# Patient Record
Sex: Male | Born: 1935
Health system: Southern US, Community
[De-identification: ages and names within clinical notes are randomized; demographics above are authoritative.]

## PROBLEM LIST (undated history)

## (undated) DIAGNOSIS — L0292 Furuncle, unspecified: Secondary | ICD-10-CM

## (undated) DIAGNOSIS — I509 Heart failure, unspecified: Secondary | ICD-10-CM

## (undated) DIAGNOSIS — D649 Anemia, unspecified: Secondary | ICD-10-CM

## (undated) DIAGNOSIS — H409 Unspecified glaucoma: Secondary | ICD-10-CM

## (undated) DIAGNOSIS — M795 Residual foreign body in soft tissue: Secondary | ICD-10-CM

## (undated) DIAGNOSIS — R739 Hyperglycemia, unspecified: Secondary | ICD-10-CM

## (undated) DIAGNOSIS — D702 Other drug-induced agranulocytosis: Secondary | ICD-10-CM

## (undated) DIAGNOSIS — N055 Unspecified nephritic syndrome with diffuse mesangiocapillary glomerulonephritis: Secondary | ICD-10-CM

## (undated) DIAGNOSIS — I10 Essential (primary) hypertension: Secondary | ICD-10-CM

## (undated) DIAGNOSIS — E039 Hypothyroidism, unspecified: Secondary | ICD-10-CM

## (undated) DIAGNOSIS — L0293 Carbuncle, unspecified: Secondary | ICD-10-CM

## (undated) DIAGNOSIS — E785 Hyperlipidemia, unspecified: Secondary | ICD-10-CM

## (undated) DIAGNOSIS — N183 Chronic kidney disease, stage 3 unspecified: Secondary | ICD-10-CM

## (undated) DIAGNOSIS — I2510 Atherosclerotic heart disease of native coronary artery without angina pectoris: Secondary | ICD-10-CM

## (undated) DIAGNOSIS — I776 Arteritis, unspecified: Secondary | ICD-10-CM

## (undated) DIAGNOSIS — K219 Gastro-esophageal reflux disease without esophagitis: Secondary | ICD-10-CM

## (undated) DIAGNOSIS — H209 Unspecified iridocyclitis: Secondary | ICD-10-CM

## (undated) DIAGNOSIS — I219 Acute myocardial infarction, unspecified: Secondary | ICD-10-CM

## (undated) DIAGNOSIS — J849 Interstitial pulmonary disease, unspecified: Secondary | ICD-10-CM

## (undated) DIAGNOSIS — J8489 Other specified interstitial pulmonary diseases: Secondary | ICD-10-CM

## (undated) DIAGNOSIS — C8330 Diffuse large B-cell lymphoma, unspecified site: Secondary | ICD-10-CM

## (undated) DIAGNOSIS — L089 Local infection of the skin and subcutaneous tissue, unspecified: Secondary | ICD-10-CM

## (undated) DIAGNOSIS — H269 Unspecified cataract: Secondary | ICD-10-CM

## (undated) DIAGNOSIS — K2270 Barrett's esophagus without dysplasia: Secondary | ICD-10-CM

## (undated) DIAGNOSIS — R319 Hematuria, unspecified: Secondary | ICD-10-CM

## (undated) HISTORY — DX: Atherosclerotic heart disease of native coronary artery without angina pectoris: I25.10

## (undated) HISTORY — DX: Diffuse large B-cell lymphoma, unspecified site: C83.30

## (undated) HISTORY — DX: Barrett's esophagus without dysplasia: K22.70

## (undated) HISTORY — DX: Other drug-induced agranulocytosis: D70.2

## (undated) HISTORY — DX: Unspecified nephritic syndrome with diffuse mesangiocapillary glomerulonephritis: N05.5

## (undated) HISTORY — DX: Anemia, unspecified: D64.9

## (undated) HISTORY — DX: Carbuncle, unspecified: L02.93

## (undated) HISTORY — PX: LUNG BIOPSY: SHX232

## (undated) HISTORY — PX: CATARACT EXTRACTION: SUR2

## (undated) HISTORY — PX: RENAL BIOPSY: SHX156

## (undated) HISTORY — DX: Unspecified cataract: H26.9

## (undated) HISTORY — DX: Arteritis, unspecified: I77.6

## (undated) HISTORY — DX: Hyperlipidemia, unspecified: E78.5

## (undated) HISTORY — DX: Essential (primary) hypertension: I10

## (undated) HISTORY — DX: Gastro-esophageal reflux disease without esophagitis: K21.9

## (undated) HISTORY — DX: Acute myocardial infarction, unspecified: I21.9

## (undated) HISTORY — DX: Hypothyroidism, unspecified: E03.9

## (undated) HISTORY — DX: Interstitial pulmonary disease, unspecified: J84.9

## (undated) HISTORY — DX: Local infection of the skin and subcutaneous tissue, unspecified: L08.9

## (undated) HISTORY — DX: Unspecified iridocyclitis: H20.9

## (undated) HISTORY — DX: Residual foreign body in soft tissue: M79.5

## (undated) HISTORY — DX: Furuncle, unspecified: L02.92

---

## 1994-12-06 ENCOUNTER — Encounter: Payer: Self-pay | Admitting: Family Medicine

## 2000-05-17 DIAGNOSIS — I219 Acute myocardial infarction, unspecified: Secondary | ICD-10-CM

## 2000-05-17 HISTORY — DX: Acute myocardial infarction, unspecified: I21.9

## 2000-05-17 HISTORY — PX: CORONARY ARTERY BYPASS GRAFT: SHX141

## 2000-05-22 ENCOUNTER — Inpatient Hospital Stay (HOSPITAL_COMMUNITY): Admission: EM | Admit: 2000-05-22 | Discharge: 2000-05-31 | Payer: Self-pay

## 2000-05-24 ENCOUNTER — Encounter: Payer: Self-pay | Admitting: Internal Medicine

## 2000-05-25 ENCOUNTER — Encounter: Payer: Self-pay | Admitting: Thoracic Surgery (Cardiothoracic Vascular Surgery)

## 2000-05-26 ENCOUNTER — Encounter: Payer: Self-pay | Admitting: Thoracic Surgery (Cardiothoracic Vascular Surgery)

## 2000-05-27 ENCOUNTER — Encounter: Payer: Self-pay | Admitting: Thoracic Surgery (Cardiothoracic Vascular Surgery)

## 2004-04-22 ENCOUNTER — Ambulatory Visit: Payer: Self-pay

## 2004-05-25 ENCOUNTER — Ambulatory Visit: Payer: Self-pay | Admitting: Cardiovascular Disease

## 2004-06-01 ENCOUNTER — Ambulatory Visit: Payer: Self-pay | Admitting: Cardiovascular Disease

## 2004-06-05 ENCOUNTER — Encounter: Admission: RE | Admit: 2004-06-05 | Discharge: 2004-06-05 | Payer: Self-pay | Admitting: Sports Medicine

## 2004-06-17 ENCOUNTER — Ambulatory Visit: Payer: Self-pay | Admitting: Family Medicine

## 2004-06-22 ENCOUNTER — Ambulatory Visit: Payer: Self-pay | Admitting: Family Medicine

## 2004-07-02 ENCOUNTER — Ambulatory Visit: Payer: Self-pay | Admitting: Family Medicine

## 2004-09-16 ENCOUNTER — Ambulatory Visit: Payer: Self-pay | Admitting: Family Medicine

## 2004-09-23 ENCOUNTER — Ambulatory Visit: Payer: Self-pay | Admitting: Family Medicine

## 2004-12-03 ENCOUNTER — Ambulatory Visit: Payer: Self-pay | Admitting: Cardiology

## 2005-06-04 ENCOUNTER — Ambulatory Visit: Payer: Self-pay | Admitting: Family Medicine

## 2005-06-11 ENCOUNTER — Ambulatory Visit: Payer: Self-pay | Admitting: Family Medicine

## 2005-09-08 ENCOUNTER — Ambulatory Visit: Payer: Self-pay | Admitting: Family Medicine

## 2006-05-26 ENCOUNTER — Ambulatory Visit: Payer: Self-pay | Admitting: Family Medicine

## 2006-06-02 ENCOUNTER — Ambulatory Visit: Payer: Self-pay | Admitting: Family Medicine

## 2006-06-02 LAB — CONVERTED CEMR LAB
ALT: 31 units/L (ref 0–40)
AST: 22 units/L (ref 0–37)
Alkaline Phosphatase: 62 units/L (ref 39–117)
Cholesterol: 153 mg/dL (ref 0–200)
Creatinine, Ser: 0.9 mg/dL (ref 0.4–1.5)
Glucose, Bld: 133 mg/dL — ABNORMAL HIGH (ref 70–99)
HCT: 46.9 % (ref 39.0–52.0)
Monocytes Relative: 8.1 % (ref 3.0–11.0)
Neutro Abs: 7.4 10*3/uL (ref 1.4–7.7)
Neutrophils Relative %: 73.7 % (ref 43.0–77.0)
RBC: 4.93 M/uL (ref 4.22–5.81)
Sodium: 139 meq/L (ref 135–145)
TSH: 2.73 microintl units/mL (ref 0.35–5.50)
WBC: 10 10*3/uL (ref 4.5–10.5)

## 2006-06-13 ENCOUNTER — Ambulatory Visit: Payer: Self-pay | Admitting: Family Medicine

## 2006-07-29 ENCOUNTER — Ambulatory Visit: Payer: Self-pay | Admitting: Cardiovascular Disease

## 2006-09-05 ENCOUNTER — Encounter: Admission: RE | Admit: 2006-09-05 | Discharge: 2006-09-05 | Payer: Self-pay | Admitting: Family Medicine

## 2006-09-05 ENCOUNTER — Ambulatory Visit: Payer: Self-pay | Admitting: Family Medicine

## 2006-09-07 ENCOUNTER — Ambulatory Visit: Payer: Self-pay | Admitting: Family Medicine

## 2006-09-07 LAB — CONVERTED CEMR LAB: Hgb A1c MFr Bld: 6 % (ref 4.6–6.0)

## 2006-09-26 ENCOUNTER — Ambulatory Visit: Payer: Self-pay | Admitting: Family Medicine

## 2006-09-26 ENCOUNTER — Ambulatory Visit: Payer: Self-pay | Admitting: Internal Medicine

## 2006-09-26 LAB — CONVERTED CEMR LAB: BUN: 23 mg/dL (ref 6–23)

## 2006-09-27 ENCOUNTER — Ambulatory Visit: Payer: Self-pay | Admitting: Critical Care Medicine

## 2006-09-27 LAB — CONVERTED CEMR LAB
Neutrophil cytoplasmic antibodies,IgG,serum: 1:320 {titer} — ABNORMAL HIGH
Rhuematoid fact SerPl-aCnc: <20 intl units/mL — ABNORMAL LOW (ref 0.0–20.0)

## 2006-10-03 ENCOUNTER — Encounter: Payer: Self-pay | Admitting: Critical Care Medicine

## 2006-10-03 ENCOUNTER — Ambulatory Visit: Payer: Self-pay | Admitting: Thoracic Surgery (Cardiothoracic Vascular Surgery)

## 2006-10-13 ENCOUNTER — Inpatient Hospital Stay (HOSPITAL_COMMUNITY)
Admission: RE | Admit: 2006-10-13 | Discharge: 2006-10-17 | Payer: Self-pay | Admitting: Thoracic Surgery (Cardiothoracic Vascular Surgery)

## 2006-10-13 ENCOUNTER — Encounter: Payer: Self-pay | Admitting: Thoracic Surgery (Cardiothoracic Vascular Surgery)

## 2006-10-13 ENCOUNTER — Ambulatory Visit: Payer: Self-pay | Admitting: Critical Care Medicine

## 2006-10-13 ENCOUNTER — Ambulatory Visit: Payer: Self-pay | Admitting: Thoracic Surgery (Cardiothoracic Vascular Surgery)

## 2006-10-21 ENCOUNTER — Ambulatory Visit: Payer: Self-pay | Admitting: Critical Care Medicine

## 2006-10-28 ENCOUNTER — Encounter: Payer: Self-pay | Admitting: Internal Medicine

## 2006-10-28 ENCOUNTER — Ambulatory Visit: Payer: Self-pay | Admitting: Cardiothoracic Surgery

## 2006-10-28 ENCOUNTER — Encounter
Admission: RE | Admit: 2006-10-28 | Discharge: 2006-10-28 | Payer: Self-pay | Admitting: Thoracic Surgery (Cardiothoracic Vascular Surgery)

## 2006-11-08 ENCOUNTER — Ambulatory Visit: Payer: Self-pay | Admitting: Pulmonary Disease

## 2006-11-09 ENCOUNTER — Ambulatory Visit: Payer: Self-pay | Admitting: Family Medicine

## 2006-11-09 ENCOUNTER — Encounter: Payer: Self-pay | Admitting: Internal Medicine

## 2006-11-09 ENCOUNTER — Inpatient Hospital Stay (HOSPITAL_COMMUNITY): Admission: EM | Admit: 2006-11-09 | Discharge: 2006-11-13 | Payer: Self-pay | Admitting: Emergency Medicine

## 2006-11-09 LAB — CONVERTED CEMR LAB
Alkaline Phosphatase: 43 units/L (ref 39–117)
BUN: 96 mg/dL (ref 6–23)
Basophils Absolute: 0 10*3/uL (ref 0.0–0.1)
Basophils Relative: 0.1 % (ref 0.0–1.0)
Bilirubin, Direct: 0.1 mg/dL (ref 0.0–0.3)
CO2: 26 meq/L (ref 19–32)
Calcium: 9 mg/dL (ref 8.4–10.5)
Eosinophils Relative: 0.5 % (ref 0.0–5.0)
GFR calc non Af Amer: 18 mL/min
HCT: 33.9 % — ABNORMAL LOW (ref 39.0–52.0)
Hemoglobin: 11.3 g/dL — ABNORMAL LOW (ref 13.0–17.0)
Hgb A1c MFr Bld: 7 % — ABNORMAL HIGH (ref 4.6–6.0)
Lymphocytes Relative: 11.9 % — ABNORMAL LOW (ref 12.0–46.0)
Neutro Abs: 13.6 10*3/uL — ABNORMAL HIGH (ref 1.4–7.7)
Platelets: 232 10*3/uL (ref 150–400)
RDW: 14.7 % — ABNORMAL HIGH (ref 11.5–14.6)
Total Bilirubin: 1 mg/dL (ref 0.3–1.2)

## 2006-11-10 ENCOUNTER — Encounter: Payer: Self-pay | Admitting: Internal Medicine

## 2006-11-10 ENCOUNTER — Ambulatory Visit: Payer: Self-pay | Admitting: Internal Medicine

## 2006-11-16 ENCOUNTER — Ambulatory Visit: Payer: Self-pay | Admitting: Family Medicine

## 2006-11-16 ENCOUNTER — Encounter: Payer: Self-pay | Admitting: Internal Medicine

## 2006-11-16 LAB — CONVERTED CEMR LAB
ALT: 40 units/L (ref 0–53)
AST: 23 units/L (ref 0–37)
Albumin: 3 g/dL — ABNORMAL LOW (ref 3.5–5.2)
BUN: 64 mg/dL — ABNORMAL HIGH (ref 6–23)
Basophils Absolute: 0 10*3/uL (ref 0.0–0.1)
Calcium: 8.9 mg/dL (ref 8.4–10.5)
Chloride: 108 meq/L (ref 96–112)
Cholesterol: 191 mg/dL (ref 0–200)
Eosinophils Absolute: 0.1 10*3/uL (ref 0.0–0.6)
GFR calc Af Amer: 28 mL/min
GFR calc non Af Amer: 23 mL/min
HCT: 31.8 % — ABNORMAL LOW (ref 39.0–52.0)
Hemoglobin: 10.7 g/dL — ABNORMAL LOW (ref 13.0–17.0)
LDL Cholesterol: 131 mg/dL — ABNORMAL HIGH (ref 0–99)
Lymphocytes Relative: 13 % (ref 12.0–46.0)
MCHC: 33.5 g/dL (ref 30.0–36.0)
MCV: 89.4 fL (ref 78.0–100.0)
Monocytes Relative: 7.6 % (ref 3.0–11.0)
Platelets: 152 10*3/uL (ref 150–400)
RBC: 3.56 M/uL — ABNORMAL LOW (ref 4.22–5.81)
VLDL: 18 mg/dL (ref 0–40)

## 2006-11-30 DIAGNOSIS — E039 Hypothyroidism, unspecified: Secondary | ICD-10-CM | POA: Insufficient documentation

## 2006-11-30 DIAGNOSIS — E119 Type 2 diabetes mellitus without complications: Secondary | ICD-10-CM

## 2006-12-05 ENCOUNTER — Ambulatory Visit: Payer: Self-pay | Admitting: Critical Care Medicine

## 2006-12-08 ENCOUNTER — Encounter (INDEPENDENT_AMBULATORY_CARE_PROVIDER_SITE_OTHER): Payer: Self-pay | Admitting: Nephrology

## 2006-12-08 ENCOUNTER — Ambulatory Visit (HOSPITAL_COMMUNITY): Admission: RE | Admit: 2006-12-08 | Discharge: 2006-12-09 | Payer: Self-pay | Admitting: Nephrology

## 2006-12-28 ENCOUNTER — Telehealth: Payer: Self-pay | Admitting: Family Medicine

## 2007-01-05 ENCOUNTER — Ambulatory Visit: Payer: Self-pay | Admitting: Internal Medicine

## 2007-01-19 ENCOUNTER — Encounter: Payer: Self-pay | Admitting: Family Medicine

## 2007-01-26 ENCOUNTER — Ambulatory Visit: Payer: Self-pay | Admitting: Family Medicine

## 2007-01-26 DIAGNOSIS — G2581 Restless legs syndrome: Secondary | ICD-10-CM

## 2007-01-26 DIAGNOSIS — N035 Chronic nephritic syndrome with diffuse mesangiocapillary glomerulonephritis: Secondary | ICD-10-CM

## 2007-01-27 ENCOUNTER — Telehealth: Payer: Self-pay | Admitting: Family Medicine

## 2007-02-02 ENCOUNTER — Ambulatory Visit: Payer: Self-pay | Admitting: Critical Care Medicine

## 2007-02-05 DIAGNOSIS — J8410 Pulmonary fibrosis, unspecified: Secondary | ICD-10-CM | POA: Insufficient documentation

## 2007-02-23 ENCOUNTER — Encounter: Payer: Self-pay | Admitting: Family Medicine

## 2007-02-27 ENCOUNTER — Telehealth (INDEPENDENT_AMBULATORY_CARE_PROVIDER_SITE_OTHER): Payer: Self-pay | Admitting: *Deleted

## 2007-03-02 ENCOUNTER — Ambulatory Visit: Payer: Self-pay | Admitting: Family Medicine

## 2007-03-02 ENCOUNTER — Telehealth (INDEPENDENT_AMBULATORY_CARE_PROVIDER_SITE_OTHER): Payer: Self-pay | Admitting: *Deleted

## 2007-04-18 ENCOUNTER — Ambulatory Visit: Payer: Self-pay | Admitting: Critical Care Medicine

## 2007-04-19 ENCOUNTER — Encounter: Payer: Self-pay | Admitting: Critical Care Medicine

## 2007-04-24 ENCOUNTER — Ambulatory Visit: Payer: Self-pay | Admitting: Family Medicine

## 2007-04-24 DIAGNOSIS — M795 Residual foreign body in soft tissue: Secondary | ICD-10-CM

## 2007-05-08 ENCOUNTER — Encounter: Payer: Self-pay | Admitting: Family Medicine

## 2007-05-08 ENCOUNTER — Encounter: Payer: Self-pay | Admitting: Critical Care Medicine

## 2007-05-18 HISTORY — PX: TOE AMPUTATION: SHX809

## 2007-05-23 ENCOUNTER — Ambulatory Visit: Payer: Self-pay | Admitting: Family Medicine

## 2007-05-23 DIAGNOSIS — I10 Essential (primary) hypertension: Secondary | ICD-10-CM

## 2007-05-24 ENCOUNTER — Telehealth (INDEPENDENT_AMBULATORY_CARE_PROVIDER_SITE_OTHER): Payer: Self-pay | Admitting: *Deleted

## 2007-05-25 ENCOUNTER — Telehealth: Payer: Self-pay | Admitting: Family Medicine

## 2007-06-08 ENCOUNTER — Encounter: Payer: Self-pay | Admitting: Critical Care Medicine

## 2007-06-20 ENCOUNTER — Ambulatory Visit: Payer: Self-pay | Admitting: Gastroenterology

## 2007-06-27 ENCOUNTER — Telehealth (INDEPENDENT_AMBULATORY_CARE_PROVIDER_SITE_OTHER): Payer: Self-pay | Admitting: *Deleted

## 2007-07-20 ENCOUNTER — Ambulatory Visit: Payer: Self-pay | Admitting: Family Medicine

## 2007-07-20 DIAGNOSIS — E785 Hyperlipidemia, unspecified: Secondary | ICD-10-CM

## 2007-07-20 LAB — CONVERTED CEMR LAB
Cholesterol: 130 mg/dL (ref 0–200)
HDL: 33.5 mg/dL — ABNORMAL LOW (ref >39.0)
LDL Cholesterol: 78 mg/dL (ref 0–99)

## 2007-08-03 ENCOUNTER — Encounter: Payer: Self-pay | Admitting: Critical Care Medicine

## 2007-08-08 ENCOUNTER — Ambulatory Visit: Payer: Self-pay

## 2007-08-08 ENCOUNTER — Ambulatory Visit: Payer: Self-pay | Admitting: Cardiovascular Disease

## 2007-10-30 ENCOUNTER — Ambulatory Visit: Payer: Self-pay | Admitting: Internal Medicine

## 2007-10-30 ENCOUNTER — Telehealth (INDEPENDENT_AMBULATORY_CARE_PROVIDER_SITE_OTHER): Payer: Self-pay | Admitting: *Deleted

## 2007-11-03 ENCOUNTER — Encounter: Payer: Self-pay | Admitting: Critical Care Medicine

## 2007-11-03 ENCOUNTER — Ambulatory Visit: Payer: Self-pay | Admitting: Internal Medicine

## 2007-11-06 ENCOUNTER — Ambulatory Visit: Payer: Self-pay | Admitting: Internal Medicine

## 2007-11-06 ENCOUNTER — Telehealth (INDEPENDENT_AMBULATORY_CARE_PROVIDER_SITE_OTHER): Payer: Self-pay | Admitting: *Deleted

## 2007-11-06 LAB — CONVERTED CEMR LAB
ALT: 19 units/L (ref 0–53)
Alkaline Phosphatase: 61 units/L (ref 39–117)
BUN: 33 mg/dL — ABNORMAL HIGH (ref 6–23)
Bilirubin, Direct: 0.2 mg/dL (ref 0.0–0.3)
Chloride: 104 meq/L (ref 96–112)
Creatinine, Ser: 2.4 mg/dL — ABNORMAL HIGH (ref 0.4–1.5)
Crystals: NEGATIVE
Glucose, Bld: 121 mg/dL — ABNORMAL HIGH (ref 70–99)
Ketones, ur: NEGATIVE mg/dL
Mucus, UA: NEGATIVE
Phosphorus: 4.5 mg/dL (ref 2.3–4.6)
Potassium: 4.1 meq/L (ref 3.5–5.1)
Specific Gravity, Urine: 1.02 (ref 1.000–1.03)
Total Bilirubin: 1.2 mg/dL (ref 0.3–1.2)
Total Protein, Urine: 100 mg/dL — AB
Urine Glucose: NEGATIVE mg/dL
Urobilinogen, UA: 0.2 (ref 0.0–1.0)
pH: 6 (ref 5.0–8.0)

## 2007-11-07 ENCOUNTER — Telehealth: Payer: Self-pay | Admitting: Internal Medicine

## 2007-11-07 ENCOUNTER — Telehealth: Payer: Self-pay | Admitting: Critical Care Medicine

## 2007-11-08 ENCOUNTER — Encounter: Payer: Self-pay | Admitting: Critical Care Medicine

## 2007-11-13 ENCOUNTER — Ambulatory Visit: Payer: Self-pay | Admitting: Critical Care Medicine

## 2007-11-14 ENCOUNTER — Telehealth (INDEPENDENT_AMBULATORY_CARE_PROVIDER_SITE_OTHER): Payer: Self-pay | Admitting: *Deleted

## 2007-11-14 ENCOUNTER — Encounter: Payer: Self-pay | Admitting: Critical Care Medicine

## 2007-11-15 ENCOUNTER — Ambulatory Visit (HOSPITAL_COMMUNITY): Admission: RE | Admit: 2007-11-15 | Discharge: 2007-11-15 | Payer: Self-pay | Admitting: Nephrology

## 2007-11-15 ENCOUNTER — Encounter (HOSPITAL_COMMUNITY): Admission: RE | Admit: 2007-11-15 | Discharge: 2008-01-17 | Payer: Self-pay | Admitting: Nephrology

## 2007-11-16 ENCOUNTER — Telehealth: Payer: Self-pay | Admitting: Critical Care Medicine

## 2007-11-28 ENCOUNTER — Encounter: Payer: Self-pay | Admitting: Critical Care Medicine

## 2007-11-29 ENCOUNTER — Ambulatory Visit (HOSPITAL_COMMUNITY): Admission: RE | Admit: 2007-11-29 | Discharge: 2007-11-29 | Payer: Self-pay | Admitting: Nephrology

## 2007-12-07 ENCOUNTER — Ambulatory Visit: Payer: Self-pay | Admitting: Critical Care Medicine

## 2007-12-08 LAB — CONVERTED CEMR LAB
BUN: 50 mg/dL — ABNORMAL HIGH (ref 6–23)
Basophils Relative: 0.3 % (ref 0.0–3.0)
Calcium: 8.4 mg/dL (ref 8.4–10.5)
Creatinine, Ser: 2.2 mg/dL — ABNORMAL HIGH (ref 0.4–1.5)
Eosinophils Absolute: 0 10*3/uL (ref 0.0–0.7)
Eosinophils Relative: 1.2 % (ref 0.0–5.0)
GFR calc Af Amer: 38 mL/min
GFR calc non Af Amer: 32 mL/min
HCT: 25.8 % — ABNORMAL LOW (ref 39.0–52.0)
MCHC: 34.4 g/dL (ref 30.0–36.0)
MCV: 109.8 fL — ABNORMAL HIGH (ref 78.0–100.0)
Monocytes Absolute: 0.1 10*3/uL (ref 0.1–1.0)
RBC: 2.35 M/uL — ABNORMAL LOW (ref 4.22–5.81)
WBC: 1.5 10*3/uL — CL (ref 4.5–10.5)

## 2007-12-11 ENCOUNTER — Encounter: Payer: Self-pay | Admitting: Critical Care Medicine

## 2007-12-12 ENCOUNTER — Encounter: Payer: Self-pay | Admitting: Critical Care Medicine

## 2007-12-15 ENCOUNTER — Encounter: Payer: Self-pay | Admitting: Critical Care Medicine

## 2007-12-26 ENCOUNTER — Encounter: Payer: Self-pay | Admitting: Critical Care Medicine

## 2007-12-27 ENCOUNTER — Ambulatory Visit: Payer: Self-pay | Admitting: Critical Care Medicine

## 2007-12-27 ENCOUNTER — Encounter: Payer: Self-pay | Admitting: Critical Care Medicine

## 2007-12-29 ENCOUNTER — Encounter: Payer: Self-pay | Admitting: Critical Care Medicine

## 2008-01-01 ENCOUNTER — Encounter: Payer: Self-pay | Admitting: Critical Care Medicine

## 2008-01-11 ENCOUNTER — Ambulatory Visit: Payer: Self-pay | Admitting: Critical Care Medicine

## 2008-01-29 ENCOUNTER — Encounter: Payer: Self-pay | Admitting: Critical Care Medicine

## 2008-02-28 ENCOUNTER — Encounter: Payer: Self-pay | Admitting: Critical Care Medicine

## 2008-03-04 ENCOUNTER — Ambulatory Visit: Payer: Self-pay | Admitting: Critical Care Medicine

## 2008-03-22 ENCOUNTER — Telehealth: Payer: Self-pay | Admitting: Gastroenterology

## 2008-03-22 ENCOUNTER — Encounter: Payer: Self-pay | Admitting: Physician Assistant

## 2008-03-22 ENCOUNTER — Encounter: Admission: RE | Admit: 2008-03-22 | Discharge: 2008-03-22 | Payer: Self-pay | Admitting: Nephrology

## 2008-03-25 ENCOUNTER — Telehealth: Payer: Self-pay | Admitting: Gastroenterology

## 2008-03-27 ENCOUNTER — Ambulatory Visit: Payer: Self-pay | Admitting: Gastroenterology

## 2008-03-28 ENCOUNTER — Encounter: Payer: Self-pay | Admitting: Gastroenterology

## 2008-03-28 ENCOUNTER — Ambulatory Visit: Payer: Self-pay | Admitting: Gastroenterology

## 2008-03-28 ENCOUNTER — Ambulatory Visit (HOSPITAL_COMMUNITY): Admission: RE | Admit: 2008-03-28 | Discharge: 2008-03-28 | Payer: Self-pay | Admitting: Gastroenterology

## 2008-04-01 ENCOUNTER — Ambulatory Visit: Payer: Self-pay | Admitting: Family Medicine

## 2008-04-01 ENCOUNTER — Encounter: Payer: Self-pay | Admitting: Gastroenterology

## 2008-04-02 ENCOUNTER — Encounter: Payer: Self-pay | Admitting: Family Medicine

## 2008-04-04 ENCOUNTER — Encounter: Payer: Self-pay | Admitting: Internal Medicine

## 2008-04-04 ENCOUNTER — Telehealth: Payer: Self-pay | Admitting: Family Medicine

## 2008-04-10 ENCOUNTER — Encounter: Payer: Self-pay | Admitting: Internal Medicine

## 2008-04-15 ENCOUNTER — Ambulatory Visit: Payer: Self-pay | Admitting: Gastroenterology

## 2008-04-15 DIAGNOSIS — K219 Gastro-esophageal reflux disease without esophagitis: Secondary | ICD-10-CM

## 2008-04-15 DIAGNOSIS — K2270 Barrett's esophagus without dysplasia: Secondary | ICD-10-CM

## 2008-05-07 ENCOUNTER — Encounter: Payer: Self-pay | Admitting: Gastroenterology

## 2008-05-24 ENCOUNTER — Telehealth: Payer: Self-pay | Admitting: Family Medicine

## 2008-06-05 ENCOUNTER — Encounter: Payer: Self-pay | Admitting: Gastroenterology

## 2008-06-10 ENCOUNTER — Encounter: Payer: Self-pay | Admitting: Gastroenterology

## 2008-07-17 ENCOUNTER — Encounter: Payer: Self-pay | Admitting: Gastroenterology

## 2008-07-23 ENCOUNTER — Encounter: Payer: Self-pay | Admitting: Cardiovascular Disease

## 2008-07-23 ENCOUNTER — Ambulatory Visit: Payer: Self-pay | Admitting: Cardiovascular Disease

## 2008-07-23 DIAGNOSIS — I2510 Atherosclerotic heart disease of native coronary artery without angina pectoris: Secondary | ICD-10-CM

## 2008-07-30 ENCOUNTER — Encounter: Payer: Self-pay | Admitting: Gastroenterology

## 2008-09-09 ENCOUNTER — Encounter: Payer: Self-pay | Admitting: Gastroenterology

## 2008-09-09 ENCOUNTER — Encounter: Payer: Self-pay | Admitting: Internal Medicine

## 2008-10-23 ENCOUNTER — Encounter: Payer: Self-pay | Admitting: Critical Care Medicine

## 2008-10-31 ENCOUNTER — Encounter: Payer: Self-pay | Admitting: Critical Care Medicine

## 2008-12-04 DIAGNOSIS — H209 Unspecified iridocyclitis: Secondary | ICD-10-CM

## 2008-12-04 DIAGNOSIS — D649 Anemia, unspecified: Secondary | ICD-10-CM

## 2008-12-04 DIAGNOSIS — J218 Acute bronchiolitis due to other specified organisms: Secondary | ICD-10-CM | POA: Insufficient documentation

## 2008-12-04 DIAGNOSIS — E7800 Pure hypercholesterolemia, unspecified: Secondary | ICD-10-CM | POA: Insufficient documentation

## 2008-12-04 DIAGNOSIS — N259 Disorder resulting from impaired renal tubular function, unspecified: Secondary | ICD-10-CM | POA: Insufficient documentation

## 2008-12-05 ENCOUNTER — Ambulatory Visit: Payer: Self-pay | Admitting: Cardiovascular Disease

## 2008-12-09 ENCOUNTER — Encounter: Payer: Self-pay | Admitting: Critical Care Medicine

## 2008-12-31 ENCOUNTER — Ambulatory Visit: Payer: Self-pay | Admitting: Critical Care Medicine

## 2009-01-06 ENCOUNTER — Encounter: Payer: Self-pay | Admitting: Critical Care Medicine

## 2009-02-03 ENCOUNTER — Ambulatory Visit: Payer: Self-pay | Admitting: Critical Care Medicine

## 2009-02-05 ENCOUNTER — Encounter: Payer: Self-pay | Admitting: Critical Care Medicine

## 2009-02-18 ENCOUNTER — Encounter (INDEPENDENT_AMBULATORY_CARE_PROVIDER_SITE_OTHER): Payer: Self-pay | Admitting: *Deleted

## 2009-03-05 ENCOUNTER — Encounter: Payer: Self-pay | Admitting: Critical Care Medicine

## 2009-03-12 ENCOUNTER — Encounter: Payer: Self-pay | Admitting: Critical Care Medicine

## 2009-04-02 ENCOUNTER — Encounter: Payer: Self-pay | Admitting: Critical Care Medicine

## 2009-04-09 ENCOUNTER — Encounter: Payer: Self-pay | Admitting: Critical Care Medicine

## 2009-05-19 ENCOUNTER — Ambulatory Visit: Payer: Self-pay | Admitting: Cardiovascular Disease

## 2009-06-05 ENCOUNTER — Encounter: Payer: Self-pay | Admitting: Cardiovascular Disease

## 2009-06-23 ENCOUNTER — Encounter: Payer: Self-pay | Admitting: Gastroenterology

## 2009-06-23 ENCOUNTER — Encounter: Payer: Self-pay | Admitting: Critical Care Medicine

## 2009-08-12 ENCOUNTER — Telehealth (INDEPENDENT_AMBULATORY_CARE_PROVIDER_SITE_OTHER): Payer: Self-pay | Admitting: *Deleted

## 2009-11-21 ENCOUNTER — Ambulatory Visit: Payer: Self-pay | Admitting: Cardiovascular Disease

## 2009-11-27 ENCOUNTER — Telehealth: Payer: Self-pay | Admitting: Gastroenterology

## 2010-01-27 ENCOUNTER — Ambulatory Visit: Payer: Self-pay | Admitting: Gastroenterology

## 2010-01-27 ENCOUNTER — Encounter (INDEPENDENT_AMBULATORY_CARE_PROVIDER_SITE_OTHER): Payer: Self-pay | Admitting: *Deleted

## 2010-05-26 ENCOUNTER — Ambulatory Visit
Admission: RE | Admit: 2010-05-26 | Discharge: 2010-05-26 | Payer: Self-pay | Source: Home / Self Care | Attending: Cardiovascular Disease | Admitting: Cardiovascular Disease

## 2010-05-26 ENCOUNTER — Encounter: Payer: Self-pay | Admitting: Cardiovascular Disease

## 2010-06-14 LAB — CONVERTED CEMR LAB
ALT: 21 units/L (ref 0–53)
AST: 25 units/L (ref 0–37)
Alkaline Phosphatase: 58 units/L (ref 39–117)
BUN: 23 mg/dL (ref 6–23)
Basophils Relative: 0.7 % (ref 0.0–1.0)
Bilirubin, Direct: 0.2 mg/dL (ref 0.0–0.3)
Calcium: 9.2 mg/dL (ref 8.4–10.5)
Chloride: 102 meq/L (ref 96–112)
Cholesterol: 280 mg/dL (ref 0–200)
Direct LDL: 221.7 mg/dL
Eosinophils Absolute: 0.1 10*3/uL (ref 0.0–0.6)
Eosinophils Relative: 2.3 % (ref 0.0–5.0)
GFR calc Af Amer: 60 mL/min
GFR calc non Af Amer: 49 mL/min
Glucose, Bld: 89 mg/dL (ref 70–99)
HDL: 43.9 mg/dL (ref >39.0)
MCV: 115.6 fL — ABNORMAL HIGH (ref 78.0–100.0)
Microalb Creat Ratio: 202.2 mg/g — ABNORMAL HIGH (ref 0.0–30.0)
Monocytes Relative: 15.6 % — ABNORMAL HIGH (ref 3.0–11.0)
Neutro Abs: 3.7 10*3/uL (ref 1.4–7.7)
PSA: 3.25 ng/mL (ref 0.10–4.00)
Platelets: 185 10*3/uL (ref 150–400)
RBC: 3.06 M/uL — ABNORMAL LOW (ref 4.22–5.81)
TSH: 1.62 microintl units/mL (ref 0.35–5.50)
Triglycerides: 108 mg/dL (ref 0–149)
VLDL: 22 mg/dL (ref 0–40)
WBC: 4.9 10*3/uL (ref 4.5–10.5)

## 2010-06-16 NOTE — Assessment & Plan Note (Signed)
Summary: f38m/dm   Referring Provider:  Lissa Hoard Renal Primary Provider:  Stevie Kern, MD  CC:  6 month check up.  History of Present Illness: Luke Mcbride is seen today in followup for his coronary artery disease, hypertension, and dyspnea.  He is S/P coronary bypass surgery in 2002.  His last Myoview study was done 3/24/ 2009.  He had a high lateral infarct but no ischemia and an EF of 47%.  He is back on baby aspirin.  He has had significant vasculitis involving his lugns and kidneys.    [name]  His dyspnea seems to be improving he is coughing less.  He had significant groundglass appearance to his lungs on CT scan.  Kidney function appears to have stabilized with a creatinine in the mid-2 range. He tells me it is down to 1.6 range now.    He was on Cytoxan for a while.  Feels much better on Imuran.  Not having any significant chest pain PND orthopnea he has trace lower extremity edema.  No diaphoresis or syncope.  Has had some visual problems with his eyes and is now off Diamox.  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  He sees Dr Edilia Bo at [place].  Current Problems (verified): 1)  Dyspnea  (ICD-786.05) 2)  Essential Hypertension, Benign  (ICD-401.1) 3)  Cad  (ICD-414.00) 4)  Hyperlipidemia  (ICD-272.4) 5)  Bronchiolitis  (ICD-466.19) 6)  Renal Insufficiency  (ICD-588.9) 7)  Iritis  (ICD-364.3) 8)  Hypercholesterolemia  (ICD-272.0) 9)  Gerd  (ICD-530.81) 10)  Barrett's Esophagus  (ICD-530.85) 11)  Unspec Local Infection Skin&subcutaneous Tissue  (ICD-686.9) 12)  Boils, Recurrent  (ICD-680.9) 13)  Abdominal Pain-epigastric  (ICD-789.06) 14)  Nausea  (ICD-787.02) 15)  Epigastric Pain  (ICD-789.06) 16)  Neutropenia, Drug-induced  (ICD-288.03) 17)  Vasculitis  (ICD-447.6) 18)  Family History Diabetes 1st Degree Relative  (ICD-V18.0) 76)  Family History Depression  (ICD-V17.0) 20)  Foreign Body, Soft Tissue, Residual   (ICD-729.6) 21)  Interstitial Lung Disease  (ICD-515) 22)  Restless Leg Syndrome, Severe  (ICD-333.94) 23)  Nephritis, Membranoproliferative  (ICD-582.2) 24)  Hypothyroidism  (ICD-244.9) 25)  Diabetes Mellitus, Type II  (ICD-250.00) 26)  Allergic Rhinitis  (ICD-477.9) 27)  Anemia  (ICD-285.9)  Current Medications (verified): 1)  Procrit 10000 Unit/ml  Soln (Epoetin Alfa) .... Monthly 2)  Nitrostat 0.4 Mg  Subl (Nitroglycerin) .... One Sl Prn 3)  Micardis 20 Mg Tabs (Telmisartan) .[name].. 1 Tab By Mouth Once Daily 4)  Azathioprine 50 Mg Tabs (Azathioprine) .... 2 Morning 1 Afternoon 5)  Zegerid 20-1100 Mg Caps (Omeprazole-Sodium Bicarbonate) .[name].. 1 Tab By Mouth Once Daily As Needed 6)  Multivitamins   Tabs (Multiple Vitamin) .[name].. 1 Tab By Mouth Once Daily 7)  Levoxyl 100 Mcg Tabs (Levothyroxine Sodium) .[name].. 1 By Mouth Daily 8)  Aspirin 81 Mg Tbec (Aspirin) .... Take One Tablet By Mouth Daily  Allergies (verified): No Known Drug Allergies  Past History:  Past Medical History: Last updated: 12/04/2008 Current Problems:  ESSENTIAL HYPERTENSION, BENIGN (ICD-401.1) CAD (ICD-414.00) HYPERLIPIDEMIA (ICD-272.4) BRONCHIOLITIS (ICD-466.19) RENAL INSUFFICIENCY (ICD-588.9) IRITIS (ICD-364.3) HYPERCHOLESTEROLEMIA (ICD-272.0) GERD (ICD-530.81) BARRETT'S ESOPHAGUS (ICD-530.85) UNSPEC LOCAL INFECTION SKIN&SUBCUTANEOUS TISSUE (ICD-686.9) BOILS, RECURRENT (ICD-680.9) ABDOMINAL PAIN-EPIGASTRIC (ICD-789.06) NAUSEA (ICD-787.02) EPIGASTRIC PAIN (ICD-789.06) NEUTROPENIA, DRUG-INDUCED (ICD-288.03) VASCULITIS (ICD-447.6) FAMILY HISTORY DIABETES 1ST DEGREE RELATIVE (ICD-V18.0) FAMILY HISTORY DEPRESSION (ICD-V17.0) FOREIGN BODY, SOFT TISSUE, RESIDUAL (ICD-729.6) RESTLESS LEG SYNDROME, SEVERE (ICD-333.94) NEPHRITIS, MEMBRANOPROLIFERATIVE (ICD-582.2) HYPOTHYROIDISM (ICD-244.9) DIABETES MELLITUS, TYPE  II (ICD-250.00) ALLERGIC RHINITIS (ICD-477.9) ANEMIA (ICD-285.9) INTERSTITIAL LUNG DISEASE  (ICD-515):         - 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 Married Never Smoked Alcohol use-yes Drug use-no Regular exercise-yes  Review of Systems       Denies fever, malais, weight loss,  decreased visual acuity, cough, sputum, SOB, hemoptysis, pleuritic pain, palpitaitons, heartburn, abdominal pain, melena, lower extremity edema, claudication, or rash. All other systems reviewed and negative except as noted in HPI  Vital Signs:  Patient profile:   75 year old male Height:      72 inches Weight:      236 pounds BMI:     32.12 Pulse rate:   70 / minute Resp:     12 per minute BP sitting:   124 / 82  (left arm)  Vitals Entered By: Burnett Kanaris (May 19, 2009 9:06 AM)  Physical Exam  General:  Affect appropriate Healthy:  appears stated age 65: normal Neck supple with no adenopathy JVP normal no bruits no thyromegaly Lungs clear with no wheezing and good diaphragmatic motion Heart:  S1/S2 no murmur,rub, gallop or click PMI normal Abdomen: benighn, BS positve, no tenderness, no AAA no bruit.  No HSM or HJR Distal pulses intact with no bruits No edema Neuro non-focal Skin warm and dry Ptosis left eye   Impression & Recommendations:  Problem # 1:  CAD (ICD-414.00) Cabg in 2002 non-ischemic myovue in 2009.  Medical Rx His updated medication list for this problem includes:    Nitrostat 0.4 Mg Subl (Nitroglycerin) ..... One sl prn    Aspirin 81 Mg Tbec (Aspirin) .[name]... Take one tablet by mouth  daily  Problem # 2:  DYSPNEA (ICD-786.05) Improved.  F/U Dr Joya Gaskins for vasculitic lung disease.  Clear exam today The following medications were removed from the medication list:    Acetazolamide Sodium 500 Mg Solr (Acetazolamide sodium) .[name]... 1 tab by mouth two times a day His updated medication list for this problem includes:    Micardis 20 Mg Tabs (Telmisartan) .[name]... 1 tab by mouth once daily    Aspirin 81 Mg Tbec (Aspirin) .[name]... Take one tablet by mouth daily  Problem # 3:  HYPERLIPIDEMIA (ICD-272.4) Check labs at renal office.  Likely restart niacin and lipitor.  LDL was 76 on statin previously.  Problem # 4:  ESSENTIAL HYPERTENSION, BENIGN (ICD-401.1) Well controlled continue meds including ARB The following medications were removed from the medication list:    Acetazolamide Sodium 500 Mg Solr (Acetazolamide sodium) .[name]... 1 tab by mouth two times a day His updated medication list for this problem includes:    Micardis 20 Mg Tabs (Telmisartan) .[name]... 1 tab by mouth once daily    Aspirin 81 Mg Tbec (Aspirin) .[name]... Take one tablet by mouth daily  Patient Instructions: 1)  Your physician recommends that you schedule a follow-up appointment in: 6 months

## 2010-06-16 NOTE — Progress Notes (Signed)
Summary: Schedule Office Visit  ---- Converted from flag ---- ---- 11/27/2009 7:29 AM, Milus Banister MD wrote: yes, rov first  ---- 11/26/2009 3:40 PM, Bernita Buffy CMA (AAMA) wrote: This is part of the recall project  In [place] and the paper chart there are several office visit recalls that were due before the EGD recall that the patient never made. Do you wish to see the patient in the office in the office before his egd is scheduled or do you want to just schedule him for a direct egd? I have put the paper chart on your desk also.   Mearl Latin ------------------------------  Phone Note Outgoing Call Call back at [facility] Phone [phone]   Call placed by: Bernita Buffy CMA Deborra Medina),  November 27, 2009 9:34 AM Call placed to: Patient Summary of Call: rev scheduled for 01/27/2010 Initial call taken by: Bernita Buffy CMA ([place]),  November 27, 2009 9:35 AM

## 2010-06-16 NOTE — Assessment & Plan Note (Signed)
Review of gastrointestinal problems: 1. Reflux esophagitis, documented November, 2009. 2. Barrett's esophagus, documented November 2009 by EGD. Biopsies confirmed no dysplasia. Recall EGD November 2010.   History of Present Illness Visit Type: Follow-up Visit Primary GI MD: Owens Loffler MD Primary Provider: Stevie Kern, MD Chief Complaint: follow-up/consult EGD History of Present Illness:     very pleasant 75 year old man whom I last saw about 2 years ago. He was supposed to have a repeat EGD for Barrett's surveillance almost one year ago however that did not happen for unclear reasons.  who has had no dysphagia.  Stable weight.  No nausea, vomiting.  He takes zegerid only as needed,   This is about 2-3 times a month.           Current Medications (verified): 1)  Nitrostat 0.4 Mg  Subl (Nitroglycerin) .... One Sl Prn 2)  Micardis 20 Mg Tabs (Telmisartan) .[name].. 1 Tab By Mouth Once Daily 3)  Azathioprine 50 Mg Tabs (Azathioprine) .... 2 Morning 1 Afternoon 4)  Zegerid 20-1100 Mg Caps (Omeprazole-Sodium Bicarbonate) .[name].. 1 Tab By Mouth Once Daily As Needed 5)  Multivitamins   Tabs (Multiple Vitamin) .[name].. 1 Tab By Mouth Once Daily 6)  Levoxyl 100 Mcg Tabs (Levothyroxine Sodium) .[name].. 1 By Mouth Daily 7)  Aspirin 81 Mg Tbec (Aspirin) .... Take One Tablet By Mouth Daily 8)  Crestor 10 Mg Tabs (Rosuvastatin Calcium) .... Take One Tablet By Mouth Daily. 9)  Rapaflo 8 Mg Caps (Silodosin) .... Once Daily  Allergies (verified): No Known Drug Allergies  Past History:  Past Medical History: Current Problems:  ESSENTIAL HYPERTENSION, BENIGN (ICD-401.1) CAD (ICD-414.00) HYPERLIPIDEMIA (ICD-272.4) BRONCHIOLITIS (ICD-466.19) RENAL INSUFFICIENCY (ICD-588.9) IRITIS (ICD-364.3) HYPERCHOLESTEROLEMIA (ICD-272.0) GERD (ICD-530.81) BARRETT'S ESOPHAGUS (ICD-530.85) UNSPEC LOCAL INFECTION SKIN&SUBCUTANEOUS TISSUE (ICD-686.9) BOILS, RECURRENT (ICD-680.9) ABDOMINAL PAIN-EPIGASTRIC  (ICD-789.06) NAUSEA (ICD-787.02) EPIGASTRIC PAIN (ICD-789.06) NEUTROPENIA, DRUG-INDUCED (ICD-288.03) VASCULITIS (ICD-447.6) FAMILY HISTORY DIABETES 1ST DEGREE RELATIVE (ICD-V18.0) FAMILY HISTORY DEPRESSION (ICD-V17.0) FOREIGN BODY, SOFT TISSUE, RESIDUAL (ICD-729.6) RESTLESS LEG SYNDROME, SEVERE (ICD-333.94) NEPHRITIS, MEMBRANOPROLIFERATIVE (ICD-582.2) HYPOTHYROIDISM (ICD-244.9) DIABETES MELLITUS, TYPE II (ICD-250.00) ALLERGIC RHINITIS (ICD-477.9) ANEMIA (ICD-285.9) INTERSTITIAL LUNG DISEASE (ICD-515):         - vasculitis with pauci immune vasculits dx 2008 with renal bx., hx of BOOP on Olbx 2008,  now pulmonary          vasculits with pos p anca  Chronic left eye problems       Past Surgical History: Coronary artery bypass graft 2002 Cataract extraction   Vital Signs:  Patient profile:   75 year old male Height:      72 inches Weight:      242.38 pounds BMI:     32.99 Pulse rate:   80 / minute Pulse rhythm:   regular BP sitting:   126 / 84  (left arm)  Vitals Entered By: Randye Lobo NCMA (January 27, 2010 8:47 AM)  Physical Exam  Additional Exam:  Constitutional: generally well appearing Psychiatric: alert and oriented times 3 Abdomen: soft, non-tender, non-distended, normal bowel sounds    Impression & Recommendations:  Problem # 1:  Barrett's esophagus, history of esophagitis first I recommended that he change his proton pump inhibitor so that he takes on a daily basis rather than just as needed. I think he is having relatively asymptomatic GERD that is causing significant enough damage to his esophagus to create Barrett's changes, esophagitis. He is due for Barrett's surveillance and we will schedule an EGD for him at his soonest convenience. He has no alarm  symptoms and so I think it is unlikely that he has progressed to cancer.  Patient Instructions: 1)  You will be scheduled to have an upper endoscopy. 2)  You should change the way you are taking your  antiacid medicine (zegeride) so that you are taking it 20-30 minutes prior to a decent meal as that is the way the pill is designed to work most effectively. 3)  The medication list was reviewed and reconciled.  All changed / newly prescribed medications were explained.  A complete medication list was provided to the patient / caregiver.  Appended Document: Orders Update/EGD    Clinical Lists Changes  Orders: Added new Test order of EGD (EGD) - Signed

## 2010-06-16 NOTE — Letter (Signed)
Summary: [facility]   Imported By: Phillis Knack 07/16/2009 11:25:09  _____________________________________________________________________  External Attachment:    Type:   Image     Comment:   External Document

## 2010-06-16 NOTE — Letter (Signed)
Summary: EGD Instructions  [facility]  [place], [place] [address]   Phone: [phone]  Fax: [phone]       OMAR LICON    [birth year]    MRN: [id number]       Procedure Day /Date:02/23/10 MON     Arrival Time: 1 pm     Procedure Time:2 pm     Location of Procedure:                    [facility] (4th Floor)   PREPARATION FOR ENDOSCOPY   On10/10/11 THE DAY OF THE PROCEDURE:  1.   No solid foods, milk or milk products are allowed after midnight the night before your procedure.  2.   Do not drink anything colored red or purple.  Avoid juices with pulp.  No orange juice.  3.  You may drink clear liquids until 12 noon, which is 2 hours before your procedure.                                                                                                CLEAR LIQUIDS INCLUDE: Water Jello Ice Popsicles Tea (sugar ok, no milk/cream) Powdered fruit flavored drinks Coffee (sugar ok, no milk/cream) Gatorade Juice: apple, white grape, white cranberry  Lemonade Clear bullion, consomm, broth Carbonated beverages (any kind) Strained chicken noodle soup Hard Candy   MEDICATION INSTRUCTIONS  Unless otherwise instructed, you should take regular prescription medications with a small sip of water as early as possible the morning of your procedure.             OTHER INSTRUCTIONS  You will need a responsible adult at least 73 years of age to accompany you and drive you home.   This person must remain in the waiting room during your procedure.  Wear loose fitting clothing that is easily removed.  Leave jewelry and other valuables at home.  However, you may wish to bring a book to read or an iPod/MP3 player to listen to music as you wait for your procedure to start.  Remove all body piercing jewelry and leave at home.  Total time from sign-in until discharge is approximately 2-3 hours.  You should go home directly after  your procedure and rest.  You can resume normal activities the day after your procedure.  The day of your procedure you should not:   Drive   Make legal decisions   Operate machinery   Drink alcohol   Return to work  You will receive specific instructions about eating, activities and medications before you leave.    The above instructions have been reviewed and explained to me by   _______________________    I fully understand and can verbalize these instructions _____________________________ Date _________

## 2010-06-16 NOTE — Assessment & Plan Note (Signed)
Summary: f77m/dm   Visit Type:  Follow-up Referring Provider:  Lissa Hoard Renal Primary Provider:  Stevie Kern, MD  CC:  no changes.  History of Present Illness: Luke Mcbride is seen today in followup for his coronary artery disease, hypertension, and dyspnea.  He is S/P coronary bypass surgery in 2002.  His last Myoview study was done 3/24/ 2009.  He had a high lateral infarct but no ischemia and an EF of 47%.  He is back on baby aspirin.  He has had significant vasculitis involving his lugns and kidneys.    [name]  His dyspnea seems to be improving he is coughing less.  He had significant groundglass appearance to his lungs on CT scan.  Kidney function appears to have stabilized with a creatinine in the mid-2 range. He tells me it is down to 1.6 range now.    He was on Cytoxan for a while.  Feels much better on Imuran.  Not having any significant chest pain PND orthopnea he has trace lower extremity edema.  No diaphoresis or syncope.  Has had some visual problems with his eyes and is now off Diamox.  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  He sees Dr Edilia Bo at [place].  He has some right shoulder pain that is probably muscular.  He wanted to try being off Crestor for a while to see if it helps and I told him that was fine.    Current Problems (verified): 1)  Essential Hypertension, Benign  (ICD-401.1) 2)  Dyspnea  (ICD-786.05) 3)  Essential Hypertension, Benign  (ICD-401.1) 4)  Cad  (ICD-414.00) 5)  Hyperlipidemia  (ICD-272.4) 6)  Bronchiolitis  (ICD-466.19) 7)  Renal Insufficiency  (ICD-588.9) 8)  Iritis  (ICD-364.3) 9)  Hypercholesterolemia  (ICD-272.0) 10)  Gerd  (ICD-530.81) 11)  Barrett's Esophagus  (ICD-530.85) 12)  Unspec Local Infection Skin&subcutaneous Tissue  (ICD-686.9) 13)  Boils, Recurrent  (ICD-680.9) 14)  Abdominal Pain-epigastric  (ICD-789.06) 15)  Nausea  (ICD-787.02) 16)  Epigastric Pain  (ICD-789.06) 17)   Neutropenia, Drug-induced  (ICD-288.03) 18)  Vasculitis  (ICD-447.6) 19)  Family History Diabetes 1st Degree Relative  (ICD-V18.0) 20)  Family History Depression  (ICD-V17.0) 21)  Foreign Body, Soft Tissue, Residual  (ICD-729.6) 22)  Interstitial Lung Disease  (ICD-515) 23)  Restless Leg Syndrome, Severe  (ICD-333.94) 24)  Nephritis, Membranoproliferative  (ICD-582.2) 25)  Hypothyroidism  (ICD-244.9) 26)  Diabetes Mellitus, Type II  (ICD-250.00) 27)  Allergic Rhinitis  (ICD-477.9) 28)  Anemia  (ICD-285.9)  Current Medications (verified): 1)  Nitrostat 0.4 Mg  Subl (Nitroglycerin) .... One Sl Prn 2)  Micardis 20 Mg Tabs (Telmisartan) .[name].. 1 Tab By Mouth Once Daily 3)  Azathioprine 50 Mg Tabs (Azathioprine) .... 2 Morning 1 Afternoon 4)  Zegerid 20-1100 Mg Caps (Omeprazole-Sodium Bicarbonate) .[name].. 1 Tab By Mouth Once Daily As Needed 5)  Multivitamins   Tabs (Multiple Vitamin) .[name].. 1 Tab By Mouth Once Daily 6)  Levoxyl 100 Mcg Tabs (Levothyroxine Sodium) .[name].. 1 By Mouth Daily 7)  Aspirin 81 Mg Tbec (Aspirin) .... Take One Tablet By Mouth Daily 8)  Crestor 10 Mg Tabs (Rosuvastatin Calcium) .... Take One Tablet By Mouth Daily. 9)  Rapaflo 8 Mg Caps (Silodosin) .... Once Daily  Allergies (verified): No Known Drug Allergies  Past History:  Past Medical History: Last updated: 12/04/2008 Current Problems:  ESSENTIAL HYPERTENSION, BENIGN (ICD-401.1) CAD (ICD-414.00) HYPERLIPIDEMIA (ICD-272.4) BRONCHIOLITIS (ICD-466.19) RENAL INSUFFICIENCY (ICD-588.9) IRITIS (ICD-364.3)  HYPERCHOLESTEROLEMIA (ICD-272.0) GERD (ICD-530.81) BARRETT'S ESOPHAGUS (ICD-530.85) UNSPEC LOCAL INFECTION SKIN&SUBCUTANEOUS TISSUE (ICD-686.9) BOILS, RECURRENT (ICD-680.9) ABDOMINAL PAIN-EPIGASTRIC (ICD-789.06) NAUSEA (ICD-787.02) EPIGASTRIC PAIN (ICD-789.06) NEUTROPENIA, DRUG-INDUCED (ICD-288.03) VASCULITIS (ICD-447.6) FAMILY HISTORY DIABETES 1ST DEGREE RELATIVE (ICD-V18.0) FAMILY HISTORY DEPRESSION  (ICD-V17.0) FOREIGN BODY, SOFT TISSUE, RESIDUAL (ICD-729.6) RESTLESS LEG SYNDROME, SEVERE (ICD-333.94) NEPHRITIS, MEMBRANOPROLIFERATIVE (ICD-582.2) HYPOTHYROIDISM (ICD-244.9) DIABETES MELLITUS, TYPE II (ICD-250.00) ALLERGIC RHINITIS (ICD-477.9) ANEMIA (ICD-285.9) INTERSTITIAL LUNG DISEASE (ICD-515):         - 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 Married Never Smoked Alcohol use-yes Drug use-no Regular exercise-yes  Review of Systems       Denies fever, malais, weight loss, blurry vision, decreased visual acuity, cough, sputum, SOB, hemoptysis, pleuritic pain, palpitaitons, heartburn, abdominal pain, melena, lower extremity edema, claudication, or rash.   Vital Signs:  Patient profile:   75 year old male Height:      72 inches Weight:      243 pounds BMI:     33.08 Pulse rate:   70 / minute BP sitting:   128 / 86  (right arm) Cuff size:   regular  Vitals Entered By: Mignon Pine, RMA (November 21, 2009 9:16 AM)  Physical Exam  General:  Affect appropriate Healthy:  appears stated age 75: normal Neck supple with no adenopathy JVP normal no bruits no thyromegaly Lungs clear with no wheezing and good diaphragmatic motion Heart:  S1/S2 no murmur,rub, gallop or click PMI normal Abdomen: benighn, BS positve, no tenderness, no AAA no bruit.  No HSM or HJR Distal pulses intact with no bruits No edema Neuro non-focal Skin warm and dry    Impression & Recommendations:  Problem # 1:  ESSENTIAL  HYPERTENSION, BENIGN (ICD-401.1) Well controlled His updated medication list for this problem includes:    Micardis 20 Mg Tabs (Telmisartan) .[name]... 1 tab by mouth once daily    Aspirin 81 Mg Tbec (Aspirin) .[name]... Take one tablet by mouth daily  Problem # 2:  CAD (ICD-414.00) Stable no angina His updated medication list for this problem includes:    Nitrostat 0.4 Mg Subl (Nitroglycerin) ..... One sl prn    Aspirin 81 Mg Tbec (Aspirin) .[name]... Take one tablet by mouth daily  Problem # 3:  HYPERLIPIDEMIA (ICD-272.4) At goal.  Will stop for 4 weeks to see if right shoulder pain improves His updated medication list for this problem includes:    Crestor 10 Mg Tabs (Rosuvastatin calcium) .[name]... Take one tablet by mouth daily.  CHOL: 130 (07/20/2007)   LDL: 78 (07/20/2007)   HDL: 33.5 (07/20/2007)   TG: 91 (07/20/2007)  Problem # 4:  RENAL INSUFFICIENCY (ICD-588.9) Vasculitis:  F/U Dr Azzie Roup  Patient Instructions: 1)  Your physician wants you to follow-up in:  6 months.  You will receive a reminder letter in the mail two months in advance. If you don't receive a letter, please call our office to schedule the follow-up appointment.

## 2010-06-16 NOTE — Progress Notes (Signed)
Summary: rx refill  Phone Note Call from Patient Call back at Work Phone [phone]   Caller: pt Reason for Call: Refill Medication Summary of Call: pt has finished taking what was left of lipitor and needs a new rx called in for crestor. [facility] [place] [phone]. Initial call taken by: [place] Crane,  August 12, 2009 8:39 AM  Follow-up for Phone Call        pt aware script called in, repeat labs in 3 months  Fredia Beets, RN  August 12, 2009 8:50 AM     New/Updated Medications: CRESTOR 10 MG TABS (ROSUVASTATIN CALCIUM) Take one tablet by mouth daily. Prescriptions: CRESTOR 10 MG TABS (ROSUVASTATIN CALCIUM) Take one tablet by mouth daily.  #30 x 12   Entered by:   Fredia Beets, RN   Authorized by:   Bosie Clos, MD, [facility]   Signed by:   Fredia Beets, RN on 08/12/2009   Method used:   Electronically to        [facility] (retail)       [address]       [place],   [address]       Ph: [phone] or [phone]       Fax: [phone]   RxID:   [id number]

## 2010-06-17 ENCOUNTER — Encounter: Payer: Self-pay | Admitting: Critical Care Medicine

## 2010-06-18 NOTE — Assessment & Plan Note (Signed)
Summary: f72m   Referring Provider:  Lissa Hoard Renal Primary Provider:  Stevie Kern, MD  CC:  no complaints.  History of Present Illness: Luke Mcbride is seen today in followup for his coronary artery disease, hypertension, and dyspnea.  He is S/P coronary bypass surgery in 2002.  His last Myoview study was done 3/24/ 2009.  He had a high lateral infarct but no ischemia and an EF of 47%.  He is back on baby aspirin.  He has had significant vasculitis involving his lugns and kidneys.    [name]  His dyspnea seems to be improving he is coughing less.  He had significant groundglass appearance to his lungs on CT scan.  Kidney function appears to have stabilized with a creatinine in the mid-2 range. He tells me it is down to 1.6 range now.    He was on Cytoxan for a while and is now on Imuran  Feels much better on Imuran.  Not having any significant chest pain PND orthopnea he has trace lower extremity edema.  No diaphoresis or syncope.  Has had some visual problems with his eyes and is now off Diamox.  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  He sees Dr Edilia Bo at [facility].  He has some right shoulder pain that is probably muscular.  He wanted to try being off Crestor for a while to see if it helps and I told him that was fine.     Current Problems (verified): 1)  Essential Hypertension, Benign  (ICD-401.1) 2)  Dyspnea  (ICD-786.05) 3)  Essential Hypertension, Benign  (ICD-401.1) 4)  Cad  (ICD-414.00) 5)  Hyperlipidemia  (ICD-272.4) 6)  Bronchiolitis  (ICD-466.19) 7)  Renal Insufficiency  (ICD-588.9) 8)  Iritis  (ICD-364.3) 9)  Hypercholesterolemia  (ICD-272.0) 10)  Gerd  (ICD-530.81) 11)  Barrett's Esophagus  (ICD-530.85) 12)  Unspec Local Infection Skin&subcutaneous Tissue  (ICD-686.9) 13)  Boils, Recurrent  (ICD-680.9) 14)  Abdominal Pain-epigastric  (ICD-789.06) 15)  Nausea  (ICD-787.02) 16)  Epigastric Pain  (ICD-789.06) 17)   Neutropenia, Drug-induced  (ICD-288.03) 18)  Vasculitis  (ICD-447.6) 19)  Family History Diabetes 1st Degree Relative  (ICD-V18.0) 20)  Family History Depression  (ICD-V17.0) 21)  Foreign Body, Soft Tissue, Residual  (ICD-729.6) 22)  Interstitial Lung Disease  (ICD-515) 23)  Restless Leg Syndrome, Severe  (ICD-333.94) 24)  Nephritis, Membranoproliferative  (ICD-582.2) 25)  Hypothyroidism  (ICD-244.9) 26)  Diabetes Mellitus, Type II  (ICD-250.00) 27)  Allergic Rhinitis  (ICD-477.9) 28)  Anemia  (ICD-285.9)  Current Medications (verified): 1)  Nitrostat 0.4 Mg  Subl (Nitroglycerin) .... One Sl Prn 2)  Micardis 20 Mg Tabs (Telmisartan) .[name].. 1 Tab By Mouth Once Daily 3)  Azathioprine 50 Mg Tabs (Azathioprine) .... 2 Morning 1 Afternoon 4)  Zegerid 20-1100 Mg Caps (Omeprazole-Sodium Bicarbonate) .[name].. 1 Tab By Mouth Once Daily As Needed 5)  Multivitamins   Tabs (Multiple Vitamin) .[name].. 1 Tab By Mouth Once Daily 6)  Levoxyl 100 Mcg Tabs (Levothyroxine Sodium) .[name].. 1 By Mouth Daily 7)  Aspirin 81 Mg Tbec (Aspirin) .... Every Other Day 8)  Crestor 10 Mg Tabs (Rosuvastatin Calcium) .... Take One Tablet By Mouth Daily. 9)  Flomax 0.4 Mg Caps (Tamsulosin Hcl) .[name].. 1 Tab By Mouth Once Daily  Allergies (verified): No Known Drug Allergies  Past History:  Past Medical History: Last updated: 01/27/2010 Current Problems:  ESSENTIAL HYPERTENSION, BENIGN (ICD-401.1) CAD (ICD-414.00) HYPERLIPIDEMIA (ICD-272.4) BRONCHIOLITIS (ICD-466.19) RENAL  INSUFFICIENCY (ICD-588.9) IRITIS (ICD-364.3) HYPERCHOLESTEROLEMIA (ICD-272.0) GERD (ICD-530.81) BARRETT'S ESOPHAGUS (ICD-530.85) UNSPEC LOCAL INFECTION SKIN&SUBCUTANEOUS TISSUE (ICD-686.9) BOILS, RECURRENT (ICD-680.9) ABDOMINAL PAIN-EPIGASTRIC (ICD-789.06) NAUSEA (ICD-787.02) EPIGASTRIC PAIN (ICD-789.06) NEUTROPENIA, DRUG-INDUCED (ICD-288.03) VASCULITIS (ICD-447.6) FAMILY HISTORY DIABETES 1ST DEGREE RELATIVE (ICD-V18.0) FAMILY HISTORY DEPRESSION  (ICD-V17.0) FOREIGN BODY, SOFT TISSUE, RESIDUAL (ICD-729.6) RESTLESS LEG SYNDROME, SEVERE (ICD-333.94) NEPHRITIS, MEMBRANOPROLIFERATIVE (ICD-582.2) HYPOTHYROIDISM (ICD-244.9) DIABETES MELLITUS, TYPE II (ICD-250.00) ALLERGIC RHINITIS (ICD-477.9) ANEMIA (ICD-285.9) INTERSTITIAL LUNG DISEASE (ICD-515):         - 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 Married Never Smoked Alcohol use-yes Drug use-no Regular exercise-yes  Review of Systems       Denies fever, malais, weight loss, blurry vision, decreased visual acuity, cough, sputum, SOB, hemoptysis, pleuritic pain, palpitaitons, heartburn, abdominal pain, melena, lower extremity edema, claudication, or rash.   Vital Signs:  Patient profile:   75 year old male Height:      72 inches Weight:      252 pounds BMI:     34.30 Pulse rate:   72 / minute Resp:     14 per minute BP sitting:   120 / 80  (left arm)  Vitals Entered By: Burnett Kanaris (May 26, 2010 2:43 PM)  Physical Exam  General:  Affect appropriate Healthy:  appears stated age 31: normal Neck supple with no adenopathy JVP normal no bruits no thyromegaly Lungs clear with no wheezing and good diaphragmatic motion Heart:  S1/S2 no murmur,rub, gallop or click PMI normal Abdomen: benighn, BS positve, no tenderness, no AAA no bruit.  No HSM or HJR Distal pulses intact with no bruits No edema Neuro non-focal Skin warm and dry    Impression & Recommendations:  Problem # 1:  ESSENTIAL  HYPERTENSION, BENIGN (ICD-401.1) Well contorlled His updated medication list for this problem includes:    Micardis 20 Mg Tabs (Telmisartan) .[name]... 1 tab by mouth once daily    Aspirin 81 Mg Tbec (Aspirin) ..... Every other day  Problem # 2:  CAD (ICD-414.00)  Stable no angina His updated medication list for this problem includes:    Nitrostat 0.4 Mg Subl (Nitroglycerin) ..... One sl prn    Aspirin 81 Mg Tbec (Aspirin) ..... Every other day  His updated medication list for this problem includes:    Nitrostat 0.4 Mg Subl (Nitroglycerin) ..... One sl prn    Aspirin 81 Mg Tbec (Aspirin) ..... Every other day  Problem # 3:  HYPERLIPIDEMIA (ICD-272.4) At goal with no side effects His updated medication list for this problem includes:    Crestor 10 Mg Tabs (Rosuvastatin calcium) .[name]... Take one tablet by mouth daily.  CHOL: 130 (07/20/2007)   LDL: 78 (07/20/2007)   HDL: 33.5 (07/20/2007)   TG: 91 (07/20/2007)  Problem # 4:  RENAL INSUFFICIENCY (ICD-588.9) Stable Cr around 1.6  F/U [place] and continue Imuran Rx.  Recent Hb above 10  Patient Instructions: 1)  Your physician wants you to follow-up in: 1 year  You will receive a reminder letter in the mail two months  in advance. If you don't receive a letter, please call our office to schedule the follow-up appointment.

## 2010-06-29 ENCOUNTER — Ambulatory Visit
Admission: RE | Admit: 2010-06-29 | Discharge: 2010-06-29 | Disposition: A | Discharge status: Home / Self Care | Payer: Medicare Other | Source: Ambulatory Visit | Attending: Nephrology | Admitting: Nephrology

## 2010-06-29 ENCOUNTER — Other Ambulatory Visit: Payer: Self-pay | Admitting: Nephrology

## 2010-06-29 DIAGNOSIS — R05 Cough: Secondary | ICD-10-CM

## 2010-07-13 ENCOUNTER — Encounter: Payer: Self-pay | Admitting: Critical Care Medicine

## 2010-07-15 ENCOUNTER — Telehealth (INDEPENDENT_AMBULATORY_CARE_PROVIDER_SITE_OTHER): Payer: Self-pay | Admitting: *Deleted

## 2010-07-16 ENCOUNTER — Ambulatory Visit (INDEPENDENT_AMBULATORY_CARE_PROVIDER_SITE_OTHER): Payer: Medicare Other | Admitting: Critical Care Medicine

## 2010-07-16 ENCOUNTER — Encounter: Payer: Self-pay | Admitting: Critical Care Medicine

## 2010-07-16 DIAGNOSIS — J309 Allergic rhinitis, unspecified: Secondary | ICD-10-CM

## 2010-07-23 NOTE — Progress Notes (Signed)
Summary: appt  Phone Note From Other Clinic Call back at [phone]   Caller: Mary//[facility] Call For: [place] Summary of Call: States that Dr. Marval Regal wants pt to see PW asap re sob, weakness hx of vasculitis, hx of pulmonary hemorrhaging pls advise. Initial call taken by: Netta Neat,  July 15, 2010 12:15 PM  Follow-up for Phone Call        Pt last seen by PW 12/2008.  ? if pt can come in to [facility] office tomorrow.  [facility] Crystal Jones RN  July 15, 2010 2:10 PM   Appt already scheduled with PW for today in [place].  Pt arrived.   Follow-up by: Raymondo Band RN,  July 16, 2010 2:24 PM

## 2010-07-23 NOTE — Assessment & Plan Note (Addendum)
Summary: Pulmonary OV   Copy to:  Lissa Hoard Renal Primary Provider/Referring Provider:  Stevie Kern, MD  CC:  Follow up per Dr. Marval Regal request.  Last seen 12/2008.  c/o dry cough, fatigue, and increased SOB when going up stairs x  3 wks.  Denies wheezing and chest tightness.[name]  History of Present Illness: Pulmonary F/U:     74 wm with h/o pauciimmune vasculitis with renal involvement and hx of transient hemoptysis in the past with associated BOOP on OLBx 5/08.   Renal Bx in 2008 showed GLN with vasculitis.  Rx intitially with cytoxan/pred.  then switched to   imuran off prednisone.   last bad pulmonary flare 1 year ago  heralded by hemoptysis.   originally positive for BOOP then assumed pulmonary capallaritis but never proven on bx.  Only renal Bx pos for vasculitis and pANCA positive  11/13/07: CXR showed on 2022-11-07 more pronounced changes c/w acute pulmonary hemorrhage. Pt started on Cytoxan 150mg /d and pred 80mg /d.   Since that visit no further hemoptysis.  Labs have shown:  Cr 2.4 then 2.2 6/29.  Hgb: 8.2 then 7.7 6/29.   WBC 6/29 15.1   AntiGBM <3.0Serum Fe is normal PANCA pos 1:160,  LFTs normal,  BS have been  high  since pred/cytoxan added:  no energy and severe dyspnea  and not able to make it to the car.   10/19: The patient notes no further blood from nares.  There is no cough.  There is   no hemoptysis. The patient notes  no chest pain.  The serum Cr went from  1.6  down to 1.32 10/09.  The pt is off prednisone now altogether.  On Cytoxan 150mg  per day.   Pt denies any significant sore throat, nasal congestion or excess secretions, fever, chills, sweats, unintended weight loss, pleurtic or exertional chest pain, orthopnea PND, or leg swelling Pt denies any increase in rescue therapy over baseline, denies waking up needing it or having any early am or nocturnal exacerbations of coughing/wheezing/or dyspnea.   December 31, 2008 2:23 PM Not seen since last Fall2009.  Pt noting  more dyspnea but not any cough.  No skin rash or hemoptysis.  Pt notes minimal heartburn.  Cr 1.9 and Hgb 11.7 on aranesp. Hx of BOOP and vasculitis.  Now off cytoxan but is on imuran.  Dyspnea worse over 6 months. No recent CXR  No alleviating or ppt factors.   July 16, 2010 2:35 PM saw renal 2/27: anca and complement levels sent. Still with a cough but not as bad, started three weeks ago.  Cough was dry ,  would not lessen. Would cough so hard would go into spasms and felt weak.  Rx abx for 10days. ? name.  Was dyspneic if goes up hill or stairs.  No real chest pain.  No real wheeze.  No f/c/s. No real edema.  No hemoptysis at all.  Notes pn drip at time of the flare.  Was Rx clarinex but could not get due to PA. The benedryl helps the drainage.  Quit micardis on 2/27 d/t ? cause of the cough  Current Medications (verified): 1)  Nitrostat 0.4 Mg  Subl (Nitroglycerin) .... One Sl Prn 2)  Micardis 20 Mg Tabs (Telmisartan) .[name].. 1 Tab By Mouth Once Daily 3)  Azathioprine 50 Mg Tabs (Azathioprine) .... 2 Morning 1 Afternoon 4)  Zegerid 20-1100 Mg Caps (Omeprazole-Sodium Bicarbonate) .[name].. 1 Tab By Mouth Once Daily As Needed 5)  Multivitamins  Tabs (Multiple Vitamin) .[name].. 1 Tab By Mouth Once Daily 6)  Levoxyl 100 Mcg Tabs (Levothyroxine Sodium) .[name].. 1 By Mouth Daily 7)  Aspirin 81 Mg Tbec (Aspirin) .... As Needed 8)  Crestor 10 Mg Tabs (Rosuvastatin Calcium) .... Take One Tablet By Mouth Daily. 9)  Flomax 0.4 Mg Caps (Tamsulosin Hcl) .[name].. 1 Tab By Mouth Once Daily  Allergies (verified): No Known Drug Allergies  Past History:  Past medical, surgical, family and social histories (including risk factors) reviewed, and no changes noted (except as noted below).  Past Medical History: Reviewed history from 01/27/2010 and no changes required. Current Problems:  ESSENTIAL HYPERTENSION, BENIGN (ICD-401.1) CAD (ICD-414.00) HYPERLIPIDEMIA (ICD-272.4) BRONCHIOLITIS (ICD-466.19) RENAL  INSUFFICIENCY (ICD-588.9) IRITIS (ICD-364.3) HYPERCHOLESTEROLEMIA (ICD-272.0) GERD (ICD-530.81) BARRETT'S ESOPHAGUS (ICD-530.85) UNSPEC LOCAL INFECTION SKIN&SUBCUTANEOUS TISSUE (ICD-686.9) BOILS, RECURRENT (ICD-680.9) ABDOMINAL PAIN-EPIGASTRIC (ICD-789.06) NAUSEA (ICD-787.02) EPIGASTRIC PAIN (ICD-789.06) NEUTROPENIA, DRUG-INDUCED (ICD-288.03) VASCULITIS (ICD-447.6) FAMILY HISTORY DIABETES 1ST DEGREE RELATIVE (ICD-V18.0) FAMILY HISTORY DEPRESSION (ICD-V17.0) FOREIGN BODY, SOFT TISSUE, RESIDUAL (ICD-729.6) RESTLESS LEG SYNDROME, SEVERE (ICD-333.94) NEPHRITIS, MEMBRANOPROLIFERATIVE (ICD-582.2) HYPOTHYROIDISM (ICD-244.9) DIABETES MELLITUS, TYPE II (ICD-250.00) ALLERGIC RHINITIS (ICD-477.9) ANEMIA (ICD-285.9) INTERSTITIAL LUNG DISEASE (ICD-515):         - vasculitis with pauci immune vasculits dx 2008 with renal bx., hx of BOOP on Olbx 2008,  now pulmonary          vasculits with pos p anca  Chronic left eye problems       Past Surgical History: Reviewed history from 01/27/2010 and no changes required. Coronary artery bypass graft 2002 Cataract extraction   Family History: Reviewed history from 05/23/2007 and no changes required. Family History Depression Family History Diabetes 1st degree relative Family History of Prostate CA 1st degree relative <50  Social History: Reviewed history from 03/27/2008 and no changes required. [place] Married Never Smoked Alcohol use-yes Drug use-no Regular exercise-yes  Review of Systems       The patient complains of non-productive cough and nasal congestion/difficulty breathing through nose.  The patient denies shortness of breath with activity, shortness of breath at rest, productive cough, coughing up blood, chest pain, irregular heartbeats, acid heartburn, indigestion, loss of appetite, weight change, abdominal pain, difficulty swallowing, sore throat, tooth/dental problems, headaches, sneezing, itching, ear ache, anxiety,  depression, hand/feet swelling, joint stiffness or pain, rash, change in color of mucus, and fever.    Vital Signs:  Patient profile:   75 year old male Height:      72 inches Weight:      245 pounds BMI:     33.35 O2 Sat:      96 % on Room air Temp:     98.2 degrees F oral Pulse rate:   80 / minute BP sitting:   140 / 70  (left arm) Cuff size:   large  Vitals Entered By: Raymondo Band RN (July 16, 2010 2:26 PM)  O2 Flow:  Room air CC: Follow up per Dr. Marval Regal request.  Last seen 12/2008.  c/o dry cough, fatigue, increased SOB when going up stairs x  3 wks.  Denies wheezing and chest tightness. Comments Medications reviewed with patient Daytime contact number verified with patient. Raymondo Band RN  July 16, 2010 2:29 PM    Physical Exam  Additional Exam:  Gen: Pleasant, well nourished, in no distress ENT: mild rhinitis with postnasal drip Neck: No JVD, no TMG, no carotid bruits Lungs: no use of accessory muscles, no dullness to percussion, clear without rales or rhonchi Cardiovascular: RRR, heart sounds  normal, no murmurs or gallops, no peripheral edema Musculoskeletal: No deformities, no cyanosis or clubbing     CXR  Procedure date:  06/29/2010  Findings:      No active disease   Impression & Recommendations:  Problem # 1:  ALLERGIC RHINITIS (ICD-477.9) Assessment Deteriorated allergic rhinitis but no evident vasculitis flare plan trial flonase/chlorpheniramine His updated medication list for this problem includes:    Flonase 50 Mcg/act Susp (Fluticasone propionate) .[name]..[name] Two sprays each nostril daily generic please    Chlorpheniramine Maleate 12 Mg Cr-tabs (Chlorpheniramine maleate) .[name]... Take one by mouth at bedtime  Orders: Est. Patient Level IV [id number])  Problem # 2:  VASCULITIS (ICD-447.6) Assessment: Improved  Significant systemic problem invollving lungs with BOOP and pulmonary hemorrage, CRF with glomerulonephritis.  All resolved  No evident  flare of either BOOP or vasculitis /hemorrhage in lungs on exam or CXR today  plan  no med changes   Medications Added to Medication List This Visit: 1)  Micardis 20 Mg Tabs (Telmisartan) .... Hold 2)  Aspirin 81 Mg Tbec (Aspirin) .... As needed 3)  Flonase 50 Mcg/act Susp (Fluticasone propionate) .... Two sprays each nostril daily generic please 4)  Chlorpheniramine Maleate 12 Mg Cr-tabs (Chlorpheniramine maleate) .... Take one by mouth at bedtime  Complete Medication List: 1)  Nitrostat 0.4 Mg Subl (Nitroglycerin) .... One sl prn 2)  Micardis 20 Mg Tabs (Telmisartan) .... Hold 3)  Azathioprine 50 Mg Tabs (Azathioprine) .... 2 morning 1 afternoon 4)  Zegerid 20-1100 Mg Caps (Omeprazole-sodium bicarbonate) .[name].. 1 tab by mouth once daily as needed 5)  Multivitamins Tabs (Multiple vitamin) .[name].. 1 tab by mouth once daily 6)  Levoxyl 100 Mcg Tabs (Levothyroxine sodium) .[name].. 1 by mouth daily 7)  Aspirin 81 Mg Tbec (Aspirin) .... As needed 8)  Crestor 10 Mg Tabs (Rosuvastatin calcium) .... Take one tablet by mouth daily. 9)  Flomax 0.4 Mg Caps (Tamsulosin hcl) .[name].. 1 tab by mouth once daily 10)  Flonase 50 Mcg/act Susp (Fluticasone propionate) .... Two sprays each nostril daily generic please 11)  Chlorpheniramine Maleate 12 Mg Cr-tabs (Chlorpheniramine maleate) .... Take one by mouth at bedtime  Patient Instructions: 1)  Trial generic Flonase (fluticasone) two sprays each nostril daily 2)  Chlorpheniramine 12mg  one at bedtime 3)  Continue to hold micardis for now 4)  Return 6 weeks [place], or [place]  Prescriptions: CHLORPHENIRAMINE MALEATE 12 MG CR-TABS (CHLORPHENIRAMINE MALEATE) Take one by mouth at bedtime  #30 x 6   Entered and Authorized by:   Elsie Stain MD   Signed by:   Elsie Stain MD on 07/16/2010   Method used:   Electronically to        [facility]  [address] (retail)       [place], [place]  [address]       Ph: [phone] or  [phone]       Fax: [phone]   RxID:   [id number] FLONASE 50 MCG/ACT SUSP (FLUTICASONE PROPIONATE) two sprays each nostril daily Generic Please  #1 x 6   Entered and Authorized by:   Elsie Stain MD   Signed by:   Elsie Stain MD on 07/16/2010   Method used:   [facility] to        [facility]  [address] [place] (retail[facility]       [address]       [place], [place]  [address]  Ph: [phone] or [phone]       Fax: [phone]   RxID:   [id number]    Immunization History:  Influenza Immunization History:    Influenza:  historical (03/17/2010)   Appended Document: Pulmonary OV fax joe [place]

## 2010-07-27 ENCOUNTER — Telehealth: Payer: Self-pay | Admitting: Critical Care Medicine

## 2010-08-04 NOTE — Progress Notes (Signed)
Summary: Antihistamine directions  Phone Note Call from Patient Call back at Home Phone [phone] Call back at [phone]   Caller: [place] Reason for Call: Refill Medication Summary of Call: Dr. Joya Gaskins told patient to take over counter chlortheniramine maleate (antihistamine), 12 mg tablet at bedtime.  Pharmacist did not have dose and told patient's wife that he never heard of anyone taking such large dose.  Plus patient has kidney problems.  Patient's wife says patient is coughing and his eyes are watering.  Please call to advise. Initial call taken by: Jacqualine Code,  July 27, 2010 9:58 AM  Follow-up for Phone Call        Spoke with pt wife and she states that after they saw Dr. Joya Gaskins they received a call from Dr. Meredeth Ide and was advsied  the pt renal levels were high and he was prescribed a new medication for this. Pt wife could not remember the name of the new medication. She wanted to know if it was still ok for the pt to take Chlortrimeton 12mg  tablets with his levels being elevated. She states the pharmacists said this was a high dose and she should check with Dr. Joya Gaskins since there has been some changes in pt condition.  Please advsie. Tabor Bing CMA  July 27, 2010 10:19 AM   Additional Follow-up for Phone Call Additional follow up Details #1::        ok to change to chlorpheniramine 4mg  by mouth q8h as needed  Additional Follow-up by: Elsie Stain MD,  July 27, 2010 2:34 PM    Additional Follow-up for Phone Call Additional follow up Details #2::    pt advised. med list changed. [place] Bing CMA  July 27, 2010 2:38 PM   New/Updated Medications: CHLORPHENIRAMINE MALEATE 4 MG TABS (CHLORPHENIRAMINE MALEATE) One tablet every 8 hours as needed

## 2010-08-26 ENCOUNTER — Other Ambulatory Visit: Payer: Self-pay | Admitting: Cardiovascular Disease

## 2010-08-26 ENCOUNTER — Encounter: Payer: Self-pay | Admitting: Critical Care Medicine

## 2010-08-27 ENCOUNTER — Encounter: Payer: Self-pay | Admitting: Critical Care Medicine

## 2010-08-27 ENCOUNTER — Ambulatory Visit (INDEPENDENT_AMBULATORY_CARE_PROVIDER_SITE_OTHER): Payer: Medicare Other | Admitting: Critical Care Medicine

## 2010-08-27 DIAGNOSIS — J309 Allergic rhinitis, unspecified: Secondary | ICD-10-CM

## 2010-08-27 DIAGNOSIS — J8410 Pulmonary fibrosis, unspecified: Secondary | ICD-10-CM

## 2010-08-27 NOTE — Patient Instructions (Signed)
No change in medications A pulmonary function study will be obtained at the main office : Full, I will call with results Return 4 months [place]

## 2010-08-27 NOTE — Assessment & Plan Note (Signed)
Rhinitis improved Plan Cont flonase

## 2010-08-27 NOTE — Assessment & Plan Note (Signed)
Hx boop,  No current evidence on CXR and exam for pulmonary vasculitis or boop flare Chronic DOE likely from deconditioning, recent cough from postnasal drip syndrome d/t allergic rhinitis Plan Full pfts  No change in meds

## 2010-08-27 NOTE — Progress Notes (Signed)
Subjective:    Patient ID: Luke Mcbride, male    DOB: Sep 23, 1935, 75 y.o.   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 Renal Bx in 2008 showed GLN with vasculitis. Rx intitially with cytoxan/pred. then switched to imuran off prednisone. last bad pulmonary flare 1 year ago heralded by hemoptysis. originally positive for BOOP then assumed pulmonary capallaritis but never proven on bx. Only renal Bx pos for vasculitis and pANCA positive   11/13/07: CXR showed on Nov 08, 2022 more pronounced changes c/w acute pulmonary hemorrhage.  Pt started on Cytoxan 150mg /d and pred 80mg /d. Since that visit no further hemoptysis. Labs have shown: Cr 2.4 then 2.2 6/29. Hgb: 8.2 then 7.7 6/29. WBC 6/29 15.1 AntiGBM <3.0Serum Fe is normal  PANCA pos 1:160, LFTs normal,   03/04/08:  The patient notes no further blood from nares. There is no cough. There is no hemoptysis. The patient notes no chest pain. The serum Cr went from 1.6 down to 1.32 10/09. The pt is off prednisone now altogether. On Cytoxan 150mg  per day.  Pt denies any significant sore throat, nasal congestion or excess secretions, fever, chills, sweats, unintended weight loss, pleurtic or exertional chest pain, orthopnea PND, or leg swelling  Pt denies any increase in rescue therapy over baseline, denies waking up needing it or having any early am or nocturnal exacerbations of coughing/wheezing/or dyspnea.   July 16, 2010 2:35 PM  saw renal 2/27: anca and complement levels sent.  Still with a cough but not as bad, started three weeks ago. Cough was dry , would not lessen. Would cough so hard would go into spasms and felt weak. Rx abx for 10days. ? name. Was dyspneic if goes up hill or stairs. No real chest pain. No real wheeze. No f/c/s.  No real edema. No hemoptysis at all. Notes pn drip at time of the flare. Was Rx clarinex but could not get  due to PA.  The benedryl helps the drainage. Quit micardis on 2/27 d/t ? cause of the cough   08/27/2010 1) Trial generic Flonase (fluticasone) two sprays each nostril daily  2) Chlorpheniramine 12mg  one at bedtime  3) Continue to hold micardis for now Cough finally went away,  Another antiimmune drug not tolerated due to side effects.  Took for one week and gave up. Still on imuran. Cr has been stable.  Went in last week and blood work done ?results Hgb down  Gets aranesp for this   Past Medical History  Diagnosis Date  . Hypertension   . Hypothyroidism   . ALLERGIC RHINITIS   . CAD (coronary artery disease)   . Hyperlipemia   . Bronchiolitis   . Renal insufficiency   . Iritis   . Hypercholesterolemia   . GERD (gastroesophageal reflux disease)   . Barrett's esophagus   . Local infection of skin and subcutaneous tissue   . Recurrent boils   . Abdominal pain, epigastric   . Nausea   . Epigastric pain   . Neutropenia, drug-induced   . Vasculitis   . Family history of diabetes mellitus   . Family history of depression   . Residual foreign body in soft tissue   . RLS (restless legs syndrome)   . Membranoproliferative nephritis   . DM II (diabetes mellitus, type II), controlled   . Anemia   . ILD (interstitial lung disease)  Family History  Problem Relation Age of Onset  . Depression Other   . Diabetes Other   . Prostate cancer Other      History   Social History  . Marital Status: Married    Spouse Name: N/A    Number of Children: N/A  . Years of Education: N/A   Occupational History  . [employer] History Main Topics  . Smoking status: Never Smoker   . Smokeless tobacco: Never Used  . Alcohol Use: No  . Drug Use: No  . Sexually Active: Not on file   Other Topics Concern  . Not on file   Social History Narrative   Regular exercise - yes     No Known Allergies   Outpatient Prescriptions Prior to Visit  Medication Sig Dispense Refill    . aspirin 81 MG tablet Take 81 mg by mouth daily as needed.       [name] azaTHIOprine (IMURAN) 50 MG tablet 2 in the morning, 1 in the afternoon       . CRESTOR 10 MG tablet TAKE ONE TABLET BY MOUTH DAILY.  30 tablet  7  . fluticasone (FLONASE) 50 MCG/ACT nasal spray 2 sprays by Nasal route daily.        [name] levothyroxine (SYNTHROID, LEVOTHROID) 100 MCG tablet Take 100 mcg by mouth daily.        . Multiple Vitamin (MULTIVITAMIN) tablet Take 1 tablet by mouth daily.        . nitroGLYCERIN (NITROSTAT) 0.4 MG SL tablet Place 0.4 mg under the tongue every 5 (five) minutes as needed. May repeat x3       . omeprazole-sodium bicarbonate (ZEGERID) 40-1100 MG per capsule Take 1 capsule by mouth daily as needed.        . Tamsulosin HCl (FLOMAX) 0.4 MG CAPS Take 0.4 mg by mouth daily.        [name] telmisartan (MICARDIS) 20 MG tablet Take 20 mg by mouth daily.       . chlorpheniramine (CHLOR-TRIMETON) 4 MG tablet Take 4 mg by mouth every 8 (eight) hours as needed.           Review of Systems Constitutional:   No  weight loss, night sweats,  Fevers, chills, fatigue, lassitude. HEENT:   No headaches,  Difficulty swallowing,  Tooth/dental problems,  Sore throat,                No sneezing, itching, ear ache, nasal congestion, post nasal drip,   CV:  No chest pain,  Orthopnea, PND, swelling in lower extremities, anasarca, dizziness, palpitations  GI  No heartburn, indigestion, abdominal pain, nausea, vomiting, diarrhea, change in bowel habits, loss of appetite  Resp: Notes  shortness of breath with exertion not at rest.  No excess mucus, no productive cough,  No non-productive cough,  No coughing up of blood.  No change in color of mucus.  No wheezing.  No chest wall deformity  Skin: no rash or lesions.  GU: no dysuria, change in color of urine, no urgency or frequency.  No flank pain.  MS:  No joint pain or swelling.  No decreased range of motion.  No back pain.  Psych:  No change in mood or affect. No  depression or anxiety.  No memory loss.     Objective:   Physical Exam Gen: Pleasant, well-nourished, in no distress,  normal affect  ENT: No lesions,  mouth clear,  oropharynx clear, mild postnasal drip,  mild erythema, no evidence for vasculitis  Neck: No JVD, no TMG, no carotid bruits  Lungs: No use of accessory muscles, no dullness to percussion, clear without rales or rhonchi  Cardiovascular: RRR, heart sounds normal, no murmur or gallops, no peripheral edema  Abdomen: soft and NT, no HSM,  BS normal  Musculoskeletal: No deformities, no cyanosis or clubbing  Neuro: alert, non focal  Skin: Warm, no lesions or rashes        Assessment & Plan:   INTERSTITIAL LUNG DISEASE Hx boop,  No current evidence on CXR and exam for pulmonary vasculitis or boop flare Chronic DOE likely from deconditioning, recent cough from postnasal drip syndrome d/t allergic rhinitis Plan Full pfts  No change in meds   ALLERGIC RHINITIS Rhinitis improved Plan Cont flonase    Updated Medication List Outpatient Encounter Prescriptions as of 08/27/2010  Medication Sig Dispense Refill  . aspirin 81 MG tablet Take 81 mg by mouth daily as needed.       [name] azaTHIOprine (IMURAN) 50 MG tablet 2 in the morning, 1 in the afternoon       . CRESTOR 10 MG tablet TAKE ONE TABLET BY MOUTH DAILY.  30 tablet  7  . fluticasone (FLONASE) 50 MCG/ACT nasal spray 2 sprays by Nasal route daily.        [name] levothyroxine (SYNTHROID, LEVOTHROID) 100 MCG tablet Take 100 mcg by mouth daily.        . Multiple Vitamin (MULTIVITAMIN) tablet Take 1 tablet by mouth daily.        . nitroGLYCERIN (NITROSTAT) 0.4 MG SL tablet Place 0.4 mg under the tongue every 5 (five) minutes as needed. May repeat x3       . omeprazole-sodium bicarbonate (ZEGERID) 40-1100 MG per capsule Take 1 capsule by mouth daily as needed.        . Tamsulosin HCl (FLOMAX) 0.4 MG CAPS Take 0.4 mg by mouth daily.        [name] telmisartan (MICARDIS) 20 MG tablet  Take 20 mg by mouth daily.       [name] DISCONTD: chlorpheniramine (CHLOR-TRIMETON) 4 MG tablet Take 4 mg by mouth every 8 (eight) hours as needed.

## 2010-09-02 ENCOUNTER — Ambulatory Visit (INDEPENDENT_AMBULATORY_CARE_PROVIDER_SITE_OTHER): Payer: Medicare Other | Admitting: Critical Care Medicine

## 2010-09-02 DIAGNOSIS — J8410 Pulmonary fibrosis, unspecified: Secondary | ICD-10-CM

## 2010-09-02 NOTE — Progress Notes (Signed)
PFT done today. 

## 2010-09-04 ENCOUNTER — Encounter: Payer: Self-pay | Admitting: Critical Care Medicine

## 2010-09-29 NOTE — Assessment & Plan Note (Signed)
[place]                             PULMONARY OFFICE NOTE   NAME:[name]                     MRN:          [id number]  DATE:09/27/2006                            DOB:          1935/05/25    CHIEF COMPLAINT:  Evaluate hemoptysis.   HISTORY OF PRESENT ILLNESS:  This is a 75 year old male who has had the  onset now for 2 weeks of blood-streaked mucus and discomfort in the left  upper lobe area with increased dyspnea with exertion.  The patient tires  very easily.  There has been no skin change, no arthritis, no fevers,  chills, or sweats.  He has chronic left iritis that has flared back  again, he is now on steroid eye drops.  He is a lifelong never smoker,  he has had no joint complaints.  Noted chronic sinus complaints for 1  year.  There has been no blood in the urine.  No flank pain noted.  The  patient notes shortness of breath with exertion and a productive cough  of bloody mucous.  The patient is referred for further evaluation with  abnormalities on the chest x-ray and CT scan noted.   PAST MEDICAL HISTORY:  Medical history of heart attack with coronary  artery disease and bypass surgery in 2002, does have a history of high  cholesterol.  No history of stroke, blood clots or cancer.  No history  of kidney disease, sleep disorders or disorders of the central nervous  system.  Operative history includes that of bypass surgery in 2002.   MEDICATION ALLERGIES:  None.   SOCIAL HISTORY:  Lifelong never smoker.  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  He is married, lives at home with  his wife.   FAMILY HISTORY:  Father had prostate cancer, mother had heart disease.   REVIEW OF SYSTEMS:  There is some earache and headaches.  No joint pain  or rash.  No acid reflux symptoms, no weight change, and no abdominal  pain, difficulty swallowing, or sore throat.   CURRENT MEDICATIONS:  1. Aspirin 81 mg daily.  2. Lipitor 80 mg daily.  3. Niaspan 1000 mg b.i.d.  4. Synthroid 100 mcg daily.  5. Potassium daily.  6. Multivitamin daily.  7. Lisinopril 2.5 mg daily.  8. Hydrochlorothiazide 25 mg daily.  9. Steroid eye drops left eye daily.   PHYSICAL EXAMINATION:  This is a middle aged male in no distress.  Temp 99.1, blood pressure 134/76, pulse 72, saturation was 95% on room  air.  CHEST:  Rales at both bases, no wheeze or rhonchi noted.  CARDIAC EXAM:  Showed a regular rate and rhythm without S3, normal S1,  S2.  ABDOMEN:  Soft, nontender.  EXTREMITIES:  Showed no edema or clubbing or venous disease.  SKIN:  Clear.  JOINT EXAM:  Intact.  HEENT EXAM:  Left iris is dilated.  There is no scleral icterus.  There  is no conjunctival hemorrhage noted.  There are no splinter hemorrhages.   LABORATORY DATA:  CT scan of  the chest and chest x-ray show bilateral  pulmonary infiltrates with bilateral ground glass appearance.   Labs are reviewed from Dr. Honor Junes office.  January, white count is  10,000, hemoglobin of 15.2, creatinine 0.9, hemoglobin A1c of 6.5, sed  rate on Sep 27, 2006 is 51, rheumatoid factor is less than 20.  There  are no other labs that have been available or for review.   IMPRESSION:  Bilateral ground glass inflammation, rule out vasculitis in  a patient with hemoptysis.  Evaluation for alveolar hemorrhage and  vasculitis.   PLAN:  Obtain an ANCA level, obtain ANA level, hypersensitivity panel.  Will pursue full pulmonary function studies and refer to Dr. Modesto Charon for open lung biopsy.  Once the results of this are  available, further recommendations would follow.     Burnett Harry Joya Gaskins, MD, [facility]  Electronically Signed    PEW/MedQ  DD: 09/28/2006  DT: 09/28/2006  Job #: [id number]   cc:   Dellis Filbert A. Sherren Mocha, MD  Revonda Standard Roxan Hockey, M.D.

## 2010-09-29 NOTE — Op Note (Signed)
NAME:  DAWAYNE, HIGBY              ACCOUNT NO.:  [id number]   MEDICAL RECORD NO.:  [id number]          PATIENT TYPE:  INP   LOCATION:  2550                         FACILITY:  [facility]   PHYSICIAN:  Revonda Standard. Roxan Hockey, M.D.DATE OF BIRTH:  09/01/1935   DATE OF PROCEDURE:  10/13/2006  DATE OF DISCHARGE:                               OPERATIVE REPORT   PREOPERATIVE DIAGNOSIS:  Hemoptysis with pulmonary opacities.   POSTOPERATIVE DIAGNOSIS:  Likely interstitial pneumonitis.   PROCEDURE:  Right video assisted thoracoscopic surgery lung biopsy.   SURGEON:  Revonda Standard. Roxan Hockey, M.D.   ASSISTANT:  Doroteo Bradford, P.A.-C.   ANESTHESIA:  General.   FINDINGS:  Purplish discolored splotchy areas in the lung. Biopsies  taken from three sites. Frozen section revealed likely interstitial  pneumonitis.   CLINICAL NOTE:  Mr. Kearney is a 75 year old gentleman who has had about  a two month history of hemoptysis and this has been persistent. He was  seen in consultation by Dr. Asencion Noble.  A CT scan showed bilateral  pulmonary opacities. These were patchy ground glass type opacities, more  prominent on the left than the right. The patient was seen in  consultation and advised to undergo right VATS given his previous bypass  surgery with likely adhesions in the left chest, for biopsy for  diagnostic purposes.  He understood this was a diagnostic and not  therapeutic procedure.  He understood the risks and agreed to proceed.   OPERATIVE NOTE:  Mr. Eastridge was brought to the preop holding area on  Oct 13, 2006.  There, the anesthesia service attained IV access and  arterial and blood pressure monitoring catheter was placed.  The patient  was given intravenous antibiotics. PAS hose were placed for DVT  prophylaxis.  He was taken to the operating room, anesthetized, and  intubated with a double lumen endotracheal tube. A Foley catheter was  placed.  He was placed in a left lateral  decubitus position and the  right chest was prepped and draped in the usual fashion. Single lung  ventilation of the left lung was carried out.  The patient tolerated  this well throughout the procedure.   An incision was made in the midaxillary line approximately the seventh  intercostal space.  This was carried through the skin and subcutaneous  tissue.  The chest was entered bluntly using a hemostat.  A port was  inserted and the thoracoscope was placed in the port. Initial inspection  revealed no pleural effusion.  There was no significant abnormality of  the parietal pleura.  The majority the lung appeared normal, however, on  the right middle lobe, there were two areas of purplish discoloration  which were sharply demarcated from the surrounding normal lung. There  also was an easily visible area in the lateral aspect of the right lower  lobe. A biopsy was performed on the right middle lobe removing the  patchy area that was adjacent to the minor fissure. Three firings of a  ATB-45 stapler were performed.  There was good hemostasis at the staple  lines.  The specimen was divided, a  portion was sent for frozen and  permanent sections, the remainder was sent for bacterial, fungal, viral,  AFB, and Mycoplasma cultures.  Two additional areas, one in the right  lower lobe and one in the right middle lobe, were then biopsied, again  with sequential firings of the ATB-45 stapler. The specimens were sent  for pathology for additional studies in case additional slides needed to  be sent out for outside review. There was good hemostasis at all staple  lines.  A 28 French chest tube was placed through the anterior most  incision and was secured with a #1 silk suture.  The remaining incisions  were closed with a #1 Vicryl fascial suture and a 3-0 Vicryl  subcuticular suture.  All sponge, needle and instrument counts were  correct at the end of the procedure.  The patient was taken from the   operating room to the recovery room in good condition.      Revonda Standard Roxan Hockey, M.D.  Electronically Signed     SCH/MEDQ  D:  10/13/2006  T:  10/13/2006  Job:  [id number]   cc:   Burnett Harry. Joya Gaskins, MD, [place] A. Sherren Mocha, MD  [name] [name], MD, [facility]

## 2010-09-29 NOTE — Assessment & Plan Note (Signed)
[place]                             PULMONARY OFFICE NOTE   NAME:Luke Mcbride                     MRN:          [id number]  DATE:12/05/2006                            DOB:          [birth year]    Luke Mcbride is an exceptionally complicated 75 year old male with  bilateral ground glass pulmonary infiltrates, which were diagnosed  positive for bronchiolitis-obliterans organizing pneumonia on open lung  biopsy earlier in the spring of this year.  The patient was placed on  corticosteroid therapy beginning October 21, 2006.  He received 40 mg of a  slow taper.  Unfortunately, as a result of this, he developed  significant hyperglycemia and volume depletion.  He also had gastritis  and abdominal distention, noted on a visit to the nurse practitioner,  [name], November 08, 2006.  Patient has had multiple medications  adjusted.  He ultimately required hospitalization between the 25th and  29th of June for acute renal insufficiency, hypertension, diabetes.  He  was discharged with stable bronchiolitis-obliterans organizing pneumonia  and improved renal insufficiency with IV fluid hydration, thought to  have diabetes exacerbated by steroid use and also noted to have  hematuria.  During this admission, he was seen by renal in consultation,  noted to have positive serology for P-ANCA.  The original open lung  biopsy in May of this year did not show vasculitis.  His creatinine in  January, 2008 was 1.3 and was 3.6 on November 09, 2006.  He has an  underlying history of iritis, for which he received ophthalmologic  steroids by ophthalmology as well.  The patient now currently is off all  medications with the exception of Synthroid 100 mcg daily, Lantus 10  units daily.  He is finishing a course of Protonix at 40 mg daily and  finished prednisone a week ago.  He is off specifically the lisinopril  and Lipitor at this time.  He has had no further hemoptysis.  He  denies  any shortness of breath or respiratory complaints.   PHYSICAL EXAMINATION:  Temp 98, blood pressure 126/88, pulse 80,  saturation 97% on room air.  CHEST:  Clear today without adventitious breath sounds.  CARDIAC:  Regular rate and rhythm without S3.  Normal S1 and S2.  ABDOMEN:  Soft and nontender.  EXTREMITIES:  No edema, clubbing, or obesity.  SKIN:  Clear.  NEUROLOGIC:  Intact.  HEENT:  No jugular venous distention, lymphadenopathy.  Oropharynx  clear.  Neck supple.   Chest x-ray obtained today showed no acute infiltrate and specifically  showed no evidence of ground glass opacification.   IMPRESSION:  1. Resolved bronchiolitis-obliterans organizing pneumonia.  For this,      we will maintain off steroids at this time.  2. Gastritis exacerbated by steroid use.  Patient will finish a course      of Protonix.  3. Acute-on-chronic renal insufficiency.  For this, the patient is to      follow up with renal, Dr. Hassell Done.  The P-ANCA may be relevant to the      hematuria and renal insufficiency; however,  the renal insufficiency      may be more from medications than volume depletion, diabetes.  4. Diabetes:  Stable.  Off steroids, per Dr. Sherren Mocha.  A note is made of      a recent BMET on November 16, 2006 of creatinine 2.9.  He does have      repeat labs today at the renal center, which we will follow up      with.  5. Pulmonarywise, the patient is to maintain off the systemic      steroids, and we will follow the patient expectantly because of a      repeat visit in six weeks.     Burnett Harry Joya Gaskins, MD, [facility]  Electronically Signed    PEW/MedQ  DD: 12/06/2006  DT: 12/06/2006  Job #: [id number]   cc:   Dellis Filbert A. Sherren Mocha, MD  Maudie Flakes. Hassell Done, M.D.

## 2010-09-29 NOTE — Assessment & Plan Note (Signed)
Luke Mcbride                             PULMONARY OFFICE NOTE   NAME:[name]                     MRN:          [id number]  DATE:04/18/2007                            DOB:          [birth year]    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  He is improved from a pulmonary standpoint, has had no  further hemoptysis.  His creatinine is coming down nicely under the  direction of Dr. Marval Regal.  He is on Cytoxan 175 mg daily and  prednisone down to 10 mg daily.  His creatinine is down to 1.58.   EXAMINATION:  Temperature 97, blood pressure 130/80, pulse 68,  saturation 99% room air.  CHEST:  Showed diminished breath sounds, no wheeze or rhonchi.  CARDIAC:  Showed a regular rate and rhythm without S3, normal S1-S2.  ABDOMEN:  Soft, nontender.  EXTREMITIES:  Showed no edema or clubbing.   IMPRESSION:  Pauci-immune ANCA mediated glomerulonephritis with  pulmonary capillaritis alveolar hemorrhage and previous history of  bronchiolitis obliterans organized pneumonia.  The pulmonary hemorrhage  and bronchiolitis obliterans organizing pneumonia has resolved.  The  glomerulonephritis has improved.   PLAN:  Release patient back to Dr. Sherren Mocha for further primary care  followup and further followup per Dr. Marval Regal.  There is no further  pulmonary followup indicated.  If the patient has recurrent pulmonary  symptoms he is to call for a repeat visit.     Burnett Harry Joya Gaskins, MD, [facility]  Electronically Signed    PEW/MedQ  DD: 04/19/2007  DT: 04/19/2007  Job #: [id number]   cc:   Dellis Filbert A. Sherren Mocha, MD  Donato Heinz, M.D.

## 2010-09-29 NOTE — Assessment & Plan Note (Signed)
[facility]                             PULMONARY OFFICE NOTE   NAME:[name]                     MRN:          [id number]  DATE:02/02/2007                            DOB:          05-24-1935    Luke Mcbride is a 75 year old white male whom we previously saw in the  spring of this year with hemoptysis and alveolar hemorrhage.  Open lung  biopsy showed bronchiolitis obliterans with organizing pneumonia but no  specific vasculitis.  There was hemosiderin-laden macrophages, however,  in the airways.  The patient responded to corticosteroids beginning in  June 2008.  However, did require hospitalization in June for acute renal  insufficiency, hypertension, diabetes.  Felt to have renal insufficiency  on the basis of dehydration and exacerbated by steroid use with  diabetes, but also noted to have hematuria.  During that admission, he  had positive serology for P-ANCA.  There was no vasculitis on the  original open lung biopsy.  However, the creatinine did elevate.  During  the summer, he underwent renal biopsy.  The results of this show focal  necrotizing glomerulonephritis, consistent with pauci-immune ANCA  mediated etiology with capillaritis.  On this basis, the patient has  been started back on corticosteroids and Cytoxan.   CURRENT MEDICATIONS:  1. 30 mg a day prednisone.  2. Cytoxan 175 mg daily.   He is not short of breath.  Having no cough, chest pain, or any further  hemoptysis.  His creatinine apparently has dropped from 2.9 to 1.97 on  his last determination per the patient's report, and he is having no  specific complaints.   EXAM:  Temperature 97, blood pressure 130/64, pulse 54, saturation 98%  on room air.  CHEST:  Showed to be clear today without evidence of wheeze, rales, or  rhonchi.  CARDIAC:  Regular rate and rhythm without S3.  Normal S1, S2.  ABDOMEN:  Soft and nontender.  EXTREMITIES:  No clubbing or edema.  SKIN:   Clear.   IMPRESSION:  Pauci-immune ANCA mediated glomerulonephritis with  associated likely capillaritis and alveolar hemorrhage, and associated  bronchiolitis obliterans with organizing pneumonia now clearing on  corticosteroids and antiinflammatory cytotoxic agents.  Renal function  also improving.  The patient's iritis likely is also playing a role here  as well in the patient's systemic disease.   PLAN:  Will be to maintain Cytoxan and prednisone per renal, since renal  is in charge of the patient's immunosuppressives.  We will follow along  concurrently.  We will see the patient back again in 4 months' time.     Burnett Harry Joya Gaskins, MD, [facility]  Electronically Signed    PEW/MedQ  DD: 02/03/2007  DT: 02/03/2007  Job #: [id number]   cc:   Donato Heinz, M.D.  Jeffrey A. Sherren Mocha, MD  Lorelle Gibbs., M.D.

## 2010-09-29 NOTE — Assessment & Plan Note (Signed)
OFFICE VISIT   Luke Mcbride, FAILLACE  DOB:  06-29-1935                                        October 28, 2006  CHART #:  [id number]   CURRENT PROBLEMS:  1. Status post right VATS, lung biopsy for interstitial pneumonitis.  2. Status post CABG in 2002.  3. Hemoptysis.   HISTORY OF PRESENT ILLNESS:  Luke Mcbride returned for his postop visit  after undergoing right VATS and lung biopsy.  The pathology was  consistent with focal organizing pneumonitis and he has been placed on  prednisone.  He still is having some incisional pain but his breathing  has been stable and he states his hemoptysis has resolved.  He remains  on his discharge medications including aspirin, Lipitor, Niaspan,  Synthroid, lisinopril, HCTZ, and prednisone.   PHYSICAL EXAMINATION:  Blood pressure 101/60, pulse 70, respirations 18,  saturation 99%.  He is alert and comfortable.  Breath sounds are clear.  The right VATS incisions are all healed.   A chest x-ray shows clear lung fields and no pleural effusion.   IMPRESSION AND PLAN:  The patient was told he could resume driving and  light activity.  He will return for surgical follow-up as needed.   Luke Mcbride, M.D.  Electronically Signed   PV/MEDQ  D:  10/28/2006  T:  10/29/2006  Job:  [id number]   cc:   Luke Mcbride. Luke Darner, MD,FACP,FCCP  Luke Ee Sherren Mocha, MD

## 2010-09-29 NOTE — Discharge Summary (Signed)
NAME:  Luke Mcbride, Luke Mcbride              ACCOUNT NO.:  [id number]   MEDICAL RECORD NO.:  [id number]          PATIENT TYPE:  INP   LOCATION:  5123                         FACILITY:  [facility]   PHYSICIAN:  [name]. Hopper, MD,FACP,FCCPDATE OF BIRTH:  January 02[birth year] 1938   DATE OF ADMISSION:  11/09/2006  DATE OF DISCHARGE:  11/13/2006                               DISCHARGE SUMMARY   ADMITTING DIAGNOSES:  1. Renal insufficiency, acute.  2. Bronchiolitis obliterans-organizing pneumonia (BOOP).  3. Hypertension.  4. Diabetes.   DISCHARGE DIAGNOSES:  1. Renal insufficiency, improved (subacute renal failure).  2. Bronchiolitis obliterans-organizing pneumonia (BOOP), stable.  3. Diabetes exacerbated by steroids.  4. Hematuria.  5. Anemia, possible gastrointestinal blood loss.   BRIEF HISTORY:  Luke Mcbride is a 75 year old white male admitted with  renal insufficiency.  He has a complicated history and presented with  hemoptysis in the spring of 2008.  VATS biopsy by Dr. Roxan Hockey in May  revealed BOOP with no evidence of vasculitis.  Serology was positive for  ANCA.  He had been started on high-dose prednisone with subsequent wean.  His renal function has shown progressive deterioration during the last 6  months.  Specifically in January of 2008, his creatinine was 1.3; it was  found to be 3.6 on November 09, 2006.   ADDITIONAL HISTORY:  1. Iritis for which he receives ophthalmologic steroid medications      and for which he is followed by Dr. Janyth Contes.  2. He has also had renal calculi on two occasions.  3. He has had bypass surgery in 2002.  4. He has had a diagnosis of hypothyroidism for > 15 years.   Prior to admission, he had been on Diamox from Dr. Janyth Contes.  He has had  continued scopolamine eye drops, as well as Pred Forte eye drops.  At  the time of admission, his Niaspan, Lipitor, lisinopril and potassium  were held.  Additionally, hydrochlorothiazide was also discontinued.  He  has  continued on his Synthroid 0.1 mg daily and the prednisone 30 mg  daily.   His urinalysis revealed 20-50 red cells and 1+ protein.  His serial  creatinines are listed in Dr. Cherlyn Cushing nephrology consult.   He was treated with sliding scale insulin with good diabetic control.  He was instructed in the use of Lantus to be continued at home.  Perhaps  he will be able to come off the insulin once the steroids are weaned and  discontinued.   At the time of discharge, he was found to have a creatinine of 2.58 and  a BUN of 60.  Glucose was 101.  His GFR was calculated as 25.  His  hemoglobin at discharge is 8.8 and hematocrit 26.1.  White count was  12,600.  He was afebrile.   White count was recorded as high as 13,500.  He had no sign of active  infection.  The VATS scars were well-healed with no evidence of erythema  or purulence or fluctuance.  His serum iron was 64 with normals over 42.  Saturation was 29%, low normal.   The Nephrology consultant (Dr  Colodanato) was concerned about possible  blood loss anemia.  Mr. Datema has no GI symptomatology.  He has had 2  flexible sigmoidoscopies by Dr. Sherren Mocha and questionably a barium enema  which revealed no pathology.  He has been on Protonix during the  hospitalization prophylactically.   The nephrologist also was concerned about the hematuria and felt that  and urology consultation may be necessary as an outpatient.   At the time of discharge, blood pressure was 145/83.  It had ranged from  122/74 to the high as 145.  O2 saturations on room air were 96%.  Chest  was clear with no respiratory compromise.  He exhibited a regular  rhythm.  Pedal pulses were decreased and there was only trace edema.   DISCHARGE MEDICATIONS:  1. Pred Forte and scopolamine eye drops from Dr. Janyth Contes with home he      will follow up.  2. He was to continue his L thyroxine 0.1 mg daily.  3. He was to continue the prednisone wean as per Dr. Asencion Noble.  4. He  was given a prescription for Protonix 40 mg one each morning 30      minutes before breakfast.  5. He was also to employ Lantus 10 units daily.  He was to adjust this      by 1 unit every 5 days if the fasting blood sugars were averaging      over 130.  Should the fasting sugar be less than 70, he was to      decrease the Lantus by 2 units.  He was given a prescription for      Accu-Chek compact plus lancets and strips.  He was comfortable in      the  administration of the Lantus.   He expressed displeasure with the medical care, specifically stating  that adjustments of his medications was the reason for his hospital  stay.  He was concerned about how long he would be on insulin.  I stated  that it would depend on the duration and dose of his prednisone.  I  expressed that hopefully this could be weaned and he could return to  diet-controlled diabetes.   He was to see Dr. Sherren Mocha on November 17, 2006 and recheck his renal function,  CBC and differential.  Stool cards can be provided at that time.  Urinalysis can also be rechecked.  If the hematuria is persisting or  progressing, then urology consultation can be pursued.  If  FOB cards  are positive or there is progressive anemia, gastroenterologic  consultation can be considered.   There was also a history of a possible tick bite 3-4 weeks prior to  admission.  He has had no fever or rash, but he was told to monitor for  fever, headache or rash.  Empiric doxycycline would be started should he  have any such symptoms.   His discharge status is improved.  Prognosis depends on the underlying  process which is causing the renal insufficiency and BOOP.   I stressed the importance of working through Dr. Sherren Mocha to direct his  workup and treatment because of his complicated case with multiple  comorbidities.      [name]. [name], MD,FACP,FCCP  Electronically Signed     WFH/MEDQ  D:  11/13/2006  T:  11/13/2006  Job:  [id number]   cc:    Dellis Filbert A. Sherren Mocha, MD  Burnett Harry Joya Gaskins, MD, FCCP  Richard F. Hassell Done, M.D.

## 2010-09-29 NOTE — Consult Note (Signed)
NAME:  Luke, Mcbride              ACCOUNT NO.:  [id number]   MEDICAL RECORD NO.:  [id number]          PATIENT TYPE:  INP   LOCATION:  3701                         FACILITY:  [facility]   PHYSICIAN:  Maudie Flakes. Hassell Done, M.D.   DATE OF BIRTH:  [birth year]   DATE OF CONSULTATION:  11/10/2006  DATE OF DISCHARGE:                                 CONSULTATION   RENAL CONSULTATION:   HISTORY OF PRESENT ILLNESS:  [name] is a 75 year old white male  admitted because of worsening renal function.  He had hemoptysis  starting early spring.  He had VATS biopsy by Dr. Roxan Mcbride May 2008  showing BOOP; no evidence of vasculitis.  However, his serology  revealed positive ANCA (? positive MPO or PR3).  He has been treated  with prednisone 40 mg/d, then 30 mg/d recently, but renal function has  worsened (creatinine 0.25 May 2006, 1.3 May 12, 3.6 yesterday).  Renal consult was requested by Dr. Asa Lente.   PAST MEDICAL HISTORY:  1. Iritis about 30 years ago, treated with p.o., topical, and      intraocular steroids.  He has recently seen Dr. Rosana Mcbride      [facility]) for iritis.  2. Renal stone x2 - 30 years ago.  3. CABG, 2002 (Dr. Roxy Mcbride).  4. Hypertension.  5. Hypothyroidism (10-15 years).  6. Anemia.   SOCIAL HISTORY:  He was born in [place].  He graduated [employer], attended law  school for 2 years.  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  No  cigarettes, no alcohol.  He lives with his wife, and they have marred  for 47 years.   FAMILY HISTORY:  Father died age 79 of prostate cancer.  Mother died at  age 60 of diabetes.  One brother and one sister healthy.  One son, 2  daughters healthy.   REVIEW OF SYSTEMS:  Iritis acting up, some decreased force of his  urinary stream, elevated glucose with prednisone.  No gross hematuria,  no renal colic, no arthritis, no rash, no edema, no melena, no  hematochezia, no angina, no claudication, no sinus problems, no decrease  sensation in extremities.   MEDICATIONS PRIOR TO ADMISSION:  1. Diamox (out x1 week).  2. Isopto Hyoscine (scopolamine) drops 0.25% b.i.d. on the left.  3. Pred Forte t.i.d. on left.  4. Niaspan (on hold).  5. Synthroid 0.1/d.  6. Hydrochlorothiazide 25/d.  7. Lipitor 80/d (on hold).  8. Lisinopril 2.5 mg/d (on hold).  9. KCl.  10.Prednisone 30 mg a day.   MEDICATIONS IN HOSPITAL:  1. Prednisone 30 mg/d.  2. Scopolamine and Pred Forte eye drops.  3. Synthroid 0.1/d.  4. Sliding scale insulin.  5. P.r.n.'s.   PHYSICAL EXAMINATION:  GENERAL:  He is awake, alert, pale, friendly and  cooperative.  VITAL SIGNS:  Temperature 98.6, pulse 63, respirations 18, blood  pressure 110/70.  EYES:  I cannot detect iritis.  NOSE MOUTH  and PhARYNX:  Dental bridge.  No oral ulcers and no abscess.  NECK:  Carotids 2+ bilaterally without bruits.  CHEST:  Scars (3), right chest,  from VATS.  Rhonchi.  No consolidation.  Sternotomy well healed.  HEART:  No rub.  ABDOMEN:  Nontender.  GU:  Circumcised penis.  Testes descended bilaterally.  EXTREMITIES:  Saphenous vein harvest site is clean.  Zero-trace  pretibial edema.  No rash, no arthritis.  NEURO:  Right handed.  Strength equal.  Sensation intact.  Pulses 2+  bilaterally.  Bilateral femoral bruits.   LABORATORY DATA:  Hemoglobin 10.2, WBC 13,500, PLTS 187K.  Urinalysis 0-  2 white cells, 20-50 red cells, 1+ protein.  Coags not available.   DATE:  February 2006, January 2008, May 12, May 27, June 2, June 25,  June 26.  CR:  0.9  0.9  1.3  2.0  2.3  3.6  3.2   Sodium 139, potassium 4.6, chloride 107, CO2 of 24, BUN 90, creatinine  3.2, calcium 8.5.  Renal ultrasound:  Right kidney 13 cm, left kidney 12  cm.  No hydronephrosis noted.  Chest x-ray:  Bibasilar atelectasis, scar  right lower lobe from VATS.   IMPRESSION:  1. Worsening renal function in patient with a prior history of      hemoptysis (and reportedly positive ANCA), now with  proteinuria      (mild).  Microscopic hematuria, iritis, and an increase in serum      creatinine.  2. Status post coronary artery bypass graft.  3. Status post renal stones (20-30 years ago).  4. Hypothyroidism.  5. Diabetes mellitus (worse with steroids).  6. Hypertension (not high now).  7. Anemia.  8. Bilateral femoral bruits.   PLAN:  1. Check ANCA, anti-GBM, get results of prior ANCA and ANA from Dr.      Bettina Mcbride office.  Contact Dr. Janyth Mcbride for information about      iritis.  Continue prednisone.  Check coags and IV bleeding time      (will likely need renal biopsy).  Save left arm for vascular      access.  2. Refer to cardiology.  3. No stones on ultrasound now.  4. Continue Synthroid.  5. CBGs plus sliding scale insulin.  6. No meds needed now.  7. Check iron, TIBC, ferritin, treat with Aranesp plus or minus iron.  8. No suggestions.           ______________________________  Maudie Flakes. Hassell Done, M.D.     RFF/MEDQ  D:  11/10/2006  T:  11/11/2006  Job:  [id number]   cc:   Mcbride Luke. Luke Gaskins, MD, FCCP  Luke Mcbride. Luke Mcbride, M.D.  Luke November, MD  Luke Mcbride., M.D.

## 2010-09-29 NOTE — Assessment & Plan Note (Signed)
[facility] OFFICE NOTE   NAME:Spoerl, [name]                     MRN:          [id number]  DATE:08/08/2007                            DOB:          May 09, 1936    Mr. Lomboy is seen, today, for the first time in about 2 years.  He had  a previous history of coronary bypass surgery.   The patient has been through quite a bit since I last saw him.  He had  some sort of iritis which then led to generalized vasculitis of the  lungs and kidneys.  He had a lung biopsy and kidney biopsy with  significant renal failure.  He was on high-dose steroids.  He is  currently on Cytoxan, and will be switched to Imuran.  From a cardiac  standpoint he has been stable.  He was taken off his statin drug,  aspirin, and most his other medications.  He has just been put back on a  statin.  He has not been on aspirin.   He does not have any significant chest pain or PND.  His dyspnea has  improved since his vasculitis has improved.  He had an adenosine Myoview  study done today.  I reviewed it.  It was essentially normal with an EF  of 47%.  There was some thinning of the inferior base.   I told the patient that his Myoview was low risk since he is not having  chest pain, and had a low-risk Myoview.  I do not think that he needs  further cardiac workup at this time.  The patient's current medicines  include Cytoxan to be switched to Imuran.  Lipitor which was restarted  at 80 mg, amlodipine 5 mg, Levoxyl 100 mcg, potassium, and he is to  start a baby aspirin.   PHYSICAL EXAMINATION:  His exam is remarkable for a blood pressure of  130/80, pulse of 88 and regular, weight 221, respiratory rate 14.  He  has some iritis or decreased vision in the left side.  HEENT:  Otherwise unremarkable.  No carotid bruits.  No lymphadenopathy, no JVP elevation.  LUNGS:  Clear to diaphragmatic motion.  No wheezing.  S1-S2 normal heart sounds.  PMI  normal.  ABDOMEN:  Benign.  Bowel sounds positive.  No AAA.  No tenderness, no  past hepatosplenomegaly or hepatojugular reflux.  Distal pulses are intact.  He has a valgus deformity of both toes, and  may need the left fourth toe removed.  He has +1 PTs bilaterally.  NEURO:  Nonfocal.  SKIN:  Warm and dry.  No muscular weakness.   IMPRESSION:  1. Coronary disease, previous coronary artery bypass graft, stable.      Resume baby aspirin 81 mg a day, low-risk Myoview.  2. Hypercholesterolemia.  Back on Lipitor, check lipid and liver      profile in 6 months.  3. Iritis.  Currently stable, but continue with decreased vision in      the left eye, follow up with ophthalmology.  4. Vasculitis involving the lung and kidneys.  Follow up  with Dr.      Marval Regal and Dr. Joya Gaskins, probably switching to Imuran.  I suspect      they are also following his kidney function, and possible CTs.  I      will leave it up to them as to how to follow his blood work, as      well as a sed      rate.  5. From a cardiac standpoint he is stable, and I will see him back in      a year.     [name]. [name], MD, [facility]  Electronically Signed    PCN/MedQ  DD: 08/08/2007  DT: 08/08/2007  Job #: [id number]

## 2010-09-29 NOTE — Assessment & Plan Note (Signed)
[facility] OFFICE NOTE   NAME:[name]                     MRN:          [id number]  DATE:06/20/2007                            DOB:          [birth year]    PRIMARY CARE PHYSICIAN:  Dr. Sherren Mcbride.   RENAL DOCTOR:  Dr. Marval Regal.   REASON FOR REFERRAL:  Dr. Sherren Mcbride asked me to evaluate Luke Mcbride in  consultation regarding colorectal cancer screening.   HISTORY OF PRESENT ILLNESS:  Luke Mcbride is a very pleasant 75 year old  man who was recently very ill with a new diagnosis of both pulmonary and  renal vasculitis.  He was hospitalized twice.  Since then he has been on  high-dose prednisone that is slowly tapering off, as well as Cytoxan.  I  believe Dr. Marval Regal is managing his immunosuppressants, and [name] tells me that he thinks that he will be coming down off the  Cytoxan in the next 2 months.   He has no troubles with his bowels, no constipation, no diarrhea, no  bleeding, no abdominal pains.  He had sigmoidoscopy twice in the past in  the 90s, and he was told he had no polyps or tumors.   REVIEW OF SYSTEMS:  Notable for fatigue that has been going on for  months, otherwise essentially normal and is available on his nursing  intake sheet.   PAST MEDICAL HISTORY:  1. Coronary artery disease with a heart attack in 2002.  2. Diabetes diagnosed in 2008.  3. Elevated cholesterol.  4. Vasculitis, both pulmonary and renal, currently on Cytoxan and      prednisone.  5. Coronary artery bypass grafting 2002.   CURRENT MEDICINES:  1. Cytoxan.  2. Lipitor.  3. Lasix.  4. Amlodipine.  5. Levoxyl.  6. Prednisone.  7. Iron.  8. Multivitamin.   ALLERGIES:  No known drug allergies.   SOCIAL HISTORY:  Married, 3 children, nonsmoker, nondrinker.   FAMILY HISTORY:  No colon cancer or colon polyps in family.   PHYSICAL EXAMINATION:  Height 6 feet 2 inches, 218 pounds, blood  pressure 130/72,  pulse 86.  CONSTITUTIONAL:  Generally well appearing.  NEUROLOGIC:  Alert and oriented x3.  EYES:  Extraocular movements intact.  MOUTH:  Oropharynx moist, no lesions.  NECK:  Supple, no lymphadenopathy.  CARDIOVASCULAR:  Heart regular rate and rhythm.  LUNGS:  Clear to auscultation bilaterally.  ABDOMEN:  Soft, nontender, nondistended, normal bowel sounds.  EXTREMITIES:  No lower extremity edema.  SKIN:  No rashes or lesions on visible extremities.   A 75 year old man at routine risk for colorectal cancer.   I did not mention above, but he had a recent CBC 2 weeks ago showing his  hemoglobin was 12.1, his MCV was quite high at 116.  I recommend a full  colonoscopy at his soonest convenience.  He, however, wants to delay  this until he is at least coming down on the Cytoxan or completely off.  I think that is completely reasonable, especially if he is planning to  be off it in the next 2 to 3 months.  I will arrange for him to have a  return office visit with me in 10 weeks' time to reestablish time frame  for colonoscopy.  I did explain to him, though, that if he cannot come  off the Cytoxan and it looks like it is going to be 6 to 8 months, then  I would prefer to go ahead and do the colonoscopy at that point.     Luke Banister, MD  Electronically Signed    DPJ/MedQ  DD: 06/20/2007  DT: 06/20/2007  Job #: [id number]   cc:   Luke Mcbride, M.D.  Luke A. Sherren Mocha, MD

## 2010-09-29 NOTE — Assessment & Plan Note (Signed)
[place]                             PULMONARY OFFICE NOTE   NAME:Theissen, [name]                     MRN:          [id number]  DATE:11/08/2006                            DOB:          1936/01/03    HISTORY OF PRESENT ILLNESS:  The patient is a 75 year old white male  patient of Dr.  Bettina Gavia who has a known history of bilateral ground  glass pulmonary infiltrates with a positive T ANCA and hemoptysis. The  patient underwent an open lung biopsy on May 29th with the results  revealing organized pneumonia with evidence of a bronchiolitis  obliterans organized pneumonia with reactive pneumocystic changes,  fibroblastic plugs in the airway and a focal organized pneumonitis.  There was no evidence of vasculitis on any of the biopsies. The patient  was started on a prednisone taper at 40 mg on June 13. Since starting on  this, the patient has had multiple complaints of abdominal discomfort,  increased gas, constipation and nausea with decreased appetite. The  patient reports his shortness of breath and overall lung function has  substantially improved. However, he continues to have significant  stomach upset. He denies any vomiting, diarrhea, hemoptysis, hematemesis  or bloody stools. The patient has used some intermittent stool softeners  without much relief. The patient has also been noticed to have some  increased blood sugars at home. He has a history of diet-controlled  diabetes mellitus with a last hemoglobin A1c at 6.5 in January of this  year. Blood sugars have been ranging around 200 to 243. The patient's  also had recent labs and creatinine had increased from 0.9 to 2.0. The  patient has not followed with his primary care physician, Dr.  Sherren Mocha,  recently. The patient also had been recently taken off of lisinopril due  to a dry cough.   PAST MEDICAL HISTORY:  Reviewed.   CURRENT MEDICATIONS:  Reviewed.   PHYSICAL EXAMINATION:  The patient  is pleasant male in no acute  distress. He is afebrile with stable vital signs. O2 saturation is 97%  on room air.  HEENT: Unremarkable.  NECK: Supple without cervical adenopathy. No JVD.  LUNGS: Lung sounds reveal diminished breath sounds in the bases,  otherwise, clear.  CARDIAC: Regular rate and rhythm.  ABDOMEN: Soft and nontender.  EXTREMITIES: Warm without any calf cyanosis, clubbing or edema.   IMPRESSION/PLAN:  1. 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 The patient      will continue to taper down prednisone as recommended. He is      presently on 30 mg. He will followup with Dr.  Joya Gaskins as scheduled      in two weeks or sooner if needed.  2. Mild gastritis, probably secondary to prednisone. The patient will      add in a stool softener of Dulcolax 1-2 tablets at bedtime and add      in MiraLax 17 grams daily, along with Gas-X p.r.n. He will also      begin an empiric trial of Prilosec 20  mg daily along with reflux      preventative measures. The patient has been made an appointment      with his primary care physician later this week for followup.  3. Diabetes mellitus with some hyperglycemia secondary to steroids.      The patient will be made an appointment with Dr.  Sherren Mocha later this      week for followup and may need medications while he is on the      steroid taper.  4. Renal insufficiency. Questionable etiology. The patient has      recently been taken off an ACE inhibitor. The patient will followup      with Dr.  Sherren Mocha to review most recent lab results and followup BMET.      [name], NP  Electronically Signed      Burnett Harry Joya Gaskins, MD, [facility]  Electronically Signed   TP/MedQ  DD: 11/08/2006  DT: 11/08/2006  Job #: [id number]

## 2010-09-29 NOTE — Assessment & Plan Note (Signed)
[place]                             PULMONARY OFFICE NOTE   NAME:[name]                     MRN:          [id number]  DATE:10/21/2006                            DOB:          Dec 07, 1935    [name] returns today in followup.  He is a 75 year old white male  with bilateral ground glass pulmonary infiltrates, positive T ANCA, and  hemoptysis.  He underwent open lung biopsy on the 29th of May.  Results  of this reveal organized pneumonia, hemorrhage associated with  hemosiderin-laden macrophages and respiratory bronchiolitis.  Putting  all this together, I suspect he has a form of bronchiolitis obliterans  organized pneumonia with reactive pneumocytic changes, fibroblastic  plugs in the airway, and focal organized pneumonitis.  There is no  evidence of vasculitis on any of the biopsies.  He continues to cough up  small amounts of blood and is still dyspneic and quite weak and  fatigued.   PHYSICAL EXAMINATION:  Temp 98, blood pressure 124/70, pulse 92,  saturation 97% on room air.  CHEST:  Showed rales at the bases.  Poor air flow.  CARDIAC EXAM:  Showed a regular rate and rhythm without S3, normal S1,  S2.   IMPRESSION:  Impression on this patient is that of bronchiolitis  obliterans organized pneumonia with alveolar hemorrhage syndrome, no  evidence of vasculitis.   PLAN:  Begin corticosteroid pulse on October 28, 2006 at 40 mg daily.  Taper down slowly until he gets to 10 mg a day and hold.  We will see  the patient back in followup in 6 weeks.     Burnett Harry Joya Gaskins, MD, [facility]  Electronically Signed    PEW/MedQ  DD: 10/21/2006  DT: 10/21/2006  Job #: [id number]   cc:   Dellis Filbert A. Sherren Mocha, MD  Revonda Standard Roxan Hockey, M.D.

## 2010-09-29 NOTE — Discharge Summary (Signed)
NAME:  Luke Mcbride, Luke Mcbride NO.:  [id number]   MEDICAL RECORD NO.:  [id number]          PATIENT TYPE:  INP   LOCATION:  [id number]                         FACILITY:  [facility]   PHYSICIAN:  Revonda Standard. Roxan Hockey, M.D.DATE OF BIRTH:  [birth year]   DATE OF ADMISSION:  10/13/2006  DATE OF DISCHARGE:  10/17/2006                               DISCHARGE SUMMARY   FINAL DIAGNOSIS:  Hemoptysis, pulmonary opacities.  In the past, shows  respiratory bronchiolitis with hemorrhage associated with hemosiderin  laden macrophages, focal organizing pneumonitis.   SECONDARY DIAGNOSES:  1. Coronary artery disease, status post coronary artery bypass      grafting by Dr. Roxy Manns in 2002.  2. Hyperlipidemia.  3. Chronic left eye problems.  4. Hypothyroidism.  5. Previous cataract surgery bilaterally.   IN HOSPITAL OPERATIONS/PROCEDURES:  Right video-assisted thoracoscopic  surgery with lung biopsy x3.   HISTORY AND PHYSICAL/HOSPITAL COURSE:  Luke Mcbride is a 76 year old  gentleman who is a nonsmoker with no previous history of lung disease.  Over the past 2-3 weeks he has had onset of cough with discomfort in his  left chest, dyspnea with exertion.  The cough has had blood streaked  mucus.  He has also noticed that he has been tiring very easily.  Patient has been evaluated by Dr. [name], who recently cleared him from  a cardiac standpoint.  Patient's major concern was his productive cough  and the fact that he had blood in his mucus.  He was being worked up for  that when he mentioned a history of __________, which has recently  flared up.  This is a relative suspicion of possible sarcoid lung  disease.  The patient was seen and evaluated by Dr. Roxan Hockey.  A  diagnostic CT scan shows ground glass opacities in both lower lobes in a  patchy distribution.  Sed rate done showed to be 51 and rheumatoid  factor is less than 20.  Dr. Roxan Hockey discussed with patient  undergoing video-assisted  thoracoscopic surgery with lung biopsy for  further diagnostic proceedings.  He discussed the risks and benefits  with patient.  The patient acknowledges understanding and agreed to  proceed.  The surgery was scheduled for Oct 13, 2006.  For details of  patient's past medical history and physical exam, please see dictated  H&P.   The patient was taken to the operating room on Oct 13, 2006, where he  underwent right video-assisted thoracoscopic surgery with lung biopsy  x3.  The patient tolerated the procedure well and was transferred to the  intensive care unit in stable condition.  The patient's pathology report  came back showing hemorrhage associated with hemosiderin laden  macrophages, as well as focal organizing pneumonitis and respiratory  bronchiolitis.  Postoperatively, pulmonary was consulted.  Pulmonary  started patient on steroids for a length of 2 weeks postop day 1.  Postop day 1, patient's vital signs were stable.  Patient, from a  pulmonary standpoint, was saturating well on 2 liters nasal cannula.  Chest x-ray showed no pneumothorax atelectasis.  Patient's chest tube  remained postop day 1.  By postop  day 2, minimal drainage from chest  tube with improving chest x-ray.  Chest tube was discontinued postop day  2.  Followup chest x-ray showed no pneumothorax.  Patient was able to be  weaned off oxygen saturating greater than 90% on room air.  He remained  afebrile.  Patient remained hemodynamically stable during postoperative  course.  He remained in normal sinus rhythm.  Pulmonary status  improving.  Patient was out of bed and ambulating well postoperatively.  He was tolerating diet well with no nausea or vomiting noted.  He did  develop increase in his creatinine to 2.31 postop day 4.  Patient has a  baseline creatinine of 2.0.  Plan for patient to follow up with  nephrology as outpatient.   LABORATORY DATA:  A CBG, on May 31, showed a white count of 12.2,  hemoglobin  of 10.0, hematocrit of 30.0, platelet count of 179.  BMP, on  June 2, showed a sodium of 134, potassium 2.9, chloride of 106,  bicarbonate of 21, BUN of 36, creatinine 2.01, glucose of 132.   Patient was discharged to home in stable condition on October 17, 2006.   FOLLOWUP APPOINTMENT:  A followup appointment was arranged with Dr.  Roxan Hockey for October 30, 2006 at 11:30.  Patient will need to follow up  with Dr. Joya Gaskins on October 21, 2006 at 2:50 p.m.   ACTIVITY:  Patient instructed no driving until released to do so.  No  heavy lifting over 10 pounds.  He was told to ambulate 3-4 times per  day, progress as tolerated and continue his breathing exercises.   INCISIONAL CARE:  The patient is told to shower, washing his incisions  using soap and water.  He is to contact the office if he develops any  drainage or openings from any of his incision sites.   DIET:  The patient educated on diet to low-fat, low-salt.   DISCHARGE MEDICATIONS:  1. Tylox 1-2 tabs q.4-6 hours p.r.n. pain  2. Lipitor 80 mg at night.  3. Niaspan 1000 mg at night.  4. HCTZ 25 mg daily.  5. Potassium chloride 20 mEq daily.  6. Levoxyl 0.1 mg daily.  7. Lisinopril 2.5 mg daily.  8. Diamox 500 mg daily.  9. Pred Forte 1 drop left eye at night.  10.__________ 1 drop left eye twice a day.  11.Aspirin 325 mg daily.      Darlin Coco, PA      Revonda Standard. Roxan Hockey, M.D.  Electronically Signed    KMD/MEDQ  D:  12/07/2006  T:  12/07/2006  Job:  [id number]   cc:   Burnett Harry. Joya Gaskins, MD, FCCP

## 2010-09-30 ENCOUNTER — Encounter: Payer: Self-pay | Admitting: Critical Care Medicine

## 2010-10-02 NOTE — Cardiovascular Report (Signed)
[place]  Patient:    Luke Mcbride, Luke Mcbride                     MRN: [id number] Proc. Date: 05/23/00 Adm. Date:  [id number] Attending:  [name] CC:         [name]. [name], M.D. [facility]  [name]. [name], M.D. [facility]  [name], M.D. [facility] [address]   Cardiac Catheterization  PROCEDURES PERFORMED: 1. Left heart catheterization. 2. Left ventriculogram. 3. Selective coronary angiography.  DIAGNOSES: 1. Two-vessel coronary artery disease 2. Normal left ventricular systolic function. 3. Status post non-Q-wave myocardial infarction.  INDICATIONS:  Luke Mcbride is a 75 year old white male without prior cardiac history who presents with substernal chest discomfort.  The patient describes pain started after shoveling snow and was associated with shortness of breath. He had several episodes of recurrence of pain, and was finally admitted to the hospital with unstable angina.  He subsequently ruled in for a non-Q-wave myocardial infarction.  He presents now for cardiac catheterization.  TECHNIQUE:  After informed consent was obtained, the patient was brought to the catheterization lab.  The right groin was used and a 6 [place] sheath placed and a preformed Judkins catheters were then used to perform left heart catheterization in the usual fashion.  The patient tolerated the procedure well.  The sheath was removed and manual pressure applied until adequate hemostasis was achieved.  He was then transferred to the floor in stable condition.  RESULTS:  Results are as follows: 1. Left main trunk:  The left main trunk is a large caliber vessel, diffuse    disease of not greater than 30%. 2. Left anterior descending:  This is a large caliber vessel that provides    several diagonal branches along its course.  There is moderate diffuse    disease in the proximal mid section of the LAD with narrowings of 30-40%    The proximal two diagonal branches have  ostial narrowings of 40-50% 3. Left circumflex artery:  The left circumflex artery is a medium caliber    vessel that provides a small first marginal branch in its proximal    segment, a larger second marginal branch distally.  There is a high-grade    ostial narrowing of 70-80% encompassing the first marginal branch. 4. Right coronary artery:  The right coronary artery is dominant.  This is a    large caliber vessel that provides the posterior descending artery and a    posterior ventricular branch in its terminal segment.  The right coronary    artery has diffuse disease of 30% in the mid section.  The posterior    descending artery has an ostial narrowing of 90%.  LEFT VENTRICULOGRAM:  Normal end-systolic and end-diastolic dimensions. Overall left ventricular function is well preserved with an ejection fraction of greater than 55%.  No mitral regurgitation.  LV pressure is 128/10, aortic is 128/80, LVEDP equals 20.  ASSESSMENT AND PLAN:  Luke Mcbride is a 75 year old gentleman, status post non-Q-wave myocardial infarction who has severe two-vessel coronary artery disease and well preserved left ventricular function.  Further review of the films and consultation will be requested to consider treatment options including coronary artery bypass graft surgery versus percutaneous intervention. DD:  05/23/00 TD:  05/23/00 Job: [id number]

## 2010-10-02 NOTE — Assessment & Plan Note (Signed)
[place] OFFICE NOTE   NAME:Luke Mcbride, Luke Mcbride                     MRN:          [id number]  DATE:07/29/2006                            DOB:          [birth year]    HISTORY:  Mr. Crouse is seen today for the first time by me since 2002.  The patient seems to think he was seen on 2007.  Our last note was from  2006, when he saw Murray Hodgkins the [place].  He had coronary bypass  surgery in 2002.  He has good LV function.   Coronary risk factors include hypertension and hyperlipidemia.  He is  apparently a borderline diabetic, and this is being followed by Dr.  Sherren Mocha.  He is on high dose Lipitor at 80 mg a day (which was increased to  this dose last year).  Dr. Sherren Mocha has been following his liver tests.  The  patient does not have any significant PND or orthopnea.  He has not had  any significant chest pain.  I told him particularly if he is a  borderline diabetic he should have a Myoview every 3 years status post  CABG.   He has gained some weight recently.  He needs to be more physically  active.   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  His office is in [place], but he works out of the house mostly.  He is happily married with older and grown children.  His grandkids  spend some time with him; he has an 1-month and a 9-month-old  particularly at the house.   REVIEW OF SYSTEMS:  Remarkable for no significant chest pain, PND or  orthopnea.   MEDICATIONS:  1. Aspirin daily.  2. Lipitor 80 mg daily.  3. Niaspan 1 gram daily.  4. Synthroid 100 mcg daily.  5. Potassium 20 mEq daily.  6. Lisinopril 2.5 mg daily.  7. Hydrochlorothalodone 25 mg daily.   PHYSICAL EXAMINATION:  VITAL SIGNS:  Blood pressure 120/72, pulse 86 and  regular.  HEENT:  Normal.  NECK:  Carotids are without bruits.  LUNGS:  Clear.  CARDIOVASCULAR:  Normal S1, S2 with normal heart sounds.  ABDOMEN:  Benign.  EXTREMITIES:  Lower extremity intact pulses; no edema.   EKG:  Essentially normal, with a question of an old MI; with a problem  in Q-wave in lead 3.   IMPRESSION:  Stable coronary artery disease status post coronary artery  bypass grafting.  Good risk factor modifications.   PLAN:  Follow up with Dr. Sherren Mocha regarding his hemoglobin A1c and need for  diabetes therapy.  Increase exercise program and walking, particularly  in the springtime as the weather gets nicer.   Continue current medical therapy, including high dose statin, beta  blocker or high dose statin and aspirin.  The patient is on low dose  lisinopril for protein sparing effect on his kidneys.  He is currently  not on a beta blocker.  He has good LV function and his heart rate is in  the 80s.  We may need to  add this in the future.  However, currently I  think his medical regimen is adequate.   He will have his TSH and T4 checked by Dr. Sherren Mocha, and he is now on  replacement for quite some time.   I will see him back in one year when he has Myoview.     [name]. [name], MD, [facility]  Electronically Signed    PCN/MedQ  DD: 07/29/2006  DT: 07/31/2006  Job #: [id number]

## 2010-10-02 NOTE — Discharge Summary (Signed)
[facility]  Patient:    Luke Mcbride, Luke Mcbride                     MRN: [id number] Adm. Date:  [id number] Disc. Date: [id number] Attending:  Darylene Price Dictator:   Lestine Box, RNFA CC:         [name], M.D. [facility]  [name]. [name], M.D. [facility]  Collier Salina C. [name], M.D. [facility]   Discharge Summary  DATE OF BIRTH:  November 14, 1935.  ADMITTING DIAGNOSES: 1. Chest pain, non-Q-wave myocardial infarction. 2. Bronchitis.  PAST MEDICAL HISTORY: 1. Hypothyroidism. 2. Status post bilateral cataract surgery.  ALLERGIES:  No known drug allergies.  DISCHARGE DIAGNOSES: 1. Severe three vessel coronary artery disease, status post coronary artery    bypass grafting. 2. Postoperative fluid overload, resolving.  HOSPITAL COURSE/PROCEDURES:  On May 22, 2000, [name] was a 75 year old old who was admitted to [facility] by Dr. [name] with new onset of substernal chest pain after shoveling snow on the previous day.  He was ruled in for a non-Q-wave MI.  On January 7, he underwent a cardiac catheterization with Dr. [name] which revealed three vessel coronary artery disease with preserved left ventricular function.  On January 8, cardiac surgery consult was obtained with Dr. Darylene Price who recommended coronary artery bypass grafting for the treatment choice for this gentleman.  Also on January 8, [name] underwent Doppler studies which revealed no significant carotid artery disease and he was noted to have bilateral palpable pedal pulses.  On January 9, [name] underwent uncomplicated coronary artery bypass grafting x 6 with Dr. Darylene Price.  Grafts placed at the time of procedure of the left internal mammary artery to the distal LAD, saphenous venous graft to the first diagonal, saphenous venous graft in a sequential fashion to the first circumflex marginal and the second circumflex marginal, a saphenous vein was grafted in  sequential fashion to the PD and PL.  At the conclusion of the procedure he was transferred to the SICU in stable condition.  [name] postoperative course has been uneventful.  He has made very good progress. His bronchitis has resolved.  He continues to make good progress.  He is anticipated to be discharged home tomorrow, May 31, 2000.  CONDITION AND INSTRUCTIONS ON DISCHARGE:  [name] condition is much improved, his bronchitis has cleared.  He has been instructed regarding activity, diet, wound care, followup appointments, and medications.  MEDICATIONS ON DISCHARGE: 1. Enteric coated aspirin 325 mg p.o. q d. 2. Percocet 1-2 p.o. q 4-6h p.r.n. 3. Lopressor 25 mg p.o. b.i.d. 4. Lasix 40 mg p.o. q d x 7 days. 5. Potassium chloride 20 mEq p.o. q d x 7 days. 6. Ferrous sulfate 325 mg p.o. t.i.d. 7. Folic acid 1 mg p.o. q d.  He has been instructed to resume his home medications of: 1. Synthroid 0.1 mg q d. 2. Zocor 40 mg q d.  FOLLOWUP:  [name] was given an appointment to see Dr. [name] in [facility] office in approximately two weeks and have a chest x-ray taken at that time.  He also has an appointment to see Dr. Ricard Dillon at the [place] office on Monday, February 4 at 9:00 a.m. DD:  05/30/00 TD:  05/31/00 Job: [id number]

## 2010-10-02 NOTE — Op Note (Signed)
[place]. [facility]  Patient:    Luke Mcbride, Luke Mcbride                     MRN: [id number] Proc. Date: 05/25/00 Adm. Date:  [id number] Attending:  Darylene Price CC:         [name]. [name], M.D. [facility]  [name], M.D. [facility]  [name], M.D. [facility]  [name]. [name], M.D. [facility]  CVTS office   Operative Report  PREOPERATIVE DIAGNOSIS:  Severe three-vessel coronary artery disease, status post acute non-Q wave myocardial infarction.  POSTOPERATIVE DIAGNOSIS:  Severe three-vessel coronary artery disease, status post acute non-Q wave myocardial infarction.  OPERATION PERFORMED:  Median sternotomy for coronary artery bypass grafting x 6 (left internal mammary artery to distal left anterior descending coronary artery, saphenous vein graft to first diagonal branch, saphenous vein graft to first circumflex marginal branch and sequential saphenous vein graft to second circumflex marginal branch, saphenous vein graft to posterior descending coronary artery and sequential saphenous vein graft to the right posterolateral branch).  SURGEON:  [name]. Roxy Manns, M.D.  ASSISTANT: 1. [name], M.D. 2. [name], P.A.  ANESTHESIA:  General.  INDICATIONS FOR PROCEDURE:  The patient is a 75 year old white male followed by Dr. [name] and Dr. Jenkins Rouge and referred by Dr. [name] for management of coronary artery disease.  The patient has no previous cardiac history but was admitted to the hospital on May 21, 2000 with new onset class 4 unstable angina.  He was admitted by Dr. [name].  The patient ruled in for an acute non-Q wave myocardial infarction.  Cardiac catheterization performed by Dr. [name] demonstrated severe three-vessel coronary artery disease with preserved left ventricular function.  OPERATIVE CONSENT:  The patient and the family were counseled at length regarding the indications and potential benefits of  coronary artery bypass grafting.  He understands the associated risks of surgery including but not limited to the risks of death, stroke, myocardial infarction, bleeding requiring blood transfusion, arrhythmia, infection and recurrent coronary artery disease.  He accepts these risks and the patient desires to proceed with the surgery as described.  DESCRIPTION OF PROCEDURE:  The patient was brought to the operating room on the above-mentioned date and invasive hemodynamic monitoring was established by the anesthesia service under the care and direction of Dr. [name]. The patient was placed in the supine position on the operating table. Intravenous antibiotics were administered.  Following induction with general endotracheal anesthesia, the patients chest, abdomen, both groins and both lower extremities were prepped and draped in a sterile manner.  A median sternotomy incision was performed and the left internal mammary artery was dissected from the chest wall and prepared for bypass grafting. The left internal mammary artery was good quality conduit for bypass grafting. Simultaneously saphenous vein was obtained from the patients right lower extremity through a series of longitudinal incisions.  The saphenous vein was felt to be a good quality conduit although it is somewhat large caliber.  The patient was heparinized systemically.  The pericardium was opened.  The ascending aorta was inspected and was notably free of any palpable plaques or calcifications.  The ascending aorta and the right atrial appendage were cannulated for cardiopulmonary bypass without difficulty.  Adequate heparinization was verified.  Cardiopulmonary bypass was begun and the surface of the heart was inspected. Distal sites were selected for coronary artery bypass grafting.  Portions of saphenous vein and the  left internal mammary artery were all trimmed to appropriate length.  A temperature probe was  placed in the left ventricular septum and a styrofoam pad was placed to protect the left phrenic nerve from thermal injury.  A cardioplegia catheter was placed in the ascending aorta.  The patient was cooled to 32 degrees systemic temperature.  The aortic crossclamp was applied and cardioplegia was delivered in antegrade fashion through the aortic root.  Iced saline slush was applied for topical hypothermia.  The initial cardioplegic arrest was rapid and myocardial cooling was excellent.  Subsequent doses of cardioplegia were intermittently administered throughout the crossclamp portion of the operation both through the aortic root and down subsequently placed vein grafts to maintain septal temperature below 15 degrees centigrade.  The following distal coronary anastomoses were performed:  (1) The posterior descending coronary artery was grafted with a saphenous vein graft in a side-to-side fashion using running 7-0 Prolene suture. This coronary measures 1.5 mm in diameter and is of good quality.  (2) The posterolateral branch off the distal right coronary artery was grafted using a sequental saphenous vein graft off of the vein placed to the posterior descending coronary artery.  This coronary measures 1.5 mm in diameter and is of good quality.  (3) The first circumflex marginal branch was grafted with a saphenous vein graft in a side-to-side fashion using running 7-0 Prolene suture.  This coronary measures 1.4 mm in diameter and is of good quality.  (4) The second circumflex marginal branch was grafted using a sequential saphenous vein graft off the vein placed to the first circumflex marginal branch.  This coronary measured 1.7 mm in diameter and is of good  quality.  (5) The first diagonal branch off the left anterior descending coronary artery was grafted with the saphenous vein graft in an end-to-side fashion using running 7-0 Prolene suture.  This coronary measures 1.4 mmi  n diameter and  is of good quality.  (6) Distal left anterior descending coronary artery was grafted with the left internal mammary artery using running 8-0 Prolene suture.  This coronary measures 2 mm in diameter and was of good quality at the site of distal bypass although there is visible plaque within this vessel both proximal and distal to the site of anastomosis.  This is nonobstructive and a 2 mm probe will pass in both directions equally.  All three proximal saphenous vein anastomoses are performed directly to the ascending prior to removal of the aortic crossclamp.  The septal temperature was noted to rise rapidly and dramatically upon reperfusion of the left internal mammary artery.  The aortic crossclamp was removed after a total crossclamp time of 77 minutes.  All proximal and distal anastomoses were inspected for hemostasis and appropriate graft orientation.  The heart began to beat spontaneously without need for for cardioversion.  Epicardial pacing wires were fixed to the right ventricular outflow tract and to the right atrial appendage.  The patient was rewarmed to greater than 37 degrees centigrade temperature. The patient was weaned from cardiopulmonary bypass without difficulty.  The patients rhythm at separation from bypass is normal sinus rhythm.  No inotropic support is required.  Total cardiopulmonary bypass time for the operation was 102 minutes.  The venous and arterial cannulae were both removed uneventfully.  Protamine was administered to reverse the anticoagulation.  The mediastinum and left chest were irrigated with saline solution containing vancomycin.  Meticulous surgical hemostasis was ascertained.  The mediastinum and the left chest were drained with  three chest tubes placed through separate stab incisions inferiorly.  The median sternotomy was closed in routine fashion.  The right lower extremity incisions were closed in multiple layers in routine  fashion.  All skin incisions were closed with subcuticular skin closures.  The patient tolerated the procedure well and was transported to the surgical intensive care unit in stable condition.  There were no intraoperative complications.  Sponge, needle and instrument counts were verified correct at the end of the operation.  No autologous blood products were administered.  DD: 05/25/00 TD:  05/25/00 Job: [id number]

## 2010-10-02 NOTE — H&P (Signed)
[place]. [facility]  Patient:    Luke Mcbride, Luke Mcbride                     MRN: [id number] Adm. Date:  [id number] Attending:  [name] CC:         [name], M.D. [facility]   History and Physical  CHIEF COMPLAINT: Luke Mcbride is a 75 year old white male admitted with unstable angina.  HISTORY OF PRESENT ILLNESS: He began to have substernal pressure type pain which had gradual onset after shoveling snow on May 21, 2000.  He denies any radiation but has had a cold sweat and associated shortness of breath.  He took one aspirin and the pain resolved after 30 minutes.  It recurred the evening of May 21, 2000 and again resolved with aspirin. He has had two recurrences today - at 3 p.m. and 8:30 p.m., for which he took aspirin as well.  The pain was progressive this evening and lasted 45-60 minutes, with diaphoresis and shortness of breath.  There was also some possible radiation to his shoulder which he described as "weakness."  PAST MEDICAL HISTORY:  1. Hypothyroidism.  2. 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  CURRENT MEDICATIONS:  1. He is on Synthroid for hypothyroidism and is unsure as to the dosage.  2. He had been taking NyQuil and Robitussin for a "cold" until five days     ago.  ALLERGIES: No known drug allergies.  SOCIAL HISTORY: He has never smoked.  He does not drink.  He works in [employer] exposures.  He does not exercise.  FAMILY HISTORY: Patent foramen ovale in his mother, which was closed surgically.  She also had diabetes.  No history of myocardial infarction or stroke.  His father died with prostate cancer.  REVIEW OF SYSTEMS: Some purulent sputum.  He has some occasional dyspepsia. He was on Zocor for hyperlipidemia but stopped this six months ago without consulting Luke Mcbride.  The patient felt he was having  some myalgias.  Despite the fact that he is sedentary he elected to shovel snow on May 21, 2000.  PHYSICAL EXAMINATION:  VITAL SIGNS: Respiratory rate 19 and unlabored, blood pressure 162/92, pulse 73.  GENERAL: He is unshaven and appears deconditioned.  HEENT: Arteriolar narrowing is noted.  There is asymmetry of the pupils, with the left distorted.  There is resting exotropia on the left.  There is increased cerumen in the otic canals.  He has a lower partial.  NECK: No carotid bruits.  Thyroid is small to palpation.  CHEST: Clear.  HEART: An S4 is noted without dysrhythmia.  ABDOMEN: Protuberant, hypertympanitic.  Bowel sounds are present.  There is no organomegaly.  EXTREMITIES: Homans sign negative.  Right dorsalis pedis pulse is decreased. The left second toe overlaps the first toe.  SKIN: Pockmarked.  LABORATORY DATA: Electrocardiogram reveals no acute ST-T wave changes.  Hematocrit 45.8, WBC 10,600.  Potassium 3.7, glucose 141, BUN 14, creatinine 0.9.  Total CPK 154 (normal less than 195), MB 1.7 (normal 0.3-4.0).  Troponin 0.2 (normal less than 0.04).  IMPRESSION/PLAN: He will be admitted to the unit and cardiology consulted. Zithromax will be initiated for bronchitis although the chest x-ray revealed no acute process.  He has taken adequate aspirin prophylaxis.  He will be placed on heparin protocol.  Nitroglycerin drip will be initiated should  he have recurrence of angina. DD:  05/23/00 TD:  05/23/00 Job: [id number]

## 2011-02-11 LAB — DIFFERENTIAL
Basophils Absolute: 0
Basophils Absolute: 0
Basophils Relative: 0
Eosinophils Absolute: 0
Eosinophils Absolute: 0
Eosinophils Absolute: 0.1
Lymphocytes Relative: 2 — ABNORMAL LOW
Monocytes Absolute: 0.2
Monocytes Relative: 3
Monocytes Relative: 6
Neutro Abs: 13.4 — ABNORMAL HIGH
Neutro Abs: 3.5
Neutrophils Relative %: 92 — ABNORMAL HIGH
Neutrophils Relative %: 93 — ABNORMAL HIGH

## 2011-02-11 LAB — PREPARE FRESH FROZEN PLASMA

## 2011-02-11 LAB — COMPREHENSIVE METABOLIC PANEL
ALT: 27
ALT: 41
AST: 24
Albumin: 2.9 — ABNORMAL LOW
Albumin: 4.3
Alkaline Phosphatase: 47
BUN: 69 — ABNORMAL HIGH
Chloride: 109
Creatinine, Ser: 2.15 — ABNORMAL HIGH
GFR calc Af Amer: 37 — ABNORMAL LOW
Potassium: 3.2 — ABNORMAL LOW
Potassium: 4
Sodium: 139
Sodium: 141
Total Bilirubin: 1.1
Total Protein: 5.5 — ABNORMAL LOW

## 2011-02-11 LAB — BASIC METABOLIC PANEL
BUN: 71 — ABNORMAL HIGH
Chloride: 108
Creatinine, Ser: 2.3 — ABNORMAL HIGH
Glucose, Bld: 122 — ABNORMAL HIGH

## 2011-02-11 LAB — CBC
HCT: 19.9 — ABNORMAL LOW
HCT: 22.8 — ABNORMAL LOW
HCT: 25.1 — ABNORMAL LOW
HCT: 26 — ABNORMAL LOW
Hemoglobin: 6.2 — CL
Hemoglobin: 8.1 — ABNORMAL LOW
Hemoglobin: 8.5 — ABNORMAL LOW
Hemoglobin: 8.9 — ABNORMAL LOW
MCHC: 34.4
MCHC: 34.7
MCHC: 34.8
MCHC: 34.8
MCV: 102.4 — ABNORMAL HIGH
MCV: 106.4 — ABNORMAL HIGH
Platelets: 107 — ABNORMAL LOW
Platelets: 149 — ABNORMAL LOW
Platelets: 90 — ABNORMAL LOW
RBC: 2.04 — ABNORMAL LOW
RBC: 2.46 — ABNORMAL LOW
RDW: 18 — ABNORMAL HIGH
RDW: 18.3 — ABNORMAL HIGH
RDW: 23.1 — ABNORMAL HIGH
RDW: 23.3 — ABNORMAL HIGH
RDW: 25.4 — ABNORMAL HIGH
RDW: 25.4 — ABNORMAL HIGH
WBC: 10.8 — ABNORMAL HIGH
WBC: 14.6 — ABNORMAL HIGH
WBC: 6.8

## 2011-02-11 LAB — RENAL FUNCTION PANEL
Albumin: 3.7
Albumin: 3.7
Albumin: 4.2
BUN: 54 — ABNORMAL HIGH
BUN: 62 — ABNORMAL HIGH
CO2: 21
CO2: 21
Calcium: 8.2 — ABNORMAL LOW
Calcium: 8.3 — ABNORMAL LOW
Calcium: 8.7
Chloride: 106
Chloride: 107
Chloride: 108
Creatinine, Ser: 2.05 — ABNORMAL HIGH
GFR calc Af Amer: 37 — ABNORMAL LOW
GFR calc Af Amer: 38 — ABNORMAL LOW
GFR calc Af Amer: 39 — ABNORMAL LOW
GFR calc non Af Amer: 30 — ABNORMAL LOW
GFR calc non Af Amer: 31 — ABNORMAL LOW
GFR calc non Af Amer: 32 — ABNORMAL LOW
Glucose, Bld: 125 — ABNORMAL HIGH
Glucose, Bld: 128 — ABNORMAL HIGH
Glucose, Bld: 146 — ABNORMAL HIGH
Phosphorus: 4.7 — ABNORMAL HIGH
Phosphorus: 4.9 — ABNORMAL HIGH
Phosphorus: 5.9 — ABNORMAL HIGH
Potassium: 3.4 — ABNORMAL LOW
Potassium: 3.8
Potassium: 4.4
Sodium: 135
Sodium: 136
Sodium: 137
Sodium: 138

## 2011-02-11 LAB — SAMPLE TO BLOOD BANK

## 2011-02-11 LAB — CROSSMATCH
ABO/RH(D): A POS
Antibody Screen: NEGATIVE

## 2011-02-11 LAB — APTT: aPTT: 24

## 2011-02-11 LAB — PROTIME-INR: INR: 1.1

## 2011-03-01 LAB — CROSSMATCH: ABO/RH(D): A POS

## 2011-03-01 LAB — CBC
MCHC: 33.4
MCHC: 33.6
MCV: 88.8
MCV: 89.2
Platelets: 183
Platelets: 218
RBC: 2.74 — ABNORMAL LOW
RBC: 2.77 — ABNORMAL LOW
WBC: 4.9
WBC: 5

## 2011-03-01 LAB — PROTIME-INR: Prothrombin Time: 13.5

## 2011-03-01 LAB — COMPREHENSIVE METABOLIC PANEL
AST: 20
Albumin: 3.1 — ABNORMAL LOW
Calcium: 8.5
Creatinine, Ser: 3.5 — ABNORMAL HIGH
GFR calc Af Amer: 21 — ABNORMAL LOW

## 2011-03-01 LAB — BLEEDING TIME: Bleeding Time: 7.5

## 2011-03-01 LAB — DIFFERENTIAL
Eosinophils Relative: 1
Lymphocytes Relative: 16
Lymphs Abs: 0.8
Monocytes Absolute: 0.5

## 2011-03-03 LAB — RENAL FUNCTION PANEL
CO2: 22
CO2: 23
Calcium: 8.2 — ABNORMAL LOW
Chloride: 109
Chloride: 111
Creatinine, Ser: 2.58 — ABNORMAL HIGH
Glucose, Bld: 101 — ABNORMAL HIGH
Glucose, Bld: 116 — ABNORMAL HIGH
Potassium: 4.1
Sodium: 138
Sodium: 140

## 2011-03-03 LAB — BASIC METABOLIC PANEL
BUN: 90 — ABNORMAL HIGH
CO2: 23
Calcium: 8.2 — ABNORMAL LOW
Chloride: 107
Chloride: 107
Creatinine, Ser: 3.19 — ABNORMAL HIGH
GFR calc Af Amer: 24 — ABNORMAL LOW
GFR calc Af Amer: 24 — ABNORMAL LOW
GFR calc non Af Amer: 19 — ABNORMAL LOW
Potassium: 4.4
Sodium: 137

## 2011-03-03 LAB — CBC
HCT: 26.7 — ABNORMAL LOW
HCT: 32.5 — ABNORMAL LOW
Hemoglobin: 10.9 — ABNORMAL LOW
Hemoglobin: 8.8 — ABNORMAL LOW
Hemoglobin: 9.5 — ABNORMAL LOW
MCHC: 33.3
MCHC: 33.6
MCV: 88.5
MCV: 88.7
MCV: 89.1
MCV: 90
Platelets: 187
Platelets: 225
RBC: 2.94 — ABNORMAL LOW
RBC: 3.02 — ABNORMAL LOW
RBC: 3.17 — ABNORMAL LOW
RBC: 3.42 — ABNORMAL LOW
RBC: 3.61 — ABNORMAL LOW
WBC: 13.3 — ABNORMAL HIGH
WBC: 13.5 — ABNORMAL HIGH
WBC: 15.3 — ABNORMAL HIGH

## 2011-03-03 LAB — URINALYSIS, ROUTINE W REFLEX MICROSCOPIC
Bilirubin Urine: NEGATIVE
Glucose, UA: NEGATIVE
Ketones, ur: NEGATIVE
Leukocytes, UA: NEGATIVE
Nitrite: NEGATIVE
Nitrite: NEGATIVE
Specific Gravity, Urine: 1.015
Specific Gravity, Urine: 1.018
Urobilinogen, UA: 0.2
pH: 5
pH: 5

## 2011-03-03 LAB — I-STAT 8, (EC8 V) (CONVERTED LAB)
Chloride: 105
Glucose, Bld: 123 — ABNORMAL HIGH
Hemoglobin: 11.9 — ABNORMAL LOW
Potassium: 4.2
Sodium: 135
pH, Ven: 7.377 — ABNORMAL HIGH

## 2011-03-03 LAB — DIFFERENTIAL
Eosinophils Absolute: 0.1
Eosinophils Relative: 1
Lymphocytes Relative: 6 — ABNORMAL LOW
Lymphs Abs: 0.9
Monocytes Absolute: 0.8 — ABNORMAL HIGH
Monocytes Relative: 5

## 2011-03-03 LAB — IRON AND TIBC
Iron: 64
Saturation Ratios: 29
TIBC: 218

## 2011-03-03 LAB — CREATININE, URINE, 24 HOUR: Creatinine, Urine: 43.3

## 2011-03-03 LAB — URINE MICROSCOPIC-ADD ON

## 2011-03-03 LAB — PROTIME-INR: Prothrombin Time: 13

## 2011-03-03 LAB — ROCKY MTN SPOTTED FVR AB, IGG-BLOOD: RMSF IgG: 1.23 {ISR} — ABNORMAL HIGH

## 2011-03-03 LAB — URINE CULTURE: Culture: NO GROWTH

## 2011-03-03 LAB — ANTI-NEUTROPHIL ANTIBODY: Cytoplasmic Neutrophilic Ab: 1:80 {titer} — ABNORMAL HIGH

## 2011-03-03 LAB — PROTEIN / CREATININE RATIO, URINE: Total Protein, Urine: 12

## 2011-03-03 LAB — PROTEIN, URINE, 24 HOUR
Collection Interval-UPROT: 24
Protein, 24H Urine: 690 — ABNORMAL HIGH

## 2011-03-03 LAB — ROCKY MTN SPOTTED FVR AB, IGM-BLOOD: RMSF IgM: 0.03

## 2011-03-03 LAB — HEMOGLOBIN AND HEMATOCRIT, BLOOD
HCT: 26.6 — ABNORMAL LOW
HCT: 28.4 — ABNORMAL LOW

## 2011-03-03 LAB — LACTATE DEHYDROGENASE: LDH: 164

## 2011-03-04 LAB — BASIC METABOLIC PANEL
CO2: 21
CO2: 23
Calcium: 8.4
Chloride: 105
Chloride: 106
Creatinine, Ser: 2.31 — ABNORMAL HIGH
GFR calc Af Amer: 34 — ABNORMAL LOW
Glucose, Bld: 135 — ABNORMAL HIGH
Potassium: 2.9 — ABNORMAL LOW
Sodium: 134 — ABNORMAL LOW

## 2011-05-26 ENCOUNTER — Encounter: Payer: Self-pay | Admitting: Cardiovascular Disease

## 2011-05-26 ENCOUNTER — Ambulatory Visit (INDEPENDENT_AMBULATORY_CARE_PROVIDER_SITE_OTHER): Payer: Medicare Other | Admitting: Cardiovascular Disease

## 2011-05-26 VITALS — BP 130/80 | HR 76 | Resp 12

## 2011-05-26 DIAGNOSIS — E7800 Pure hypercholesterolemia, unspecified: Secondary | ICD-10-CM

## 2011-05-26 DIAGNOSIS — I10 Essential (primary) hypertension: Secondary | ICD-10-CM

## 2011-05-26 DIAGNOSIS — R0602 Shortness of breath: Secondary | ICD-10-CM | POA: Diagnosis not present

## 2011-05-26 DIAGNOSIS — D649 Anemia, unspecified: Secondary | ICD-10-CM | POA: Diagnosis not present

## 2011-05-26 DIAGNOSIS — I2510 Atherosclerotic heart disease of native coronary artery without angina pectoris: Secondary | ICD-10-CM

## 2011-05-26 DIAGNOSIS — J8410 Pulmonary fibrosis, unspecified: Secondary | ICD-10-CM

## 2011-05-26 MED ORDER — NITROGLYCERIN 0.4 MG SL SUBL: 0.4000 mg | SUBLINGUAL_TABLET | SUBLINGUAL | Status: DC | PRN

## 2011-05-26 NOTE — Patient Instructions (Signed)
Your physician wants you to follow-up in: [place] will receive a reminder letter in the mail two months in advance. If you don't receive a letter, please call our office to schedule the follow-up appointment. Your physician recommends that you continue on your current medications as directed. Please refer to the Current Medication list given to you today.  6 MIN WALK TEST

## 2011-05-26 NOTE — Assessment & Plan Note (Signed)
Cholesterol is at goal.  Continue current dose of statin and diet Rx.  No myalgias or side effects.  F/U  LFT's in 6 months. Lab Results  Component Value Date   LDLCALC 78 07/20/2007

## 2011-05-26 NOTE — Assessment & Plan Note (Signed)
Well controlled.  Continue current medications and low sodium Dash type diet.    

## 2011-05-26 NOTE — Assessment & Plan Note (Signed)
Stable with no angina and good activity level.  Continue medical Rx  

## 2011-05-26 NOTE — Assessment & Plan Note (Signed)
Dyspnea made worse by anemia and obesity.  Discussed low carb diet.  Needs 6 minute walk test.  Refer back to pulmonary if desats occur

## 2011-05-26 NOTE — Progress Notes (Signed)
Luke Mcbride is seen today in followup for his coronary artery disease, hypertension, and dyspnea. He is S/P coronary bypass surgery in 2002. His last Myoview study was done 3/24/ 2009. He had a high lateral infarct but no ischemia and an EF of 47%. He is back on baby aspirin. He has had significant vasculitis involving his lugns and kidneys. He is seing Dr. Azzie Mcbride and Dr. Joya Mcbride. His dyspnea seems to be improving he is coughing less. He had significant groundglass appearance to his lungs on CT scan. Kidney function appears to have stabilized with a creatinine in the mid-2 range. He tells me it is down to 1.6 range now. He was on Cytoxan for a while and is now on Imuran Feels much better on Imuran. Not having any significant chest pain PND orthopnea he has trace lower extremity edema. No diaphoresis or syncope. Has had some visual problems with his eyes and is now off Diamox. 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 He sees Dr Luke Mcbride at [place].  He has some right shoulder pain that is probably muscular. He wanted to try being off Crestor for a while to see if it helps and I told him that was fine.   ROS: Denies fever, malais, weight loss, blurry vision, decreased visual acuity, cough, sputum, SOB, hemoptysis, pleuritic pain, palpitaitons, heartburn, abdominal pain, melena, lower extremity edema, claudication, or rash.  All other systems reviewed and negative  General: Affect appropriate Healthy:  appears stated age 76: normal Neck supple with no adenopathy JVP normal no bruits no thyromegaly Lungs clear with no wheezing and good diaphragmatic motion Heart:  S1/S2 no murmur,rub, gallop or click PMI normal Abdomen: benighn, BS positve, no tenderness, no AAA obese with ventral hernia no bruit.  No HSM or HJR Distal pulses intact with no bruits No edema Neuro non-focal Skin warm and dry No muscular weakness   Current Outpatient Prescriptions  Medication Sig Dispense Refill  .  azaTHIOprine (IMURAN) 50 MG tablet 2 in the morning, 1 in the afternoon       . CRESTOR 10 MG tablet TAKE ONE TABLET BY MOUTH DAILY.  30 tablet  7  . finasteride (PROSCAR) 5 MG tablet Take 5 mg by mouth daily.      [name] levothyroxine (SYNTHROID, LEVOTHROID) 100 MCG tablet Take 100 mcg by mouth daily.        . Multiple Vitamin (MULTIVITAMIN) tablet Take 1 tablet by mouth daily.        . nitroGLYCERIN (NITROSTAT) 0.4 MG SL tablet Place 1 tablet (0.4 mg total) under the tongue every 5 (five) minutes as needed. May repeat x3  25 tablet  11  . omeprazole-sodium bicarbonate (ZEGERID) 40-1100 MG per capsule Take 1 capsule by mouth daily as needed.        . Tamsulosin HCl (FLOMAX) 0.4 MG CAPS Take 0.4 mg by mouth daily.        [name] telmisartan (MICARDIS) 20 MG tablet Take 20 mg by mouth daily.       [name] DISCONTD: nitroGLYCERIN (NITROSTAT) 0.4 MG SL tablet Place 0.4 mg under the tongue every 5 (five) minutes as needed. May repeat x3         Allergies  Review of patient's allergies indicates no known allergies.  Electrocardiogram:  NSR rate 76  Nonspecific ST/T wave changes  Assessment and Plan

## 2011-05-26 NOTE — Assessment & Plan Note (Signed)
Improved.  F/U Dr Azzie Roup  Has not had erythropoitin since July

## 2011-06-04 DIAGNOSIS — N2581 Secondary hyperparathyroidism of renal origin: Secondary | ICD-10-CM | POA: Diagnosis not present

## 2011-06-04 DIAGNOSIS — D509 Iron deficiency anemia, unspecified: Secondary | ICD-10-CM | POA: Diagnosis not present

## 2011-06-04 DIAGNOSIS — I10 Essential (primary) hypertension: Secondary | ICD-10-CM | POA: Diagnosis not present

## 2011-06-08 ENCOUNTER — Ambulatory Visit: Payer: Medicare Other

## 2011-06-08 ENCOUNTER — Ambulatory Visit (INDEPENDENT_AMBULATORY_CARE_PROVIDER_SITE_OTHER): Payer: Medicare Other | Admitting: Internal Medicine

## 2011-06-08 DIAGNOSIS — I10 Essential (primary) hypertension: Secondary | ICD-10-CM | POA: Diagnosis not present

## 2011-06-08 DIAGNOSIS — J849 Interstitial pulmonary disease, unspecified: Secondary | ICD-10-CM

## 2011-06-08 DIAGNOSIS — J8410 Pulmonary fibrosis, unspecified: Secondary | ICD-10-CM

## 2011-06-08 DIAGNOSIS — D509 Iron deficiency anemia, unspecified: Secondary | ICD-10-CM | POA: Diagnosis not present

## 2011-07-05 ENCOUNTER — Telehealth: Payer: Self-pay | Admitting: *Deleted

## 2011-07-05 NOTE — Telephone Encounter (Signed)
PT AWARE OF 6 MIN WALK TEST RESULTS./CY

## 2011-07-06 DIAGNOSIS — D509 Iron deficiency anemia, unspecified: Secondary | ICD-10-CM | POA: Diagnosis not present

## 2011-07-06 DIAGNOSIS — I10 Essential (primary) hypertension: Secondary | ICD-10-CM | POA: Diagnosis not present

## 2011-07-06 DIAGNOSIS — D631 Anemia in chronic kidney disease: Secondary | ICD-10-CM | POA: Diagnosis not present

## 2011-08-02 DIAGNOSIS — D235 Other benign neoplasm of skin of trunk: Secondary | ICD-10-CM | POA: Diagnosis not present

## 2011-08-02 DIAGNOSIS — Z85828 Personal history of other malignant neoplasm of skin: Secondary | ICD-10-CM | POA: Diagnosis not present

## 2011-08-02 DIAGNOSIS — L570 Actinic keratosis: Secondary | ICD-10-CM | POA: Diagnosis not present

## 2011-08-03 DIAGNOSIS — R3129 Other microscopic hematuria: Secondary | ICD-10-CM | POA: Diagnosis not present

## 2011-08-03 DIAGNOSIS — D4959 Neoplasm of unspecified behavior of other genitourinary organ: Secondary | ICD-10-CM | POA: Diagnosis not present

## 2011-08-04 DIAGNOSIS — D631 Anemia in chronic kidney disease: Secondary | ICD-10-CM | POA: Diagnosis not present

## 2011-08-04 DIAGNOSIS — D509 Iron deficiency anemia, unspecified: Secondary | ICD-10-CM | POA: Diagnosis not present

## 2011-08-04 DIAGNOSIS — I10 Essential (primary) hypertension: Secondary | ICD-10-CM | POA: Diagnosis not present

## 2011-08-10 DIAGNOSIS — R82998 Other abnormal findings in urine: Secondary | ICD-10-CM | POA: Diagnosis not present

## 2011-08-12 DIAGNOSIS — N400 Enlarged prostate without lower urinary tract symptoms: Secondary | ICD-10-CM | POA: Diagnosis not present

## 2011-08-12 DIAGNOSIS — R3129 Other microscopic hematuria: Secondary | ICD-10-CM | POA: Diagnosis not present

## 2011-08-22 ENCOUNTER — Other Ambulatory Visit: Payer: Self-pay | Admitting: Cardiovascular Disease

## 2011-08-24 DIAGNOSIS — N039 Chronic nephritic syndrome with unspecified morphologic changes: Secondary | ICD-10-CM | POA: Diagnosis not present

## 2011-08-24 DIAGNOSIS — N184 Chronic kidney disease, stage 4 (severe): Secondary | ICD-10-CM | POA: Diagnosis not present

## 2011-08-24 DIAGNOSIS — N2581 Secondary hyperparathyroidism of renal origin: Secondary | ICD-10-CM | POA: Diagnosis not present

## 2011-08-24 DIAGNOSIS — I10 Essential (primary) hypertension: Secondary | ICD-10-CM | POA: Diagnosis not present

## 2011-08-24 DIAGNOSIS — D631 Anemia in chronic kidney disease: Secondary | ICD-10-CM | POA: Diagnosis not present

## 2011-09-01 DIAGNOSIS — D631 Anemia in chronic kidney disease: Secondary | ICD-10-CM | POA: Diagnosis not present

## 2011-09-01 DIAGNOSIS — D509 Iron deficiency anemia, unspecified: Secondary | ICD-10-CM | POA: Diagnosis not present

## 2011-09-01 DIAGNOSIS — I10 Essential (primary) hypertension: Secondary | ICD-10-CM | POA: Diagnosis not present

## 2011-09-27 ENCOUNTER — Other Ambulatory Visit (HOSPITAL_COMMUNITY): Payer: Self-pay | Admitting: *Deleted

## 2011-09-28 ENCOUNTER — Encounter (HOSPITAL_COMMUNITY): Payer: Medicare Other

## 2011-09-28 ENCOUNTER — Encounter (HOSPITAL_COMMUNITY)
Admission: RE | Admit: 2011-09-28 | Discharge: 2011-09-28 | Disposition: A | Discharge status: Home / Self Care | Payer: Medicare Other | Source: Ambulatory Visit | Attending: Nephrology | Admitting: Nephrology

## 2011-09-28 DIAGNOSIS — D649 Anemia, unspecified: Secondary | ICD-10-CM | POA: Insufficient documentation

## 2011-09-28 DIAGNOSIS — N183 Chronic kidney disease, stage 3 unspecified: Secondary | ICD-10-CM | POA: Diagnosis not present

## 2011-09-28 MED ORDER — EPOETIN ALFA 20000 UNIT/ML IJ SOLN: 20000.0000 [IU] | INTRAMUSCULAR | Status: DC

## 2011-10-06 DIAGNOSIS — I781 Nevus, non-neoplastic: Secondary | ICD-10-CM | POA: Diagnosis not present

## 2011-10-06 DIAGNOSIS — D235 Other benign neoplasm of skin of trunk: Secondary | ICD-10-CM | POA: Diagnosis not present

## 2011-10-06 DIAGNOSIS — C44721 Squamous cell carcinoma of skin of unspecified lower limb, including hip: Secondary | ICD-10-CM | POA: Diagnosis not present

## 2011-10-06 DIAGNOSIS — L570 Actinic keratosis: Secondary | ICD-10-CM | POA: Diagnosis not present

## 2011-10-13 ENCOUNTER — Encounter (HOSPITAL_COMMUNITY)
Admission: RE | Admit: 2011-10-13 | Discharge: 2011-10-13 | Disposition: A | Discharge status: Home / Self Care | Payer: Medicare Other | Source: Ambulatory Visit | Attending: Nephrology | Admitting: Nephrology

## 2011-10-13 MED ORDER — EPOETIN ALFA 20000 UNIT/ML IJ SOLN: 20000.0000 [IU] | INTRAMUSCULAR | Status: DC

## 2011-11-04 DIAGNOSIS — L570 Actinic keratosis: Secondary | ICD-10-CM | POA: Diagnosis not present

## 2011-11-04 DIAGNOSIS — Z85828 Personal history of other malignant neoplasm of skin: Secondary | ICD-10-CM | POA: Diagnosis not present

## 2011-11-10 ENCOUNTER — Encounter (HOSPITAL_COMMUNITY): Payer: Medicare Other

## 2011-12-02 ENCOUNTER — Encounter (HOSPITAL_COMMUNITY)
Admission: RE | Admit: 2011-12-02 | Discharge: 2011-12-02 | Disposition: A | Discharge status: Home / Self Care | Payer: Medicare Other | Source: Ambulatory Visit | Attending: Nephrology | Admitting: Nephrology

## 2011-12-02 DIAGNOSIS — N183 Chronic kidney disease, stage 3 unspecified: Secondary | ICD-10-CM | POA: Insufficient documentation

## 2011-12-02 DIAGNOSIS — D649 Anemia, unspecified: Secondary | ICD-10-CM | POA: Insufficient documentation

## 2011-12-02 LAB — IRON AND TIBC
Iron: 81 ug/dL (ref 42–135)
Saturation Ratios: 36 % (ref 20–55)
UIBC: 143 ug/dL (ref 125–400)

## 2011-12-02 LAB — CBC
Hemoglobin: 13.3 g/dL (ref 13.0–17.0)
MCHC: 33 g/dL (ref 30.0–36.0)
Platelets: 153 10*3/uL (ref 150–400)

## 2011-12-02 LAB — COMPREHENSIVE METABOLIC PANEL
ALT: 15 U/L (ref 0–53)
AST: 18 U/L (ref 0–37)
Albumin: 3.5 g/dL (ref 3.5–5.2)
Alkaline Phosphatase: 55 U/L (ref 39–117)
GFR calc Af Amer: 44 mL/min — ABNORMAL LOW (ref >90)
Glucose, Bld: 103 mg/dL — ABNORMAL HIGH (ref 70–99)
Potassium: 4.6 mEq/L (ref 3.5–5.1)
Sodium: 138 mEq/L (ref 135–145)
Total Protein: 7.1 g/dL (ref 6.0–8.3)

## 2011-12-02 LAB — FERRITIN: Ferritin: 154 ng/mL (ref 22–322)

## 2011-12-02 MED ORDER — EPOETIN ALFA 20000 UNIT/ML IJ SOLN: 20000.0000 [IU] | INTRAMUSCULAR | Status: DC

## 2011-12-16 ENCOUNTER — Encounter (HOSPITAL_COMMUNITY)
Admission: RE | Admit: 2011-12-16 | Discharge: 2011-12-16 | Disposition: A | Discharge status: Home / Self Care | Payer: Medicare Other | Source: Ambulatory Visit | Attending: Nephrology | Admitting: Nephrology

## 2011-12-16 DIAGNOSIS — N183 Chronic kidney disease, stage 3 unspecified: Secondary | ICD-10-CM | POA: Insufficient documentation

## 2011-12-16 DIAGNOSIS — D649 Anemia, unspecified: Secondary | ICD-10-CM | POA: Diagnosis not present

## 2011-12-16 LAB — URINALYSIS, MICROSCOPIC ONLY
Bilirubin Urine: NEGATIVE
Glucose, UA: NEGATIVE mg/dL
Ketones, ur: NEGATIVE mg/dL
Leukocytes, UA: NEGATIVE
Nitrite: NEGATIVE
Protein, ur: 100 mg/dL — AB
Specific Gravity, Urine: 1.016 (ref 1.005–1.030)
Urobilinogen, UA: 0.2 mg/dL (ref 0.0–1.0)
pH: 5.5 (ref 5.0–8.0)

## 2011-12-16 LAB — POCT HEMOGLOBIN-HEMACUE: Hemoglobin: 14.1 g/dL (ref 13.0–17.0)

## 2011-12-16 MED ORDER — EPOETIN ALFA 20000 UNIT/ML IJ SOLN: 20000.0000 [IU] | INTRAMUSCULAR | Status: DC

## 2011-12-30 ENCOUNTER — Encounter (HOSPITAL_COMMUNITY)
Admission: RE | Admit: 2011-12-30 | Discharge: 2011-12-30 | Disposition: A | Discharge status: Home / Self Care | Payer: Medicare Other | Source: Ambulatory Visit | Attending: Nephrology | Admitting: Nephrology

## 2011-12-30 LAB — CBC
HCT: 40.7 % (ref 39.0–52.0)
Hemoglobin: 13.5 g/dL (ref 13.0–17.0)
MCH: 34.3 pg — ABNORMAL HIGH (ref 26.0–34.0)
MCHC: 33.2 g/dL (ref 30.0–36.0)
MCV: 103.3 fL — ABNORMAL HIGH (ref 78.0–100.0)
Platelets: 150 10*3/uL (ref 150–400)
RBC: 3.94 MIL/uL — ABNORMAL LOW (ref 4.22–5.81)
RDW: 16.5 % — ABNORMAL HIGH (ref 11.5–15.5)
WBC: 4.8 10*3/uL (ref 4.0–10.5)

## 2011-12-30 LAB — URINALYSIS, MICROSCOPIC ONLY
Leukocytes, UA: NEGATIVE
Nitrite: NEGATIVE
Specific Gravity, Urine: 1.017 (ref 1.005–1.030)
pH: 5.5 (ref 5.0–8.0)

## 2011-12-30 LAB — PHOSPHORUS: Phosphorus: 2.9 mg/dL (ref 2.3–4.6)

## 2011-12-30 LAB — COMPREHENSIVE METABOLIC PANEL
ALT: 16 U/L (ref 0–53)
AST: 22 U/L (ref 0–37)
Albumin: 3.7 g/dL (ref 3.5–5.2)
Alkaline Phosphatase: 61 U/L (ref 39–117)
BUN: 39 mg/dL — ABNORMAL HIGH (ref 6–23)
CO2: 24 mEq/L (ref 19–32)
Calcium: 9.1 mg/dL (ref 8.4–10.5)
Chloride: 105 mEq/L (ref 96–112)
Creatinine, Ser: 1.81 mg/dL — ABNORMAL HIGH (ref 0.50–1.35)
GFR calc Af Amer: 41 mL/min — ABNORMAL LOW (ref >90)
GFR calc non Af Amer: 35 mL/min — ABNORMAL LOW (ref >90)
Glucose, Bld: 115 mg/dL — ABNORMAL HIGH (ref 70–99)
Potassium: 4.8 mEq/L (ref 3.5–5.1)
Sodium: 140 mEq/L (ref 135–145)
Total Bilirubin: 0.5 mg/dL (ref 0.3–1.2)
Total Protein: 7.4 g/dL (ref 6.0–8.3)

## 2011-12-30 MED ORDER — EPOETIN ALFA 20000 UNIT/ML IJ SOLN: 20000.0000 [IU] | INTRAMUSCULAR | Status: DC

## 2011-12-31 DIAGNOSIS — N2581 Secondary hyperparathyroidism of renal origin: Secondary | ICD-10-CM | POA: Diagnosis not present

## 2011-12-31 DIAGNOSIS — D509 Iron deficiency anemia, unspecified: Secondary | ICD-10-CM | POA: Diagnosis not present

## 2011-12-31 DIAGNOSIS — I10 Essential (primary) hypertension: Secondary | ICD-10-CM | POA: Diagnosis not present

## 2012-01-26 DIAGNOSIS — I10 Essential (primary) hypertension: Secondary | ICD-10-CM | POA: Diagnosis not present

## 2012-01-26 DIAGNOSIS — D509 Iron deficiency anemia, unspecified: Secondary | ICD-10-CM | POA: Diagnosis not present

## 2012-01-26 DIAGNOSIS — N2581 Secondary hyperparathyroidism of renal origin: Secondary | ICD-10-CM | POA: Diagnosis not present

## 2012-01-27 ENCOUNTER — Encounter (HOSPITAL_COMMUNITY): Payer: Medicare Other

## 2012-04-03 DIAGNOSIS — N2581 Secondary hyperparathyroidism of renal origin: Secondary | ICD-10-CM | POA: Diagnosis not present

## 2012-04-03 DIAGNOSIS — D509 Iron deficiency anemia, unspecified: Secondary | ICD-10-CM | POA: Diagnosis not present

## 2012-04-03 DIAGNOSIS — I10 Essential (primary) hypertension: Secondary | ICD-10-CM | POA: Diagnosis not present

## 2012-05-22 DIAGNOSIS — R809 Proteinuria, unspecified: Secondary | ICD-10-CM | POA: Diagnosis not present

## 2012-05-22 DIAGNOSIS — I10 Essential (primary) hypertension: Secondary | ICD-10-CM | POA: Diagnosis not present

## 2012-05-22 DIAGNOSIS — Z79899 Other long term (current) drug therapy: Secondary | ICD-10-CM | POA: Diagnosis not present

## 2012-05-22 DIAGNOSIS — E7800 Pure hypercholesterolemia, unspecified: Secondary | ICD-10-CM | POA: Diagnosis not present

## 2012-05-22 DIAGNOSIS — N2581 Secondary hyperparathyroidism of renal origin: Secondary | ICD-10-CM | POA: Diagnosis not present

## 2012-05-22 DIAGNOSIS — IMO0001 Reserved for inherently not codable concepts without codable children: Secondary | ICD-10-CM | POA: Diagnosis not present

## 2012-05-22 DIAGNOSIS — D509 Iron deficiency anemia, unspecified: Secondary | ICD-10-CM | POA: Diagnosis not present

## 2012-05-30 DIAGNOSIS — H409 Unspecified glaucoma: Secondary | ICD-10-CM | POA: Diagnosis not present

## 2012-05-30 DIAGNOSIS — H40009 Preglaucoma, unspecified, unspecified eye: Secondary | ICD-10-CM | POA: Diagnosis not present

## 2012-05-30 DIAGNOSIS — H4040X0 Glaucoma secondary to eye inflammation, unspecified eye, stage unspecified: Secondary | ICD-10-CM | POA: Diagnosis not present

## 2012-05-30 DIAGNOSIS — H3589 Other specified retinal disorders: Secondary | ICD-10-CM | POA: Diagnosis not present

## 2012-06-07 DIAGNOSIS — L820 Inflamed seborrheic keratosis: Secondary | ICD-10-CM | POA: Diagnosis not present

## 2012-06-07 DIAGNOSIS — L570 Actinic keratosis: Secondary | ICD-10-CM | POA: Diagnosis not present

## 2012-06-26 ENCOUNTER — Other Ambulatory Visit: Payer: Self-pay | Admitting: *Deleted

## 2012-06-26 MED ORDER — ROSUVASTATIN CALCIUM 10 MG PO TABS: ORAL_TABLET | ORAL | Status: DC

## 2012-07-18 DIAGNOSIS — I10 Essential (primary) hypertension: Secondary | ICD-10-CM | POA: Diagnosis not present

## 2012-07-18 DIAGNOSIS — D509 Iron deficiency anemia, unspecified: Secondary | ICD-10-CM | POA: Diagnosis not present

## 2012-07-18 DIAGNOSIS — N2581 Secondary hyperparathyroidism of renal origin: Secondary | ICD-10-CM | POA: Diagnosis not present

## 2012-07-26 ENCOUNTER — Inpatient Hospital Stay (HOSPITAL_COMMUNITY)
Admission: EM | Admit: 2012-07-26 | Discharge: 2012-07-29 | DRG: 249 | Disposition: A | Discharge status: Home / Self Care | Payer: Medicare Other | Attending: Cardiovascular Disease | Admitting: Cardiovascular Disease

## 2012-07-26 ENCOUNTER — Emergency Department (HOSPITAL_COMMUNITY): Payer: Medicare Other

## 2012-07-26 ENCOUNTER — Encounter (HOSPITAL_COMMUNITY)
Admission: EM | Disposition: A | Discharge status: Home / Self Care | Payer: Self-pay | Source: Home / Self Care | Attending: Cardiovascular Disease

## 2012-07-26 ENCOUNTER — Encounter (HOSPITAL_COMMUNITY): Payer: Self-pay | Admitting: *Deleted

## 2012-07-26 DIAGNOSIS — R072 Precordial pain: Secondary | ICD-10-CM | POA: Diagnosis not present

## 2012-07-26 DIAGNOSIS — N189 Chronic kidney disease, unspecified: Secondary | ICD-10-CM | POA: Diagnosis not present

## 2012-07-26 DIAGNOSIS — I10 Essential (primary) hypertension: Secondary | ICD-10-CM | POA: Diagnosis not present

## 2012-07-26 DIAGNOSIS — J8410 Pulmonary fibrosis, unspecified: Secondary | ICD-10-CM | POA: Diagnosis present

## 2012-07-26 DIAGNOSIS — I2510 Atherosclerotic heart disease of native coronary artery without angina pectoris: Secondary | ICD-10-CM | POA: Diagnosis present

## 2012-07-26 DIAGNOSIS — I252 Old myocardial infarction: Secondary | ICD-10-CM

## 2012-07-26 DIAGNOSIS — N183 Chronic kidney disease, stage 3 unspecified: Secondary | ICD-10-CM | POA: Diagnosis present

## 2012-07-26 DIAGNOSIS — I517 Cardiomegaly: Secondary | ICD-10-CM | POA: Diagnosis not present

## 2012-07-26 DIAGNOSIS — E039 Hypothyroidism, unspecified: Secondary | ICD-10-CM | POA: Diagnosis not present

## 2012-07-26 DIAGNOSIS — I776 Arteritis, unspecified: Secondary | ICD-10-CM | POA: Diagnosis present

## 2012-07-26 DIAGNOSIS — R079 Chest pain, unspecified: Secondary | ICD-10-CM

## 2012-07-26 DIAGNOSIS — D638 Anemia in other chronic diseases classified elsewhere: Secondary | ICD-10-CM | POA: Diagnosis present

## 2012-07-26 DIAGNOSIS — E119 Type 2 diabetes mellitus without complications: Secondary | ICD-10-CM | POA: Diagnosis present

## 2012-07-26 DIAGNOSIS — I249 Acute ischemic heart disease, unspecified: Secondary | ICD-10-CM

## 2012-07-26 DIAGNOSIS — K219 Gastro-esophageal reflux disease without esophagitis: Secondary | ICD-10-CM | POA: Diagnosis present

## 2012-07-26 DIAGNOSIS — I25810 Atherosclerosis of coronary artery bypass graft(s) without angina pectoris: Secondary | ICD-10-CM

## 2012-07-26 DIAGNOSIS — Z951 Presence of aortocoronary bypass graft: Secondary | ICD-10-CM | POA: Diagnosis not present

## 2012-07-26 DIAGNOSIS — N184 Chronic kidney disease, stage 4 (severe): Secondary | ICD-10-CM

## 2012-07-26 DIAGNOSIS — D649 Anemia, unspecified: Secondary | ICD-10-CM | POA: Diagnosis present

## 2012-07-26 DIAGNOSIS — I214 Non-ST elevation (NSTEMI) myocardial infarction: Secondary | ICD-10-CM

## 2012-07-26 DIAGNOSIS — I200 Unstable angina: Secondary | ICD-10-CM | POA: Diagnosis not present

## 2012-07-26 DIAGNOSIS — K2270 Barrett's esophagus without dysplasia: Secondary | ICD-10-CM | POA: Diagnosis present

## 2012-07-26 DIAGNOSIS — E785 Hyperlipidemia, unspecified: Secondary | ICD-10-CM | POA: Diagnosis present

## 2012-07-26 DIAGNOSIS — E7800 Pure hypercholesterolemia, unspecified: Secondary | ICD-10-CM

## 2012-07-26 DIAGNOSIS — I129 Hypertensive chronic kidney disease with stage 1 through stage 4 chronic kidney disease, or unspecified chronic kidney disease: Secondary | ICD-10-CM | POA: Diagnosis present

## 2012-07-26 DIAGNOSIS — N179 Acute kidney failure, unspecified: Secondary | ICD-10-CM | POA: Diagnosis not present

## 2012-07-26 HISTORY — DX: Unspecified glaucoma: H40.9

## 2012-07-26 HISTORY — PX: LEFT HEART CATHETERIZATION WITH CORONARY/GRAFT ANGIOGRAM: SHX5450

## 2012-07-26 HISTORY — DX: Chronic kidney disease, stage 3 unspecified: N18.30

## 2012-07-26 HISTORY — DX: Chronic kidney disease, stage 3 (moderate): N18.3

## 2012-07-26 HISTORY — PX: PERCUTANEOUS CORONARY STENT INTERVENTION (PCI-S): SHX5485

## 2012-07-26 HISTORY — DX: Hyperglycemia, unspecified: R73.9

## 2012-07-26 HISTORY — DX: Other specified interstitial pulmonary diseases: J84.89

## 2012-07-26 HISTORY — DX: Hematuria, unspecified: R31.9

## 2012-07-26 LAB — TROPONIN I
Troponin I: <0.3 ng/mL (ref <0.30)
Troponin I: 19.49 ng/mL (ref <0.30)

## 2012-07-26 LAB — PROTIME-INR
INR: 1 (ref 0.00–1.49)
Prothrombin Time: 13.1 seconds (ref 11.6–15.2)

## 2012-07-26 LAB — CBC WITH DIFFERENTIAL/PLATELET
Basophils Relative: 1 % (ref 0–1)
Eosinophils Absolute: 0.1 10*3/uL (ref 0.0–0.7)
Eosinophils Relative: 2 % (ref 0–5)
Hemoglobin: 13.4 g/dL (ref 13.0–17.0)
MCH: 33.9 pg (ref 26.0–34.0)
MCHC: 33.3 g/dL (ref 30.0–36.0)
MCV: 102 fL — ABNORMAL HIGH (ref 78.0–100.0)
Monocytes Relative: 8 % (ref 3–12)
Neutrophils Relative %: 80 % — ABNORMAL HIGH (ref 43–77)
Platelets: 141 10*3/uL — ABNORMAL LOW (ref 150–400)

## 2012-07-26 LAB — COMPREHENSIVE METABOLIC PANEL
Albumin: 3.4 g/dL — ABNORMAL LOW (ref 3.5–5.2)
Alkaline Phosphatase: 52 U/L (ref 39–117)
BUN: 29 mg/dL — ABNORMAL HIGH (ref 6–23)
Calcium: 8.8 mg/dL (ref 8.4–10.5)
GFR calc Af Amer: 40 mL/min — ABNORMAL LOW (ref >90)
Potassium: 4.5 mEq/L (ref 3.5–5.1)
Total Protein: 7.1 g/dL (ref 6.0–8.3)

## 2012-07-26 LAB — GLUCOSE, CAPILLARY: Glucose-Capillary: 83 mg/dL (ref 70–99)

## 2012-07-26 LAB — POCT ACTIVATED CLOTTING TIME: Activated Clotting Time: 383 seconds

## 2012-07-26 SURGERY — LEFT HEART CATHETERIZATION WITH CORONARY/GRAFT ANGIOGRAM
Anesthesia: LOCAL

## 2012-07-26 MED ORDER — NITROGLYCERIN 0.4 MG SL SUBL
0.4000 mg | SUBLINGUAL_TABLET | SUBLINGUAL | Status: AC | PRN
  Administered 2012-07-26 (×3): 0.4 mg via SUBLINGUAL
  Filled 2012-07-26: qty 75

## 2012-07-26 MED ORDER — BIVALIRUDIN 250 MG IV SOLR
INTRAVENOUS | Status: AC
  Filled 2012-07-26: qty 250

## 2012-07-26 MED ORDER — PANTOPRAZOLE SODIUM 40 MG PO TBEC
80.0000 mg | DELAYED_RELEASE_TABLET | Freq: Every day | ORAL | Status: DC
  Administered 2012-07-26 – 2012-07-29 (×4): 80 mg via ORAL
  Filled 2012-07-26: qty 2
  Filled 2012-07-26 (×3): qty 1
  Filled 2012-07-26: qty 2

## 2012-07-26 MED ORDER — LEVOTHYROXINE SODIUM 100 MCG PO TABS
100.0000 ug | ORAL_TABLET | Freq: Every day | ORAL | Status: DC
  Administered 2012-07-26 – 2012-07-29 (×4): 100 ug via ORAL
  Filled 2012-07-26 (×5): qty 1

## 2012-07-26 MED ORDER — ACETAMINOPHEN 325 MG PO TABS
650.0000 mg | ORAL_TABLET | ORAL | Status: DC | PRN
  Administered 2012-07-27: 650 mg via ORAL
  Filled 2012-07-26: qty 2

## 2012-07-26 MED ORDER — FAMOTIDINE IN NACL 20-0.9 MG/50ML-% IV SOLN
20.0000 mg | Freq: Two times a day (BID) | INTRAVENOUS | Status: DC
  Administered 2012-07-26 – 2012-07-27 (×3): 20 mg via INTRAVENOUS
  Filled 2012-07-26 (×3): qty 50

## 2012-07-26 MED ORDER — AZATHIOPRINE 50 MG PO TABS
50.0000 mg | ORAL_TABLET | Freq: Every day | ORAL | Status: DC
Start: 2012-07-26 — End: 2012-07-29
  Administered 2012-07-26 – 2012-07-28 (×3): 50 mg via ORAL
  Filled 2012-07-26 (×4): qty 1

## 2012-07-26 MED ORDER — HEPARIN BOLUS VIA INFUSION: 4000.0000 [IU] | Freq: Once | INTRAVENOUS | Status: DC

## 2012-07-26 MED ORDER — HEPARIN SODIUM (PORCINE) 5000 UNIT/ML IJ SOLN
5000.0000 [IU] | Freq: Three times a day (TID) | INTRAMUSCULAR | Status: DC
  Administered 2012-07-26 – 2012-07-29 (×8): 5000 [IU] via SUBCUTANEOUS
  Filled 2012-07-26 (×11): qty 1

## 2012-07-26 MED ORDER — NITROGLYCERIN 0.4 MG SL SUBL: 0.4000 mg | SUBLINGUAL_TABLET | SUBLINGUAL | Status: DC | PRN

## 2012-07-26 MED ORDER — TAMSULOSIN HCL 0.4 MG PO CAPS
0.4000 mg | ORAL_CAPSULE | Freq: Every day | ORAL | Status: DC
Start: 2012-07-26 — End: 2012-07-29
  Administered 2012-07-26 – 2012-07-29 (×4): 0.4 mg via ORAL
  Filled 2012-07-26 (×4): qty 1

## 2012-07-26 MED ORDER — IRBESARTAN 75 MG PO TABS
75.0000 mg | ORAL_TABLET | Freq: Every day | ORAL | Status: DC
  Filled 2012-07-26: qty 1

## 2012-07-26 MED ORDER — MORPHINE SULFATE 2 MG/ML IJ SOLN
2.0000 mg | Freq: Once | INTRAMUSCULAR | Status: AC
  Administered 2012-07-26: 2 mg via INTRAVENOUS

## 2012-07-26 MED ORDER — MIDAZOLAM HCL 2 MG/2ML IJ SOLN
INTRAMUSCULAR | Status: AC
  Filled 2012-07-26: qty 2

## 2012-07-26 MED ORDER — CLOPIDOGREL BISULFATE 75 MG PO TABS
75.0000 mg | ORAL_TABLET | Freq: Every day | ORAL | Status: DC
  Administered 2012-07-27 – 2012-07-29 (×3): 75 mg via ORAL
  Filled 2012-07-26 (×4): qty 1

## 2012-07-26 MED ORDER — ONDANSETRON HCL 4 MG/2ML IJ SOLN: 4.0000 mg | Freq: Four times a day (QID) | INTRAMUSCULAR | Status: DC | PRN

## 2012-07-26 MED ORDER — ASPIRIN EC 81 MG PO TBEC: 81.0000 mg | DELAYED_RELEASE_TABLET | Freq: Every day | ORAL | Status: DC

## 2012-07-26 MED ORDER — SODIUM CHLORIDE 0.45 % IV SOLN: INTRAVENOUS | Status: DC

## 2012-07-26 MED ORDER — ADULT MULTIVITAMIN W/MINERALS CH
1.0000 | ORAL_TABLET | Freq: Every day | ORAL | Status: DC
  Administered 2012-07-26 – 2012-07-29 (×4): 1 via ORAL
  Filled 2012-07-26 (×4): qty 1

## 2012-07-26 MED ORDER — ASPIRIN 81 MG PO CHEW
CHEWABLE_TABLET | ORAL | Status: AC
  Filled 2012-07-26: qty 4

## 2012-07-26 MED ORDER — FINASTERIDE 5 MG PO TABS
5.0000 mg | ORAL_TABLET | Freq: Every day | ORAL | Status: DC
Start: 2012-07-26 — End: 2012-07-29
  Administered 2012-07-26 – 2012-07-29 (×4): 5 mg via ORAL
  Filled 2012-07-26 (×4): qty 1

## 2012-07-26 MED ORDER — ASPIRIN EC 325 MG PO TBEC
325.0000 mg | DELAYED_RELEASE_TABLET | Freq: Every day | ORAL | Status: DC
  Administered 2012-07-28 – 2012-07-29 (×2): 325 mg via ORAL
  Filled 2012-07-26 (×2): qty 1

## 2012-07-26 MED ORDER — NITROGLYCERIN 0.4 MG SL SUBL
0.4000 mg | SUBLINGUAL_TABLET | Freq: Once | SUBLINGUAL | Status: AC
  Administered 2012-07-26: 0.4 mg via SUBLINGUAL

## 2012-07-26 MED ORDER — FENTANYL CITRATE 0.05 MG/ML IJ SOLN
INTRAMUSCULAR | Status: AC
  Filled 2012-07-26: qty 2

## 2012-07-26 MED ORDER — ATORVASTATIN CALCIUM 80 MG PO TABS
80.0000 mg | ORAL_TABLET | Freq: Every day | ORAL | Status: DC
  Administered 2012-07-26: 80 mg via ORAL
  Filled 2012-07-26 (×3): qty 1

## 2012-07-26 MED ORDER — ONE-DAILY MULTI VITAMINS PO TABS: 1.0000 | ORAL_TABLET | Freq: Every day | ORAL | Status: DC

## 2012-07-26 MED ORDER — ATORVASTATIN CALCIUM 20 MG PO TABS: 20.0000 mg | ORAL_TABLET | Freq: Every day | ORAL | Status: DC

## 2012-07-26 MED ORDER — CLOPIDOGREL BISULFATE 300 MG PO TABS
ORAL_TABLET | ORAL | Status: AC
  Filled 2012-07-26: qty 2

## 2012-07-26 MED ORDER — AZATHIOPRINE 50 MG PO TABS
100.0000 mg | ORAL_TABLET | Freq: Every day | ORAL | Status: DC
  Administered 2012-07-26 – 2012-07-29 (×4): 100 mg via ORAL
  Filled 2012-07-26 (×4): qty 2

## 2012-07-26 MED ORDER — MORPHINE SULFATE 2 MG/ML IJ SOLN
2.0000 mg | INTRAMUSCULAR | Status: DC | PRN
  Administered 2012-07-26: 2 mg via INTRAVENOUS
  Filled 2012-07-26 (×2): qty 1

## 2012-07-26 MED ORDER — FAMOTIDINE IN NACL 20-0.9 MG/50ML-% IV SOLN
INTRAVENOUS | Status: AC
  Filled 2012-07-26: qty 50

## 2012-07-26 MED ORDER — HEPARIN (PORCINE) IN NACL 100-0.45 UNIT/ML-% IJ SOLN
1200.0000 [IU]/h | INTRAMUSCULAR | Status: DC
  Filled 2012-07-26 (×2): qty 250

## 2012-07-26 MED ORDER — SODIUM CHLORIDE 0.9 % IV SOLN
INTRAVENOUS | Status: DC
  Administered 2012-07-26: 11:00:00 via INTRAVENOUS

## 2012-07-26 MED ORDER — ACETAMINOPHEN 325 MG PO TABS: 650.0000 mg | ORAL_TABLET | ORAL | Status: DC | PRN

## 2012-07-26 NOTE — ED Provider Notes (Signed)
History     CSN: [id number]  Arrival date & time 07/26/12  0424   First MD Initiated Contact with Patient 07/26/12 0450      Chief Complaint  Patient presents with  . Chest Pain    (Consider location/radiation/quality/duration/timing/severity/associated sxs/prior treatment) HPI The patient presents with chest pain that awoke him from last 3 hours ago.  Since onset the pain has been persistent, minimally improved with nitroglycerin and aspirin.  The pain is anterior, sternal, with radiation to the left arm, pressure-like.  Mild associated dyspnea.  No associated lightheadedness, nausea, vomiting, diarrhea, incontinence, unilateral leg swelling or other focal new complaints. The patient states that he had one similar, less pronounced, episode 2 days ago that resolved following nitroglycerin and aspirin use. He was in his usual state of health until tonight episode. He has a notable cardiac history.  Past Medical History  Diagnosis Date  . Hypertension   . Hypothyroidism   . ALLERGIC RHINITIS   . CAD (coronary artery disease)   . Hyperlipemia   . Bronchiolitis   . Renal insufficiency   . Iritis   . Hypercholesterolemia   . GERD (gastroesophageal reflux disease)   . Barrett's esophagus   . Local infection of skin and subcutaneous tissue   . Recurrent boils   . Abdominal pain, epigastric   . Nausea   . Epigastric pain   . Neutropenia, drug-induced   . Vasculitis   . Family history of diabetes mellitus   . Family history of depression   . Residual foreign body in soft tissue   . RLS (restless legs syndrome)   . Membranoproliferative nephritis   . DM II (diabetes mellitus, type II), controlled   . Anemia   . ILD (interstitial lung disease)     Past Surgical History  Procedure Laterality Date  . Coronary artery bypass graft  2002  . Cataract extraction      Family History  Problem Relation Age of Onset  . Depression Other   . Diabetes Other   . Prostate cancer  Other     History  Substance Use Topics  . Smoking status: Never Smoker   . Smokeless tobacco: Never Used  . Alcohol Use: No      Review of Systems  Constitutional:       Per HPI, otherwise negative  HENT:       Per HPI, otherwise negative  Respiratory:       Per HPI, otherwise negative  Cardiovascular:       Per HPI, otherwise negative  Gastrointestinal: Negative for vomiting.  Endocrine:       Negative aside from HPI  Genitourinary:       Neg aside from HPI   Musculoskeletal:       Per HPI, otherwise negative  Skin: Negative.   Neurological: Negative for syncope.    Allergies  Review of patient's allergies indicates no known allergies.  Home Medications   Current Outpatient Rx  Name  Route  Sig  Dispense  Refill  . azaTHIOprine (IMURAN) 50 MG tablet      2 in the morning, 1 in the afternoon          . finasteride (PROSCAR) 5 MG tablet   Oral   Take 5 mg by mouth daily.         [name] levothyroxine (SYNTHROID, LEVOTHROID) 100 MCG tablet   Oral   Take 100 mcg by mouth daily.           [name]  Multiple Vitamin (MULTIVITAMIN) tablet   Oral   Take 1 tablet by mouth daily.           . nitroGLYCERIN (NITROSTAT) 0.4 MG SL tablet   Sublingual   Place 1 tablet (0.4 mg total) under the tongue every 5 (five) minutes as needed. May repeat x3   25 tablet   11   . omeprazole-sodium bicarbonate (ZEGERID) 40-1100 MG per capsule   Oral   Take 1 capsule by mouth daily as needed.           . rosuvastatin (CRESTOR) 10 MG tablet      TAKE ONE TABLET BY MOUTH DAILY.   30 tablet   7   . Tamsulosin HCl (FLOMAX) 0.4 MG CAPS   Oral   Take 0.4 mg by mouth daily.           [name] telmisartan (MICARDIS) 20 MG tablet   Oral   Take 20 mg by mouth daily.            BP 161/92  Pulse 60  Temp(Src) 97.4 F (36.3 C)  Resp 20  SpO2 97%  Physical Exam  Nursing note and vitals reviewed. Constitutional: He is oriented to person, place, and time. He appears  well-developed. No distress.  HENT:  Head: Normocephalic and atraumatic.  Eyes: Conjunctivae and EOM are normal.  Cardiovascular: Normal rate and regular rhythm.   Pulmonary/Chest: Effort normal. No stridor. No respiratory distress.  Abdominal: He exhibits no distension.  Musculoskeletal: He exhibits no edema.  Neurological: He is alert and oriented to person, place, and time.  Skin: Skin is warm and dry.  Psychiatric: He has a normal mood and affect.    ED Course  Procedures (including critical care time)  Labs Reviewed  CBC WITH DIFFERENTIAL  COMPREHENSIVE METABOLIC PANEL  TROPONIN I   No results found.   No diagnosis found.   Cardiac 60 sinus rhythm normal Pulse ox 99% room air normal   7:15 AM Labs reviewed with patient and his wife.  He states his pain was gone, but has returned.  He'll receive additional nitroglycerin.  This is fourth tablet.   Date: 07/26/2012  Rate: 58  Rhythm: normal sinus rhythm  QRS Axis: normal  Intervals: normal  ST/T Wave abnormalities: nonspecific ST/T changes  Conduction Disutrbances:none  Narrative Interpretation:   Old EKG Reviewed: none available ABNORMAL   MDM  The patient presents with new chest pain.  On exam the patient is awake, alert, oriented, with unremarkable vital signs.  Given his history of multiple bypass procedures, his description of pain with radiation to the left arm, there's initial suspicion of ACS, though the absence of distress is reassuring.  The patient's initial labs do not demonstrate significant abnormalities, his ECG is largely reassuring.  Given his cardiac history I discussed the patient's case with his cardiology team for admission.        Carmin Muskrat, MD 07/26/12 [phone]

## 2012-07-26 NOTE — ED Notes (Signed)
The pt has had central chest tightness since 0200am today.  It woke him up from sleep.  Sob and he has some lt arm radiation

## 2012-07-26 NOTE — ED Notes (Signed)
The pt took nitro sl and 2 regular baby aspirin approx 0200am

## 2012-07-26 NOTE — Progress Notes (Signed)
On arrival, 78F arterial sheath sutured in RFA with saline under pressure. Distal PT palpable. Groin level 0. Sheath pulled and manual pressure held for 20 minutes with hemostasis maintained  during hold. When manual pressure was released, hematoma formed lateral and distal to the sheath site. Manual pressure reapplied for an additional 7 minutes. Firmness of hematoma reduced after additional pressure, groin soft. Site stable and level 0 upon application of pressure dressing, and distal PT palpable. Pressure dressing applied to site and patient was educated on bedrest instructions.  Garen Lah, RCIS

## 2012-07-26 NOTE — CV Procedure (Signed)
Cardiac Catheterization Procedure Note  Name: Luke Mcbride MRN: [id number] DOB: 06/01/1935  Procedure: Left Heart Cath, Selective Coronary Angiography, SVG angiography, LIMA angiography,   bare-metal stent placement to the SVG to right PDA.  Indication: Unstable angina with known history of coronary artery disease.   Medications:  Sedation:  1 mg IV Versed, 50 mcg IV Fentanyl  Contrast:  75 mL Omnipaque  Diagnostic Procedure Details: The right groin was prepped, draped, and anesthetized with 1% lidocaine. Using the modified Seldinger technique, a 5 French sheath was introduced into the right femoral artery. Standard Judkins catheters were used for selective coronary angiography . left ventricular angiography was not performed due to chronic kidney disease. An LCB catheter was used to engage the SVG to OM and SVG to diagonal. A JL 5 catheter was used to engage the left main. Catheter exchanges were performed over a wire.  The diagnostic procedure was well-tolerated without immediate complications.  Procedural Findings:  Hemodynamics: AO:  174/92   mmHg LV:  174/16    mmHg LVEDP: 27  mmHg  Coronary angiography: Coronary dominance: Right   Left Main:  Normal in size with no significant disease.  Left Anterior Descending (LAD):  Normal in size with mild diffuse 20% disease proximally. In the midsegment, there is a tubular 50% stenosis with minor irregularities distally.  1st diagonal (D1):  Small in size with no significant disease.  2nd diagonal (D2):  Normal in size with 60-70% ostial stenosis.  3rd diagonal (D3):  Small in size with no significant disease.  Circumflex (LCx):  Normal in size and nondominant. There is 70% ostial stenosis. The vessel is occluded in the midsegment.  1st obtuse marginal:  Small in size with minor irregularities.  2nd obtuse marginal:  Small in size with minor irregularities.    Right Coronary Artery: Large in size and dominant. There is  30% proximal disease. There is diffuse 30% disease throughout the vessel. There is significant ectasia noted.  posterior descending artery: Subtotally occluded proximally.   Left ventriculography: Was not performed due to significant chronic kidney disease.  PCI Procedure Note:  Following the diagnostic procedure, the decision was made to proceed with PCI. The sheath was upsized to a 6 [place]. Weight-based bivalirudin was given for anticoagulation. Once a therapeutic ACT was achieved, a 6 [place] JR 4 guide catheter was inserted but did not engage the SVG well. Thus, I used an AR-1 guide.  A run through coronary guidewire was used to cross the lesion. I then advanced a 5 mm spider filter in place to distally.   The lesion was then stented with a 4.0 x 23 mm vision bare-metal stent.  Following PCI, there was 0% residual stenosis and TIMI-3 flow. Final angiography confirmed an excellent result. There was a tiny filling defect noted within the stent but with very good distal flow.  The patient tolerated the PCI procedure well. There were no immediate procedural complications.  The patient was transferred to the post catheterization recovery area for further monitoring.  PCI Data: Vessel - SVG to PDA/PL /Segment - proximal Percent Stenosis (pre)  90% TIMI-flow 3 Stent 4.0 x 23 mm vision bare-metal stent Percent Stenosis (post) 0% TIMI-flow (post) 3  Final Conclusions:  1. Significant 2 vessel coronary artery disease. 2. Atretic LIMA to LAD. However, the native LAD has only moderate nonobstructive disease.  3. Occluded SVG to diagonal and SVG to OMs which are likely chronic. 4. Significant disease in proximal SVG to right  PDA/PL. 5. Successful bare-metal stent placement the SVG to right PDA/PL using distal protection device. 6. Moderately elevated left ventricular end-diastolic pressure.  Recommendations:  Monitor renal function. Will gently hydrate. Obtain an echocardiogram to evaluate LV  systolic function. Continue serial cardiac enzymes. The occlusion of SVG to diagonal and OM's are likely chronic. Continue dual antiplatelet therapy for a minimal of one month.  Kathlyn Sacramento MD, [facility] 07/26/2012, 1:53 PM

## 2012-07-26 NOTE — Progress Notes (Signed)
ANTICOAGULATION CONSULT NOTE - Initial Consult  Pharmacy Consult for heparin Indication: VTE prophylaxis  No Known Allergies  Patient Measurements: Wt= 109.3kg  Vital Signs: Temp: 98 F (36.7 C) (03/12 0744) Temp src: Oral (03/12 0744) BP: 147/92 mmHg (03/12 1145) Pulse Rate: 73 (03/12 1212)  Labs:  Recent Labs  07/26/12 0447 07/26/12 0448 07/26/12 1125 07/26/12 1528  HGB 13.4  --   --   --   HCT 40.3  --   --   --   PLT 141*  --   --   --   LABPROT  --   --  13.1  --   INR  --   --  1.00  --   CREATININE 1.85*  --   --   --   TROPONINI  --  <0.30  --  7.98*    The CrCl is unknown because both a height and weight (above a minimum accepted value) are required for this calculation.  Assessment: 77 yo male s/p cath to start heparin for DVT prophylaxis.  Goal of Therapy:  Monitor platelets by anticoagulation protocol: Yes   Plan:  -Will start heparin 5000 units sq q8hr tonight -CBC every 3 days -Will sign off for now but will follow CBC peripherally  Hildred Laser, Pharm D 07/26/2012 4:54 PM

## 2012-07-26 NOTE — Progress Notes (Signed)
ANTICOAGULATION CONSULT NOTE - Initial Consult  Pharmacy Consult for heparin Indication: chest pain/ACS  No Known Allergies  Patient Measurements:   Heparin Dosing Weight: 101.5kg  Vital Signs: Temp: 98 F (36.7 C) (03/12 0744) Temp src: Oral (03/12 0744) BP: 125/76 mmHg (03/12 0744) Pulse Rate: 60 (03/12 0744)  Labs:  Recent Labs  07/26/12 0447 07/26/12 0448  HGB 13.4  --   HCT 40.3  --   PLT 141*  --   CREATININE 1.85*  --   TROPONINI  --  <0.30    The CrCl is unknown because both a height and weight (above a minimum accepted value) are required for this calculation.   Medical History: Past Medical History  Diagnosis Date  . Hypertension   . Hypothyroidism   . ALLERGIC RHINITIS   . CAD (coronary artery disease)     a. NSTEMI s/p 6v CABG 2002.  [name] Hyperlipemia   . Iritis   . GERD (gastroesophageal reflux disease)   . Barrett's esophagus   . Local infection of skin and subcutaneous tissue   . Recurrent boils   . Neutropenia, drug-induced   . Vasculitis     a.  pauciimmune vasculitis with renal involvement and hx of transient hemoptysis in the past with associated BOOP, 2008 (renal bx 2008: GLN with vasculitis). b. History of treatment with 2 cycles of Cytoxan and pheresis. H/o hemoptysis and pulm hemorrhage with 2nd cycle of cytoxan.  [name] Residual foreign body in soft tissue   . Membranoproliferative nephritis   . Hyperglycemia     Patient reported while on prednisone, had to take insulin  . Anemia   . ILD (interstitial lung disease)   . BOOP (bronchiolitis obliterans with organizing pneumonia)     a. s/p R VATS 2008.  [name] Chronic renal insufficiency     a. renal bx 2008: GLN with vasculitis  . Glaucoma     a. Cannot see out of L eye.  . Hematuria     Microscopic per patient from renal vasculitis    Medications:  Infusions:  . sodium chloride 125 mL/hr at 07/26/12 1033  . heparin    . heparin      Assessment: 46 yom with significant cardiac  history presented to the ED with CP. His troponins are normal so far but planning to go to the cath lab within the hour. Pts H/H is 13.4/40.3 and plts are slightly low at 141. He is not on any anticoagulants PTA.   Goal of Therapy:  Heparin level 0.3-0.7 units/ml Monitor platelets by anticoagulation protocol: Yes   Plan:  1. Heparin bolus 4000 units IV x 1 2. Heparin drip 1200 units/hr 3. F/u post-cath - if cath delayed will check an 8 hour heparin level, daily heparin level and CBC  Luke Mcbride, Rande Lawman 07/26/2012,10:59 AM

## 2012-07-26 NOTE — Consult Note (Signed)
Reason for Consult:CKD stage III now with unstable angina and need for cardiac catheterization Referring Physician: Arling Miskin is an 77 y.o. male.  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 The discomfort was described as a tightness and was associated with discomfort in the back of both of his arms.  The CP was not relieved with NTG and came to the ED at 4am.  Pt had some new EKG changes in V6 and was set up for cardiac cath.  We were asked to help manage his CKD and follow his renal function after contrast exposure.  I discussed the case with Dr. Mare Ferrari and Mrs. Chrissie Noa and concur with the need for cardiac cath despite his increased risk for CIN.    His trend in creatinine is seen below:  Trend in Creatinine: Creatinine, Ser  Date/Time Value Range Status  07/26/2012  4:47 AM 1.85* 0.50 - 1.35 mg/dL Final  12/30/2011  9:34 AM 1.81* 0.50 - 1.35 mg/dL Final  12/02/2011  8:39 AM 1.71* 0.50 - 1.35 mg/dL Final  12/07/2007 10:08 AM 2.2* 0.4-1.5 mg/dL Final  11/24/2007  6:55 AM 2.15*  Final  11/23/2007  7:18 AM 2.17*  Final  11/21/2007  7:08 AM 2.05*  Final  11/20/2007  7:26 AM 2.05*  Final  11/17/2007  6:52 AM 2.21*  Final  11/16/2007  7:38 AM 2.11*  Final  11/15/2007 10:13 AM 2.33*  Final  11/15/2007  7:26 AM 2.30*  Final  11/03/2007 11:16 AM 2.4* 0.4-1.5 mg/dL Final  05/23/2007 12:00 AM 1.5  0.4-1.5 mg/dL Final  12/07/2006  1:28 PM 3.50*  Final  11/16/2006 10:01 AM 2.9* 0.4-1.5 mg/dL Final  11/13/2006  5:00 AM 2.58*  Final  11/12/2006  4:55 AM 2.68*  Final  11/11/2006  3:25 AM 3.08*  Final  11/10/2006  4:35 AM 3.19*  Final  11/09/2006  4:33 PM 3.8*  Final  11/09/2006 11:18 AM 3.6* 0.4-1.5 mg/dL Final  10/17/2006 11:56 AM 2.26*  Final  10/17/2006  4:00 AM  2.31*  Final  09/26/2006 12:19 PM 1.3  0.4-1.5 mg/dL Final  06/02/2006 12:05 PM 0.9  0.4-1.5 mg/dL Final    PMH:   Past Medical History  Diagnosis Date  . Hypertension   . Hypothyroidism   . ALLERGIC RHINITIS   . CAD (coronary artery disease)     a. NSTEMI s/p 6v CABG 2002.  [name] Hyperlipemia   . Iritis   . GERD (gastroesophageal reflux disease)   . Barrett's esophagus   . Local infection of skin and subcutaneous tissue   . Recurrent boils   . Neutropenia, drug-induced   . Vasculitis     a.  pauciimmune vasculitis with renal involvement and hx of transient hemoptysis in the past with associated BOOP, 2008 (renal bx 2008: GLN with vasculitis). b. History of treatment with 2 cycles of Cytoxan and pheresis. H/o hemoptysis and pulm hemorrhage with 2nd cycle of cytoxan.  [name] Residual foreign body in soft tissue   . Membranoproliferative nephritis   . Hyperglycemia     Patient reported while on prednisone, had to take insulin  . Anemia   . ILD (interstitial lung disease)   . BOOP (bronchiolitis obliterans with organizing pneumonia)     a. s/p  R VATS 2008.  [name] Chronic renal insufficiency     a. renal bx 2008: GLN with vasculitis  . Glaucoma     a. Cannot see out of L eye.  . Hematuria     Microscopic per patient from renal vasculitis    PSH:   Past Surgical History  Procedure Laterality Date  . Coronary artery bypass graft  2002  . Cataract extraction    . Toe amputation  2009    hammer toe    Allergies: No Known Allergies  Medications:   Prior to Admission medications   Medication Sig Start Date End Date Taking? Authorizing Provider  azaTHIOprine (IMURAN) 50 MG tablet 2 in the morning, 1 in the afternoon    Yes Historical Provider, MD  finasteride (PROSCAR) 5 MG tablet Take 5 mg by mouth daily.   Yes Historical Provider, MD  levothyroxine (SYNTHROID, LEVOTHROID) 100 MCG tablet Take 100 mcg by mouth daily.     Yes Historical Provider, MD  Multiple Vitamin (MULTIVITAMIN) tablet  Take 1 tablet by mouth daily.     Yes Historical Provider, MD  nitroGLYCERIN (NITROSTAT) 0.4 MG SL tablet Place 1 tablet (0.4 mg total) under the tongue every 5 (five) minutes as needed. May repeat x3 05/26/11  Yes [name], MD  omeprazole-sodium bicarbonate (ZEGERID) 40-1100 MG per capsule Take 1 capsule by mouth daily as needed.     Yes Historical Provider, MD  rosuvastatin (CRESTOR) 10 MG tablet TAKE ONE TABLET BY MOUTH DAILY. 06/26/12  Yes [name], MD  Tamsulosin HCl (FLOMAX) 0.4 MG CAPS Take 0.4 mg by mouth daily.     Yes Historical Provider, MD  telmisartan (MICARDIS) 20 MG tablet Take 20 mg by mouth daily.    Yes Historical Provider, MD    Inpatient medications: . [START ON 07/27/2012] aspirin EC  325 mg Oral Daily  . atorvastatin  80 mg Oral q1800  . azaTHIOprine  100 mg Oral Daily   And  . azaTHIOprine  50 mg Oral QHS  . [START ON 07/27/2012] clopidogrel  75 mg Oral Q breakfast  . famotidine  20 mg Intravenous Q12H  . finasteride  5 mg Oral Daily  . heparin subcutaneous  5,000 Units Subcutaneous Q8H  . irbesartan  75 mg Oral Daily  . levothyroxine  100 mcg Oral QAC breakfast  . multivitamin with minerals  1 tablet Oral Daily  . pantoprazole  80 mg Oral Q1200  . tamsulosin  0.4 mg Oral Daily    Discontinued Meds:   Medications Discontinued During This Encounter  Medication Reason  . heparin ADULT infusion 100 units/mL (25000 units/250 mL)   . heparin bolus via infusion 4,000 Units   . acetaminophen (TYLENOL) tablet [id number] mg Duplicate  . aspirin EC tablet 81 mg Duplicate  . atorvastatin (LIPITOR) tablet 20 mg Duplicate  . multivitamin tablet 1 tablet   . 0.9 %  sodium chloride infusion     Social History:  reports that he has never smoked. He has never used smokeless tobacco. He reports that he does not drink alcohol or use illicit drugs.  Family History:   Family History  Problem Relation Age of Onset  . Depression Other   . Diabetes Other   . Prostate cancer  Other     Pertinent items are noted in HPI. Weight change:  No intake or output data in the 24 hours ending 07/26/12 1741  General appearance: alert, cooperative and no distress Head: Normocephalic, without obvious abnormality,  atraumatic Eyes: negative Neck: no adenopathy, no carotid bruit, no JVD, supple, symmetrical, trachea midline and thyroid not enlarged, symmetric, no tenderness/mass/nodules Resp: clear to auscultation bilaterally Cardio: regular rate and rhythm, S1, S2 normal, no murmur, click, rub or gallop GI: soft, non-tender; bowel sounds normal; no masses,  no organomegaly Extremities: edema trace pretib edema  Labs: Basic Metabolic Panel:  Recent Labs Lab 07/26/12 0447  NA 139  K 4.5  CL 105  CO2 22  GLUCOSE 123*  BUN 29*  CREATININE 1.85*  ALBUMIN 3.4*  CALCIUM 8.8   Liver Function Tests:  Recent Labs Lab 07/26/12 0447  AST 24  ALT 16  ALKPHOS 52  BILITOT 0.4  PROT 7.1  ALBUMIN 3.4*   No results found for this basename: LIPASE, AMYLASE,  in the last 168 hours No results found for this basename: AMMONIA,  in the last 168 hours CBC:  Recent Labs Lab 07/26/12 0447  WBC 6.0  NEUTROABS 4.8  HGB 13.4  HCT 40.3  MCV 102.0*  PLT 141*   PT/INR: @labrcntip (inr:5) Cardiac Enzymes:  Recent Labs Lab 07/26/12 0448 07/26/12 1528  TROPONINI <0.30 7.98*   CBG:  Recent Labs Lab 07/26/12 1415  GLUCAP 83    Iron Studies: No results found for this basename: IRON, TIBC, TRANSFERRIN, FERRITIN,  in the last 168 hours  Xrays/Other Studies: Dg Chest 2 View  07/26/2012  *RADIOLOGY REPORT*  Clinical Data: Chest pain  CHEST - 2 VIEW  Comparison: 06/29/2010  Findings: Status post median sternotomy and CABG.  Heart size upper normal.  Linear opacity within the lung bases, favor scarring. Lungs otherwise predominately clear.  No pleural effusion or pneumothorax.  No acute osseous finding.  IMPRESSION: Heart size upper normal status post CABG.  No acute  process identified.   Original Report Authenticated By: Carlos Levering, M.D.      Assessment/Plan: 1.  Unstable Angina- s/p cardiac cath with PTA with bare metal stent of SVG to PDA.  Feels well.  Discussed his increased risk for CIN given underlying CKD, DM, age and amount of contrast. Also discussed with Dr. Mare Ferrari to hold ARB for several days, however it was scheduled for 2pm today.  Luckily the pt did not take it because he wanted to wait until he ate something.  I have discontinued this and will follow his Scr and UOP daily.  Avoid other nephrotoxic agents such as NSAID's/Cox-II's, and further contrast if able.  Will hold ARB for the next 48-72 hours (as long as his renal function remains stable) 2. 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  He has been treated with cytoxan twice as well as plasmapheresis with his last relapse due to pulm hemorrhage.  He is now maintained on Imuran 100qam, 50qpm and an ARB.  Will hold ARB as above and cont with imuran.   3. HTN- stable 4. DM- per primary svc 5. Hypothyroidism- cont with meds.  6. Anemia of chronic disease- has been off of EPO.  Will follow H/H.   7. Barretts' Esophagitis- cont with PPI 8. PVD- stable 9. hyeprcholesterolemia- on meds.   Emy Angevine A 07/26/2012, 5:41 PM

## 2012-07-26 NOTE — Care Management Note (Signed)
    Page 1 of 1   07/26/2012     3:31:35 PM   CARE MANAGEMENT NOTE 07/26/2012  Patient:  Luke Mcbride, Luke Mcbride   Account Number:  [id number]  Date Initiated:  07/26/2012  Documentation initiated by:  Elissa Hefty  Subjective/Objective Assessment:   adm w angina     Action/Plan:   lives w wife, pcp dr Dellis Filbert todd   Anticipated DC Date:     Anticipated DC Plan:        [place]  CM consult      Choice offered to / List presented to:             Status of service:   Medicare Important Message given?   (If response is "NO", the following Medicare IM given date fields will be blank) Date Medicare IM given:   Date Additional Medicare IM given:    Discharge Disposition:    Per UR Regulation:  Reviewed for med. necessity/level of care/duration of stay  If discussed at [place] of Stay Meetings, dates discussed:    Comments:  07/26/12 1530 debbie dowell rn,bsn

## 2012-07-26 NOTE — H&P (Signed)
History and Physical  Patient ID: Luke Mcbride MRN: [id number], DOB: 14-Aug-1935 Date of Encounter: 07/26/2012, 9:31 AM Primary Physician: Luke Man, MD Primary Cardiologist: Luke Mcbride Primary Nephrologist: Luke Mcbride  Chief Complaint: chest pain  HPI: Luke Mcbride is a 77 y/o M with history of CAD s/p NSTEMI/ CABGx6 2002 (last nuc per Luke Mcbride note: 08/08/2007: had a high lateral infarct but no ischemia, an EF of 47%), Barrett's esophagus, vasculitis including renal involvement with CKD (baseline Cr 1.8-2) and interstitial lung disease previously treated with Cytoxan, microscopic hematuria, and HTN who presented to [facility] today complaining of chest pain similar to his prior angina. On Sunday he was working outside and having to take frequent breaks due to SOB. About 30 minutes after coming inside to rest, he developed chest tightness associated with diaphoresis. He took NTG and ASA with gradual improvement in pain over 2 hours. This morning his chest pain woke him up at 2am and was associated with SOB. He took 2 SL NTG at home and 325mg  ASA with only partial relief so came to the ER where he received 4 additional NTG with relief of pain from 9/10 down to 5/10. He denies increase in pain with inspiration or palpation. No nausea, fever, chills, cough, hematemesis, melena, weight changes, recent surgery/bedrest/injury, or LEE. He does not typically exert himself but has chronic DOE with stairs. Has h/o microscopic hematuria previously thought related to renal vasculitis, but patient deferred cytoscopy in 2012. Takes aspirin infrequently, but doesn't avoid this for any particular reason. CXR: heart size upper limits normal, no acute process. VSS. Troponin neg x 1, Cr 1.85 (2.19 May 2012). EKG showing NSR with subtle ST elevation change in V6, otherwise nonspecific changes.  Past Medical History  Diagnosis Date  . Hypertension   . Hypothyroidism   . ALLERGIC RHINITIS   . CAD  (coronary artery disease)     a. NSTEMI s/p 3v CABG 2002.  [name] Hyperlipemia   . Bronchiolitis   . Renal insufficiency   . Iritis   . GERD (gastroesophageal reflux disease)   . Barrett's esophagus   . Local infection of skin and subcutaneous tissue   . Recurrent boils   . Neutropenia, drug-induced   . Vasculitis     a.  pauciimmune vasculitis with renal involvement and hx of transient hemoptysis in the past with associated BOOP, 2008 (renal bx 2008: GLN with vasculitis).  . Residual foreign body in soft tissue   . RLS (restless legs syndrome)   . Membranoproliferative nephritis   . DM II (diabetes mellitus, type II), controlled   . Anemia   . ILD (interstitial lung disease)   . BOOP (bronchiolitis obliterans with organizing pneumonia)     a. s/p R VATS 2008.  [name] Chronic renal insufficiency     a. renal bx 2008: GLN with vasculitis     Most Recent Cardiac Studies: Per Luke Mcbride office note: Last nuc 324/2009: had a high lateral infarct but no ischemia and an EF of 47%.    CABG 2002: OPERATION PERFORMED: Median sternotomy for coronary artery bypass grafting x 6 (left internal mammary artery to distal left anterior descending coronary  artery, saphenous vein graft to first diagonal branch, saphenous vein graft to  first circumflex marginal branch and sequential saphenous vein graft to second  circumflex marginal branch, saphenous vein graft to posterior descending  coronary artery and sequential saphenous vein graft to the right  posterolateral branch).    Surgical History:  Past  Surgical History  Procedure Laterality Date  . Coronary artery bypass graft  2002  . Cataract extraction       Home Meds: Prior to Admission medications   Medication Sig Start Date End Date Taking? Authorizing Mcbride  azaTHIOprine (IMURAN) 50 MG tablet 2 in the morning, 1 in the afternoon    Yes Luke Provider, MD  finasteride (PROSCAR) 5 MG tablet Take 5 mg by mouth daily.   Yes Luke  Provider, MD  levothyroxine (SYNTHROID, LEVOTHROID) 100 MCG tablet Take 100 mcg by mouth daily.     Yes Luke Provider, MD  Multiple Vitamin (MULTIVITAMIN) tablet Take 1 tablet by mouth daily.     Yes Luke Provider, MD  nitroGLYCERIN (NITROSTAT) 0.4 MG SL tablet Place 1 tablet (0.4 mg total) under the tongue every 5 (five) minutes as needed. May repeat x3 05/26/11  Yes Luke Hector, MD  omeprazole-sodium bicarbonate (ZEGERID) 40-1100 MG per capsule Take 1 capsule by mouth daily as needed.     Yes Luke Provider, MD  rosuvastatin (CRESTOR) 10 MG tablet TAKE ONE TABLET BY MOUTH DAILY. 06/26/12  Yes Luke Hector, MD  Tamsulosin HCl (FLOMAX) 0.4 MG CAPS Take 0.4 mg by mouth daily.     Yes Luke Provider, MD  telmisartan (MICARDIS) 20 MG tablet Take 20 mg by mouth daily.    Yes Luke Provider, MD    Allergies: No Known Allergies  History   Social History  . Marital Status: Married    Spouse Name: N/A    Number of Children: N/A  . Years of Education: N/A   Occupational History  . [place] History Main Topics  . Smoking status: Never Smoker   . Smokeless tobacco: Never Used  . Alcohol Use: No  . Drug Use: No  . Sexually Active: Not on file   Other Topics Concern  . Not on file   Social History Narrative   Regular exercise - yes     Family History  Problem Relation Age of Onset  . Depression Other   . Diabetes Other   . Prostate cancer Other     Review of Systems: General: negative for chills, fever, night sweats or weight changes.  Cardiovascular: see above Dermatological: negative for rash Respiratory: negative for cough or wheezing Urologic: positive hematuria history, microscopic. No frank hematuria Abdominal: negative for nausea, vomiting, diarrhea, bright red blood per rectum, melena, or hematemesis Neurologic: negative for visual changes, syncope, or dizziness. No sight L eye due to glaucoma All other systems reviewed and are  otherwise negative except as noted above.  Labs:   Lab Results  Component Value Date   WBC 6.0 07/26/2012   HGB 13.4 07/26/2012   HCT 40.3 07/26/2012   MCV 102.0* 07/26/2012   PLT 141* 07/26/2012    Recent Labs Lab 07/26/12 0447  NA 139  K 4.5  CL 105  CO2 22  BUN 29*  CREATININE 1.85*  CALCIUM 8.8  PROT 7.1  BILITOT 0.4  ALKPHOS 52  ALT 16  AST 24  GLUCOSE 123*    Recent Labs  07/26/12 0448  TROPONINI <0.30   Lab Results  Component Value Date   CHOL 130 07/20/2007   HDL 33.5* 07/20/2007   LDLCALC 78 07/20/2007   TRIG 91 07/20/2007    Radiology/Studies:  Dg Chest 2 View 07/26/2012  *RADIOLOGY REPORT*  Clinical Data: Chest pain  CHEST - 2 VIEW  Comparison: 06/29/2010  Findings: Status post median sternotomy  and CABG.  Heart size upper normal.  Linear opacity within the lung bases, favor scarring. Lungs otherwise predominately clear.  No pleural effusion or pneumothorax.  No acute osseous finding.  IMPRESSION: Heart size upper normal status post CABG.  No acute process identified.   Original Report Authenticated By: Carlos Levering, M.D.    EKG: Sinus bradycardia 58bpm nonspecific ST-T changes including 0.41mm ST elevation in V6, <0.19mm ST depression in V2-V3  Physical Exam: Blood pressure 125/76, pulse 60, temperature 98 F (36.7 C), temperature source Oral, resp. rate 12, SpO2 95.00%. General: Well developed, well nourished obese WM in no acute distress. Head: Normocephalic, atraumatic, sclera non-icteric, no xanthomas, nares are without discharge.  Neck: Negative for carotid bruits. JVD not elevated. Lungs: Clear bilaterally to auscultation without wheezes, rales, or rhonchi. Breathing is unlabored. Heart: RRR with S1 S2. No murmurs, rubs, or gallops appreciated. Abdomen: Soft, non-tender, non-distended with normoactive bowel sounds. No hepatomegaly. No rebound/guarding. No obvious abdominal masses. Msk:  Strength and tone appear normal for age. Extremities: No clubbing  or cyanosis. No edema.  Distal pedal pulses are 2+ and equal bilaterally. Neuro: Alert and oriented X 3. No focal deficit. No facial asymmetry. Moves all extremities spontaneously. Psych:  Responds to questions appropriately with a normal affect.    ASSESSMENT AND PLAN:  1. Chest pain worrisome for unstable angina with history of CAD s/p CABG 2. 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 History of microscopic hematuria, patient had previously deferred cystoscopy per 01/2011 office note 4. History of vasculitis with renal and pulmonary involvement (interstitial lung disease) 5. HTN, controlled 6. H/o anemia, currently normal  7. Hyperglycemia  Will admit, cycle enzymes, place on IV heparin, ASA, and continue statin. Hold off on BB given baseline bradycardia. Will attempt to get patient pain free and plan cardiac cath this morning. . Check UA. Will start IVF in prep for cardiac catherization. Risks and benefits of cardiac catheterization have been discussed with the patient.  These include bleeding, infection, further kidney damage including need for dialysis, stroke, heart attack, death. He has had the discussion regarding possible dialysis in the past with Dr. Arty Baumgartner and is aware this is a possibility post-catheterization. We will need to watch kidney function carefully. The patient understands these risks and is willing to proceed.  Will tentatively write admit orders for stepdown bed. Post-cath IV fluids, diet, and activity will be per post-cath orders. Hold ARB for today. Will continue other home medications.  Signed, Melina Copa PA-C 07/26/2012, 9:31 AM Seen in ER holding area.  His symptoms on Sunday and again this am are very similar to his presentation in 2002.  In addition to his precordial chest discomfort he also noted an aching sensation down the back of both biceps areas.  His physical  examination is unremarkable.  The lungs are clear.  The heart reveals no murmur gallop rub or click.  The abdomen is soft and nontender.  The extremities show good pedal pulses and no phlebitis or edema.  His electrocardiogram was reviewed and shows no definite new ischemic changes although there is a suggestion of slight ST elevation in V6.  His presentation is worrisome for acute coronary syndrome with unstable angina and we have recommended proceeding with cardiac catheterization.  His chronic renal insufficiency is noted and we will take all precautions including preoperative IV fluids and using minimal amount of dye during the case.  Close  postop evaluation of renal function will be important. Agree with assessment and plan as noted above Darlin Coco

## 2012-07-27 DIAGNOSIS — I10 Essential (primary) hypertension: Secondary | ICD-10-CM

## 2012-07-27 DIAGNOSIS — I200 Unstable angina: Secondary | ICD-10-CM

## 2012-07-27 DIAGNOSIS — E7800 Pure hypercholesterolemia, unspecified: Secondary | ICD-10-CM

## 2012-07-27 DIAGNOSIS — I517 Cardiomegaly: Secondary | ICD-10-CM

## 2012-07-27 LAB — LIPID PANEL
HDL: 38 mg/dL — ABNORMAL LOW (ref >39)
LDL Cholesterol: 116 mg/dL — ABNORMAL HIGH (ref 0–99)
Total CHOL/HDL Ratio: 4.8 RATIO
Triglycerides: 134 mg/dL (ref <150)
VLDL: 27 mg/dL (ref 0–40)

## 2012-07-27 LAB — CBC
HCT: 37.4 % — ABNORMAL LOW (ref 39.0–52.0)
MCH: 33.8 pg (ref 26.0–34.0)
MCV: 101.1 fL — ABNORMAL HIGH (ref 78.0–100.0)
Platelets: 149 10*3/uL — ABNORMAL LOW (ref 150–400)
RBC: 3.7 MIL/uL — ABNORMAL LOW (ref 4.22–5.81)
RDW: 16.1 % — ABNORMAL HIGH (ref 11.5–15.5)

## 2012-07-27 LAB — BASIC METABOLIC PANEL
BUN: 22 mg/dL (ref 6–23)
CO2: 24 mEq/L (ref 19–32)
Calcium: 8.4 mg/dL (ref 8.4–10.5)
Creatinine, Ser: 1.72 mg/dL — ABNORMAL HIGH (ref 0.50–1.35)

## 2012-07-27 LAB — TROPONIN I: Troponin I: >20 ng/mL (ref <0.30)

## 2012-07-27 MED ORDER — ATORVASTATIN CALCIUM 20 MG PO TABS
20.0000 mg | ORAL_TABLET | Freq: Every day | ORAL | Status: DC
  Administered 2012-07-27 – 2012-07-28 (×2): 20 mg via ORAL
  Filled 2012-07-27 (×3): qty 1

## 2012-07-27 MED ORDER — ASPIRIN 81 MG PO CHEW
CHEWABLE_TABLET | ORAL | Status: AC
  Administered 2012-07-27: 324 mg
  Filled 2012-07-27: qty 4

## 2012-07-27 MED ORDER — FAMOTIDINE 20 MG PO TABS
20.0000 mg | ORAL_TABLET | Freq: Two times a day (BID) | ORAL | Status: DC
  Administered 2012-07-27 – 2012-07-29 (×4): 20 mg via ORAL
  Filled 2012-07-27 (×6): qty 1

## 2012-07-27 MED FILL — Dextrose Inj 5%: INTRAVENOUS | Qty: 50 | Status: AC

## 2012-07-27 NOTE — Progress Notes (Addendum)
PROGRESS NOTE  Subjective:   Mr. Epp is a 77 yo with hx of CAD, CABG- presented with CP yesterday.  Had stent placement to SVG to distal RCA.  Feeling better this am  Objective:    Vital Signs:   Temp:  [98 F (36.7 C)-99.2 F (37.3 C)] 98.7 F (37.1 C) (03/13 0400) Pulse Rate:  [60-76] 76 (03/13 0700) Resp:  [4-23] 12 (03/13 0700) BP: (120-159)/(60-93) 143/70 mmHg (03/13 0700) SpO2:  [94 %-100 %] 97 % (03/13 0700) Weight:  [234 lb 12.6 oz (106.5 kg)-236 lb 5.3 oz (107.2 kg)] 234 lb 12.6 oz (106.5 kg) (03/13 0500)      24-hour weight change: Weight change:   Weight trends: Filed Weights   07/26/12 1900 07/27/12 0500  Weight: 236 lb 5.3 oz (107.2 kg) 234 lb 12.6 oz (106.5 kg)    Intake/Output:  03/12 0701 - 03/13 0700 In: 1288.8 [P.O.:320; I.V.:868.8; IV Piggyback:100] Out: 2200 [Urine:2200]     Physical Exam: BP 143/70  Pulse 76  Temp(Src) 98.7 F (37.1 C) (Oral)  Resp 12  Ht 6\' 1"  (1.854 m)  Wt 234 lb 12.6 oz (106.5 kg)  BMI 30.98 kg/m2  SpO2 97%  General: Vital signs reviewed and noted.   Head: Normocephalic, atraumatic.  Eyes: conjunctivae/corneas clear.  EOM's intact.   Throat: normal  Neck:  normal  Lungs:    celar  Heart:  RR, normal S1, S2  Abdomen:  Soft, non-tender, non-distended    Extremities: Groin looks good   Neurologic: A&O X3, CN II - XII are grossly intact.   Psych: Normal     Labs: BMET:  Recent Labs  07/26/12 0447 07/27/12 0249  NA 139 139  K 4.5 4.2  CL 105 105  CO2 22 24  GLUCOSE 123* 110*  BUN 29* 22  CREATININE 1.85* 1.72*  CALCIUM 8.8 8.4    Liver function tests:  Recent Labs  07/26/12 0447  AST 24  ALT 16  ALKPHOS 52  BILITOT 0.4  PROT 7.1  ALBUMIN 3.4*   No results found for this basename: LIPASE, AMYLASE,  in the last 72 hours  CBC:  Recent Labs  07/26/12 0447 07/27/12 0249  WBC 6.0 7.1  NEUTROABS 4.8  --   HGB 13.4 12.5*  HCT 40.3 37.4*  MCV 102.0* 101.1*  PLT 141* 149*     Cardiac Enzymes:  Recent Labs  07/26/12 0448 07/26/12 1528 07/26/12 2045 07/27/12 0249  TROPONINI <0.30 7.98* 19.49* >20.00*    Coagulation Studies:  Recent Labs  07/26/12 1125  LABPROT 13.1  INR 1.00    Other: No components found with this basename: POCBNP,  No results found for this basename: DDIMER,  in the last 72 hours  Recent Labs  07/26/12 1527  HGBA1C 6.0*    Recent Labs  07/27/12 0249  CHOL 181  HDL 38*  LDLCALC 116*  TRIG 134  CHOLHDL 4.8   No results found for this basename: TSH, T4TOTAL, FREET3, T3FREE, THYROIDAB,  in the last 72 hours No results found for this basename: VITAMINB12, FOLATE, FERRITIN, TIBC, IRON, RETICCTPCT,  in the last 72 hours   Other results: Tele  NSR  ECG : to be done  Medications:    Infusions:    Scheduled Medications: . aspirin EC  325 mg Oral Daily  . atorvastatin  80 mg Oral q1800  . azaTHIOprine  100 mg Oral Daily   And  . azaTHIOprine  50 mg Oral QHS  . clopidogrel  75 mg Oral Q breakfast  . famotidine  20 mg Intravenous Q12H  . finasteride  5 mg Oral Daily  . heparin subcutaneous  5,000 Units Subcutaneous Q8H  . levothyroxine  100 mcg Oral QAC breakfast  . multivitamin with minerals  1 tablet Oral Daily  . pantoprazole  80 mg Oral Q1200  . tamsulosin  0.4 mg Oral Daily    Assessment/ Plan:    1. CAD - s/p stent to SVG to distal RCA Getting an echo today since he did not have an LVG yesterday.  2. Chronic renal insufficiency:  Creatinine is actually better this am.  3. HTN - BP is ok  4. Hyperlipidemia:  On atorvastatin 80 , LDL is 116.  Will change to crestor 20 at discharge.  Crestor in nonformulary.       Disposition: to tele today  Length of Stay: 1  Thayer Headings, Brooke Bonito., MD, [facility] 07/27/2012, 7:21 AM Office [phone] Pager [phone]

## 2012-07-27 NOTE — Progress Notes (Signed)
  Echocardiogram 2D Echocardiogram has been performed.  Mcbride, Luke FRANCES 07/27/2012, 4:12 PM

## 2012-07-27 NOTE — Progress Notes (Signed)
Patient ID: Luke Mcbride, male   DOB: 07-06-1935, 77 y.o.   MRN: [id number]   [facility] Progress Note    Subjective:   Reports to be feeling better with regards to chest pain- states he is tired from being bed-bound overnight and anxious to ambulate   Objective:   BP 143/70  Pulse 76  Temp(Src) 98.7 F (37.1 C) (Oral)  Resp 12  Ht 6\' 1"  (1.854 m)  Wt 106.5 kg (234 lb 12.6 oz)  BMI 30.98 kg/m2  SpO2 97%  Intake/Output Summary (Last 24 hours) at 07/27/12 0813 Last data filed at 07/27/12 0700  Gross per 24 hour  Intake 1288.75 ml  Output   2200 ml  Net -911.25 ml   Weight change:   Physical Exam: [id number] eating breakfast [id number] RRR, normal S1 and S2  Resp:Decreased BS right base otherwise CTA- no rales [id number], obese, NT, BS normal Ext:No LE edema  Imaging: Dg Chest 2 View  07/26/2012  *RADIOLOGY REPORT*  Clinical Data: Chest pain  CHEST - 2 VIEW  Comparison: 06/29/2010  Findings: Status post median sternotomy and CABG.  Heart size upper normal.  Linear opacity within the lung bases, favor scarring. Lungs otherwise predominately clear.  No pleural effusion or pneumothorax.  No acute osseous finding.  IMPRESSION: Heart size upper normal status post CABG.  No acute process identified.   Original Report Authenticated By: Carlos Levering, M.D.     Labs: BMET  Recent Labs Lab 07/26/12 0447 07/27/12 0249  NA 139 139  K 4.5 4.2  CL 105 105  CO2 22 24  GLUCOSE 123* 110*  BUN 29* 22  CREATININE 1.85* 1.72*  CALCIUM 8.8 8.4   CBC  Recent Labs Lab 07/26/12 0447 07/27/12 0249  WBC 6.0 7.1  NEUTROABS 4.8  --   HGB 13.4 12.5*  HCT 40.3 37.4*  MCV 102.0* 101.1*  PLT 141* 149*    Medications:    . aspirin EC  325 mg Oral Daily  . atorvastatin  20 mg Oral q1800  . azaTHIOprine  100 mg Oral Daily   And  . azaTHIOprine  50 mg Oral QHS  . clopidogrel  75 mg Oral Q breakfast  . famotidine  20 mg Intravenous Q12H  . finasteride  5  mg Oral Daily  . heparin subcutaneous  5,000 Units Subcutaneous Q8H  . levothyroxine  100 mcg Oral QAC breakfast  . multivitamin with minerals  1 tablet Oral Daily  . pantoprazole  80 mg Oral Q1200  . tamsulosin  0.4 mg Oral Daily     Assessment/ Plan:   1. Unstable Angina- s/p cardiac cath with PTA with bare metal stent of SVG to PDA. Feels better today and will try to ambulate 2. 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 ARB currently on hold for avoidance of CINP/ hemodynamic injury- currently renal function remains unaffected by contrast exposure and we will continue to monitor daily for emerging needs. Plan to restart ARB tomorrow if creatinine and UOP stable. Remains on azathioprine from ANCA vasculitis maintenance therapy. 3. HTN- slightly elevated today off ARB and s/p saline- will monitor and restart ARB tomorrow if renal function stable 4. DM- per primary svc 5. Hypothyroidism- cont with levothyroxine.  6. Anemia of chronic disease- Hemoglobin acceptable- monitor off ESA.    Elmarie Shiley, MD 07/27/2012, 8:13 AM

## 2012-07-27 NOTE — Progress Notes (Signed)
Pt transferred to 2000 by NT; emotional support given; all belongings with wife & pt

## 2012-07-27 NOTE — Progress Notes (Addendum)
CARDIAC REHAB PHASE I   PRE:  Rate/Rhythm: 89 SR  BP:  Supine: 119/65  Sitting:   Standing:    SaO2: 96 RA  MODE:  Ambulation: 350 ft   POST:  Rate/Rhythm: 103 ST  BP:  Supine:   Sitting: 135/66  Standing:    SaO2:  1430-1520 Pt tolerated ambulate well without c/o of cp or SOB. VS stable. Pt to recliner after walk. Started MI education with pt and wife. Discussed MI, risk factors, stent, Plavix, heart healthy low carb diet and Outpt. CRP. Pt and wife voices understanding. Pt is considering Outpt CRP. Will follow pt tomorrow to continue education.  Rodney Langton RN 07/27/2012 3:13 PM

## 2012-07-28 LAB — RENAL FUNCTION PANEL
Albumin: 3.1 g/dL — ABNORMAL LOW (ref 3.5–5.2)
CO2: 24 mEq/L (ref 19–32)
Calcium: 8.6 mg/dL (ref 8.4–10.5)
Creatinine, Ser: 1.96 mg/dL — ABNORMAL HIGH (ref 0.50–1.35)
GFR calc non Af Amer: 32 mL/min — ABNORMAL LOW (ref >90)

## 2012-07-28 MED ORDER — METOPROLOL TARTRATE 12.5 MG HALF TABLET
12.5000 mg | ORAL_TABLET | Freq: Two times a day (BID) | ORAL | Status: DC
  Administered 2012-07-28 – 2012-07-29 (×2): 12.5 mg via ORAL
  Filled 2012-07-28 (×5): qty 1

## 2012-07-28 NOTE — Progress Notes (Signed)
PROGRESS NOTE  Subjective:   Mr. Flocco is a 77 yo with hx of CAD, CABG- presented with CP Wednesday.  Had stent placement to SVG to distal RCA.  Feeling better this am.  Has now been transferred to 2000.  Echo yesterday revealed  Normal LV function - EF 55%, trivial MR,   Objective:    Vital Signs:   Temp:  [97.8 F (36.6 C)-100.8 F (38.2 C)] 99.9 F (37.7 C) (03/14 0520) Pulse Rate:  [77-102] 86 (03/14 0520) Resp:  [15-19] 18 (03/14 0520) BP: (95-147)/(61-89) 117/72 mmHg (03/14 0520) SpO2:  [95 %-99 %] 96 % (03/14 0520) Weight:  [232 lb 9.4 oz (105.5 kg)] 232 lb 9.4 oz (105.5 kg) (03/14 0520)  Last BM Date: 07/27/12   24-hour weight change: Weight change: -3 lb 12 oz (-1.7 kg)  Weight trends: Filed Weights   07/26/12 1900 07/27/12 0500 07/28/12 0520  Weight: 236 lb 5.3 oz (107.2 kg) 234 lb 12.6 oz (106.5 kg) 232 lb 9.4 oz (105.5 kg)    Intake/Output:  03/13 0701 - 03/14 0700 In: 1080 [P.O.:1080] Out: 400 [Urine:400]     Physical Exam: BP 117/72  Pulse 86  Temp(Src) 99.9 F (37.7 C) (Oral)  Resp 18  Ht 6\' 1"  (1.854 m)  Wt 232 lb 9.4 oz (105.5 kg)  BMI 30.69 kg/m2  SpO2 96%  General: Vital signs reviewed and noted.   Head: Normocephalic, atraumatic.  Eyes: conjunctivae/corneas clear.  EOM's intact.   Throat: normal  Neck:  normal  Lungs:   clear  Heart:  RR, normal S1, S2  Abdomen:  Soft, non-tender, non-distended    Extremities: Groin looks good   Neurologic: A&O X3, CN II - XII are grossly intact.   Psych: Normal     Labs: BMET:  Recent Labs  07/26/12 0447 07/27/12 0249  NA 139 139  K 4.5 4.2  CL 105 105  CO2 22 24  GLUCOSE 123* 110*  BUN 29* 22  CREATININE 1.85* 1.72*  CALCIUM 8.8 8.4    Liver function tests:  Recent Labs  07/26/12 0447  AST 24  ALT 16  ALKPHOS 52  BILITOT 0.4  PROT 7.1  ALBUMIN 3.4*   No results found for this basename: LIPASE, AMYLASE,  in the last 72 hours  CBC:  Recent Labs  07/26/12 0447  07/27/12 0249  WBC 6.0 7.1  NEUTROABS 4.8  --   HGB 13.4 12.5*  HCT 40.3 37.4*  MCV 102.0* 101.1*  PLT 141* 149*    Cardiac Enzymes:  Recent Labs  07/26/12 0448 07/26/12 1528 07/26/12 2045 07/27/12 0249  TROPONINI <0.30 7.98* 19.49* >20.00*    Coagulation Studies:  Recent Labs  07/26/12 1125  LABPROT 13.1  INR 1.00    Other: No components found with this basename: POCBNP,  No results found for this basename: DDIMER,  in the last 72 hours  Recent Labs  07/26/12 1527  HGBA1C 6.0*    Recent Labs  07/27/12 0249  CHOL 181  HDL 38*  LDLCALC 116*  TRIG 134  CHOLHDL 4.8   No results found for this basename: TSH, T4TOTAL, FREET3, T3FREE, THYROIDAB,  in the last 72 hours No results found for this basename: VITAMINB12, FOLATE, FERRITIN, TIBC, IRON, RETICCTPCT,  in the last 72 hours   Other results: Tele  NSR  ECG : to be done  Medications:    Infusions:    Scheduled Medications: . aspirin EC  325 mg Oral Daily  . atorvastatin  20 mg Oral q1800  . azaTHIOprine  100 mg Oral Daily   And  . azaTHIOprine  50 mg Oral QHS  . clopidogrel  75 mg Oral Q breakfast  . famotidine  20 mg Oral BID  . finasteride  5 mg Oral Daily  . heparin subcutaneous  5,000 Units Subcutaneous Q8H  . levothyroxine  100 mcg Oral QAC breakfast  . multivitamin with minerals  1 tablet Oral Daily  . pantoprazole  80 mg Oral Q1200  . tamsulosin  0.4 mg Oral Daily    Assessment/ Plan:    1. CAD - s/p stent to SVG to distal RCA Doing well. No angina.  Up and around ... Will anticipate DC tomorrow.  Ambulate today.  Plavix ASA Add metoprolol 12.5 bid Not currently on ACE-inhibitor due to renal insufficiency.  He was on Micardis at home.  Can be restarted AS OP   2. Chronic renal insufficiency:  Creatinine is actually better this am.  3. HTN - BP is ok  4. Hyperlipidemia:  On atorvastatin 80 , LDL is 116.    Pt was on Crestor 10  at home.  He will need a higher dose.      Disposition: anticipate DC tomorrow.  Length of Stay: 2  Thayer Headings, Brooke Bonito., MD, [facility] 07/28/2012, 8:20 AM Office [phone] Pager [phone]

## 2012-07-28 NOTE — Progress Notes (Signed)
CARDIAC REHAB PHASE I   PRE:  Rate/Rhythm: 96SR  BP:  Supine:   Sitting: 122/70  Standing:    SaO2:   MODE:  Ambulation: 550 ft   POST:  Rate/Rhythm: 101ST  BP:  Supine:   Sitting: 140/84  Standing:    SaO2: 98%RA 1140-1210 Pt walked 550 ft with steady gait. No CP. Tolerated well. Education on exercise and NTG use completed. Discussed CRP 2. Pt does not want referral at this time.    Graylon Good, RN BSN  07/28/2012 12:06 PM

## 2012-07-28 NOTE — Progress Notes (Signed)
Patient ID: Luke Mcbride, male   DOB: 1935-06-02, 77 y.o.   MRN: [id number]   [facility] Progress Note    Subjective:   Reports no recurrence of chest pain-even with ambulation, denies any emerging shortness of breath.    Objective:   BP 117/72  Pulse 86  Temp(Src) 99.9 F (37.7 C) (Oral)  Resp 18  Ht 6\' 1"  (1.854 m)  Wt 105.5 kg (232 lb 9.4 oz)  BMI 30.69 kg/m2  SpO2 96%  Intake/Output Summary (Last 24 hours) at 07/28/12 0805 Last data filed at 07/27/12 2100  Gross per 24 hour  Intake    720 ml  Output    150 ml  Net    570 ml   Weight change: -1.7 kg (-3 lb 12 oz)  Physical Exam: Gen: comfortably resting in bed CVS: Pulse regular in rate and rhythm, heart sounds S1 and S2 normal without any murmurs rubs or gallops Resp:Clear to auscultation bilaterally, no rales/rhonchi  Abd: Soft, flat, nontender and bowel sounds are normal Ext: Trace ankle edema  Imaging: No results found.  Labs: BMET  Recent Labs Lab 07/26/12 0447 07/27/12 0249  NA 139 139  K 4.5 4.2  CL 105 105  CO2 22 24  GLUCOSE 123* 110*  BUN 29* 22  CREATININE 1.85* 1.72*  CALCIUM 8.8 8.4   CBC  Recent Labs Lab 07/26/12 0447 07/27/12 0249  WBC 6.0 7.1  NEUTROABS 4.8  --   HGB 13.4 12.5*  HCT 40.3 37.4*  MCV 102.0* 101.1*  PLT 141* 149*    Medications:    . aspirin EC  325 mg Oral Daily  . atorvastatin  20 mg Oral q1800  . azaTHIOprine  100 mg Oral Daily   And  . azaTHIOprine  50 mg Oral QHS  . clopidogrel  75 mg Oral Q breakfast  . famotidine  20 mg Oral BID  . finasteride  5 mg Oral Daily  . heparin subcutaneous  5,000 Units Subcutaneous Q8H  . levothyroxine  100 mcg Oral QAC breakfast  . multivitamin with minerals  1 tablet Oral Daily  . pantoprazole  80 mg Oral Q1200  . tamsulosin  0.4 mg Oral Daily     Assessment/ Plan:   1. Unstable Angina- s/p cardiac cath with PTA with bare metal stent of SVG to PDA. Appears to be doing better clinically, labs  awaited to decide upon disposition.  2. 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 ARB currently on hold for avoidance of CINP/ hemodynamic injury- urine output charted appears to be inaccurate as the patient states that he has urinated much more. Labs pending at this time. Remains on azathioprine from ANCA vasculitis maintenance therapy. 3. HTN- restart ARB today if renal function remains stable.  4. DM- per primary svc 5. Hypothyroidism- cont with levothyroxine.  6. Anemia of chronic disease- Hemoglobin acceptable- monitor off ESA.    Elmarie Shiley, MD 07/28/2012, 8:05 AM

## 2012-07-29 ENCOUNTER — Encounter (HOSPITAL_COMMUNITY): Payer: Self-pay | Admitting: Nurse Practitioner

## 2012-07-29 DIAGNOSIS — I214 Non-ST elevation (NSTEMI) myocardial infarction: Secondary | ICD-10-CM

## 2012-07-29 DIAGNOSIS — I776 Arteritis, unspecified: Secondary | ICD-10-CM | POA: Diagnosis present

## 2012-07-29 DIAGNOSIS — N184 Chronic kidney disease, stage 4 (severe): Secondary | ICD-10-CM

## 2012-07-29 LAB — BASIC METABOLIC PANEL
BUN: 28 mg/dL — ABNORMAL HIGH (ref 6–23)
Chloride: 101 mEq/L (ref 96–112)
Creatinine, Ser: 1.96 mg/dL — ABNORMAL HIGH (ref 0.50–1.35)
GFR calc Af Amer: 37 mL/min — ABNORMAL LOW (ref >90)
Glucose, Bld: 151 mg/dL — ABNORMAL HIGH (ref 70–99)

## 2012-07-29 MED ORDER — PANTOPRAZOLE SODIUM 40 MG PO TBEC: 40.0000 mg | DELAYED_RELEASE_TABLET | Freq: Every day | ORAL | Status: DC

## 2012-07-29 MED ORDER — ASPIRIN 81 MG PO TBEC: 81.0000 mg | DELAYED_RELEASE_TABLET | Freq: Every day | ORAL | Status: DC

## 2012-07-29 MED ORDER — METOPROLOL TARTRATE 25 MG PO TABS: 12.5000 mg | ORAL_TABLET | Freq: Two times a day (BID) | ORAL | Status: DC

## 2012-07-29 MED ORDER — ROSUVASTATIN CALCIUM 40 MG PO TABS: ORAL_TABLET | ORAL | Status: DC

## 2012-07-29 MED ORDER — CLOPIDOGREL BISULFATE 75 MG PO TABS: 75.0000 mg | ORAL_TABLET | Freq: Every day | ORAL | Status: DC

## 2012-07-29 NOTE — Progress Notes (Signed)
Pt discharged per MD order and protocol. All discharge instructions reviewed with patient and wife and all questions answered. Pt aware of all follow up appointments.

## 2012-07-29 NOTE — Progress Notes (Signed)
Patient ID: KILEN BOBLITT, male   DOB: September 02, 1935, 77 y.o.   MRN: [id number]   Patient Name: Luke Mcbride Date of Encounter: 07/29/2012    SUBJECTIVE  No chest pain or shortness of breath. Anxious to go home  CURRENT MEDS . aspirin EC  325 mg Oral Daily  . atorvastatin  20 mg Oral q1800  . azaTHIOprine  100 mg Oral Daily   And  . azaTHIOprine  50 mg Oral QHS  . clopidogrel  75 mg Oral Q breakfast  . famotidine  20 mg Oral BID  . finasteride  5 mg Oral Daily  . heparin subcutaneous  5,000 Units Subcutaneous Q8H  . levothyroxine  100 mcg Oral QAC breakfast  . metoprolol tartrate  12.5 mg Oral BID  . multivitamin with minerals  1 tablet Oral Daily  . pantoprazole  80 mg Oral Q1200  . tamsulosin  0.4 mg Oral Daily    OBJECTIVE  Filed Vitals:   07/28/12 1426 07/28/12 2003 07/29/12 0436 07/29/12 0442  BP: 123/68 120/69  126/73  Pulse: 83 96  76  Temp: 99.2 F (37.3 C) 99.4 F (37.4 C)  99 F (37.2 C)  TempSrc: Oral Oral  Oral  Resp: 18 17  19   Height:      Weight:   231 lb 7.7 oz (105 kg)   SpO2: 96% 96%  96%    Intake/Output Summary (Last 24 hours) at 07/29/12 1010 Last data filed at 07/29/12 0745  Gross per 24 hour  Intake   1080 ml  Output      0 ml  Net   1080 ml   Filed Weights   07/27/12 0500 07/28/12 0520 07/29/12 0436  Weight: 234 lb 12.6 oz (106.5 kg) 232 lb 9.4 oz (105.5 kg) 231 lb 7.7 oz (105 kg)    PHYSICAL EXAM  General: Pleasant, NAD.chronically ill-appearing, bearded Neuro: Alert and oriented X 3. Moves all extremities spontaneously. Psych: Normal affect. HEENT:  Normal  Neck: Supple without bruits or JVD. Lungs:  Resp regular and unlabored, CTA. Heart: RRR no s3, s4, or murmurs. Abdomen: Soft, non-tender, non-distended, BS + x 4.  Extremities: No clubbing, cyanosis or edema. DP/PT/Radials 2+ and equal bilaterally.  Accessory Clinical Findings  CBC  Recent Labs  07/27/12 0249  WBC 7.1  HGB 12.5*  HCT 37.4*  MCV 101.1*  PLT  123456*   Basic Metabolic Panel  Recent Labs  07/28/12 0802 07/29/12 0757  NA 136 134*  K 4.9 4.3  CL 103 101  CO2 24 24  GLUCOSE 170* 151*  BUN 22 28*  CREATININE 1.96* 1.96*  CALCIUM 8.6 8.7  MG  --  2.1  PHOS 2.5  --    Liver Function Tests  Recent Labs  07/28/12 0802  ALBUMIN 3.1*   No results found for this basename: LIPASE, AMYLASE,  in the last 72 hours Cardiac Enzymes  Recent Labs  07/26/12 1528 07/26/12 2045 07/27/12 0249  TROPONINI 7.98* 19.49* >20.00*   BNP No components found with this basename: POCBNP,  D-Dimer No results found for this basename: DDIMER,  in the last 72 hours Hemoglobin A1C  Recent Labs  07/26/12 1527  HGBA1C 6.0*   Fasting Lipid Panel  Recent Labs  07/27/12 0249  CHOL 181  HDL 38*  LDLCALC 116*  TRIG 134  CHOLHDL 4.8   Thyroid Function Tests No results found for this basename: TSH, T4TOTAL, FREET3, T3FREE, THYROIDAB,  in the last 72 hours  TELE  Normal sinus rhythm  ECG    Radiology/Studies  Dg Chest 2 View  07/26/2012  *RADIOLOGY REPORT*  Clinical Data: Chest pain  CHEST - 2 VIEW  Comparison: 06/29/2010  Findings: Status post median sternotomy and CABG.  Heart size upper normal.  Linear opacity within the lung bases, favor scarring. Lungs otherwise predominately clear.  No pleural effusion or pneumothorax.  No acute osseous finding.  IMPRESSION: Heart size upper normal status post CABG.  No acute process identified.   Original Report Authenticated By: Carlos Levering, M.D.     ASSESSMENT AND PLAN   Doing well status post intervention. Ready for discharge. Creatinine stable. Discussed with patient and his wife. Jenell Milliner MD

## 2012-07-29 NOTE — Discharge Summary (Signed)
Patient ID: Luke Mcbride,  MRN: [id number], DOB/AGE: May 06, 1936 77 y.o.  Admit date: 07/26/2012 Discharge date: 07/29/2012  Primary Care Provider: TODD,JEFFREY Mcbride Primary Cardiologist: P. [name], MD  Discharge Diagnoses Principal Problem:   NSTEMI (non-ST elevated myocardial infarction)  **S/P PCI/BMS to the VG->PDA->RPL this admission. Active Problems:   CAD   CKD (chronic kidney disease), stage III   DIABETES MELLITUS, TYPE II   Essential hypertension, benign   HYPOTHYROIDISM   HYPERLIPIDEMIA   ANEMIA   INTERSTITIAL LUNG DISEASE   Barrett's esophagus   Vasculitis  Allergies No Known Allergies  Procedures  Cardiac Catheterization and Percutaneous Coronary Intervention 07/26/2012  Hemodynamics: AO:  174/92   mmHg LV:  174/16    mmHg LVEDP: 27  mmHg  Coronary angiography: Coronary dominance: Right     Left Main:  Normal in size with no significant disease.  Left Anterior Descending (LAD):  Normal in size with mild diffuse 20% disease proximally. In the midsegment, there is a tubular 50% stenosis with minor irregularities distally.  1st diagonal (D1):  Small in size with no significant disease.  2nd diagonal (D2):  Normal in size with 60-70% ostial stenosis.  3rd diagonal (D3):  Small in size with no significant disease.  Circumflex (LCx):  Normal in size and nondominant. There is 70% ostial stenosis. The vessel is occluded in the midsegment.  1st obtuse marginal:  Small in size with minor irregularities.  2nd obtuse marginal:  Small in size with minor irregularities.   Right Coronary Artery: Large in size and dominant. There is 30% proximal disease. There is diffuse 30% disease throughout the vessel. There is significant ectasia noted.  Posterior Descending Artery: Subtotally occluded proximally.   Left ventriculography: Was not performed due to significant chronic kidney disease.  PCI Data: Vessel - SVG to PDA/PL /Segment - proximal Percent Stenosis  (pre)  90% TIMI-flow 3 Stent 4.0 x 23 mm vision bare-metal stent Percent Stenosis (post) 0% TIMI-flow (post) 3  Final Conclusions:  1. Significant 2 vessel coronary artery disease. 2. Atretic LIMA to LAD. However, the native LAD has only moderate nonobstructive disease.   3. Occluded SVG to diagonal and SVG to OMs which are likely chronic. 4. Significant disease in proximal SVG to right PDA/PL. 5. Successful bare-metal stent placement the SVG to right PDA/PL using distal protection device. 6. Moderately elevated left ventricular end-diastolic pressure. _____________  2D Echocardiogram 07/27/2012  Study Conclusions  - Left ventricle: The cavity size was normal. Luke Mcbride thickness   was increased in a pattern of mild LVH. The estimated   ejection fraction was 55%. Although no diagnostic regional   Luke Mcbride motion abnormality was identified, this possibility   cannot be completely excluded on the basis of this study.   Anterolateral Luke Mcbride poorly visualized. Doppler parameters   are consistent with abnormal left ventricular relaxation   (grade 1 diastolic dysfunction). - Aortic valve: There was no stenosis. - Mitral valve: Trivial regurgitation. - Left atrium: The atrium was moderately dilated. - Right ventricle: The cavity size was mildly dilated.   Systolic function was normal. - Right atrium: The atrium was mildly dilated. - Pulmonary arteries: No complete TR doppler jet so unable   to estimate PA systolic pressure. - Inferior vena cava: The vessel was normal in size; the   respirophasic diameter changes were in the normal range (=   50%); findings are consistent with normal central venous   pressure. Impressions:  - Normal LV size with mild LV hypertrophy. EF 55%.  Mildly   dilated RV with normal systolic function. No significant   valvular abnormalities. _____________  History of Present Illness  77 year old male with prior history of coronary artery disease status post 6 vessel  coronary artery bypass grafting in 2002, who was in his usual state of health until several days prior to admission when he began to experience exertional chest discomfort, dyspnea, and diaphoresis. On the morning of admission, he awoke with chest pain at 2 AM that was only partially relieved after taking 2 sublingual nitroglycerin tablets. He presented to the [facility] where he received 4 additional nitroglycerin tablets with some reduction in chest discomfort. ECG showed subtle ST segment elevation in V6 and initial troponin was normal. He was admitted for further evaluation.  Hospital Course  Patient ruled in for myocardial infarction eventually peaking history cone and at greater than 20. He underwent diagnostic cardiac catheterization which revealed occlusions of the vein graft to the diagonal and sequential vein graft to the obtuse marginal. These were felt to be chronic. The LIMA to the LAD was atretic however flow down the native LAD was good. The sequential vein graft to the PDA and RPL had a 90% stenosis and this was felt to be the culprit vessel. This was successfully stented using a bare-metal stent as outlined above. Due to history of stage 3-4 chronic kidney disease, left ventriculography was not performed. Post procedure, patient was monitored in the coronary intensive care unit. Nephrology was consulted and followed Luke Mcbride throughout hospitalization. His home dose of ARB therapy has been held in the setting of contrast exposure. He did experience mild rise in his creatinine from 1.85 on admission to 1.96 over the past 2 days.  Post percutaneous intervention, he had no further chest pain. 2-D echocardiogram was carried out on March 13, and showed normal LV function. He has ambulated with cardiac rehabilitation without symptoms or significant limitations. He is felt to be stable for discharge today. He will be discharged without ARB therapy at this time and will followup with nephrology in the  next 2 weeks for outpatient resumption.  Discharge Vitals Blood pressure 126/73, pulse 76, temperature 99 F (37.2 C), temperature source Oral, resp. rate 19, height 6\' 1"  (1.854 m), weight 231 lb 7.7 oz (105 kg), SpO2 96.00%.  Filed Weights   07/27/12 0500 07/28/12 0520 07/29/12 0436  Weight: 234 lb 12.6 oz (106.5 kg) 232 lb 9.4 oz (105.5 kg) 231 lb 7.7 oz (105 kg)   Labs  CBC  Recent Labs  07/27/12 0249  WBC 7.1  HGB 12.5*  HCT 37.4*  MCV 101.1*  PLT 123456*   Basic Metabolic Panel  Recent Labs  07/28/12 0802 07/29/12 0757  NA 136 134*  K 4.9 4.3  CL 103 101  CO2 24 24  GLUCOSE 170* 151*  BUN 22 28*  CREATININE 1.96* 1.96*  CALCIUM 8.6 8.7  MG  --  2.1  PHOS 2.5  --    Liver Function Tests  Recent Labs  07/28/12 0802  ALBUMIN 3.1*   Cardiac Enzymes  Recent Labs  07/26/12 1528 07/26/12 2045 07/27/12 0249  TROPONINI 7.98* 19.49* >20.00*   Hemoglobin A1C  Recent Labs  07/26/12 1527  HGBA1C 6.0*   Fasting Lipid Panel  Recent Labs  07/27/12 0249  CHOL 181  HDL 38*  LDLCALC 116*  TRIG 134  CHOLHDL 4.8   Disposition  Pt is being discharged home today in good condition.  Follow-up Plans & Appointments  Follow-up Information  Follow up with Murray Hodgkins, NP In 1 week. (Dr. Kyla Balzarine Nurse Practitioner - we will arrange.)    Contact information:   [id number] [place] [address] [phone]       Follow up with COLADONATO,JOSEPH A, MD. (1-2 weeks, to be arranged by [facility].)    Contact information:   [place] [address] [phone]     Discharge Medications    Medication List    STOP taking these medications       omeprazole-sodium bicarbonate 40-1100 MG per capsule  Commonly known as:  ZEGERID  Replaced by:  pantoprazole 40 MG tablet     telmisartan 20 MG tablet (To be resumed as outpt by nephrology)  Commonly known as:  MICARDIS        TAKE these medications       aspirin 81 MG EC tablet  Take 1 tablet (81 mg total) by mouth daily.     azaTHIOprine 50 MG tablet  Commonly known as:  IMURAN  2 in the morning, 1 in the afternoon     clopidogrel 75 MG tablet  Commonly known as:  PLAVIX  Take 1 tablet (75 mg total) by mouth daily with breakfast.     finasteride 5 MG tablet  Commonly known as:  PROSCAR  Take 5 mg by mouth daily.     FLOMAX 0.4 MG Caps  Generic drug:  tamsulosin  Take 0.4 mg by mouth daily.     levothyroxine 100 MCG tablet  Commonly known as:  SYNTHROID, LEVOTHROID  Take 100 mcg by mouth daily.     metoprolol tartrate 25 MG tablet  Commonly known as:  LOPRESSOR  Take 0.5 tablets (12.5 mg total) by mouth 2 (two) times daily.     multivitamin tablet  Take 1 tablet by mouth daily.     nitroGLYCERIN 0.4 MG SL tablet  Commonly known as:  NITROSTAT  Place 1 tablet (0.4 mg total) under the tongue every 5 (five) minutes as needed. May repeat x3     pantoprazole 40 MG tablet  Commonly known as:  PROTONIX  Take 1 tablet (40 mg total) by mouth daily.     rosuvastatin 40 MG tablet  Commonly known as:  CRESTOR  TAKE ONE TABLET BY MOUTH DAILY.      Outstanding Labs/Studies  F/u BMET as outpt.  Duration of Discharge Encounter   Greater than 30 minutes including physician time.  Signed, Murray Hodgkins NP 07/29/2012, 10:56 AM   Marijo Conception. Verl Blalock, MD, [place] Pager:  [phone]

## 2012-07-29 NOTE — Progress Notes (Signed)
Patient ID: Luke Mcbride, male   DOB: 02-Dec-1935, 77 y.o.   MRN: [id number]   [facility] Progress Note    Subjective:   Reports to be feeling well-ambulated around the hallways yesterday without any emerging chest pain. Anticipates to go home today. Urine output unfortunately inaccurately charted.    Objective:   BP 126/73  Pulse 76  Temp(Src) 99 F (37.2 C) (Oral)  Resp 19  Ht 6\' 1"  (1.854 m)  Wt 105 kg (231 lb 7.7 oz)  BMI 30.55 kg/m2  SpO2 96%  Intake/Output Summary (Last 24 hours) at 07/29/12 [id number] Last data filed at 07/29/12 0745  Gross per 24 hour  Intake   1080 ml  Output      0 ml  Net   1080 ml   Weight change: -0.5 kg (-1 lb 1.6 oz)  Physical Exam: Gen: Comfortably sitting up in his recliner CVS: Pulse regular in rate and rhythm, heart sounds S1 and S2 normal Resp: Clear to auscultation bilaterally-no rales/rhonchi Abd: Soft, obese, nontender and bowel sounds are normal Ext: No lower extremity edema  Imaging: No results found.  Labs: BMET  Recent Labs Lab 07/26/12 0447 07/27/12 0249 07/28/12 0802 07/29/12 0757  NA 139 139 136 134*  K 4.5 4.2 4.9 4.3  CL 105 105 103 101  CO2 22 24 24 24   GLUCOSE 123* 110* 170* 151*  BUN 29* 22 22 28*  CREATININE 1.85* 1.72* 1.96* 1.96*  CALCIUM 8.8 8.4 8.6 8.7  PHOS  --   --  2.5  --    CBC  Recent Labs Lab 07/26/12 0447 07/27/12 0249  WBC 6.0 7.1  NEUTROABS 4.8  --   HGB 13.4 12.5*  HCT 40.3 37.4*  MCV 102.0* 101.1*  PLT 141* 149*    Medications:    . aspirin EC  325 mg Oral Daily  . atorvastatin  20 mg Oral q1800  . azaTHIOprine  100 mg Oral Daily   And  . azaTHIOprine  50 mg Oral QHS  . clopidogrel  75 mg Oral Q breakfast  . famotidine  20 mg Oral BID  . finasteride  5 mg Oral Daily  . heparin subcutaneous  5,000 Units Subcutaneous Q8H  . levothyroxine  100 mcg Oral QAC breakfast  . metoprolol tartrate  12.5 mg Oral BID  . multivitamin with minerals  1 tablet Oral Daily   . pantoprazole  80 mg Oral Q1200  . tamsulosin  0.4 mg Oral Daily     Assessment/ Plan:   1. Unstable Angina- s/p cardiac cath with PTA with bare metal stent of SVG to PDA. Appears to be doing better clinically, no recurrence of chest pain with activity yesterday-plans noted for possible discharge today.  2. 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 ARB currently on hold for avoidance of CINP/ hemodynamic injury- urine output charted appears to be inaccurate as the patient states that he has urinated much more. Labs show subtle rise of creatinine as expected after discontinuation of intravenous fluids post-procedure. Remains on azathioprine from ANCA vasculitis maintenance therapy. 3. HTN- will need to restart ARB electively as an outpatient given recent initiation of beta blocker therapy that would likely lower his blood pressure further.  4. DM- per primary svc 5. Hypothyroidism- cont with levothyroxine.  6. Anemia of chronic disease- Hemoglobin acceptable- monitor off ESA.   Anticipated discharge today-we'll set up for outpatient renal followup in 2 weeks or so (to monitor renal function and facilitate restarting  ARB therapy)  Elmarie Shiley, MD 07/29/2012, 9:42 AM

## 2012-07-31 DIAGNOSIS — N400 Enlarged prostate without lower urinary tract symptoms: Secondary | ICD-10-CM | POA: Diagnosis not present

## 2012-08-01 ENCOUNTER — Telehealth: Payer: Self-pay | Admitting: Cardiovascular Disease

## 2012-08-01 NOTE — Telephone Encounter (Signed)
New Problem:    I scheduled the patient for a 7 day TOC appt with Richardson Dopp at 8:50am per the After Hours Fax received on 07/31/12.  Please call back.

## 2012-08-03 DIAGNOSIS — Z125 Encounter for screening for malignant neoplasm of prostate: Secondary | ICD-10-CM | POA: Diagnosis not present

## 2012-08-03 DIAGNOSIS — N400 Enlarged prostate without lower urinary tract symptoms: Secondary | ICD-10-CM | POA: Diagnosis not present

## 2012-08-03 DIAGNOSIS — R3129 Other microscopic hematuria: Secondary | ICD-10-CM | POA: Diagnosis not present

## 2012-08-07 ENCOUNTER — Encounter: Payer: Self-pay | Admitting: Physician Assistant

## 2012-08-07 ENCOUNTER — Ambulatory Visit (INDEPENDENT_AMBULATORY_CARE_PROVIDER_SITE_OTHER): Payer: Medicare Other | Admitting: Physician Assistant

## 2012-08-07 ENCOUNTER — Telehealth: Payer: Self-pay | Admitting: *Deleted

## 2012-08-07 VITALS — BP 118/78 | HR 71 | Ht 73.0 in | Wt 235.1 lb

## 2012-08-07 DIAGNOSIS — E785 Hyperlipidemia, unspecified: Secondary | ICD-10-CM | POA: Diagnosis not present

## 2012-08-07 DIAGNOSIS — N189 Chronic kidney disease, unspecified: Secondary | ICD-10-CM | POA: Diagnosis not present

## 2012-08-07 DIAGNOSIS — I10 Essential (primary) hypertension: Secondary | ICD-10-CM | POA: Diagnosis not present

## 2012-08-07 DIAGNOSIS — I2510 Atherosclerotic heart disease of native coronary artery without angina pectoris: Secondary | ICD-10-CM

## 2012-08-07 LAB — BASIC METABOLIC PANEL
BUN: 34 mg/dL — ABNORMAL HIGH (ref 6–23)
Chloride: 103 mEq/L (ref 96–112)
Creatinine, Ser: 2 mg/dL — ABNORMAL HIGH (ref 0.4–1.5)
Glucose, Bld: 107 mg/dL — ABNORMAL HIGH (ref 70–99)

## 2012-08-07 NOTE — Telephone Encounter (Signed)
Message copied by Michae Kava on Mon Aug 07, 2012  5:40 PM ------      Message from: [place], [place] T      Created: Mon Aug 07, 2012  2:36 PM       Creatinine stable.      Fax to Dr. Marval Regal.      Follow up with him as planned.      Continue with current treatment plan.      Richardson Dopp, PA-C  2:36 PM 08/07/2012 ------

## 2012-08-07 NOTE — Patient Instructions (Addendum)
LABS TODAY; BMET  PLEASE FOLLOW UP WITH DR. [name] IN 6 WEEKS WITH A FASTING LIPID AND LIVER PANEL SAME DAY OR THE DAY BEFORE  PLEASE CALL DR. COLODANOTO'S OFFICE FOR A FOLLOW UP VISIT [phone], PER DISCHARGE INSTRUCTIONS  YOU WERE TO BE SEEN [place]

## 2012-08-07 NOTE — Telephone Encounter (Signed)
pt notified about lab results and to make sure to f/u w/Dr. Larose Hires, I will fax results to Dr. Larose Hires today, pt said ok

## 2012-08-07 NOTE — Progress Notes (Signed)
[facility]. [address] [place], [place]  [address] Phone: [phone] Fax:  [phone]  Date:  08/07/2012   ID:  Luke Mcbride, DOB 1936/04/27, MRN [id number]  PCP:  [name], MD  Primary Cardiologist:  Dr. Jenkins Rouge     History of Present Illness: Luke Mcbride is a 77 y.o. male who returns for follow up after a recent admission to the hospital 3/12-3/15 with a non-STEMI.  He has a hx of CAD, s/p CABG in 2002, DM2, HTN, HL, CKD, interstitial lung disease, hypothyroidism. He presented to the hospital with progressively worsening exertional chest discomfort advancing to rest pain. Cardiac markers were abnormal. LHC 07/26/12: Proximal LAD 20%, mid LAD 50%, ostial D2 60-70%, ostial CFX 70%, mid CFX occluded, proximal RCA 30% with diffuse 30%, proximal PDA subtotally occluded, atretic LIMA-LAD, occluded SVG-DX and occluded SVG-OM (chronic), SVG-PDA/PL with severe disease. PCI: Vision BMS to the SVG-PDA/PL with distal protection.  Echo 07/27/12: Mild LVH, EF 55%, wall motion could not be assessed, grade 1 diastolic dysfunction, trivial MR, moderate LAE, mild RVE, normal RV function, mild RAE. In the setting of CKD, patient was followed by nephrology. ARB was held.  There was a mild bump in his creatinine. Plan is to follow up with nephrology to decide on whether or not he should continue on ARB therapy.  Since d/c, he is doing well.  The patient denies chest pain, significant shortness of breath, syncope, orthopnea, PND or significant pedal edema.  Feels weak but is getting better.  Has not seen his nephrologist yet.   Labs (3/14):  K 4.3, Cr 1.72=>1.96, ALT 16, LDL 116, Hgb 12.5, MCV 101, Plt 149K  Wt Readings from Last 3 Encounters:  08/07/12 235 lb 1.9 oz (106.65 kg)  07/29/12 231 lb 7.7 oz (105 kg)  07/29/12 231 lb 7.7 oz (105 kg)     Past Medical History  Diagnosis Date  . Hypertension   . Hypothyroidism   . ALLERGIC RHINITIS   . CAD (coronary artery  disease)     a. 05/2000: NSTEMI/CABG x 6: LIMA->LAD, VG->D1, VG->OM1->2, VG->PDA->RPL;  b. 07/2007 MV: high lat infarct, no ischemia, EF 47%;  c. Cath/PCI: LM nl, LAD20p, 2m, D1 nl, D2 60-70ost, D3 nl, LCX 70ost, 176m, OM1/OM2 min irregs, RCA 30 diff, PDA 99, LIMA->LAD atretic, VG->D1 100, VG->OM1->2 100, VG->PDA->RPL 90p (4.0x23 Vision BMS);  c. 07/2012 Echo: EF 55%, gr1 DD.  [name] Hyperlipemia   . Iritis   . GERD (gastroesophageal reflux disease)   . Barrett's esophagus   . Local infection of skin and subcutaneous tissue   . Recurrent boils   . Neutropenia, drug-induced   . Vasculitis     a.  pauciimmune vasculitis with renal involvement and hx of transient hemoptysis in the past with associated BOOP, 2008 (renal bx 2008: GLN with vasculitis). b. History of treatment with 2 cycles of Cytoxan and pheresis. H/o hemoptysis and pulm hemorrhage with 2nd cycle of cytoxan.  [name] Residual foreign body in soft tissue   . Membranoproliferative nephritis   . Hyperglycemia     Patient reported while on prednisone, had to take insulin  . Anemia   . ILD (interstitial lung disease)   . BOOP (bronchiolitis obliterans with organizing pneumonia)     a. s/p R VATS 2008.  . CKD (chronic kidney disease), stage III     a. renal bx 2008: GLN with vasculitis  . Glaucoma     a. Cannot see out of L  eye.  . Hematuria     Microscopic    Current Outpatient Prescriptions  Medication Sig Dispense Refill  . aspirin EC 81 MG EC tablet Take 1 tablet (81 mg total) by mouth daily.  30 tablet    . azaTHIOprine (IMURAN) 50 MG tablet 2 in the morning, 1 in the afternoon       . clopidogrel (PLAVIX) 75 MG tablet Take 1 tablet (75 mg total) by mouth daily with breakfast.  30 tablet  6  . finasteride (PROSCAR) 5 MG tablet Take 5 mg by mouth daily.      [name] levothyroxine (SYNTHROID, LEVOTHROID) 100 MCG tablet Take 100 mcg by mouth daily.        . metoprolol tartrate (LOPRESSOR) 25 MG tablet Take 0.5 tablets (12.5 mg total) by mouth 2  (two) times daily.  30 tablet  6  . Multiple Vitamin (MULTIVITAMIN) tablet Take 1 tablet by mouth daily.        . nitroGLYCERIN (NITROSTAT) 0.4 MG SL tablet Place 1 tablet (0.4 mg total) under the tongue every 5 (five) minutes as needed. May repeat x3  25 tablet  11  . oxybutynin (DITROPAN-XL) 5 MG 24 hr tablet Take 5 mg by mouth daily.      . pantoprazole (PROTONIX) 40 MG tablet Take 1 tablet (40 mg total) by mouth daily.  30 tablet  6  . rosuvastatin (CRESTOR) 40 MG tablet TAKE ONE TABLET BY MOUTH DAILY.  30 tablet  6  . Tamsulosin HCl (FLOMAX) 0.4 MG CAPS Take 0.4 mg by mouth daily.         No current facility-administered medications for this visit.    Allergies:   No Known Allergies  Social History:  The patient  reports that he has never smoked. He has never used smokeless tobacco. He reports that he does not drink alcohol or use illicit drugs.   ROS:  Please see the history of present illness.   No bleeding problems.   All other systems reviewed and negative.   PHYSICAL EXAM: VS:  BP 118/78  Pulse 71  Ht 6\' 1"  (1.854 m)  Wt 235 lb 1.9 oz (106.65 kg)  BMI 31.03 kg/m2 Well nourished, well developed, in no acute distress HEENT: normal Neck: no JVD Cardiac:  normal S1, S2; RRR; no murmur Lungs:  clear to auscultation bilaterally, no wheezing, rhonchi or rales Abd: soft, nontender, no hepatomegaly Ext: no edema Skin: warm and dry Neuro:  CNs 2-12 intact, no focal abnormalities noted  EKG:  NSR, HR 71, normal axis, inf Q waves, TWI 1, aVL, no changes     ASSESSMENT AND PLAN:  1. Coronary Artery Disease:  Doing well after recent non-STEMI treated with a bare-metal stent to the SVG-PDA.  We discussed the importance of dual antiplatelet therapy.  He is not interested in formal cardiac rehabilitation. He will pursue this on his own. We discussed this today. Continue statin. 2. Hypertension:  Controlled.  Continue current therapy.  3. Hyperlipidemia:  Statin dose increased during  admission for NSTEMI.  Check Lipids and LFTs in 6 weeks.   4. Chronic Kidney Disease:  Check BMET today.  Will make sure he has f/u with nephrology. 5. Disposition:  Follow up with Dr. Jenkins Rouge in 6 weeks.  Danton Sewer, PA-C  8:47 AM 08/07/2012

## 2012-08-17 DIAGNOSIS — I10 Essential (primary) hypertension: Secondary | ICD-10-CM | POA: Diagnosis not present

## 2012-08-17 DIAGNOSIS — D509 Iron deficiency anemia, unspecified: Secondary | ICD-10-CM | POA: Diagnosis not present

## 2012-08-17 DIAGNOSIS — R809 Proteinuria, unspecified: Secondary | ICD-10-CM | POA: Diagnosis not present

## 2012-08-17 DIAGNOSIS — I120 Hypertensive chronic kidney disease with stage 5 chronic kidney disease or end stage renal disease: Secondary | ICD-10-CM | POA: Diagnosis not present

## 2012-09-20 ENCOUNTER — Other Ambulatory Visit (INDEPENDENT_AMBULATORY_CARE_PROVIDER_SITE_OTHER): Payer: Medicare Other

## 2012-09-20 ENCOUNTER — Encounter: Payer: Self-pay | Admitting: Cardiovascular Disease

## 2012-09-20 ENCOUNTER — Ambulatory Visit (INDEPENDENT_AMBULATORY_CARE_PROVIDER_SITE_OTHER): Payer: Medicare Other | Admitting: Cardiovascular Disease

## 2012-09-20 VITALS — BP 128/86 | HR 66 | Ht 73.0 in | Wt 236.8 lb

## 2012-09-20 DIAGNOSIS — N259 Disorder resulting from impaired renal tubular function, unspecified: Secondary | ICD-10-CM

## 2012-09-20 DIAGNOSIS — I2510 Atherosclerotic heart disease of native coronary artery without angina pectoris: Secondary | ICD-10-CM | POA: Diagnosis not present

## 2012-09-20 DIAGNOSIS — E785 Hyperlipidemia, unspecified: Secondary | ICD-10-CM

## 2012-09-20 DIAGNOSIS — I10 Essential (primary) hypertension: Secondary | ICD-10-CM

## 2012-09-20 LAB — HEPATIC FUNCTION PANEL
Alkaline Phosphatase: 52 U/L (ref 39–117)
Bilirubin, Direct: 0 mg/dL (ref 0.0–0.3)
Total Bilirubin: 1 mg/dL (ref 0.3–1.2)
Total Protein: 7.2 g/dL (ref 6.0–8.3)

## 2012-09-20 LAB — LIPID PANEL
Cholesterol: 170 mg/dL (ref 0–200)
LDL Cholesterol: 105 mg/dL — ABNORMAL HIGH (ref 0–99)
Total CHOL/HDL Ratio: 5

## 2012-09-20 NOTE — Assessment & Plan Note (Signed)
Stable labs with Dr Azzie Roup in a week Back on ARB

## 2012-09-20 NOTE — Assessment & Plan Note (Signed)
Cholesterol is at goal.  Continue current dose of statin and diet Rx.  No myalgias or side effects.  F/U  LFT's in 6 months. Lab Results  Component Value Date   LDLCALC 116* 07/27/2012

## 2012-09-20 NOTE — Progress Notes (Signed)
Patient ID: Luke Mcbride, male   DOB: [birth year], 77 y.o.   MRN: [id number] Luke Mcbride is a 77 y.o. male who returns for follow up after a recent admission to the hospital 3/12-3/15 with a non-STEMI. He has a hx of CAD, s/p CABG in 2002, DM2, HTN, HL, CKD, interstitial lung disease, hypothyroidism. He presented to the hospital with progressively worsening exertional chest discomfort advancing to rest pain. Cardiac markers were abnormal. LHC 07/26/12: Proximal LAD 20%, mid LAD 50%, ostial D2 60-70%, ostial CFX 70%, mid CFX occluded, proximal RCA 30% with diffuse 30%, proximal PDA subtotally occluded, atretic LIMA-LAD, occluded SVG-DX and occluded SVG-OM (chronic), SVG-PDA/PL with severe disease. PCI: Vision BMS to the SVG-PDA/PL with distal protection. Echo 07/27/12: Mild LVH, EF 55%, wall motion could not be assessed, grade 1 diastolic dysfunction, trivial MR, moderate LAE, mild RVE, normal RV function, mild RAE. In the setting of CKD, patient was followed by nephrology. ARB was held. There was a mild bump in his creatinine. Plan is to follow up with nephrology to decide on whether or not he should continue on ARB therapy. Since d/c, he is doing well. The patient denies chest pain, significant shortness of breath, syncope, orthopnea, PND or significant pedal edema.  Back on Micardis 20 mg and seeing nephrology again in a week  ROS: Denies fever, malais, weight loss, blurry vision, decreased visual acuity, cough, sputum, SOB, hemoptysis, pleuritic pain, palpitaitons, heartburn, abdominal pain, melena, lower extremity edema, claudication, or rash.  All other systems reviewed and negative  General: Affect appropriate Healthy:  appears stated age 62: normal Neck supple with no adenopathy JVP normal no bruits no thyromegaly Lungs clear with no wheezing and good diaphragmatic motion Heart:  S1/S2 no murmur, no rub, gallop or click PMI normal Abdomen: benighn, BS positve, no tenderness, no AAA no  bruit.  No HSM or HJR Distal pulses intact with no bruits No edema Neuro non-focal Skin warm and dry No muscular weakness   Current Outpatient Prescriptions  Medication Sig Dispense Refill  . aspirin EC 81 MG EC tablet Take 1 tablet (81 mg total) by mouth daily.  30 tablet    . azaTHIOprine (IMURAN) 50 MG tablet 2 in the morning, 1 in the afternoon       . clopidogrel (PLAVIX) 75 MG tablet Take 1 tablet (75 mg total) by mouth daily with breakfast.  30 tablet  6  . finasteride (PROSCAR) 5 MG tablet Take 5 mg by mouth daily.      [name] levothyroxine (SYNTHROID, LEVOTHROID) 100 MCG tablet Take 100 mcg by mouth daily.        . metoprolol tartrate (LOPRESSOR) 25 MG tablet Take 0.5 tablets (12.5 mg total) by mouth 2 (two) times daily.  30 tablet  6  . Multiple Vitamin (MULTIVITAMIN) tablet Take 1 tablet by mouth daily.        . nitroGLYCERIN (NITROSTAT) 0.4 MG SL tablet Place 1 tablet (0.4 mg total) under the tongue every 5 (five) minutes as needed. May repeat x3  25 tablet  11  . oxybutynin (DITROPAN-XL) 5 MG 24 hr tablet Take 5 mg by mouth daily.      . pantoprazole (PROTONIX) 40 MG tablet Take 1 tablet (40 mg total) by mouth daily.  30 tablet  6  . rosuvastatin (CRESTOR) 40 MG tablet TAKE ONE TABLET BY MOUTH DAILY.  30 tablet  6  . Tamsulosin HCl (FLOMAX) 0.4 MG CAPS Take 0.4 mg by mouth daily.  No current facility-administered medications for this visit.    Allergies  Review of patient's allergies indicates no known allergies.  Electrocardiogram:  SR rate 80 old IMI  Lateral T wave changes  Assessment and Plan

## 2012-09-20 NOTE — Patient Instructions (Signed)
Your physician wants you to follow-up in:  6 MONTHS WITH DR NISHAN  You will receive a reminder letter in the mail two months in advance. If you don't receive a letter, please call our office to schedule the follow-up appointment. Your physician recommends that you continue on your current medications as directed. Please refer to the Current Medication list given to you today. 

## 2012-09-20 NOTE — Assessment & Plan Note (Signed)
Stable with no angina and good activity level.  Continue medical Rx Recent BMS to SVG RCA with resolution of angina

## 2012-09-20 NOTE — Assessment & Plan Note (Signed)
Well controlled.  Continue current medications and low sodium Dash type diet.    

## 2012-09-21 ENCOUNTER — Telehealth: Payer: Self-pay | Admitting: *Deleted

## 2012-09-21 NOTE — Telephone Encounter (Signed)
Message copied by Michae Kava on Thu Sep 21, 2012 11:24 AM ------      Message from: Emmaline Life      Created: Thu Sep 21, 2012  8:18 AM                   ----- Message -----         From: Liliane Shi, PA-C         Sent: 09/21/2012   7:20 AM           To: [facility] Triage            LFTs ok      Continue Crestor 40 mg QD.      Richardson Dopp, PA-C  7:19 AM 09/21/2012 ------

## 2012-09-21 NOTE — Telephone Encounter (Signed)
lmom lft's ok and to continue crestor 40 qd. any questions to call [phone]

## 2012-09-28 DIAGNOSIS — Z79899 Other long term (current) drug therapy: Secondary | ICD-10-CM | POA: Diagnosis not present

## 2012-09-28 DIAGNOSIS — I10 Essential (primary) hypertension: Secondary | ICD-10-CM | POA: Diagnosis not present

## 2012-09-28 DIAGNOSIS — D509 Iron deficiency anemia, unspecified: Secondary | ICD-10-CM | POA: Diagnosis not present

## 2012-09-28 DIAGNOSIS — E7800 Pure hypercholesterolemia, unspecified: Secondary | ICD-10-CM | POA: Diagnosis not present

## 2012-10-31 DIAGNOSIS — N400 Enlarged prostate without lower urinary tract symptoms: Secondary | ICD-10-CM | POA: Diagnosis not present

## 2012-10-31 DIAGNOSIS — R351 Nocturia: Secondary | ICD-10-CM | POA: Diagnosis not present

## 2012-10-31 DIAGNOSIS — R3129 Other microscopic hematuria: Secondary | ICD-10-CM | POA: Diagnosis not present

## 2012-11-13 DIAGNOSIS — H01119 Allergic dermatitis of unspecified eye, unspecified eyelid: Secondary | ICD-10-CM | POA: Diagnosis not present

## 2012-11-13 DIAGNOSIS — H04129 Dry eye syndrome of unspecified lacrimal gland: Secondary | ICD-10-CM | POA: Diagnosis not present

## 2012-11-13 DIAGNOSIS — S058X9A Other injuries of unspecified eye and orbit, initial encounter: Secondary | ICD-10-CM | POA: Diagnosis not present

## 2012-11-13 DIAGNOSIS — H1010 Acute atopic conjunctivitis, unspecified eye: Secondary | ICD-10-CM | POA: Diagnosis not present

## 2012-12-19 DIAGNOSIS — N2581 Secondary hyperparathyroidism of renal origin: Secondary | ICD-10-CM | POA: Diagnosis not present

## 2012-12-19 DIAGNOSIS — I10 Essential (primary) hypertension: Secondary | ICD-10-CM | POA: Diagnosis not present

## 2012-12-19 DIAGNOSIS — D509 Iron deficiency anemia, unspecified: Secondary | ICD-10-CM | POA: Diagnosis not present

## 2012-12-20 ENCOUNTER — Other Ambulatory Visit: Payer: Self-pay

## 2013-01-31 DIAGNOSIS — C44621 Squamous cell carcinoma of skin of unspecified upper limb, including shoulder: Secondary | ICD-10-CM | POA: Diagnosis not present

## 2013-01-31 DIAGNOSIS — L570 Actinic keratosis: Secondary | ICD-10-CM | POA: Diagnosis not present

## 2013-02-01 ENCOUNTER — Other Ambulatory Visit: Payer: Self-pay | Admitting: Nurse Practitioner

## 2013-02-01 DIAGNOSIS — I10 Essential (primary) hypertension: Secondary | ICD-10-CM | POA: Diagnosis not present

## 2013-02-01 DIAGNOSIS — D509 Iron deficiency anemia, unspecified: Secondary | ICD-10-CM | POA: Diagnosis not present

## 2013-02-01 DIAGNOSIS — N2581 Secondary hyperparathyroidism of renal origin: Secondary | ICD-10-CM | POA: Diagnosis not present

## 2013-02-01 NOTE — Addendum Note (Signed)
Addended by: Fernande Boyden on: 02/01/2013 04:14 PM   Modules accepted: Orders, Medications

## 2013-02-18 ENCOUNTER — Other Ambulatory Visit: Payer: Self-pay | Admitting: Nurse Practitioner

## 2013-02-22 DIAGNOSIS — D509 Iron deficiency anemia, unspecified: Secondary | ICD-10-CM | POA: Diagnosis not present

## 2013-02-22 DIAGNOSIS — N2581 Secondary hyperparathyroidism of renal origin: Secondary | ICD-10-CM | POA: Diagnosis not present

## 2013-02-22 DIAGNOSIS — I10 Essential (primary) hypertension: Secondary | ICD-10-CM | POA: Diagnosis not present

## 2013-02-25 ENCOUNTER — Other Ambulatory Visit: Payer: Self-pay | Admitting: Nurse Practitioner

## 2013-02-28 DIAGNOSIS — Z85828 Personal history of other malignant neoplasm of skin: Secondary | ICD-10-CM | POA: Diagnosis not present

## 2013-02-28 DIAGNOSIS — L570 Actinic keratosis: Secondary | ICD-10-CM | POA: Diagnosis not present

## 2013-03-19 ENCOUNTER — Other Ambulatory Visit: Payer: Self-pay | Admitting: Nurse Practitioner

## 2013-03-22 ENCOUNTER — Other Ambulatory Visit: Payer: Self-pay

## 2013-04-24 DIAGNOSIS — R350 Frequency of micturition: Secondary | ICD-10-CM | POA: Diagnosis not present

## 2013-04-24 DIAGNOSIS — R3129 Other microscopic hematuria: Secondary | ICD-10-CM | POA: Diagnosis not present

## 2013-04-24 DIAGNOSIS — R3915 Urgency of urination: Secondary | ICD-10-CM | POA: Diagnosis not present

## 2013-04-24 DIAGNOSIS — R351 Nocturia: Secondary | ICD-10-CM | POA: Diagnosis not present

## 2013-05-08 DIAGNOSIS — D509 Iron deficiency anemia, unspecified: Secondary | ICD-10-CM | POA: Diagnosis not present

## 2013-05-08 DIAGNOSIS — N2581 Secondary hyperparathyroidism of renal origin: Secondary | ICD-10-CM | POA: Diagnosis not present

## 2013-05-08 DIAGNOSIS — I10 Essential (primary) hypertension: Secondary | ICD-10-CM | POA: Diagnosis not present

## 2013-05-16 ENCOUNTER — Other Ambulatory Visit: Payer: Self-pay | Admitting: Cardiovascular Disease

## 2013-05-21 ENCOUNTER — Other Ambulatory Visit: Payer: Self-pay | Admitting: Cardiovascular Disease

## 2013-05-28 DIAGNOSIS — C44621 Squamous cell carcinoma of skin of unspecified upper limb, including shoulder: Secondary | ICD-10-CM | POA: Diagnosis not present

## 2013-06-15 ENCOUNTER — Other Ambulatory Visit: Payer: Self-pay | Admitting: Cardiovascular Disease

## 2013-06-20 DIAGNOSIS — D638 Anemia in other chronic diseases classified elsewhere: Secondary | ICD-10-CM | POA: Diagnosis not present

## 2013-06-20 DIAGNOSIS — I129 Hypertensive chronic kidney disease with stage 1 through stage 4 chronic kidney disease, or unspecified chronic kidney disease: Secondary | ICD-10-CM | POA: Diagnosis not present

## 2013-06-20 DIAGNOSIS — D649 Anemia, unspecified: Secondary | ICD-10-CM | POA: Diagnosis not present

## 2013-06-20 DIAGNOSIS — N183 Chronic kidney disease, stage 3 unspecified: Secondary | ICD-10-CM | POA: Diagnosis not present

## 2013-06-20 DIAGNOSIS — N019 Rapidly progressive nephritic syndrome with unspecified morphologic changes: Secondary | ICD-10-CM | POA: Diagnosis not present

## 2013-06-21 DIAGNOSIS — D638 Anemia in other chronic diseases classified elsewhere: Secondary | ICD-10-CM | POA: Diagnosis not present

## 2013-06-26 DIAGNOSIS — R350 Frequency of micturition: Secondary | ICD-10-CM | POA: Diagnosis not present

## 2013-06-26 DIAGNOSIS — R39198 Other difficulties with micturition: Secondary | ICD-10-CM | POA: Diagnosis not present

## 2013-06-26 DIAGNOSIS — R351 Nocturia: Secondary | ICD-10-CM | POA: Diagnosis not present

## 2013-06-26 DIAGNOSIS — R339 Retention of urine, unspecified: Secondary | ICD-10-CM | POA: Diagnosis not present

## 2013-06-27 DIAGNOSIS — H04129 Dry eye syndrome of unspecified lacrimal gland: Secondary | ICD-10-CM | POA: Diagnosis not present

## 2013-06-27 DIAGNOSIS — H4050X0 Glaucoma secondary to other eye disorders, unspecified eye, stage unspecified: Secondary | ICD-10-CM | POA: Diagnosis not present

## 2013-06-27 DIAGNOSIS — H01009 Unspecified blepharitis unspecified eye, unspecified eyelid: Secondary | ICD-10-CM | POA: Diagnosis not present

## 2013-06-27 DIAGNOSIS — H40009 Preglaucoma, unspecified, unspecified eye: Secondary | ICD-10-CM | POA: Diagnosis not present

## 2013-07-02 DIAGNOSIS — L570 Actinic keratosis: Secondary | ICD-10-CM | POA: Diagnosis not present

## 2013-07-02 DIAGNOSIS — Z85828 Personal history of other malignant neoplasm of skin: Secondary | ICD-10-CM | POA: Diagnosis not present

## 2013-08-22 ENCOUNTER — Other Ambulatory Visit: Payer: Self-pay

## 2013-08-22 MED ORDER — PANTOPRAZOLE SODIUM 40 MG PO TBEC: DELAYED_RELEASE_TABLET | ORAL | Status: DC

## 2013-08-22 MED ORDER — CLOPIDOGREL BISULFATE 75 MG PO TABS: ORAL_TABLET | ORAL | Status: DC

## 2013-08-22 MED ORDER — METOPROLOL TARTRATE 25 MG PO TABS: ORAL_TABLET | ORAL | Status: DC

## 2013-10-04 DIAGNOSIS — R350 Frequency of micturition: Secondary | ICD-10-CM | POA: Diagnosis not present

## 2013-10-04 DIAGNOSIS — R39198 Other difficulties with micturition: Secondary | ICD-10-CM | POA: Diagnosis not present

## 2013-10-04 DIAGNOSIS — R339 Retention of urine, unspecified: Secondary | ICD-10-CM | POA: Diagnosis not present

## 2013-10-04 DIAGNOSIS — R351 Nocturia: Secondary | ICD-10-CM | POA: Diagnosis not present

## 2013-10-09 ENCOUNTER — Telehealth: Payer: Self-pay | Admitting: Family Medicine

## 2013-10-09 NOTE — Telephone Encounter (Addendum)
Pt would like to know if you will accept him back as a pt. Pt thinks he may have a lump in his breast. Pt not seen in over 7 yrs.

## 2013-10-12 DIAGNOSIS — L570 Actinic keratosis: Secondary | ICD-10-CM | POA: Diagnosis not present

## 2013-10-12 DIAGNOSIS — D0460 Carcinoma in situ of skin of unspecified upper limb, including shoulder: Secondary | ICD-10-CM | POA: Diagnosis not present

## 2013-10-16 NOTE — Telephone Encounter (Signed)
Dr Sherren Mocha is not accepting new or re-established patients

## 2013-10-19 ENCOUNTER — Other Ambulatory Visit: Payer: Self-pay | Admitting: Nephrology

## 2013-10-19 ENCOUNTER — Other Ambulatory Visit: Payer: Self-pay

## 2013-10-19 DIAGNOSIS — N63 Unspecified lump in unspecified breast: Secondary | ICD-10-CM

## 2013-10-29 ENCOUNTER — Ambulatory Visit
Admission: RE | Admit: 2013-10-29 | Discharge: 2013-10-29 | Disposition: A | Discharge status: Home / Self Care | Payer: Medicare Other | Source: Ambulatory Visit

## 2013-10-29 DIAGNOSIS — N63 Unspecified lump in unspecified breast: Secondary | ICD-10-CM

## 2013-10-29 DIAGNOSIS — N62 Hypertrophy of breast: Secondary | ICD-10-CM | POA: Diagnosis not present

## 2013-11-23 ENCOUNTER — Other Ambulatory Visit: Payer: Self-pay | Admitting: *Deleted

## 2013-11-23 MED ORDER — METOPROLOL TARTRATE 25 MG PO TABS: ORAL_TABLET | ORAL | Status: DC

## 2013-12-12 ENCOUNTER — Other Ambulatory Visit: Payer: Self-pay

## 2013-12-12 MED ORDER — CLOPIDOGREL BISULFATE 75 MG PO TABS: ORAL_TABLET | ORAL | Status: DC

## 2013-12-19 ENCOUNTER — Other Ambulatory Visit: Payer: Self-pay | Admitting: Nephrology

## 2013-12-19 ENCOUNTER — Ambulatory Visit
Admission: RE | Admit: 2013-12-19 | Discharge: 2013-12-19 | Disposition: A | Discharge status: Home / Self Care | Payer: Medicare Other | Source: Ambulatory Visit | Attending: Nephrology | Admitting: Nephrology

## 2013-12-19 DIAGNOSIS — R059 Cough, unspecified: Secondary | ICD-10-CM

## 2013-12-19 DIAGNOSIS — N183 Chronic kidney disease, stage 3 unspecified: Secondary | ICD-10-CM | POA: Diagnosis not present

## 2013-12-19 DIAGNOSIS — N2581 Secondary hyperparathyroidism of renal origin: Secondary | ICD-10-CM | POA: Diagnosis not present

## 2013-12-19 DIAGNOSIS — D638 Anemia in other chronic diseases classified elsewhere: Secondary | ICD-10-CM | POA: Diagnosis not present

## 2013-12-19 DIAGNOSIS — I129 Hypertensive chronic kidney disease with stage 1 through stage 4 chronic kidney disease, or unspecified chronic kidney disease: Secondary | ICD-10-CM | POA: Diagnosis not present

## 2013-12-19 DIAGNOSIS — R05 Cough: Secondary | ICD-10-CM

## 2013-12-19 DIAGNOSIS — J984 Other disorders of lung: Secondary | ICD-10-CM | POA: Diagnosis not present

## 2013-12-24 ENCOUNTER — Other Ambulatory Visit: Payer: Self-pay | Admitting: *Deleted

## 2013-12-24 MED ORDER — METOPROLOL TARTRATE 25 MG PO TABS
ORAL_TABLET | ORAL | Status: DC
Start: 2013-12-24 — End: 2014-02-08

## 2014-01-14 ENCOUNTER — Encounter: Payer: Self-pay | Admitting: Pulmonary Disease

## 2014-01-14 ENCOUNTER — Ambulatory Visit (INDEPENDENT_AMBULATORY_CARE_PROVIDER_SITE_OTHER): Payer: Medicare Other | Admitting: Pulmonary Disease

## 2014-01-14 VITALS — BP 100/70 | HR 81 | Ht 73.0 in | Wt 240.0 lb

## 2014-01-14 DIAGNOSIS — J8410 Pulmonary fibrosis, unspecified: Secondary | ICD-10-CM

## 2014-01-14 NOTE — Progress Notes (Signed)
Subjective:    Patient ID: Luke Mcbride, male    DOB: March 25, 1936, 78 y.o.   MRN: [id number]  HPI  Luke Mcbride is here to see me for a history of vasculitis which has effected his sinuses, kidneys, and lungs.  He was diagnosed about 7 years ago and has undergone cytoxan twice and plasmapheresis.  He has been maintained on imuran for years and he has been stable.  However in the last few weeks he developed a cough with lack of sleep and weakenss and anorexia with a 8 pound weight loss.  He feels like he had a virus of some sort.  He took claritin for sinus problems which seems to help.  He also felt liek the oxybutinin may have contributed to the cough.  He stopped this and the ocugh went away in the a few days.  He did not have fever.  He is now back to baseline.  He has baseline shortness of breath which has not changed since the last visit.    He never smoked and has never been told he has a lung problem apart from the organizing pneumonia associated with the vasculitis.    Past Medical History  Diagnosis Date  . Hypertension   . Hypothyroidism   . ALLERGIC RHINITIS   . CAD (coronary artery disease)     a. 05/2000: NSTEMI/CABG x 6: LIMA->LAD, VG->D1, VG->OM1->2, VG->PDA->RPL;  b. 07/2007 MV: high lat infarct, no ischemia, EF 47%;  c. Cath/PCI: LM nl, LAD20p, 47m, D1 nl, D2 60-70ost, D3 nl, LCX 70ost, 128m, OM1/OM2 min irregs, RCA 30 diff, PDA 99, LIMA->LAD atretic, VG->D1 100, VG->OM1->2 100, VG->PDA->RPL 90p (4.0x23 Vision BMS);  c. 07/2012 Echo: EF 55%, gr1 DD.  [name] Hyperlipemia   . Iritis   . GERD (gastroesophageal reflux disease)   . Barrett's esophagus   . Local infection of skin and subcutaneous tissue   . Recurrent boils   . Neutropenia, drug-induced   . Vasculitis     a.  pauciimmune vasculitis with renal involvement and hx of transient hemoptysis in the past with associated BOOP, 2008 (renal bx 2008: GLN with vasculitis). b. History of treatment with 2 cycles of Cytoxan and  pheresis. H/o hemoptysis and pulm hemorrhage with 2nd cycle of cytoxan.  [name] Residual foreign body in soft tissue   . Membranoproliferative nephritis   . Hyperglycemia     Patient reported while on prednisone, had to take insulin  . Anemia   . ILD (interstitial lung disease)   . BOOP (bronchiolitis obliterans with organizing pneumonia)     a. s/p R VATS 2008.  . CKD (chronic kidney disease), stage III     a. renal bx 2008: GLN with vasculitis  . Glaucoma     a. Cannot see out of L eye.  . Hematuria     Microscopic     Family History  Problem Relation Age of Onset  . Depression Other   . Diabetes Other   . Prostate cancer Other      History   Social History  . Marital Status: Married    Spouse Name: N/A    Number of Children: N/A  . Years of Education: N/A   Occupational History  . [employer] History Main Topics  . Smoking status: Never Smoker   . Smokeless tobacco: Never Used  . Alcohol Use: No  . Drug Use: No  . Sexual Activity: Not on file   Other Topics Concern  .  Not on file   Social History Narrative   Regular exercise - yes     No Known Allergies   Outpatient Prescriptions Prior to Visit  Medication Sig Dispense Refill  . aspirin EC 81 MG EC tablet Take 1 tablet (81 mg total) by mouth daily.  30 tablet    . azaTHIOprine (IMURAN) 50 MG tablet 2 in the morning, 1 in the afternoon       . clopidogrel (PLAVIX) 75 MG tablet TAKE 1 TABLET (75 MG TOTAL) BY MOUTH DAILY WITH BREAKFAST.  30 tablet  1  . CRESTOR 40 MG tablet TAKE 1 TABLET BY MOUTH ONCE A DAY  30 tablet  0  . finasteride (PROSCAR) 5 MG tablet Take 5 mg by mouth daily.      [name] levothyroxine (SYNTHROID, LEVOTHROID) 100 MCG tablet Take 100 mcg by mouth daily.        . metoprolol tartrate (LOPRESSOR) 25 MG tablet TAKE 0.5 TABLETS (12.5 MG TOTAL) BY MOUTH 2 (TWO) TIMES DAILY.  30 tablet  1  . Multiple Vitamin (MULTIVITAMIN) tablet Take 1 tablet by mouth daily.        . nitroGLYCERIN (NITROSTAT)  0.4 MG SL tablet Place 1 tablet (0.4 mg total) under the tongue every 5 (five) minutes as needed. May repeat x3  25 tablet  11  . pantoprazole (PROTONIX) 40 MG tablet TAKE 1 TABLET (40 MG TOTAL) BY MOUTH DAILY.  30 tablet  3  . telmisartan (MICARDIS) 20 MG tablet Take 40 mg by mouth as directed. 1/2  TAB      . Tamsulosin HCl (FLOMAX) 0.4 MG CAPS Take 0.4 mg by mouth daily.        [name] oxybutynin (DITROPAN-XL) 5 MG 24 hr tablet Take 5 mg by mouth daily.       No facility-administered medications prior to visit.      Review of Systems  Constitutional: Negative for fever and unexpected weight change.  HENT: Positive for postnasal drip and sinus pressure. Negative for congestion, dental problem, ear pain, nosebleeds, rhinorrhea, sneezing, sore throat and trouble swallowing.   Eyes: Negative for redness and itching.  Respiratory: Positive for shortness of breath. Negative for cough, chest tightness and wheezing.   Cardiovascular: Negative for palpitations and leg swelling.  Gastrointestinal: Negative for nausea and vomiting.  Genitourinary: Negative for dysuria.  Musculoskeletal: Negative for joint swelling.  Skin: Negative for rash.  Neurological: Negative for headaches.  Hematological: Does not bruise/bleed easily.  Psychiatric/Behavioral: Negative for dysphoric mood. The patient is not nervous/anxious.        Objective:   Physical Exam Filed Vitals:   01/14/14 0921  BP: 100/70  Pulse: 81  Height: 6\' 1"  (1.854 m)  Weight: 240 lb (108.863 kg)  SpO2: 97%   Gen: chronically ill appearing, no acute distress HEENT: NCAT, PERRL, EOMi, OP clear, neck supple without masses PULM: CTA B CV: RRR, no mgr, no JVD AB: BS+, soft, nontender, no hsm Ext: warm, no edema, no clubbing, no cyanosis Derm: extensor surface bruising on hands, no rash Neuro: A&Ox4, CN II-XII intact, strength 5/5 in all 4 extremities  12/19/2013 CXR > mild scarring R lung from surgery, no infiltrate; sternal wires  noted     Assessment & Plan:   INTERSTITIAL LUNG DISEASE He has a history of possibly immune vasculitis as well as organizing pneumonia. I believe the 2 are the same disease process.   At this point he shows little evidence of disease activity as his chest  x-ray, lung exam and oximetry are normal. I do not think there is a role for pulmonary function testing right now. I have personally reviewed the images of the chest x-ray and do not see evidence of disease recurrence.  It sounds like the recent self-limited episode of cough he experienced was due to a viral illness, less likely the oxybutynin.  Plan: -Continue annual followup with [place] -Continue Imuran as prescribed by nephrology -Flu shot in the fall   Updated Medication List Outpatient Encounter Prescriptions as of 01/14/2014  Medication Sig  . aspirin EC 81 MG EC tablet Take 1 tablet (81 mg total) by mouth daily.  [name] azaTHIOprine (IMURAN) 50 MG tablet 2 in the morning, 1 in the afternoon   . clopidogrel (PLAVIX) 75 MG tablet TAKE 1 TABLET (75 MG TOTAL) BY MOUTH DAILY WITH BREAKFAST.  [name] CRESTOR 40 MG tablet TAKE 1 TABLET BY MOUTH ONCE A DAY  . finasteride (PROSCAR) 5 MG tablet Take 5 mg by mouth daily.  [name] levothyroxine (SYNTHROID, LEVOTHROID) 100 MCG tablet Take 100 mcg by mouth daily.    . metoprolol tartrate (LOPRESSOR) 25 MG tablet TAKE 0.5 TABLETS (12.5 MG TOTAL) BY MOUTH 2 (TWO) TIMES DAILY.  . Multiple Vitamin (MULTIVITAMIN) tablet Take 1 tablet by mouth daily.    . nitroGLYCERIN (NITROSTAT) 0.4 MG SL tablet Place 1 tablet (0.4 mg total) under the tongue every 5 (five) minutes as needed. May repeat x3  . pantoprazole (PROTONIX) 40 MG tablet TAKE 1 TABLET (40 MG TOTAL) BY MOUTH DAILY.  [name] telmisartan (MICARDIS) 20 MG tablet Take 40 mg by mouth as directed. 1/2  TAB  . Tamsulosin HCl (FLOMAX) 0.4 MG CAPS Take 0.4 mg by mouth daily.    . [DISCONTINUED] oxybutynin (DITROPAN-XL) 5 MG 24 hr tablet Take 5 mg by mouth daily.

## 2014-01-14 NOTE — Patient Instructions (Signed)
Keep taking your imuran as you are doing If you develop fever, coughing up blood, lengthy sinus symptoms, rash or unexplained weight loss see your doctor We will see you back in one year or sooner if needed

## 2014-01-14 NOTE — Assessment & Plan Note (Signed)
He has a history of possibly immune vasculitis as well as organizing pneumonia. I believe the 2 are the same disease process.   At this point he shows little evidence of disease activity as his chest x-ray, lung exam and oximetry are normal. I do not think there is a role for pulmonary function testing right now. I have personally reviewed the images of the chest x-ray and do not see evidence of disease recurrence.  It sounds like the recent self-limited episode of cough he experienced was due to a viral illness, less likely the oxybutynin.  Plan: -Continue annual followup with [place] -Continue Imuran as prescribed by nephrology -Flu shot in the fall

## 2014-01-22 ENCOUNTER — Ambulatory Visit: Payer: Medicare Other | Admitting: Cardiovascular Disease

## 2014-02-01 ENCOUNTER — Encounter: Payer: Self-pay | Admitting: Cardiovascular Disease

## 2014-02-04 ENCOUNTER — Other Ambulatory Visit: Payer: Self-pay | Admitting: *Deleted

## 2014-02-04 MED ORDER — PANTOPRAZOLE SODIUM 40 MG PO TBEC: DELAYED_RELEASE_TABLET | ORAL | Status: DC

## 2014-02-04 MED ORDER — CLOPIDOGREL BISULFATE 75 MG PO TABS: ORAL_TABLET | ORAL | Status: DC

## 2014-02-05 ENCOUNTER — Ambulatory Visit: Payer: Medicare Other | Admitting: Cardiovascular Disease

## 2014-02-05 DIAGNOSIS — D044 Carcinoma in situ of skin of scalp and neck: Secondary | ICD-10-CM | POA: Diagnosis not present

## 2014-02-05 DIAGNOSIS — L570 Actinic keratosis: Secondary | ICD-10-CM | POA: Diagnosis not present

## 2014-02-07 ENCOUNTER — Ambulatory Visit: Payer: Medicare Other | Admitting: Physician Assistant

## 2014-02-08 ENCOUNTER — Encounter: Payer: Self-pay | Admitting: Physician Assistant

## 2014-02-08 ENCOUNTER — Ambulatory Visit (INDEPENDENT_AMBULATORY_CARE_PROVIDER_SITE_OTHER): Payer: Medicare Other | Admitting: Physician Assistant

## 2014-02-08 VITALS — BP 136/82 | HR 76 | Ht 73.0 in | Wt 243.0 lb

## 2014-02-08 DIAGNOSIS — I10 Essential (primary) hypertension: Secondary | ICD-10-CM

## 2014-02-08 DIAGNOSIS — E119 Type 2 diabetes mellitus without complications: Secondary | ICD-10-CM

## 2014-02-08 DIAGNOSIS — N183 Chronic kidney disease, stage 3 unspecified: Secondary | ICD-10-CM

## 2014-02-08 DIAGNOSIS — E7800 Pure hypercholesterolemia, unspecified: Secondary | ICD-10-CM | POA: Diagnosis not present

## 2014-02-08 DIAGNOSIS — I2510 Atherosclerotic heart disease of native coronary artery without angina pectoris: Secondary | ICD-10-CM | POA: Diagnosis not present

## 2014-02-08 MED ORDER — METOPROLOL TARTRATE 25 MG PO TABS: ORAL_TABLET | ORAL | Status: DC

## 2014-02-08 MED ORDER — PANTOPRAZOLE SODIUM 40 MG PO TBEC: DELAYED_RELEASE_TABLET | ORAL | Status: DC

## 2014-02-08 MED ORDER — CLOPIDOGREL BISULFATE 75 MG PO TABS: ORAL_TABLET | ORAL | Status: DC

## 2014-02-08 NOTE — Patient Instructions (Signed)
Your physician recommends that you continue on your current medications as directed. Please refer to the Current Medication list given to you today.   Your physician recommends that you return for lab work in: [place] [facility] [place] wants you to follow-up in: WITH DR Kaaawa will receive a reminder letter in the mail two months in advance. If you don't receive a letter, please call our office to schedule the follow-up appointment.

## 2014-02-08 NOTE — Progress Notes (Signed)
Cardiology Office Note    Date:  02/08/2014   ID:  BRAXTIN FILIPOWICZ, DOB 10/28/1935, MRN [id number]  PCP:  Donetta Potts, MD  Cardiologist:  Dr. Jenkins Rouge       History of Present Illness: Luke Mcbride is a 78 y.o. male with a hx of CAD, s/p CABG in 2002, DM2, HTN, HL, CKD, interstitial lung disease, hypothyroidism.  Admitted with NSTEMI in 3/14.  LHC demonstrated severe disease in the S-PDA/PL that was treated with BMS.  LIMA-LAD was atretic but disease in the LAD was moderate. SVG-OM and SVG-diagonal were known to be occluded.  Last seen by Dr. [name] 5/14. He returns for follow up. He has chronic dyspnea with exertion. He is NYHA IIb. He denies significant change. He denies chest pain, orthopnea, PND or edema. He denies syncope.   Studies:  - LHC 07/26/12: Proximal LAD 20%, mid LAD 50%, ostial D2 60-70%, ostial CFX 70%, mid CFX occluded, proximal RCA 30% with diffuse 30%, proximal PDA subtotally occluded, atretic LIMA-LAD, occluded SVG-DX and occluded SVG-OM (chronic), SVG-PDA/PL with severe disease. PCI: Vision BMS to the SVG-PDA/PL with distal protection  - Echo 07/27/12: Mild LVH, EF 55%, wall motion could not be assessed, grade 1 diastolic dysfunction, trivial MR, moderate LAE, mild RVE, normal RV function, mild RAE.   Recent Labs/Images:  No results found for requested labs within last 365 days.   Dg Chest 2 View   12/19/2013    IMPRESSION: Scarring right lower lobe. No edema or consolidation. No apparent adenopathy.   Electronically Signed   By: Lowella Grip M.D.   On: 12/19/2013 11:00     Wt Readings from Last 3 Encounters:  02/08/14 243 lb (110.224 kg)  01/14/14 240 lb (108.863 kg)  09/20/12 236 lb 12.8 oz (107.412 kg)     Past Medical History  Diagnosis Date  . Hypertension   . Hypothyroidism   . ALLERGIC RHINITIS   . CAD (coronary artery disease)     a. 05/2000: NSTEMI/CABG x 6: LIMA->LAD, VG->D1, VG->OM1->2, VG->PDA->RPL;  b. 07/2007 MV: high lat  infarct, no ischemia, EF 47%;  c. Cath/PCI: LM nl, LAD20p, 49m, D1 nl, D2 60-70ost, D3 nl, LCX 70ost, 153m, OM1/OM2 min irregs, RCA 30 diff, PDA 99, LIMA->LAD atretic, VG->D1 100, VG->OM1->2 100, VG->PDA->RPL 90p (4.0x23 Vision BMS);  c. 07/2012 Echo: EF 55%, gr1 DD.  [name] Hyperlipemia   . Iritis   . GERD (gastroesophageal reflux disease)   . Barrett's esophagus   . Local infection of skin and subcutaneous tissue   . Recurrent boils   . Neutropenia, drug-induced   . Vasculitis     a.  pauciimmune vasculitis with renal involvement and hx of transient hemoptysis in the past with associated BOOP, 2008 (renal bx 2008: GLN with vasculitis). b. History of treatment with 2 cycles of Cytoxan and pheresis. H/o hemoptysis and pulm hemorrhage with 2nd cycle of cytoxan.  [name] Residual foreign body in soft tissue   . Membranoproliferative nephritis   . Hyperglycemia     Patient reported while on prednisone, had to take insulin  . Anemia   . ILD (interstitial lung disease)   . BOOP (bronchiolitis obliterans with organizing pneumonia)     a. s/p R VATS 2008.  . CKD (chronic kidney disease), stage III     a. renal bx 2008: GLN with vasculitis  . Glaucoma     a. Cannot see out of L eye.  . Hematuria     Microscopic  Current Outpatient Prescriptions  Medication Sig Dispense Refill  . aspirin EC 81 MG EC tablet Take 1 tablet (81 mg total) by mouth daily.  30 tablet    . azaTHIOprine (IMURAN) 50 MG tablet 2 in the morning, 1 in the afternoon       . clopidogrel (PLAVIX) 75 MG tablet TAKE 1 TABLET (75 MG TOTAL) BY MOUTH DAILY WITH BREAKFAST.  30 tablet  0  . CRESTOR 40 MG tablet TAKE 1 TABLET BY MOUTH ONCE A DAY  30 tablet  0  . finasteride (PROSCAR) 5 MG tablet Take 5 mg by mouth daily.      [name] levothyroxine (SYNTHROID, LEVOTHROID) 100 MCG tablet Take 100 mcg by mouth daily.        . metoprolol tartrate (LOPRESSOR) 25 MG tablet TAKE 0.5 TABLETS (12.5 MG TOTAL) BY MOUTH 2 (TWO) TIMES DAILY.  30 tablet  1  .  Multiple Vitamin (MULTIVITAMIN) tablet Take 1 tablet by mouth daily.        . nitroGLYCERIN (NITROSTAT) 0.4 MG SL tablet Place 1 tablet (0.4 mg total) under the tongue every 5 (five) minutes as needed. May repeat x3  25 tablet  11  . pantoprazole (PROTONIX) 40 MG tablet TAKE 1 TABLET (40 MG TOTAL) BY MOUTH DAILY.  30 tablet  0  . Tamsulosin HCl (FLOMAX) 0.4 MG CAPS Take 0.4 mg by mouth daily.        [name] telmisartan (MICARDIS) 40 MG tablet Take 20 mg by mouth daily. Take 1/2 tab for 20 mg daily.       No current facility-administered medications for this visit.     Allergies:   Review of patient's allergies indicates no known allergies.   Social History:  The patient  reports that he has never smoked. He has never used smokeless tobacco. He reports that he does not drink alcohol or use illicit drugs.   Family History:  The patient's family history includes Depression in his other; Diabetes in his mother and other; Heart disease in his mother; Prostate cancer in his father and other.   ROS:  Please see the history of present illness.       All other systems reviewed and negative.    PHYSICAL EXAM: VS:  BP 136/82  Pulse 76  Ht 6\' 1"  (1.854 m)  Wt 243 lb (110.224 kg)  BMI 32.07 kg/m2 Well nourished, well developed, in no acute distress HEENT: normal Neck:  no JVD Cardiac:  normal S1, S2;  RRR; no murmur Lungs:   clear to auscultation bilaterally, no wheezing, rhonchi or rales Abd: soft, nontender, no hepatomegaly Ext:  no edema Skin: warm and dry Neuro:  CNs 2-12 intact, no focal abnormalities noted  EKG:  NSR, HR 76, normal axis, nonspecific ST-T wave changes      ASSESSMENT AND PLAN:  1. Coronary artery disease: Stable. No angina. Continue aspirin, Plavix, statin, beta blocker. 2. Essential hypertension, benign: Controlled. 3. HYPERCHOLESTEROLEMIA - Continue statin.  Plan: Hepatic function panel, Lipid Profile 4. DIABETES MELLITUS, TYPE II -  Good control. Plan: HgB A1c  (patient request).  5. CKD (chronic kidney disease), stage III:  Continue followup with nephrology.  Disposition:    FU with Dr. [name] in 6 months.   Signed, Versie Starks, MHS 02/08/2014 12:09 PM    [address], [place], [place]  [address] Phone: [phone]; Fax: [phone]

## 2014-02-11 ENCOUNTER — Other Ambulatory Visit (INDEPENDENT_AMBULATORY_CARE_PROVIDER_SITE_OTHER): Payer: Medicare Other | Admitting: *Deleted

## 2014-02-11 DIAGNOSIS — E7800 Pure hypercholesterolemia, unspecified: Secondary | ICD-10-CM

## 2014-02-11 DIAGNOSIS — E119 Type 2 diabetes mellitus without complications: Secondary | ICD-10-CM | POA: Diagnosis not present

## 2014-02-11 LAB — LIPID PANEL
Cholesterol: 157 mg/dL (ref 0–200)
HDL: 36.3 mg/dL — ABNORMAL LOW (ref >39.00)
LDL Cholesterol: 91 mg/dL (ref 0–99)
NonHDL: 120.7
TRIGLYCERIDES: 150 mg/dL — AB (ref 0.0–149.0)
Total CHOL/HDL Ratio: 4
VLDL: 30 mg/dL (ref 0.0–40.0)

## 2014-02-11 LAB — HEPATIC FUNCTION PANEL
ALK PHOS: 43 U/L (ref 39–117)
ALT: 18 U/L (ref 0–53)
AST: 19 U/L (ref 0–37)
Albumin: 3.5 g/dL (ref 3.5–5.2)
Bilirubin, Direct: 0 mg/dL (ref 0.0–0.3)
Total Bilirubin: 0.7 mg/dL (ref 0.2–1.2)
Total Protein: 6.8 g/dL (ref 6.0–8.3)

## 2014-02-11 LAB — HEMOGLOBIN A1C: Hgb A1c MFr Bld: 5.9 % (ref 4.6–6.5)

## 2014-03-08 DIAGNOSIS — Z08 Encounter for follow-up examination after completed treatment for malignant neoplasm: Secondary | ICD-10-CM | POA: Diagnosis not present

## 2014-03-08 DIAGNOSIS — Z85828 Personal history of other malignant neoplasm of skin: Secondary | ICD-10-CM | POA: Diagnosis not present

## 2014-04-25 ENCOUNTER — Encounter (HOSPITAL_COMMUNITY): Payer: Self-pay | Admitting: Cardiovascular Disease

## 2014-04-26 DIAGNOSIS — N2581 Secondary hyperparathyroidism of renal origin: Secondary | ICD-10-CM | POA: Diagnosis not present

## 2014-04-26 DIAGNOSIS — I129 Hypertensive chronic kidney disease with stage 1 through stage 4 chronic kidney disease, or unspecified chronic kidney disease: Secondary | ICD-10-CM | POA: Diagnosis not present

## 2014-04-26 DIAGNOSIS — D638 Anemia in other chronic diseases classified elsewhere: Secondary | ICD-10-CM | POA: Diagnosis not present

## 2014-04-26 DIAGNOSIS — N183 Chronic kidney disease, stage 3 (moderate): Secondary | ICD-10-CM | POA: Diagnosis not present

## 2014-07-17 ENCOUNTER — Encounter (HOSPITAL_BASED_OUTPATIENT_CLINIC_OR_DEPARTMENT_OTHER): Payer: Self-pay

## 2014-07-17 ENCOUNTER — Emergency Department (HOSPITAL_BASED_OUTPATIENT_CLINIC_OR_DEPARTMENT_OTHER): Payer: Medicare Other

## 2014-07-17 ENCOUNTER — Emergency Department (HOSPITAL_BASED_OUTPATIENT_CLINIC_OR_DEPARTMENT_OTHER)
Admission: EM | Admit: 2014-07-17 | Discharge: 2014-07-17 | Disposition: A | Discharge status: Home / Self Care | Payer: Medicare Other | Attending: Emergency Medicine | Admitting: Emergency Medicine

## 2014-07-17 DIAGNOSIS — N183 Chronic kidney disease, stage 3 (moderate): Secondary | ICD-10-CM | POA: Insufficient documentation

## 2014-07-17 DIAGNOSIS — Z7902 Long term (current) use of antithrombotics/antiplatelets: Secondary | ICD-10-CM | POA: Diagnosis not present

## 2014-07-17 DIAGNOSIS — R609 Edema, unspecified: Secondary | ICD-10-CM | POA: Diagnosis not present

## 2014-07-17 DIAGNOSIS — W1830XA Fall on same level, unspecified, initial encounter: Secondary | ICD-10-CM | POA: Diagnosis not present

## 2014-07-17 DIAGNOSIS — Z7982 Long term (current) use of aspirin: Secondary | ICD-10-CM | POA: Diagnosis not present

## 2014-07-17 DIAGNOSIS — I129 Hypertensive chronic kidney disease with stage 1 through stage 4 chronic kidney disease, or unspecified chronic kidney disease: Secondary | ICD-10-CM | POA: Insufficient documentation

## 2014-07-17 DIAGNOSIS — Y998 Other external cause status: Secondary | ICD-10-CM | POA: Insufficient documentation

## 2014-07-17 DIAGNOSIS — Z862 Personal history of diseases of the blood and blood-forming organs and certain disorders involving the immune mechanism: Secondary | ICD-10-CM | POA: Insufficient documentation

## 2014-07-17 DIAGNOSIS — Z8701 Personal history of pneumonia (recurrent): Secondary | ICD-10-CM | POA: Insufficient documentation

## 2014-07-17 DIAGNOSIS — I10 Essential (primary) hypertension: Secondary | ICD-10-CM | POA: Diagnosis not present

## 2014-07-17 DIAGNOSIS — Y9389 Activity, other specified: Secondary | ICD-10-CM | POA: Insufficient documentation

## 2014-07-17 DIAGNOSIS — E039 Hypothyroidism, unspecified: Secondary | ICD-10-CM | POA: Diagnosis not present

## 2014-07-17 DIAGNOSIS — M7989 Other specified soft tissue disorders: Secondary | ICD-10-CM

## 2014-07-17 DIAGNOSIS — S8990XA Unspecified injury of unspecified lower leg, initial encounter: Secondary | ICD-10-CM | POA: Diagnosis not present

## 2014-07-17 DIAGNOSIS — S2231XA Fracture of one rib, right side, initial encounter for closed fracture: Secondary | ICD-10-CM

## 2014-07-17 DIAGNOSIS — Z9889 Other specified postprocedural states: Secondary | ICD-10-CM | POA: Insufficient documentation

## 2014-07-17 DIAGNOSIS — I2510 Atherosclerotic heart disease of native coronary artery without angina pectoris: Secondary | ICD-10-CM | POA: Insufficient documentation

## 2014-07-17 DIAGNOSIS — S2241XA Multiple fractures of ribs, right side, initial encounter for closed fracture: Secondary | ICD-10-CM | POA: Insufficient documentation

## 2014-07-17 DIAGNOSIS — Z87448 Personal history of other diseases of urinary system: Secondary | ICD-10-CM | POA: Insufficient documentation

## 2014-07-17 DIAGNOSIS — Z872 Personal history of diseases of the skin and subcutaneous tissue: Secondary | ICD-10-CM | POA: Diagnosis not present

## 2014-07-17 DIAGNOSIS — Z79899 Other long term (current) drug therapy: Secondary | ICD-10-CM | POA: Insufficient documentation

## 2014-07-17 DIAGNOSIS — H409 Unspecified glaucoma: Secondary | ICD-10-CM | POA: Diagnosis not present

## 2014-07-17 DIAGNOSIS — Y9289 Other specified places as the place of occurrence of the external cause: Secondary | ICD-10-CM | POA: Insufficient documentation

## 2014-07-17 DIAGNOSIS — D638 Anemia in other chronic diseases classified elsewhere: Secondary | ICD-10-CM | POA: Diagnosis not present

## 2014-07-17 DIAGNOSIS — Z951 Presence of aortocoronary bypass graft: Secondary | ICD-10-CM | POA: Insufficient documentation

## 2014-07-17 DIAGNOSIS — S299XXA Unspecified injury of thorax, initial encounter: Secondary | ICD-10-CM | POA: Diagnosis present

## 2014-07-17 DIAGNOSIS — K219 Gastro-esophageal reflux disease without esophagitis: Secondary | ICD-10-CM | POA: Insufficient documentation

## 2014-07-17 DIAGNOSIS — M79609 Pain in unspecified limb: Secondary | ICD-10-CM | POA: Diagnosis not present

## 2014-07-17 LAB — CBC WITH DIFFERENTIAL/PLATELET
Basophils Absolute: 0 10*3/uL (ref 0.0–0.1)
Basophils Relative: 0 % (ref 0–1)
Eosinophils Absolute: 0.1 10*3/uL (ref 0.0–0.7)
Eosinophils Relative: 2 % (ref 0–5)
HCT: 40.9 % (ref 39.0–52.0)
Hemoglobin: 12.9 g/dL — ABNORMAL LOW (ref 13.0–17.0)
Lymphocytes Relative: 6 % — ABNORMAL LOW (ref 12–46)
Lymphs Abs: 0.3 10*3/uL — ABNORMAL LOW (ref 0.7–4.0)
MCH: 33.2 pg (ref 26.0–34.0)
MCHC: 31.5 g/dL (ref 30.0–36.0)
MCV: 105.1 fL — ABNORMAL HIGH (ref 78.0–100.0)
Monocytes Absolute: 0.7 10*3/uL (ref 0.1–1.0)
Monocytes Relative: 13 % — ABNORMAL HIGH (ref 3–12)
Neutro Abs: 4.4 10*3/uL (ref 1.7–7.7)
Neutrophils Relative %: 79 % — ABNORMAL HIGH (ref 43–77)
Platelets: 156 10*3/uL (ref 150–400)
RBC: 3.89 MIL/uL — ABNORMAL LOW (ref 4.22–5.81)
RDW: 16.9 % — ABNORMAL HIGH (ref 11.5–15.5)
WBC: 5.5 10*3/uL (ref 4.0–10.5)

## 2014-07-17 LAB — URINALYSIS, ROUTINE W REFLEX MICROSCOPIC
Bilirubin Urine: NEGATIVE
Glucose, UA: NEGATIVE mg/dL
Ketones, ur: NEGATIVE mg/dL
Leukocytes, UA: NEGATIVE
Nitrite: NEGATIVE
PROTEIN: 100 mg/dL — AB
Specific Gravity, Urine: 1.012 (ref 1.005–1.030)
UROBILINOGEN UA: 0.2 mg/dL (ref 0.0–1.0)
pH: 6 (ref 5.0–8.0)

## 2014-07-17 LAB — TROPONIN I: Troponin I: <0.03 ng/mL (ref <0.031)

## 2014-07-17 LAB — COMPREHENSIVE METABOLIC PANEL
ALT: 24 U/L (ref 0–53)
AST: 24 U/L (ref 0–37)
Albumin: 3.8 g/dL (ref 3.5–5.2)
Alkaline Phosphatase: 59 U/L (ref 39–117)
Anion gap: 3 — ABNORMAL LOW (ref 5–15)
BUN: 31 mg/dL — ABNORMAL HIGH (ref 6–23)
CALCIUM: 8.4 mg/dL (ref 8.4–10.5)
CO2: 25 mmol/L (ref 19–32)
Chloride: 107 mmol/L (ref 96–112)
Creatinine, Ser: 1.8 mg/dL — ABNORMAL HIGH (ref 0.50–1.35)
GFR, EST AFRICAN AMERICAN: 40 mL/min — AB (ref >90)
GFR, EST NON AFRICAN AMERICAN: 35 mL/min — AB (ref >90)
GLUCOSE: 102 mg/dL — AB (ref 70–99)
Potassium: 4.6 mmol/L (ref 3.5–5.1)
Sodium: 135 mmol/L (ref 135–145)
TOTAL PROTEIN: 7.6 g/dL (ref 6.0–8.3)
Total Bilirubin: 0.7 mg/dL (ref 0.3–1.2)

## 2014-07-17 LAB — URINE MICROSCOPIC-ADD ON

## 2014-07-17 LAB — PROTIME-INR
INR: 1.01 (ref 0.00–1.49)
PROTHROMBIN TIME: 13.3 s (ref 11.6–15.2)

## 2014-07-17 LAB — LIPASE, BLOOD: LIPASE: 40 U/L (ref 11–59)

## 2014-07-17 MED ORDER — FUROSEMIDE 40 MG PO TABS: 40.0000 mg | ORAL_TABLET | Freq: Every day | ORAL | Status: DC

## 2014-07-17 MED ORDER — TRAMADOL HCL 50 MG PO TABS: 50.0000 mg | ORAL_TABLET | Freq: Four times a day (QID) | ORAL | Status: DC | PRN

## 2014-07-17 NOTE — ED Provider Notes (Signed)
CSN: [id number]     Arrival date & time 07/17/14  1052 History   First MD Initiated Contact with Patient 07/17/14 1126     Chief Complaint  Patient presents with  . Fall     (Consider location/radiation/quality/duration/timing/severity/associated sxs/prior Treatment) HPI The patient states he tripped over a chain saw on Sunday which was 3 days ago. He fell landing on his right side onto concrete. He denies anything poked him in the side. Since that time the area has been painful with any movement or deep breath. He denies lightheadedness or near syncope. He has had no vomiting. His appetite has been good and there is been no pain with eating. He denies feeling increasing short of breath except that it is painful with a deep breath. He does report that he has increased swelling in both of his lower legs. That has come up over the past 2 days. He reports some pain in the left leg and particularly the bottom of the foot. He has kidney disease and was at his doctor's office today, but unable to be seen by the doctor. He reports the nurse at the office instructed him to come to the emergency department to get his legs checked and make sure he didn't have a blood clot in them. He has a prior history of lower extremity swelling however it is more swollen now than they have been in the recent past. No known history of DVT. Past Medical History  Diagnosis Date  . Hypertension   . Hypothyroidism   . ALLERGIC RHINITIS   . CAD (coronary artery disease)     a. 05/2000: NSTEMI/CABG x 6: LIMA->LAD, VG->D1, VG->OM1->2, VG->PDA->RPL;  b. 07/2007 MV: high lat infarct, no ischemia, EF 47%;  c. Cath/PCI: LM nl, LAD20p, 15m, D1 nl, D2 60-70ost, D3 nl, LCX 70ost, 158m, OM1/OM2 min irregs, RCA 30 diff, PDA 99, LIMA->LAD atretic, VG->D1 100, VG->OM1->2 100, VG->PDA->RPL 90p (4.0x23 Vision BMS);  c. 07/2012 Echo: EF 55%, gr1 DD.  [name] Hyperlipemia   . Iritis   . GERD (gastroesophageal reflux disease)   . Barrett's esophagus    . Local infection of skin and subcutaneous tissue   . Recurrent boils   . Neutropenia, drug-induced   . Vasculitis     a.  pauciimmune vasculitis with renal involvement and hx of transient hemoptysis in the past with associated BOOP, 2008 (renal bx 2008: GLN with vasculitis). b. History of treatment with 2 cycles of Cytoxan and pheresis. H/o hemoptysis and pulm hemorrhage with 2nd cycle of cytoxan.  [name] Residual foreign body in soft tissue   . Membranoproliferative nephritis   . Hyperglycemia     Patient reported while on prednisone, had to take insulin  . Anemia   . ILD (interstitial lung disease)   . BOOP (bronchiolitis obliterans with organizing pneumonia)     a. s/p R VATS 2008.  . CKD (chronic kidney disease), stage III     a. renal bx 2008: GLN with vasculitis  . Glaucoma     a. Cannot see out of L eye.  . Hematuria     Microscopic   Past Surgical History  Procedure Laterality Date  . Coronary artery bypass graft  2002  . Cataract extraction    . Toe amputation  2009    hammer toe  . Left heart catheterization with coronary/graft angiogram N/A 07/26/2012    Procedure: LEFT HEART CATHETERIZATION WITH Beatrix Fetters;  Surgeon: Wellington Hampshire, MD;  Location: [facility] CATH LAB;  Service: Cardiovascular;  Laterality: N/A;  . Percutaneous coronary stent intervention (pci-s)  07/26/2012    Procedure: PERCUTANEOUS CORONARY STENT INTERVENTION (PCI-S);  Surgeon: Wellington Hampshire, MD;  Location: [facility] CATH LAB;  Service: Cardiovascular;;   Family History  Problem Relation Age of Onset  . Depression Other   . Diabetes Other   . Prostate cancer Other   . Heart disease Mother   . Diabetes Mother   . Prostate cancer Father    History  Substance Use Topics  . Smoking status: Never Smoker   . Smokeless tobacco: Never Used  . Alcohol Use: No    Review of Systems 10 Systems reviewed and are negative for acute change except as noted in the HPI.    Allergies  Review of  patient's allergies indicates no known allergies.  Home Medications   Prior to Admission medications   Medication Sig Start Date End Date Taking? Authorizing Provider  aspirin EC 81 MG EC tablet Take 1 tablet (81 mg total) by mouth daily. 07/29/12   Rogelia Mire, NP  azaTHIOprine (IMURAN) 50 MG tablet 2 in the morning, 1 in the afternoon     Historical Provider, MD  clopidogrel (PLAVIX) 75 MG tablet TAKE 1 TABLET (75 MG TOTAL) BY MOUTH DAILY WITH BREAKFAST. 02/08/14   [name], MD  CRESTOR 40 MG tablet TAKE 1 TABLET BY MOUTH ONCE A DAY 06/15/13   [name], MD  finasteride (PROSCAR) 5 MG tablet Take 5 mg by mouth daily.    Historical Provider, MD  furosemide (LASIX) 40 MG tablet Take 1 tablet (40 mg total) by mouth daily. 07/17/14   Charlesetta Shanks, MD  levothyroxine (SYNTHROID, LEVOTHROID) 100 MCG tablet Take 100 mcg by mouth daily.      Historical Provider, MD  metoprolol tartrate (LOPRESSOR) 25 MG tablet TAKE 0.5 TABLETS (12.5 MG TOTAL) BY MOUTH 2 (TWO) TIMES DAILY. 02/08/14   [name], MD  Multiple Vitamin (MULTIVITAMIN) tablet Take 1 tablet by mouth daily.      Historical Provider, MD  nitroGLYCERIN (NITROSTAT) 0.4 MG SL tablet Place 1 tablet (0.4 mg total) under the tongue every 5 (five) minutes as needed. May repeat x3 05/26/11   [name], MD  pantoprazole (PROTONIX) 40 MG tablet TAKE 1 TABLET (40 MG TOTAL) BY MOUTH DAILY. 02/08/14   [name], MD  Tamsulosin HCl (FLOMAX) 0.4 MG CAPS Take 0.4 mg by mouth daily.      Historical Provider, MD  telmisartan (MICARDIS) 40 MG tablet Take 20 mg by mouth daily. Take 1/2 tab for 20 mg daily.    Historical Provider, MD  traMADol (ULTRAM) 50 MG tablet Take 1 tablet (50 mg total) by mouth every 6 (six) hours as needed. 07/17/14   Charlesetta Shanks, MD   BP 151/89 mmHg  Pulse 74  Temp(Src) 98 F (36.7 C) (Oral)  Resp 18  Ht 6\' 1"  (1.854 m)  Wt 235 lb (106.595 kg)  BMI 31.01 kg/m2  SpO2 96% Physical Exam  Constitutional: He  is oriented to person, place, and time.  The patient is obese. He is alert and nontoxic. He does not show signs of respiratory distress at rest. He is sitting up at the edge of the stretcher.  HENT:  Head: Normocephalic and atraumatic.  Mouth/Throat: Oropharynx is clear and moist.  Eyes: EOM are normal. Pupils are equal, round, and reactive to light.  Neck: Neck supple.  Cardiovascular: Normal rate, regular rhythm, normal heart sounds and  intact distal pulses.   Pulmonary/Chest: Effort normal and breath sounds normal. No respiratory distress. He has no wheezes. He has no rales. He exhibits tenderness (patient has tenderness to palpation on the right chest wall from about the eighth to the 12th ribs anteriorly and laterally. I don't appreciate crepitus. There is no visual bruising or swelling.).  Abdominal: Soft. Bowel sounds are normal. He exhibits no distension and no mass. There is no tenderness. There is no guarding.  Musculoskeletal: Normal range of motion. He exhibits edema.  The patient has bilateral lower extremity edema approximately 2+. The feet are warm and dry with good peripheral Refill.  Neurological: He is alert and oriented to person, place, and time. He has normal strength. Coordination normal. GCS eye subscore is 4. GCS verbal subscore is 5. GCS motor subscore is 6.  Skin: Skin is warm, dry and intact.  Psychiatric: He has a normal mood and affect.    ED Course  Procedures (including critical care time) Labs Review Labs Reviewed  COMPREHENSIVE METABOLIC PANEL - Abnormal; Notable for the following:    Glucose, Bld 102 (*)    BUN 31 (*)    Creatinine, Ser 1.80 (*)    GFR calc non Af Amer 35 (*)    GFR calc Af Amer 40 (*)    Anion gap 3 (*)    All other components within normal limits  CBC WITH DIFFERENTIAL/PLATELET - Abnormal; Notable for the following:    RBC 3.89 (*)    Hemoglobin 12.9 (*)    MCV 105.1 (*)    RDW 16.9 (*)    Neutrophils Relative % 79 (*)     Lymphocytes Relative 6 (*)    Lymphs Abs 0.3 (*)    Monocytes Relative 13 (*)    All other components within normal limits  URINALYSIS, ROUTINE W REFLEX MICROSCOPIC - Abnormal; Notable for the following:    Hgb urine dipstick SMALL (*)    Protein, ur 100 (*)    All other components within normal limits  LIPASE, BLOOD  TROPONIN I  PROTIME-INR  URINE MICROSCOPIC-ADD ON    Imaging Review Dg Ribs Unilateral W/chest Right  07/17/2014   CLINICAL DATA:  Fall at home on Sunday. Landed on right side on concrete floor. Right anterior rib pain.  EXAM: RIGHT RIBS AND CHEST - 3+ VIEW  COMPARISON:  Chest x-ray 09/2013.  FINDINGS: Prior CABG. There is angulation of the anterior right ninth and tenth ribs compatible with rib fractures. No associated pneumothorax or plural effusion. Minimal right base atelectasis. Heart is normal size.  IMPRESSION: Anterior right ninth and tenth rib fractures.  No pneumothorax.   Electronically Signed   By: Rolm Baptise M.D.   On: 07/17/2014 11:27   US Venous Img Lower Bilateral  07/17/2014   CLINICAL DATA:  [place], pain with BILATERAL lower extremity swelling.  EXAM: BILATERAL LOWER EXTREMITY VENOUS DOPPLER ULTRASOUND  TECHNIQUE: Gray-scale sonography with graded compression, as well as color Doppler and duplex ultrasound were performed to evaluate the lower extremity deep venous systems from the level of the common femoral vein and including the common femoral, femoral, profunda femoral, popliteal and calf veins including the posterior tibial, peroneal and gastrocnemius veins when visible. The superficial great saphenous vein was also interrogated. Spectral Doppler was utilized to evaluate flow at rest and with distal augmentation maneuvers in the common femoral, femoral and popliteal veins.  COMPARISON:  None.  FINDINGS: RIGHT LOWER EXTREMITY  Common Femoral Vein: No evidence of thrombus.  Normal compressibility, respiratory phasicity and response to augmentation.  Saphenofemoral  Junction: No evidence of thrombus. Normal compressibility and flow on color Doppler imaging.  Profunda Femoral Vein: No evidence of thrombus. Normal compressibility and flow on color Doppler imaging.  Femoral Vein: No evidence of thrombus. Normal compressibility, respiratory phasicity and response to augmentation.  Popliteal Vein: No evidence of thrombus. Normal compressibility, respiratory phasicity and response to augmentation.  Calf Veins: No evidence of thrombus. Normal compressibility and flow on color Doppler imaging. Peroneal vein not visualized.  Superficial Great Saphenous Vein: No evidence of thrombus. Normal compressibility and flow on color Doppler imaging.  Venous Reflux:  None.  Other Findings:  None.  LEFT LOWER EXTREMITY  Common Femoral Vein: No evidence of thrombus. Normal compressibility, respiratory phasicity and response to augmentation.  Saphenofemoral Junction: No evidence of thrombus. Normal compressibility and flow on color Doppler imaging.  Profunda Femoral Vein: No evidence of thrombus. Normal compressibility and flow on color Doppler imaging.  Femoral Vein: No evidence of thrombus. Normal compressibility, respiratory phasicity and response to augmentation.  Popliteal Vein: No evidence of thrombus. Normal compressibility, respiratory phasicity and response to augmentation.  Calf Veins: No evidence of thrombus. Normal compressibility and flow on color Doppler imaging. Peroneal vein not visualized.  Superficial Great Saphenous Vein: No evidence of thrombus. Normal compressibility and flow on color Doppler imaging.  Venous Reflux:  None.  Other Findings:  None.  IMPRESSION: No evidence of deep venous thrombosis.   Electronically Signed   By: Rolla Flatten M.D.   On: 07/17/2014 13:05     EKG Interpretation   Date/Time:  Wednesday July 17 2014 11:44:51 EST Ventricular Rate:  68 PR Interval:  182 QRS Duration: 102 QT Interval:  436 QTC Calculation: 463 R Axis:   50 Text  Interpretation:  Normal sinus rhythm Normal ECG agree. no change  Confirmed by Johnney Killian, MD, Jeannie Done [phone]) on 07/17/2014 12:04:36 PM      MDM   Final diagnoses:  Closed rib fracture, right, initial encounter  Peripheral edema   The patient has stable rib fractures which occurred 3 days ago. He does not have dyspnea or cough worse fever to suggest secondary complications of pneumonia. The patient does not have lightheadedness or near syncope or abdominal pain to suggest solid organ injury. His hemoglobins are stable and vital signs are stable. Do not feel that he needed CT abdomen for further evaluation regarding the traumatic episode. The patient reports that due to his rib fractures he has been sitting up a lot more and has developed swelling in his lower legs. Ultrasound does not show any DVT present. At this time I will have the patient take Lasix for 2 evenings and try to elevate his legs when he is at rest. He is counseled to have a follow-up with his nephrologist on Friday to reassess peripheral edema and renal function. The patient is aware of the need to return should he develop any worsening chest pain, cough, dyspnea fever or chills.    Charlesetta Shanks, MD 07/17/14 [phone]

## 2014-07-17 NOTE — ED Notes (Signed)
Pt reports some shortness of breath at night, reports having to sleep sitting up and swelling in his lower extremities.

## 2014-07-17 NOTE — ED Notes (Signed)
MD at bedside. 

## 2014-07-17 NOTE — ED Notes (Signed)
Pt reports fall on Sunday. Sts landing on right ribs, reports he tripped over chainsaw.

## 2014-07-17 NOTE — ED Notes (Signed)
Pt is in U/S at this time.

## 2014-07-17 NOTE — Discharge Instructions (Signed)
Edema Take a dose of Lasix tonight and tomorrow evening. Have a recheck with your nephrologist on Friday to determine if you're to continue taking Lasix.   Edema is an abnormal buildup of fluids in your bodytissues. Edema is somewhatdependent on gravity to pull the fluid to the lowest place in your body. That makes the condition more common in the legs and thighs (lower extremities). Painless swelling of the feet and ankles is common and becomes more likely as you get older. It is also common in looser tissues, like around your eyes.  When the affected area is squeezed, the fluid may move out of that spot and leave a dent for a few moments. This dent is called pitting.  CAUSES  There are many possible causes of edema. Eating too much salt and being on your feet or sitting for a long time can cause edema in your legs and ankles. Hot weather may make edema worse. Common medical causes of edema include:  Heart failure.  Liver disease.  Kidney disease.  Weak blood vessels in your legs.  Cancer.  An injury.  Pregnancy.  Some medications.  Obesity. SYMPTOMS  Edema is usually painless.Your skin may look swollen or shiny.  DIAGNOSIS  Your health care provider may be able to diagnose edema by asking about your medical history and doing a physical exam. You may need to have tests such as X-rays, an electrocardiogram, or blood tests to check for medical conditions that may cause edema.  TREATMENT  Edema treatment depends on the cause. If you have heart, liver, or kidney disease, you need the treatment appropriate for these conditions. General treatment may include:  Elevation of the affected body part above the level of your heart.  Compression of the affected body part. Pressure from elastic bandages or support stockings squeezes the tissues and forces fluid back into the blood vessels. This keeps fluid from entering the tissues.  Restriction of fluid and salt intake.  Use of a  water pill (diuretic). These medications are appropriate only for some types of edema. They pull fluid out of your body and make you urinate more often. This gets rid of fluid and reduces swelling, but diuretics can have side effects. Only use diuretics as directed by your health care provider. HOME CARE INSTRUCTIONS   Keep the affected body part above the level of your heart when you are lying down.   Do not sit still or stand for prolonged periods.   Do not put anything directly under your knees when lying down.  Do not wear constricting clothing or garters on your upper legs.   Exercise your legs to work the fluid back into your blood vessels. This may help the swelling go down.   Wear elastic bandages or support stockings to reduce ankle swelling as directed by your health care provider.   Eat a low-salt diet to reduce fluid if your health care provider recommends it.   Only take medicines as directed by your health care provider. SEEK MEDICAL CARE IF:   Your edema is not responding to treatment.  You have heart, liver, or kidney disease and notice symptoms of edema.  You have edema in your legs that does not improve after elevating them.   You have sudden and unexplained weight gain. SEEK IMMEDIATE MEDICAL CARE IF:   You develop shortness of breath or chest pain.   You cannot breathe when you lie down.  You develop pain, redness, or warmth in the swollen areas.  You have heart, liver, or kidney disease and suddenly get edema.  You have a fever and your symptoms suddenly get worse. MAKE SURE YOU:   Understand these instructions.  Will watch your condition.  Will get help right away if you are not doing well or get worse. Document Released: 05/03/2005 Document Revised: 09/17/2013 Document Reviewed: 02/23/2013 [facility] Patient Information 2015 [place], [place]. This information is not intended to replace advice given to you by your health care provider. Make  sure you discuss any questions you have with your health care provider. Rib Fracture A rib fracture is a break or crack in one of the bones of the ribs. The ribs are a group of long, curved bones that wrap around your chest and attach to your spine. They protect your lungs and other organs in the chest cavity. A broken or cracked rib is often painful, but most do not cause other problems. Most rib fractures heal on their own over time. However, rib fractures can be more serious if multiple ribs are broken or if broken ribs move out of place and push against other structures. CAUSES   A direct blow to the chest. For example, this could happen during contact sports, a car accident, or a fall against a hard object.  Repetitive movements with high force, such as pitching a baseball or having severe coughing spells. SYMPTOMS   Pain when you breathe in or cough.  Pain when someone presses on the injured area. DIAGNOSIS  Your caregiver will perform a physical exam. Various imaging tests may be ordered to confirm the diagnosis and to look for related injuries. These tests may include a chest X-ray, computed tomography (CT), magnetic resonance imaging (MRI), or a bone scan. TREATMENT  Rib fractures usually heal on their own in 1-3 months. The longer healing period is often associated with a continued cough or other aggravating activities. During the healing period, pain control is very important. Medication is usually given to control pain. Hospitalization or surgery may be needed for more severe injuries, such as those in which multiple ribs are broken or the ribs have moved out of place.  HOME CARE INSTRUCTIONS   Avoid strenuous activity and any activities or movements that cause pain. Be careful during activities and avoid bumping the injured rib.  Gradually increase activity as directed by your caregiver.  Only take over-the-counter or prescription medications as directed by your caregiver. Do not  take other medications without asking your caregiver first.  Apply ice to the injured area for the first 1-2 days after you have been treated or as directed by your caregiver. Applying ice helps to reduce inflammation and pain.  Put ice in a plastic bag.  Place a towel between your skin and the bag.   Leave the ice on for 15-20 minutes at a time, every 2 hours while you are awake.  Perform deep breathing as directed by your caregiver. This will help prevent pneumonia, which is a common complication of a broken rib. Your caregiver may instruct you to:  Take deep breaths several times a day.  Try to cough several times a day, holding a pillow against the injured area.  Use a device called an incentive spirometer to practice deep breathing several times a day.  Drink enough fluids to keep your urine clear or pale yellow. This will help you avoid constipation.   Do not wear a rib belt or binder. These restrict breathing, which can lead to pneumonia.  SEEK IMMEDIATE  MEDICAL CARE IF:   You have a fever.   You have difficulty breathing or shortness of breath.   You develop a continual cough, or you cough up thick or bloody sputum.  You feel sick to your stomach (nausea), throw up (vomit), or have abdominal pain.   You have worsening pain not controlled with medications.  MAKE SURE YOU:  Understand these instructions.  Will watch your condition.  Will get help right away if you are not doing well or get worse. Document Released: 05/03/2005 Document Revised: 01/03/2013 Document Reviewed: 07/05/2012 [facility] Patient Information 2015 [place], [place]. This information is not intended to replace advice given to you by your health care provider. Make sure you discuss any questions you have with your health care provider.

## 2014-08-05 NOTE — Progress Notes (Signed)
Patient ID: Luke Mcbride, male   DOB: 12/14/1935, 79 y.o.   MRN: [id number]   Cardiology Office Note    Date:  08/07/2014   ID:  Luke Mcbride, DOB Aug 18, 1935, MRN [id number]  PCP:  Donetta Potts, MD  Cardiologist:  Dr. Jenkins Rouge       History of Present Illness: Luke Mcbride is a 79 y.o. male with a hx of CAD, s/p CABG in 2002, DM2, HTN, HL, CKD, interstitial lung disease, hypothyroidism.  Admitted with NSTEMI in 3/14.  LHC demonstrated severe disease in the S-PDA/PL that was treated with BMS.  LIMA-LAD was atretic but disease in the LAD was moderate. SVG-OM and SVG-diagonal were known to be occluded.  Last seen by me 5/14. He returns for follow up. He has chronic dyspnea with exertion. He is NYHA IIb. He denies significant change. He denies chest pain, orthopnea, PND or edema. He denies syncope.  Seen in ER 07/18/14 after fall with fractured ribs.  LE edema from sitting [place] no DVT told to take lasix for a couple of days  Now improved  No angina needs new nitro    Studies:  - LHC 07/26/12: Proximal LAD 20%, mid LAD 50%, ostial D2 60-70%, ostial CFX 70%, mid CFX occluded, proximal RCA 30% with diffuse 30%, proximal PDA subtotally occluded, atretic LIMA-LAD, occluded SVG-DX and occluded SVG-OM (chronic), SVG-PDA/PL with severe disease. PCI: Vision BMS to the SVG-PDA/PL with distal protection  - Echo 07/27/12: Mild LVH, EF 55%, wall motion could not be assessed, grade 1 diastolic dysfunction, trivial MR, moderate LAE, mild RVE, normal RV function, mild RAE.   Recent Labs/Images:  02/11/2014: HDL-C 36.30*; LDL (calc) 91 07/17/2014: ALT 24; AST 24; BUN 31*; Creatinine 1.80*; Hemoglobin 12.9*; Potassium 4.6; Sodium 135; WBC 5.5   Dg Chest 2 View   12/19/2013    IMPRESSION: Scarring right lower lobe. No edema or consolidation. No apparent adenopathy.   Electronically Signed   By: Lowella Grip M.D.   On: 12/19/2013 11:00     Wt Readings from Last 3 Encounters:  08/07/14 242 lb  (109.77 kg)  07/17/14 235 lb (106.595 kg)  02/08/14 243 lb (110.224 kg)     Past Medical History  Diagnosis Date  . Hypertension   . Hypothyroidism   . ALLERGIC RHINITIS   . CAD (coronary artery disease)     a. 05/2000: NSTEMI/CABG x 6: LIMA->LAD, VG->D1, VG->OM1->2, VG->PDA->RPL;  b. 07/2007 MV: high lat infarct, no ischemia, EF 47%;  c. Cath/PCI: LM nl, LAD20p, 18m, D1 nl, D2 60-70ost, D3 nl, LCX 70ost, 115m, OM1/OM2 min irregs, RCA 30 diff, PDA 99, LIMA->LAD atretic, VG->D1 100, VG->OM1->2 100, VG->PDA->RPL 90p (4.0x23 Vision BMS);  c. 07/2012 Echo: EF 55%, gr1 DD.  [name] Hyperlipemia   . Iritis   . GERD (gastroesophageal reflux disease)   . Barrett's esophagus   . Local infection of skin and subcutaneous tissue   . Recurrent boils   . Neutropenia, drug-induced   . Vasculitis     a.  pauciimmune vasculitis with renal involvement and hx of transient hemoptysis in the past with associated BOOP, 2008 (renal bx 2008: GLN with vasculitis). b. History of treatment with 2 cycles of Cytoxan and pheresis. H/o hemoptysis and pulm hemorrhage with 2nd cycle of cytoxan.  [name] Residual foreign body in soft tissue   . Membranoproliferative nephritis   . Hyperglycemia     Patient reported while on prednisone, had to take insulin  . Anemia   .  ILD (interstitial lung disease)   . BOOP (bronchiolitis obliterans with organizing pneumonia)     a. s/p R VATS 2008.  . CKD (chronic kidney disease), stage III     a. renal bx 2008: GLN with vasculitis  . Glaucoma     a. Cannot see out of L eye.  . Hematuria     Microscopic    Current Outpatient Prescriptions  Medication Sig Dispense Refill  . aspirin EC 81 MG EC tablet Take 1 tablet (81 mg total) by mouth daily. 30 tablet   . azaTHIOprine (IMURAN) 50 MG tablet 2 in the morning, 1 in the afternoon     . clopidogrel (PLAVIX) 75 MG tablet TAKE 1 TABLET (75 MG TOTAL) BY MOUTH DAILY WITH BREAKFAST. 90 tablet 3  . CRESTOR 40 MG tablet TAKE 1 TABLET BY MOUTH ONCE  A DAY 30 tablet 0  . finasteride (PROSCAR) 5 MG tablet Take 5 mg by mouth daily.    . furosemide (LASIX) 40 MG tablet Take 1 tablet (40 mg total) by mouth daily. 10 tablet 0  . levothyroxine (SYNTHROID, LEVOTHROID) 100 MCG tablet Take 100 mcg by mouth daily.      . metoprolol tartrate (LOPRESSOR) 25 MG tablet TAKE 0.5 TABLETS (12.5 MG TOTAL) BY MOUTH 2 (TWO) TIMES DAILY. 90 tablet 3  . Multiple Vitamin (MULTIVITAMIN) tablet Take 1 tablet by mouth daily.      . nitroGLYCERIN (NITROSTAT) 0.4 MG SL tablet Place 1 tablet (0.4 mg total) under the tongue every 5 (five) minutes as needed. May repeat x3 (Patient not taking: Reported on 08/07/2014) 25 tablet 11  . pantoprazole (PROTONIX) 40 MG tablet TAKE 1 TABLET (40 MG TOTAL) BY MOUTH DAILY. 90 tablet 3  . Tamsulosin HCl (FLOMAX) 0.4 MG CAPS Take 0.4 mg by mouth daily.      [name] telmisartan (MICARDIS) 40 MG tablet Take 20 mg by mouth daily. Take 1/2 tab for 20 mg daily.    . traMADol (ULTRAM) 50 MG tablet Take 1 tablet (50 mg total) by mouth every 6 (six) hours as needed. (Patient not taking: Reported on 08/07/2014) 20 tablet 0   No current facility-administered medications for this visit.     Allergies:   Review of patient's allergies indicates no known allergies.   Social History:  The patient  reports that he has never smoked. He has never used smokeless tobacco. He reports that he does not drink alcohol or use illicit drugs.   Family History:  The patient's family history includes Depression in his other; Diabetes in his mother and other; Heart disease in his mother; Prostate cancer in his father and other.   ROS:  Please see the history of present illness.       All other systems reviewed and negative.    PHYSICAL EXAM: VS:  BP 146/72 mmHg  Pulse 89  Ht 6\' 1"  (1.854 m)  Wt 242 lb (109.77 kg)  BMI 31.93 kg/m2 Well nourished, well developed, in no acute distress HEENT: normal Neck:  no JVD Cardiac:  normal S1, S2;  RRR; no murmur Lungs:    clear to auscultation bilaterally, no wheezing, rhonchi or rales Abd: soft, nontender, no hepatomegaly Ext:  no edema Skin: warm and dry Neuro:  CNs 2-12 intact, no focal abnormalities noted  EKG:   07/18/14   NSR, HR 76, normal axis, nonspecific ST-T wave changes      ASSESSMENT AND PLAN:  1. Coronary artery disease: Stable. No angina. Continue aspirin, Plavix, statin,  beta blocker. New nitro called in  2. Essential hypertension, benign: Controlled. 3. HYPERCHOLESTEROLEMIA - Continue statin.  Plan: Hepatic function panel, Lipid Profile 4. DIABETES MELLITUS, TYPE II -  Good control. Plan: HgB A1c (patient request).  5. CKD (chronic kidney disease), stage III:  Continue followup with nephrology.  Disposition:    FU with  me in 6 months.   Jenkins Rouge

## 2014-08-07 ENCOUNTER — Encounter: Payer: Self-pay | Admitting: Cardiovascular Disease

## 2014-08-07 ENCOUNTER — Ambulatory Visit (INDEPENDENT_AMBULATORY_CARE_PROVIDER_SITE_OTHER): Payer: Medicare Other | Admitting: Cardiovascular Disease

## 2014-08-07 VITALS — BP 146/72 | HR 89 | Ht 73.0 in | Wt 242.0 lb

## 2014-08-07 DIAGNOSIS — I2583 Coronary atherosclerosis due to lipid rich plaque: Principal | ICD-10-CM

## 2014-08-07 DIAGNOSIS — I2510 Atherosclerotic heart disease of native coronary artery without angina pectoris: Secondary | ICD-10-CM | POA: Diagnosis not present

## 2014-08-07 MED ORDER — NITROGLYCERIN 0.4 MG SL SUBL: 0.4000 mg | SUBLINGUAL_TABLET | SUBLINGUAL | Status: DC | PRN

## 2014-08-07 NOTE — Patient Instructions (Signed)
Your physician wants you to follow-up in:  6 MONTHS WITH DR NISHAN  You will receive a reminder letter in the mail two months in advance. If you don't receive a letter, please call our office to schedule the follow-up appointment. Your physician recommends that you continue on your current medications as directed. Please refer to the Current Medication list given to you today. 

## 2014-09-12 ENCOUNTER — Encounter: Payer: Self-pay | Admitting: Cardiovascular Disease

## 2014-09-12 DIAGNOSIS — N2581 Secondary hyperparathyroidism of renal origin: Secondary | ICD-10-CM | POA: Diagnosis not present

## 2014-09-12 DIAGNOSIS — N183 Chronic kidney disease, stage 3 (moderate): Secondary | ICD-10-CM | POA: Diagnosis not present

## 2014-09-12 DIAGNOSIS — D638 Anemia in other chronic diseases classified elsewhere: Secondary | ICD-10-CM | POA: Diagnosis not present

## 2014-09-12 DIAGNOSIS — I129 Hypertensive chronic kidney disease with stage 1 through stage 4 chronic kidney disease, or unspecified chronic kidney disease: Secondary | ICD-10-CM | POA: Diagnosis not present

## 2014-10-25 DIAGNOSIS — L570 Actinic keratosis: Secondary | ICD-10-CM | POA: Diagnosis not present

## 2014-10-25 DIAGNOSIS — X32XXXD Exposure to sunlight, subsequent encounter: Secondary | ICD-10-CM | POA: Diagnosis not present

## 2014-10-25 DIAGNOSIS — D0471 Carcinoma in situ of skin of right lower limb, including hip: Secondary | ICD-10-CM | POA: Diagnosis not present

## 2014-10-25 DIAGNOSIS — D225 Melanocytic nevi of trunk: Secondary | ICD-10-CM | POA: Diagnosis not present

## 2014-11-08 DIAGNOSIS — X32XXXD Exposure to sunlight, subsequent encounter: Secondary | ICD-10-CM | POA: Diagnosis not present

## 2014-11-08 DIAGNOSIS — D0439 Carcinoma in situ of skin of other parts of face: Secondary | ICD-10-CM | POA: Diagnosis not present

## 2014-11-08 DIAGNOSIS — Z85828 Personal history of other malignant neoplasm of skin: Secondary | ICD-10-CM | POA: Diagnosis not present

## 2014-11-08 DIAGNOSIS — Z08 Encounter for follow-up examination after completed treatment for malignant neoplasm: Secondary | ICD-10-CM | POA: Diagnosis not present

## 2014-11-08 DIAGNOSIS — L570 Actinic keratosis: Secondary | ICD-10-CM | POA: Diagnosis not present

## 2014-11-11 ENCOUNTER — Other Ambulatory Visit: Payer: Self-pay

## 2014-12-27 DIAGNOSIS — Z85828 Personal history of other malignant neoplasm of skin: Secondary | ICD-10-CM | POA: Diagnosis not present

## 2014-12-27 DIAGNOSIS — X32XXXD Exposure to sunlight, subsequent encounter: Secondary | ICD-10-CM | POA: Diagnosis not present

## 2014-12-27 DIAGNOSIS — Z08 Encounter for follow-up examination after completed treatment for malignant neoplasm: Secondary | ICD-10-CM | POA: Diagnosis not present

## 2014-12-27 DIAGNOSIS — L570 Actinic keratosis: Secondary | ICD-10-CM | POA: Diagnosis not present

## 2014-12-27 DIAGNOSIS — C4402 Squamous cell carcinoma of skin of lip: Secondary | ICD-10-CM | POA: Diagnosis not present

## 2015-01-29 DIAGNOSIS — X32XXXD Exposure to sunlight, subsequent encounter: Secondary | ICD-10-CM | POA: Diagnosis not present

## 2015-01-29 DIAGNOSIS — L570 Actinic keratosis: Secondary | ICD-10-CM | POA: Diagnosis not present

## 2015-02-05 ENCOUNTER — Encounter: Payer: Self-pay | Admitting: *Deleted

## 2015-02-06 ENCOUNTER — Encounter: Payer: Self-pay | Admitting: Cardiovascular Disease

## 2015-02-06 DIAGNOSIS — Z23 Encounter for immunization: Secondary | ICD-10-CM | POA: Diagnosis not present

## 2015-02-06 DIAGNOSIS — E780 Pure hypercholesterolemia: Secondary | ICD-10-CM | POA: Diagnosis not present

## 2015-02-06 DIAGNOSIS — I129 Hypertensive chronic kidney disease with stage 1 through stage 4 chronic kidney disease, or unspecified chronic kidney disease: Secondary | ICD-10-CM | POA: Diagnosis not present

## 2015-02-06 DIAGNOSIS — E1129 Type 2 diabetes mellitus with other diabetic kidney complication: Secondary | ICD-10-CM | POA: Diagnosis not present

## 2015-02-06 DIAGNOSIS — N183 Chronic kidney disease, stage 3 (moderate): Secondary | ICD-10-CM | POA: Diagnosis not present

## 2015-02-06 DIAGNOSIS — D638 Anemia in other chronic diseases classified elsewhere: Secondary | ICD-10-CM | POA: Diagnosis not present

## 2015-02-09 NOTE — Progress Notes (Signed)
Patient ID: Luke Mcbride, male   DOB: [birth year], 79 y.o.   MRN: [id number]   Cardiology Office Note    Date:  02/10/2015   ID:  Luke Mcbride, DOB 11/25/1935, MRN [id number]  PCP:  Donetta Potts, MD  Cardiologist:  Dr. Jenkins Rouge       History of Present Illness: Luke Mcbride is a 79 y.o. male with a hx of CAD, s/p CABG in 2002, DM2, HTN, HL, CKD, interstitial lung disease, hypothyroidism.  Admitted with NSTEMI in 3/14.  LHC demonstrated severe disease in the S-PDA/PL that was treated with BMS.  LIMA-LAD was atretic but disease in the LAD was moderate. SVG-OM and SVG-diagonal were known to be occluded.   He has chronic dyspnea with exertion. He is NYHA IIb. He denies significant change. He denies chest pain, orthopnea, PND or edema. He denies syncope.  Seen in ER 07/18/14 after fall with fractured ribs.  LE edema from sitting [place] no DVT told to take lasix for a couple of days  Now improved  No angina needs new nitro    Studies:  - LHC 07/26/12: Proximal LAD 20%, mid LAD 50%, ostial D2 60-70%, ostial CFX 70%, mid CFX occluded, proximal RCA 30% with diffuse 30%, proximal PDA subtotally occluded, atretic LIMA-LAD, occluded SVG-DX and occluded SVG-OM (chronic), SVG-PDA/PL with severe disease. PCI: Vision BMS to the SVG-PDA/PL with distal protection  - Echo 07/27/12: Mild LVH, EF 55%, wall motion could not be assessed, grade 1 diastolic dysfunction, trivial MR, moderate LAE, mild RVE, normal RV function, mild RAE.   Recent Labs/Images:  02/11/2014: HDL 36.30*; LDL Cholesterol 91 07/17/2014: ALT 24; AST 24; BUN 31*; Creatinine, Ser 1.80*; Hemoglobin 12.9*; Potassium 4.6; Sodium 135; WBC 5.5   Dg Chest 2 View   12/19/2013    IMPRESSION: Scarring right lower lobe. No edema or consolidation. No apparent adenopathy.   Electronically Signed   By: Lowella Grip M.D.   On: 12/19/2013 11:00     Wt Readings from Last 3 Encounters:  02/10/15 113.127 kg (249 lb 6.4 oz)  08/07/14 109.77 kg  (242 lb)  07/17/14 106.595 kg (235 lb)     Past Medical History  Diagnosis Date  . Hypertension   . Hypothyroidism   . ALLERGIC RHINITIS   . CAD (coronary artery disease)     a. 05/2000: NSTEMI/CABG x 6: LIMA->LAD, VG->D1, VG->OM1->2, VG->PDA->RPL;  b. 07/2007 MV: high lat infarct, no ischemia, EF 47%;  c. Cath/PCI: LM nl, LAD20p, 63m, D1 nl, D2 60-70ost, D3 nl, LCX 70ost, 126m, OM1/OM2 min irregs, RCA 30 diff, PDA 99, LIMA->LAD atretic, VG->D1 100, VG->OM1->2 100, VG->PDA->RPL 90p (4.0x23 Vision BMS);  c. 07/2012 Echo: EF 55%, gr1 DD.  [name] Hyperlipemia   . Iritis   . GERD (gastroesophageal reflux disease)   . Barrett's esophagus   . Local infection of skin and subcutaneous tissue   . Recurrent boils   . Neutropenia, drug-induced   . Vasculitis     a.  pauciimmune vasculitis with renal involvement and hx of transient hemoptysis in the past with associated BOOP, 2008 (renal bx 2008: GLN with vasculitis). b. History of treatment with 2 cycles of Cytoxan and pheresis. H/o hemoptysis and pulm hemorrhage with 2nd cycle of cytoxan.  [name] Residual foreign body in soft tissue   . Membranoproliferative nephritis   . Hyperglycemia     Patient reported while on prednisone, had to take insulin  . Anemia   . ILD (interstitial lung disease)   .  BOOP (bronchiolitis obliterans with organizing pneumonia)     a. s/p R VATS 2008.  . CKD (chronic kidney disease), stage III     a. renal bx 2008: GLN with vasculitis  . Glaucoma     a. Cannot see out of L eye.  . Hematuria     Microscopic    Current Outpatient Prescriptions  Medication Sig Dispense Refill  . aspirin EC 81 MG EC tablet Take 1 tablet (81 mg total) by mouth daily. 30 tablet   . azaTHIOprine (IMURAN) 50 MG tablet TAKE 2 TABLETS(100MG ) BY MOUTH IN THE AM AND 1 TABLET(50MG ) BY MOUTH IN THE PM    . clopidogrel (PLAVIX) 75 MG tablet TAKE 1 TABLET (75 MG TOTAL) BY MOUTH DAILY WITH BREAKFAST. 90 tablet 3  . CRESTOR 40 MG tablet TAKE 1 TABLET BY  MOUTH ONCE A DAY 30 tablet 0  . finasteride (PROSCAR) 5 MG tablet Take 5 mg by mouth daily.    [name] levothyroxine (SYNTHROID, LEVOTHROID) 100 MCG tablet Take 100 mcg by mouth daily.      . metoprolol tartrate (LOPRESSOR) 25 MG tablet TAKE 0.5 TABLETS (12.5 MG TOTAL) BY MOUTH 2 (TWO) TIMES DAILY. 90 tablet 3  . Multiple Vitamin (MULTIVITAMIN) tablet Take 1 tablet by mouth daily.      . nitroGLYCERIN (NITROSTAT) 0.4 MG SL tablet Place 1 tablet (0.4 mg total) under the tongue every 5 (five) minutes as needed. May repeat x3 25 tablet 4  . pantoprazole (PROTONIX) 40 MG tablet TAKE 1 TABLET (40 MG TOTAL) BY MOUTH DAILY. 90 tablet 3  . Tamsulosin HCl (FLOMAX) 0.4 MG CAPS Take 0.4 mg by mouth daily.      [name] telmisartan (MICARDIS) 40 MG tablet Take 20 mg by mouth daily.      No current facility-administered medications for this visit.     Allergies:   Review of patient's allergies indicates no known allergies.   Social History:  The patient  reports that he has never smoked. He has never used smokeless tobacco. He reports that he does not drink alcohol or use illicit drugs.   Family History:  The patient's family history includes Depression in his other; Diabetes in his mother and other; Heart disease in his mother; Prostate cancer in his father and other.   ROS:  Please see the history of present illness.       All other systems reviewed and negative.    PHYSICAL EXAM: VS:  Ht 6\' 1"  (1.854 m)  Wt 113.127 kg (249 lb 6.4 oz)  BMI 32.91 kg/m2 Well nourished, well developed, in no acute distress HEENT: normal Neck:  no JVD Cardiac:  normal S1, S2;  RRR; no murmur Lungs:   clear to auscultation bilaterally, no wheezing, rhonchi or rales Abd: soft, nontender, no hepatomegaly Ext:  no edema Skin: warm and dry Neuro:  CNs 2-12 intact, no focal abnormalities noted  EKG:   07/18/14   NSR, HR 76, normal axis, nonspecific ST-T wave changes      ASSESSMENT AND PLAN:  1. Coronary artery disease:  Stable. No angina. Continue aspirin, Plavix, statin, beta blocker.  2. Essential hypertension, benign: Controlled. 3. HYPERCHOLESTEROLEMIA - Continue statin.  Plan: Hepatic function panel, Lipid Profile 4. DIABETES MELLITUS, TYPE II -  Good control. Plan: HgB A1c (patient request).  5. CKD (chronic kidney disease), stage III:  Continue followup with nephrology.  Disposition:    FU with  me in 6 months.   Jenkins Rouge

## 2015-02-10 ENCOUNTER — Encounter: Payer: Self-pay | Admitting: Cardiovascular Disease

## 2015-02-10 ENCOUNTER — Ambulatory Visit (INDEPENDENT_AMBULATORY_CARE_PROVIDER_SITE_OTHER): Payer: Medicare Other | Admitting: Cardiovascular Disease

## 2015-02-10 VITALS — BP 135/70 | HR 68 | Resp 11 | Ht 73.0 in | Wt 249.4 lb

## 2015-02-10 DIAGNOSIS — I2510 Atherosclerotic heart disease of native coronary artery without angina pectoris: Secondary | ICD-10-CM | POA: Diagnosis not present

## 2015-02-10 DIAGNOSIS — I10 Essential (primary) hypertension: Secondary | ICD-10-CM

## 2015-02-10 DIAGNOSIS — I2583 Coronary atherosclerosis due to lipid rich plaque: Principal | ICD-10-CM

## 2015-02-10 NOTE — Patient Instructions (Signed)
Medication Instructions:  Your physician recommends that you continue on your current medications as directed. Please refer to the Current Medication list given to you today.   Labwork: NONE  Testing/Procedures: NONE  Follow-Up: Your physician wants you to follow-up in: 6 MONTHS  WITH  DR NISHAN  You will receive a reminder letter in the mail two months in advance. If you don't receive a letter, please call our office to schedule the follow-up appointment.  Any Other Special Instructions Will Be Listed Below (If Applicable).   

## 2015-02-13 ENCOUNTER — Other Ambulatory Visit: Payer: Self-pay | Admitting: Cardiovascular Disease

## 2015-02-25 ENCOUNTER — Other Ambulatory Visit: Payer: Self-pay | Admitting: Cardiovascular Disease

## 2015-02-26 ENCOUNTER — Other Ambulatory Visit: Payer: Self-pay

## 2015-02-26 DIAGNOSIS — D0439 Carcinoma in situ of skin of other parts of face: Secondary | ICD-10-CM | POA: Diagnosis not present

## 2015-02-26 DIAGNOSIS — L568 Other specified acute skin changes due to ultraviolet radiation: Secondary | ICD-10-CM | POA: Diagnosis not present

## 2015-02-26 DIAGNOSIS — L570 Actinic keratosis: Secondary | ICD-10-CM | POA: Diagnosis not present

## 2015-02-26 DIAGNOSIS — X32XXXD Exposure to sunlight, subsequent encounter: Secondary | ICD-10-CM | POA: Diagnosis not present

## 2015-02-26 MED ORDER — PANTOPRAZOLE SODIUM 40 MG PO TBEC: DELAYED_RELEASE_TABLET | ORAL | Status: DC

## 2015-03-26 DIAGNOSIS — L570 Actinic keratosis: Secondary | ICD-10-CM | POA: Diagnosis not present

## 2015-03-26 DIAGNOSIS — C44310 Basal cell carcinoma of skin of unspecified parts of face: Secondary | ICD-10-CM | POA: Diagnosis not present

## 2015-03-26 DIAGNOSIS — B079 Viral wart, unspecified: Secondary | ICD-10-CM | POA: Diagnosis not present

## 2015-03-26 DIAGNOSIS — X32XXXD Exposure to sunlight, subsequent encounter: Secondary | ICD-10-CM | POA: Diagnosis not present

## 2015-03-31 ENCOUNTER — Encounter: Payer: Self-pay | Admitting: Pulmonary Disease

## 2015-03-31 ENCOUNTER — Ambulatory Visit (INDEPENDENT_AMBULATORY_CARE_PROVIDER_SITE_OTHER): Payer: Medicare Other | Admitting: Pulmonary Disease

## 2015-03-31 ENCOUNTER — Ambulatory Visit (INDEPENDENT_AMBULATORY_CARE_PROVIDER_SITE_OTHER)
Admission: RE | Admit: 2015-03-31 | Discharge: 2015-03-31 | Disposition: A | Discharge status: Home / Self Care | Payer: Medicare Other | Source: Ambulatory Visit | Attending: Pulmonary Disease | Admitting: Pulmonary Disease

## 2015-03-31 VITALS — BP 132/76 | HR 78 | Ht 73.0 in | Wt 250.0 lb

## 2015-03-31 DIAGNOSIS — J8410 Pulmonary fibrosis, unspecified: Secondary | ICD-10-CM

## 2015-03-31 DIAGNOSIS — I2510 Atherosclerotic heart disease of native coronary artery without angina pectoris: Secondary | ICD-10-CM | POA: Diagnosis not present

## 2015-03-31 DIAGNOSIS — Z23 Encounter for immunization: Secondary | ICD-10-CM

## 2015-03-31 NOTE — Patient Instructions (Signed)
Keep taking the medications as directed by your kidney doctor Let us know if you have problems breathing, cough that you cannot explain, coughing up blood, chest pain, or weight loss We will see you back in one year or sooner if needed

## 2015-03-31 NOTE — Progress Notes (Signed)
Subjective:    Patient ID: Luke Mcbride, male    DOB: May 19, 1935, 79 y.o.   MRN: [id number]  Synopsis: Luke Mcbride has been followed in the [facility] since 2015 for vasculitis associated organizing pneumonia. His disease has been stable since his biopsy-proven diagnosis in 2009. Hx BOOP s/p OLBx 2009 Renal vasculitis, no evidence of pulmonary vasculitis on OLBx but presumed  HPI Chief Complaint  Patient presents with  . Follow-up    1 year rov.  c/o DOE, states this is stable and baseline for him.  no new complaints.     Luke Mcbride has been doing about the same.  No change in dyspnea.  He still has dyspnea on exertion like when he "tries to do any hard work" like walking up hill.  No cough, chest pain, no fever, no hemoptysis.  No doctors visits for a respiratory problem in the last years.  He still sees Dr. Verneita Griffes and remains on imuran.  Past Medical History  Diagnosis Date  . Hypertension   . Hypothyroidism   . ALLERGIC RHINITIS   . CAD (coronary artery disease)     a. 05/2000: NSTEMI/CABG x 6: LIMA->LAD, VG->D1, VG->OM1->2, VG->PDA->RPL;  b. 07/2007 MV: high lat infarct, no ischemia, EF 47%;  c. Cath/PCI: LM nl, LAD20p, 37m, D1 nl, D2 60-70ost, D3 nl, LCX 70ost, 143m, OM1/OM2 min irregs, RCA 30 diff, PDA 99, LIMA->LAD atretic, VG->D1 100, VG->OM1->2 100, VG->PDA->RPL 90p (4.0x23 Vision BMS);  c. 07/2012 Echo: EF 55%, gr1 DD.  [name] Hyperlipemia   . Iritis   . GERD (gastroesophageal reflux disease)   . Barrett's esophagus   . Local infection of skin and subcutaneous tissue   . Recurrent boils   . Neutropenia, drug-induced ([place])   . Vasculitis ([place])     a.  pauciimmune vasculitis with renal involvement and hx of transient hemoptysis in the past with associated BOOP, 2008 (renal bx 2008: GLN with vasculitis). b. History of treatment with 2 cycles of Cytoxan and pheresis. H/o hemoptysis and pulm hemorrhage with 2nd cycle of cytoxan.  [name] Residual foreign body in soft  tissue   . Membranoproliferative nephritis   . Hyperglycemia     Patient reported while on prednisone, had to take insulin  . Anemia   . ILD (interstitial lung disease) ([place])   . BOOP (bronchiolitis obliterans with organizing pneumonia) ([place])     a. s/p R VATS 2008.  . CKD (chronic kidney disease), stage III     a. renal bx 2008: GLN with vasculitis  . Glaucoma     a. Cannot see out of L eye.  . Hematuria     Microscopic      Review of Systems     Objective:   Physical Exam Filed Vitals:   03/31/15 0906  BP: 132/76  Pulse: 78  Height: 6\' 1"  (1.854 m)  Weight: 250 lb (113.399 kg)  SpO2: 96%   RA  Gen: well appearing HENT: OP clear, TM's clear, neck supple PULM: CTA B, normal percussion CV: RRR, no mgr, trace edema GI: BS+, soft, nontender Derm: no cyanosis or rash Psyche: normal mood and affect        Assessment & Plan:  INTERSTITIAL LUNG DISEASE This has been a stable interval for Luke Mcbride. He has had no clinical evidence of recurrence of his organizing pneumonia in the last year. His lungs are clear today on exam.  Plan: Prevnar vaccine today Continue Imuran as directed by nephrology Chest x-ray  today to ensure there is no evidence of recurrence of his disease Follow-up in one year or sooner if indicated     Current outpatient prescriptions:  .  aspirin EC 81 MG EC tablet, Take 1 tablet (81 mg total) by mouth daily., Disp: 30 tablet, Rfl:  .  azaTHIOprine (IMURAN) 50 MG tablet, TAKE 2 TABLETS(100MG ) BY MOUTH IN THE AM AND 1 TABLET(50MG ) BY MOUTH IN THE PM, Disp: , Rfl:  .  clopidogrel (PLAVIX) 75 MG tablet, TAKE 1 TABLET (75 MG TOTAL) BY MOUTH DAILY WITH BREAKFAST., Disp: 90 tablet, Rfl: 1 .  CRESTOR 40 MG tablet, TAKE 1 TABLET BY MOUTH ONCE A DAY, Disp: 30 tablet, Rfl: 0 .  finasteride (PROSCAR) 5 MG tablet, Take 5 mg by mouth daily., Disp: , Rfl:  .  levothyroxine (SYNTHROID, LEVOTHROID) 100 MCG tablet, Take 100 mcg by mouth daily.  , Disp: , Rfl:   .  metoprolol tartrate (LOPRESSOR) 25 MG tablet, TAKE 0.5 TABLETS (12.5 MG TOTAL) BY MOUTH 2 (TWO) TIMES DAILY., Disp: 90 tablet, Rfl: 1 .  Multiple Vitamin (MULTIVITAMIN) tablet, Take 1 tablet by mouth daily.  , Disp: , Rfl:  .  nitroGLYCERIN (NITROSTAT) 0.4 MG SL tablet, Place 1 tablet (0.4 mg total) under the tongue every 5 (five) minutes as needed. May repeat x3, Disp: 25 tablet, Rfl: 4 .  pantoprazole (PROTONIX) 40 MG tablet, TAKE 1 TABLET (40 MG TOTAL) BY MOUTH DAILY., Disp: 90 tablet, Rfl: 3 .  Tamsulosin HCl (FLOMAX) 0.4 MG CAPS, Take 0.4 mg by mouth daily.  , Disp: , Rfl:  .  telmisartan (MICARDIS) 40 MG tablet, Take 20 mg by mouth daily. , Disp: , Rfl:

## 2015-03-31 NOTE — Assessment & Plan Note (Signed)
This has been a stable interval for [name]. He has had no clinical evidence of recurrence of his organizing pneumonia in the last year. His lungs are clear today on exam.  Plan: Prevnar vaccine today Continue Imuran as directed by nephrology Chest x-ray today to ensure there is no evidence of recurrence of his disease Follow-up in one year or sooner if indicated

## 2015-03-31 NOTE — Addendum Note (Signed)
Addended by: Jerrol Banana on: 03/31/2015 09:30 AM   Modules accepted: Orders

## 2015-04-02 NOTE — Progress Notes (Signed)
Quick Note:  Spoke with pt and notified of results per Dr. McQuaid. Pt verbalized understanding and denied any questions.  ______ 

## 2015-04-23 DIAGNOSIS — C4441 Basal cell carcinoma of skin of scalp and neck: Secondary | ICD-10-CM | POA: Diagnosis not present

## 2015-06-30 DIAGNOSIS — M25551 Pain in right hip: Secondary | ICD-10-CM | POA: Diagnosis not present

## 2015-06-30 DIAGNOSIS — M25552 Pain in left hip: Secondary | ICD-10-CM | POA: Diagnosis not present

## 2015-06-30 DIAGNOSIS — M4807 Spinal stenosis, lumbosacral region: Secondary | ICD-10-CM | POA: Diagnosis not present

## 2015-07-08 DIAGNOSIS — M4800 Spinal stenosis, site unspecified: Secondary | ICD-10-CM | POA: Insufficient documentation

## 2015-07-18 ENCOUNTER — Encounter: Payer: Self-pay | Admitting: Cardiovascular Disease

## 2015-07-18 DIAGNOSIS — J42 Unspecified chronic bronchitis: Secondary | ICD-10-CM | POA: Diagnosis not present

## 2015-07-18 DIAGNOSIS — N013 Rapidly progressive nephritic syndrome with diffuse mesangial proliferative glomerulonephritis: Secondary | ICD-10-CM | POA: Diagnosis not present

## 2015-07-18 DIAGNOSIS — R809 Proteinuria, unspecified: Secondary | ICD-10-CM | POA: Diagnosis not present

## 2015-07-18 DIAGNOSIS — E1129 Type 2 diabetes mellitus with other diabetic kidney complication: Secondary | ICD-10-CM | POA: Diagnosis not present

## 2015-07-18 DIAGNOSIS — E039 Hypothyroidism, unspecified: Secondary | ICD-10-CM | POA: Diagnosis not present

## 2015-07-18 DIAGNOSIS — I2510 Atherosclerotic heart disease of native coronary artery without angina pectoris: Secondary | ICD-10-CM | POA: Diagnosis not present

## 2015-07-18 DIAGNOSIS — N183 Chronic kidney disease, stage 3 (moderate): Secondary | ICD-10-CM | POA: Diagnosis not present

## 2015-07-18 DIAGNOSIS — N2581 Secondary hyperparathyroidism of renal origin: Secondary | ICD-10-CM | POA: Diagnosis not present

## 2015-07-18 DIAGNOSIS — I129 Hypertensive chronic kidney disease with stage 1 through stage 4 chronic kidney disease, or unspecified chronic kidney disease: Secondary | ICD-10-CM | POA: Diagnosis not present

## 2015-07-18 DIAGNOSIS — E669 Obesity, unspecified: Secondary | ICD-10-CM | POA: Diagnosis not present

## 2015-07-18 DIAGNOSIS — E7800 Pure hypercholesterolemia, unspecified: Secondary | ICD-10-CM | POA: Diagnosis not present

## 2015-07-18 DIAGNOSIS — N400 Enlarged prostate without lower urinary tract symptoms: Secondary | ICD-10-CM | POA: Diagnosis not present

## 2015-07-18 DIAGNOSIS — D638 Anemia in other chronic diseases classified elsewhere: Secondary | ICD-10-CM | POA: Diagnosis not present

## 2015-07-22 DIAGNOSIS — X32XXXD Exposure to sunlight, subsequent encounter: Secondary | ICD-10-CM | POA: Diagnosis not present

## 2015-07-22 DIAGNOSIS — D0471 Carcinoma in situ of skin of right lower limb, including hip: Secondary | ICD-10-CM | POA: Diagnosis not present

## 2015-07-22 DIAGNOSIS — D0462 Carcinoma in situ of skin of left upper limb, including shoulder: Secondary | ICD-10-CM | POA: Diagnosis not present

## 2015-07-22 DIAGNOSIS — L570 Actinic keratosis: Secondary | ICD-10-CM | POA: Diagnosis not present

## 2015-08-05 NOTE — Progress Notes (Signed)
Patient ID: Luke Mcbride, male   DOB: Jun 11, 1935, 80 y.o.   MRN: [id number]   Cardiology Office Note    Date:  08/11/2015   ID:  Luke Mcbride, DOB 12-14-1935, MRN [id number]  PCP:  Donetta Potts, MD  Cardiologist:  Dr. Jenkins Rouge       History of Present Illness: Luke Mcbride is a 80 y.o. male with a hx of CAD, s/p CABG in 2002, DM2, HTN, HL, CKD, interstitial lung disease, hypothyroidism.  Admitted with NSTEMI in 3/14.  LHC demonstrated severe disease in the S-PDA/PL that was treated with BMS.  LIMA-LAD was atretic but disease in the LAD was moderate. SVG-OM and SVG-diagonal were known to be occluded.   He has chronic dyspnea with exertion. He is NYHA IIb. He denies significant change. He denies chest pain, orthopnea, PND or edema. He denies syncope.  Seen in ER 07/18/14 after fall with fractured ribs.  LE edema from sitting [place] no DVT told to take lasix for a couple of days  Now improved  No angina needs new nitro   More LE edema  Had "lesion" removed by Dr [place] Crane LLE and not healing well  Sees [place] for renal On Imuran but not [place]  Studies:  - LHC 07/26/12: Proximal LAD 20%, mid LAD 50%, ostial D2 60-70%, ostial CFX 70%, mid CFX occluded, proximal RCA 30% with diffuse 30%, proximal PDA subtotally occluded, atretic LIMA-LAD, occluded SVG-DX and occluded SVG-OM (chronic), SVG-PDA/PL with severe disease. PCI: Vision BMS to the SVG-PDA/PL with distal protection  - Echo 07/27/12: Mild LVH, EF 55%, wall motion could not be assessed, grade 1 diastolic dysfunction, trivial MR, moderate LAE, mild RVE, normal RV function, mild RAE.   Recent Labs/Images:  No results found for requested labs within last 365 days.   Dg Chest 2 View   12/19/2013    IMPRESSION: Scarring right lower lobe. No edema or consolidation. No apparent adenopathy.   Electronically Signed   By: Lowella Grip M.D.   On: 12/19/2013 11:00     Wt Readings from Last 3 Encounters:  08/11/15 115.268 kg (254 lb  1.9 oz)  03/31/15 113.399 kg (250 lb)  02/10/15 113.127 kg (249 lb 6.4 oz)     Past Medical History  Diagnosis Date  . Hypertension   . Hypothyroidism   . ALLERGIC RHINITIS   . CAD (coronary artery disease)     a. 05/2000: NSTEMI/CABG x 6: LIMA->LAD, VG->D1, VG->OM1->2, VG->PDA->RPL;  b. 07/2007 MV: high lat infarct, no ischemia, EF 47%;  c. Cath/PCI: LM nl, LAD20p, 98m, D1 nl, D2 60-70ost, D3 nl, LCX 70ost, 155m, OM1/OM2 min irregs, RCA 30 diff, PDA 99, LIMA->LAD atretic, VG->D1 100, VG->OM1->2 100, VG->PDA->RPL 90p (4.0x23 Vision BMS);  c. 07/2012 Echo: EF 55%, gr1 DD.  [name] Hyperlipemia   . Iritis   . GERD (gastroesophageal reflux disease)   . Barrett's esophagus   . Local infection of skin and subcutaneous tissue   . Recurrent boils   . Neutropenia, drug-induced ([place])   . Vasculitis ([place])     a.  pauciimmune vasculitis with renal involvement and hx of transient hemoptysis in the past with associated BOOP, 2008 (renal bx 2008: GLN with vasculitis). b. History of treatment with 2 cycles of Cytoxan and pheresis. H/o hemoptysis and pulm hemorrhage with 2nd cycle of cytoxan.  [name] Residual foreign body in soft tissue   . Membranoproliferative nephritis   . Hyperglycemia     Patient reported while on prednisone,  had to take insulin  . Anemia   . ILD (interstitial lung disease) ([place])   . BOOP (bronchiolitis obliterans with organizing pneumonia) ([place])     a. s/p R VATS 2008.  . CKD (chronic kidney disease), stage III     a. renal bx 2008: GLN with vasculitis  . Glaucoma     a. Cannot see out of L eye.  . Hematuria     Microscopic    Current Outpatient Prescriptions  Medication Sig Dispense Refill  . aspirin EC 81 MG EC tablet Take 1 tablet (81 mg total) by mouth daily. 30 tablet   . azaTHIOprine (IMURAN) 50 MG tablet TAKE 2 TABLETS(100MG ) BY MOUTH IN THE AM AND 1 TABLET(50MG ) BY MOUTH IN THE PM    . clopidogrel (PLAVIX) 75 MG tablet TAKE 1 TABLET (75 MG TOTAL) BY MOUTH DAILY WITH  BREAKFAST. 90 tablet 1  . CRESTOR 40 MG tablet TAKE 1 TABLET BY MOUTH ONCE A DAY 30 tablet 0  . finasteride (PROSCAR) 5 MG tablet Take 5 mg by mouth daily.    [name] levothyroxine (SYNTHROID, LEVOTHROID) 100 MCG tablet Take 100 mcg by mouth daily.      . metoprolol tartrate (LOPRESSOR) 25 MG tablet TAKE 0.5 TABLETS (12.5 MG TOTAL) BY MOUTH 2 (TWO) TIMES DAILY. 90 tablet 1  . Multiple Vitamin (MULTIVITAMIN) tablet Take 1 tablet by mouth daily.      . nitroGLYCERIN (NITROSTAT) 0.4 MG SL tablet Place 1 tablet (0.4 mg total) under the tongue every 5 (five) minutes as needed. May repeat x3 25 tablet 4  . pantoprazole (PROTONIX) 40 MG tablet TAKE 1 TABLET (40 MG TOTAL) BY MOUTH DAILY. 90 tablet 3  . Tamsulosin HCl (FLOMAX) 0.4 MG CAPS Take 0.4 mg by mouth daily.      [name] telmisartan (MICARDIS) 40 MG tablet Take 20 mg by mouth daily.     . furosemide (LASIX) 20 MG tablet Take 1 tablet (20 mg total) by mouth daily. 90 tablet 3   No current facility-administered medications for this visit.     Allergies:   Review of patient's allergies indicates no known allergies.   Social History:  The patient  reports that he has never smoked. He has never used smokeless tobacco. He reports that he does not drink alcohol or use illicit drugs.   Family History:  The patient's family history includes Depression in his other; Diabetes in his mother and other; Heart disease in his mother; Prostate cancer in his father and other.   ROS:  Please see the history of present illness.       All other systems reviewed and negative.    PHYSICAL EXAM: VS:  BP 130/80 mmHg  Pulse 80  Ht 6\' 1"  (1.854 m)  Wt 115.268 kg (254 lb 1.9 oz)  BMI 33.53 kg/m2  SpO2 95% Well nourished, well developed, in no acute distress HEENT: normal Neck:  no JVD Cardiac:  normal S1, S2;  RRR; no murmur Lungs:   clear to auscultation bilaterally, no wheezing, rhonchi or rales Abd: soft, nontender, no hepatomegaly Ext:  no edema Skin: warm and  dry Neuro:  CNs 2-12 intact, no focal abnormalities noted  EKG:   07/18/14   NSR, HR 76, normal axis, nonspecific ST-T wave changes   08/11/15  SR arte 96 old IMI    ASSESSMENT AND PLAN:  1. Coronary artery disease:  CABG 2002  BMS to SVG to PDA  2014  Stable. No angina. Continue aspirin, Plavix,  statin, beta blocker.  2. Essential hypertension, benign: Controlled. 3. HYPERCHOLESTEROLEMIA - Continue statin.  Plan: Hepatic function panel, Lipid Profile 4. DIABETES MELLITUS, TYPE II -  Good control. A1c 5.9   5. CKD (chronic kidney disease), stage III:  Continue followup with nephrology. Baseline Cr about 1.8 6. Edema:  Start Lasix 20 mg had labs with Dr Azzie Roup renal 2 weeks ago.   7. Derm:  Post biopsy/incision LLE not healing well told him to see Dr hall this week Diuretic and controlling edema should help  Disposition:    FU with  Me next available    Jenkins Rouge

## 2015-08-11 ENCOUNTER — Encounter: Payer: Self-pay | Admitting: Cardiovascular Disease

## 2015-08-11 ENCOUNTER — Ambulatory Visit (INDEPENDENT_AMBULATORY_CARE_PROVIDER_SITE_OTHER): Payer: Medicare Other | Admitting: Cardiovascular Disease

## 2015-08-11 VITALS — BP 130/80 | HR 80 | Ht 73.0 in | Wt 254.1 lb

## 2015-08-11 DIAGNOSIS — I2510 Atherosclerotic heart disease of native coronary artery without angina pectoris: Secondary | ICD-10-CM | POA: Diagnosis not present

## 2015-08-11 DIAGNOSIS — I2583 Coronary atherosclerosis due to lipid rich plaque: Principal | ICD-10-CM

## 2015-08-11 MED ORDER — FUROSEMIDE 20 MG PO TABS: 20.0000 mg | ORAL_TABLET | Freq: Every day | ORAL | Status: DC

## 2015-08-11 NOTE — Patient Instructions (Addendum)
Medication Instructions:  Your physician has recommended you make the following change in your medication:  1-START lasix 20 mg by mouth daily.  Labwork: NONE  Testing/Procedures: NONE  Follow-Up: Your physician wants you to follow-up next available with Dr. [name].  If you need a refill on your cardiac medications before your next appointment, please call your pharmacy.

## 2015-08-13 DIAGNOSIS — X32XXXD Exposure to sunlight, subsequent encounter: Secondary | ICD-10-CM | POA: Diagnosis not present

## 2015-08-13 DIAGNOSIS — Z08 Encounter for follow-up examination after completed treatment for malignant neoplasm: Secondary | ICD-10-CM | POA: Diagnosis not present

## 2015-08-13 DIAGNOSIS — L570 Actinic keratosis: Secondary | ICD-10-CM | POA: Diagnosis not present

## 2015-08-13 DIAGNOSIS — Z85828 Personal history of other malignant neoplasm of skin: Secondary | ICD-10-CM | POA: Diagnosis not present

## 2015-08-18 ENCOUNTER — Other Ambulatory Visit: Payer: Self-pay | Admitting: Cardiovascular Disease

## 2015-08-23 ENCOUNTER — Other Ambulatory Visit: Payer: Self-pay | Admitting: Cardiovascular Disease

## 2015-08-27 DIAGNOSIS — L570 Actinic keratosis: Secondary | ICD-10-CM | POA: Diagnosis not present

## 2015-08-27 DIAGNOSIS — X32XXXD Exposure to sunlight, subsequent encounter: Secondary | ICD-10-CM | POA: Diagnosis not present

## 2015-08-27 DIAGNOSIS — Z85828 Personal history of other malignant neoplasm of skin: Secondary | ICD-10-CM | POA: Diagnosis not present

## 2015-08-27 DIAGNOSIS — Z08 Encounter for follow-up examination after completed treatment for malignant neoplasm: Secondary | ICD-10-CM | POA: Diagnosis not present

## 2015-09-12 ENCOUNTER — Encounter: Payer: Self-pay | Admitting: Cardiovascular Disease

## 2015-09-12 ENCOUNTER — Other Ambulatory Visit: Payer: Self-pay | Admitting: Orthopaedic Surgery

## 2015-09-12 DIAGNOSIS — M4807 Spinal stenosis, lumbosacral region: Secondary | ICD-10-CM

## 2015-09-22 ENCOUNTER — Other Ambulatory Visit: Payer: Self-pay | Admitting: Orthopaedic Surgery

## 2015-09-22 DIAGNOSIS — M5440 Lumbago with sciatica, unspecified side: Secondary | ICD-10-CM

## 2015-09-24 ENCOUNTER — Encounter: Payer: Self-pay | Admitting: Gastroenterology

## 2015-09-28 ENCOUNTER — Ambulatory Visit
Admission: RE | Admit: 2015-09-28 | Discharge: 2015-09-28 | Disposition: A | Discharge status: Home / Self Care | Payer: Medicare Other | Source: Ambulatory Visit | Attending: Orthopaedic Surgery | Admitting: Orthopaedic Surgery

## 2015-09-28 DIAGNOSIS — M5440 Lumbago with sciatica, unspecified side: Secondary | ICD-10-CM

## 2015-09-28 DIAGNOSIS — M4806 Spinal stenosis, lumbar region: Secondary | ICD-10-CM | POA: Diagnosis not present

## 2015-09-30 ENCOUNTER — Ambulatory Visit
Admission: RE | Admit: 2015-09-30 | Discharge: 2015-09-30 | Disposition: A | Discharge status: Home / Self Care | Payer: Medicare Other | Source: Ambulatory Visit | Attending: Orthopaedic Surgery | Admitting: Orthopaedic Surgery

## 2015-09-30 DIAGNOSIS — M545 Low back pain: Secondary | ICD-10-CM | POA: Diagnosis not present

## 2015-09-30 DIAGNOSIS — M4807 Spinal stenosis, lumbosacral region: Secondary | ICD-10-CM

## 2015-09-30 MED ORDER — IOPAMIDOL (ISOVUE-M 200) INJECTION 41%
1.0000 mL | Freq: Once | INTRAMUSCULAR | Status: AC
  Administered 2015-09-30: 1 mL via EPIDURAL

## 2015-09-30 MED ORDER — METHYLPREDNISOLONE ACETATE 40 MG/ML INJ SUSP (RADIOLOG
120.0000 mg | Freq: Once | INTRAMUSCULAR | Status: AC
  Administered 2015-09-30: 120 mg via EPIDURAL

## 2015-09-30 NOTE — Discharge Instructions (Signed)

## 2015-10-06 NOTE — Progress Notes (Signed)
Patient ID: Luke Mcbride, male   DOB: [birth year], 80 y.o.   MRN: [id number]   Cardiology Office Note    Date:  10/08/2015   ID:  Luke Mcbride, DOB [birth year], MRN [id number]  PCP:  Donetta Potts, MD  Cardiologist:  Dr. Jenkins Rouge       History of Present Illness: Luke Mcbride is a 80 y.o. male with a hx of CAD, s/p CABG in 2002, DM2, HTN, HL, CKD, interstitial lung disease, hypothyroidism.  Admitted with NSTEMI in 3/14.  LHC demonstrated severe disease in the S-PDA/PL that was treated with BMS.  LIMA-LAD was atretic but disease in the LAD was moderate. SVG-OM and SVG-diagonal were known to be occluded.   He has chronic dyspnea with exertion. He is NYHA IIb. He denies significant change. He denies chest pain, orthopnea, PND or edema. He denies syncope.  Seen in ER 07/18/14 after fall with fractured ribs.  LE edema from sitting [place] no DVT told to take lasix for a couple of days  Now improved  No angina needs new nitro   More LE edema  Had "lesion" removed by Dr [place] Crane LLE  March   Started on diuretic for edema then   Sees [place] for renal On Imuran but not [place]  Bad back. Got epidural this month with some improvement   Studies:  - LHC 07/26/12: Proximal LAD 20%, mid LAD 50%, ostial D2 60-70%, ostial CFX 70%, mid CFX occluded, proximal RCA 30% with diffuse 30%, proximal PDA subtotally occluded, atretic LIMA-LAD, occluded SVG-DX and occluded SVG-OM (chronic), SVG-PDA/PL with severe disease. PCI: Vision BMS to the SVG-PDA/PL with distal protection  - Echo 07/27/12: Mild LVH, EF 55%, wall motion could not be assessed, grade 1 diastolic dysfunction, trivial MR, moderate LAE, mild RVE, normal RV function, mild RAE.   Recent Labs/Images:  No results found for requested labs within last 365 days.   Dg Chest 2 View   12/19/2013    IMPRESSION: Scarring right lower lobe. No edema or consolidation. No apparent adenopathy.   Electronically Signed   By: Lowella Grip M.D.   On:  12/19/2013 11:00     Wt Readings from Last 3 Encounters:  10/08/15 113.907 kg (251 lb 1.9 oz)  08/11/15 115.268 kg (254 lb 1.9 oz)  03/31/15 113.399 kg (250 lb)     Past Medical History  Diagnosis Date  . Hypertension   . Hypothyroidism   . ALLERGIC RHINITIS   . CAD (coronary artery disease)     a. 05/2000: NSTEMI/CABG x 6: LIMA->LAD, VG->D1, VG->OM1->2, VG->PDA->RPL;  b. 07/2007 MV: high lat infarct, no ischemia, EF 47%;  c. Cath/PCI: LM nl, LAD20p, 23m, D1 nl, D2 60-70ost, D3 nl, LCX 70ost, 113m, OM1/OM2 min irregs, RCA 30 diff, PDA 99, LIMA->LAD atretic, VG->D1 100, VG->OM1->2 100, VG->PDA->RPL 90p (4.0x23 Vision BMS);  c. 07/2012 Echo: EF 55%, gr1 DD.  [name] Hyperlipemia   . Iritis   . GERD (gastroesophageal reflux disease)   . Barrett's esophagus   . Local infection of skin and subcutaneous tissue   . Recurrent boils   . Neutropenia, drug-induced ([place])   . Vasculitis ([place])     a.  pauciimmune vasculitis with renal involvement and hx of transient hemoptysis in the past with associated BOOP, 2008 (renal bx 2008: GLN with vasculitis). b. History of treatment with 2 cycles of Cytoxan and pheresis. H/o hemoptysis and pulm hemorrhage with 2nd cycle of cytoxan.  [name] Residual foreign body in soft tissue   .  Membranoproliferative nephritis   . Hyperglycemia     Patient reported while on prednisone, had to take insulin  . Anemia   . ILD (interstitial lung disease) ([place])   . BOOP (bronchiolitis obliterans with organizing pneumonia) ([place])     a. s/p R VATS 2008.  . CKD (chronic kidney disease), stage III     a. renal bx 2008: GLN with vasculitis  . Glaucoma     a. Cannot see out of L eye.  . Hematuria     Microscopic    Current Outpatient Prescriptions  Medication Sig Dispense Refill  . aspirin EC 81 MG EC tablet Take 1 tablet (81 mg total) by mouth daily. 30 tablet   . azaTHIOprine (IMURAN) 50 MG tablet TAKE 2 TABLETS(100MG ) BY MOUTH IN THE AM AND 1 TABLET(50MG ) BY MOUTH IN THE PM    .  clopidogrel (PLAVIX) 75 MG tablet TAKE 1 TABLET (75 MG TOTAL) BY MOUTH DAILY WITH BREAKFAST. 90 tablet 3  . finasteride (PROSCAR) 5 MG tablet Take 5 mg by mouth daily.    . furosemide (LASIX) 20 MG tablet Take 1 tablet (20 mg total) by mouth daily. 90 tablet 3  . levothyroxine (SYNTHROID, LEVOTHROID) 100 MCG tablet Take 100 mcg by mouth daily.      . metoprolol tartrate (LOPRESSOR) 25 MG tablet TAKE 0.5 TABLETS (12.5 MG TOTAL) BY MOUTH 2 (TWO) TIMES DAILY. 90 tablet 0  . Multiple Vitamin (MULTIVITAMIN) tablet Take 1 tablet by mouth daily.      . nitroGLYCERIN (NITROSTAT) 0.4 MG SL tablet Place 1 tablet (0.4 mg total) under the tongue every 5 (five) minutes as needed. May repeat x3 25 tablet 4  . pantoprazole (PROTONIX) 40 MG tablet TAKE 1 TABLET (40 MG TOTAL) BY MOUTH DAILY. 90 tablet 3  . rosuvastatin (CRESTOR) 10 MG tablet Take 10 mg by mouth daily.    . Tamsulosin HCl (FLOMAX) 0.4 MG CAPS Take 0.4 mg by mouth daily.      [name] telmisartan (MICARDIS) 40 MG tablet Take 20 mg by mouth daily.      No current facility-administered medications for this visit.     Allergies:   Review of patient's allergies indicates no known allergies.   Social History:  The patient  reports that he has never smoked. He has never used smokeless tobacco. He reports that he does not drink alcohol or use illicit drugs.   Family History:  The patient's family history includes Depression in his other; Diabetes in his mother and other; Heart disease in his mother; Prostate cancer in his father and other.   ROS:  Please see the history of present illness.       All other systems reviewed and negative.    PHYSICAL EXAM: VS:  BP 140/86 mmHg  Pulse 82  Ht 6\' 1"  (1.854 m)  Wt 113.907 kg (251 lb 1.9 oz)  BMI 33.14 kg/m2  SpO2 96% Well nourished, well developed, in no acute distress HEENT: normal Neck:  no JVD Cardiac:  normal S1, S2;  RRR; no murmur Lungs:   clear to auscultation bilaterally, no wheezing, rhonchi or  rales Abd: soft, nontender, no hepatomegaly Ext:  no edema Skin: warm and dry black eschar on right calf slow to heal  Neuro:  CNs 2-12 intact, no focal abnormalities noted  EKG:   07/18/14   NSR, HR 76, normal axis, nonspecific ST-T wave changes   08/11/15  SR arte 45 old IMI    ASSESSMENT AND PLAN:  1. Coronary artery disease:  CABG 2002  BMS to SVG to PDA  2014  Stable. No angina. Continue aspirin, Plavix, statin, beta blocker.  2. Essential hypertension, benign: Controlled. 3. HYPERCHOLESTEROLEMIA - Continue statin.  Plan: Hepatic function panel, Lipid Profile 4. DIABETES MELLITUS, TYPE II -  Good control. A1c 5.9   5. CKD (chronic kidney disease), stage III:  Continue followup with nephrology. Baseline Cr about 2.13 on lab review from [facility]  6. Edema:  Start Lasix 20 mg had labs with Dr Azzie Roup renal 2 weeks ago.   7. Derm:  Post biopsy/incision LLE  Slow to heal ? Due to Imuran 8. Back pain improved with epidural f/u neuro Discussed situps and core exercises   Disposition:    FU with  Me    Jenkins Rouge

## 2015-10-08 ENCOUNTER — Ambulatory Visit (INDEPENDENT_AMBULATORY_CARE_PROVIDER_SITE_OTHER): Payer: Medicare Other | Admitting: Cardiovascular Disease

## 2015-10-08 ENCOUNTER — Encounter: Payer: Self-pay | Admitting: Cardiovascular Disease

## 2015-10-08 VITALS — BP 140/86 | HR 82 | Ht 73.0 in | Wt 251.1 lb

## 2015-10-08 DIAGNOSIS — I2510 Atherosclerotic heart disease of native coronary artery without angina pectoris: Secondary | ICD-10-CM | POA: Diagnosis not present

## 2015-10-08 DIAGNOSIS — I2583 Coronary atherosclerosis due to lipid rich plaque: Principal | ICD-10-CM

## 2015-10-08 NOTE — Patient Instructions (Signed)

## 2015-10-16 ENCOUNTER — Telehealth: Payer: Self-pay | Admitting: Cardiovascular Disease

## 2015-10-16 NOTE — Telephone Encounter (Signed)
Records received from [facility], placed in chart prep bin.

## 2015-10-24 ENCOUNTER — Telehealth: Payer: Self-pay | Admitting: Cardiovascular Disease

## 2015-10-24 NOTE — Telephone Encounter (Signed)
Records rec via fax from [facility] placed in chart prep bin.

## 2015-11-17 ENCOUNTER — Other Ambulatory Visit: Payer: Self-pay | Admitting: *Deleted

## 2015-11-17 MED ORDER — METOPROLOL TARTRATE 25 MG PO TABS: 12.5000 mg | ORAL_TABLET | Freq: Two times a day (BID) | ORAL | Status: DC

## 2015-11-19 ENCOUNTER — Other Ambulatory Visit: Payer: Self-pay | Admitting: Cardiovascular Disease

## 2015-12-10 DIAGNOSIS — Z85828 Personal history of other malignant neoplasm of skin: Secondary | ICD-10-CM | POA: Diagnosis not present

## 2015-12-10 DIAGNOSIS — Z08 Encounter for follow-up examination after completed treatment for malignant neoplasm: Secondary | ICD-10-CM | POA: Diagnosis not present

## 2015-12-10 DIAGNOSIS — D0472 Carcinoma in situ of skin of left lower limb, including hip: Secondary | ICD-10-CM | POA: Diagnosis not present

## 2015-12-10 DIAGNOSIS — Z1283 Encounter for screening for malignant neoplasm of skin: Secondary | ICD-10-CM | POA: Diagnosis not present

## 2015-12-10 DIAGNOSIS — D225 Melanocytic nevi of trunk: Secondary | ICD-10-CM | POA: Diagnosis not present

## 2015-12-10 DIAGNOSIS — D045 Carcinoma in situ of skin of trunk: Secondary | ICD-10-CM | POA: Diagnosis not present

## 2016-01-07 DIAGNOSIS — L570 Actinic keratosis: Secondary | ICD-10-CM | POA: Diagnosis not present

## 2016-01-07 DIAGNOSIS — X32XXXD Exposure to sunlight, subsequent encounter: Secondary | ICD-10-CM | POA: Diagnosis not present

## 2016-01-07 DIAGNOSIS — Z85828 Personal history of other malignant neoplasm of skin: Secondary | ICD-10-CM | POA: Diagnosis not present

## 2016-01-07 DIAGNOSIS — C44319 Basal cell carcinoma of skin of other parts of face: Secondary | ICD-10-CM | POA: Diagnosis not present

## 2016-01-07 DIAGNOSIS — Z08 Encounter for follow-up examination after completed treatment for malignant neoplasm: Secondary | ICD-10-CM | POA: Diagnosis not present

## 2016-01-27 DIAGNOSIS — Z7982 Long term (current) use of aspirin: Secondary | ICD-10-CM | POA: Diagnosis not present

## 2016-01-27 DIAGNOSIS — R112 Nausea with vomiting, unspecified: Secondary | ICD-10-CM | POA: Diagnosis not present

## 2016-01-27 DIAGNOSIS — I2510 Atherosclerotic heart disease of native coronary artery without angina pectoris: Secondary | ICD-10-CM | POA: Diagnosis not present

## 2016-01-27 DIAGNOSIS — Z79899 Other long term (current) drug therapy: Secondary | ICD-10-CM | POA: Diagnosis not present

## 2016-01-27 DIAGNOSIS — R140 Abdominal distension (gaseous): Secondary | ICD-10-CM | POA: Diagnosis not present

## 2016-01-27 DIAGNOSIS — R109 Unspecified abdominal pain: Secondary | ICD-10-CM | POA: Diagnosis not present

## 2016-01-27 DIAGNOSIS — Z7902 Long term (current) use of antithrombotics/antiplatelets: Secondary | ICD-10-CM | POA: Diagnosis not present

## 2016-01-27 DIAGNOSIS — R1084 Generalized abdominal pain: Secondary | ICD-10-CM | POA: Diagnosis not present

## 2016-01-27 DIAGNOSIS — K529 Noninfective gastroenteritis and colitis, unspecified: Secondary | ICD-10-CM | POA: Diagnosis not present

## 2016-02-03 DIAGNOSIS — H5213 Myopia, bilateral: Secondary | ICD-10-CM | POA: Diagnosis not present

## 2016-02-03 DIAGNOSIS — Z9842 Cataract extraction status, left eye: Secondary | ICD-10-CM | POA: Diagnosis not present

## 2016-02-03 DIAGNOSIS — H44512 Absolute glaucoma, left eye: Secondary | ICD-10-CM | POA: Diagnosis not present

## 2016-02-03 DIAGNOSIS — Z9841 Cataract extraction status, right eye: Secondary | ICD-10-CM | POA: Diagnosis not present

## 2016-02-03 DIAGNOSIS — H40021 Open angle with borderline findings, high risk, right eye: Secondary | ICD-10-CM | POA: Diagnosis not present

## 2016-02-05 DIAGNOSIS — E039 Hypothyroidism, unspecified: Secondary | ICD-10-CM | POA: Diagnosis not present

## 2016-02-05 DIAGNOSIS — J42 Unspecified chronic bronchitis: Secondary | ICD-10-CM | POA: Diagnosis not present

## 2016-02-05 DIAGNOSIS — D638 Anemia in other chronic diseases classified elsewhere: Secondary | ICD-10-CM | POA: Diagnosis not present

## 2016-02-05 DIAGNOSIS — E669 Obesity, unspecified: Secondary | ICD-10-CM | POA: Diagnosis not present

## 2016-02-05 DIAGNOSIS — R809 Proteinuria, unspecified: Secondary | ICD-10-CM | POA: Diagnosis not present

## 2016-02-05 DIAGNOSIS — N400 Enlarged prostate without lower urinary tract symptoms: Secondary | ICD-10-CM | POA: Diagnosis not present

## 2016-02-05 DIAGNOSIS — N183 Chronic kidney disease, stage 3 (moderate): Secondary | ICD-10-CM | POA: Diagnosis not present

## 2016-02-05 DIAGNOSIS — E7800 Pure hypercholesterolemia, unspecified: Secondary | ICD-10-CM | POA: Diagnosis not present

## 2016-02-05 DIAGNOSIS — E1129 Type 2 diabetes mellitus with other diabetic kidney complication: Secondary | ICD-10-CM | POA: Diagnosis not present

## 2016-02-05 DIAGNOSIS — N2581 Secondary hyperparathyroidism of renal origin: Secondary | ICD-10-CM | POA: Diagnosis not present

## 2016-02-05 DIAGNOSIS — I2510 Atherosclerotic heart disease of native coronary artery without angina pectoris: Secondary | ICD-10-CM | POA: Diagnosis not present

## 2016-02-05 DIAGNOSIS — I129 Hypertensive chronic kidney disease with stage 1 through stage 4 chronic kidney disease, or unspecified chronic kidney disease: Secondary | ICD-10-CM | POA: Diagnosis not present

## 2016-02-09 ENCOUNTER — Other Ambulatory Visit: Payer: Self-pay | Admitting: Cardiovascular Disease

## 2016-02-09 MED ORDER — CLOPIDOGREL BISULFATE 75 MG PO TABS: 75.0000 mg | ORAL_TABLET | Freq: Every day | ORAL | 2 refills | Status: DC

## 2016-02-09 MED ORDER — METOPROLOL TARTRATE 25 MG PO TABS: 12.5000 mg | ORAL_TABLET | Freq: Two times a day (BID) | ORAL | 2 refills | Status: DC

## 2016-02-09 MED ORDER — FUROSEMIDE 20 MG PO TABS
20.0000 mg | ORAL_TABLET | Freq: Every day | ORAL | 2 refills | Status: DC
Start: 2016-02-09 — End: 2016-07-29

## 2016-02-09 MED ORDER — PANTOPRAZOLE SODIUM 40 MG PO TBEC: DELAYED_RELEASE_TABLET | ORAL | 2 refills | Status: DC

## 2016-02-10 ENCOUNTER — Other Ambulatory Visit: Payer: Self-pay | Admitting: Cardiovascular Disease

## 2016-02-15 ENCOUNTER — Other Ambulatory Visit: Payer: Self-pay | Admitting: Cardiovascular Disease

## 2016-02-26 ENCOUNTER — Other Ambulatory Visit: Payer: Self-pay | Admitting: Orthopaedic Surgery

## 2016-02-26 DIAGNOSIS — M4807 Spinal stenosis, lumbosacral region: Secondary | ICD-10-CM

## 2016-03-02 ENCOUNTER — Other Ambulatory Visit: Payer: Medicare Other

## 2016-03-03 DIAGNOSIS — H40021 Open angle with borderline findings, high risk, right eye: Secondary | ICD-10-CM | POA: Diagnosis not present

## 2016-03-03 DIAGNOSIS — H5442A4 Blindness left eye category 4, normal vision right eye: Secondary | ICD-10-CM | POA: Diagnosis not present

## 2016-03-03 DIAGNOSIS — H44512 Absolute glaucoma, left eye: Secondary | ICD-10-CM | POA: Diagnosis not present

## 2016-03-03 NOTE — Progress Notes (Signed)
Patient ID: BREVEN GUIDROZ, male   DOB: 12-31-1935, 80 y.o.   MRN: [id number]   Cardiology Office Note    Date:  03/05/2016   ID:  Luke Mcbride, DOB [birth year], MRN [id number]  PCP:  Donetta Potts, MD  Cardiologist:  Dr. Jenkins Rouge       History of Present Illness: Luke Mcbride is a 80 y.o. male with a hx of CAD, s/p CABG in 2002, DM2, HTN, HL, CKD, interstitial lung disease, hypothyroidism.  Admitted with NSTEMI in 3/14.  LHC demonstrated severe disease in the S-PDA/PL that was treated with BMS.  LIMA-LAD was atretic but disease in the LAD was moderate. SVG-OM and SVG-diagonal were known to be occluded.   He has chronic dyspnea with exertion. He is NYHA IIb. He denies significant change. He denies chest pain, orthopnea, PND or edema. He denies syncope.  Seen in ER 07/18/14 after fall with fractured ribs.  LE edema from sitting [place] no DVT told to take lasix for a couple of days  Now improved  No angina needs new nitro   More LE edema  Had "lesion" removed by Dr [place] Crane LLE  March   Started on diuretic for edema then   Sees [place] for renal On Imuran but not [place]  Bad back. Epidural injection yesterday was off plavix for 5 days   Studies:  - LHC 07/26/12: Proximal LAD 20%, mid LAD 50%, ostial D2 60-70%, ostial CFX 70%, mid CFX occluded, proximal RCA 30% with diffuse 30%, proximal PDA subtotally occluded, atretic LIMA-LAD, occluded SVG-DX and occluded SVG-OM (chronic), SVG-PDA/PL with severe disease. PCI: Vision BMS to the SVG-PDA/PL with distal protection  - Echo 07/27/12: Mild LVH, EF 55%, wall motion could not be assessed, grade 1 diastolic dysfunction, trivial MR, moderate LAE, mild RVE, normal RV function, mild RAE.   Recent Labs/Images:  No results found for requested labs within last 8760 hours.   Dg Chest 2 View   12/19/2013    IMPRESSION: Scarring right lower lobe. No edema or consolidation. No apparent adenopathy.   Electronically Signed   By: Lowella Grip M.D.    On: 12/19/2013 11:00     Wt Readings from Last 3 Encounters:  03/05/16 110.2 kg (243 lb)  10/08/15 113.9 kg (251 lb 1.9 oz)  08/11/15 115.3 kg (254 lb 1.9 oz)     Past Medical History:  Diagnosis Date  . ALLERGIC RHINITIS   . Anemia   . Barrett's esophagus   . BOOP (bronchiolitis obliterans with organizing pneumonia) ([place])    a. s/p R VATS 2008.  [name] CAD (coronary artery disease)    a. 05/2000: NSTEMI/CABG x 6: LIMA->LAD, VG->D1, VG->OM1->2, VG->PDA->RPL;  b. 07/2007 MV: high lat infarct, no ischemia, EF 47%;  c. Cath/PCI: LM nl, LAD20p, 20m, D1 nl, D2 60-70ost, D3 nl, LCX 70ost, 123m, OM1/OM2 min irregs, RCA 30 diff, PDA 99, LIMA->LAD atretic, VG->D1 100, VG->OM1->2 100, VG->PDA->RPL 90p (4.0x23 Vision BMS);  c. 07/2012 Echo: EF 55%, gr1 DD.  [name] CKD (chronic kidney disease), stage III    a. renal bx 2008: GLN with vasculitis  . GERD (gastroesophageal reflux disease)   . Glaucoma    a. Cannot see out of L eye.  . Hematuria    Microscopic  . Hyperglycemia    Patient reported while on prednisone, had to take insulin  . Hyperlipemia   . Hypertension   . Hypothyroidism   . ILD (interstitial lung disease) ([place])   . Iritis   .  Local infection of skin and subcutaneous tissue   . Membranoproliferative nephritis   . Neutropenia, drug-induced ([place])   . Recurrent boils   . Residual foreign body in soft tissue   . Vasculitis ([place])    a.  pauciimmune vasculitis with renal involvement and hx of transient hemoptysis in the past with associated BOOP, 2008 (renal bx 2008: GLN with vasculitis). b. History of treatment with 2 cycles of Cytoxan and pheresis. H/o hemoptysis and pulm hemorrhage with 2nd cycle of cytoxan.    Current Outpatient Prescriptions  Medication Sig Dispense Refill  . aspirin EC 81 MG EC tablet Take 1 tablet (81 mg total) by mouth daily. 30 tablet   . azaTHIOprine (IMURAN) 50 MG tablet TAKE 2 TABLETS(100MG ) BY MOUTH IN THE AM AND 1 TABLET(50MG ) BY MOUTH IN THE PM    .  clopidogrel (PLAVIX) 75 MG tablet Take 1 tablet (75 mg total) by mouth daily. 90 tablet 2  . finasteride (PROSCAR) 5 MG tablet Take 5 mg by mouth daily.    . furosemide (LASIX) 20 MG tablet Take 1 tablet (20 mg total) by mouth daily. 90 tablet 2  . levothyroxine (SYNTHROID, LEVOTHROID) 100 MCG tablet Take 100 mcg by mouth daily.      . metoprolol tartrate (LOPRESSOR) 25 MG tablet Take 0.5 tablets (12.5 mg total) by mouth 2 (two) times daily. 90 tablet 2  . Multiple Vitamin (MULTIVITAMIN) tablet Take 1 tablet by mouth daily.      [name] NITROSTAT 0.4 MG SL tablet PLACE 1 TABLET (0.4 MG TOTAL) UNDER THE TONGUE EVERY 5 (FIVE) MINUTES AS NEEDED. MAY REPEAT X3 25 tablet 2  . pantoprazole (PROTONIX) 40 MG tablet TAKE 1 TABLET (40 MG TOTAL) BY MOUTH DAILY. 90 tablet 2  . rosuvastatin (CRESTOR) 10 MG tablet Take 10 mg by mouth daily.    . Tamsulosin HCl (FLOMAX) 0.4 MG CAPS Take 0.4 mg by mouth daily.      [name] telmisartan (MICARDIS) 40 MG tablet Take 20 mg by mouth daily.      No current facility-administered medications for this visit.      Allergies:   Review of patient's allergies indicates no known allergies.   Social History:  The patient  reports that he has never smoked. He has never used smokeless tobacco. He reports that he does not drink alcohol or use drugs.   Family History:  The patient's family history includes Depression in his other; Diabetes in his mother and other; Heart disease in his mother; Prostate cancer in his father and other.   ROS:  Please see the history of present illness.       All other systems reviewed and negative.    PHYSICAL EXAM: VS:  BP 116/60   Pulse 66   Ht 6\' 1"  (1.854 m)   Wt 110.2 kg (243 lb)   SpO2 93%   BMI 32.06 kg/m  Well nourished, well developed, in no acute distress  HEENT: normal  Neck:  no JVD  Cardiac:  normal S1, S2;  RRR; no murmur  Lungs:   clear to auscultation bilaterally, no wheezing, rhonchi or rales  Abd: soft, nontender, no  hepatomegaly  Ext:  no edema  Skin: warm and dry  black eschar on right calf slow to heal  Neuro:  CNs 2-12 intact, no focal abnormalities noted  EKG:   07/18/14   NSR, HR 76, normal axis, nonspecific ST-T wave changes   08/11/15  SR arte 90 old IMI  ASSESSMENT AND PLAN:  1. Coronary artery disease:  CABG 2002  BMS to SVG to PDA  2014  Stable. No angina. Continue aspirin, Plavix, statin, beta blocker.  2. Essential hypertension, benign: Controlled. 3. HYPERCHOLESTEROLEMIA - Continue statin.  Plan: Hepatic function panel, Lipid Profile 4. DIABETES MELLITUS, TYPE II -  Good control. A1c 5.9   5. CKD (chronic kidney disease), stage III:  Continue followup with nephrology. Baseline Cr about 2.13 on lab review from [place] [facility]  6. Edema:  Start Lasix 20 mg had labs with Dr Azzie Roup renal 2 weeks ago.   7. Derm:  Post biopsy/incision LLE  Slow to heal ? Due to Imuran 8. Back pain improved with epidural f/u neuro Discussed situps and core exercises MRI 09/28/15 with L23 disc Bulge and L5S1 anterolisthesis   9.  ILD:  CXR reviewed 03/31/15 NAD consider f/u hi res non contrast CT and PFTls     Disposition:    FU with  Me in a year    Jenkins Rouge

## 2016-03-04 ENCOUNTER — Ambulatory Visit
Admission: RE | Admit: 2016-03-04 | Discharge: 2016-03-04 | Disposition: A | Discharge status: Home / Self Care | Payer: Medicare Other | Source: Ambulatory Visit | Attending: Orthopaedic Surgery | Admitting: Orthopaedic Surgery

## 2016-03-04 DIAGNOSIS — M545 Low back pain: Secondary | ICD-10-CM | POA: Diagnosis not present

## 2016-03-04 DIAGNOSIS — M4807 Spinal stenosis, lumbosacral region: Secondary | ICD-10-CM

## 2016-03-04 MED ORDER — METHYLPREDNISOLONE ACETATE 40 MG/ML INJ SUSP (RADIOLOG
120.0000 mg | Freq: Once | INTRAMUSCULAR | Status: AC
  Administered 2016-03-04: 120 mg via EPIDURAL

## 2016-03-04 MED ORDER — IOPAMIDOL (ISOVUE-M 200) INJECTION 41%
1.0000 mL | Freq: Once | INTRAMUSCULAR | Status: AC
  Administered 2016-03-04: 1 mL via EPIDURAL

## 2016-03-04 NOTE — Discharge Instructions (Signed)

## 2016-03-05 ENCOUNTER — Ambulatory Visit (INDEPENDENT_AMBULATORY_CARE_PROVIDER_SITE_OTHER): Payer: Medicare Other | Admitting: Cardiovascular Disease

## 2016-03-05 ENCOUNTER — Encounter: Payer: Self-pay | Admitting: Cardiovascular Disease

## 2016-03-05 VITALS — BP 116/60 | HR 66 | Ht 73.0 in | Wt 243.0 lb

## 2016-03-05 DIAGNOSIS — I2510 Atherosclerotic heart disease of native coronary artery without angina pectoris: Secondary | ICD-10-CM | POA: Diagnosis not present

## 2016-03-05 DIAGNOSIS — I2583 Coronary atherosclerosis due to lipid rich plaque: Secondary | ICD-10-CM | POA: Diagnosis not present

## 2016-03-05 NOTE — Patient Instructions (Signed)

## 2016-03-09 DIAGNOSIS — L570 Actinic keratosis: Secondary | ICD-10-CM | POA: Diagnosis not present

## 2016-03-09 DIAGNOSIS — Z08 Encounter for follow-up examination after completed treatment for malignant neoplasm: Secondary | ICD-10-CM | POA: Diagnosis not present

## 2016-03-09 DIAGNOSIS — Z85828 Personal history of other malignant neoplasm of skin: Secondary | ICD-10-CM | POA: Diagnosis not present

## 2016-03-09 DIAGNOSIS — X32XXXD Exposure to sunlight, subsequent encounter: Secondary | ICD-10-CM | POA: Diagnosis not present

## 2016-03-09 DIAGNOSIS — D0461 Carcinoma in situ of skin of right upper limb, including shoulder: Secondary | ICD-10-CM | POA: Diagnosis not present

## 2016-03-10 ENCOUNTER — Ambulatory Visit (INDEPENDENT_AMBULATORY_CARE_PROVIDER_SITE_OTHER): Payer: Medicare Other | Admitting: Nurse Practitioner

## 2016-03-10 ENCOUNTER — Encounter: Payer: Self-pay | Admitting: Nurse Practitioner

## 2016-03-10 VITALS — BP 132/80 | HR 76 | Ht 70.5 in | Wt 244.1 lb

## 2016-03-10 DIAGNOSIS — Z1211 Encounter for screening for malignant neoplasm of colon: Secondary | ICD-10-CM

## 2016-03-10 DIAGNOSIS — K5909 Other constipation: Secondary | ICD-10-CM

## 2016-03-10 DIAGNOSIS — K2270 Barrett's esophagus without dysplasia: Secondary | ICD-10-CM

## 2016-03-10 DIAGNOSIS — Z7901 Long term (current) use of anticoagulants: Secondary | ICD-10-CM

## 2016-03-10 DIAGNOSIS — I2583 Coronary atherosclerosis due to lipid rich plaque: Secondary | ICD-10-CM | POA: Diagnosis not present

## 2016-03-10 DIAGNOSIS — I2510 Atherosclerotic heart disease of native coronary artery without angina pectoris: Secondary | ICD-10-CM | POA: Diagnosis not present

## 2016-03-10 NOTE — Progress Notes (Signed)
HPI: Patient is a 80 year old male, formerly followed by Dr. Deatra Ina for history of GERD/Barrett's esophagus (EGD with biopsy 2009). Patient has multiple medical problems not limited to history of vasculitis, HTN, CKD 3, coronary artery disease status post stent placement, and thyroid disease. He is referred by Dr. Marval Regal for colon cancer screening.  Patient complains of constipation since taking antibiotics for a skin infection. Stool frequency unchanged but has stools difficultly expelling stool which results in straining. No rectal bleeding or rectal pain. Patient has never had a colonoscopy  In mid September, while visiting [place] patient developed acute abdominal pain. Went to ED, CTscan showed abnormal appearing small bowel within LLQ. There was concern for mesenteric venous gas / ischemia. Evaluated by Surgery. WBC was normal, pain eventually resolved. Surgery felt patient was stable from discharge. No abdominal pain since. Appetite is fine.   Past Medical History:  Diagnosis Date  . ALLERGIC RHINITIS   . Anemia   . Barrett's esophagus   . BOOP (bronchiolitis obliterans with organizing pneumonia) ([place])    a. s/p R VATS 2008.  [name] CAD (coronary artery disease)    a. 05/2000: NSTEMI/CABG x 6: LIMA->LAD, VG->D1, VG->OM1->2, VG->PDA->RPL;  b. 07/2007 MV: high lat infarct, no ischemia, EF 47%;  c. Cath/PCI: LM nl, LAD20p, 69m, D1 nl, D2 60-70ost, D3 nl, LCX 70ost, 158m, OM1/OM2 min irregs, RCA 30 diff, PDA 99, LIMA->LAD atretic, VG->D1 100, VG->OM1->2 100, VG->PDA->RPL 90p (4.0x23 Vision BMS);  c. 07/2012 Echo: EF 55%, gr1 DD.  [name] CKD (chronic kidney disease), stage III    a. renal bx 2008: GLN with vasculitis  . GERD (gastroesophageal reflux disease)   . Glaucoma    a. Cannot see out of L eye.  . Hematuria    Microscopic  . Hyperglycemia    Patient reported while on prednisone, had to take insulin  . Hyperlipemia   . Hypertension   . Hypothyroidism   . ILD (interstitial lung  disease) ([place])   . Iritis   . Local infection of skin and subcutaneous tissue   . Membranoproliferative nephritis   . Neutropenia, drug-induced ([place])   . Recurrent boils   . Residual foreign body in soft tissue   . Vasculitis ([place])    a.  pauciimmune vasculitis with renal involvement and hx of transient hemoptysis in the past with associated BOOP, 2008 (renal bx 2008: GLN with vasculitis). b. History of treatment with 2 cycles of Cytoxan and pheresis. H/o hemoptysis and pulm hemorrhage with 2nd cycle of cytoxan.     Past Surgical History:  Procedure Laterality Date  . CATARACT EXTRACTION    . CORONARY ARTERY BYPASS GRAFT  2002  . LEFT HEART CATHETERIZATION WITH CORONARY/GRAFT ANGIOGRAM N/A 07/26/2012   Procedure: LEFT HEART CATHETERIZATION WITH Beatrix Fetters;  Surgeon: Wellington Hampshire, MD;  Location: [facility] CATH LAB;  Service: Cardiovascular;  Laterality: N/A;  . PERCUTANEOUS CORONARY STENT INTERVENTION (PCI-S)  07/26/2012   Procedure: PERCUTANEOUS CORONARY STENT INTERVENTION (PCI-S);  Surgeon: Wellington Hampshire, MD;  Location: [facility] CATH LAB;  Service: Cardiovascular;;  . TOE AMPUTATION  2009   hammer toe   Family History  Problem Relation Age of Onset  . Heart disease Mother   . Diabetes Mother   . Prostate cancer Father   . Depression Other   . Diabetes Other   . Prostate cancer Other    Social History  Substance Use Topics  . Smoking status: Never Smoker  . Smokeless tobacco: Never Used  .  Alcohol use No    No Known Allergies   Review of Systems: All systems reviewed and negative except where noted in HPI.   Physical Exam: BP 132/80 (BP Location: Left Arm, Patient Position: Sitting, Cuff Size: Normal)   Pulse 76   Ht 5' 10.5" (1.791 m) Comment: height measured without shoes  Wt 244 lb 2 oz (110.7 kg)   BMI 34.53 kg/m  Constitutional: Pleasant,well-developed, male in no acute distress. HEENT: Normocephalic and atraumatic. Conjunctivae are normal. No scleral  icterus. Neck supple.  Cardiovascular: Normal rate, regular rhythm. No murmur heard. Pulmonary/chest: Effort normal and breath sounds normal. No wheezing, rales or rhonchi. Abdominal: Soft, nondistended, nontender. Bowel sounds active throughout. There are no masses palpable. No hepatomegaly. Extremities: no edema Lymphadenopathy: No cervical adenopathy noted. Neurological: Alert and oriented to person place and time. Skin: Skin is warm and dry. No rashes noted. Psychiatric: Normal mood and affect. Behavior is normal.   ASSESSMENT AND PLAN:  77. 80 year old male referred for colon cancer screening. He has never had a colonoscopy. No rectal bleeding, no unexplained weight loss. He has had some problems with constipation however. Patient not interested in a colonoscopy, feels it is invasive and risky with his co-morbities. We discussed options. He is probably not a candidate for virtual colonoscopy given renal function. -Patient is interested in [place]. [name] He understands that if test is positive we will need to have further discussion about colonoscopy.   2. Abdominal pain. Resolved. ED evaluation in [place] in mid September raised suspicion for mesenteric ischemia. Evaluated by Surgery, pain resolved and patient discharged from ED. No further abdominal pain.    3. Barrett's. Biopsy 2009, no HGD. On daily PPI. No dysphagia.   4. Multiple medical problems not limited to CAD s/p stenting, CKD 3, hx of vasculitis, thyroid disease and Barrett's .   5. Chronic antiplatelet therapy with plavix  6. Constipation. Some difficulty expelling stools / straining.  -trial of dulcolax or glycerin suppositories  Cc:  Donato Heinz, MD

## 2016-03-10 NOTE — Patient Instructions (Signed)
Please use Doculax suppositories as needed.( if more cost effective, use Glycerin suppositories as needed instead.)  If you are age 78 or older, your body mass index should be between 23-30. Your Body mass index is 31.8 kg/m. If this is out of the aforementioned range listed, please consider follow up with your Primary Care Provider.  If you are age 59 or younger, your body mass index should be between 19-25. Your Body mass index is 31.8 kg/m. If this is out of the aformentioned range listed, please consider follow up with your Primary Care Provider.   We have sent your demographic and insurance information to [employer]. They should contact you within the next week regarding your Cologuard (colon cancer screening) test. If you have not heard from them within the next week, please call our office at [phone].  Thank you for choosing [facility]

## 2016-03-15 NOTE — Progress Notes (Signed)
Chart reviewed. It appears this patient has a long segment of BE, 65m segment, diagnosed in 2009. He is overdue for a surveillance EGD. Nevin Bloodgood do you think this patient would be willing to have this done or should I see him in clinic to discuss this further? He would need to hold Plavix for 5 days prior to the procedure.  Otherwise I don't see record of CT in his chart. If he had isolated small bowel findings that would be quite unusual for it to represent ischemic changes. If I can review this we can discuss whether or not interval imaging is warranted. Otherwise okay to do Cologuard, although given his history, this may likely be positive and would recommend getting this result prior to scheduling EGD, as we could coordinate to do colonoscopy at same time as EGD if needed. Thanks

## 2016-03-17 ENCOUNTER — Telehealth: Payer: Self-pay | Admitting: Nurse Practitioner

## 2016-03-17 NOTE — Telephone Encounter (Signed)
I refaxed the orders to Cologuard.  Patient notified I refaxed the orders and that he should be hearing from Cologuard soon.

## 2016-03-18 DIAGNOSIS — Z1212 Encounter for screening for malignant neoplasm of rectum: Secondary | ICD-10-CM | POA: Diagnosis not present

## 2016-03-18 DIAGNOSIS — Z1211 Encounter for screening for malignant neoplasm of colon: Secondary | ICD-10-CM | POA: Diagnosis not present

## 2016-03-26 DIAGNOSIS — Z23 Encounter for immunization: Secondary | ICD-10-CM | POA: Diagnosis not present

## 2016-03-29 ENCOUNTER — Other Ambulatory Visit: Payer: Self-pay

## 2016-03-29 LAB — COLOGUARD: COLOGUARD: POSITIVE

## 2016-03-30 ENCOUNTER — Encounter: Payer: Self-pay | Admitting: Pulmonary Disease

## 2016-03-30 ENCOUNTER — Telehealth: Payer: Self-pay

## 2016-03-30 ENCOUNTER — Ambulatory Visit (INDEPENDENT_AMBULATORY_CARE_PROVIDER_SITE_OTHER)
Admission: RE | Admit: 2016-03-30 | Discharge: 2016-03-30 | Disposition: A | Discharge status: Home / Self Care | Payer: Medicare Other | Source: Ambulatory Visit | Attending: Pulmonary Disease | Admitting: Pulmonary Disease

## 2016-03-30 ENCOUNTER — Ambulatory Visit (INDEPENDENT_AMBULATORY_CARE_PROVIDER_SITE_OTHER): Payer: Medicare Other | Admitting: Pulmonary Disease

## 2016-03-30 VITALS — BP 126/68 | HR 97 | Ht 70.5 in | Wt 240.0 lb

## 2016-03-30 DIAGNOSIS — J8410 Pulmonary fibrosis, unspecified: Secondary | ICD-10-CM

## 2016-03-30 DIAGNOSIS — J8489 Other specified interstitial pulmonary diseases: Secondary | ICD-10-CM | POA: Diagnosis not present

## 2016-03-30 DIAGNOSIS — I2510 Atherosclerotic heart disease of native coronary artery without angina pectoris: Secondary | ICD-10-CM

## 2016-03-30 DIAGNOSIS — I2583 Coronary atherosclerosis due to lipid rich plaque: Secondary | ICD-10-CM

## 2016-03-30 DIAGNOSIS — R05 Cough: Secondary | ICD-10-CM | POA: Diagnosis not present

## 2016-03-30 NOTE — Progress Notes (Signed)
Subjective:    Patient ID: Luke Mcbride, male    DOB: [birth year], 80 y.o.   MRN: [id number]  Synopsis: Luke Mcbride has been followed in the [facility] pulmonary clinic since 2015 for vasculitis associated organizing pneumonia. His disease has been stable since his biopsy-proven diagnosis in 2009. Hx BOOP s/p OLBx 2009 Renal vasculitis, no evidence of pulmonary vasculitis on OLBx but presumed  HPI Chief Complaint  Patient presents with  . Follow-up    pt c/o increased cough, nonprod cough.  denies sinus congestion, chest pain.     Luke Mcbride says he has been doing OK.  He continues to follow with Dr. Arty Baumgartner and takes imuran.  He continues to do well.  He says that he sometimes has dyspnea climbing stairs or up hill, but he says this hasn't changed.  He doesn't exercise much.  No cough until last week when he go a flu shot and had some headache and chills. The symptoms have mostly subsided.   Past Medical History:  Diagnosis Date  . ALLERGIC RHINITIS   . Anemia   . Barrett's esophagus   . BOOP (bronchiolitis obliterans with organizing pneumonia) ([place])    a. s/p R VATS 2008.  [name] CAD (coronary artery disease)    a. 05/2000: NSTEMI/CABG x 6: LIMA->LAD, VG->D1, VG->OM1->2, VG->PDA->RPL;  b. 07/2007 MV: high lat infarct, no ischemia, EF 47%;  c. Cath/PCI: LM nl, LAD20p, 37m, D1 nl, D2 60-70ost, D3 nl, LCX 70ost, 148m, OM1/OM2 min irregs, RCA 30 diff, PDA 99, LIMA->LAD atretic, VG->D1 100, VG->OM1->2 100, VG->PDA->RPL 90p (4.0x23 Vision BMS);  c. 07/2012 Echo: EF 55%, gr1 DD.  [name] CKD (chronic kidney disease), stage III    a. renal bx 2008: GLN with vasculitis  . GERD (gastroesophageal reflux disease)   . Glaucoma    a. Cannot see out of L eye.  . Hematuria    Microscopic  . Hyperglycemia    Patient reported while on prednisone, had to take insulin  . Hyperlipemia   . Hypertension   . Hypothyroidism   . ILD (interstitial lung disease) ([place])   . Iritis   . Local infection of skin and  subcutaneous tissue   . Membranoproliferative nephritis   . Neutropenia, drug-induced ([place])   . Recurrent boils   . Residual foreign body in soft tissue   . Vasculitis ([place])    a.  pauciimmune vasculitis with renal involvement and hx of transient hemoptysis in the past with associated BOOP, 2008 (renal bx 2008: GLN with vasculitis). b. History of treatment with 2 cycles of Cytoxan and pheresis. H/o hemoptysis and pulm hemorrhage with 2nd cycle of cytoxan.      Review of Systems     Objective:   Physical Exam Vitals:   03/30/16 0911  BP: 126/68  Pulse: 97  SpO2: 98%  Weight: 240 lb (108.9 kg)  Height: 5' 10.5" (1.791 m)   RA  Gen: well appearing HENT: OP clear, TM's clear, neck supple PULM: CTA B, normal percussion CV: RRR, no mgr, trace edema GI: BS+, soft, nontender Derm: no cyanosis or rash Psyche: normal mood and affect   03/2015 CXR > normal     Assessment & Plan:  INTERSTITIAL LUNG DISEASE He has organizing pneumonia related to small vessel vasculitis as proven on biopsy in 2009. Fortunately for him his disease has been quiescent and well controlled on Imuran.  Given his persistent stability I don't think it's necessary for him to continue to follow-up with our clinic. We'll  check one last ambulatory oximetry today and will check another chest x-ray. I advised him that if he develops worsening shortness of breath, chest pain, or hemoptysis to be sure to follow-up with [place] again.  In the meantime, I recommend that he continue taking Imuran as directed by Dr. Arty Baumgartner.    Current Outpatient Prescriptions:  .  aspirin EC 81 MG EC tablet, Take 1 tablet (81 mg total) by mouth daily., Disp: 30 tablet, Rfl:  .  azaTHIOprine (IMURAN) 50 MG tablet, TAKE 2 TABLETS(100MG ) BY MOUTH IN THE AM AND 1 TABLET(50MG ) BY MOUTH IN THE PM, Disp: , Rfl:  .  clopidogrel (PLAVIX) 75 MG tablet, Take 1 tablet (75 mg total) by mouth daily., Disp: 90 tablet, Rfl: 2 .  finasteride  (PROSCAR) 5 MG tablet, Take 5 mg by mouth daily., Disp: , Rfl:  .  furosemide (LASIX) 20 MG tablet, Take 1 tablet (20 mg total) by mouth daily., Disp: 90 tablet, Rfl: 2 .  levothyroxine (SYNTHROID, LEVOTHROID) 100 MCG tablet, Take 100 mcg by mouth daily.  , Disp: , Rfl:  .  metoprolol tartrate (LOPRESSOR) 25 MG tablet, Take 0.5 tablets (12.5 mg total) by mouth 2 (two) times daily., Disp: 90 tablet, Rfl: 2 .  Multiple Vitamin (MULTIVITAMIN) tablet, Take 1 tablet by mouth daily.  , Disp: , Rfl:  .  NITROSTAT 0.4 MG SL tablet, PLACE 1 TABLET (0.4 MG TOTAL) UNDER THE TONGUE EVERY 5 (FIVE) MINUTES AS NEEDED. MAY REPEAT X3, Disp: 25 tablet, Rfl: 2 .  pantoprazole (PROTONIX) 40 MG tablet, TAKE 1 TABLET (40 MG TOTAL) BY MOUTH DAILY., Disp: 90 tablet, Rfl: 2 .  rosuvastatin (CRESTOR) 10 MG tablet, Take 10 mg by mouth daily., Disp: , Rfl:  .  Tamsulosin HCl (FLOMAX) 0.4 MG CAPS, Take 0.4 mg by mouth daily.  , Disp: , Rfl:  .  telmisartan (MICARDIS) 40 MG tablet, Take 20 mg by mouth daily. , Disp: , Rfl:

## 2016-03-30 NOTE — Telephone Encounter (Signed)
Cologuard is positive. Report printed.

## 2016-03-30 NOTE — Telephone Encounter (Signed)
Yep, and that's only part of story. Also has barrett's so if agrees to colonoscopy should go ahead with egd for barrett's surveillance. Also recent abnormal CTscan from [facility] so Armbruster and I will be talking about all this and will let you know. He needs EGD and Colon but patient was resistant when I saw him in clinic. Thanks

## 2016-03-30 NOTE — Patient Instructions (Signed)
we will call you with the results of the chest x-ray Follow-up with [place] if you develop worsening shortness of breath, hemoptysis or chest pain.

## 2016-03-30 NOTE — Assessment & Plan Note (Signed)
He has organizing pneumonia related to small vessel vasculitis as proven on biopsy in 2009. Fortunately for him his disease has been quiescent and well controlled on Imuran.  Given his persistent stability I don't think it's necessary for him to continue to follow-up with our clinic. We'll check one last ambulatory oximetry today and will check another chest x-ray. I advised him that if he develops worsening shortness of breath, chest pain, or hemoptysis to be sure to follow-up with [place] again.  In the meantime, I recommend that he continue taking Imuran as directed by Dr. Arty Baumgartner.

## 2016-04-01 ENCOUNTER — Telehealth: Payer: Self-pay | Admitting: Gastroenterology

## 2016-04-01 NOTE — Telephone Encounter (Signed)
This patient had a positive Cologuard and is in need of a colonoscopy. Additionally, he has a history of long segment Barrett's and in need of a surveillance EGD. He will need to hold plavix for 5 days prior to the procedure, but okay to take aspirin 81mg  during this time if he wishes to take this instead of plavix. We will need to obtain clearance to hold plavix from his ordering prescriber. Can you please coordinate these exams for him? Thanks much

## 2016-04-06 ENCOUNTER — Telehealth: Payer: Self-pay | Admitting: Cardiovascular Disease

## 2016-04-06 NOTE — Telephone Encounter (Signed)
New message  Calling about witholding plavix prior to procedure  EGD/colonoscopy  Please return call

## 2016-04-07 ENCOUNTER — Other Ambulatory Visit: Payer: Self-pay

## 2016-04-07 DIAGNOSIS — K22719 Barrett's esophagus with dysplasia, unspecified: Secondary | ICD-10-CM

## 2016-04-07 NOTE — Telephone Encounter (Signed)
Ok to hold plavix 5 days before GI procedures

## 2016-04-07 NOTE — Telephone Encounter (Signed)
Spoke w/Julie at [facility] and notified her that Dr. [name] stated [name] can hold his Plavix 5 days prior to EGD/colonoscopy.  Continue on ASA.  Also notified pt that he may stop Plavix 5 days prior to procedures.

## 2016-04-07 NOTE — Telephone Encounter (Signed)
Called [facility] and obtained fax number [phone].  Clearance faxed.

## 2016-04-07 NOTE — Telephone Encounter (Signed)
Per Dr [name] pt is ok to hold Plavix 5 days prior to procedure. Pt informed. EGD scheduled 04/23/16 at 4pm  Pt will come by office to receive instructions 04/16/16. Forms at my desk in yellow folder.

## 2016-04-12 NOTE — Telephone Encounter (Signed)
Luke Mcbride, this patient is supposed to be scheduled for a colonoscopy and an EGD. Luke Mcbride caught this. Can you contact the patient and get the exams coordinated? Thank you.

## 2016-04-14 ENCOUNTER — Other Ambulatory Visit: Payer: Self-pay

## 2016-04-14 MED ORDER — NA SULFATE-K SULFATE-MG SULF 17.5-3.13-1.6 GM/177ML PO SOLN: 1.0000 | Freq: Once | ORAL | 0 refills | Status: AC

## 2016-04-14 NOTE — Telephone Encounter (Signed)
EGD/Colon scheduled 04/23/16 at 9:30. Pt informed. He will sign consent form the day of procedure (ok per [facility]). Mailed instructions to pt and sent in Rx for Suprep.  Highlighted on pts instructions that he should hold Plavix 5 days prior to the procedure day.

## 2016-04-23 ENCOUNTER — Encounter: Payer: Medicare Other | Admitting: Gastroenterology

## 2016-04-23 ENCOUNTER — Ambulatory Visit (AMBULATORY_SURGERY_CENTER): Payer: Medicare Other | Admitting: Gastroenterology

## 2016-04-23 ENCOUNTER — Encounter: Payer: Self-pay | Admitting: Gastroenterology

## 2016-04-23 VITALS — BP 120/49 | HR 80 | Temp 96.9°F | Resp 22 | Ht 70.5 in | Wt 244.0 lb

## 2016-04-23 DIAGNOSIS — I2510 Atherosclerotic heart disease of native coronary artery without angina pectoris: Secondary | ICD-10-CM | POA: Diagnosis not present

## 2016-04-23 DIAGNOSIS — D123 Benign neoplasm of transverse colon: Secondary | ICD-10-CM

## 2016-04-23 DIAGNOSIS — R195 Other fecal abnormalities: Secondary | ICD-10-CM | POA: Diagnosis present

## 2016-04-23 DIAGNOSIS — Z1211 Encounter for screening for malignant neoplasm of colon: Secondary | ICD-10-CM | POA: Diagnosis not present

## 2016-04-23 DIAGNOSIS — R109 Unspecified abdominal pain: Secondary | ICD-10-CM | POA: Diagnosis not present

## 2016-04-23 DIAGNOSIS — K317 Polyp of stomach and duodenum: Secondary | ICD-10-CM | POA: Diagnosis not present

## 2016-04-23 DIAGNOSIS — K2270 Barrett's esophagus without dysplasia: Secondary | ICD-10-CM | POA: Diagnosis not present

## 2016-04-23 DIAGNOSIS — D122 Benign neoplasm of ascending colon: Secondary | ICD-10-CM

## 2016-04-23 MED ORDER — SODIUM CHLORIDE 0.9 % IV SOLN: 500.0000 mL | INTRAVENOUS | Status: DC

## 2016-04-23 NOTE — Progress Notes (Signed)
Dental advisory given to patient 

## 2016-04-23 NOTE — Op Note (Signed)
[facility] Patient Name: Omarian Jaquith Procedure Date: 04/23/2016 9:49 AM MRN: [id number] Endoscopist: Remo Lipps P. Laasya Peyton MD, MD Age: 80 Referring MD:  Date of Birth: Dec 22, 1935 Gender: Male Account #: [id number] Procedure:                Upper GI endoscopy Indications:              Surveillance for malignancy due to personal history                            of Barrett's esophagus Medicines:                Monitored Anesthesia Care Procedure:                Pre-Anesthesia Assessment:                           - Prior to the procedure, a History and Physical                            was performed, and patient medications and                            allergies were reviewed. The patient's tolerance of                            previous anesthesia was also reviewed. The risks                            and benefits of the procedure and the sedation                            options and risks were discussed with the patient.                            All questions were answered, and informed consent                            was obtained. Prior Anticoagulants: The patient has                            taken Plavix (clopidogrel), last dose was 5 days                            prior to procedure. ASA Grade Assessment: III - A                            patient with severe systemic disease. After                            reviewing the risks and benefits, the patient was                            deemed in satisfactory condition to undergo the  procedure.                           After obtaining informed consent, the endoscope was                            passed under direct vision. Throughout the                            procedure, the patient's blood pressure, pulse, and                            oxygen saturations were monitored continuously. The                            Model GIF-HQ190 [id number]) scope was introduced                             through the mouth, and advanced to the second part                            of duodenum. The upper GI endoscopy was                            accomplished without difficulty. The patient                            tolerated the procedure well. Scope In: Scope Out: Findings:                 Esophagogastric landmarks were identified: the                            Z-line was found at 33 cm, the gastroesophageal                            junction was found at 38 cm and the upper extent of                            the gastric folds was found at 40 cm from the                            incisors.                           A 2 cm hiatal hernia was present.                           There were esophageal mucosal changes classified as                            Barrett's stage C3-M5 per Prague criteria present                            in the lower third of the esophagus. The  maximum                            longitudinal extent of these mucosal changes was 5                            cm in length. Mucosa was biopsied with a cold                            forceps for histology in 4 quadrants at intervals                            of 1 cm. A total of 3 specimen bottles were sent to                            pathology. No nodularity was appreciated.                           The exam of the esophagus was otherwise normal.                           A single 8 mm sessile polyp with no stigmata of                            recent bleeding was found in the cardia. The polyp                            was removed with a hot snare. Resection and                            retrieval were complete.                           A single 4-5 mm sessile polyp was found on the                            greater curvature of the gastric body, slightly                            yellowish hue. Multiple biopsies were taken with a                            cold forceps for histology, I  suspect most of this                            lesion was removed.                           The exam of the stomach was otherwise normal.                           The duodenal bulb and second portion of the  duodenum were normal. Complications:            No immediate complications. Estimated blood loss:                            Minimal. Estimated Blood Loss:     Estimated blood loss was minimal. Impression:               - Esophagogastric landmarks identified.                           - 2 cm hiatal hernia.                           - Esophageal mucosal changes classified as                            Barrett's stage C3-M5 per Prague criteria. Biopsied.                           - A single gastric polyp. Resected and retrieved.                           - A single gastric polyp. Biopsied.                           - Normal duodenal bulb and second portion of the                            duodenum. Recommendation:           - Patient has a contact number available for                            emergencies. The signs and symptoms of potential                            delayed complications were discussed with the                            patient. Return to normal activities tomorrow.                            Written discharge instructions were provided to the                            patient.                           - Resume previous diet.                           - Continue present medications.                           - Hold plavix for another 3 days, then resume                           -  No ibuprofen, naproxen, or other non-steroidal                            anti-inflammatory drugs for 2 weeks after polyp                            removal.                           - Await pathology results.                           - Repeat upper endoscopy for surveillance pending                            pathology Remo Lipps P. Gardiner Espana MD,  MD 04/23/2016 10:49:02 AM This report has been signed electronically.

## 2016-04-23 NOTE — Progress Notes (Signed)
A/ox3 pleased with MAC, report to Annette RN 

## 2016-04-23 NOTE — Op Note (Signed)
[name] Patient Name: Luke Mcbride Procedure Date: 04/23/2016 9:49 AM MRN: [id number] Endoscopist: Remo Lipps P. Primo Innis MD, MD Age: 80 Referring MD:  Date of Birth: February 21, 1936 Gender: Male Account #: [id number] Procedure:                Colonoscopy Indications:              Positive Cologuard test Medicines:                Monitored Anesthesia Care Procedure:                Pre-Anesthesia Assessment:                           - Prior to the procedure, a History and Physical                            was performed, and patient medications and                            allergies were reviewed. The patient's tolerance of                            previous anesthesia was also reviewed. The risks                            and benefits of the procedure and the sedation                            options and risks were discussed with the patient.                            All questions were answered, and informed consent                            was obtained. Prior Anticoagulants: The patient has                            taken Plavix (clopidogrel), last dose was 5 days                            prior to procedure. ASA Grade Assessment: III - A                            patient with severe systemic disease. After                            reviewing the risks and benefits, the patient was                            deemed in satisfactory condition to undergo the                            procedure.  After obtaining informed consent, the colonoscope                            was passed under direct vision. Throughout the                            procedure, the patient's blood pressure, pulse, and                            oxygen saturations were monitored continuously. The                            Model CF-[id number] [id number]) scope was introduced                            through the anus and advanced to the the cecum,             identified by appendiceal orifice and ileocecal                            valve. The colonoscopy was performed without                            difficulty. The patient tolerated the procedure                            well. The quality of the bowel preparation was                            adequate. The ileocecal valve, appendiceal orifice,                            and rectum were photographed. Scope In: 10:12:46 AM Scope Out: 10:35:48 AM Scope Withdrawal Time: 0 hours 12 minutes 31 seconds  Total Procedure Duration: 0 hours 23 minutes 2 seconds  Findings:                 The perianal and digital rectal examinations were                            normal.                           Two sessile polyps were found in the ascending                            colon. The polyps were 4 to 6 mm in size. These                            polyps were removed with a cold snare. Resection                            and retrieval were complete.  Three sessile polyps were found in the transverse                            colon. The polyps were 4 to 5 mm in size. These                            polyps were removed with a cold snare. Resection                            and retrieval were complete.                           Many large-mouthed diverticula were found in the                            left colon.                           Internal hemorrhoids were found during retroflexion.                           The exam was otherwise without abnormality. Complications:            No immediate complications. Estimated blood loss:                            Minimal. Estimated Blood Loss:     Estimated blood loss was minimal. Impression:               - Two 4 to 6 mm polyps in the ascending colon,                            removed with a cold snare. Resected and retrieved.                           - Three 4 to 5 mm polyps in the transverse colon,                             removed with a cold snare. Resected and retrieved.                           - Diverticulosis in the left colon.                           - Internal hemorrhoids.                           - The examination was otherwise normal. Recommendation:           - Patient has a contact number available for                            emergencies. The signs and symptoms of potential  delayed complications were discussed with the                            patient. Return to normal activities tomorrow.                            Written discharge instructions were provided to the                            patient.                           - Resume previous diet.                           - Continue present medications.                           - Hold plavix another 3 days, then resume                           - No ibuprofen, naproxen, or other non-steroidal                            anti-inflammatory drugs for 2 weeks after polyp                            removal.                           - Await pathology results.                           - Repeat colonoscopy is recommended for                            surveillance. The colonoscopy date will be                            determined after pathology results from today's                            exam become available for review. Remo Lipps P. Kaileigh Viswanathan MD, MD 04/23/2016 10:43:41 AM This report has been signed electronically.

## 2016-04-23 NOTE — Progress Notes (Signed)
Also Per Dr. Havery Moros, pt may resume aspirin 81 mg daily.  Pt & his wife were instructed to do so. maw

## 2016-04-23 NOTE — Patient Instructions (Signed)
YOU HAD AN ENDOSCOPIC PROCEDURE TODAY AT [facility]:   Refer to the procedure report that was given to you for any specific questions about what was found during the examination.  If the procedure report does not answer your questions, please call your gastroenterologist to clarify.  If you requested that your care partner not be given the details of your procedure findings, then the procedure report has been included in a sealed envelope for you to review at your convenience later.  YOU SHOULD EXPECT: Some feelings of bloating in the abdomen. Passage of more gas than usual.  Walking can help get rid of the air that was put into your GI tract during the procedure and reduce the bloating. If you had a lower endoscopy (such as a colonoscopy or flexible sigmoidoscopy) you may notice spotting of blood in your stool or on the toilet paper. If you underwent a bowel prep for your procedure, you may not have a normal bowel movement for a few days.  Please Note:  You might notice some irritation and congestion in your nose or some drainage.  This is from the oxygen used during your procedure.  There is no need for concern and it should clear up in a day or so.  SYMPTOMS TO REPORT IMMEDIATELY:   Following lower endoscopy (colonoscopy or flexible sigmoidoscopy):  Excessive amounts of blood in the stool  Significant tenderness or worsening of abdominal pains  Swelling of the abdomen that is new, acute  Fever of 100F or higher   Following upper endoscopy (EGD)  Vomiting of blood or coffee ground material  New chest pain or pain under the shoulder blades  Painful or persistently difficult swallowing  New shortness of breath  Fever of 100F or higher  Black, tarry-looking stools  For urgent or emergent issues, a gastroenterologist can be reached at any hour by calling [phone].   DIET:  We do recommend a small meal at first, but then you may proceed to your regular diet.  Drink  plenty of fluids but you should avoid alcoholic beverages for 24 hours.  ACTIVITY:  You should plan to take it easy for the rest of today and you should NOT DRIVE or use heavy machinery until tomorrow (because of the sedation medicines used during the test).    FOLLOW UP: Our staff will call the number listed on your records the next business day following your procedure to check on you and address any questions or concerns that you may have regarding the information given to you following your procedure. If we do not reach you, we will leave a message.  However, if you are feeling well and you are not experiencing any problems, there is no need to return our call.  We will assume that you have returned to your regular daily activities without incident.  If any biopsies were taken you will be contacted by phone or by letter within the next 1-3 weeks.  Please call us at [phone] if you have not heard about the biopsies in 3 weeks.    SIGNATURES/CONFIDENTIALITY: You and/or your care partner have signed paperwork which will be entered into your electronic medical record.  These signatures attest to the fact that that the information above on your After Visit Summary has been reviewed and is understood.  Full responsibility of the confidentiality of this discharge information lies with you and/or your care-partner.      Handouts were given to your care partner  on barrett's esophagus, hiatal hernia, polyps, diverticulosis, and hemorrhoids. Per Dr. Havery Moros hold PLAVIX another 3 days, then resume on Monday. No aspirin, aspirin products,  ibuprofen, naproxen, advil, motrin, aleve, or other non-steroidal anti-inflammatory drugs for 14 days. after polyp removal. You may resume your other current medications today. Await biopsy results. Please call if any questions or concerns.

## 2016-04-23 NOTE — Progress Notes (Signed)
After pt dressed I looked at his IV site and he had a hematoma to top of left hand.  Held direct pressure to top of his left hand.  I removed tape and 2x2 dressing, only a drop of blood on the dressing.  I applied a warm pack to the top of left hand.  Pt denied pain.  He will use a heating pad to left hand off and on.  maw

## 2016-04-23 NOTE — Progress Notes (Signed)
No problems noted in the recovery room. maw 

## 2016-04-23 NOTE — Progress Notes (Signed)
Called to room to assist during endoscopic procedure.  Patient ID and intended procedure confirmed with present staff. Received instructions for my participation in the procedure from the performing physician.  

## 2016-04-26 ENCOUNTER — Telehealth: Payer: Self-pay | Admitting: *Deleted

## 2016-04-26 NOTE — Telephone Encounter (Signed)
  Follow up Call-  Call back number 04/23/2016  Post procedure Call Back phone  # [phone]  Permission to leave phone message Yes  Some recent data might be hidden     Patient questions:  Do you have a fever, pain , or abdominal swelling? No. Pain Score  0 *  Have you tolerated food without any problems? Yes.    Have you been able to return to your normal activities? Yes.    Do you have any questions about your discharge instructions: Diet   No. Medications  No. Follow up visit  No.  Do you have questions or concerns about your Care? No.  Actions: * If pain score is 4 or above: No action needed, pain <4.

## 2016-05-03 ENCOUNTER — Encounter: Payer: Self-pay | Admitting: Gastroenterology

## 2016-05-24 ENCOUNTER — Telehealth: Payer: Self-pay | Admitting: Gastroenterology

## 2016-05-24 NOTE — Telephone Encounter (Signed)
Patient called. He is having some intermittent epigastric pain after he eats. Protonix does seem to help. He had Barrett's esophagus noted on recent EGD but no evidence of PUD or gastritis. Discomfort seems short lived. He denies weight loss. Recommend a trial of protonix 40mg  twice daily for 1-2 weeks and see if this helps. Otherwise GB remains in place and could be biliary colic. I think ordered an Korea is reasonable to rule out gallstones.   Almyra Free can you order an US of the RUQ for this patient, to rule out gallstones, scheduled for 2 weeks or so. If the patient's symptoms resolves on higher dose protonix he can cancel the [place], however if it persists he should continue with the exam. If Korea negative and symptoms persist may consider repeat CT given prior history of abdominal pain and abnormal CT in Sept, however this seems different from prior symptoms. Thanks

## 2016-05-24 NOTE — Telephone Encounter (Signed)
Spoke to patient, he reports that he has been having abdominal pain after eating, denies nausea or vomiting. No changes in his bowel movements. He started taking the Protonix prior to eating in the morning and this does seem to help. Please advise.

## 2016-05-25 ENCOUNTER — Other Ambulatory Visit: Payer: Self-pay

## 2016-05-25 DIAGNOSIS — R1011 Right upper quadrant pain: Secondary | ICD-10-CM

## 2016-05-25 NOTE — Telephone Encounter (Signed)
Patient advised that RUQ Korea is scheduled on 1/23 for 9:00, arrive at 8:45, NPO after midnight. Let him know that with the increase in his Protonix, if this helps his pain and he wishes to cancel the Korea he needs to call the office, otherwise will let him know after results come back.

## 2016-06-08 ENCOUNTER — Telehealth: Payer: Self-pay

## 2016-06-08 ENCOUNTER — Ambulatory Visit (HOSPITAL_COMMUNITY)
Admission: RE | Admit: 2016-06-08 | Discharge: 2016-06-08 | Disposition: A | Discharge status: Home / Self Care | Payer: Medicare Other | Source: Ambulatory Visit | Attending: Gastroenterology | Admitting: Gastroenterology

## 2016-06-08 DIAGNOSIS — K760 Fatty (change of) liver, not elsewhere classified: Secondary | ICD-10-CM | POA: Diagnosis not present

## 2016-06-08 DIAGNOSIS — K7689 Other specified diseases of liver: Secondary | ICD-10-CM | POA: Diagnosis not present

## 2016-06-08 DIAGNOSIS — R1011 Right upper quadrant pain: Secondary | ICD-10-CM

## 2016-06-08 NOTE — Telephone Encounter (Signed)
Chart reviewed. He had an abnormal CT scan I believe in September which led to admission and surgical evaluation. Unclear what caused that finding but given his persistent pain, I think a CT scan with oral contrast would be good idea. He has chronic kidney disease so cannot have IV contrast.  Can you please order CT scan of the abdomen and pelvis with oral contrast only (no IV contrast) to further evaluate. Thanks

## 2016-06-08 NOTE — Telephone Encounter (Signed)
Pt aware of u/s results. He is taking Protonix BID but this does not help with his pain. He c/o pain above his navel and to the right. Pain comes in waves after eating. He states that the only thing that he is eating is eggs, oatmeal nothing too fatty.

## 2016-06-08 NOTE — Telephone Encounter (Signed)
-----   Message from Manus Gunning, MD sent at 06/08/2016 12:59 PM EST ----- Caryl Pina can you please let this patient know that his gallbladder looks good on the Korea, may be less likely the cause for his symptoms. Otherwise he does have some fatty liver noted on Korea and benign cysts  We had recommended he increase protonix to twice daily for his symptoms. I hope this has provided relief. If his symptoms persist can you let me know so we can make further recommendations? Thanks

## 2016-06-09 ENCOUNTER — Other Ambulatory Visit: Payer: Self-pay

## 2016-06-09 DIAGNOSIS — R1033 Periumbilical pain: Secondary | ICD-10-CM

## 2016-06-09 NOTE — Telephone Encounter (Signed)
Scheduled CT with Stacy at [facility]. Scan scheduled for 06/15/16 at 11:30. Pt to arrive 15 minutes early and be NPO 4h prior. He will need to drink contrast at 9:30am and 10:30 am.

## 2016-06-10 NOTE — Telephone Encounter (Signed)
Pt made aware of appt date/time. He will come by office to pick up oral contrast and instructions (at front desk).

## 2016-06-11 DIAGNOSIS — E039 Hypothyroidism, unspecified: Secondary | ICD-10-CM | POA: Diagnosis not present

## 2016-06-11 DIAGNOSIS — I2510 Atherosclerotic heart disease of native coronary artery without angina pectoris: Secondary | ICD-10-CM | POA: Diagnosis not present

## 2016-06-11 DIAGNOSIS — D638 Anemia in other chronic diseases classified elsewhere: Secondary | ICD-10-CM | POA: Diagnosis not present

## 2016-06-11 DIAGNOSIS — J42 Unspecified chronic bronchitis: Secondary | ICD-10-CM | POA: Diagnosis not present

## 2016-06-11 DIAGNOSIS — N2581 Secondary hyperparathyroidism of renal origin: Secondary | ICD-10-CM | POA: Diagnosis not present

## 2016-06-11 DIAGNOSIS — N400 Enlarged prostate without lower urinary tract symptoms: Secondary | ICD-10-CM | POA: Diagnosis not present

## 2016-06-11 DIAGNOSIS — R809 Proteinuria, unspecified: Secondary | ICD-10-CM | POA: Diagnosis not present

## 2016-06-11 DIAGNOSIS — E1129 Type 2 diabetes mellitus with other diabetic kidney complication: Secondary | ICD-10-CM | POA: Diagnosis not present

## 2016-06-11 DIAGNOSIS — N183 Chronic kidney disease, stage 3 (moderate): Secondary | ICD-10-CM | POA: Diagnosis not present

## 2016-06-11 DIAGNOSIS — I129 Hypertensive chronic kidney disease with stage 1 through stage 4 chronic kidney disease, or unspecified chronic kidney disease: Secondary | ICD-10-CM | POA: Diagnosis not present

## 2016-06-11 DIAGNOSIS — E7800 Pure hypercholesterolemia, unspecified: Secondary | ICD-10-CM | POA: Diagnosis not present

## 2016-06-11 DIAGNOSIS — R109 Unspecified abdominal pain: Secondary | ICD-10-CM | POA: Diagnosis not present

## 2016-06-15 ENCOUNTER — Ambulatory Visit (INDEPENDENT_AMBULATORY_CARE_PROVIDER_SITE_OTHER)
Admission: RE | Admit: 2016-06-15 | Discharge: 2016-06-15 | Disposition: A | Discharge status: Home / Self Care | Payer: Medicare Other | Source: Ambulatory Visit | Attending: Gastroenterology | Admitting: Gastroenterology

## 2016-06-15 DIAGNOSIS — R1033 Periumbilical pain: Secondary | ICD-10-CM | POA: Diagnosis not present

## 2016-06-15 DIAGNOSIS — K5900 Constipation, unspecified: Secondary | ICD-10-CM | POA: Diagnosis not present

## 2016-06-16 DIAGNOSIS — D0472 Carcinoma in situ of skin of left lower limb, including hip: Secondary | ICD-10-CM | POA: Diagnosis not present

## 2016-06-16 DIAGNOSIS — X32XXXD Exposure to sunlight, subsequent encounter: Secondary | ICD-10-CM | POA: Diagnosis not present

## 2016-06-16 DIAGNOSIS — L570 Actinic keratosis: Secondary | ICD-10-CM | POA: Diagnosis not present

## 2016-06-16 DIAGNOSIS — L820 Inflamed seborrheic keratosis: Secondary | ICD-10-CM | POA: Diagnosis not present

## 2016-06-21 ENCOUNTER — Telehealth: Payer: Self-pay

## 2016-06-21 NOTE — Telephone Encounter (Signed)
Patient is scheduled to see Dr. Donne Hazel on 07/06/16 at [facility].

## 2016-06-21 NOTE — Telephone Encounter (Signed)
Thanks Almyra Free, I sent him a message

## 2016-06-24 DIAGNOSIS — K6389 Other specified diseases of intestine: Secondary | ICD-10-CM | POA: Diagnosis not present

## 2016-06-25 ENCOUNTER — Telehealth: Payer: Self-pay

## 2016-06-25 DIAGNOSIS — I2510 Atherosclerotic heart disease of native coronary artery without angina pectoris: Secondary | ICD-10-CM

## 2016-06-25 DIAGNOSIS — Z01818 Encounter for other preprocedural examination: Secondary | ICD-10-CM

## 2016-06-25 NOTE — Telephone Encounter (Signed)
Called patient about needing a lexiscan for clearance for up coming surgery. Went over instructions for test and patient verbalized understanding. Encouraged patient to call office with any other questions. Informed patient that scheduling will call him to schedule.

## 2016-06-28 ENCOUNTER — Telehealth (HOSPITAL_COMMUNITY): Payer: Self-pay | Admitting: *Deleted

## 2016-06-28 NOTE — Telephone Encounter (Signed)
Patient given detailed instructions per Myocardial Perfusion Study Information Sheet for the test on 06/30/16. Patient notified to arrive 15 minutes early and that it is imperative to arrive on time for appointment to keep from having the test rescheduled.  If you need to cancel or reschedule your appointment, please call the office within 24 hours of your appointment. Failure to do so may result in a cancellation of your appointment, and a $50 no show fee. Patient verbalized understanding. Kirstie Peri

## 2016-06-29 ENCOUNTER — Telehealth: Payer: Self-pay | Admitting: Pulmonary Disease

## 2016-06-29 NOTE — Telephone Encounter (Signed)
Sharyn Lull called back from [place] - she will be going to lunch and will not be available until 1:30pm. Her phone number is [phone] ask for nursing office -pr

## 2016-06-29 NOTE — Telephone Encounter (Signed)
Spoke with Sharyn Lull at [facility]. She is aware that we will need to see pt before we can give him surgical clearance. Pt has been scheduled to see SG on 07/01/16 at 10am. Nothing further was needed.

## 2016-06-29 NOTE — Telephone Encounter (Signed)
lmtcb X1 for [place] at [facility]

## 2016-06-30 ENCOUNTER — Ambulatory Visit (HOSPITAL_COMMUNITY): Payer: Medicare Other | Attending: Cardiology

## 2016-06-30 DIAGNOSIS — Z01818 Encounter for other preprocedural examination: Secondary | ICD-10-CM | POA: Diagnosis not present

## 2016-06-30 DIAGNOSIS — I2510 Atherosclerotic heart disease of native coronary artery without angina pectoris: Secondary | ICD-10-CM | POA: Diagnosis not present

## 2016-06-30 MED ORDER — TECHNETIUM TC 99M TETROFOSMIN IV KIT
10.4000 | PACK | Freq: Once | INTRAVENOUS | Status: AC | PRN
  Administered 2016-06-30: 10.4 via INTRAVENOUS
  Filled 2016-06-30: qty 11

## 2016-06-30 MED ORDER — TECHNETIUM TC 99M TETROFOSMIN IV KIT
31.9000 | PACK | Freq: Once | INTRAVENOUS | Status: AC | PRN
  Administered 2016-06-30: 31.9 via INTRAVENOUS
  Filled 2016-06-30: qty 32

## 2016-06-30 MED ORDER — REGADENOSON 0.4 MG/5ML IV SOLN
0.4000 mg | Freq: Once | INTRAVENOUS | Status: AC
  Administered 2016-06-30: 0.4 mg via INTRAVENOUS

## 2016-07-01 ENCOUNTER — Ambulatory Visit (INDEPENDENT_AMBULATORY_CARE_PROVIDER_SITE_OTHER)
Admission: RE | Admit: 2016-07-01 | Discharge: 2016-07-01 | Disposition: A | Discharge status: Home / Self Care | Payer: Medicare Other | Source: Ambulatory Visit | Attending: Acute Care | Admitting: Acute Care

## 2016-07-01 ENCOUNTER — Encounter: Payer: Self-pay | Admitting: Acute Care

## 2016-07-01 ENCOUNTER — Ambulatory Visit (INDEPENDENT_AMBULATORY_CARE_PROVIDER_SITE_OTHER): Payer: Medicare Other | Admitting: Acute Care

## 2016-07-01 DIAGNOSIS — R918 Other nonspecific abnormal finding of lung field: Secondary | ICD-10-CM | POA: Diagnosis not present

## 2016-07-01 DIAGNOSIS — J8410 Pulmonary fibrosis, unspecified: Secondary | ICD-10-CM

## 2016-07-01 DIAGNOSIS — I2510 Atherosclerotic heart disease of native coronary artery without angina pectoris: Secondary | ICD-10-CM

## 2016-07-01 LAB — MYOCARDIAL PERFUSION IMAGING
CHL CUP NUCLEAR SSS: 14
CSEPPHR: 108 {beats}/min
LV sys vol: 70 mL
LVDIAVOL: 117 mL (ref 62–150)
NUC STRESS TID: 0.87
RATE: 0.35
Rest HR: 80 {beats}/min
SDS: 5
SRS: 9

## 2016-07-01 NOTE — Patient Instructions (Addendum)
It is nice to meet you today. We will give you clearance from a Pulmonary standpoint for surgery. Remember to be diligant about getting out of bed and coughing and deep breathing after surgery. You are at increased risk of pulmonary complications due to your underlying vasculitis. Continued Follow UP with Dr. Vanita Panda in patient and out patient. Call us if you develop shortness of breath, cough or any change in sputum. Follow up with Dr. Lake Bells in 3 months post op Please contact office for sooner follow up if symptoms do not improve or worsen or seek emergency care

## 2016-07-01 NOTE — Assessment & Plan Note (Addendum)
Surgical clearance for removal of small bowel lesion in LUQ of abdomen, worrisome for non-obstructing small bowel neoplasm. Plan: Cleared for surgery from pulmonary standpoint Moderate/High risk for pulmonary complications post operatively. ( See Risk Score Tools) Remember to be diligant about getting out of bed and coughing and deep breathing after surgery. You are at increased risk of pulmonary complications due to your underlying vasculitis. Continued Follow UP with Dr. Vanita Panda in patient and out patient. Call us if you develop shortness of breath, cough or any change in sputum. Follow up with Dr. Lake Bells in 3 months post op to assure you are doing well. Recommendations to surgery per note 07/01/2016. Recommend 1. Short duration of surgery as much as possible and avoid paralytic if possible 2. Recovery in step down or ICU with Pulmonary consultation 3. DVT prophylaxis 4. Aggressive pulmonary toilet with O2, bronchodilatation, and incentive spirometry and early ambulation  Please contact office for sooner follow up if symptoms do not improve or worsen or seek emergency care

## 2016-07-01 NOTE — Progress Notes (Signed)
History of Present Illness Luke Mcbride is a 81 y.o. male  followed in the [place] [facility] since 2015 for vasculitis associated organizing pneumonia. His disease has been stable since his biopsy-proven diagnosis in 2009. 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 Treatment with Imuran per Dr. Vanita Panda.   07/01/2016 Surgical Clearance Visit: Pt. Presents today for surgical clearance for abdominal surgery to remove a mass / Small bowel lesion is identified within the left upper quadrant the abdomen. Finding is worrisome for non obstructing small bowel neoplasm. He has not had any significant respiratory problems over the last year.,  His disease has been quiescent and well controlled on Imuran per Dr. Vanita Panda ( Renal).His only complaint is some shortness of breath with climbing stairs which he states he has had since 2002. He denies fever, chest pain, orthopnea or hemoptysis.   Pre-Op Assessment Scoring:   [place] Postperative Pulmonary Risk Score Comment Score  Type of surgery - abd ao aneurysm (27), thoracic (21), neurosurgery / upper abdominal / vascular (21), neck (11) 21 21  Emergency Surgery - (11) 0 0  ALbumin < 3 or poor nutritional state - (9) 0   BUN > 30 -  (8) 8 8  Partial or completely dependent functional status - (7) 0   COPD -  (6) 0   Age - 60 to 69 (4), > 70  (6) 4 4  TOTAL    Risk Stratifcation scores  - < 10, 11-19, 20-27, 28-40, >40 33 33   Intermediate high for prolonged mechanical ventilation after 5 days.   CANET Postperative Pulmonary Risk Score Comment Score  Age - <50 (0), 50-80 (3), >80 (16)  3  Preoperative pulse ox - >96 (0), 91-95 (8), <90 (24)  0  Respiratory infection in last month - Yes (17)  0  Preoperative anemia - < 10gm% - Yes (11)  0  Surgical incision - Upper abdominal (15), Thoracic (24)  15  Duration of surgery - <2h (0), 2-3h (16), >3h (23)  16  Emergency Surgery -  Yes (8)  0  TOTAL    Risk Stratification - Low (<26), Intermediate (26-44), High (>45)  34   Intermediate high risk for any pulmonary complications post operatively.  He clearly needs this mass to be removed. He is cleared for surgery with intermediate/ high risk. CXR 07/01/2016 is stable.  Major Pulmonary risks identified in the multifactorial risk analysis are but not limited to a) pneumonia; b) recurrent intubation risk; c) prolonged or recurrent acute respiratory failure needing mechanical ventilation; d) prolonged hospitalization; e) DVT/Pulmonary embolism; f) Acute Pulmonary edema  Recommend 1. Short duration of surgery as much as possible and avoid paralytic if possible 2. Recovery in step down or ICU with Pulmonary consultation 3. DVT prophylaxis 4. Aggressive pulmonary toilet with O2, bronchodilatation, and incentive spirometry and early ambulation    Tests  CXR 07/01/2016 FINDINGS: Cardiomediastinal silhouette is stable. Mild hyperinflation again noted. Stable linear scarring in right lower lobe laterally. Status post CABG. No infiltrate or pulmonary edema. Bony thorax is unremarkable.  IMPRESSION: No active cardiopulmonary disease. Mild hyperinflation. State post CABG. Stable scarring in right lower lobe laterally.  CT Abdomen Pelvis 06/15/2016 IMPRESSION: 1. Small bowel lesion is identified within the left upper quadrant the abdomen. Finding is worrisome for non obstructing small bowel neoplasm. 2. No evidence for metastatic adenopathy or distant metastatic disease. 3. Aortic atherosclerosis and infrarenal abdominal aortic aneurysm. Recommend  followup by ultrasound in 3 years   CXR 03/30/2016 FINDINGS: There is mild scarring in the right lower lobe region, stable. There is no edema or consolidation. Heart size and pulmonary vascularity are normal. No adenopathy. There is atherosclerotic calcification in the aorta. Patient is status post coronary artery bypass  grafting. There is degenerative change in the lower thoracic region. There old healed rib fractures on the right laterally.  IMPRESSION: Mild scarring right lower lobe region. No edema or consolidation. There is aortic atherosclerosis.  Past medical hx Past Medical History:  Diagnosis Date  . ALLERGIC RHINITIS   . Anemia   . Barrett's esophagus   . BOOP (bronchiolitis obliterans with organizing pneumonia) ([place])    a. s/p R VATS 2008.  [name] CAD (coronary artery disease)    a. 05/2000: NSTEMI/CABG x 6: LIMA->LAD, VG->D1, VG->OM1->2, VG->PDA->RPL;  b. 07/2007 MV: high lat infarct, no ischemia, EF 47%;  c. Cath/PCI: LM nl, LAD20p, 73m, D1 nl, D2 60-70ost, D3 nl, LCX 70ost, 161m, OM1/OM2 min irregs, RCA 30 diff, PDA 99, LIMA->LAD atretic, VG->D1 100, VG->OM1->2 100, VG->PDA->RPL 90p (4.0x23 Vision BMS);  c. 07/2012 Echo: EF 55%, gr1 DD.  [name] Cataract   . CKD (chronic kidney disease), stage III    a. renal bx 2008: GLN with vasculitis  . GERD (gastroesophageal reflux disease)   . Glaucoma    a. Cannot see out of L eye.  . Hematuria    Microscopic  . Hyperglycemia    Patient reported while on prednisone, had to take insulin  . Hyperlipemia   . Hypertension   . Hypothyroidism   . ILD (interstitial lung disease) ([place])   . Iritis   . Local infection of skin and subcutaneous tissue   . Membranoproliferative nephritis   . Myocardial infarction 2002  . Neutropenia, drug-induced ([place])   . Recurrent boils   . Residual foreign body in soft tissue   . Vasculitis ([place])    a.  pauciimmune vasculitis with renal involvement and hx of transient hemoptysis in the past with associated BOOP, 2008 (renal bx 2008: GLN with vasculitis). b. History of treatment with 2 cycles of Cytoxan and pheresis. H/o hemoptysis and pulm hemorrhage with 2nd cycle of cytoxan.     Past surgical hx, Family hx, Social hx all reviewed.  Current Outpatient Prescriptions on File Prior to Visit  Medication Sig  . aspirin EC 81  MG EC tablet Take 1 tablet (81 mg total) by mouth daily.  . clopidogrel (PLAVIX) 75 MG tablet Take 1 tablet (75 mg total) by mouth daily.  . finasteride (PROSCAR) 5 MG tablet Take 5 mg by mouth daily.  . furosemide (LASIX) 20 MG tablet Take 1 tablet (20 mg total) by mouth daily.  [name] levothyroxine (SYNTHROID, LEVOTHROID) 100 MCG tablet Take 100 mcg by mouth daily.    . metoprolol tartrate (LOPRESSOR) 25 MG tablet Take 0.5 tablets (12.5 mg total) by mouth 2 (two) times daily.  . Multiple Vitamin (MULTIVITAMIN) tablet Take 1 tablet by mouth daily.    [name] NITROSTAT 0.4 MG SL tablet PLACE 1 TABLET (0.4 MG TOTAL) UNDER THE TONGUE EVERY 5 (FIVE) MINUTES AS NEEDED. MAY REPEAT X3  . pantoprazole (PROTONIX) 40 MG tablet TAKE 1 TABLET (40 MG TOTAL) BY MOUTH DAILY.  . rosuvastatin (CRESTOR) 10 MG tablet Take 10 mg by mouth daily.  . Tamsulosin HCl (FLOMAX) 0.4 MG CAPS Take 0.4 mg by mouth daily.    [name] telmisartan (MICARDIS) 40 MG tablet Take 20 mg by  mouth daily.   [name] azaTHIOprine (IMURAN) 50 MG tablet TAKE 2 TABLETS(100MG ) BY MOUTH IN THE AM AND 1 TABLET(50MG ) BY MOUTH IN THE PM   Current Facility-Administered Medications on File Prior to Visit  Medication  . 0.9 %  sodium chloride infusion     No Known Allergies  Review Of Systems:  Constitutional:   No  weight loss, night sweats,  Fevers, chills, fatigue, or  lassitude.  HEENT:   No headaches,  Difficulty swallowing,  Tooth/dental problems, or  Sore throat,                No sneezing, itching, ear ache, nasal congestion, post nasal drip,   CV:  No chest pain,  Orthopnea, PND, swelling in lower extremities, anasarca, dizziness, palpitations, syncope.   GI  No heartburn, indigestion, abdominal pain, nausea, vomiting, diarrhea, change in bowel habits, loss of appetite, bloody stools.   Resp: + shortness of breath climbing stairs ( Baseline)  And with exertion or at rest.  No excess mucus, no productive cough,  No non-productive cough,  No coughing up  of blood.  No change in color of mucus.  No wheezing.  No chest wall deformity  Skin: no rash or lesions.  GU: no dysuria, change in color of urine, no urgency or frequency.  No flank pain, no hematuria   MS:  No joint pain or swelling.  No decreased range of motion.  No back pain.  Psych:  No change in mood or affect. No depression or anxiety.  No memory loss.   Vital Signs BP 102/62 (BP Location: Right Arm, Patient Position: Sitting, Cuff Size: Normal)   Pulse 92   Ht 6\' 1"  (1.854 m)   Wt 232 lb 6.4 oz (105.4 kg)   SpO2 96%   BMI 30.66 kg/m    Physical Exam:  General- No distress,  A&Ox3, pleasant ENT: No sinus tenderness, TM clear, pale nasal mucosa, no oral exudate,no post nasal drip, no LAN Cardiac: S1, S2, regular rate and rhythm, no murmur Chest: No wheeze/ rales/ dullness; no accessory muscle use, no nasal flaring, no sternal retractions Abd.: Soft Non-tender, obese Ext: No clubbing cyanosis, edema Neuro:  normal strength Skin: No rashes, warm and dry Psych: normal mood and behavior, slightly flat affect   Assessment/Plan  INTERSTITIAL LUNG DISEASE Surgical clearance for removal of small bowel lesion in LUQ of abdomen, worrisome for non-obstructing small bowel neoplasm. Plan: Cleared for surgery from pulmonary standpoint Moderate/High risk for pulmonary complications post operatively. ( See Risk Score Tools) Remember to be diligant about getting out of bed and coughing and deep breathing after surgery. You are at increased risk of pulmonary complications due to your underlying vasculitis. Continued Follow UP with Dr. Vanita Panda in patient and out patient. Call us if you develop shortness of breath, cough or any change in sputum. Follow up with Dr. Lake Bells in 3 months post op to assure you are doing well. Recommendations to surgery per note 07/01/2016. Recommend 1. Short duration of surgery as much as possible and avoid paralytic if possible 2. Recovery in step  down or ICU with Pulmonary consultation 3. DVT prophylaxis 4. Aggressive pulmonary toilet with O2, bronchodilatation, and incentive spirometry and early ambulation  Please contact office for sooner follow up if symptoms do not improve or worsen or seek emergency care      Magdalen Spatz, NP 07/01/2016  2:20 PM

## 2016-07-02 NOTE — Progress Notes (Signed)
reviewed

## 2016-07-05 NOTE — Telephone Encounter (Signed)
Called patient back and let him know that Dr. [name] has cleared him from a cardiac perceptive. Informed patient that he needs to also talk to his pulmonologist as well. Patient verbalized understanding.

## 2016-07-05 NOTE — Telephone Encounter (Signed)
Follow up    Pt verbalized that he wants to speak to rn about lexiscan clearance

## 2016-07-06 ENCOUNTER — Other Ambulatory Visit: Payer: Self-pay | Admitting: General Surgery

## 2016-07-08 NOTE — Pre-Procedure Instructions (Addendum)
Luke Mcbride  07/08/2016      [id number] - [place] [address], [place] [address] [place] [address] Phone: [phone] Fax: [place], [address] [place] [address] Phone: [phone] Fax: [phone]    Your procedure is scheduled on Monday, February 26.  Report to [facility] Admitting at 10:00 AM                Your surgery or procedure is scheduled for 12:00 Noon   Call this number if you have problems the morning of [phone]               For any other questions, please call [phone], Monday - Friday 8 AM - 4 PM.   Remember:  Do not eat food or drink liquids after midnight Sunday, February 25.  Take these medicines the morning of surgery with A SIP OF WATER:  levothyroxine (SYNTHROID, LEVOTHROID), metoprolol tartrate (LOPRESSOR),  pantoprazole (PROTONIX).               Take if needed: acetaminophen (TYLENOL), NITROSTAT.               Stop Plavix as instructed by Dr Tyson Alias.  STOP taking Aspirin , Aspirin Products (Goody Powder, Excedrin Migraine), Ibuprofen (Advil), Naproxen (Aleve), Viatiams and Herbal Products (ie Fish Oil).               [facility]- Preparing For Surgery  Before surgery, you can play an important role. Because skin is not sterile, your skin needs to be as free of germs as possible. You can reduce the number of germs on your skin by washing with CHG (chlorahexidine gluconate) Soap before surgery.  CHG is an antiseptic cleaner which kills germs and bonds with the skin to continue killing germs even after washing.  Please do not use if you have an allergy to CHG or antibacterial soaps. If your skin becomes reddened/irritated stop using the CHG.  Do not shave (including legs and underarms) for at least 48 hours prior to first CHG shower. It is OK to shave your face.  Please follow these instructions  carefully.   1. Shower the NIGHT BEFORE SURGERY and the MORNING OF SURGERY with CHG.   2. If you chose to wash your hair, wash your hair first as usual with your normal shampoo.  3. After you shampoo, rinse your hair and body thoroughly to remove the shampoo.  4. Use CHG as you would any other liquid soap. You can apply CHG directly to the skin and wash gently with a scrungie or a clean washcloth.   5. Apply the CHG Soap to your body ONLY FROM THE NECK DOWN.  Do not use on open wounds or open sores. Avoid contact with your eyes, ears, mouth and genitals (private parts). Wash genitals (private parts) with your normal soap.  6. Wash thoroughly, paying special attention to the area where your surgery will be performed.  7. Thoroughly rinse your body with warm water from the neck down.  8. DO NOT shower/wash with your normal soap after using and rinsing off the CHG Soap.  9. Pat yourself dry with a CLEAN TOWEL.   10. Wear CLEAN PAJAMAS   11. Place CLEAN SHEETS on your bed the night of your first shower and DO NOT SLEEP WITH PETS.  Day of Surgery: Do not apply any deodorants/lotions.  Please wear clean clothes to the hospital/surgery center.      Do not wear jewelry, make-up or nail polish.  Do not wear lotions, powders, or perfumes, or deodorant.  Do not shave 48 hours prior to surgery.  Men may shave face and neck.  Do not bring valuables to the hospital.  [facility] is not responsible for any belongings or valuables.  Contacts, dentures or bridgework may not be worn into surgery.  Leave your suitcase in the car.  After surgery it may be brought to your room.  For patients admitted to the hospital, discharge time will be determined by your treatment team.  Patients discharged the day of surgery will not be allowed to drive home.   Special instructions:  -  Please read over the fact sheets that you were given.

## 2016-07-09 ENCOUNTER — Encounter (HOSPITAL_COMMUNITY)
Admission: RE | Admit: 2016-07-09 | Discharge: 2016-07-09 | Disposition: A | Discharge status: Home / Self Care | Payer: Medicare Other | Source: Ambulatory Visit | Attending: General Surgery | Admitting: General Surgery

## 2016-07-09 ENCOUNTER — Encounter (HOSPITAL_COMMUNITY): Payer: Self-pay

## 2016-07-09 DIAGNOSIS — Z7982 Long term (current) use of aspirin: Secondary | ICD-10-CM | POA: Insufficient documentation

## 2016-07-09 DIAGNOSIS — K6389 Other specified diseases of intestine: Secondary | ICD-10-CM

## 2016-07-09 DIAGNOSIS — I252 Old myocardial infarction: Secondary | ICD-10-CM

## 2016-07-09 DIAGNOSIS — Z79899 Other long term (current) drug therapy: Secondary | ICD-10-CM

## 2016-07-09 DIAGNOSIS — Z7902 Long term (current) use of antithrombotics/antiplatelets: Secondary | ICD-10-CM | POA: Insufficient documentation

## 2016-07-09 DIAGNOSIS — Z01818 Encounter for other preprocedural examination: Secondary | ICD-10-CM | POA: Insufficient documentation

## 2016-07-09 LAB — BASIC METABOLIC PANEL
Anion gap: 12 (ref 5–15)
BUN: 36 mg/dL — AB (ref 6–20)
CHLORIDE: 104 mmol/L (ref 101–111)
CO2: 21 mmol/L — AB (ref 22–32)
CREATININE: 1.96 mg/dL — AB (ref 0.61–1.24)
Calcium: 9.2 mg/dL (ref 8.9–10.3)
GFR calc Af Amer: 36 mL/min — ABNORMAL LOW (ref >60)
GFR calc non Af Amer: 31 mL/min — ABNORMAL LOW (ref >60)
Glucose, Bld: 116 mg/dL — ABNORMAL HIGH (ref 65–99)
Potassium: 5.1 mmol/L (ref 3.5–5.1)
Sodium: 137 mmol/L (ref 135–145)

## 2016-07-09 LAB — CBC
HEMATOCRIT: 35.8 % — AB (ref 39.0–52.0)
HEMOGLOBIN: 11.5 g/dL — AB (ref 13.0–17.0)
MCH: 33 pg (ref 26.0–34.0)
MCHC: 32.1 g/dL (ref 30.0–36.0)
MCV: 102.6 fL — ABNORMAL HIGH (ref 78.0–100.0)
Platelets: 204 10*3/uL (ref 150–400)
RBC: 3.49 MIL/uL — ABNORMAL LOW (ref 4.22–5.81)
RDW: 16.6 % — ABNORMAL HIGH (ref 11.5–15.5)
WBC: 4.9 10*3/uL (ref 4.0–10.5)

## 2016-07-09 LAB — PROTIME-INR
INR: 1.01
PROTHROMBIN TIME: 13.3 s (ref 11.4–15.2)

## 2016-07-09 NOTE — Progress Notes (Signed)
Anesthesia Chart Review: Patient is a 81 year old male scheduled for diagnostic laparoscopy, small bowel resection on 07/13/15 by Dr. Donne Hazel. PAT was on Friday 07/09/16. Chart was not brought to me to review until after 7 PM on Friday.  History includes never smoker, HTN, hypothyroidism, CAD/NSTEMI s/p CABG (LIMA-LAD, SVG-DIAG1, SVG-OM1-OM2, SVG-PDA-RPL) 05/2000 and NSTEMI s/p BMS SVG-PDA-RPL 07/2012, HLD, iritis, GERD, vasculitis with renal involvement and pulmonary hemorrhage (pauci-immune GN; s/p Cytoxan and plasmapheresis) with CKD stage III, drug-induced neutropenia, ILD, BOOP (s/p right VATS '08), glaucoma (blind left eye), anemia, toe amputatinon '09.  - Cardiologist is Dr. [name]. He felt patient should be okay for surgery following recent stress test. He recommended pulmonology input as well.  - Pulmonologist is Dr. Lake Bells. Seen by Eric Form, NP on 07/01/16 for pulmonary clearance. Recommendations as outlined: Cleared for surgery from pulmonary standpoint Moderate/High risk for pulmonary complications post operatively. ( See Risk Score Tools) Remember to be diligant about getting out of bed and coughing and deep breathing after surgery. You are at increased risk of pulmonary complications due to your underlying vasculitis. Continued Follow UP with Dr. Vanita Panda in patient and out patient. Call us if you develop shortness of breath, cough or any change in sputum. Follow up with Dr. Lake Bells in 3 months post op to assure you are doing well. Recommendations to surgery per note 07/01/2016. Recommend 1. Short duration of surgery as much as possible and avoid paralytic if possible 2. Recovery in step down or ICU with Pulmonary consultation 3. DVT prophylaxis 4. Aggressive pulmonary toilet with O2, bronchodilatation, and incentive spirometry and early ambulation  - Nephrologist is Dr. Marval Regal, last visit 06/11/16. - GI is Dr. Havery Moros.  Meds include ASA 81 mg, Imuran (on hold 07/07/16),  Plavix (on hold 07/07/16), Proscar, Lasix, levothyroxine, Lopressor, Nitro, Protonix, Crestor, Flomax, telmisartan.  BP 101/63   Pulse 87   Temp 36.4 C   Resp 20   Ht 6' (1.829 m)   Wt 230 lb 14.4 oz (104.7 kg)   SpO2 95%   BMI 31.32 kg/m    Nuclear stress test 06/30/16:  The left ventricular ejection fraction is moderately decreased (30-44%).  Nuclear stress EF: 40%.  There was no ST segment deviation noted during stress.  No T wave inversion was noted during stress.  Defect 1: There is a large defect of severe severity present in the basal inferolateral, basal anterolateral, mid inferolateral and mid anterolateral location.  Findings consistent with prior myocardial infarction.  This is an intermediate risk study.  Intermediate risk stress nuclear study with a large scar in the left circumflex coronary artery distribution moderate reduction in left ventricular systolic function.  Echo 07/27/12: Impressions: - Normal LV size with mild LV hypertrophy. EF 55%. Mildly dilated RV with normal systolic function. No significant valvular abnormalities.  Cardiac cath 07/26/12: Final Conclusions:  1. Significant 2 vessel coronary artery disease. 2. Atretic LIMA to LAD. However, the native LAD has only moderate nonobstructive disease.  3. Occluded SVG to diagonal and SVG to OMs which are likely chronic. 4. Significant disease in proximal SVG to right PDA/PL. 5. Successful bare-metal stent placement the SVG to right PDA/PL using distal protection device. 6. Moderately elevated left ventricular end-diastolic pressure. Recommendations:  Monitor renal function. Will gently hydrate. Obtain an echocardiogram to evaluate LV systolic function. Continue serial cardiac enzymes. The occlusion of SVG to diagonal and OM's are likely chronic. Continue dual antiplatelet therapy for a minimal of one month.  CXR 07/01/16: IMPRESSION: No  active cardiopulmonary disease. Mild hyperinflation. State  post CABG. Stable scarring in right lower lobe laterally.  Preoperative labs noted. K 5.1. Glucose 116. BUN 36. Cr 1.96. This is consistent with last labs from CKD (BUN 50, Cr 2.26). H/H 11.5/35.8. PLT 204.   If no acute changes then I anticipate that he can proceed as planned.  [facility]Anesthesiology Phone [phone] 07/09/2016 7:12 PM

## 2016-07-12 ENCOUNTER — Inpatient Hospital Stay (HOSPITAL_COMMUNITY)
Admission: RE | Admit: 2016-07-12 | Discharge: 2016-07-16 | DRG: 820 | Disposition: A | Discharge status: Home / Self Care | Payer: Medicare Other | Source: Ambulatory Visit | Attending: General Surgery | Admitting: General Surgery

## 2016-07-12 ENCOUNTER — Encounter (HOSPITAL_COMMUNITY)
Admission: RE | Disposition: A | Discharge status: Home / Self Care | Payer: Self-pay | Source: Ambulatory Visit | Attending: General Surgery

## 2016-07-12 ENCOUNTER — Inpatient Hospital Stay (HOSPITAL_COMMUNITY): Payer: Medicare Other | Admitting: Anesthesiology

## 2016-07-12 ENCOUNTER — Encounter (HOSPITAL_COMMUNITY): Payer: Self-pay | Admitting: *Deleted

## 2016-07-12 ENCOUNTER — Inpatient Hospital Stay (HOSPITAL_COMMUNITY): Payer: Medicare Other | Admitting: Vascular Surgery

## 2016-07-12 DIAGNOSIS — E7800 Pure hypercholesterolemia, unspecified: Secondary | ICD-10-CM | POA: Diagnosis present

## 2016-07-12 DIAGNOSIS — I776 Arteritis, unspecified: Secondary | ICD-10-CM | POA: Diagnosis present

## 2016-07-12 DIAGNOSIS — Z79899 Other long term (current) drug therapy: Secondary | ICD-10-CM

## 2016-07-12 DIAGNOSIS — Z6829 Body mass index (BMI) 29.0-29.9, adult: Secondary | ICD-10-CM

## 2016-07-12 DIAGNOSIS — I129 Hypertensive chronic kidney disease with stage 1 through stage 4 chronic kidney disease, or unspecified chronic kidney disease: Secondary | ICD-10-CM | POA: Diagnosis present

## 2016-07-12 DIAGNOSIS — E785 Hyperlipidemia, unspecified: Secondary | ICD-10-CM | POA: Diagnosis present

## 2016-07-12 DIAGNOSIS — I10 Essential (primary) hypertension: Secondary | ICD-10-CM | POA: Diagnosis not present

## 2016-07-12 DIAGNOSIS — C8590 Non-Hodgkin lymphoma, unspecified, unspecified site: Principal | ICD-10-CM | POA: Diagnosis present

## 2016-07-12 DIAGNOSIS — H409 Unspecified glaucoma: Secondary | ICD-10-CM | POA: Diagnosis present

## 2016-07-12 DIAGNOSIS — K6389 Other specified diseases of intestine: Secondary | ICD-10-CM | POA: Diagnosis not present

## 2016-07-12 DIAGNOSIS — Z955 Presence of coronary angioplasty implant and graft: Secondary | ICD-10-CM

## 2016-07-12 DIAGNOSIS — E669 Obesity, unspecified: Secondary | ICD-10-CM | POA: Diagnosis present

## 2016-07-12 DIAGNOSIS — H5440 Blindness, one eye, unspecified eye: Secondary | ICD-10-CM | POA: Diagnosis present

## 2016-07-12 DIAGNOSIS — Z833 Family history of diabetes mellitus: Secondary | ICD-10-CM

## 2016-07-12 DIAGNOSIS — R1010 Upper abdominal pain, unspecified: Secondary | ICD-10-CM | POA: Diagnosis not present

## 2016-07-12 DIAGNOSIS — I252 Old myocardial infarction: Secondary | ICD-10-CM

## 2016-07-12 DIAGNOSIS — Z9842 Cataract extraction status, left eye: Secondary | ICD-10-CM | POA: Diagnosis not present

## 2016-07-12 DIAGNOSIS — J449 Chronic obstructive pulmonary disease, unspecified: Secondary | ICD-10-CM | POA: Diagnosis present

## 2016-07-12 DIAGNOSIS — Z89429 Acquired absence of other toe(s), unspecified side: Secondary | ICD-10-CM | POA: Diagnosis not present

## 2016-07-12 DIAGNOSIS — Z8249 Family history of ischemic heart disease and other diseases of the circulatory system: Secondary | ICD-10-CM

## 2016-07-12 DIAGNOSIS — N183 Chronic kidney disease, stage 3 (moderate): Secondary | ICD-10-CM | POA: Diagnosis present

## 2016-07-12 DIAGNOSIS — K9189 Other postprocedural complications and disorders of digestive system: Secondary | ICD-10-CM

## 2016-07-12 DIAGNOSIS — C8333 Diffuse large B-cell lymphoma, intra-abdominal lymph nodes: Secondary | ICD-10-CM | POA: Diagnosis not present

## 2016-07-12 DIAGNOSIS — E1122 Type 2 diabetes mellitus with diabetic chronic kidney disease: Secondary | ICD-10-CM | POA: Diagnosis present

## 2016-07-12 DIAGNOSIS — J8489 Other specified interstitial pulmonary diseases: Secondary | ICD-10-CM | POA: Diagnosis present

## 2016-07-12 DIAGNOSIS — K429 Umbilical hernia without obstruction or gangrene: Secondary | ICD-10-CM | POA: Diagnosis present

## 2016-07-12 DIAGNOSIS — J9811 Atelectasis: Secondary | ICD-10-CM | POA: Diagnosis not present

## 2016-07-12 DIAGNOSIS — Z9049 Acquired absence of other specified parts of digestive tract: Secondary | ICD-10-CM

## 2016-07-12 DIAGNOSIS — Z951 Presence of aortocoronary bypass graft: Secondary | ICD-10-CM

## 2016-07-12 DIAGNOSIS — I2510 Atherosclerotic heart disease of native coronary artery without angina pectoris: Secondary | ICD-10-CM | POA: Diagnosis present

## 2016-07-12 DIAGNOSIS — K639 Disease of intestine, unspecified: Secondary | ICD-10-CM | POA: Diagnosis present

## 2016-07-12 DIAGNOSIS — K219 Gastro-esophageal reflux disease without esophagitis: Secondary | ICD-10-CM | POA: Diagnosis present

## 2016-07-12 DIAGNOSIS — K567 Ileus, unspecified: Secondary | ICD-10-CM | POA: Diagnosis not present

## 2016-07-12 DIAGNOSIS — E039 Hypothyroidism, unspecified: Secondary | ICD-10-CM | POA: Diagnosis present

## 2016-07-12 DIAGNOSIS — J9601 Acute respiratory failure with hypoxia: Secondary | ICD-10-CM | POA: Diagnosis not present

## 2016-07-12 DIAGNOSIS — Z9841 Cataract extraction status, right eye: Secondary | ICD-10-CM

## 2016-07-12 DIAGNOSIS — C8339 Diffuse large B-cell lymphoma, extranodal and solid organ sites: Secondary | ICD-10-CM | POA: Diagnosis not present

## 2016-07-12 HISTORY — PX: LAPAROSCOPY: SHX197

## 2016-07-12 HISTORY — PX: BOWEL RESECTION: SHX1257

## 2016-07-12 LAB — BASIC METABOLIC PANEL
ANION GAP: 9 (ref 5–15)
BUN: 32 mg/dL — ABNORMAL HIGH (ref 6–20)
CALCIUM: 9.2 mg/dL (ref 8.9–10.3)
CO2: 21 mmol/L — AB (ref 22–32)
CREATININE: 2.03 mg/dL — AB (ref 0.61–1.24)
Chloride: 108 mmol/L (ref 101–111)
GFR, EST AFRICAN AMERICAN: 34 mL/min — AB (ref >60)
GFR, EST NON AFRICAN AMERICAN: 30 mL/min — AB (ref >60)
GLUCOSE: 108 mg/dL — AB (ref 65–99)
Potassium: 4.3 mmol/L (ref 3.5–5.1)
Sodium: 138 mmol/L (ref 135–145)

## 2016-07-12 LAB — TYPE AND SCREEN
ABO/RH(D): A POS
Antibody Screen: NEGATIVE

## 2016-07-12 SURGERY — LAPAROSCOPY, DIAGNOSTIC
Anesthesia: General | Site: Abdomen

## 2016-07-12 MED ORDER — SODIUM CHLORIDE 0.9 % IV SOLN
INTRAVENOUS | Status: DC
  Administered 2016-07-13 – 2016-07-15 (×3): via INTRAVENOUS

## 2016-07-12 MED ORDER — LEVOTHYROXINE SODIUM 100 MCG PO TABS
100.0000 ug | ORAL_TABLET | Freq: Every day | ORAL | Status: DC
  Administered 2016-07-13 – 2016-07-16 (×4): 100 ug via ORAL
  Filled 2016-07-12 (×4): qty 1

## 2016-07-12 MED ORDER — NEOSTIGMINE METHYLSULFATE 10 MG/10ML IV SOLN
INTRAVENOUS | Status: DC | PRN
  Administered 2016-07-12: 3 mg via INTRAVENOUS

## 2016-07-12 MED ORDER — PROMETHAZINE HCL 25 MG/ML IJ SOLN: 6.2500 mg | INTRAMUSCULAR | Status: DC | PRN

## 2016-07-12 MED ORDER — PROPOFOL 10 MG/ML IV BOLUS: INTRAVENOUS | Status: DC | PRN

## 2016-07-12 MED ORDER — FENTANYL CITRATE (PF) 100 MCG/2ML IJ SOLN
INTRAMUSCULAR | Status: AC
  Filled 2016-07-12: qty 4

## 2016-07-12 MED ORDER — HYDROMORPHONE HCL 1 MG/ML IJ SOLN
1.0000 mg | INTRAMUSCULAR | Status: DC | PRN
  Administered 2016-07-12: 0.5 mg via INTRAVENOUS
  Administered 2016-07-12 – 2016-07-13 (×4): 1 mg via INTRAVENOUS
  Filled 2016-07-12 (×4): qty 1

## 2016-07-12 MED ORDER — MEPERIDINE HCL 25 MG/ML IJ SOLN: 6.2500 mg | INTRAMUSCULAR | Status: DC | PRN

## 2016-07-12 MED ORDER — FENTANYL CITRATE (PF) 100 MCG/2ML IJ SOLN
INTRAMUSCULAR | Status: DC | PRN
  Administered 2016-07-12: 100 ug via INTRAVENOUS

## 2016-07-12 MED ORDER — FENTANYL CITRATE (PF) 100 MCG/2ML IJ SOLN: INTRAMUSCULAR | Status: DC | PRN

## 2016-07-12 MED ORDER — CEFAZOLIN SODIUM-DEXTROSE 2-4 GM/100ML-% IV SOLN
2.0000 g | INTRAVENOUS | Status: AC
  Administered 2016-07-12: 2 g via INTRAVENOUS
  Filled 2016-07-12: qty 100

## 2016-07-12 MED ORDER — METOPROLOL TARTRATE 12.5 MG HALF TABLET
12.5000 mg | ORAL_TABLET | Freq: Two times a day (BID) | ORAL | Status: DC
  Administered 2016-07-12 – 2016-07-16 (×8): 12.5 mg via ORAL
  Filled 2016-07-12 (×8): qty 1

## 2016-07-12 MED ORDER — GLYCOPYRROLATE 0.2 MG/ML IJ SOLN
INTRAMUSCULAR | Status: DC | PRN
  Administered 2016-07-12: 0.4 mg via INTRAVENOUS

## 2016-07-12 MED ORDER — HYDROMORPHONE HCL 1 MG/ML IJ SOLN
0.2500 mg | INTRAMUSCULAR | Status: DC | PRN
  Administered 2016-07-12 (×3): 0.5 mg via INTRAVENOUS

## 2016-07-12 MED ORDER — FENTANYL CITRATE (PF) 100 MCG/2ML IJ SOLN
25.0000 ug | INTRAMUSCULAR | Status: DC | PRN
Start: 2016-07-12 — End: 2016-07-12

## 2016-07-12 MED ORDER — PHENYLEPHRINE HCL 10 MG/ML IJ SOLN
INTRAVENOUS | Status: DC | PRN
  Administered 2016-07-12: 10 ug/min via INTRAVENOUS

## 2016-07-12 MED ORDER — ONDANSETRON HCL 4 MG/2ML IJ SOLN
4.0000 mg | Freq: Four times a day (QID) | INTRAMUSCULAR | Status: DC | PRN
  Administered 2016-07-13: 4 mg via INTRAVENOUS
  Filled 2016-07-12: qty 2

## 2016-07-12 MED ORDER — PANTOPRAZOLE SODIUM 40 MG PO TBEC
40.0000 mg | DELAYED_RELEASE_TABLET | Freq: Every day | ORAL | Status: DC
  Administered 2016-07-13 – 2016-07-16 (×4): 40 mg via ORAL
  Filled 2016-07-12 (×4): qty 1

## 2016-07-12 MED ORDER — CLOPIDOGREL BISULFATE 75 MG PO TABS
75.0000 mg | ORAL_TABLET | Freq: Every day | ORAL | Status: DC
  Administered 2016-07-13 – 2016-07-16 (×4): 75 mg via ORAL
  Filled 2016-07-12 (×4): qty 1

## 2016-07-12 MED ORDER — CHLORHEXIDINE GLUCONATE CLOTH 2 % EX PADS: 6.0000 | MEDICATED_PAD | Freq: Once | CUTANEOUS | Status: DC

## 2016-07-12 MED ORDER — PROPOFOL 10 MG/ML IV BOLUS
INTRAVENOUS | Status: AC
  Filled 2016-07-12: qty 20

## 2016-07-12 MED ORDER — LIDOCAINE HCL (CARDIAC) 20 MG/ML IV SOLN
INTRAVENOUS | Status: DC | PRN
  Administered 2016-07-12: 30 mg via INTRAVENOUS

## 2016-07-12 MED ORDER — ASPIRIN EC 81 MG PO TBEC
81.0000 mg | DELAYED_RELEASE_TABLET | Freq: Every day | ORAL | Status: DC
  Administered 2016-07-13 – 2016-07-16 (×4): 81 mg via ORAL
  Filled 2016-07-12 (×4): qty 1

## 2016-07-12 MED ORDER — ONDANSETRON HCL 4 MG/2ML IJ SOLN
INTRAMUSCULAR | Status: DC | PRN
  Administered 2016-07-12: 4 mg via INTRAVENOUS

## 2016-07-12 MED ORDER — NITROGLYCERIN 0.4 MG SL SUBL: 0.4000 mg | SUBLINGUAL_TABLET | SUBLINGUAL | Status: DC | PRN

## 2016-07-12 MED ORDER — ROCURONIUM BROMIDE 100 MG/10ML IV SOLN
INTRAVENOUS | Status: DC | PRN
  Administered 2016-07-12: 50 mg via INTRAVENOUS

## 2016-07-12 MED ORDER — 0.9 % SODIUM CHLORIDE (POUR BTL) OPTIME
TOPICAL | Status: DC | PRN
  Administered 2016-07-12 (×2): 1000 mL

## 2016-07-12 MED ORDER — ACETAMINOPHEN 325 MG PO TABS: 650.0000 mg | ORAL_TABLET | Freq: Four times a day (QID) | ORAL | Status: DC | PRN

## 2016-07-12 MED ORDER — LIDOCAINE HCL (CARDIAC) 20 MG/ML IV SOLN: INTRAVENOUS | Status: DC | PRN

## 2016-07-12 MED ORDER — ACETAMINOPHEN 650 MG RE SUPP: 650.0000 mg | Freq: Four times a day (QID) | RECTAL | Status: DC | PRN

## 2016-07-12 MED ORDER — SODIUM CHLORIDE 0.9 % IV SOLN
INTRAVENOUS | Status: DC
  Administered 2016-07-12: 10:00:00 via INTRAVENOUS

## 2016-07-12 MED ORDER — OXYCODONE HCL 5 MG PO TABS
5.0000 mg | ORAL_TABLET | ORAL | Status: DC | PRN
  Administered 2016-07-12 – 2016-07-15 (×9): 10 mg via ORAL
  Filled 2016-07-12 (×9): qty 2

## 2016-07-12 MED ORDER — FINASTERIDE 5 MG PO TABS
5.0000 mg | ORAL_TABLET | Freq: Every day | ORAL | Status: DC
  Administered 2016-07-12 – 2016-07-15 (×4): 5 mg via ORAL
  Filled 2016-07-12 (×4): qty 1

## 2016-07-12 MED ORDER — LACTATED RINGERS IV SOLN
INTRAVENOUS | Status: DC | PRN
  Administered 2016-07-12: 12:00:00 via INTRAVENOUS

## 2016-07-12 MED ORDER — SIMETHICONE 80 MG PO CHEW
40.0000 mg | CHEWABLE_TABLET | Freq: Four times a day (QID) | ORAL | Status: DC | PRN
  Administered 2016-07-15: 40 mg via ORAL
  Filled 2016-07-12: qty 1

## 2016-07-12 MED ORDER — PROPOFOL 10 MG/ML IV BOLUS
INTRAVENOUS | Status: DC | PRN
  Administered 2016-07-12: 100 mg via INTRAVENOUS

## 2016-07-12 MED ORDER — ROCURONIUM BROMIDE 100 MG/10ML IV SOLN: INTRAVENOUS | Status: DC | PRN

## 2016-07-12 MED ORDER — BUPIVACAINE HCL (PF) 0.25 % IJ SOLN
INTRAMUSCULAR | Status: AC
  Filled 2016-07-12: qty 30

## 2016-07-12 MED ORDER — HYDROMORPHONE HCL 2 MG/ML IJ SOLN
INTRAMUSCULAR | Status: AC
  Filled 2016-07-12: qty 1

## 2016-07-12 MED ORDER — MIDAZOLAM HCL 2 MG/2ML IJ SOLN: 0.5000 mg | Freq: Once | INTRAMUSCULAR | Status: DC | PRN

## 2016-07-12 MED ORDER — METHOCARBAMOL 500 MG PO TABS
500.0000 mg | ORAL_TABLET | Freq: Four times a day (QID) | ORAL | Status: DC | PRN
  Administered 2016-07-12 – 2016-07-14 (×6): 500 mg via ORAL
  Filled 2016-07-12 (×6): qty 1

## 2016-07-12 MED ORDER — BUPIVACAINE HCL (PF) 0.25 % IJ SOLN
INTRAMUSCULAR | Status: DC | PRN
  Administered 2016-07-12: 4 mL

## 2016-07-12 MED ORDER — ONDANSETRON 4 MG PO TBDP
4.0000 mg | ORAL_TABLET | Freq: Four times a day (QID) | ORAL | Status: DC | PRN
  Filled 2016-07-12: qty 1

## 2016-07-12 MED ORDER — TAMSULOSIN HCL 0.4 MG PO CAPS
0.4000 mg | ORAL_CAPSULE | Freq: Every day | ORAL | Status: DC
  Administered 2016-07-12 – 2016-07-15 (×4): 0.4 mg via ORAL
  Filled 2016-07-12 (×4): qty 1

## 2016-07-12 SURGICAL SUPPLY — 53 items
BLADE CLIPPER SURG (BLADE) ×1 IMPLANT
CANISTER SUCT 3000ML PPV (MISCELLANEOUS) ×2 IMPLANT
CELLS DAT CNTRL 66122 CELL SVR (MISCELLANEOUS) ×1 IMPLANT
CHLORAPREP W/TINT 26ML (MISCELLANEOUS) ×2 IMPLANT
COVER SURGICAL LIGHT HANDLE (MISCELLANEOUS) ×2 IMPLANT
DRAPE WARM FLUID 44X44 (DRAPE) ×2 IMPLANT
DRSG OPSITE POSTOP 4X6 (GAUZE/BANDAGES/DRESSINGS) ×1 IMPLANT
DRSG TEGADERM 2-3/8X2-3/4 SM (GAUZE/BANDAGES/DRESSINGS) ×2 IMPLANT
ELECT BLADE 6.5 EXT (BLADE) ×1 IMPLANT
ELECT CAUTERY BLADE 6.4 (BLADE) ×1 IMPLANT
ELECT REM PT RETURN 9FT ADLT (ELECTROSURGICAL) ×2
ELECTRODE REM PT RTRN 9FT ADLT (ELECTROSURGICAL) ×1 IMPLANT
GAUZE SPONGE 2X2 8PLY STRL LF (GAUZE/BANDAGES/DRESSINGS) IMPLANT
GLOVE BIO SURGEON STRL SZ7 (GLOVE) ×2 IMPLANT
GLOVE BIOGEL PI IND STRL 7.0 (GLOVE) IMPLANT
GLOVE BIOGEL PI IND STRL 7.5 (GLOVE) ×1 IMPLANT
GLOVE BIOGEL PI INDICATOR 7.0 (GLOVE) ×3
GLOVE BIOGEL PI INDICATOR 7.5 (GLOVE) ×3
GLOVE ECLIPSE 7.5 STRL STRAW (GLOVE) ×1 IMPLANT
GOWN STRL REUS W/ TWL LRG LVL3 (GOWN DISPOSABLE) ×3 IMPLANT
GOWN STRL REUS W/TWL LRG LVL3 (GOWN DISPOSABLE) ×8
KIT BASIN OR (CUSTOM PROCEDURE TRAY) ×2 IMPLANT
KIT ROOM TURNOVER OR (KITS) ×2 IMPLANT
LIGASURE IMPACT 36 18CM CVD LR (INSTRUMENTS) ×1 IMPLANT
NS IRRIG 1000ML POUR BTL (IV SOLUTION) ×4 IMPLANT
PAD ARMBOARD 7.5X6 YLW CONV (MISCELLANEOUS) ×2 IMPLANT
RELOAD PROXIMATE 75MM BLUE (ENDOMECHANICALS) ×4 IMPLANT
RELOAD STAPLE 75 3.8 BLU REG (ENDOMECHANICALS) IMPLANT
RETRACTOR WND ALEXIS 18 MED (MISCELLANEOUS) IMPLANT
RTRCTR WOUND ALEXIS 18CM MED (MISCELLANEOUS) ×2
SCISSORS LAP 5X35 DISP (ENDOMECHANICALS) ×1 IMPLANT
SLEEVE ENDOPATH XCEL 5M (ENDOMECHANICALS) ×3 IMPLANT
SPECIMEN JAR LARGE (MISCELLANEOUS) IMPLANT
SPONGE GAUZE 2X2 STER 10/PKG (GAUZE/BANDAGES/DRESSINGS) ×1
SPONGE LAP 18X18 X RAY DECT (DISPOSABLE) ×1 IMPLANT
STAPLER GUN LINEAR PROX 60 (STAPLE) ×1 IMPLANT
STAPLER PROXIMATE 75MM BLUE (STAPLE) ×1 IMPLANT
STAPLER VISISTAT (STAPLE) ×1 IMPLANT
SUCTION POOLE TIP (SUCTIONS) ×2 IMPLANT
SUT PDS AB 1 TP1 96 (SUTURE) ×2 IMPLANT
SUT SILK 2 0 (SUTURE) ×2
SUT SILK 2 0 SH CR/8 (SUTURE) ×2 IMPLANT
SUT SILK 2-0 18XBRD TIE 12 (SUTURE) ×1 IMPLANT
SUT SILK 3 0 (SUTURE) ×2
SUT SILK 3 0 SH CR/8 (SUTURE) ×3 IMPLANT
SUT SILK 3-0 18XBRD TIE 12 (SUTURE) ×1 IMPLANT
TOWEL OR 17X26 10 PK STRL BLUE (TOWEL DISPOSABLE) ×2 IMPLANT
TRAY FOLEY CATH 14FRSI W/METER (CATHETERS) ×1 IMPLANT
TRAY LAPAROSCOPIC MC (CUSTOM PROCEDURE TRAY) ×2 IMPLANT
TROCAR XCEL BLUNT TIP 100MML (ENDOMECHANICALS) ×1 IMPLANT
TROCAR XCEL NON-BLD 5MMX100MML (ENDOMECHANICALS) ×2 IMPLANT
TUBING INSUFFLATION (TUBING) ×2 IMPLANT
YANKAUER SUCT BULB TIP NO VENT (SUCTIONS) ×1 IMPLANT

## 2016-07-12 NOTE — Consult Note (Signed)
Name: Luke Mcbride MRN: [id number] DOB: [birth year]    ADMISSION DATE:  07/12/2016 CONSULTATION DATE:  2/26  REFERRING MD :  Dr. Donne Hazel  CHIEF COMPLAINT:  Small bowel resection  BRIEF PATIENT DESCRIPTION: 81 year old male with PMH BOOP (diagnosed by biopsy 2009) required small bowel resection due to mass 2/26. PCCM to see to assess pulmonary status post op.   SIGNIFICANT EVENTS  2/26 > to OR for diagnostic laparoscopy with small bowel resection  STUDIES:  1/30 CT abd > Small bowel lesion is identified within the left upper quadrant the abdomen. Finding is worrisome for non obstructing small bowel neoplasm. No evidence for metastatic adenopathy or distant metastatic disease. Aortic atherosclerosis and infrarenal abdominal aortic aneurysm. Recommend followup by ultrasound in 3 years   HISTORY OF PRESENT ILLNESS:  81 year old male with PMH as below, which is significant for vasculitis associated organizing pneumonia (biopsy proven 2009). He is managed on imuran and followed by Dr. Arty Baumgartner. He was followed by Dr. Lake Bells in the pulmonary clinic, but was discharged 03/2016 due to how well he was doing on the aforementioned therapy. Under the direction of his Gastroenterologist he underwent CT scan for abdominal pain, which demonstrated an indeterminate lesion measuring 3.3 x 3.2 x 2.4. He was evaluated for surgery, and underwent diagnostic laparoscopy with small bowel resection 2/26. The procedure was without complication and he was easily extubated post-op. Pulmonary has been asked to see for further evaluation.  PAST MEDICAL HISTORY :   has a past medical history of ALLERGIC RHINITIS; Anemia; Barrett's esophagus; BOOP (bronchiolitis obliterans with organizing pneumonia) ([place]); CAD (coronary artery disease); Cataract; CKD (chronic kidney disease), stage III; GERD (gastroesophageal reflux disease); Glaucoma; Hematuria; Hyperglycemia; Hyperlipemia; Hypertension; Hypothyroidism; ILD  (interstitial lung disease) ([place]); Iritis; Local infection of skin and subcutaneous tissue; Membranoproliferative nephritis; Myocardial infarction (2002); Neutropenia, drug-induced ([place]); Recurrent boils; Residual foreign body in soft tissue; and Vasculitis ([place]).  has a past surgical history that includes Coronary artery bypass graft (2002); Cataract extraction (Bilateral); Toe amputation (2009); left heart catheterization with coronary/graft angiogram (N/A, 07/26/2012); percutaneous coronary stent intervention (pci-s) (07/26/2012); Renal biopsy; and Lung biopsy. Prior to Admission medications   Medication Sig Start Date End Date Taking? Authorizing Provider  acetaminophen (TYLENOL) 500 MG tablet Take 500-1,000 mg by mouth daily as needed for moderate pain or headache.   Yes Historical Provider, MD  aspirin EC 81 MG EC tablet Take 1 tablet (81 mg total) by mouth daily. 07/29/12  Yes Rogelia Mire, NP  azaTHIOprine (IMURAN) 50 MG tablet TAKE 2 TABLETS(100MG ) BY MOUTH IN THE AM AND 1 TABLET(50MG ) BY MOUTH IN THE PM   Yes Historical Provider, MD  clopidogrel (PLAVIX) 75 MG tablet Take 1 tablet (75 mg total) by mouth daily. 02/09/16  Yes [name], MD  finasteride (PROSCAR) 5 MG tablet Take 5 mg by mouth at bedtime.    Yes Historical Provider, MD  levothyroxine (SYNTHROID, LEVOTHROID) 100 MCG tablet Take 100 mcg by mouth daily.     Yes Historical Provider, MD  metoprolol tartrate (LOPRESSOR) 25 MG tablet Take 0.5 tablets (12.5 mg total) by mouth 2 (two) times daily. 02/09/16  Yes [name], MD  Multiple Vitamin (MULTIVITAMIN) tablet Take 1 tablet by mouth daily.     Yes Historical Provider, MD  NITROSTAT 0.4 MG SL tablet PLACE 1 TABLET (0.4 MG TOTAL) UNDER THE TONGUE EVERY 5 (FIVE) MINUTES AS NEEDED. MAY REPEAT X3 Patient taking differently: Place 0.4 mg under the  tongue every 5 (five) minutes as needed for chest pain. May repeat x3 02/10/16  Yes [name], MD  pantoprazole (PROTONIX) 40 MG  tablet TAKE 1 TABLET (40 MG TOTAL) BY MOUTH DAILY. 02/09/16  Yes [name], MD  rosuvastatin (CRESTOR) 10 MG tablet Take 10 mg by mouth at bedtime.    Yes Historical Provider, MD  Tamsulosin HCl (FLOMAX) 0.4 MG CAPS Take 0.4 mg by mouth at bedtime.    Yes Historical Provider, MD  telmisartan (MICARDIS) 40 MG tablet Take 20 mg by mouth daily.    Yes Historical Provider, MD  furosemide (LASIX) 20 MG tablet Take 1 tablet (20 mg total) by mouth daily. Patient not taking: Reported on 07/06/2016 02/09/16   [name], MD   Allergies  Allergen Reactions  . No Known Allergies     FAMILY HISTORY:  family history includes Depression in his other; Diabetes in his mother and other; Heart disease in his mother; Prostate cancer in his father and other. SOCIAL HISTORY:  reports that he has never smoked. He has never used smokeless tobacco. He reports that he does not drink alcohol or use drugs.  REVIEW OF SYSTEMS:   Bolds are positive  Constitutional: weight loss, gain, night sweats, Fevers, chills, fatigue .  HEENT: headaches, Sore throat, sneezing, nasal congestion, post nasal drip, Difficulty swallowing, Tooth/dental problems, visual complaints visual changes, ear ache CV:  chest pain, radiates:,Orthopnea, PND, swelling in lower extremities, dizziness, palpitations, syncope.  GI  heartburn, indigestion, abdominal pain, nausea, vomiting, diarrhea, change in bowel habits, loss of appetite, bloody stools.  Resp: cough, productive: , hemoptysis, dyspnea, chest pain, pleuritic.  Skin: rash or itching or icterus GU: dysuria, change in color of urine, urgency or frequency. flank pain, hematuria  MS: joint pain or swelling. decreased range of motion  Psych: change in mood or affect. depression or anxiety.  Neuro: difficulty with speech, weakness, numbness, ataxia    SUBJECTIVE:   VITAL SIGNS: Temp:  [97.8 F (36.6 C)-97.9 F (36.6 C)] 97.9 F (36.6 C) (02/26 1352) Pulse Rate:  [54-81] 55  (02/26 1430) Resp:  [11-26] 22 (02/26 1430) BP: (123-134)/(69-79) 134/77 (02/26 1425) SpO2:  [96 %-100 %] 100 % (02/26 1430)  PHYSICAL EXAMINATION: General:  Male of normal body habitus in NAD Neuro:  Alert, oriented, non-focal HEENT:  [place]/AT, PERRL, no JVD Cardiovascular:  RRR, no MRG Lungs:  Coarse L base otherwise clear Abdomen:  Soft, mild tenderness Musculoskeletal:  No acute deformity Skin:  Grossly intact with exception of surgical sites.    Recent Labs Lab 07/09/16 0930 07/12/16 0956  NA 137 138  K 5.1 4.3  CL 104 108  CO2 21* 21*  BUN 36* 32*  CREATININE 1.96* 2.03*  GLUCOSE 116* 108*    Recent Labs Lab 07/09/16 0930  HGB 11.5*  HCT 35.8*  WBC 4.9  PLT 204   No results found.  ASSESSMENT / PLAN:  Vasculitis related organizing pneumonia: managed well with imuran. Extubated and tolerating well post operatively in PACU.  - Supplemental O2 as needed to maintain sats < 90%, currently on 2L, no O2 at baseline.  - Continue imuran once able to take PO - Mobilize early as able - Pulmonary hygiene with incentive spirometry  Small bowel mass - Per CCS - Follow biopsy results  CKD - Per primary  H/o CABG - Telemetry  Georgann Housekeeper, AGACNP-BC [place] [facility] Pager [phone] or [phone]  07/12/2016 2:54 PM

## 2016-07-12 NOTE — Interval H&P Note (Signed)
History and Physical Interval Note:  07/12/2016 11:58 AM  Luke Mcbride  has presented today for surgery, with the diagnosis of Small bowel mass  The various methods of treatment have been discussed with the patient and family. After consideration of risks, benefits and other options for treatment, the patient has consented to  Procedure(s): LAPAROSCOPY DIAGNOSTIC (N/A) SMALL BOWEL RESECTION (N/A) as a surgical intervention .  The patient's history has been reviewed, patient examined, no change in status, stable for surgery.  I have reviewed the patient's chart and labs.  Questions were answered to the patient's satisfaction.     Tabytha Gradillas

## 2016-07-12 NOTE — Care Management Note (Addendum)
Case Management Note  Patient Details  Name: Luke Mcbride MRN: [id number] Date of Birth: [birth year]  Subjective/Objective:  S/p small bowel resection today, has acute hypoxic resp failure, hx of bronchiolitis obliterans organizing pna. He lives with his wife at home, pta indep.  He has pcp Dr. Sheela Stack, he has medicaiton coverage and transportation at [employer].  He states he has had [facility] before with [facility] and would like to use them again if he needs it, but he states he does not think he will need HH.  NCM will cont to follow for dc needs.                  Action/Plan:   Expected Discharge Date:                  Expected Discharge Plan:     In-House Referral:     Discharge planning Services  CM Consult  Post Acute Care Choice:    Choice offered to:     DME Arranged:    DME Agency:     HH Arranged:    HH Agency:     Status of Service:  In process, will continue to follow  If discussed at Long Length of Stay Meetings, dates discussed:    Additional Comments:  Zenon Mayo, RN 07/12/2016, 7:20 PM

## 2016-07-12 NOTE — Anesthesia Preprocedure Evaluation (Addendum)
Anesthesia Evaluation  Patient identified by MRN, date of birth, ID band Patient awake    Reviewed: Allergy & Precautions, NPO status , Patient's Chart, lab work & pertinent test results, reviewed documented beta blocker date and time   History of Anesthesia Complications Negative for: history of anesthetic complications  Airway Mallampati: II  TM Distance: >3 FB Neck ROM: Full    Dental  (+) Partial Lower, Poor Dentition, Dental Advisory Given, Missing   Pulmonary shortness of breath, COPD (bronchiolitis),    breath sounds clear to auscultation       Cardiovascular hypertension, Pt. on home beta blockers and Pt. on medications (-) angina+ CAD, + Past MI, + Cardiac Stents (2014) and + CABG (2002)   Rhythm:Regular Rate:Normal  06/30/16 Nuclear stress:  EF: 40%, no ST segment deviation noted during stress, No T wave inversion was noted during stress., Defect 1: There is a large defect of severe severity present in the basal inferolateral, basal anterolateral, mid inferolateral and mid anterolateral location.   Neuro/Psych negative neurological ROS     GI/Hepatic Neg liver ROS, GERD  Controlled,  Endo/Other  Hypothyroidism Morbid obesity  Renal/GU ARF and Renal InsufficiencyRenal disease (creat 2.03, K+ 4.3)     Musculoskeletal   Abdominal (+) + obese,   Peds  Hematology plavix   Anesthesia Other Findings Vasculitis: imuran, s/p cytoxan  Reproductive/Obstetrics                            Anesthesia Physical Anesthesia Plan  ASA: III  Anesthesia Plan: General   Post-op Pain Management:    Induction: Intravenous  Airway Management Planned: Oral ETT  Additional Equipment:   Intra-op Plan:   Post-operative Plan: Possible Post-op intubation/ventilation  Informed Consent: I have reviewed the patients History and Physical, chart, labs and discussed the procedure including the risks, benefits  and alternatives for the proposed anesthesia with the patient or authorized representative who has indicated his/her understanding and acceptance.   Dental advisory given  Plan Discussed with: CRNA and Surgeon  Anesthesia Plan Comments: (Plan routine monitors, GETA)        Anesthesia Quick Evaluation

## 2016-07-12 NOTE — Transfer of Care (Signed)
Immediate Anesthesia Transfer of Care Note  Patient: Luke Mcbride  Procedure(s) Performed: Procedure(s): LAPAROSCOPY DIAGNOSTIC (N/A) SMALL BOWEL RESECTION (N/A)  Patient Location: PACU  Anesthesia Type:General  Level of Consciousness: awake and alert   Airway & Oxygen Therapy: Patient Spontanous Breathing and Patient connected to nasal cannula oxygen  Post-op Assessment: Report given to RN and Post -op Vital signs reviewed and stable  Post vital signs: Reviewed and stable  Last Vitals:  Vitals:   07/12/16 0943 07/12/16 1352  BP: 131/79   Pulse: 81   Resp: 20   Temp: 36.6 C (P) 36.6 C    Last Pain:  Vitals:   07/12/16 0943  TempSrc: Oral         Complications: No apparent anesthesia complications

## 2016-07-12 NOTE — H&P (Signed)
59 yom referred by Dr Jolly Mango for abd pain and small bowel mass seen on ct scan. he has history of cabg in remote past and then a stent. he also has vasculitis. he is on imuran. his last cr in march 2017 is over 2. he for past several months has some upper abdominal pain, no n/v, has some constipation, has lost 15-20 lbs in last 2-3 weeks as he cannot eat due to pain. has stopped his asa. he was evaluated by Dr Havery Moros. on a ct scan there is a indeterminate lesion measuring 3.3x3.2x2.4 cm. there is no adenopathy. egd was done that shows hh, barretts and a couple gastric polyps. cologuard was positive. colonoscopy shows some polyps. he is here to discuss options.   Past Surgical History (April Staton, CMA; 06/24/2016 10:02 AM) Cataract Surgery  Bilateral. Colon Polyp Removal - Colonoscopy  Coronary Artery Bypass Graft  Foot Surgery  Right.  Diagnostic Studies History (April Staton, [place]; 06/24/2016 10:02 AM) Colonoscopy  within last year  Allergies (April Staton, CMA; 06/24/2016 10:03 AM) No Known Drug Allergies 06/24/2016  Medication History (April Staton, CMA; 06/24/2016 10:05 AM) AzaTHIOprine (50MG  Tablet, Oral) Active. Clopidogrel Bisulfate (75MG  Tablet, Oral) Active. Finasteride (5MG  Tablet, Oral) Active. Furosemide (20MG  Tablet, Oral) Active. Levothyroxine Sodium (100MCG Tablet, Oral) Active. Metoprolol Tartrate (25MG  Tablet, Oral) Active. Multiple Vitamin (Oral) Active. Nitroglycerin (0.4MG  Tab Sublingual, Sublingual) Active. Pantoprazole Sodium (40MG  Tablet DR, Oral) Active. Rosuvastatin Calcium (10MG  Tablet, Oral) Active. Tamsulosin HCl (0.4MG  Capsule, Oral) Active. Telmisartan (40MG  Tablet, Oral) Active.  Social History (April Staton, [place]; 06/24/2016 10:02 AM) Alcohol use  Remotely quit alcohol use. Caffeine use  Coffee, Tea. No drug use  Tobacco use  Never smoker.  Family History (April Staton, [place]; 06/24/2016 10:02 AM) Breast Cancer   Sister. Diabetes Mellitus  Mother. Prostate Cancer  Father.  Other Problems (April Staton, CMA; 06/24/2016 10:02 AM) Back Pain  Congestive Heart Failure  Gastroesophageal Reflux Disease  Hemorrhoids  High blood pressure  Hypercholesterolemia  Myocardial infarction  Vascular Disease     Review of Systems (April Staton CMA; 06/24/2016 10:02 AM) General Present- Appetite Loss, Fatigue and Weight Loss. Not Present- Chills, Fever, Night Sweats and Weight Gain. Skin Present- Dryness. Not Present- Change in Wart/Mole, Hives, Jaundice, New Lesions, Non-Healing Wounds, Rash and Ulcer. HEENT Present- Seasonal Allergies and Wears glasses/contact lenses. Not Present- Earache, Hearing Loss, Hoarseness, Nose Bleed, Oral Ulcers, Ringing in the Ears, Sinus Pain, Sore Throat, Visual Disturbances and Yellow Eyes. Respiratory Not Present- Bloody sputum, Chronic Cough, Difficulty Breathing, Snoring and Wheezing. Breast Not Present- Breast Mass, Breast Pain, Nipple Discharge and Skin Changes. Cardiovascular Present- Leg Cramps and Shortness of Breath. Not Present- Chest Pain, Difficulty Breathing Lying Down, Palpitations, Rapid Heart Rate and Swelling of Extremities. Gastrointestinal Present- Abdominal Pain, Change in Bowel Habits, Constipation, Excessive gas, Hemorrhoids and Rectal Pain. Not Present- Bloating, Bloody Stool, Chronic diarrhea, Difficulty Swallowing, Gets full quickly at meals, Indigestion, Nausea and Vomiting. Musculoskeletal Present- Back Pain. Not Present- Joint Pain, Joint Stiffness, Muscle Pain, Muscle Weakness and Swelling of Extremities. Neurological Present- Trouble walking. Not Present- Decreased Memory, Fainting, Headaches, Numbness, Seizures, Tingling, Tremor and Weakness. Psychiatric Not Present- Anxiety, Bipolar, Change in Sleep Pattern, Depression, Fearful and Frequent crying. Hematology Present- Blood Thinners and Easy Bruising. Not Present- Excessive bleeding, Gland  problems, HIV and Persistent Infections.  Vitals (April Staton CMA; 06/24/2016 10:06 AM) 06/24/2016 10:05 AM Weight: 229.13 lb Height: 74in Body Surface Area: 2.3 m Body Mass Index: 29.42 kg/m  Temp.:  97.13F(Oral)  Pulse: 111 (Regular)  BP: 102/78 (Sitting, Left Arm, Standard)       Physical Exam Rolm Bookbinder MD; 06/24/2016 11:28 AM) General Mental Status-Alert. Orientation-Oriented X3.  Chest and Lung Exam Chest and lung exam reveals -on auscultation, normal breath sounds, no adventitious sounds and normal vocal resonance.  Cardiovascular Cardiovascular examination reveals -normal heart sounds, regular rate and rhythm with no murmurs.  Abdomen Note: diastasis recti, soft mild tender upper abdomen, nondistended, bs present   Lymphatic Head & Neck  General Head & Neck Lymphatics: Bilateral - Description - Normal.    Assessment & Plan Rolm Bookbinder MD; 06/24/2016 3:19 PM) SMALL BOWEL MASS (K63.89) Story: I am concerned this is small bowel cancer given symptoms and findings on ct scan. I think he needs dx lsc and likely small bowel resection. I will obtain recs on imuran from nephrology and cardiac issues as well from Dr [name]. will schedule for surgery asap. discussed length of stay, recovery, risks of surgery with patient, will proceed asap

## 2016-07-12 NOTE — Anesthesia Procedure Notes (Signed)
Procedure Name: Intubation Date/Time: 07/12/2016 12:33 PM Performed by: Eligha Bridegroom Pre-anesthesia Checklist: Patient identified, Emergency Drugs available, Suction available, Patient being monitored and Timeout performed Patient Re-evaluated:Patient Re-evaluated prior to inductionOxygen Delivery Method: Circle system utilized Preoxygenation: Pre-oxygenation with 100% oxygen Intubation Type: IV induction Ventilation: Mask ventilation without difficulty Grade View: Grade I Tube type: Oral Tube size: 7.5 mm Number of attempts: 1 Airway Equipment and Method: Stylet Secured at: 21 cm Tube secured with: Tape Dental Injury: Teeth and Oropharynx as per pre-operative assessment and Injury to lip

## 2016-07-12 NOTE — Anesthesia Preprocedure Evaluation (Signed)
Anesthesia Evaluation  Patient identified by MRN, date of birth, ID band Patient awake    Reviewed: Allergy & Precautions, NPO status , Patient's Chart, lab work & pertinent test results, reviewed documented beta blocker date and time   Airway Mallampati: II  TM Distance: >3 FB Neck ROM: Full    Dental  (+) Partial Lower, Poor Dentition, Missing   Pulmonary pneumonia, resolved, COPD,  Interstitial Lung disease   Pulmonary exam normal breath sounds clear to auscultation + decreased breath sounds      Cardiovascular hypertension, Pt. on medications and Pt. on home beta blockers + CAD, + Past MI and + Cardiac Stents  Normal cardiovascular exam Rhythm:Regular Rate:Normal   05/2000: NSTEMI/CABG x 6: LIMA->LAD, VG->D1, VG->OM1->2, VG->PDA->RPL;  b. 07/2007 MV: high lat infarct, no ischemia, EF 47%;  c. Cath/PCI: LM nl, LAD20p, 43m, D1 nl, D2 60-70ost, D3 nl, LCX 70ost, 137m, OM1/OM2 min irregs, RCA 30 diff, PDA 99, LIMA->LAD atretic, VG->D1 100, VG->OM1->2 100, VG->PDA->RPL 90p (4.0x23 Vision BMS);  c. 07/2012 Echo: EF 55%, gr1 DD   Neuro/Psych Glaucoma  Blind OS negative neurological ROS  negative psych ROS   GI/Hepatic GERD  Medicated and Controlled,Small bowel mass Hx/o Barrett's esophagus   Endo/Other  diabetes, Well Controlled, Type 2, Oral Hypoglycemic AgentsHypothyroidism Obesity  Renal/GU Renal InsufficiencyRenal diseaseMembranoproliferative GN  negative genitourinary   Musculoskeletal   Abdominal (+) + obese,   Peds  Hematology  (+) anemia ,   Anesthesia Other Findings   Reproductive/Obstetrics                               Chemistry      Component Value Date/Time   NA 138 07/12/2016 0956   K 4.3 07/12/2016 0956   CL 108 07/12/2016 0956   CO2 21 (L) 07/12/2016 0956   BUN 32 (H) 07/12/2016 0956   CREATININE 2.03 (H) 07/12/2016 0956      Component Value Date/Time   CALCIUM 9.2  07/12/2016 0956   ALKPHOS 59 07/17/2014 1200   AST 24 07/17/2014 1200   ALT 24 07/17/2014 1200   BILITOT 0.7 07/17/2014 1200     Lab Results  Component Value Date   WBC 4.9 07/09/2016   HGB 11.5 (L) 07/09/2016   HCT 35.8 (L) 07/09/2016   MCV 102.6 (H) 07/09/2016   PLT 204 07/09/2016   EKG: normal sinus rhythm, inferior infarct. Echo: 07/27/12 Normal LV size with mild LV hypertrophy. EF 55%. Mildly dilated RV with normal systolic function. No significant valvular abnormalities. Anesthesia Physical Anesthesia Plan  ASA: III  Anesthesia Plan: General   Post-op Pain Management:    Induction: Intravenous and Cricoid pressure planned  Airway Management Planned: Oral ETT  Additional Equipment:   Intra-op Plan:   Post-operative Plan: Extubation in OR  Informed Consent: I have reviewed the patients History and Physical, chart, labs and discussed the procedure including the risks, benefits and alternatives for the proposed anesthesia with the patient or authorized representative who has indicated his/her understanding and acceptance.   Dental advisory given  Plan Discussed with: Anesthesiologist, CRNA and Surgeon  Anesthesia Plan Comments:         Anesthesia Quick Evaluation

## 2016-07-12 NOTE — Anesthesia Postprocedure Evaluation (Signed)
Anesthesia Post Note  Patient: Avis Epley  Procedure(s) Performed: Procedure(s) (LRB): LAPAROSCOPY DIAGNOSTIC (N/A) SMALL BOWEL RESECTION (N/A)  Patient location during evaluation: PACU Anesthesia Type: General Level of consciousness: awake and alert Pain management: pain level controlled Vital Signs Assessment: post-procedure vital signs reviewed and stable Respiratory status: spontaneous breathing, nonlabored ventilation, respiratory function stable and patient connected to nasal cannula oxygen Cardiovascular status: blood pressure returned to baseline and stable Postop Assessment: no signs of nausea or vomiting Anesthetic complications: no       Last Vitals:  Vitals:   07/12/16 1449 07/12/16 1524  BP:  136/72  Pulse: (!) 58 (!) 58  Resp: 14 15  Temp:      Last Pain:  Vitals:   07/12/16 1449  TempSrc:   PainSc: 10-Worst pain ever                 Tiajuana Amass

## 2016-07-12 NOTE — Op Note (Signed)
Preoperative diagnosis: Small bowel mass Postoperative diagnosis: Same as above Procedure: Diagnostic laparoscopy with small bowel resection Surgeon: Dr. Serita Grammes Asst: Judyann Munson Anesthesia: Gen. Estimated blood loss: Minimal Specimens: Small bowel marked short stitch proximal Complications: None Drains: None Sponge and needle count was correct 2 at completion Disposition to recovery in stable condition  Indications: This is a 81 year old male who was been having abdominal pain since end of last year. He has been evaluated by gastroenterology and referred to me for a CAT scan shows what appears to be a small bowel mass. We discussed proceeding to the operating room after being evaluated by both cardiology and pulmonary to proceed with small bowel resection. He certainly is not at low risk but I don't think we have another option at this point.  Procedure: After informed consent was obtained the patient was taken to the operating room. He was given antibiotics. He had SCDs in place. He was then placed under general anesthesia without complication. A Foley catheter was placed. He was then prepped and draped in the standard sterile surgical fashion. A surgical timeout was then performed.  I made a vertical incision after infiltration with Marcaine below his umbilicus. I then entered the peritoneum bluntly after incising the fascia. I then placed a 0 Vicryl pursestring suture and a Hassan trocar. The abdomen was insufflated to 15 mmHg pressure. I then inserted 2 further 5 mm trocars in the left abdomen under direct vision without complication. I was able to run the bowel and identifying the left lower quadrant a mass. I grasped this. I then made a 6 cm incision around the umbilicus incorporating my Hassan trocar. The remainder of the trohars were all removed. I then brought this up through a wound protector. I then chose a point about 7 cm proximal and 7 cm distal to the mass and divided  the small bowel with the GIA stapler. I removed its associated mesentery using the LigaSure device. I placed a suture at the base of this. I then marked this and passes off the table. I then brought the 2 ends of small bowel next to each other with 3-0 silk suture. I then cut off the portion of the staple line. I then inserted the GIA stapler and created an anastomosis. This was hemostatic and patent. I used a TX stapler to close the common enterotomy. I sewed the mesenteric defect shut with 2-0 silk suture. I oversewed the staple line with 3-0 silk suture. I placed 2 3-0 silk sutures at the apex. This was patent, hemostatic, and viable. There is no other abnormality in the abdomen that I could identify. I then placed this back in the abdomen. I closed his fascia with #1 looped PDS He also had an umbilical hernia and his fascia is not very healthy. This was closed. I then closed the incisions with staples. Dressings were applied. He tolerated this well and was transferred to recovery.

## 2016-07-13 ENCOUNTER — Encounter (HOSPITAL_COMMUNITY): Payer: Self-pay | Admitting: General Surgery

## 2016-07-13 LAB — BASIC METABOLIC PANEL
Anion gap: 7 (ref 5–15)
BUN: 28 mg/dL — AB (ref 6–20)
CHLORIDE: 110 mmol/L (ref 101–111)
CO2: 23 mmol/L (ref 22–32)
Calcium: 8.3 mg/dL — ABNORMAL LOW (ref 8.9–10.3)
Creatinine, Ser: 1.94 mg/dL — ABNORMAL HIGH (ref 0.61–1.24)
GFR calc Af Amer: 36 mL/min — ABNORMAL LOW (ref >60)
GFR calc non Af Amer: 31 mL/min — ABNORMAL LOW (ref >60)
GLUCOSE: 100 mg/dL — AB (ref 65–99)
POTASSIUM: 4.7 mmol/L (ref 3.5–5.1)
Sodium: 140 mmol/L (ref 135–145)

## 2016-07-13 LAB — CBC
HEMATOCRIT: 33.3 % — AB (ref 39.0–52.0)
Hemoglobin: 10.6 g/dL — ABNORMAL LOW (ref 13.0–17.0)
MCH: 32.7 pg (ref 26.0–34.0)
MCHC: 31.8 g/dL (ref 30.0–36.0)
MCV: 102.8 fL — AB (ref 78.0–100.0)
Platelets: 179 10*3/uL (ref 150–400)
RBC: 3.24 MIL/uL — ABNORMAL LOW (ref 4.22–5.81)
RDW: 16.8 % — AB (ref 11.5–15.5)
WBC: 6.5 10*3/uL (ref 4.0–10.5)

## 2016-07-13 MED ORDER — HYDROMORPHONE HCL 2 MG/ML IJ SOLN: 1.0000 mg | INTRAMUSCULAR | Status: DC | PRN

## 2016-07-13 MED ORDER — FUROSEMIDE 20 MG PO TABS
20.0000 mg | ORAL_TABLET | Freq: Every day | ORAL | Status: DC
  Administered 2016-07-13 – 2016-07-16 (×4): 20 mg via ORAL
  Filled 2016-07-13 (×4): qty 1

## 2016-07-13 MED ORDER — AZATHIOPRINE 50 MG PO TABS
100.0000 mg | ORAL_TABLET | ORAL | Status: DC
  Administered 2016-07-13 – 2016-07-16 (×4): 100 mg via ORAL
  Filled 2016-07-13 (×4): qty 2

## 2016-07-13 MED ORDER — AZATHIOPRINE 50 MG PO TABS
50.0000 mg | ORAL_TABLET | Freq: Every evening | ORAL | Status: DC
  Administered 2016-07-13 – 2016-07-15 (×3): 50 mg via ORAL
  Filled 2016-07-13 (×4): qty 1

## 2016-07-13 MED ORDER — HEPARIN SODIUM (PORCINE) 5000 UNIT/ML IJ SOLN
5000.0000 [IU] | Freq: Three times a day (TID) | INTRAMUSCULAR | Status: DC
  Administered 2016-07-13 – 2016-07-16 (×9): 5000 [IU] via SUBCUTANEOUS
  Filled 2016-07-13 (×9): qty 1

## 2016-07-13 MED ORDER — IRBESARTAN 75 MG PO TABS
37.5000 mg | ORAL_TABLET | Freq: Every day | ORAL | Status: DC
  Administered 2016-07-13 – 2016-07-16 (×4): 37.5 mg via ORAL
  Filled 2016-07-13 (×3): qty 1
  Filled 2016-07-13: qty 0.5

## 2016-07-13 NOTE — Progress Notes (Signed)
1 Day Post-Op  Subjective: No n/v pain controlled, no flatus yet, discussed surgery  Objective: Vital signs in last 24 hours: Temp:  [97.8 F (36.6 C)-99.4 F (37.4 C)] 99.4 F (37.4 C) (02/27 0807) Pulse Rate:  [50-81] 77 (02/27 0411) Resp:  [11-26] 20 (02/27 0411) BP: (123-146)/(66-85) 146/79 (02/27 0411) SpO2:  [96 %-100 %] 98 % (02/27 0411) FiO2 (%):  [28 %] 28 % (02/26 1557) Last BM Date: 07/11/16  Intake/Output from previous day: 02/26 0701 - 02/27 0700 In: 1959.8 [I.V.:1959.8] Out: 1710 [Urine:1700; Blood:10] Intake/Output this shift: No intake/output data recorded.  General appearance: no distress Resp: decreased bilateral bases Cardio: regular rate and rhythm GI: soft some bs approp tender dressings dry  Lab Results:   Recent Labs  07/13/16 0240  WBC 6.5  HGB 10.6*  HCT 33.3*  PLT 179   BMET  Recent Labs  07/12/16 0956 07/13/16 0240  NA 138 140  K 4.3 4.7  CL 108 110  CO2 21* 23  GLUCOSE 108* 100*  BUN 32* 28*  CREATININE 2.03* 1.94*  CALCIUM 9.2 8.3*   Anti-infectives: Anti-infectives    Start     Dose/Rate Route Frequency Ordered Stop   07/12/16 0941  ceFAZolin (ANCEF) IVPB 2g/100 mL premix     2 g 200 mL/hr over 30 Minutes Intravenous On call to O.R. 07/12/16 0941 07/12/16 1230      Assessment/Plan: POD 1 dx lsc, sbr  1. Continue po pain meds with iv backup 2. pulm toilet, needs to be oob as much as he can today, ICS aggressively 3. Clear liquids today 4. Restart all home meds including imuran 5. Labs all good today, will recheck bmet in am 6. Dc foley 7. Lovenox, scds   [facility] 07/13/2016

## 2016-07-14 LAB — BASIC METABOLIC PANEL
ANION GAP: 7 (ref 5–15)
BUN: 29 mg/dL — ABNORMAL HIGH (ref 6–20)
CO2: 23 mmol/L (ref 22–32)
Calcium: 8.3 mg/dL — ABNORMAL LOW (ref 8.9–10.3)
Chloride: 105 mmol/L (ref 101–111)
Creatinine, Ser: 2.16 mg/dL — ABNORMAL HIGH (ref 0.61–1.24)
GFR calc Af Amer: 32 mL/min — ABNORMAL LOW (ref >60)
GFR, EST NON AFRICAN AMERICAN: 28 mL/min — AB (ref >60)
GLUCOSE: 112 mg/dL — AB (ref 65–99)
POTASSIUM: 4.3 mmol/L (ref 3.5–5.1)
Sodium: 135 mmol/L (ref 135–145)

## 2016-07-14 MED ORDER — SODIUM CHLORIDE 0.9 % IV BOLUS (SEPSIS)
500.0000 mL | Freq: Once | INTRAVENOUS | Status: AC
  Administered 2016-07-14: 500 mL via INTRAVENOUS

## 2016-07-14 NOTE — Progress Notes (Signed)
2 Days Post-Op  Subjective: No flatus yet, no n/v, tolerating clears, pain controlled, voiding, ambulating some, oob  Objective: Vital signs in last 24 hours: Temp:  [97.5 F (36.4 C)-99.4 F (37.4 C)] 98.7 F (37.1 C) (02/28 0441) Pulse Rate:  [69-77] 69 (02/28 0441) Resp:  [15-21] 19 (02/28 0441) BP: (100-136)/(62-82) 117/62 (02/28 0441) SpO2:  [95 %-97 %] 97 % (02/28 0441) Last BM Date: 07/11/16  Intake/Output from previous day: 02/27 0701 - 02/28 0700 In: 158.6 [I.V.:158.6] Out: 400 [Urine:400] Intake/Output this shift: No intake/output data recorded.  Resp: clear to auscultation bilaterally Cardio: regular rate and rhythm GI: soft bs present approp tender wounds clean  Lab Results:   Recent Labs  07/13/16 0240  WBC 6.5  HGB 10.6*  HCT 33.3*  PLT 179   BMET  Recent Labs  07/13/16 0240 07/14/16 0427  NA 140 135  K 4.7 4.3  CL 110 105  CO2 23 23  GLUCOSE 100* 112*  BUN 28* 29*  CREATININE 1.94* 2.16*  CALCIUM 8.3* 8.3*    Anti-infectives: Anti-infectives    Start     Dose/Rate Route Frequency Ordered Stop   07/12/16 0941  ceFAZolin (ANCEF) IVPB 2g/100 mL premix     2 g 200 mL/hr over 30 Minutes Intravenous On call to O.R. 07/12/16 0941 07/12/16 1230      Assessment/Plan: POD 2 dx lsc, sbr  1. Continue po pain meds with iv backup 2. pulm toilet, needs to be oob as much as he can today again, ICS aggressively 3. Clear liquids today, will not advance until some bowel function 4. Cr elevated today , near baseline, uop not a lot yesterday but not sure all measured, will give some extra volume today, didn't take much po yesterday due to not liking liquids, will recheck in am, monitor uop 5. Await pathology 6. Heparin, scds   Aaliyana Fredericks 07/14/2016

## 2016-07-14 NOTE — Evaluation (Signed)
Physical Therapy Evaluation Patient Details Name: Luke Mcbride MRN: [id number] DOB: 09/01/1935 Today's Date: 07/14/2016   History of Present Illness   S/p small bowel resection today, has acute hypoxic resp failure, hx of bronchiolitis obliterans organizing pna. He lives with his wife at home, pta indep.    Clinical Impression  Pt admitted with above diagnosis. Pt currently with functional limitations due to the deficits listed below (see PT Problem List). Pt was able to ambulate in hall with RW with good safety overall. May want a RW for home.  Will follow acutely.   Pt will benefit from skilled PT to increase their independence and safety with mobility to allow discharge to the venue listed below.      Follow Up Recommendations No PT follow up;Supervision - Intermittent    Equipment Recommendations  Rolling walker with 5" wheels    Recommendations for Other Services       Precautions / Restrictions Precautions Precautions: Fall Restrictions Weight Bearing Restrictions: No      Mobility  Bed Mobility Overal bed mobility: Independent                Transfers Overall transfer level: Independent                  Ambulation/Gait Ambulation/Gait assistance: Min guard;Supervision Ambulation Distance (Feet): 275 Feet Assistive device: Rolling walker (2 wheeled) Gait Pattern/deviations: Step-through pattern;Decreased stride length   Gait velocity interpretation: Below normal speed for age/gender General Gait Details: Pt did well with RW.  Not ready to not use device even though we talked about trying without RW.  Pt safe with RW and did not use one PTA.  He is thinking he may want to get one to use initially upon d/c home.   Stairs            Wheelchair Mobility    Modified Rankin (Stroke Patients Only)       Balance Overall balance assessment: Needs assistance Sitting-balance support: No upper extremity supported;Feet supported Sitting  balance-Leahy Scale: Fair     Standing balance support: During functional activity;No upper extremity supported Standing balance-Leahy Scale: Fair Standing balance comment: can stand statically without UE support                              Pertinent Vitals/Pain Pain Assessment: 0-10 Pain Score: 4  Pain Location: abdomen Pain Descriptors / Indicators: Aching Pain Intervention(s): Limited activity within patient's tolerance;Monitored during session;Premedicated before session;Repositioned  VSS  Home Living Family/patient expects to be discharged to:: Private residence Living Arrangements: Spouse/significant other Available Help at Discharge: Family;Available 24 hours/day Type of Home: House Home Access: Stairs to enter Entrance Stairs-Rails: None Entrance Stairs-Number of Steps: 1 Home Layout: Laundry or work area in basement;Two level Home Equipment: None      Prior Function Level of Independence: Independent               Hand Dominance        Extremity/Trunk Assessment   Upper Extremity Assessment Upper Extremity Assessment: Defer to OT evaluation    Lower Extremity Assessment Lower Extremity Assessment: Overall WFL for tasks assessed    Cervical / Trunk Assessment Cervical / Trunk Assessment: Normal  Communication   Communication: No difficulties  Cognition Arousal/Alertness: Awake/alert Behavior During Therapy: WFL for tasks assessed/performed Overall Cognitive Status: Within Functional Limits for tasks assessed  General Comments      Exercises     Assessment/Plan    PT Assessment Patient needs continued PT services  PT Problem List Decreased strength;Decreased range of motion;Decreased activity tolerance;Decreased balance;Decreased mobility;Decreased knowledge of use of DME;Decreased safety awareness;Decreased knowledge of precautions;Pain       PT Treatment Interventions DME instruction;Gait  training;Stair training;Functional mobility training;Therapeutic activities;Therapeutic exercise;Balance training;Patient/family education    PT Goals (Current goals can be found in the Care Plan section)  Acute Rehab PT Goals Patient Stated Goal: to go home PT Goal Formulation: With patient Time For Goal Achievement: 07/21/16 Potential to Achieve Goals: Good    Frequency Min 3X/week   Barriers to discharge        Co-evaluation               End of Session Equipment Utilized During Treatment: Gait belt Activity Tolerance: Patient limited by fatigue Patient left: with call bell/phone within reach;in bed Nurse Communication: Mobility status PT Visit Diagnosis: Unsteadiness on feet (R26.81);Pain Pain - Right/Left:  (bil) Pain - part of body:  (abdomen)         Time: 1050-1100 PT Time Calculation (min) (ACUTE ONLY): 10 min   Charges:   PT Evaluation $PT Eval Low Complexity: 1 Procedure     PT G Codes:         Nesta Scaturro F Ripken Rekowski 07/15/16, 1:55 PM [facility] Acute Rehabilitation [phone] (pager)

## 2016-07-15 ENCOUNTER — Inpatient Hospital Stay (HOSPITAL_COMMUNITY): Payer: Medicare Other

## 2016-07-15 LAB — BASIC METABOLIC PANEL
ANION GAP: 6 (ref 5–15)
BUN: 25 mg/dL — AB (ref 6–20)
CALCIUM: 8.1 mg/dL — AB (ref 8.9–10.3)
CO2: 23 mmol/L (ref 22–32)
Chloride: 108 mmol/L (ref 101–111)
Creatinine, Ser: 1.92 mg/dL — ABNORMAL HIGH (ref 0.61–1.24)
GFR calc Af Amer: 37 mL/min — ABNORMAL LOW (ref >60)
GFR calc non Af Amer: 32 mL/min — ABNORMAL LOW (ref >60)
GLUCOSE: 83 mg/dL (ref 65–99)
Potassium: 3.8 mmol/L (ref 3.5–5.1)
Sodium: 137 mmol/L (ref 135–145)

## 2016-07-15 MED ORDER — POLYETHYLENE GLYCOL 3350 17 G PO PACK
17.0000 g | PACK | Freq: Once | ORAL | Status: AC
  Administered 2016-07-15: 17 g via ORAL
  Filled 2016-07-15: qty 1

## 2016-07-15 MED ORDER — BISACODYL 10 MG RE SUPP
10.0000 mg | Freq: Once | RECTAL | Status: AC
  Administered 2016-07-15: 10 mg via RECTAL
  Filled 2016-07-15: qty 1

## 2016-07-15 NOTE — Progress Notes (Signed)
3 Days Post-Op  Subjective: Ambulating, tol clears, no flatus, no n/v, pain controlled, voiding  Objective: Vital signs in last 24 hours: Temp:  [98.1 F (36.7 C)-98.9 F (37.2 C)] 98.1 F (36.7 C) (03/01 0528) Pulse Rate:  [72-77] 72 (03/01 0528) Resp:  [18] 18 (03/01 0528) BP: (111-133)/(57-76) 133/76 (03/01 0528) SpO2:  [95 %-96 %] 95 % (03/01 0528) Last BM Date: 07/11/16  Intake/Output from previous day: 02/28 0701 - 03/01 0700 In: 2128 [P.O.:990; I.V.:1138] Out: 825 [Urine:825] Intake/Output this shift: No intake/output data recorded.  cv rrr pulm clear bilaterally abd soft approp tender not really distended (always protuberant) incisions clean some bs   Lab Results:   Recent Labs  07/13/16 0240  WBC 6.5  HGB 10.6*  HCT 33.3*  PLT 179   BMET  Recent Labs  07/14/16 0427 07/15/16 0458  NA 135 137  K 4.3 3.8  CL 105 108  CO2 23 23  GLUCOSE 112* 83  BUN 29* 25*  CREATININE 2.16* 1.92*  CALCIUM 8.3* 8.1*   PT/INR No results for input(s): LABPROT, INR in the last 72 hours. ABG No results for input(s): PHART, HCO3 in the last 72 hours.  Invalid input(s): PCO2, PO2  Studies/Results: No results found.  Anti-infectives: Anti-infectives    Start     Dose/Rate Route Frequency Ordered Stop   07/12/16 0941  ceFAZolin (ANCEF) IVPB 2g/100 mL premix     2 g 200 mL/hr over 30 Minutes Intravenous On call to O.R. 07/12/16 0941 07/12/16 1230      Assessment/Plan: POD 3 dx lsc, sbr  1. Continue po pain meds with iv backup 2. pulm toilet, needs to be oob as much as he can today again, ICS aggressively 3. Clear liquids today, will not advance until some bowel function, will check abd film to see if distended difficult to tell on exam 4. Cr better today, will follow 5. Await pathology 6. Heparin, scds  Luke Mcbride 07/15/2016

## 2016-07-16 MED ORDER — OXYCODONE HCL 5 MG PO TABS: 5.0000 mg | ORAL_TABLET | ORAL | 0 refills | Status: DC | PRN

## 2016-07-16 NOTE — Consult Note (Signed)
[facility] CM Primary Care Navigator  07/16/2016  AVERIE Mcbride 1935-08-05 [id number]   Met with patient at the bedside to identify possible discharge needs. Patient reports having increased abdominal pain every time he eats that had led to this admission/surgery.  Patient endorses Dr. Donato Mcbride with [facility] as the primary care provider (seeing him for about 10 years).    Patient shared using [facility] service and [facility] in [place]  to obtain medications without any problem.   He states managing his own medications at home using "pill box" system weekly.   Patient mentioned that he was driving prior to admission but wife Luke Mcbride) will provide transportation to his doctors' appointments after discharge.  His wife will be the primary caregiver at home per patient.   Discharge plan is home with wife's assistance as stated.  Patient voiced understanding to call primary care provider's office when he returns home, for a post discharge follow-up appointment within a week or sooner if needed. Patient letter (with PCP's contact number) was provided as a reminder.  Patient denies any health management needs or concerns at this time. [facility] care management contact information provided for future needs that may arise.  For additional questions please contact:  Luke Mcbride, BSN, RN-BC [facility] PRIMARY CARE Navigator Cell: [phone]

## 2016-07-16 NOTE — Discharge Summary (Signed)
Physician Discharge Summary  Patient ID: Luke Mcbride MRN: [id number] DOB/AGE: 05-29-1935 81 y.o.  Admit date: 07/12/2016 Discharge date: 07/16/2016  Admission Diagnoses: Small bowel mass CAD GERD BOOP HTN ILD CRI  Discharge Diagnoses:  Active Problems:   S/P small bowel resection   Discharged Condition: fair  Hospital Course: 89 yom with multiple medical problems underwent small bowel resection with pathology showing lymphoma. He has resolved ileus and done well postop. Dc home today  Consults: pulmonary  Significant Diagnostic Studies: none  Treatments: surgery: dx lsc, small bowel resection   Disposition: 01-Home or [place], MD Follow up in 2 week(s).   Specialty:  General Surgery Contact information: [address] [place] [address] [phone]           Signed: Rolm Bookbinder 07/16/2016, 8:30 AM

## 2016-07-16 NOTE — Progress Notes (Signed)
Physical Therapy Discharge Patient Details Name: ALEE KATEN MRN: [id number] DOB: 1935/12/03 Today's Date: 07/16/2016 Time: 8185-9093 PT Time Calculation (min) (ACUTE ONLY): 10 min  Patient discharged from PT services secondary to goals met and no further PT needs identified.  Please see latest therapy progress note for current level of functioning and progress toward goals.    Progress and discharge plan discussed with patient and/or caregiver: Patient/Caregiver agrees with plan  GP     Denice Paradise 07/16/2016, 12:01 PM  [facility] Acute Rehabilitation [phone] (pager)

## 2016-07-16 NOTE — Progress Notes (Signed)
Pt tolerated soft diet. Had a bowel movement today. Surgical incisions dry and intact.  Discharge instructions given to the pt. Verbalized understanding. Discharged to home accompanied by wife.

## 2016-07-16 NOTE — Discharge Instructions (Signed)
[facility]      [facility] [phone]  OPEN ABDOMINAL SURGERY: POST OP INSTRUCTIONS  Always review your discharge instruction sheet given to you by the facility where your surgery was performed.  IF YOU HAVE DISABILITY OR FAMILY LEAVE FORMS, YOU MUST BRING THEM TO THE OFFICE FOR PROCESSING.  PLEASE DO NOT GIVE THEM TO YOUR DOCTOR.  1. A prescription for pain medication may be given to you upon discharge.  Take your pain medication as prescribed, if needed.  If narcotic pain medicine is not needed, then you may take acetaminophen (Tylenol) or ibuprofen (Advil) as needed. 2. Take your usually prescribed medications unless otherwise directed. 3. If you need a refill on your pain medication, please contact your pharmacy. They will contact our office to request authorization.  Prescriptions will not be filled after 5pm or on week-ends. 4. You should follow a light diet the first few days after arrival home, such as soup and crackers, pudding, etc.unless your doctor has advised otherwise. A high-fiber, low fat diet can be resumed as tolerated.   Be sure to include lots of fluids daily. Most patients will experience some swelling and bruising on the chest and neck area.  Ice packs will help.  Swelling and bruising can take several days to resolve 5. Most patients will experience some swelling and bruising in the area of the incision. Ice pack will help. Swelling and bruising can take several days to resolve..  6. It is common to experience some constipation if taking pain medication after surgery.  Increasing fluid intake and taking a stool softener will usually help or prevent this problem from occurring.  A mild laxative (Milk of Magnesia or Miralax) should be taken according to package directions if there are no bowel movements after 48 hours. 7.  You may have steri-strips (small skin tapes) in place directly over the incision.  These strips should be left on the skin for 7-10 days.  If your  surgeon used skin glue on the incision, you may shower in 24 hours.  The glue will flake off over the next 2-3 weeks.  Any sutures or staples will be removed at the office during your follow-up visit. You may find that a light gauze bandage over your incision may keep your staples from being rubbed or pulled. You may shower and replace the bandage daily. 8. ACTIVITIES:  You may resume regular (light) daily activities beginning the next day--such as daily self-care, walking, climbing stairs--gradually increasing activities as tolerated.  You may have sexual intercourse when it is comfortable.  Refrain from any heavy lifting or straining until approved by your doctor. a. You may drive when you no longer are taking prescription pain medication, you can comfortably wear a seatbelt, and you can safely maneuver your car and apply brakes b. Return to Work: ___________________________________ 9. You should see your doctor in the office for a follow-up appointment approximately two weeks after your surgery.  Make sure that you call for this appointment within a day or two after you arrive home to insure a convenient appointment time. OTHER INSTRUCTIONS:  _____________________________________________________________ _____________________________________________________________  WHEN TO CALL YOUR DOCTOR: 1. Fever over 101.0 2. Inability to urinate 3. Nausea and/or vomiting 4. Extreme swelling or bruising 5. Continued bleeding from incision. 6. Increased pain, redness, or drainage from the incision. 7. Difficulty swallowing or breathing 8. Muscle cramping or spasms. 9. Numbness or tingling in hands or feet or around lips.  The clinic staff is available to   answer your questions during regular business hours.  Please don't hesitate to call and ask to speak to one of the nurses if you have concerns.  For further questions, please visit [email or web address]   

## 2016-07-16 NOTE — Progress Notes (Signed)
4 Days Post-Op  Subjective: Having flatus and bm, tol diet, wants to go home  Objective: Vital signs in last 24 hours: Temp:  [98.2 F (36.8 C)-98.4 F (36.9 C)] 98.4 F (36.9 C) (03/02 0626) Pulse Rate:  [71-80] 71 (03/02 0626) Resp:  [18] 18 (03/02 0626) BP: (132-148)/(64-86) 140/84 (03/02 0626) SpO2:  [97 %-99 %] 97 % (03/02 0626) Last BM Date: 07/15/16  Intake/Output from previous day: 03/01 0701 - 03/02 0700 In: 2820 [P.O.:1470; I.V.:1350] Out: 1000 [Urine:1000] Intake/Output this shift: No intake/output data recorded.  Resp: clear to auscultation bilaterally Cardio: regular rate and rhythm GI: soft nt/nd wound clean bs present  Lab Results:  No results for input(s): WBC, HGB, HCT, PLT in the last 72 hours. BMET  Recent Labs  07/14/16 0427 07/15/16 0458  NA 135 137  K 4.3 3.8  CL 105 108  CO2 23 23  GLUCOSE 112* 83  BUN 29* 25*  CREATININE 2.16* 1.92*  CALCIUM 8.3* 8.1*   PT/INR No results for input(s): LABPROT, INR in the last 72 hours. ABG No results for input(s): PHART, HCO3 in the last 72 hours.  Invalid input(s): PCO2, PO2  Studies/Results: Dg Abd Portable 1v  Result Date: 07/15/2016 CLINICAL DATA:  Ileus following gastrointestinal surgery. EXAM: PORTABLE ABDOMEN - 1 VIEW COMPARISON:  06/15/2016. FINDINGS: Surgical staples over the lower abdomen are identified. Based on this AP portable supine radiograph, prominence of the small and large bowel is consistent with ileus. Cannot definitively assess for air-fluid levels or pneumoperitoneum based on this limited exam. IMPRESSION: Findings consistent with ileus.  See discussion above. Electronically Signed   By: Staci Righter M.D.   On: 07/15/2016 07:55    Anti-infectives: Anti-infectives    Start     Dose/Rate Route Frequency Ordered Stop   07/12/16 0941  ceFAZolin (ANCEF) IVPB 2g/100 mL premix     2 g 200 mL/hr over 30 Minutes Intravenous On call to O.R. 07/12/16 0941 07/12/16 1230       Assessment/Plan: POD 4dx lsc, sbr  1. Continue po pain meds with iv backup 2. 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 Dc home today 4. Return next week staple removal  Clayson Riling 07/16/2016

## 2016-07-16 NOTE — Progress Notes (Signed)
Physical Therapy Treatment Patient Details Name: Luke Mcbride MRN: [id number] DOB: 1935-12-01 Today's Date: 07/16/2016    History of Present Illness  S/p small bowel resection today, has acute hypoxic resp failure, hx of bronchiolitis obliterans organizing pna. He lives with his wife at home, pta indep.      PT Comments    Pt admitted with above diagnosis. Pt currently without functional limitations with score of 22/24 on DGI.  Low fall risk.  Feels he is at baseline and Ambulates with Independence.  Goals met.  D/c PT.    Follow Up Recommendations  No PT follow up     Equipment Recommendations  None recommended by PT    Recommendations for Other Services       Precautions / Restrictions Precautions Precautions: Fall Restrictions Weight Bearing Restrictions: No    Mobility  Bed Mobility Overal bed mobility: Independent                Transfers Overall transfer level: Independent                  Ambulation/Gait Ambulation/Gait assistance: Independent Ambulation Distance (Feet): 350 Feet Assistive device: None Gait Pattern/deviations: Decreased stride length   Gait velocity interpretation: at or above normal speed for age/gender General Gait Details: Pt no longer needs RW. States he feels he is at baseline.   No LOB with min challenges.    Stairs Stairs: Yes   Stair Management: One rail Right;Alternating pattern;Step to pattern;Forwards Number of Stairs: 12 General stair comments: Pt ascended stairs step to pattern.  Came down steps with alternating pattern leading with right LE first due to pain in abdomen per pt.  Good safety with use of rail.    Wheelchair Mobility    Modified Rankin (Stroke Patients Only)       Balance Overall balance assessment: Needs assistance Sitting-balance support: No upper extremity supported;Feet supported Sitting balance-Leahy Scale: Good     Standing balance support: During functional activity;No upper  extremity supported Standing balance-Leahy Scale: Good Standing balance comment: can stand statically without UE support                     Cognition Arousal/Alertness: Awake/alert Behavior During Therapy: WFL for tasks assessed/performed Overall Cognitive Status: Within Functional Limits for tasks assessed                      Exercises      General Comments General comments (skin integrity, edema, etc.): Scored 22/24 on DGI.  Low risk of falls.        Pertinent Vitals/Pain Pain Assessment: 0-10 Pain Score: 3  Pain Location: abdomen Pain Descriptors / Indicators: Aching Pain Intervention(s): Limited activity within patient's tolerance;Monitored during session;Repositioned    Home Living                      Prior Function            PT Goals (current goals can now be found in the care plan section) Progress towards PT goals: Goals met/education completed, patient discharged from PT    Frequency    Min 3X/week      PT Plan Current plan remains appropriate    Co-evaluation             End of Session Equipment Utilized During Treatment: Gait belt Activity Tolerance: Patient tolerated treatment well Patient left: with call bell/phone within reach;in chair Nurse Communication: Mobility status  PT Visit Diagnosis: Pain;Muscle weakness (generalized) (M62.81) Pain - Right/Left:  (bil) Pain - part of body:  (abdomen)     Time: 4627-0350 PT Time Calculation (min) (ACUTE ONLY): 10 min  Charges:  $Gait Training: 8-22 mins                    G Codes:       Luke Mcbride 07-24-2016, 12:00 PM [place] Luke Mcbride,PT Acute Rehabilitation [phone] (pager)

## 2016-07-19 ENCOUNTER — Telehealth: Payer: Self-pay | Admitting: Hematology

## 2016-07-19 NOTE — Telephone Encounter (Signed)
Appt has been scheduled for the pt to see Dr. Irene Limbo on 3/15 at [place] to arrive 30 min early. Pt voiced understanding and has agreed to the appt date and time.

## 2016-07-29 ENCOUNTER — Telehealth: Payer: Self-pay | Admitting: Hematology

## 2016-07-29 ENCOUNTER — Encounter: Payer: Self-pay | Admitting: Hematology

## 2016-07-29 ENCOUNTER — Ambulatory Visit (HOSPITAL_BASED_OUTPATIENT_CLINIC_OR_DEPARTMENT_OTHER): Payer: Medicare Other | Admitting: Hematology

## 2016-07-29 ENCOUNTER — Ambulatory Visit (HOSPITAL_BASED_OUTPATIENT_CLINIC_OR_DEPARTMENT_OTHER): Payer: Medicare Other

## 2016-07-29 VITALS — BP 130/82 | HR 90 | Temp 97.6°F | Resp 19 | Wt 220.1 lb

## 2016-07-29 DIAGNOSIS — R740 Nonspecific elevation of levels of transaminase and lactic acid dehydrogenase [LDH]: Secondary | ICD-10-CM | POA: Diagnosis not present

## 2016-07-29 DIAGNOSIS — C8339 Diffuse large B-cell lymphoma, extranodal and solid organ sites: Secondary | ICD-10-CM

## 2016-07-29 DIAGNOSIS — R7402 Elevation of levels of lactic acid dehydrogenase (LDH): Secondary | ICD-10-CM

## 2016-07-29 DIAGNOSIS — D649 Anemia, unspecified: Secondary | ICD-10-CM

## 2016-07-29 DIAGNOSIS — C83398 Diffuse large b-cell lymphoma of other extranodal and solid organ sites: Secondary | ICD-10-CM

## 2016-07-29 LAB — CBC & DIFF AND RETIC
BASO%: 0.8 % (ref 0.0–2.0)
Basophils Absolute: 0.1 10*3/uL (ref 0.0–0.1)
EOS ABS: 0.1 10*3/uL (ref 0.0–0.5)
EOS%: 2 % (ref 0.0–7.0)
HCT: 36.3 % — ABNORMAL LOW (ref 38.4–49.9)
HGB: 11.6 g/dL — ABNORMAL LOW (ref 13.0–17.1)
IMMATURE RETIC FRACT: 12.5 % — AB (ref 3.00–10.60)
LYMPH#: 0.4 10*3/uL — AB (ref 0.9–3.3)
LYMPH%: 6.5 % — ABNORMAL LOW (ref 14.0–49.0)
MCH: 33 pg (ref 27.2–33.4)
MCHC: 32 g/dL (ref 32.0–36.0)
MCV: 103.1 fL — AB (ref 79.3–98.0)
MONO#: 0.7 10*3/uL (ref 0.1–0.9)
MONO%: 12.1 % (ref 0.0–14.0)
NEUT%: 78.6 % — ABNORMAL HIGH (ref 39.0–75.0)
NEUTROS ABS: 4.8 10*3/uL (ref 1.5–6.5)
NRBC: 0 % (ref 0–0)
Platelets: 265 10*3/uL (ref 140–400)
RBC: 3.52 10*6/uL — AB (ref 4.20–5.82)
RDW: 16.2 % — AB (ref 11.0–14.6)
RETIC %: 1.86 % — AB (ref 0.80–1.80)
Retic Ct Abs: 65.47 10*3/uL (ref 34.80–93.90)
WBC: 6.1 10*3/uL (ref 4.0–10.3)

## 2016-07-29 LAB — COMPREHENSIVE METABOLIC PANEL
ALT: 30 U/L (ref 0–55)
AST: 29 U/L (ref 5–34)
Albumin: 3.7 g/dL (ref 3.5–5.0)
Alkaline Phosphatase: 89 U/L (ref 40–150)
Anion Gap: 12 mEq/L — ABNORMAL HIGH (ref 3–11)
BUN: 43.1 mg/dL — ABNORMAL HIGH (ref 7.0–26.0)
CO2: 22 meq/L (ref 22–29)
Calcium: 11.4 mg/dL — ABNORMAL HIGH (ref 8.4–10.4)
Chloride: 107 mEq/L (ref 98–109)
Creatinine: 2.1 mg/dL — ABNORMAL HIGH (ref 0.7–1.3)
EGFR: 29 mL/min/{1.73_m2} — AB (ref >90)
GLUCOSE: 105 mg/dL (ref 70–140)
POTASSIUM: 4.8 meq/L (ref 3.5–5.1)
SODIUM: 141 meq/L (ref 136–145)
Total Bilirubin: 0.46 mg/dL (ref 0.20–1.20)
Total Protein: 7.7 g/dL (ref 6.4–8.3)

## 2016-07-29 LAB — LACTATE DEHYDROGENASE: LDH: 305 U/L — ABNORMAL HIGH (ref 125–245)

## 2016-07-29 NOTE — Telephone Encounter (Signed)
Gave patient AVS . RTC with K[facility] to be scheduled. Irene Limbo has no avaiblable on days requested. Sent message. Will follow up when appt is scheduled

## 2016-08-02 NOTE — Progress Notes (Signed)
[name]    HEMATOLOGY/ONCOLOGY CONSULTATION NOTE  Date of Service: 08/02/2016  Patient Care Team: Donato Heinz, MD as PCP - General (Nephrology) Elsie Stain, MD as Consulting Physician (Pulmonary Disease)  CHIEF COMPLAINTS/PURPOSE OF CONSULTATION:  Diffuse large B cell lymphoma of the small intestine  HISTORY OF PRESENTING ILLNESS:   Luke Mcbride is a wonderful 81 y.o. male who has been referred to [place] by Dr .Donetta Potts, MD and Mauricio Po MD for evaluation and management of newly diagnosed diffuse large B-cell lymphoma of the small bowel.  Patient has a history of multiple medical comorbidities as noted below including coronary artery disease status post PCI and CABG, chronic kidney disease stage III, Barrett's esophagus, hypertension, dyslipidemia, interstitial lung disease, GERD, vasculitis involving the lungs and kidneys on chronic Imuran therapy.  He was having upper abdominal discomfort for a few months with loss of about 15-20 pounds. He had a CT scan of the abdomen done by Dr. Havery Moros on 06/15/2016 which showed a 3.3 x 3.2 x 2.4 cm indeterminate lesion involving the left upper quadrant small bowel loops. No additional small bowel lesions noted. No ascites. No retroperitoneal mesenteric or pelvic lymphadenopathy noted.  Patient was seen by Dr. Serita Grammes and underwent a diagnostic laparoscopy with small bowel resection on 07/12/2016. Pathology showed that the lesion was consistent with diffuse large B-cell lymphoma 2 cm in length and 4 cm in width centrally ulcerated mass involving 100% of the bowel circumference. The tumor was about 1 cm thick invading through the entire wall and into the underlying mesenteric fat. The tumor also abuts and focally involve the serosa. No extramural satellite lymph nodes noted. Did not seem to involve the peri-intestinal lymph nodes. Tumor was noted to have a germinal center phenotype.  Patient is healing well from surgery.  Continues to be on Imuran 100 mg by mouth daily. Has not reported any other peripheral enlarged lymph nodes. He is starting to eat better.  MEDICAL HISTORY:  Past Medical History:  Diagnosis Date  . ALLERGIC RHINITIS   . Anemia    hx  . Barrett's esophagus   . BOOP (bronchiolitis obliterans with organizing pneumonia) ([place])    a. s/p R VATS 2008.  [name] CAD (coronary artery disease)    a. 05/2000: NSTEMI/CABG x 6: LIMA->LAD, VG->D1, VG->OM1->2, VG->PDA->RPL;  b. 07/2007 MV: high lat infarct, no ischemia, EF 47%;  c. Cath/PCI: LM nl, LAD20p, 10m, D1 nl, D2 60-70ost, D3 nl, LCX 70ost, 135m, OM1/OM2 min irregs, RCA 30 diff, PDA 99, LIMA->LAD atretic, VG->D1 100, VG->OM1->2 100, VG->PDA->RPL 90p (4.0x23 Vision BMS);  c. 07/2012 Echo: EF 55%, gr1 DD.  [name] Cataract   . CKD (chronic kidney disease), stage III    a. renal bx 2008: GLN with vasculitis  . GERD (gastroesophageal reflux disease)   . Glaucoma    a. Cannot see out of L eye.  . Hematuria    Microscopic  . Hyperglycemia    Patient reported while on prednisone, had to take insulin  . Hyperlipemia   . Hypertension   . Hypothyroidism   . ILD (interstitial lung disease) ([place])   . Iritis   . Local infection of skin and subcutaneous tissue   . Membranoproliferative nephritis   . Myocardial infarction 2002  . Neutropenia, drug-induced ([place])   . Recurrent boils   . Residual foreign body in soft tissue   . Vasculitis ([place])    a.  pauciimmune vasculitis with renal involvement and hx of transient hemoptysis  in the past with associated BOOP, 2008 (renal bx 2008: GLN with vasculitis). b. History of treatment with 2 cycles of Cytoxan and pheresis. H/o hemoptysis and pulm hemorrhage with 2nd cycle of cytoxan.    SURGICAL HISTORY: Past Surgical History:  Procedure Laterality Date  . BOWEL RESECTION N/A 07/12/2016   Procedure: SMALL BOWEL RESECTION;  Surgeon: Rolm Bookbinder, MD;  Location: [facility];  Service: General;  Laterality: N/A;  . CATARACT  EXTRACTION Bilateral   . CORONARY ARTERY BYPASS GRAFT  2002  . LAPAROSCOPY N/A 07/12/2016   Procedure: LAPAROSCOPY DIAGNOSTIC;  Surgeon: Rolm Bookbinder, MD;  Location: [facility];  Service: General;  Laterality: N/A;  . LEFT HEART CATHETERIZATION WITH CORONARY/GRAFT ANGIOGRAM N/A 07/26/2012   Procedure: LEFT HEART CATHETERIZATION WITH Beatrix Fetters;  Surgeon: Wellington Hampshire, MD;  Location: [facility] CATH LAB;  Service: Cardiovascular;  Laterality: N/A;  . LUNG BIOPSY    . PERCUTANEOUS CORONARY STENT INTERVENTION (PCI-S)  07/26/2012   Procedure: PERCUTANEOUS CORONARY STENT INTERVENTION (PCI-S);  Surgeon: Wellington Hampshire, MD;  Location: [facility] CATH LAB;  Service: Cardiovascular;;  . RENAL BIOPSY    . TOE AMPUTATION  2009   hammer toe    SOCIAL HISTORY: Social History   Social History  . Marital status: Married    Spouse name: N/A  . Number of children: N/A  . Years of education: N/A   Occupational History  . [place] History Main Topics  . Smoking status: Never Smoker  . Smokeless tobacco: Never Used  . Alcohol use No  . Drug use: No  . Sexual activity: Not on file   Other Topics Concern  . Not on file   Social History Narrative   Regular exercise - yes      [facility] (07/12/16):   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  no pets currently. No bird or mold exposure.    FAMILY HISTORY: Family History  Problem Relation Age of Onset  . Heart disease Mother   . Diabetes Mother   . Prostate cancer Father   . Depression Other   . Diabetes Other   . Prostate cancer Other   . Colon polyps Neg Hx   . Colon cancer Neg Hx   . Rectal cancer Neg Hx   . Stomach cancer Neg Hx   . Esophageal cancer Neg Hx     ALLERGIES:  is allergic to no known allergies.  MEDICATIONS:  Current Outpatient Prescriptions  Medication Sig Dispense Refill  . acetaminophen (TYLENOL) 500 MG tablet Take 500-1,000 mg by mouth daily as needed for moderate pain  or headache.    [name] aspirin EC 81 MG EC tablet Take 1 tablet (81 mg total) by mouth daily. 30 tablet   . azaTHIOprine (IMURAN) 50 MG tablet TAKE 2 TABLETS(100MG ) BY MOUTH IN THE AM AND 1 TABLET(50MG ) BY MOUTH IN THE PM    . clopidogrel (PLAVIX) 75 MG tablet Take 1 tablet (75 mg total) by mouth daily. 90 tablet 2  . finasteride (PROSCAR) 5 MG tablet Take 5 mg by mouth at bedtime.     [name] levothyroxine (SYNTHROID, LEVOTHROID) 100 MCG tablet Take 100 mcg by mouth daily.      . metoprolol tartrate (LOPRESSOR) 25 MG tablet Take 0.5 tablets (12.5 mg total) by mouth 2 (two) times daily. 90 tablet 2  . Multiple Vitamin (MULTIVITAMIN) tablet Take 1 tablet by mouth daily.      [name] NITROSTAT 0.4 MG SL  tablet PLACE 1 TABLET (0.4 MG TOTAL) UNDER THE TONGUE EVERY 5 (FIVE) MINUTES AS NEEDED. MAY REPEAT X3 (Patient taking differently: Place 0.4 mg under the tongue every 5 (five) minutes as needed for chest pain. May repeat x3) 25 tablet 2  . pantoprazole (PROTONIX) 40 MG tablet TAKE 1 TABLET (40 MG TOTAL) BY MOUTH DAILY. 90 tablet 2  . rosuvastatin (CRESTOR) 10 MG tablet Take 10 mg by mouth at bedtime.     . Tamsulosin HCl (FLOMAX) 0.4 MG CAPS Take 0.4 mg by mouth at bedtime.     [name] telmisartan (MICARDIS) 40 MG tablet Take 20 mg by mouth daily.      No current facility-administered medications for this visit.     REVIEW OF SYSTEMS:    10 Point review of Systems was done is negative except as noted above.  PHYSICAL EXAMINATION: ECOG PERFORMANCE STATUS: 2 - Symptomatic, <50% confined to bed  . Vitals:   07/29/16 1154  BP: 130/82  Pulse: 90  Resp: 19  Temp: 97.6 F (36.4 C)   Filed Weights   07/29/16 1154  Weight: 220 lb 1.6 oz (99.8 kg)   .Body mass index is 29.85 kg/m.  GENERAL:alert, in no acute distress and comfortable SKIN: no acute rashes, no significant lesions EYES: conjunctiva are pink and non-injected, sclera anicteric OROPHARYNX: MMM, no exudates, no oropharyngeal erythema or  ulceration NECK: supple, no JVD LYMPH:  no palpable lymphadenopathy in the cervical, axillary or inguinal regions LUNGS: clear to auscultation b/l with normal respiratory effort HEART: regular rate & rhythm ABDOMEN:  normoactive bowel sounds , non tender, not distended. Healing laparoscopic surgical scars Extremity: no pedal edema PSYCH: alert & oriented x 3 with fluent speech NEURO: no focal motor/sensory deficits  LABORATORY DATA:  I have reviewed the data as listed  . CBC Latest Ref Rng & Units 07/29/2016 07/13/2016 07/09/2016  WBC 4.0 - 10.3 10e3/uL 6.1 6.5 4.9  Hemoglobin 13.0 - 17.1 g/dL 11.6(L) 10.6(L) 11.5(L)  Hematocrit 38.4 - 49.9 % 36.3(L) 33.3(L) 35.8(L)  Platelets 140 - 400 10e3/uL 265 179 204    . CMP Latest Ref Rng & Units 07/29/2016 07/15/2016 07/14/2016  Glucose 70 - 140 mg/dl 105 83 112(H)  BUN 7.0 - 26.0 mg/dL 43.1(H) 25(H) 29(H)  Creatinine 0.7 - 1.3 mg/dL 2.1(H) 1.92(H) 2.16(H)  Sodium 136 - 145 mEq/L 141 137 135  Potassium 3.5 - 5.1 mEq/L 4.8 3.8 4.3  Chloride 101 - 111 mmol/L - 108 105  CO2 22 - 29 mEq/L 22 23 23   Calcium 8.4 - 10.4 mg/dL 11.4(H) 8.1(L) 8.3(L)  Total Protein 6.4 - 8.3 g/dL 7.7 - -  Total Bilirubin 0.20 - 1.20 mg/dL 0.46 - -  Alkaline Phos 40 - 150 U/L 89 - -  AST 5 - 34 U/L 29 - -  ALT 0 - 55 U/L 30 - -   . Lab Results  Component Value Date   LDH 305 (H) 07/29/2016     RADIOGRAPHIC STUDIES: I have personally reviewed the radiological images as listed and agreed with the findings in the report. Dg Abd Portable 1v  Result Date: 07/15/2016 CLINICAL DATA:  Ileus following gastrointestinal surgery. EXAM: PORTABLE ABDOMEN - 1 VIEW COMPARISON:  06/15/2016. FINDINGS: Surgical staples over the lower abdomen are identified. Based on this AP portable supine radiograph, prominence of the small and large bowel is consistent with ileus. Cannot definitively assess for air-fluid levels or pneumoperitoneum based on this limited exam. IMPRESSION: Findings  consistent with ileus.  See  discussion above. Electronically Signed   By: Staci Righter M.D.   On: 07/15/2016 07:55   CT abdomen/pelvis (06/15/2016) IMPRESSION: 1. Small bowel lesion is identified within the left upper quadrant the abdomen. Finding is worrisome for non obstructing small bowel neoplasm. 2. No evidence for metastatic adenopathy or distant metastatic disease. 3. Aortic atherosclerosis and infrarenal abdominal aortic aneurysm. Recommend followup by ultrasound in 3 years. This recommendation follows ACR consensus guidelines: White Paper of the ACR Incidental Findings Committee II on Vascular Findings. Natasha Mead Coll Radiol 2013; 10:789-794   Electronically Signed   By: Kerby Moors M.D.   On: 06/15/2016 15:14  ASSESSMENT & PLAN:   81 yo male with multiple medical co-morbidities including cardiac co-morbidities, IPF, vascular on chronic immunosupression with   1) Newly diagnosed Diffuse large B-cell lymphoma involving the small intestine (germinal cell phenotype) with complete thickness involvement of bowel and abutting serosa. Patient on labs today has elevated LDH of 305 and hypercalcemia concerning for possible presence of more extensive DLBCL 2) chronic kidney disease stage III baseline creatinine about 2 3) coronary disease status post PCI and CABG - had a myocardial perfusion imaging scan on 06/30/2016 that showed a moderately decreased ejection fraction of 30-44%- this would likely include anthracycline use. 4) history of pulmonary and renal vasculitis on chronic Imuran Plan -Spent significant time discussing the diagnosis, prognosis, staging, clinical features and treatment options for diffuse large B-cell lymphoma. -Labs were done today which shows mild anemia with hemoglobin of 11.6. Elevated LDH and calcium were concerning for persistent/more extensive lymphoma. -We'll need to get an urgent PET/CT scan to complete the staging for his newly diagnosed diffuse large  B-cell lymphoma -Patient counseled to maintain good hydration -Hold any calcium replacement at this time. -His chronic immune suppression is certainly a possible contribution factor due to development of his lymphoma. He will need to follow-up with Dr. Meredeth Ide to determine if tapering down of his immunosuppression is possible.  -if ischemic cardiomyopathy, CKD and other medical co-morbidities certain would make his treatment challenging for his NHL.  Labs today PET/CT in a week RTC with Dr Irene Limbo in 2weeks 3/28 or 3/29  All of the patients questions were answered with apparent satisfaction. The patient knows to call the clinic with any problems, questions or concerns.  I spent 50 minutes counseling the patient face to face. The total time spent in the appointment was 60 minutes and more than 50% was on counseling and direct patient cares.    Sullivan Lone MD [place] AAHIVMS [facility] Hematology/Oncology Physician [facility]  (Office):       [phone] (Work cell):  [phone] (Fax):           [phone]  08/02/2016 11:05 PM

## 2016-08-05 ENCOUNTER — Telehealth: Payer: Self-pay | Admitting: Emergency Medicine

## 2016-08-05 ENCOUNTER — Telehealth: Payer: Self-pay | Admitting: Hematology

## 2016-08-05 NOTE — Telephone Encounter (Signed)
RN called patient back and inform him that his PET scan was authorized and he can schedule it with Central Scheduling. Patient verbalized understanding.

## 2016-08-05 NOTE — Telephone Encounter (Signed)
Unable to reach patient. Left appt time and date on the message.

## 2016-08-05 NOTE — Telephone Encounter (Signed)
Patient called stating "I missed a call from your office". Advised patient of appointment made today for return office visit on 3/28 with Dr Irene Limbo; per patient;  scans that Dr Irene Limbo ordered have NOT been scheduled yet and will need to be schedule prior to next office visit.

## 2016-08-06 ENCOUNTER — Ambulatory Visit (HOSPITAL_COMMUNITY)
Admission: RE | Admit: 2016-08-06 | Discharge: 2016-08-06 | Disposition: A | Discharge status: Home / Self Care | Payer: Medicare Other | Source: Ambulatory Visit | Attending: Hematology | Admitting: Hematology

## 2016-08-06 DIAGNOSIS — C8339 Diffuse large B-cell lymphoma, extranodal and solid organ sites: Secondary | ICD-10-CM | POA: Insufficient documentation

## 2016-08-06 DIAGNOSIS — C8330 Diffuse large B-cell lymphoma, unspecified site: Secondary | ICD-10-CM | POA: Diagnosis not present

## 2016-08-06 LAB — GLUCOSE, CAPILLARY: Glucose-Capillary: 120 mg/dL — ABNORMAL HIGH (ref 65–99)

## 2016-08-06 MED ORDER — FLUDEOXYGLUCOSE F - 18 (FDG) INJECTION
10.8000 | Freq: Once | INTRAVENOUS | Status: AC | PRN
  Administered 2016-08-06: 10.8 via INTRAVENOUS

## 2016-08-07 ENCOUNTER — Other Ambulatory Visit: Payer: Self-pay | Admitting: Hematology

## 2016-08-07 ENCOUNTER — Encounter: Payer: Self-pay | Admitting: Hematology

## 2016-08-07 DIAGNOSIS — C8338 Diffuse large B-cell lymphoma, lymph nodes of multiple sites: Secondary | ICD-10-CM

## 2016-08-07 DIAGNOSIS — C8330 Diffuse large B-cell lymphoma, unspecified site: Secondary | ICD-10-CM

## 2016-08-07 HISTORY — DX: Diffuse large B-cell lymphoma, unspecified site: C83.30

## 2016-08-07 NOTE — Progress Notes (Signed)
START ON PATHWAY REGIMEN - Lymphoma and CLL     A cycle is every 21 days:     Rituximab      Cyclophosphamide      Etoposide      Vincristine      Prednisone   **Always confirm dose/schedule in your pharmacy ordering system**    Patient Characteristics: Diffuse Large B Cell, First Line, Stage III and IV Disease Type: Not Applicable Disease Type: Diffuse Large B Cell Line of therapy: First Line [place] Stage: IV  Intent of Therapy: Curative Intent, Discussed with Patient

## 2016-08-11 ENCOUNTER — Telehealth: Payer: Self-pay | Admitting: Hematology

## 2016-08-11 ENCOUNTER — Encounter: Payer: Self-pay | Admitting: Hematology

## 2016-08-11 ENCOUNTER — Ambulatory Visit (HOSPITAL_BASED_OUTPATIENT_CLINIC_OR_DEPARTMENT_OTHER): Payer: Medicare Other

## 2016-08-11 ENCOUNTER — Ambulatory Visit (HOSPITAL_BASED_OUTPATIENT_CLINIC_OR_DEPARTMENT_OTHER): Payer: Medicare Other | Admitting: Hematology

## 2016-08-11 VITALS — BP 113/61 | HR 89 | Temp 98.4°F | Resp 18 | Ht 72.0 in | Wt 220.5 lb

## 2016-08-11 DIAGNOSIS — C8339 Diffuse large B-cell lymphoma, extranodal and solid organ sites: Secondary | ICD-10-CM

## 2016-08-11 DIAGNOSIS — R7402 Elevation of levels of lactic acid dehydrogenase (LDH): Secondary | ICD-10-CM

## 2016-08-11 DIAGNOSIS — C83398 Diffuse large b-cell lymphoma of other extranodal and solid organ sites: Secondary | ICD-10-CM

## 2016-08-11 DIAGNOSIS — N183 Chronic kidney disease, stage 3 (moderate): Secondary | ICD-10-CM

## 2016-08-11 DIAGNOSIS — I776 Arteritis, unspecified: Secondary | ICD-10-CM

## 2016-08-11 DIAGNOSIS — C8338 Diffuse large B-cell lymphoma, lymph nodes of multiple sites: Secondary | ICD-10-CM | POA: Diagnosis not present

## 2016-08-11 DIAGNOSIS — D649 Anemia, unspecified: Secondary | ICD-10-CM | POA: Diagnosis not present

## 2016-08-11 DIAGNOSIS — I429 Cardiomyopathy, unspecified: Secondary | ICD-10-CM | POA: Diagnosis not present

## 2016-08-11 DIAGNOSIS — R740 Nonspecific elevation of levels of transaminase and lactic acid dehydrogenase [LDH]: Secondary | ICD-10-CM

## 2016-08-11 LAB — CBC & DIFF AND RETIC
BASO%: 0.3 % (ref 0.0–2.0)
BASOS ABS: 0 10*3/uL (ref 0.0–0.1)
EOS%: 1.7 % (ref 0.0–7.0)
Eosinophils Absolute: 0.1 10*3/uL (ref 0.0–0.5)
HEMATOCRIT: 34.7 % — AB (ref 38.4–49.9)
HGB: 11.1 g/dL — ABNORMAL LOW (ref 13.0–17.1)
Immature Retic Fract: 9.7 % (ref 3.00–10.60)
LYMPH%: 6.5 % — ABNORMAL LOW (ref 14.0–49.0)
MCH: 33.1 pg (ref 27.2–33.4)
MCHC: 32 g/dL (ref 32.0–36.0)
MCV: 103.6 fL — ABNORMAL HIGH (ref 79.3–98.0)
MONO#: 0.6 10*3/uL (ref 0.1–0.9)
MONO%: 10.7 % (ref 0.0–14.0)
NEUT#: 4.7 10*3/uL (ref 1.5–6.5)
NEUT%: 80.8 % — AB (ref 39.0–75.0)
PLATELETS: 169 10*3/uL (ref 140–400)
RBC: 3.35 10*6/uL — ABNORMAL LOW (ref 4.20–5.82)
RDW: 16.3 % — ABNORMAL HIGH (ref 11.0–14.6)
Retic %: 1.78 % (ref 0.80–1.80)
Retic Ct Abs: 59.63 10*3/uL (ref 34.80–93.90)
WBC: 5.9 10*3/uL (ref 4.0–10.3)
lymph#: 0.4 10*3/uL — ABNORMAL LOW (ref 0.9–3.3)

## 2016-08-11 LAB — LACTATE DEHYDROGENASE: LDH: 302 U/L — AB (ref 125–245)

## 2016-08-11 LAB — COMPREHENSIVE METABOLIC PANEL
ALT: 27 U/L (ref 0–55)
AST: 28 U/L (ref 5–34)
Albumin: 3.6 g/dL (ref 3.5–5.0)
Alkaline Phosphatase: 80 U/L (ref 40–150)
Anion Gap: 11 mEq/L (ref 3–11)
BUN: 39.7 mg/dL — ABNORMAL HIGH (ref 7.0–26.0)
CALCIUM: 11.2 mg/dL — AB (ref 8.4–10.4)
CHLORIDE: 105 meq/L (ref 98–109)
CO2: 23 mEq/L (ref 22–29)
CREATININE: 2.2 mg/dL — AB (ref 0.7–1.3)
EGFR: 27 mL/min/{1.73_m2} — ABNORMAL LOW (ref >90)
Glucose: 97 mg/dl (ref 70–140)
POTASSIUM: 4.6 meq/L (ref 3.5–5.1)
Sodium: 138 mEq/L (ref 136–145)
Total Bilirubin: 0.53 mg/dL (ref 0.20–1.20)
Total Protein: 7.2 g/dL (ref 6.4–8.3)

## 2016-08-11 LAB — URIC ACID: Uric Acid, Serum: 8.4 mg/dl — ABNORMAL HIGH (ref 2.6–7.4)

## 2016-08-11 NOTE — Telephone Encounter (Signed)
Per Tiffany @ IR regarding port Placement, patient will receive a call from IR once clearance for the patient has been received from Dr Nishan/Cardiologist for patient to hold off PLAVIX for 5 days. Labs was added for today. Chemo class scheduled for 08/12/16 with R-CEOP/CHEMO starting on next available date of 04/10, 04/11 & 04/12, per 08/11/16 los. Desk nurse advised. Patient was given a copy of the AVs report and appointment schedule per 08/11/16 los.

## 2016-08-11 NOTE — Telephone Encounter (Signed)
Chemo class rescheduled from 03/29 to 04/03 per patient request. 08/11/16

## 2016-08-12 ENCOUNTER — Other Ambulatory Visit: Payer: Medicare Other

## 2016-08-12 LAB — HEPATITIS B SURFACE ANTIGEN: HBsAg Screen: NEGATIVE

## 2016-08-12 LAB — HEPATITIS C ANTIBODY

## 2016-08-12 LAB — HEPATITIS B CORE ANTIBODY, TOTAL: HEP B C TOTAL AB: NEGATIVE

## 2016-08-13 MED ORDER — PROCHLORPERAZINE MALEATE 10 MG PO TABS: 10.0000 mg | ORAL_TABLET | Freq: Four times a day (QID) | ORAL | 6 refills | Status: DC | PRN

## 2016-08-13 MED ORDER — DEXAMETHASONE 4 MG PO TABS: 4.0000 mg | ORAL_TABLET | Freq: Two times a day (BID) | ORAL | 0 refills | Status: DC

## 2016-08-13 MED ORDER — PREDNISONE 20 MG PO TABS: 60.0000 mg | ORAL_TABLET | Freq: Every day | ORAL | 6 refills | Status: DC

## 2016-08-13 MED ORDER — LIDOCAINE-PRILOCAINE 2.5-2.5 % EX CREA: TOPICAL_CREAM | CUTANEOUS | 3 refills | Status: DC

## 2016-08-13 MED ORDER — ALLOPURINOL 100 MG PO TABS
100.0000 mg | ORAL_TABLET | Freq: Two times a day (BID) | ORAL | 0 refills | Status: DC
Start: 2016-08-13 — End: 2016-09-10

## 2016-08-13 MED ORDER — ONDANSETRON HCL 8 MG PO TABS: 8.0000 mg | ORAL_TABLET | Freq: Two times a day (BID) | ORAL | 1 refills | Status: DC | PRN

## 2016-08-13 NOTE — Progress Notes (Signed)
[name]    HEMATOLOGY/ONCOLOGY CLINIC NOTE  Date of Service: .08/11/2016  Patient Care Team: Donato Heinz, MD as PCP - General (Nephrology) Elsie Stain, MD as Consulting Physician (Pulmonary Disease)  CHIEF COMPLAINTS/PURPOSE OF CONSULTATION:  Diffuse large B cell lymphoma of the small intestine  HISTORY OF PRESENTING ILLNESS:   Luke Mcbride is a wonderful 81 y.o. male who has been referred to [place] by Dr .Donetta Potts, MD and Mauricio Po MD for evaluation and management of newly diagnosed diffuse large B-cell lymphoma of the small bowel.  Patient has a history of multiple medical comorbidities as noted below including coronary artery disease status post PCI and CABG, chronic kidney disease stage III, Barrett's esophagus, hypertension, dyslipidemia, interstitial lung disease, GERD, vasculitis involving the lungs and kidneys on chronic Imuran therapy.  He was having upper abdominal discomfort for a few months with loss of about 15-20 pounds. He had a CT scan of the abdomen done by Dr. Havery Moros on 06/15/2016 which showed a 3.3 x 3.2 x 2.4 cm indeterminate lesion involving the left upper quadrant small bowel loops. No additional small bowel lesions noted. No ascites. No retroperitoneal mesenteric or pelvic lymphadenopathy noted.  Patient was seen by Dr. Serita Grammes and underwent a diagnostic laparoscopy with small bowel resection on 07/12/2016. Pathology showed that the lesion was consistent with diffuse large B-cell lymphoma 2 cm in length and 4 cm in width centrally ulcerated mass involving 100% of the bowel circumference. The tumor was about 1 cm thick invading through the entire wall and into the underlying mesenteric fat. The tumor also abuts and focally involve the serosa. No extramural satellite lymph nodes noted. Did not seem to involve the peri-intestinal lymph nodes. Tumor was noted to have a germinal center phenotype.  Patient is healing well from surgery.  Continues to be on Imuran 100 mg by mouth daily. Has not reported any other peripheral enlarged lymph nodes. He is starting to eat better.   INTERVAL HISTORY  Patient is here for follow-up regarding his diagnosis of DLBCL after having had a PET/CT scan. He has healed well from surgery. PET/CT scan unfortunately shows extensive involvement with diffuse large B-cell lymphoma. We discussed that his significant medical comorbidities make his treatment fairly challenging. She has cardiomyopathy with an ejection fraction of 40% which precludes the use of anthracyclines. We plan to treat him with R CEOP. He has had chemotherapy counseling this morning. He is scheduled for port placement next week as well as for chemotherapy. He is on Imuran for a immune vasculitis affecting his lungs and kidneys from the past. I discussed with his nephrologist Dr Marval Regal who is okay with [place] taking him off Imuran since his immunotherapy would include Rituxan among other things. Received message from Dr. Donne Hazel that he has healed well from surgery and can proceed with treatment. I had an extensive discussion with the patient and his wife regarding the diagnosis, natural history, prognosis, treatment considerations and limitations given his extensive medical comorbidities. They asked several questions and which were answered in great details. Patient is agreeable with proceeding with the planned treatment.   MEDICAL HISTORY:  Past Medical History:  Diagnosis Date  . ALLERGIC RHINITIS   . Anemia    hx  . Barrett's esophagus   . BOOP (bronchiolitis obliterans with organizing pneumonia) ([place])    a. s/p R VATS 2008.  [name] CAD (coronary artery disease)    a. 05/2000: NSTEMI/CABG x 6: LIMA->LAD, VG->D1, VG->OM1->2, VG->PDA->RPL;  b. 07/2007  MV: high lat infarct, no ischemia, EF 47%;  c. Cath/PCI: LM nl, LAD20p, 26m, D1 nl, D2 60-70ost, D3 nl, LCX 70ost, 169m, OM1/OM2 min irregs, RCA 30 diff, PDA 99, LIMA->LAD atretic,  VG->D1 100, VG->OM1->2 100, VG->PDA->RPL 90p (4.0x23 Vision BMS);  c. 07/2012 Echo: EF 55%, gr1 DD.  [name] Cataract   . CKD (chronic kidney disease), stage III    a. renal bx 2008: GLN with vasculitis  . Diffuse large B cell lymphoma ([place]) 08/07/2016  . GERD (gastroesophageal reflux disease)   . Glaucoma    a. Cannot see out of L eye.  . Hematuria    Microscopic  . Hyperglycemia    Patient reported while on prednisone, had to take insulin  . Hyperlipemia   . Hypertension   . Hypothyroidism   . ILD (interstitial lung disease) ([place])   . Iritis   . Local infection of skin and subcutaneous tissue   . Membranoproliferative nephritis   . Myocardial infarction 2002  . Neutropenia, drug-induced ([place])   . Recurrent boils   . Residual foreign body in soft tissue   . Vasculitis ([place])    a.  pauciimmune vasculitis with renal involvement and hx of transient hemoptysis in the past with associated BOOP, 2008 (renal bx 2008: GLN with vasculitis). b. History of treatment with 2 cycles of Cytoxan and pheresis. H/o hemoptysis and pulm hemorrhage with 2nd cycle of cytoxan.    SURGICAL HISTORY: Past Surgical History:  Procedure Laterality Date  . BOWEL RESECTION N/A 07/12/2016   Procedure: SMALL BOWEL RESECTION;  Surgeon: Rolm Bookbinder, MD;  Location: [facility];  Service: General;  Laterality: N/A;  . CATARACT EXTRACTION Bilateral   . CORONARY ARTERY BYPASS GRAFT  2002  . LAPAROSCOPY N/A 07/12/2016   Procedure: LAPAROSCOPY DIAGNOSTIC;  Surgeon: Rolm Bookbinder, MD;  Location: [facility];  Service: General;  Laterality: N/A;  . LEFT HEART CATHETERIZATION WITH CORONARY/GRAFT ANGIOGRAM N/A 07/26/2012   Procedure: LEFT HEART CATHETERIZATION WITH Beatrix Fetters;  Surgeon: Wellington Hampshire, MD;  Location: [facility];  Service: Cardiovascular;  Laterality: N/A;  . LUNG BIOPSY    . PERCUTANEOUS CORONARY STENT INTERVENTION (PCI-S)  07/26/2012   Procedure: PERCUTANEOUS CORONARY STENT INTERVENTION (PCI-S);   Surgeon: Wellington Hampshire, MD;  Location: [facility] CATH LAB;  Service: Cardiovascular;;  . RENAL BIOPSY    . TOE AMPUTATION  2009   hammer toe    SOCIAL HISTORY: Social History   Social History  . Marital status: Married    Spouse name: N/A  . Number of children: N/A  . Years of education: N/A   Occupational History  . [place] History Main Topics  . Smoking status: Never Smoker  . Smokeless tobacco: Never Used  . Alcohol use No  . Drug use: No  . Sexual activity: Not on file   Other Topics Concern  . Not on file   Social History Narrative   Regular exercise - yes      [facility] (07/12/16):   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  no pets currently. No bird or mold exposure.    FAMILY HISTORY: Family History  Problem Relation Age of Onset  . Heart disease Mother   . Diabetes Mother   . Prostate cancer Father   . Depression Other   . Diabetes Other   . Prostate cancer Other   . Colon polyps Neg Hx   . Colon cancer Neg Hx   .  Rectal cancer Neg Hx   . Stomach cancer Neg Hx   . Esophageal cancer Neg Hx     ALLERGIES:  is allergic to no known allergies.  MEDICATIONS:  Current Outpatient Prescriptions  Medication Sig Dispense Refill  . acetaminophen (TYLENOL) 500 MG tablet Take 500-1,000 mg by mouth daily as needed for moderate pain or headache.    [name] aspirin EC 81 MG EC tablet Take 1 tablet (81 mg total) by mouth daily. 30 tablet   . azaTHIOprine (IMURAN) 50 MG tablet TAKE 2 TABLETS(100MG ) BY MOUTH IN THE AM AND 1 TABLET(50MG ) BY MOUTH IN THE PM    . clopidogrel (PLAVIX) 75 MG tablet Take 1 tablet (75 mg total) by mouth daily. 90 tablet 2  . finasteride (PROSCAR) 5 MG tablet Take 5 mg by mouth at bedtime.     [name] levothyroxine (SYNTHROID, LEVOTHROID) 100 MCG tablet Take 100 mcg by mouth daily.      . metoprolol tartrate (LOPRESSOR) 25 MG tablet Take 0.5 tablets (12.5 mg total) by mouth 2 (two) times daily. 90 tablet 2  .  Multiple Vitamin (MULTIVITAMIN) tablet Take 1 tablet by mouth daily.      [name] NITROSTAT 0.4 MG SL tablet PLACE 1 TABLET (0.4 MG TOTAL) UNDER THE TONGUE EVERY 5 (FIVE) MINUTES AS NEEDED. MAY REPEAT X3 (Patient taking differently: Place 0.4 mg under the tongue every 5 (five) minutes as needed for chest pain. May repeat x3) 25 tablet 2  . pantoprazole (PROTONIX) 40 MG tablet TAKE 1 TABLET (40 MG TOTAL) BY MOUTH DAILY. 90 tablet 2  . rosuvastatin (CRESTOR) 10 MG tablet Take 10 mg by mouth at bedtime.     . Tamsulosin HCl (FLOMAX) 0.4 MG CAPS Take 0.4 mg by mouth at bedtime.     [name] telmisartan (MICARDIS) 40 MG tablet Take 20 mg by mouth daily.      No current facility-administered medications for this visit.     REVIEW OF SYSTEMS:    10 Point review of Systems was done is negative except as noted above.  PHYSICAL EXAMINATION: ECOG PERFORMANCE STATUS: 2 - Symptomatic, <50% confined to bed  . Vitals:   08/11/16 1224  BP: 113/61  Pulse: 89  Resp: 18  Temp: 98.4 F (36.9 C)   Filed Weights   08/11/16 1224  Weight: 220 lb 8 oz (100 kg)   .Body mass index is 29.91 kg/m.  GENERAL:alert, in no acute distress and comfortable SKIN: no acute rashes, no significant lesions EYES: conjunctiva are pink and non-injected, sclera anicteric OROPHARYNX: MMM, no exudates, no oropharyngeal erythema or ulceration NECK: supple, no JVD LYMPH:  no palpable lymphadenopathy in the cervical, axillary or inguinal regions LUNGS: clear to auscultation b/l with normal respiratory effort HEART: regular rate & rhythm ABDOMEN:  normoactive bowel sounds , non tender, not distended. Healed laparoscopic surgical scars Extremity: no pedal edema PSYCH: alert & oriented x 3 with fluent speech NEURO: no focal motor/sensory deficits  LABORATORY DATA:  I have reviewed the data as listed  . CBC Latest Ref Rng & Units 08/11/2016 07/29/2016 07/13/2016  WBC 4.0 - 10.3 10e3/uL 5.9 6.1 6.5  Hemoglobin 13.0 - 17.1 g/dL 11.1(L)  11.6(L) 10.6(L)  Hematocrit 38.4 - 49.9 % 34.7(L) 36.3(L) 33.3(L)  Platelets 140 - 400 10e3/uL 169 265 179    . CMP Latest Ref Rng & Units 08/11/2016 07/29/2016 07/15/2016  Glucose 70 - 140 mg/dl 97 105 83  BUN 7.0 - 26.0 mg/dL 39.7(H) 43.1(H) 25(H)  Creatinine 0.7 -  1.3 mg/dL 2.2(H) 2.1(H) 1.92(H)  Sodium 136 - 145 mEq/L 138 141 137  Potassium 3.5 - 5.1 mEq/L 4.6 4.8 3.8  Chloride 101 - 111 mmol/L - - 108  CO2 22 - 29 mEq/L 23 22 23   Calcium 8.4 - 10.4 mg/dL 11.2(H) 11.4(H) 8.1(L)  Total Protein 6.4 - 8.3 g/dL 7.2 7.7 -  Total Bilirubin 0.20 - 1.20 mg/dL 0.53 0.46 -  Alkaline Phos 40 - 150 U/L 80 89 -  AST 5 - 34 U/L 28 29 -  ALT 0 - 55 U/L 27 30 -   . Lab Results  Component Value Date   LDH 302 (H) 08/11/2016   Component     Latest Ref Rng & Units 08/11/2016  Uric Acid, Serum     2.6 - 7.4 mg/dl 8.4 (H)  Hepatitis B Surface Ag     Negative Negative  Hep B Core Ab, Tot     Negative Negative  Hep C Virus Ab     0.0 - 0.9 s/co ratio <0.1    Myocardial perfusion scan 06/30/2016 Study Highlights     The left ventricular ejection fraction is moderately decreased (30-44%).  Nuclear stress EF: 40%.  There was no ST segment deviation noted during stress.  No T wave inversion was noted during stress.  Defect 1: There is a large defect of severe severity present in the basal inferolateral, basal anterolateral, mid inferolateral and mid anterolateral location.  Findings consistent with prior myocardial infarction.  This is an intermediate risk study.   Intermediate risk stress nuclear study with a large scar in the left circumflex coronary artery distribution moderate reduction in left ventricular systolic function.     RADIOGRAPHIC STUDIES: I have personally reviewed the radiological images as listed and agreed with the findings in the report. Nm Pet Image Initial (pi) Skull Base To Thigh  Result Date: 08/06/2016 CLINICAL DATA:  Initial treatment strategy for  diffuse large B-cell non-Hodgkin's lymphoma. EXAM: NUCLEAR MEDICINE PET SKULL BASE TO THIGH TECHNIQUE: 10.8 mCi F-18 FDG was injected intravenously. Full-ring PET imaging was performed from the skull base to thigh after the radiotracer. CT data was obtained and used for attenuation correction and anatomic localization. FASTING BLOOD GLUCOSE:  Value: 120 mg/dl COMPARISON:  CT abdomen from 06/15/2016 FINDINGS: NECK No hypermetabolic lymph nodes in the neck. CHEST Several hypermetabolic right axillary lymph nodes, including a 0.9 cm in short axis node on image 62/4 with maximum SUV 29.4. In the pericardial adipose tissue there several hypermetabolic lymph nodes including a 1.1 cm node on image 94/4 with maximum SUV 14.7. Focal hypermetabolic activity along the left ventricular myocardium is present in the anterolateral wall, probably incidental, but somewhat notably focally prominent with maximum SUV 9.8. Coronary, aortic arch, and branch vessel atherosclerotic vascular disease. Prior CABG. There is evidence of old granulomatous disease including a calcified granuloma posteriorly in the right upper lobe on image 29/8. ABDOMEN/PELVIS Innumerable hypermetabolic lesions are present throughout the liver including tracking along the liver capsule. One larger lesion in segment 4 has a maximum SUV of 28.9, and a long-axis diameter of the hypermetabolic activity at 4.9 cm. Small foci of hypermetabolic activity are present along the margin of the spleen. Hypermetabolic porta hepatis and celiac chain lymph nodes are present and there scattered hypermetabolic nodules along the upper omentum. A proximal celiac chain lymph node with short axis diameter of 1.1 cm on image 112 of series 4 has a maximum SUV of 22.5 an omental lesion measuring 8  mm in diameter anterior to the transverse colon on image 112/4 has a maximum SUV of 6.8. There are hypodense renal lesions. I am uncertain whether the focal hypermetabolic activity along the  left renal collecting system is due to the adjacent hypodense lesion or collecting system activity, I favor the latter. Tiny hypermetabolic retroperitoneal lymph nodes are present. A hypermetabolic omental tumor deposit incorporated into a small Spigelian hernia on the left measures 0.9 by 1.0 cm on image 176/4 and has a maximum SUV of 15.9. SKELETON Abnormal focal hypermetabolic activity in the anterior wall of the left acetabulum has maximum SUV of 29.4, compatible with malignancy. This is relatively inconspicuous on the CT data. A similar lesion in the left iliac bone has a maximum SUV of 22.8. Anterolisthesis at L5-S1 with suspected pars defects at L5. Small hernias of adipose tissue are present along the midline laparotomy scar. IMPRESSION: 1. In addition to hypermetabolic nodal involvement of the right axilla, pericardial space, porta hepatis/mesenteric root, and retroperitoneum, there is extensive involvement of the liver as well as peritoneal involvement along the liver surface, splenic surface, and upper omentum. 2. Hypermetabolic bony lesions in the left pelvis compatible with malignant involvement. 3. There is a focus of hypermetabolic activity along the anterolateral wall of the left ventricle. Normally such findings turn out to simply be hypermetabolic myocardium. Strictly speaking given how focal this appears I cannot completely exclude the possibility of tumor involvement within or along the myocardium. Electronically Signed   By: Van Clines M.D.   On: 08/06/2016 12:48   Dg Abd Portable 1v  Result Date: 07/15/2016 CLINICAL DATA:  Ileus following gastrointestinal surgery. EXAM: PORTABLE ABDOMEN - 1 VIEW COMPARISON:  06/15/2016. FINDINGS: Surgical staples over the lower abdomen are identified. Based on this AP portable supine radiograph, prominence of the small and large bowel is consistent with ileus. Cannot definitively assess for air-fluid levels or pneumoperitoneum based on this limited  exam. IMPRESSION: Findings consistent with ileus.  See discussion above. Electronically Signed   By: Staci Righter M.D.   On: 07/15/2016 07:55   CT abdomen/pelvis (06/15/2016) IMPRESSION: 1. Small bowel lesion is identified within the left upper quadrant the abdomen. Finding is worrisome for non obstructing small bowel neoplasm. 2. No evidence for metastatic adenopathy or distant metastatic disease. 3. Aortic atherosclerosis and infrarenal abdominal aortic aneurysm. Recommend followup by ultrasound in 3 years. This recommendation follows ACR consensus guidelines: White Paper of the ACR Incidental Findings Committee II on Vascular Findings. Natasha Mead Coll Radiol 2013; 10:789-794   Electronically Signed   By: Kerby Moors M.D.   On: 06/15/2016 15:14  ASSESSMENT & PLAN:   81 yo male with multiple medical co-morbidities including cardiac co-morbidities, IPF, vascular on chronic immunosupression with   1) Newly diagnosed Diffuse large B-cell lymphoma involving the small intestine (germinal cell phenotype) with complete thickness involvement of bowel and abutting serosa. Patient on labs today has elevated LDH of 305 and hypercalcemia concerning for possible presence of more extensive DLBCL  PET/CT scan shows extensive involvement with DLBCL, hypermetabolic nodal involvement of the right axilla, pericardial space, porta hepatis/mesenteric root, and retroperitoneum, there is extensive involvement of the liver as well as peritoneal involvement along the liver surface, splenic surface, and upper omentum. 2. Hypermetabolic bony lesions in the left pelvis compatible with malignant involvement. 3. There is a focus of hypermetabolic activity along the anterolateral wall of the left ventricle.   2) chronic kidney disease stage III baseline creatinine about 2 3) coronary disease  status post PCI and CABG - had a myocardial perfusion imaging scan on 06/30/2016 that showed a moderately decreased ejection  fraction of 30-44%- this would likely include anthracycline use. 4) history of pulmonary and renal vasculitis on chronic Imuran Plan -PET/CT scan unfortunately shows extensive involvement with diffuse large B-cell lymphoma. -We discussed that his significant medical comorbidities make his treatment fairly challenging. She has cardiomyopathy with an ejection fraction of 40% which precludes the use of anthracyclines. -We plan to treat him with R CEOP.  -He has had chemotherapy counseling this morning. -He is scheduled for port placement next week as well as for chemotherapy. -He is on Imuran for a immune vasculitis affecting his lungs and kidneys from the past. I discussed with his nephrologist Dr Marval Regal who is okay with [place] taking him off Imuran since his immunotherapy would include Rituxan among other chemotherapy. "will plan to start him on dexamethasone 4mg  po BID and reduced imuran to 50mg  po daily and then stop imuran on 08/19/2016)" -start Allopurinol for TLS prophylaxis from 08/19/2016 after patient is off Imuran -Received message from Dr. Donne Hazel that he has healed well from surgery and can proceed with treatment. -I had an extensive discussion with the patient and his wife regarding the diagnosis, natural history, prognosis, treatment considerations and limitations given his extensive medical comorbidities. They asked several questions and which were answered in great details. Patient is agreeable with proceeding with the planned treatment.   Labs today Chemo-counseling for R-CEOP IR port placement  Start R-CEOP with neulasta support ASAP next week RTC with Dr Irene Limbo 1 week after chemotherapy for toxicity check   All of the patients questions were answered with apparent satisfaction. The patient knows to call the clinic with any problems, questions or concerns.  I spent 40 minutes counseling the patient face to face. The total time spent in the appointment was 50 minutes and more than  50% was on counseling and direct patient cares.    Sullivan Lone MD [place] AAHIVMS [facility] Hematology/Oncology Physician [facility]  (Office):       [phone] (Work cell):  [phone] (Fax):           [phone]

## 2016-08-16 ENCOUNTER — Encounter: Payer: Self-pay | Admitting: *Deleted

## 2016-08-16 ENCOUNTER — Telehealth: Payer: Self-pay | Admitting: *Deleted

## 2016-08-16 NOTE — Telephone Encounter (Signed)
-----   Message from Brunetta Genera, MD sent at 08/13/2016  4:38 PM EDT ----- Regarding: Premedication/Chemotherapy instructions-Urgent Loren/Jarrah Seher, Please let patient know  -to pick up his premedications from his pharmacy -he needs to start taking dexamethasone 4mg  po BID and reduce imuran to 50mg  po daily now and then stop imuran on 08/19/2016 and continue dexamethasone till the day before his chemotherapy -start Allopurinol for TLS prophylaxis from 08/19/2016 after patient is off Imuran.  Thanks, GK

## 2016-08-16 NOTE — Telephone Encounter (Signed)
Informed patient and verbalized understanding.

## 2016-08-17 ENCOUNTER — Encounter: Payer: Self-pay | Admitting: *Deleted

## 2016-08-17 ENCOUNTER — Other Ambulatory Visit: Payer: Medicare Other

## 2016-08-19 ENCOUNTER — Other Ambulatory Visit: Payer: Self-pay | Admitting: General Surgery

## 2016-08-20 ENCOUNTER — Ambulatory Visit (HOSPITAL_COMMUNITY)
Admission: RE | Admit: 2016-08-20 | Discharge: 2016-08-20 | Disposition: A | Discharge status: Home / Self Care | Payer: Medicare Other | Source: Ambulatory Visit | Attending: Hematology | Admitting: Hematology

## 2016-08-20 ENCOUNTER — Other Ambulatory Visit: Payer: Self-pay | Admitting: Hematology

## 2016-08-20 ENCOUNTER — Encounter (HOSPITAL_COMMUNITY): Payer: Self-pay

## 2016-08-20 DIAGNOSIS — D702 Other drug-induced agranulocytosis: Secondary | ICD-10-CM | POA: Insufficient documentation

## 2016-08-20 DIAGNOSIS — H409 Unspecified glaucoma: Secondary | ICD-10-CM | POA: Diagnosis not present

## 2016-08-20 DIAGNOSIS — Z8249 Family history of ischemic heart disease and other diseases of the circulatory system: Secondary | ICD-10-CM | POA: Insufficient documentation

## 2016-08-20 DIAGNOSIS — N183 Chronic kidney disease, stage 3 (moderate): Secondary | ICD-10-CM | POA: Diagnosis not present

## 2016-08-20 DIAGNOSIS — C8330 Diffuse large B-cell lymphoma, unspecified site: Secondary | ICD-10-CM | POA: Insufficient documentation

## 2016-08-20 DIAGNOSIS — D630 Anemia in neoplastic disease: Secondary | ICD-10-CM | POA: Insufficient documentation

## 2016-08-20 DIAGNOSIS — Z955 Presence of coronary angioplasty implant and graft: Secondary | ICD-10-CM | POA: Diagnosis not present

## 2016-08-20 DIAGNOSIS — K219 Gastro-esophageal reflux disease without esophagitis: Secondary | ICD-10-CM | POA: Insufficient documentation

## 2016-08-20 DIAGNOSIS — I2510 Atherosclerotic heart disease of native coronary artery without angina pectoris: Secondary | ICD-10-CM | POA: Insufficient documentation

## 2016-08-20 DIAGNOSIS — J849 Interstitial pulmonary disease, unspecified: Secondary | ICD-10-CM | POA: Diagnosis not present

## 2016-08-20 DIAGNOSIS — I252 Old myocardial infarction: Secondary | ICD-10-CM | POA: Insufficient documentation

## 2016-08-20 DIAGNOSIS — E785 Hyperlipidemia, unspecified: Secondary | ICD-10-CM | POA: Insufficient documentation

## 2016-08-20 DIAGNOSIS — C8339 Diffuse large B-cell lymphoma, extranodal and solid organ sites: Secondary | ICD-10-CM

## 2016-08-20 DIAGNOSIS — Z951 Presence of aortocoronary bypass graft: Secondary | ICD-10-CM | POA: Diagnosis not present

## 2016-08-20 DIAGNOSIS — E039 Hypothyroidism, unspecified: Secondary | ICD-10-CM | POA: Diagnosis not present

## 2016-08-20 DIAGNOSIS — Z7902 Long term (current) use of antithrombotics/antiplatelets: Secondary | ICD-10-CM | POA: Diagnosis not present

## 2016-08-20 DIAGNOSIS — I129 Hypertensive chronic kidney disease with stage 1 through stage 4 chronic kidney disease, or unspecified chronic kidney disease: Secondary | ICD-10-CM | POA: Diagnosis not present

## 2016-08-20 DIAGNOSIS — Z452 Encounter for adjustment and management of vascular access device: Secondary | ICD-10-CM | POA: Diagnosis not present

## 2016-08-20 HISTORY — PX: IR US GUIDE VASC ACCESS RIGHT: IMG2390

## 2016-08-20 HISTORY — PX: IR FLUORO GUIDE PORT INSERTION RIGHT: IMG5741

## 2016-08-20 LAB — BASIC METABOLIC PANEL
Anion gap: 8 (ref 5–15)
BUN: 57 mg/dL — ABNORMAL HIGH (ref 6–20)
CHLORIDE: 105 mmol/L (ref 101–111)
CO2: 21 mmol/L — ABNORMAL LOW (ref 22–32)
CREATININE: 2.07 mg/dL — AB (ref 0.61–1.24)
Calcium: 9.9 mg/dL (ref 8.9–10.3)
GFR calc non Af Amer: 29 mL/min — ABNORMAL LOW (ref >60)
GFR, EST AFRICAN AMERICAN: 34 mL/min — AB (ref >60)
Glucose, Bld: 103 mg/dL — ABNORMAL HIGH (ref 65–99)
POTASSIUM: 5 mmol/L (ref 3.5–5.1)
SODIUM: 134 mmol/L — AB (ref 135–145)

## 2016-08-20 LAB — CBC
HCT: 31.1 % — ABNORMAL LOW (ref 39.0–52.0)
HEMOGLOBIN: 10.3 g/dL — AB (ref 13.0–17.0)
MCH: 33.7 pg (ref 26.0–34.0)
MCHC: 33.1 g/dL (ref 30.0–36.0)
MCV: 101.6 fL — AB (ref 78.0–100.0)
PLATELETS: 242 10*3/uL (ref 150–400)
RBC: 3.06 MIL/uL — AB (ref 4.22–5.81)
RDW: 17.9 % — ABNORMAL HIGH (ref 11.5–15.5)
WBC: 8.1 10*3/uL (ref 4.0–10.5)

## 2016-08-20 LAB — PROTIME-INR
INR: 0.97
Prothrombin Time: 12.9 seconds (ref 11.4–15.2)

## 2016-08-20 LAB — APTT: aPTT: 29 seconds (ref 24–36)

## 2016-08-20 MED ORDER — LIDOCAINE HCL 1 % IJ SOLN
INTRAMUSCULAR | Status: AC
  Filled 2016-08-20: qty 20

## 2016-08-20 MED ORDER — MIDAZOLAM HCL 2 MG/2ML IJ SOLN
INTRAMUSCULAR | Status: AC
  Filled 2016-08-20: qty 8

## 2016-08-20 MED ORDER — FENTANYL CITRATE (PF) 100 MCG/2ML IJ SOLN
INTRAMUSCULAR | Status: AC
  Filled 2016-08-20: qty 8

## 2016-08-20 MED ORDER — CEFAZOLIN SODIUM-DEXTROSE 2-4 GM/100ML-% IV SOLN
INTRAVENOUS | Status: AC
  Filled 2016-08-20: qty 100

## 2016-08-20 MED ORDER — SODIUM CHLORIDE 0.9 % IV SOLN
INTRAVENOUS | Status: DC
  Administered 2016-08-20: 12:00:00 via INTRAVENOUS

## 2016-08-20 MED ORDER — HEPARIN SOD (PORK) LOCK FLUSH 100 UNIT/ML IV SOLN
INTRAVENOUS | Status: DC
Start: 2016-08-20 — End: 2016-08-21
  Filled 2016-08-20: qty 5

## 2016-08-20 MED ORDER — FENTANYL CITRATE (PF) 100 MCG/2ML IJ SOLN
INTRAMUSCULAR | Status: AC | PRN
  Administered 2016-08-20: 50 ug via INTRAVENOUS

## 2016-08-20 MED ORDER — MIDAZOLAM HCL 2 MG/2ML IJ SOLN
INTRAMUSCULAR | Status: AC | PRN
  Administered 2016-08-20 (×2): 1 mg via INTRAVENOUS

## 2016-08-20 MED ORDER — CEFAZOLIN SODIUM-DEXTROSE 2-4 GM/100ML-% IV SOLN
2.0000 g | INTRAVENOUS | Status: AC
  Administered 2016-08-20: 2 g via INTRAVENOUS

## 2016-08-20 NOTE — Discharge Instructions (Signed)
Moderate Conscious Sedation, Adult, Care After °These instructions provide you with information about caring for yourself after your procedure. Your health care provider may also give you more specific instructions. Your treatment has been planned according to current medical practices, but problems sometimes occur. Call your health care provider if you have any problems or questions after your procedure. °What can I expect after the procedure? °After your procedure, it is common: °· To feel sleepy for several hours. °· To feel clumsy and have poor balance for several hours. °· To have poor judgment for several hours. °· To vomit if you eat too soon. °Follow these instructions at home: °For at least 24 hours after the procedure:  ° °· Do not: °¨ Participate in activities where you could fall or become injured. °¨ Drive. °¨ Use heavy machinery. °¨ Drink alcohol. °¨ Take sleeping pills or medicines that cause drowsiness. °¨ Make important decisions or sign legal documents. °¨ Take care of children on your own. °· Rest. °Eating and drinking  °· Follow the diet recommended by your health care provider. °· If you vomit: °¨ Drink water, juice, or soup when you can drink without vomiting. °¨ Make sure you have little or no nausea before eating solid foods. °General instructions  °· Have a responsible adult stay with you until you are awake and alert. °· Take over-the-counter and prescription medicines only as told by your health care provider. °· If you smoke, do not smoke without supervision. °· Keep all follow-up visits as told by your health care provider. This is important. °Contact a health care provider if: °· You keep feeling nauseous or you keep vomiting. °· You feel light-headed. °· You develop a rash. °· You have a fever. °Get help right away if: °· You have trouble breathing. °This information is not intended to replace advice given to you by your health care provider. Make sure you discuss any questions you  have with your health care provider. °Document Released: 02/21/2013 Document Revised: 10/06/2015 Document Reviewed: 08/23/2015 °Elsevier Interactive Patient Education © 2017 [employer] ° ° °Implanted Port Home Guide °An implanted port is a type of central line that is placed under the skin. Central lines are used to provide IV access when treatment or nutrition needs to be given through a person’s veins. Implanted ports are used for long-term IV access. An implanted port may be placed because: °· You need IV medicine that would be irritating to the small veins in your hands or arms. °· You need long-term IV medicines, such as antibiotics. °· You need IV nutrition for a long period. °· You need frequent blood draws for lab tests. °· You need dialysis. °Implanted ports are usually placed in the chest area, but they can also be placed in the upper arm, the abdomen, or the leg. An implanted port has two main parts: °· Reservoir. The reservoir is round and will appear as a small, raised area under your skin. The reservoir is the part where a needle is inserted to give medicines or draw blood. °· Catheter. The catheter is a thin, flexible tube that extends from the reservoir. The catheter is placed into a large vein. Medicine that is inserted into the reservoir goes into the catheter and then into the vein. °How will I care for my incision site? °Do not get the incision site wet. Bathe or shower as directed by your health care provider. °How is my port accessed? °Special steps must be taken to access the   port: °· Before the port is accessed, a numbing cream can be placed on the skin. This helps numb the skin over the port site. °· Your health care provider uses a sterile technique to access the port. °¨ Your health care provider must put on a mask and sterile gloves. °¨ The skin over your port is cleaned carefully with an antiseptic and allowed to dry. °¨ The port is gently pinched between sterile gloves, and a needle  is inserted into the port. °· Only "non-coring" port needles should be used to access the port. Once the port is accessed, a blood return should be checked. This helps ensure that the port is in the vein and is not clogged. °· If your port needs to remain accessed for a constant infusion, a clear (transparent) bandage will be placed over the needle site. The bandage and needle will need to be changed every week, or as directed by your health care provider. °· Keep the bandage covering the needle clean and dry. Do not get it wet. Follow your health care provider’s instructions on how to take a shower or bath while the port is accessed. °· If your port does not need to stay accessed, no bandage is needed over the port. °What is flushing? °Flushing helps keep the port from getting clogged. Follow your health care provider’s instructions on how and when to flush the port. Ports are usually flushed with saline solution or a medicine called heparin. The need for flushing will depend on how the port is used. °· If the port is used for intermittent medicines or blood draws, the port will need to be flushed: °¨ After medicines have been given. °¨ After blood has been drawn. °¨ As part of routine maintenance. °· If a constant infusion is running, the port may not need to be flushed. °How long will my port stay implanted? °The port can stay in for as long as your health care provider thinks it is needed. When it is time for the port to come out, surgery will be done to remove it. The procedure is similar to the one performed when the port was put in. °When should I seek immediate medical care? °When you have an implanted port, you should seek immediate medical care if: °· You notice a bad smell coming from the incision site. °· You have swelling, redness, or drainage at the incision site. °· You have more swelling or pain at the port site or the surrounding area. °· You have a fever that is not controlled with medicine. °This  information is not intended to replace advice given to you by your health care provider. Make sure you discuss any questions you have with your health care provider. °Document Released: 05/03/2005 Document Revised: 10/09/2015 Document Reviewed: 01/08/2013 °Elsevier Interactive Patient Education © 2017 [employer]. ° ° °Implanted Port Insertion, Care After °This sheet gives you information about how to care for yourself after your procedure. Your health care provider may also give you more specific instructions. If you have problems or questions, contact your health care provider. °What can I expect after the procedure? °After your procedure, it is common to have: °· Discomfort at the port insertion site. °· Bruising on the skin over the port. This should improve over 3-4 days. °Follow these instructions at home: °Port care  °· After your port is placed, you will get a manufacturer's information card. The card has information about your port. Keep this card with you at all   times. °· Take care of the port as told by your health care provider. Ask your health care provider if you or a family member can get training for taking care of the port at home. A home health care nurse may also take care of the port. °· Make sure to remember what type of port you have. °Incision care  °· Follow instructions from your health care provider about how to take care of your port insertion site. Make sure you: °¨ Wash your hands with soap and water before you change your bandage (dressing). If soap and water are not available, use hand sanitizer. °¨ Change your dressing as told by your health care provider. °¨ Leave stitches (sutures), skin glue, or adhesive strips in place. These skin closures may need to stay in place for 2 weeks or longer. If adhesive strip edges start to loosen and curl up, you may trim the loose edges. Do not remove adhesive strips completely unless your health care provider tells you to do that. °· Check your  port insertion site every day for signs of infection. Check for: °¨ More redness, swelling, or pain. °¨ More fluid or blood. °¨ Warmth. °¨ Pus or a bad smell. °General instructions  °· Do not take baths, swim, or use a hot tub until your health care provider approves. °· Do not lift anything that is heavier than 10 lb (4.5 kg) for a week, or as told by your health care provider. °· Ask your health care provider when it is okay to: °¨ Return to work or school. °¨ Resume usual physical activities or sports. °· Do not drive for 24 hours if you were given a medicine to help you relax (sedative). °· Take over-the-counter and prescription medicines only as told by your health care provider. °· Wear a medical alert bracelet in case of an emergency. This will tell any health care providers that you have a port. °· Keep all follow-up visits as told by your health care provider. This is important. °Contact a health care provider if: °· You cannot flush your port with saline as directed, or you cannot draw blood from the port. °· You have a fever or chills. °· You have more redness, swelling, or pain around your port insertion site. °· You have more fluid or blood coming from your port insertion site. °· Your port insertion site feels warm to the touch. °· You have pus or a bad smell coming from the port insertion site. °Get help right away if: °· You have chest pain or shortness of breath. °· You have bleeding from your port that you cannot control. °Summary °· Take care of the port as told by your health care provider. °· Change your dressing as told by your health care provider. °· Keep all follow-up visits as told by your health care provider. °This information is not intended to replace advice given to you by your health care provider. Make sure you discuss any questions you have with your health care provider. °Document Released: 02/21/2013 Document Revised: 03/24/2016 Document Reviewed: 03/24/2016 °Elsevier Interactive  Patient Education © 2017 Elsevier Inc. ° °

## 2016-08-20 NOTE — Procedures (Signed)
Interventional Radiology Procedure Note  Procedure: Placement of a right IJ approach single lumen PowerPort.  Tip is positioned at the superior cavoatrial junction and catheter is ready for immediate use.  Complications: None Recommendations:  - Ok to shower tomorrow - Do not submerge for 7 days - Routine line care   Signed,  Nazire Fruth S. Chaysen Tillman, DO   

## 2016-08-20 NOTE — H&P (Signed)
Chief Complaint: B-cell lymphoma of the small bowel  Referring Physician:Dr. Sullivan Lone  Supervising Physician: Corrie Mckusick  Patient Status: [facility] - Out-pt  HPI: Luke Mcbride is an 81 y.o. male who was admitted with a SBO about 5 weeks ago.  He ultimately was taken to the OR for a small bowel resection.  His pathology revealed lymphoma. He has been followed by Dr. Irene Limbo recently and will start chemotherapy next Tuesday.  He presents today for placement of a PAC.  He takes plavix and this has been held since Sunday.  He has no other complaints.  Past Medical History:  Past Medical History:  Diagnosis Date  . ALLERGIC RHINITIS   . Anemia    hx  . Barrett's esophagus   . BOOP (bronchiolitis obliterans with organizing pneumonia) ([place])    a. s/p R VATS 2008.  [name] CAD (coronary artery disease)    a. 05/2000: NSTEMI/CABG x 6: LIMA->LAD, VG->D1, VG->OM1->2, VG->PDA->RPL;  b. 07/2007 MV: high lat infarct, no ischemia, EF 47%;  c. Cath/PCI: LM nl, LAD20p, 44m D1 nl, D2 60-70ost, D3 nl, LCX 70ost, 1042mOM1/OM2 min irregs, RCA 30 diff, PDA 99, LIMA->LAD atretic, VG->D1 100, VG->OM1->2 100, VG->PDA->RPL 90p (4.0x23 Vision BMS);  c. 07/2012 Echo: EF 55%, gr1 DD.  . Marland Kitchenataract   . CKD (chronic kidney disease), stage III    a. renal bx 2008: GLN with vasculitis  . Diffuse large B cell lymphoma ([place]  . GERD (gastroesophageal reflux disease)   . Glaucoma    a. Cannot see out of L eye.  . Hematuria    Microscopic  . Hyperglycemia    Patient reported while on prednisone, had to take insulin  . Hyperlipemia   . Hypertension   . Hypothyroidism   . ILD (interstitial lung disease) ([facility] [place]  . Iritis   . Local infection of skin and subcutaneous tissue   . Membranoproliferative nephritis   . Myocardial infarction 2002  . Neutropenia, drug-induced ([place]  . Recurrent boils   . Residual foreign body in soft tissue   . Vasculitis ([place]   a.  pauciimmune vasculitis with renal involvement  and hx of transient hemoptysis in the past with associated BOOP, 2008 (renal bx 2008: GLN with vasculitis). b. History of treatment with 2 cycles of Cytoxan and pheresis. H/o hemoptysis and pulm hemorrhage with 2nd cycle of cytoxan.    Past Surgical History:  Past Surgical History:  Procedure Laterality Date  . BOWEL RESECTION N/A 07/12/2016   Procedure: SMALL BOWEL RESECTION;  Surgeon: MaRolm BookbinderMD;  [facility] Service: General;  Laterality: N/A;  . CATARACT EXTRACTION Bilateral   . CORONARY ARTERY BYPASS GRAFT  2002  . LAPAROSCOPY N/A 07/12/2016   Procedure: LAPAROSCOPY DIAGNOSTIC;  Surgeon: MaRolm BookbinderMD;  [facility] Service: General;  Laterality: N/A;  . LEFT HEART CATHETERIZATION WITH CORONARY/GRAFT ANGIOGRAM N/A 07/26/2012   Procedure: LEFT HEART CATHETERIZATION WITH COBeatrix Fetters Surgeon: MuWellington HampshireMD;  [facility];  Service: Cardiovascular;  Laterality: N/A;  . LUNG BIOPSY    . PERCUTANEOUS CORONARY STENT INTERVENTION (PCI-S)  07/26/2012   Procedure: PERCUTANEOUS CORONARY STENT INTERVENTION (PCI-S);  Surgeon: MuWellington HampshireMD;  [facility];  Service: Cardiovascular;;  . RENAL BIOPSY    . TOE AMPUTATION  2009   hammer toe    Family History:  Family History  Problem Relation Age of Onset  . Heart disease Mother   .  Diabetes Mother   . Prostate cancer Father   . Depression Other   . Diabetes Other   . Prostate cancer Other   . Colon polyps Neg Hx   . Colon cancer Neg Hx   . Rectal cancer Neg Hx   . Stomach cancer Neg Hx   . Esophageal cancer Neg Hx     Social History:  reports that he has never smoked. He has never used smokeless tobacco. He reports that he does not drink alcohol or use drugs.  Allergies:  Allergies  Allergen Reactions  . No Known Allergies     Medications: Medications reviewed in epic.  Please HPI for pertinent positives, otherwise complete 10 system ROS  negative.  Mallampati Score: MD Evaluation Airway: WNL Heart: WNL Abdomen: WNL Chest/ Lungs: WNL ASA  Classification: 3 Mallampati/Airway Score: Four  Physical Exam: BP (!) 148/67   Pulse 68   Temp 97.9 F (36.6 C) (Oral)   Resp 18   Ht 6' (1.829 m)   Wt 220 lb 8 oz (100 kg)   SpO2 98%   BMI 29.91 kg/m  Body mass index is 29.91 kg/m. General: pleasant, WD, WN white male who is laying in bed in NAD HEENT: head is normocephalic, atraumatic.  Sclera are noninjected.  PERRL.  Ears and nose without any masses or lesions.  Mouth is pink and moist Heart: regular, rate, and rhythm.  Normal s1,s2. No obvious murmurs, gallops, or rubs noted.  Palpable radial and pedal pulses bilaterally Lungs: CTAB, no wheezes, rhonchi, or rales noted.  Respiratory effort nonlabored Abd: soft, NT, ND, +BS, no masses, hernias, or organomegaly Psych: A&Ox3 with an appropriate affect.   Labs: Results for orders placed or performed during the hospital encounter of 08/20/16 (from the past 48 hour(s))  APTT     Status: None   Collection Time: 08/20/16 11:48 AM  Result Value Ref Range   aPTT 29 24 - 36 seconds  Basic metabolic panel     Status: Abnormal   Collection Time: 08/20/16 11:48 AM  Result Value Ref Range   Sodium 134 (L) 135 - 145 mmol/L   Potassium 5.0 3.5 - 5.1 mmol/L   Chloride 105 101 - 111 mmol/L   CO2 21 (L) 22 - 32 mmol/L   Glucose, Bld 103 (H) 65 - 99 mg/dL   BUN 57 (H) 6 - 20 mg/dL   Creatinine, Ser 2.07 (H) 0.61 - 1.24 mg/dL   Calcium 9.9 8.9 - 10.3 mg/dL   GFR calc non Af Amer 29 (L) >60 mL/min   GFR calc Af Amer 34 (L) >60 mL/min    Comment: (NOTE) The eGFR has been calculated using the CKD EPI equation. This calculation has not been validated in all clinical situations. eGFR's persistently <60 mL/min signify possible Chronic Kidney Disease.    Anion gap 8 5 - 15  CBC     Status: Abnormal   Collection Time: 08/20/16 11:48 AM  Result Value Ref Range   WBC 8.1 4.0 - 10.5  K/uL   RBC 3.06 (L) 4.22 - 5.81 MIL/uL   Hemoglobin 10.3 (L) 13.0 - 17.0 g/dL   HCT 31.1 (L) 39.0 - 52.0 %   MCV 101.6 (H) 78.0 - 100.0 fL   MCH 33.7 26.0 - 34.0 pg   MCHC 33.1 30.0 - 36.0 g/dL   RDW 17.9 (H) 11.5 - 15.5 %   Platelets 242 150 - 400 K/uL  Protime-INR     Status: None   Collection  Time: 08/20/16 11:48 AM  Result Value Ref Range   Prothrombin Time 12.9 11.4 - 15.2 seconds   INR 0.97     Imaging: No results found.  Assessment/Plan 1. B-cell lymphoma We will place a PAC today so he can start his chemo on Tuesday.  All labs and vitals reviewed Risks and Benefits discussed with the patient including, but not limited to bleeding, infection, pneumothorax, or fibrin sheath development and need for additional procedures. All of the patient's questions were answered, patient is agreeable to proceed. Consent signed and in chart.  Thank you for this interesting consult.  I greatly enjoyed meeting Luke Mcbride and look forward to participating in their care.  A copy of this report was sent to the requesting provider on this date.  Electronically Signed: Henreitta Cea 08/20/2016, 12:48 PM   I spent a total of  30 Minutes   in face to face in clinical consultation, greater than 50% of which was counseling/coordinating care for b-cell lymphoma

## 2016-08-24 ENCOUNTER — Ambulatory Visit (HOSPITAL_BASED_OUTPATIENT_CLINIC_OR_DEPARTMENT_OTHER): Payer: Medicare Other

## 2016-08-24 ENCOUNTER — Telehealth: Payer: Self-pay | Admitting: Hematology

## 2016-08-24 VITALS — BP 125/67 | HR 58 | Temp 97.7°F | Resp 16

## 2016-08-24 DIAGNOSIS — C8338 Diffuse large B-cell lymphoma, lymph nodes of multiple sites: Secondary | ICD-10-CM

## 2016-08-24 DIAGNOSIS — Z5112 Encounter for antineoplastic immunotherapy: Secondary | ICD-10-CM | POA: Diagnosis not present

## 2016-08-24 DIAGNOSIS — Z5111 Encounter for antineoplastic chemotherapy: Secondary | ICD-10-CM

## 2016-08-24 MED ORDER — DEXAMETHASONE SODIUM PHOSPHATE 10 MG/ML IJ SOLN
INTRAMUSCULAR | Status: AC
  Filled 2016-08-24: qty 1

## 2016-08-24 MED ORDER — SODIUM CHLORIDE 0.9 % IV SOLN
375.0000 mg/m2 | Freq: Once | INTRAVENOUS | Status: AC
  Administered 2016-08-24: 800 mg via INTRAVENOUS
  Filled 2016-08-24: qty 50

## 2016-08-24 MED ORDER — ACETAMINOPHEN 325 MG PO TABS
650.0000 mg | ORAL_TABLET | Freq: Once | ORAL | Status: AC
  Administered 2016-08-24: 650 mg via ORAL

## 2016-08-24 MED ORDER — HEPARIN SOD (PORK) LOCK FLUSH 100 UNIT/ML IV SOLN
500.0000 [IU] | Freq: Once | INTRAVENOUS | Status: AC | PRN
  Administered 2016-08-24: 500 [IU]
  Filled 2016-08-24: qty 5

## 2016-08-24 MED ORDER — PALONOSETRON HCL INJECTION 0.25 MG/5ML
0.2500 mg | Freq: Once | INTRAVENOUS | Status: AC
  Administered 2016-08-24: 0.25 mg via INTRAVENOUS

## 2016-08-24 MED ORDER — DIPHENHYDRAMINE HCL 25 MG PO CAPS
50.0000 mg | ORAL_CAPSULE | Freq: Once | ORAL | Status: AC
  Administered 2016-08-24: 50 mg via ORAL

## 2016-08-24 MED ORDER — DIPHENHYDRAMINE HCL 25 MG PO CAPS
ORAL_CAPSULE | ORAL | Status: AC
  Filled 2016-08-24: qty 2

## 2016-08-24 MED ORDER — VINCRISTINE SULFATE CHEMO INJECTION 1 MG/ML
2.0000 mg | Freq: Once | INTRAVENOUS | Status: AC
  Administered 2016-08-24: 2 mg via INTRAVENOUS
  Filled 2016-08-24: qty 2

## 2016-08-24 MED ORDER — PALONOSETRON HCL INJECTION 0.25 MG/5ML
INTRAVENOUS | Status: AC
Start: 2016-08-24 — End: 2016-08-24
  Filled 2016-08-24: qty 5

## 2016-08-24 MED ORDER — SODIUM CHLORIDE 0.9 % IV SOLN
Freq: Once | INTRAVENOUS | Status: AC
  Administered 2016-08-24: 10:00:00 via INTRAVENOUS

## 2016-08-24 MED ORDER — SODIUM CHLORIDE 0.9 % IV SOLN
50.0000 mg/m2 | Freq: Once | INTRAVENOUS | Status: AC
  Administered 2016-08-24: 110 mg via INTRAVENOUS
  Filled 2016-08-24: qty 5.5

## 2016-08-24 MED ORDER — CYCLOPHOSPHAMIDE CHEMO INJECTION 1 GM
750.0000 mg/m2 | Freq: Once | INTRAMUSCULAR | Status: AC
  Administered 2016-08-24: 1680 mg via INTRAVENOUS
  Filled 2016-08-24: qty 84

## 2016-08-24 MED ORDER — ACETAMINOPHEN 325 MG PO TABS
ORAL_TABLET | ORAL | Status: AC
  Filled 2016-08-24: qty 2

## 2016-08-24 MED ORDER — SODIUM CHLORIDE 0.9% FLUSH
10.0000 mL | INTRAVENOUS | Status: AC | PRN
  Administered 2016-08-24: 10 mL
  Filled 2016-08-24: qty 10

## 2016-08-24 MED ORDER — DEXAMETHASONE SODIUM PHOSPHATE 10 MG/ML IJ SOLN
10.0000 mg | Freq: Once | INTRAMUSCULAR | Status: AC
  Administered 2016-08-24: 10 mg via INTRAVENOUS

## 2016-08-24 NOTE — Patient Instructions (Signed)
[facility] Discharge Instructions for Patients Receiving Chemotherapy  Today you received the following chemotherapy agents:  Vincristine, Cytoxan, Etoposide and Rituxan  To help prevent nausea and vomiting after your treatment, we encourage you to take your nausea medication as ordered per MD.   If you develop nausea and vomiting that is not controlled by your nausea medication, call the clinic.   BELOW ARE SYMPTOMS THAT SHOULD BE REPORTED IMMEDIATELY:  *FEVER GREATER THAN 100.5 F  *CHILLS WITH OR WITHOUT FEVER  NAUSEA AND VOMITING THAT IS NOT CONTROLLED WITH YOUR NAUSEA MEDICATION  *UNUSUAL SHORTNESS OF BREATH  *UNUSUAL BRUISING OR BLEEDING  TENDERNESS IN MOUTH AND THROAT WITH OR WITHOUT PRESENCE OF ULCERS  *URINARY PROBLEMS  *BOWEL PROBLEMS  UNUSUAL RASH Items with * indicate a potential emergency and should be followed up as soon as possible.  Feel free to call the clinic you have any questions or concerns. The clinic phone number is [phone].  Please show the [place] at check-in to the Emergency Department and triage nurse.

## 2016-08-24 NOTE — Progress Notes (Unsigned)
Ok to treat with Scr 2.07 per dr Irene Limbo

## 2016-08-24 NOTE — Telephone Encounter (Signed)
Scheduled appt per 4/10 los. sch message - patient in the treatment area per Hollace Kinnier will let his nurse know to print a new schedule.

## 2016-08-25 ENCOUNTER — Encounter: Payer: Self-pay | Admitting: Pharmacist

## 2016-08-25 ENCOUNTER — Ambulatory Visit (HOSPITAL_BASED_OUTPATIENT_CLINIC_OR_DEPARTMENT_OTHER): Payer: Medicare Other

## 2016-08-25 VITALS — BP 132/66 | HR 65 | Temp 97.8°F | Resp 18

## 2016-08-25 DIAGNOSIS — C8338 Diffuse large B-cell lymphoma, lymph nodes of multiple sites: Secondary | ICD-10-CM | POA: Diagnosis not present

## 2016-08-25 DIAGNOSIS — Z5111 Encounter for antineoplastic chemotherapy: Secondary | ICD-10-CM

## 2016-08-25 MED ORDER — DEXAMETHASONE SODIUM PHOSPHATE 10 MG/ML IJ SOLN
10.0000 mg | Freq: Once | INTRAMUSCULAR | Status: AC
  Administered 2016-08-25: 10 mg via INTRAVENOUS

## 2016-08-25 MED ORDER — HEPARIN SOD (PORK) LOCK FLUSH 100 UNIT/ML IV SOLN
500.0000 [IU] | Freq: Once | INTRAVENOUS | Status: AC | PRN
  Administered 2016-08-25: 500 [IU]
  Filled 2016-08-25: qty 5

## 2016-08-25 MED ORDER — DEXAMETHASONE SODIUM PHOSPHATE 10 MG/ML IJ SOLN
INTRAMUSCULAR | Status: AC
  Filled 2016-08-25: qty 1

## 2016-08-25 MED ORDER — ETOPOSIDE CHEMO INJECTION 1 GM/50ML
50.0000 mg/m2 | Freq: Once | INTRAVENOUS | Status: AC
  Administered 2016-08-25: 110 mg via INTRAVENOUS
  Filled 2016-08-25: qty 5.5

## 2016-08-25 MED ORDER — PROCHLORPERAZINE MALEATE 10 MG PO TABS
10.0000 mg | ORAL_TABLET | Freq: Once | ORAL | Status: AC
Start: 2016-08-25 — End: 2016-08-25
  Administered 2016-08-25: 10 mg via ORAL

## 2016-08-25 MED ORDER — SODIUM CHLORIDE 0.9% FLUSH
10.0000 mL | INTRAVENOUS | Status: DC | PRN
  Administered 2016-08-25: 10 mL
  Filled 2016-08-25: qty 10

## 2016-08-25 MED ORDER — PROCHLORPERAZINE MALEATE 10 MG PO TABS
ORAL_TABLET | ORAL | Status: AC
  Filled 2016-08-25: qty 1

## 2016-08-25 MED ORDER — SODIUM CHLORIDE 0.9 % IV SOLN
Freq: Once | INTRAVENOUS | Status: AC
  Administered 2016-08-25: 08:00:00 via INTRAVENOUS

## 2016-08-25 NOTE — Patient Instructions (Signed)
[facility] Discharge Instructions for Patients Receiving Chemotherapy  Today you received the following chemotherapy agent: Etopside.  To help prevent nausea and vomiting after your treatment, we encourage you to take your nausea medication as prescribed.   If you develop nausea and vomiting that is not controlled by your nausea medication, call the clinic.   BELOW ARE SYMPTOMS THAT SHOULD BE REPORTED IMMEDIATELY:  *FEVER GREATER THAN 100.5 F  *CHILLS WITH OR WITHOUT FEVER  NAUSEA AND VOMITING THAT IS NOT CONTROLLED WITH YOUR NAUSEA MEDICATION  *UNUSUAL SHORTNESS OF BREATH  *UNUSUAL BRUISING OR BLEEDING  TENDERNESS IN MOUTH AND THROAT WITH OR WITHOUT PRESENCE OF ULCERS  *URINARY PROBLEMS  *BOWEL PROBLEMS  UNUSUAL RASH Items with * indicate a potential emergency and should be followed up as soon as possible.  Feel free to call the clinic you have any questions or concerns. The clinic phone number is [phone].  Please show the [place] at check-in to the Emergency Department and triage nurse.

## 2016-08-26 ENCOUNTER — Ambulatory Visit (HOSPITAL_BASED_OUTPATIENT_CLINIC_OR_DEPARTMENT_OTHER): Payer: Medicare Other

## 2016-08-26 VITALS — BP 153/71 | HR 66 | Temp 98.0°F | Resp 17

## 2016-08-26 DIAGNOSIS — Z5111 Encounter for antineoplastic chemotherapy: Secondary | ICD-10-CM | POA: Diagnosis not present

## 2016-08-26 DIAGNOSIS — Z5189 Encounter for other specified aftercare: Secondary | ICD-10-CM

## 2016-08-26 DIAGNOSIS — C8338 Diffuse large B-cell lymphoma, lymph nodes of multiple sites: Secondary | ICD-10-CM | POA: Diagnosis not present

## 2016-08-26 MED ORDER — PROCHLORPERAZINE MALEATE 10 MG PO TABS
10.0000 mg | ORAL_TABLET | Freq: Once | ORAL | Status: AC
  Administered 2016-08-26: 10 mg via ORAL

## 2016-08-26 MED ORDER — SODIUM CHLORIDE 0.9 % IV SOLN
50.0000 mg/m2 | Freq: Once | INTRAVENOUS | Status: AC
  Administered 2016-08-26: 110 mg via INTRAVENOUS
  Filled 2016-08-26: qty 5.5

## 2016-08-26 MED ORDER — HEPARIN SOD (PORK) LOCK FLUSH 100 UNIT/ML IV SOLN
500.0000 [IU] | Freq: Once | INTRAVENOUS | Status: AC | PRN
  Administered 2016-08-26: 500 [IU]
  Filled 2016-08-26: qty 5

## 2016-08-26 MED ORDER — DEXAMETHASONE SODIUM PHOSPHATE 10 MG/ML IJ SOLN
10.0000 mg | Freq: Once | INTRAMUSCULAR | Status: AC
  Administered 2016-08-26: 10 mg via INTRAVENOUS

## 2016-08-26 MED ORDER — SODIUM CHLORIDE 0.9% FLUSH
10.0000 mL | INTRAVENOUS | Status: DC | PRN
  Administered 2016-08-26: 10 mL
  Filled 2016-08-26: qty 10

## 2016-08-26 MED ORDER — PEGFILGRASTIM 6 MG/0.6ML ~~LOC~~ PSKT
6.0000 mg | PREFILLED_SYRINGE | Freq: Once | SUBCUTANEOUS | Status: AC
  Administered 2016-08-26: 6 mg via SUBCUTANEOUS
  Filled 2016-08-26: qty 0.6

## 2016-08-26 MED ORDER — PROCHLORPERAZINE MALEATE 10 MG PO TABS
ORAL_TABLET | ORAL | Status: AC
  Filled 2016-08-26: qty 1

## 2016-08-26 MED ORDER — SODIUM CHLORIDE 0.9 % IV SOLN
Freq: Once | INTRAVENOUS | Status: AC
  Administered 2016-08-26: 10:00:00 via INTRAVENOUS

## 2016-08-26 MED ORDER — DEXAMETHASONE SODIUM PHOSPHATE 10 MG/ML IJ SOLN
INTRAMUSCULAR | Status: AC
  Filled 2016-08-26: qty 1

## 2016-08-26 NOTE — Patient Instructions (Signed)
[facility] Discharge Instructions for Patients Receiving Chemotherapy  Today you received the following chemotherapy agent: Etopside.  To help prevent nausea and vomiting after your treatment, we encourage you to take your nausea medication as prescribed.   If you develop nausea and vomiting that is not controlled by your nausea medication, call the clinic.   BELOW ARE SYMPTOMS THAT SHOULD BE REPORTED IMMEDIATELY:  *FEVER GREATER THAN 100.5 F  *CHILLS WITH OR WITHOUT FEVER  NAUSEA AND VOMITING THAT IS NOT CONTROLLED WITH YOUR NAUSEA MEDICATION  *UNUSUAL SHORTNESS OF BREATH  *UNUSUAL BRUISING OR BLEEDING  TENDERNESS IN MOUTH AND THROAT WITH OR WITHOUT PRESENCE OF ULCERS  *URINARY PROBLEMS  *BOWEL PROBLEMS  UNUSUAL RASH Items with * indicate a potential emergency and should be followed up as soon as possible.  Feel free to call the clinic you have any questions or concerns. The clinic phone number is [phone].  Please show the [place] at check-in to the Emergency Department and triage nurse.

## 2016-08-31 ENCOUNTER — Encounter: Payer: Self-pay | Admitting: Hematology

## 2016-08-31 ENCOUNTER — Ambulatory Visit (HOSPITAL_BASED_OUTPATIENT_CLINIC_OR_DEPARTMENT_OTHER): Payer: Medicare Other | Admitting: Hematology

## 2016-08-31 ENCOUNTER — Ambulatory Visit (HOSPITAL_BASED_OUTPATIENT_CLINIC_OR_DEPARTMENT_OTHER): Payer: Medicare Other

## 2016-08-31 ENCOUNTER — Telehealth: Payer: Self-pay | Admitting: Hematology

## 2016-08-31 ENCOUNTER — Other Ambulatory Visit (HOSPITAL_BASED_OUTPATIENT_CLINIC_OR_DEPARTMENT_OTHER): Payer: Medicare Other

## 2016-08-31 ENCOUNTER — Other Ambulatory Visit: Payer: Medicare Other

## 2016-08-31 VITALS — BP 116/66 | HR 79 | Temp 97.8°F | Resp 18 | Ht 72.0 in | Wt 220.5 lb

## 2016-08-31 DIAGNOSIS — D696 Thrombocytopenia, unspecified: Secondary | ICD-10-CM | POA: Diagnosis not present

## 2016-08-31 DIAGNOSIS — F419 Anxiety disorder, unspecified: Secondary | ICD-10-CM | POA: Diagnosis not present

## 2016-08-31 DIAGNOSIS — D702 Other drug-induced agranulocytosis: Secondary | ICD-10-CM | POA: Diagnosis not present

## 2016-08-31 DIAGNOSIS — C8338 Diffuse large B-cell lymphoma, lymph nodes of multiple sites: Secondary | ICD-10-CM

## 2016-08-31 DIAGNOSIS — Z95828 Presence of other vascular implants and grafts: Secondary | ICD-10-CM

## 2016-08-31 LAB — COMPREHENSIVE METABOLIC PANEL
ALBUMIN: 2.9 g/dL — AB (ref 3.5–5.0)
ALK PHOS: 51 U/L (ref 40–150)
ALT: 40 U/L (ref 0–55)
ANION GAP: 11 meq/L (ref 3–11)
AST: 14 U/L (ref 5–34)
BILIRUBIN TOTAL: 1.13 mg/dL (ref 0.20–1.20)
BUN: 58.1 mg/dL — ABNORMAL HIGH (ref 7.0–26.0)
CALCIUM: 8.5 mg/dL (ref 8.4–10.4)
CHLORIDE: 109 meq/L (ref 98–109)
CO2: 22 mEq/L (ref 22–29)
CREATININE: 2 mg/dL — AB (ref 0.7–1.3)
EGFR: 31 mL/min/{1.73_m2} — ABNORMAL LOW (ref >90)
Glucose: 132 mg/dl (ref 70–140)
Potassium: 4.5 mEq/L (ref 3.5–5.1)
Sodium: 141 mEq/L (ref 136–145)
Total Protein: 5.6 g/dL — ABNORMAL LOW (ref 6.4–8.3)

## 2016-08-31 LAB — CBC WITH DIFFERENTIAL/PLATELET
BASO%: 0 % (ref 0.0–2.0)
Basophils Absolute: 0 10*3/uL (ref 0.0–0.1)
EOS ABS: 0 10*3/uL (ref 0.0–0.5)
EOS%: 2.3 % (ref 0.0–7.0)
HEMATOCRIT: 31.3 % — AB (ref 38.4–49.9)
HEMOGLOBIN: 10.5 g/dL — AB (ref 13.0–17.1)
LYMPH#: 0.1 10*3/uL — AB (ref 0.9–3.3)
LYMPH%: 14 % (ref 14.0–49.0)
MCH: 33.4 pg (ref 27.2–33.4)
MCHC: 33.5 g/dL (ref 32.0–36.0)
MCV: 99.7 fL — ABNORMAL HIGH (ref 79.3–98.0)
MONO#: 0 10*3/uL — AB (ref 0.1–0.9)
MONO%: 7 % (ref 0.0–14.0)
NEUT%: 76.7 % — AB (ref 39.0–75.0)
NEUTROS ABS: 0.3 10*3/uL — AB (ref 1.5–6.5)
PLATELETS: 62 10*3/uL — AB (ref 140–400)
RBC: 3.14 10*6/uL — ABNORMAL LOW (ref 4.20–5.82)
RDW: 19 % — ABNORMAL HIGH (ref 11.0–14.6)
WBC: 0.4 10*3/uL — CL (ref 4.0–10.3)
nRBC: 0 % (ref 0–0)

## 2016-08-31 MED ORDER — LORAZEPAM 0.5 MG PO TABS: 0.5000 mg | ORAL_TABLET | Freq: Three times a day (TID) | ORAL | 0 refills | Status: DC | PRN

## 2016-08-31 MED ORDER — SODIUM CHLORIDE 0.9% FLUSH
10.0000 mL | INTRAVENOUS | Status: DC | PRN
  Filled 2016-08-31: qty 10

## 2016-08-31 MED ORDER — HEPARIN SOD (PORK) LOCK FLUSH 100 UNIT/ML IV SOLN
500.0000 [IU] | INTRAVENOUS | Status: AC | PRN
Start: 2016-08-31 — End: 2016-08-31
  Administered 2016-08-31: 500 [IU]
  Filled 2016-08-31: qty 5

## 2016-08-31 NOTE — Telephone Encounter (Signed)
C2D1 scheduled for 09/15/16 (ok Per Dr Lucille Passy) , 05/03 & 05/04, per 08/31/16 los. Appointments scheduled per 08/31/16 los. Patient was given a copy of the AVS report and appointment schedule per 08/31/16 los.

## 2016-08-31 NOTE — Progress Notes (Signed)
HEMATOLOGY/ONCOLOGY CLINIC NOTE  Date of Service: .08/31/2016  Patient Care Team: Donato Heinz, MD as PCP - General (Nephrology) Elsie Stain, MD as Consulting Physician (Pulmonary Disease)  CHIEF COMPLAINTS/PURPOSE OF CONSULTATION:  Diffuse large B cell lymphoma of the small intestine  HISTORY OF PRESENTING ILLNESS:   Luke Mcbride is a wonderful 81 y.o. male who has been referred to [place] by Dr .Donetta Potts, MD and Mauricio Po MD for evaluation and management of newly diagnosed diffuse large B-cell lymphoma of the small bowel.  Patient has a history of multiple medical comorbidities as noted below including coronary artery disease status post PCI and CABG, chronic kidney disease stage III, Barrett's esophagus, hypertension, dyslipidemia, interstitial lung disease, GERD, vasculitis involving the lungs and kidneys on chronic Imuran therapy.  He was having upper abdominal discomfort for a few months with loss of about 15-20 pounds. He had a CT scan of the abdomen done by Dr. Havery Moros on 06/15/2016 which showed a 3.3 x 3.2 x 2.4 cm indeterminate lesion involving the left upper quadrant small bowel loops. No additional small bowel lesions noted. No ascites. No retroperitoneal mesenteric or pelvic lymphadenopathy noted.  Patient was seen by Dr. Serita Grammes and underwent a diagnostic laparoscopy with small bowel resection on 07/12/2016. Pathology showed that the lesion was consistent with diffuse large B-cell lymphoma 2 cm in length and 4 cm in width centrally ulcerated mass involving 100% of the bowel circumference. The tumor was about 1 cm thick invading through the entire wall and into the underlying mesenteric fat. The tumor also abuts and focally involve the serosa. No extramural satellite lymph nodes noted. Did not seem to involve the peri-intestinal lymph nodes. Tumor was noted to have a germinal center phenotype.  Patient is healing well from surgery. Continues  to be on Imuran 100 mg by mouth daily. Has not reported any other peripheral enlarged lymph nodes. He is starting to eat better.   INTERVAL HISTORY  Patient is here for follow-up for toxicity check after his first cycle of R CEOP. He notes some fatigue grade 1 and some body aches likely from Neulasta. No fevers no chills. No uncontrolled nausea or vomiting. Notes feeling somewhat edgy and anxious on the prednisone. He was taking at as 20 mg 3 times a day as opposed to 60 mg once daily with breakfast as ordered. He was counseled to take the medication as ordered. Labs today show significant neutropenia with an [place] of 300. WBC count of 400. Hemoglobin is slightly low at 10.5 with from a cytopenia of 62k no fevers no issues with bleeding. Has received his Neulasta shot on 08/26/2016 and anticipated that his [facility], should bounce back soon. Counseled on neutropenic precautions and need for immediate attention if he has any febrile symptoms. Repeat labs in a week to trend his blood counts and will return back to clinic in 2 weeks labs again prior to his second cycle of treatment. No other acute new symptoms. Given prescription for Ativan when necessary for anxiety.    MEDICAL HISTORY:  Past Medical History:  Diagnosis Date  . ALLERGIC RHINITIS   . Anemia    hx  . Barrett's esophagus   . BOOP (bronchiolitis obliterans with organizing pneumonia) ([place])    a. s/p R VATS 2008.  [name] CAD (coronary artery disease)    a. 05/2000: NSTEMI/CABG x 6: LIMA->LAD, VG->D1, VG->OM1->2, VG->PDA->RPL;  b. 07/2007 MV: high lat infarct, no ischemia, EF 47%;  c.  Cath/PCI: LM nl, LAD20p, 61m, D1 nl, D2 60-70ost, D3 nl, LCX 70ost, 156m, OM1/OM2 min irregs, RCA 30 diff, PDA 99, LIMA->LAD atretic, VG->D1 100, VG->OM1->2 100, VG->PDA->RPL 90p (4.0x23 Vision BMS);  c. 07/2012 Echo: EF 55%, gr1 DD.  [name] Cataract   . CKD (chronic kidney disease), stage III    a. renal bx 2008: GLN with vasculitis  . Diffuse large B cell lymphoma  ([place]) 08/07/2016  . GERD (gastroesophageal reflux disease)   . Glaucoma    a. Cannot see out of L eye.  . Hematuria    Microscopic  . Hyperglycemia    Patient reported while on prednisone, had to take insulin  . Hyperlipemia   . Hypertension   . Hypothyroidism   . ILD (interstitial lung disease) ([place])   . Iritis   . Local infection of skin and subcutaneous tissue   . Membranoproliferative nephritis   . Myocardial infarction ([place]) 2002  . Neutropenia, drug-induced ([place])   . Recurrent boils   . Residual foreign body in soft tissue   . Vasculitis ([place])    a.  pauciimmune vasculitis with renal involvement and hx of transient hemoptysis in the past with associated BOOP, 2008 (renal bx 2008: GLN with vasculitis). b. History of treatment with 2 cycles of Cytoxan and pheresis. H/o hemoptysis and pulm hemorrhage with 2nd cycle of cytoxan.    SURGICAL HISTORY: Past Surgical History:  Procedure Laterality Date  . BOWEL RESECTION N/A 07/12/2016   Procedure: SMALL BOWEL RESECTION;  Surgeon: Rolm Bookbinder, MD;  Location: [facility];  Service: General;  Laterality: N/A;  . CATARACT EXTRACTION Bilateral   . CORONARY ARTERY BYPASS GRAFT  2002  . IR FLUORO GUIDE PORT INSERTION RIGHT  08/20/2016  . IR US GUIDE VASC ACCESS RIGHT  08/20/2016  . LAPAROSCOPY N/A 07/12/2016   Procedure: LAPAROSCOPY DIAGNOSTIC;  Surgeon: Rolm Bookbinder, MD;  Location: [place];  Service: General;  Laterality: N/A;  . LEFT HEART CATHETERIZATION WITH CORONARY/GRAFT ANGIOGRAM N/A 07/26/2012   Procedure: LEFT HEART CATHETERIZATION WITH Beatrix Fetters;  Surgeon: Wellington Hampshire, MD;  Location: [place] CATH LAB;  Service: Cardiovascular;  Laterality: N/A;  . LUNG BIOPSY    . PERCUTANEOUS CORONARY STENT INTERVENTION (PCI-S)  07/26/2012   Procedure: PERCUTANEOUS CORONARY STENT INTERVENTION (PCI-S);  Surgeon: Wellington Hampshire, MD;  Location: [facility] CATH LAB;  Service: Cardiovascular;;  . RENAL BIOPSY    . TOE AMPUTATION  2009    hammer toe    SOCIAL HISTORY: Social History   Social History  . Marital status: Married    Spouse name: N/A  . Number of children: N/A  . Years of education: N/A   Occupational History  . [place] History Main Topics  . Smoking status: Never Smoker  . Smokeless tobacco: Never Used  . Alcohol use No  . Drug use: No  . Sexual activity: Not on file   Other Topics Concern  . Not on file   Social History Narrative   Regular exercise - yes      [place] [facility] (07/12/16):   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  no pets currently. No bird or mold exposure.    FAMILY HISTORY: Family History  Problem Relation Age of Onset  . Heart disease Mother   . Diabetes Mother   . Prostate cancer Father   . Depression Other   . Diabetes Other   . Prostate cancer Other   . Colon polyps Neg  Hx   . Colon cancer Neg Hx   . Rectal cancer Neg Hx   . Stomach cancer Neg Hx   . Esophageal cancer Neg Hx     ALLERGIES:  is allergic to no known allergies.  MEDICATIONS:  Current Outpatient Prescriptions  Medication Sig Dispense Refill  . acetaminophen (TYLENOL) 500 MG tablet Take 500-1,000 mg by mouth daily as needed for moderate pain or headache.    . allopurinol (ZYLOPRIM) 100 MG tablet Take 1 tablet (100 mg total) by mouth 2 (two) times daily. Start taking after stopping Imuran 60 tablet 0  . aspirin EC 81 MG EC tablet Take 1 tablet (81 mg total) by mouth daily. 30 tablet   . azaTHIOprine (IMURAN) 50 MG tablet TAKE 2 TABLETS(100MG ) BY MOUTH IN THE AM AND 1 TABLET(50MG ) BY MOUTH IN THE PM    . clopidogrel (PLAVIX) 75 MG tablet Take 1 tablet (75 mg total) by mouth daily. 90 tablet 2  . dexamethasone (DECADRON) 4 MG tablet Take 1 tablet (4 mg total) by mouth 2 (two) times daily with a meal. 20 tablet 0  . finasteride (PROSCAR) 5 MG tablet Take 5 mg by mouth at bedtime.     [name] levothyroxine (SYNTHROID, LEVOTHROID) 100 MCG tablet Take 100 mcg by mouth  daily.      [name] lidocaine-prilocaine (EMLA) cream Apply to affected area once 30 g 3  . metoprolol tartrate (LOPRESSOR) 25 MG tablet Take 0.5 tablets (12.5 mg total) by mouth 2 (two) times daily. 90 tablet 2  . Multiple Vitamin (MULTIVITAMIN) tablet Take 1 tablet by mouth daily.      [name] NITROSTAT 0.4 MG SL tablet PLACE 1 TABLET (0.4 MG TOTAL) UNDER THE TONGUE EVERY 5 (FIVE) MINUTES AS NEEDED. MAY REPEAT X3 (Patient taking differently: Place 0.4 mg under the tongue every 5 (five) minutes as needed for chest pain. May repeat x3) 25 tablet 2  . ondansetron (ZOFRAN) 8 MG tablet Take 1 tablet (8 mg total) by mouth 2 (two) times daily as needed for refractory nausea / vomiting. Start on day 3 after cyclophosphamide. 30 tablet 1  . pantoprazole (PROTONIX) 40 MG tablet TAKE 1 TABLET (40 MG TOTAL) BY MOUTH DAILY. 90 tablet 2  . predniSONE (DELTASONE) 20 MG tablet Take 3 tablets (60 mg total) by mouth daily. Take on days 1-5 of chemotherapy. 15 tablet 6  . prochlorperazine (COMPAZINE) 10 MG tablet Take 1 tablet (10 mg total) by mouth every 6 (six) hours as needed (Nausea or vomiting). 30 tablet 6  . rosuvastatin (CRESTOR) 10 MG tablet Take 10 mg by mouth at bedtime.     . Tamsulosin HCl (FLOMAX) 0.4 MG CAPS Take 0.4 mg by mouth at bedtime.     [name] telmisartan (MICARDIS) 40 MG tablet Take 20 mg by mouth daily.      No current facility-administered medications for this visit.    Facility-Administered Medications Ordered in Other Visits  Medication Dose Route Frequency Provider Last Rate Last Dose  . sodium chloride flush (NS) 0.9 % injection 10 mL  10 mL Intracatheter PRN Brunetta Genera, MD   10 mL at 08/24/16 1653    REVIEW OF SYSTEMS:    10 Point review of Systems was done is negative except as noted above.  PHYSICAL EXAMINATION: ECOG PERFORMANCE STATUS: 2 - Symptomatic, <50% confined to bed  . Vitals:   08/31/16 0948  BP: 116/66  Pulse: 79  Resp: 18  Temp: 97.8 F (36.6 C)   Filed Weights  08/31/16 0948  Weight: 220 lb 8 oz (100 kg)   .Body mass index is 29.91 kg/m.  GENERAL:alert, in no acute distress and comfortable SKIN: no acute rashes, no significant lesions EYES: conjunctiva are pink and non-injected, sclera anicteric OROPHARYNX: MMM, no exudates, no oropharyngeal erythema or ulceration NECK: supple, no JVD LYMPH:  no palpable lymphadenopathy in the cervical, axillary or inguinal regions LUNGS: clear to auscultation b/l with normal respiratory effort HEART: regular rate & rhythm ABDOMEN:  normoactive bowel sounds , non tender, not distended. Healed laparoscopic surgical scars Extremity: no pedal edema PSYCH: alert & oriented x 3 with fluent speech NEURO: no focal motor/sensory deficits  LABORATORY DATA:  I have reviewed the data as listed  . CBC Latest Ref Rng & Units 08/31/2016 08/20/2016 08/11/2016  WBC 4.0 - 10.3 10e3/uL 0.4(LL) 8.1 5.9  Hemoglobin 13.0 - 17.1 g/dL 10.5(L) 10.3(L) 11.1(L)  Hematocrit 38.4 - 49.9 % 31.3(L) 31.1(L) 34.7(L)  Platelets 140 - 400 10e3/uL 62(L) 242 169  ANC 300  . CMP Latest Ref Rng & Units 08/31/2016 08/20/2016 08/11/2016  Glucose 70 - 140 mg/dl 132 103(H) 97  BUN 7.0 - 26.0 mg/dL 58.1(H) 57(H) 39.7(H)  Creatinine 0.7 - 1.3 mg/dL 2.0(H) 2.07(H) 2.2(H)  Sodium 136 - 145 mEq/L 141 134(L) 138  Potassium 3.5 - 5.1 mEq/L 4.5 5.0 4.6  Chloride 101 - 111 mmol/L - 105 -  CO2 22 - 29 mEq/L 22 21(L) 23  Calcium 8.4 - 10.4 mg/dL 8.5 9.9 11.2(H)  Total Protein 6.4 - 8.3 g/dL 5.6(L) - 7.2  Total Bilirubin 0.20 - 1.20 mg/dL 1.13 - 0.53  Alkaline Phos 40 - 150 U/L 51 - 80  AST 5 - 34 U/L 14 - 28  ALT 0 - 55 U/L 40 - 27   . Lab Results  Component Value Date   LDH 302 (H) 08/11/2016   Component     Latest Ref Rng & Units 08/11/2016  Uric Acid, Serum     2.6 - 7.4 mg/dl 8.4 (H)  Hepatitis B Surface Ag     Negative Negative  Hep B Core Ab, Tot     Negative Negative  Hep C Virus Ab     0.0 - 0.9 s/co ratio <0.1    Myocardial  perfusion scan 06/30/2016 Study Highlights     The left ventricular ejection fraction is moderately decreased (30-44%).  Nuclear stress EF: 40%.  There was no ST segment deviation noted during stress.  No T wave inversion was noted during stress.  Defect 1: There is a large defect of severe severity present in the basal inferolateral, basal anterolateral, mid inferolateral and mid anterolateral location.  Findings consistent with prior myocardial infarction.  This is an intermediate risk study.   Intermediate risk stress nuclear study with a large scar in the left circumflex coronary artery distribution moderate reduction in left ventricular systolic function.   RADIOGRAPHIC STUDIES: I have personally reviewed the radiological images as listed and agreed with the findings in the report. Nm Pet Image Initial (pi) Skull Base To Thigh  Result Date: 08/06/2016 CLINICAL DATA:  Initial treatment strategy for diffuse large B-cell non-Hodgkin's lymphoma. EXAM: NUCLEAR MEDICINE PET SKULL BASE TO THIGH TECHNIQUE: 10.8 mCi F-18 FDG was injected intravenously. Full-ring PET imaging was performed from the skull base to thigh after the radiotracer. CT data was obtained and used for attenuation correction and anatomic localization. FASTING BLOOD GLUCOSE:  Value: 120 mg/dl COMPARISON:  CT abdomen from 06/15/2016 FINDINGS: NECK No hypermetabolic lymph  nodes in the neck. CHEST Several hypermetabolic right axillary lymph nodes, including a 0.9 cm in short axis node on image 62/4 with maximum SUV 29.4. In the pericardial adipose tissue there several hypermetabolic lymph nodes including a 1.1 cm node on image 94/4 with maximum SUV 14.7. Focal hypermetabolic activity along the left ventricular myocardium is present in the anterolateral wall, probably incidental, but somewhat notably focally prominent with maximum SUV 9.8. Coronary, aortic arch, and branch vessel atherosclerotic vascular disease. Prior CABG. There  is evidence of old granulomatous disease including a calcified granuloma posteriorly in the right upper lobe on image 29/8. ABDOMEN/PELVIS Innumerable hypermetabolic lesions are present throughout the liver including tracking along the liver capsule. One larger lesion in segment 4 has a maximum SUV of 28.9, and a long-axis diameter of the hypermetabolic activity at 4.9 cm. Small foci of hypermetabolic activity are present along the margin of the spleen. Hypermetabolic porta hepatis and celiac chain lymph nodes are present and there scattered hypermetabolic nodules along the upper omentum. A proximal celiac chain lymph node with short axis diameter of 1.1 cm on image 112 of series 4 has a maximum SUV of 22.5 an omental lesion measuring 8 mm in diameter anterior to the transverse colon on image 112/4 has a maximum SUV of 6.8. There are hypodense renal lesions. I am uncertain whether the focal hypermetabolic activity along the left renal collecting system is due to the adjacent hypodense lesion or collecting system activity, I favor the latter. Tiny hypermetabolic retroperitoneal lymph nodes are present. A hypermetabolic omental tumor deposit incorporated into a small Spigelian hernia on the left measures 0.9 by 1.0 cm on image 176/4 and has a maximum SUV of 15.9. SKELETON Abnormal focal hypermetabolic activity in the anterior wall of the left acetabulum has maximum SUV of 29.4, compatible with malignancy. This is relatively inconspicuous on the CT data. A similar lesion in the left iliac bone has a maximum SUV of 22.8. Anterolisthesis at L5-S1 with suspected pars defects at L5. Small hernias of adipose tissue are present along the midline laparotomy scar. IMPRESSION: 1. In addition to hypermetabolic nodal involvement of the right axilla, pericardial space, porta hepatis/mesenteric root, and retroperitoneum, there is extensive involvement of the liver as well as peritoneal involvement along the liver surface, splenic  surface, and upper omentum. 2. Hypermetabolic bony lesions in the left pelvis compatible with malignant involvement. 3. There is a focus of hypermetabolic activity along the anterolateral wall of the left ventricle. Normally such findings turn out to simply be hypermetabolic myocardium. Strictly speaking given how focal this appears I cannot completely exclude the possibility of tumor involvement within or along the myocardium. Electronically Signed   By: Van Clines M.D.   On: 08/06/2016 12:48   Ir US Guide Vasc Access Right  Result Date: 08/20/2016 INDICATION: 81 year old male with B-cell lymphoma EXAM: IMPLANTED PORT A CATH PLACEMENT WITH ULTRASOUND AND FLUOROSCOPIC GUIDANCE MEDICATIONS: 2.0 g Ancef; The antibiotic was administered within an appropriate time interval prior to skin puncture. ANESTHESIA/SEDATION: Moderate (conscious) sedation was employed during this procedure. A total of Versed 2.0 mg and Fentanyl 50 mcg was administered intravenously. Moderate Sedation Time: 18 minutes. The patient's level of consciousness and vital signs were monitored continuously by radiology nursing throughout the procedure under my direct supervision. FLUOROSCOPY TIME:  Zero minutes, 6 seconds (1.3 mGy) COMPLICATIONS: None PROCEDURE: The procedure, risks, benefits, and alternatives were explained to the patient. Questions regarding the procedure were encouraged and answered. The patient understands and consents  to the procedure. Ultrasound survey was performed with images stored and sent to PACs. The right neck and chest was prepped with chlorhexidine, and draped in the usual sterile fashion using maximum barrier technique (cap and mask, sterile gown, sterile gloves, large sterile sheet, hand hygiene and cutaneous antiseptic). Antibiotic prophylaxis was provided with 2.0g Ancef administered IV one hour prior to skin incision. Local anesthesia was attained by infiltration with 1% lidocaine without epinephrine.  Ultrasound demonstrated patency of the right internal jugular vein, and this was documented with an image. Under real-time ultrasound guidance, this vein was accessed with a 21 gauge micropuncture needle and image documentation was performed. A small dermatotomy was made at the access site with an 11 scalpel. A 0.018" wire was advanced into the SVC and used to estimate the length of the internal catheter. The access needle exchanged for a 5F micropuncture vascular sheath. The 0.018" wire was then removed and a 0.035" wire advanced into the IVC. An appropriate location for the subcutaneous reservoir was selected below the clavicle and an incision was made through the skin and underlying soft tissues. The subcutaneous tissues were then dissected using a combination of blunt and sharp surgical technique and a pocket was formed. A single lumen power injectable portacatheter was then tunneled through the subcutaneous tissues from the pocket to the dermatotomy and the port reservoir placed within the subcutaneous pocket. The venous access site was then serially dilated and a peel away vascular sheath placed over the wire. The wire was removed and the port catheter advanced into position under fluoroscopic guidance. The catheter tip is positioned in the cavoatrial junction. This was documented with a spot image. The portacatheter was then tested and found to flush and aspirate well. The port was flushed with saline followed by 100 units/mL heparinized saline. The pocket was then closed in two layers using first subdermal inverted interrupted absorbable sutures followed by a running subcuticular suture. The epidermis was then sealed with Dermabond. The dermatotomy at the venous access site was also seal with Dermabond. Patient tolerated the procedure well and remained hemodynamically stable throughout. No complications encountered and no significant blood loss encountered IMPRESSION: Status post right IJ port catheter  placement. Catheter ready for use. Signed, Dulcy Fanny. Earleen Newport, DO [facility] Electronically Signed   By: Corrie Mckusick D.O.   On: 08/20/2016 13:54   Ir Fluoro Guide Port Insertion Right  Result Date: 08/20/2016 INDICATION: 81 year old male with B-cell lymphoma EXAM: IMPLANTED PORT A CATH PLACEMENT WITH ULTRASOUND AND FLUOROSCOPIC GUIDANCE MEDICATIONS: 2.0 g Ancef; The antibiotic was administered within an appropriate time interval prior to skin puncture. ANESTHESIA/SEDATION: Moderate (conscious) sedation was employed during this procedure. A total of Versed 2.0 mg and Fentanyl 50 mcg was administered intravenously. Moderate Sedation Time: 18 minutes. The patient's level of consciousness and vital signs were monitored continuously by radiology nursing throughout the procedure under my direct supervision. FLUOROSCOPY TIME:  Zero minutes, 6 seconds (1.3 mGy) COMPLICATIONS: None PROCEDURE: The procedure, risks, benefits, and alternatives were explained to the patient. Questions regarding the procedure were encouraged and answered. The patient understands and consents to the procedure. Ultrasound survey was performed with images stored and sent to PACs. The right neck and chest was prepped with chlorhexidine, and draped in the usual sterile fashion using maximum barrier technique (cap and mask, sterile gown, sterile gloves, large sterile sheet, hand hygiene and cutaneous antiseptic). Antibiotic prophylaxis was provided with 2.0g Ancef administered IV one hour prior to  skin incision. Local anesthesia was attained by infiltration with 1% lidocaine without epinephrine. Ultrasound demonstrated patency of the right internal jugular vein, and this was documented with an image. Under real-time ultrasound guidance, this vein was accessed with a 21 gauge micropuncture needle and image documentation was performed. A small dermatotomy was made at the access site with an 11  scalpel. A 0.018" wire was advanced into the SVC and used to estimate the length of the internal catheter. The access needle exchanged for a 7F micropuncture vascular sheath. The 0.018" wire was then removed and a 0.035" wire advanced into the IVC. An appropriate location for the subcutaneous reservoir was selected below the clavicle and an incision was made through the skin and underlying soft tissues. The subcutaneous tissues were then dissected using a combination of blunt and sharp surgical technique and a pocket was formed. A single lumen power injectable portacatheter was then tunneled through the subcutaneous tissues from the pocket to the dermatotomy and the port reservoir placed within the subcutaneous pocket. The venous access site was then serially dilated and a peel away vascular sheath placed over the wire. The wire was removed and the port catheter advanced into position under fluoroscopic guidance. The catheter tip is positioned in the cavoatrial junction. This was documented with a spot image. The portacatheter was then tested and found to flush and aspirate well. The port was flushed with saline followed by 100 units/mL heparinized saline. The pocket was then closed in two layers using first subdermal inverted interrupted absorbable sutures followed by a running subcuticular suture. The epidermis was then sealed with Dermabond. The dermatotomy at the venous access site was also seal with Dermabond. Patient tolerated the procedure well and remained hemodynamically stable throughout. No complications encountered and no significant blood loss encountered IMPRESSION: Status post right IJ port catheter placement. Catheter ready for use. Signed, Dulcy Fanny. Earleen Newport, DO [facility] Electronically Signed   By: Corrie Mckusick D.O.   On: 08/20/2016 13:54   CT abdomen/pelvis (06/15/2016) IMPRESSION: 1. Small bowel lesion is identified within the left  upper quadrant the abdomen. Finding is worrisome for non obstructing small bowel neoplasm. 2. No evidence for metastatic adenopathy or distant metastatic disease. 3. Aortic atherosclerosis and infrarenal abdominal aortic aneurysm. Recommend followup by ultrasound in 3 years. This recommendation follows ACR consensus guidelines: White Paper of the ACR Incidental Findings Committee II on Vascular Findings. [facility] Radiol 2013; 10:789-794   Electronically Signed   By: Kerby Moors M.D.   On: 06/15/2016 15:14  ASSESSMENT & PLAN:   81 yo male with multiple medical co-morbidities including cardiac co-morbidities, IPF, vascular on chronic immunosupression with   1) Newly diagnosed Diffuse large B-cell lymphoma involving the small intestine (germinal cell phenotype) with complete thickness involvement of bowel and abutting serosa. Patient on labs today has elevated LDH of 305 and hypercalcemia concerning for possible presence of more extensive DLBCL  PET/CT scan shows extensive involvement with DLBCL, hypermetabolic nodal involvement of the right axilla, pericardial space, porta hepatis/mesenteric root, and retroperitoneum, there is extensive involvement of the liver as well as peritoneal involvement along the liver surface, splenic surface, and upper omentum. 2. Hypermetabolic bony lesions in the left pelvis compatible with malignant involvement. 3. There is a focus of hypermetabolic activity along the anterolateral wall of the left ventricle.   2) chronic kidney disease stage III baseline creatinine about 2 3) coronary disease status post PCI and CABG - had a myocardial  perfusion imaging scan on 06/30/2016 that showed a moderately decreased ejection fraction of 30-44%- this would likely include anthracycline use. 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 6) thrombocytopenia due to chemotherapy   Plan -Patient is here for his day 7 toxicity check and appears to have neutropenia thrombocytopenia at his nadir currently . -counseled on neutropenic precautions and need for immediate attention if he develops a fever more than 100.4 or any signs of infection  -Repeat labs in 1 week to monitor for recovery of counts. -Follow-up in 2 weeks with Dr. Irene Limbo with repeat labs prior to his second cycle of R_CEOP. -Might need to reduce the dose of etoposide plus or minus cyclophosphamide based on count recovery. -We'll plan to discontinue allopurinol if no evidence of tumor lysis on subsequent labs. This might help his blood counts as well. -Counseled patient to take the prednisone 60 mg by mouth daily as ordered and not split the doses and 3 that might cause more issues with insomnia and anxiety . -Given a prescription for when necessary Ativan for anxiety .  Rpt labs in 1 week RTC with Dr Irene Limbo with labs in 2 weeks C2D1 of chemotherapy on 09/14/2016  All of the patients questions were answered with apparent satisfaction. The patient knows to call the clinic with any problems, questions or concerns.  I spent 20 minutes counseling the patient face to face. The total time spent in the appointment was 25 minutes and more than 50% was on counseling and direct patient cares.    Sullivan Lone MD [place] AAHIVMS [facility] Hematology/Oncology Physician [facility]  (Office):       [phone] (Work cell):  [phone] (Fax):           [phone]

## 2016-09-01 ENCOUNTER — Emergency Department (HOSPITAL_COMMUNITY): Payer: Medicare Other

## 2016-09-01 ENCOUNTER — Inpatient Hospital Stay (HOSPITAL_COMMUNITY)
Admission: EM | Admit: 2016-09-01 | Discharge: 2016-09-05 | DRG: 871 | Disposition: A | Discharge status: Home / Self Care | Payer: Medicare Other | Attending: Internal Medicine | Admitting: Internal Medicine

## 2016-09-01 ENCOUNTER — Encounter (HOSPITAL_COMMUNITY): Payer: Self-pay | Admitting: Emergency Medicine

## 2016-09-01 DIAGNOSIS — D701 Agranulocytosis secondary to cancer chemotherapy: Secondary | ICD-10-CM | POA: Diagnosis not present

## 2016-09-01 DIAGNOSIS — C8339 Diffuse large B-cell lymphoma, extranodal and solid organ sites: Secondary | ICD-10-CM | POA: Diagnosis present

## 2016-09-01 DIAGNOSIS — T451X5A Adverse effect of antineoplastic and immunosuppressive drugs, initial encounter: Secondary | ICD-10-CM | POA: Diagnosis not present

## 2016-09-01 DIAGNOSIS — A4151 Sepsis due to Escherichia coli [E. coli]: Secondary | ICD-10-CM | POA: Diagnosis not present

## 2016-09-01 DIAGNOSIS — Z951 Presence of aortocoronary bypass graft: Secondary | ICD-10-CM

## 2016-09-01 DIAGNOSIS — Y95 Nosocomial condition: Secondary | ICD-10-CM | POA: Diagnosis present

## 2016-09-01 DIAGNOSIS — K2270 Barrett's esophagus without dysplasia: Secondary | ICD-10-CM | POA: Diagnosis present

## 2016-09-01 DIAGNOSIS — Z833 Family history of diabetes mellitus: Secondary | ICD-10-CM

## 2016-09-01 DIAGNOSIS — Z7902 Long term (current) use of antithrombotics/antiplatelets: Secondary | ICD-10-CM

## 2016-09-01 DIAGNOSIS — I517 Cardiomegaly: Secondary | ICD-10-CM | POA: Diagnosis not present

## 2016-09-01 DIAGNOSIS — J189 Pneumonia, unspecified organism: Secondary | ICD-10-CM | POA: Diagnosis not present

## 2016-09-01 DIAGNOSIS — R531 Weakness: Secondary | ICD-10-CM | POA: Diagnosis not present

## 2016-09-01 DIAGNOSIS — E7800 Pure hypercholesterolemia, unspecified: Secondary | ICD-10-CM | POA: Diagnosis present

## 2016-09-01 DIAGNOSIS — Z7982 Long term (current) use of aspirin: Secondary | ICD-10-CM

## 2016-09-01 DIAGNOSIS — Z7952 Long term (current) use of systemic steroids: Secondary | ICD-10-CM

## 2016-09-01 DIAGNOSIS — R509 Fever, unspecified: Secondary | ICD-10-CM | POA: Diagnosis not present

## 2016-09-01 DIAGNOSIS — D6959 Other secondary thrombocytopenia: Secondary | ICD-10-CM | POA: Diagnosis present

## 2016-09-01 DIAGNOSIS — Z79899 Other long term (current) drug therapy: Secondary | ICD-10-CM

## 2016-09-01 DIAGNOSIS — N184 Chronic kidney disease, stage 4 (severe): Secondary | ICD-10-CM | POA: Diagnosis present

## 2016-09-01 DIAGNOSIS — Z9861 Coronary angioplasty status: Secondary | ICD-10-CM

## 2016-09-01 DIAGNOSIS — A419 Sepsis, unspecified organism: Secondary | ICD-10-CM | POA: Diagnosis present

## 2016-09-01 DIAGNOSIS — L259 Unspecified contact dermatitis, unspecified cause: Secondary | ICD-10-CM | POA: Diagnosis not present

## 2016-09-01 DIAGNOSIS — D61810 Antineoplastic chemotherapy induced pancytopenia: Secondary | ICD-10-CM | POA: Diagnosis not present

## 2016-09-01 DIAGNOSIS — N390 Urinary tract infection, site not specified: Secondary | ICD-10-CM | POA: Diagnosis not present

## 2016-09-01 DIAGNOSIS — H409 Unspecified glaucoma: Secondary | ICD-10-CM | POA: Diagnosis present

## 2016-09-01 DIAGNOSIS — R32 Unspecified urinary incontinence: Secondary | ICD-10-CM | POA: Diagnosis present

## 2016-09-01 DIAGNOSIS — E039 Hypothyroidism, unspecified: Secondary | ICD-10-CM | POA: Diagnosis present

## 2016-09-01 DIAGNOSIS — N183 Chronic kidney disease, stage 3 unspecified: Secondary | ICD-10-CM | POA: Diagnosis present

## 2016-09-01 DIAGNOSIS — Z8249 Family history of ischemic heart disease and other diseases of the circulatory system: Secondary | ICD-10-CM

## 2016-09-01 DIAGNOSIS — N400 Enlarged prostate without lower urinary tract symptoms: Secondary | ICD-10-CM | POA: Diagnosis present

## 2016-09-01 DIAGNOSIS — E785 Hyperlipidemia, unspecified: Secondary | ICD-10-CM | POA: Diagnosis present

## 2016-09-01 DIAGNOSIS — I129 Hypertensive chronic kidney disease with stage 1 through stage 4 chronic kidney disease, or unspecified chronic kidney disease: Secondary | ICD-10-CM | POA: Diagnosis present

## 2016-09-01 DIAGNOSIS — R338 Other retention of urine: Secondary | ICD-10-CM | POA: Diagnosis present

## 2016-09-01 DIAGNOSIS — D702 Other drug-induced agranulocytosis: Secondary | ICD-10-CM

## 2016-09-01 DIAGNOSIS — K5900 Constipation, unspecified: Secondary | ICD-10-CM | POA: Diagnosis present

## 2016-09-01 DIAGNOSIS — N401 Enlarged prostate with lower urinary tract symptoms: Secondary | ICD-10-CM | POA: Diagnosis present

## 2016-09-01 DIAGNOSIS — R5081 Fever presenting with conditions classified elsewhere: Secondary | ICD-10-CM | POA: Diagnosis present

## 2016-09-01 DIAGNOSIS — I10 Essential (primary) hypertension: Secondary | ICD-10-CM | POA: Diagnosis present

## 2016-09-01 DIAGNOSIS — R404 Transient alteration of awareness: Secondary | ICD-10-CM | POA: Diagnosis not present

## 2016-09-01 DIAGNOSIS — D709 Neutropenia, unspecified: Secondary | ICD-10-CM

## 2016-09-01 DIAGNOSIS — K219 Gastro-esophageal reflux disease without esophagitis: Secondary | ICD-10-CM | POA: Diagnosis present

## 2016-09-01 DIAGNOSIS — D61818 Other pancytopenia: Secondary | ICD-10-CM | POA: Diagnosis present

## 2016-09-01 DIAGNOSIS — I252 Old myocardial infarction: Secondary | ICD-10-CM

## 2016-09-01 DIAGNOSIS — I2510 Atherosclerotic heart disease of native coronary artery without angina pectoris: Secondary | ICD-10-CM | POA: Diagnosis present

## 2016-09-01 DIAGNOSIS — C8330 Diffuse large B-cell lymphoma, unspecified site: Secondary | ICD-10-CM

## 2016-09-01 LAB — URINALYSIS, ROUTINE W REFLEX MICROSCOPIC
Bilirubin Urine: NEGATIVE
Glucose, UA: 50 mg/dL — AB
KETONES UR: NEGATIVE mg/dL
Leukocytes, UA: NEGATIVE
Nitrite: NEGATIVE
PROTEIN: 30 mg/dL — AB
SQUAMOUS EPITHELIAL / LPF: NONE SEEN
Specific Gravity, Urine: 1.011 (ref 1.005–1.030)
pH: 5 (ref 5.0–8.0)

## 2016-09-01 LAB — COMPREHENSIVE METABOLIC PANEL
ALK PHOS: 40 U/L (ref 38–126)
ALT: 37 U/L (ref 17–63)
ANION GAP: 9 (ref 5–15)
AST: 16 U/L (ref 15–41)
Albumin: 2.7 g/dL — ABNORMAL LOW (ref 3.5–5.0)
BUN: 45 mg/dL — ABNORMAL HIGH (ref 6–20)
CALCIUM: 7.8 mg/dL — AB (ref 8.9–10.3)
CHLORIDE: 105 mmol/L (ref 101–111)
CO2: 23 mmol/L (ref 22–32)
Creatinine, Ser: 2.07 mg/dL — ABNORMAL HIGH (ref 0.61–1.24)
GFR calc non Af Amer: 29 mL/min — ABNORMAL LOW (ref >60)
GFR, EST AFRICAN AMERICAN: 34 mL/min — AB (ref >60)
Glucose, Bld: 152 mg/dL — ABNORMAL HIGH (ref 65–99)
POTASSIUM: 4.3 mmol/L (ref 3.5–5.1)
Sodium: 137 mmol/L (ref 135–145)
Total Bilirubin: 1.8 mg/dL — ABNORMAL HIGH (ref 0.3–1.2)
Total Protein: 5.5 g/dL — ABNORMAL LOW (ref 6.5–8.1)

## 2016-09-01 LAB — I-STAT CG4 LACTIC ACID, ED: LACTIC ACID, VENOUS: 0.82 mmol/L (ref 0.5–1.9)

## 2016-09-01 MED ORDER — SODIUM CHLORIDE 0.9 % IV BOLUS (SEPSIS)
1000.0000 mL | Freq: Once | INTRAVENOUS | Status: AC
  Administered 2016-09-02: 1000 mL via INTRAVENOUS

## 2016-09-01 MED ORDER — DEXTROSE 5 % IV SOLN
2.0000 g | INTRAVENOUS | Status: DC
  Administered 2016-09-02: 2 g via INTRAVENOUS
  Filled 2016-09-01 (×2): qty 2

## 2016-09-01 MED ORDER — DEXTROSE 5 % IV SOLN
2.0000 g | INTRAVENOUS | Status: AC
  Administered 2016-09-01: 2 g via INTRAVENOUS
  Filled 2016-09-01: qty 2

## 2016-09-01 MED ORDER — SODIUM CHLORIDE 0.9 % IV BOLUS (SEPSIS)
1000.0000 mL | Freq: Once | INTRAVENOUS | Status: AC
  Administered 2016-09-01: 1000 mL via INTRAVENOUS

## 2016-09-01 NOTE — ED Notes (Signed)
Bed: WA24 Expected date:  Expected time:  Means of arrival:  Comments: EMS 

## 2016-09-01 NOTE — ED Provider Notes (Signed)
[name]   CSN: [id number] Arrival date & time: 09/01/16  2112     History   Chief Complaint Chief Complaint  Patient presents with  . Weakness  . Fever  . Urinary Retention    HPI Luke Mcbride is a 81 y.o. male.  He presents for evaluation of weakness, urinary incontinence, chills, and malaise, for several days, worsening today.  He is taking his prescribed medicines, without improvement.  He received 3 rounds of chemotherapy, last week.  This was his first exposure to chemotherapy, for lymphoma.  Occasional cough, nonproductive.  Temperature checked at home was between 101 102 about an hour before arrival.  No other prior recent problems.  There are no other known modifying factors.  HPI  Past Medical History:  Diagnosis Date  . ALLERGIC RHINITIS   . Anemia    hx  . Barrett's esophagus   . BOOP (bronchiolitis obliterans with organizing pneumonia) ([place])    a. s/p R VATS 2008.  [name] CAD (coronary artery disease)    a. 05/2000: NSTEMI/CABG x 6: LIMA->LAD, VG->D1, VG->OM1->2, VG->PDA->RPL;  b. 07/2007 MV: high lat infarct, no ischemia, EF 47%;  c. Cath/PCI: LM nl, LAD20p, 41m, D1 nl, D2 60-70ost, D3 nl, LCX 70ost, 186m, OM1/OM2 min irregs, RCA 30 diff, PDA 99, LIMA->LAD atretic, VG->D1 100, VG->OM1->2 100, VG->PDA->RPL 90p (4.0x23 Vision BMS);  c. 07/2012 Echo: EF 55%, gr1 DD.  [name] Cataract   . CKD (chronic kidney disease), stage III    a. renal bx 2008: GLN with vasculitis  . Diffuse large B cell lymphoma ([place]) 08/07/2016  . GERD (gastroesophageal reflux disease)   . Glaucoma    a. Cannot see out of Mcbride eye.  . Hematuria    Microscopic  . Hyperglycemia    Patient reported while on prednisone, had to take insulin  . Hyperlipemia   . Hypertension   . Hypothyroidism   . ILD (interstitial lung disease) ([place])   . Iritis   . Local infection of skin and subcutaneous tissue   . Membranoproliferative nephritis   . Myocardial infarction ([place]) 2002  .  Neutropenia, drug-induced ([place])   . Recurrent boils   . Residual foreign body in soft tissue   . Vasculitis ([place])    a.  pauciimmune vasculitis with renal involvement and hx of transient hemoptysis in the past with associated BOOP, 2008 (renal bx 2008: GLN with vasculitis). b. History of treatment with 2 cycles of Cytoxan and pheresis. H/o hemoptysis and pulm hemorrhage with 2nd cycle of cytoxan.    Patient Active Problem List   Diagnosis Date Noted  . Diffuse large B cell lymphoma ([place]) 08/07/2016  . S/P small bowel resection 07/12/2016  . Spinal stenosis 07/08/2015  . CKD (chronic kidney disease), stage III 07/29/2012  . NSTEMI (non-ST elevated myocardial infarction) ([place]) 07/29/2012  . Vasculitis ([place])   . ALLERGIC RHINITIS 07/16/2010  . HYPERCHOLESTEROLEMIA 12/04/2008  . ANEMIA 12/04/2008  . IRITIS 12/04/2008  . RENAL INSUFFICIENCY 12/04/2008  . Coronary atherosclerosis 07/23/2008  . GERD 04/15/2008  . Barrett's esophagus 04/15/2008  . HYPERLIPIDEMIA 07/20/2007  . Essential hypertension, benign 05/23/2007  . FOREIGN BODY, SOFT TISSUE, RESIDUAL 04/24/2007  . INTERSTITIAL LUNG DISEASE 02/05/2007  . RESTLESS LEG SYNDROME, SEVERE 01/26/2007  . NEPHRITIS, MEMBRANOPROLIFERATIVE 01/26/2007  . HYPOTHYROIDISM 11/30/2006  . DIABETES MELLITUS, TYPE II 11/30/2006  . Myocardial infarction ([place]) 05/17/2000    Past Surgical History:  Procedure Laterality Date  . BOWEL RESECTION N/A 07/12/2016   Procedure:  SMALL BOWEL RESECTION;  Surgeon: Rolm Bookbinder, MD;  Location: [facility];  Service: General;  Laterality: N/A;  . CATARACT EXTRACTION Bilateral   . CORONARY ARTERY BYPASS GRAFT  2002  . IR FLUORO GUIDE PORT INSERTION RIGHT  08/20/2016  . IR US GUIDE VASC ACCESS RIGHT  08/20/2016  . LAPAROSCOPY N/A 07/12/2016   Procedure: LAPAROSCOPY DIAGNOSTIC;  Surgeon: Rolm Bookbinder, MD;  Location: [facility];  Service: General;  Laterality: N/A;  . LEFT HEART CATHETERIZATION WITH CORONARY/GRAFT  ANGIOGRAM N/A 07/26/2012   Procedure: LEFT HEART CATHETERIZATION WITH Beatrix Fetters;  Surgeon: Wellington Hampshire, MD;  Location: [facility] CATH LAB;  Service: Cardiovascular;  Laterality: N/A;  . LUNG BIOPSY    . PERCUTANEOUS CORONARY STENT INTERVENTION (PCI-S)  07/26/2012   Procedure: PERCUTANEOUS CORONARY STENT INTERVENTION (PCI-S);  Surgeon: Wellington Hampshire, MD;  Location: [facility] CATH LAB;  Service: Cardiovascular;;  . RENAL BIOPSY    . TOE AMPUTATION  2009   hammer toe       Home Medications    Prior to Admission medications   Medication Sig Start Date End Date Taking? Authorizing Provider  allopurinol (ZYLOPRIM) 100 MG tablet Take 1 tablet (100 mg total) by mouth 2 (two) times daily. Start taking after stopping Imuran 08/13/16  Yes Gautam Juleen China, MD  aspirin EC 81 MG EC tablet Take 1 tablet (81 mg total) by mouth daily. 07/29/12  Yes Rogelia Mire, NP  clopidogrel (PLAVIX) 75 MG tablet Take 1 tablet (75 mg total) by mouth daily. 02/09/16  Yes [name], MD  dexamethasone (DECADRON) 4 MG tablet Take 1 tablet (4 mg total) by mouth 2 (two) times daily with a meal. 08/13/16  Yes Brunetta Genera, MD  finasteride (PROSCAR) 5 MG tablet Take 5 mg by mouth at bedtime.    Yes Historical Provider, MD  levothyroxine (SYNTHROID, LEVOTHROID) 100 MCG tablet Take 100 mcg by mouth daily.     Yes Historical Provider, MD  lidocaine-prilocaine (EMLA) cream Apply to affected area once 08/13/16  Yes Gautam Juleen China, MD  LORazepam (ATIVAN) 0.5 MG tablet Take 1 tablet (0.5 mg total) by mouth every 8 (eight) hours as needed for anxiety or sleep. 08/31/16  Yes Brunetta Genera, MD  metoprolol tartrate (LOPRESSOR) 25 MG tablet Take 0.5 tablets (12.5 mg total) by mouth 2 (two) times daily. 02/09/16  Yes [name], MD  Multiple Vitamin (MULTIVITAMIN) tablet Take 1 tablet by mouth daily.     Yes Historical Provider, MD  pantoprazole (PROTONIX) 40 MG tablet TAKE 1 TABLET (40 MG TOTAL) BY  MOUTH DAILY. 02/09/16  Yes [name], MD  rosuvastatin (CRESTOR) 10 MG tablet Take 10 mg by mouth at bedtime.    Yes Historical Provider, MD  Tamsulosin HCl (FLOMAX) 0.4 MG CAPS Take 0.4 mg by mouth at bedtime.    Yes Historical Provider, MD  telmisartan (MICARDIS) 40 MG tablet Take 20 mg by mouth 2 (two) times daily.    Yes Historical Provider, MD  acetaminophen (TYLENOL) 500 MG tablet Take 500-1,000 mg by mouth daily as needed for moderate pain or headache.    Historical Provider, MD  NITROSTAT 0.4 MG SL tablet PLACE 1 TABLET (0.4 MG TOTAL) UNDER THE TONGUE EVERY 5 (FIVE) MINUTES AS NEEDED. MAY REPEAT X3 Patient taking differently: Place 0.4 mg under the tongue every 5 (five) minutes as needed for chest pain. May repeat x3 02/10/16   [name], MD  ondansetron (ZOFRAN) 8 MG tablet Take 1  tablet (8 mg total) by mouth 2 (two) times daily as needed for refractory nausea / vomiting. Start on day 3 after cyclophosphamide. 08/13/16   Brunetta Genera, MD  predniSONE (DELTASONE) 20 MG tablet Take 3 tablets (60 mg total) by mouth daily. Take on days 1-5 of chemotherapy. 08/13/16   Brunetta Genera, MD  prochlorperazine (COMPAZINE) 10 MG tablet Take 1 tablet (10 mg total) by mouth every 6 (six) hours as needed (Nausea or vomiting). 08/13/16   Brunetta Genera, MD    Family History Family History  Problem Relation Age of Onset  . Heart disease Mother   . Diabetes Mother   . Prostate cancer Father   . Depression Other   . Diabetes Other   . Prostate cancer Other   . Colon polyps Neg Hx   . Colon cancer Neg Hx   . Rectal cancer Neg Hx   . Stomach cancer Neg Hx   . Esophageal cancer Neg Hx     Social History Social History  Substance Use Topics  . Smoking status: Never Smoker  . Smokeless tobacco: Never Used  . Alcohol use No     Allergies   No known allergies   Review of Systems Review of Systems  All other systems reviewed and are negative.    Physical  Exam Updated Vital Signs BP 117/67 (BP Location: Left Arm)   Pulse (!) 101   Temp (!) 102.9 F (39.4 C) (Rectal)   Resp (!) 22   Wt 220 lb (99.8 kg)   SpO2 94%   BMI 29.84 kg/m   Physical Exam  Constitutional: He is oriented to person, place, and time. He appears well-developed. No distress (Uncomfortable).  Elderly, frail, shaking chill  HENT:  Head: Normocephalic and atraumatic.  Right Ear: External ear normal.  Left Ear: External ear normal.  Eyes: Conjunctivae and EOM are normal. Pupils are equal, round, and reactive to light.  Neck: Normal range of motion and phonation normal. Neck supple.  Cardiovascular: Normal rate, regular rhythm and normal heart sounds.   Port-A-Cath site, 2 punctures right upper chest wall, healing wounds.  Pulmonary/Chest: Effort normal and breath sounds normal. He exhibits no bony tenderness.  Abdominal: Soft. There is no tenderness.  Musculoskeletal: Normal range of motion.  Neurological: He is alert and oriented to person, place, and time. No cranial nerve deficit or sensory deficit. He exhibits normal muscle tone. Coordination normal.  Skin: Skin is warm, dry and intact.  Psychiatric: He has a normal mood and affect. His behavior is normal. Judgment and thought content normal.  Nursing note and vitals reviewed.    ED Treatments / Results  Labs (all labs ordered are listed, but only abnormal results are displayed) Labs Reviewed  COMPREHENSIVE METABOLIC PANEL - Abnormal; Notable for the following:       Result Value   Glucose, Bld 152 (*)    BUN 45 (*)    Creatinine, Ser 2.07 (*)    Calcium 7.8 (*)    Total Protein 5.5 (*)    Albumin 2.7 (*)    Total Bilirubin 1.8 (*)    GFR calc non Af Amer 29 (*)    GFR calc Af Amer 34 (*)    All other components within normal limits  CBC WITH DIFFERENTIAL/PLATELET - Abnormal; Notable for the following:    WBC 0.1 (*)    RBC 2.80 (*)    Hemoglobin 9.2 (*)    HCT 27.3 (*)    RDW  18.5 (*)     Platelets 39 (*)    Neutro Abs 0.0 (*)    Lymphs Abs 0.1 (*)    Monocytes Absolute 0.0 (*)    All other components within normal limits  URINALYSIS, ROUTINE W REFLEX MICROSCOPIC - Abnormal; Notable for the following:    Glucose, UA 50 (*)    Hgb urine dipstick SMALL (*)    Protein, ur 30 (*)    Bacteria, UA RARE (*)    All other components within normal limits  CULTURE, BLOOD (ROUTINE X 2)  CULTURE, BLOOD (ROUTINE X 2)  URINE CULTURE  I-STAT CG4 LACTIC ACID, ED    EKG  EKG Interpretation None       Radiology Dg Chest Port 1 View  Result Date: 09/01/2016 CLINICAL DATA:  Initial evaluation for acute weakness, fever, urinary retention. EXAM: PORTABLE CHEST 1 VIEW COMPARISON:  Prior radiograph from 07/01/2016. FINDINGS: Right-sided Port-A-Cath in place with tip overlying the distal SVC. Median sternotomy wires underlying CABG markers and surgical clips noted. Stable mild cardiomegaly. Mediastinal silhouette normal. Aortic atherosclerosis. Lungs mildly hypoinflated. Mild patchy and hazy right basilar opacity, favored to reflect atelectasis/ bronchovascular crowding, although superimposed infiltrate could be considered in the correct clinical setting. No other focal airspace disease. No pulmonary edema or pleural effusion. No pneumothorax. No acute osseus abnormality. IMPRESSION: 1. Shallow lung inflation with patchy and hazy right basilar opacity. Atelectasis/bronchovascular crowding is favored, although mild infiltrate could be considered in the correct clinical setting. 2. Stable cardiomegaly with sequelae of prior CABG. Electronically Signed   By: Jeannine Boga M.D.   On: 09/01/2016 22:41    Procedures Procedures (including critical care time)  Medications Ordered in ED Medications  sodium chloride 0.9 % bolus 1,000 mL (0 mLs Intravenous Stopped 09/02/16 0007)    And  sodium chloride 0.9 % bolus 1,000 mL (1,000 mLs Intravenous New Bag/Given 09/02/16 0007)    And  sodium  chloride 0.9 % bolus 1,000 mL (not administered)  ceFEPIme (MAXIPIME) 2 g in dextrose 5 % 50 mL IVPB (not administered)  acetaminophen (TYLENOL) tablet 650 mg (not administered)  ceFEPIme (MAXIPIME) 2 g in dextrose 5 % 50 mL IVPB (0 g Intravenous Stopped 09/02/16 0007)     Initial Impression / Assessment and Plan / ED Course  I have reviewed the triage vital signs and the nursing notes.  Pertinent labs & imaging results that were available during my care of the patient were reviewed by me and considered in my medical decision making (see chart for details).     Medications  sodium chloride 0.9 % bolus 1,000 mL (0 mLs Intravenous Stopped 09/02/16 0007)    And  sodium chloride 0.9 % bolus 1,000 mL (1,000 mLs Intravenous New Bag/Given 09/02/16 0007)    And  sodium chloride 0.9 % bolus 1,000 mL (not administered)  ceFEPIme (MAXIPIME) 2 g in dextrose 5 % 50 mL IVPB (not administered)  acetaminophen (TYLENOL) tablet 650 mg (not administered)  ceFEPIme (MAXIPIME) 2 g in dextrose 5 % 50 mL IVPB (0 g Intravenous Stopped 09/02/16 0007)    Patient Vitals for the past 24 hrs:  BP Temp Temp src Pulse Resp SpO2 Weight  09/01/16 2317 117/67 - - (!) 101 (!) 22 94 % 220 lb (99.8 kg)  09/01/16 2215 - (!) 102.9 F (39.4 C) Rectal - - - -  09/01/16 2125 (!) 129/58 99 F (37.2 C) Oral (!) 101 14 95 % -  09/01/16 2122 - - - - -  99 % -    00: 2 5-case discussed with oncology, Dr. Earlie Server, who recommends continued treatment with Primaxin, and his oncologist, Dr. Irene Limbo will see the patient in the morning. Dr. Irene Limbo was added to the treatment team  12:34 AM Reevaluation with update and discussion. After initial assessment and treatment, an updated evaluation reveals findings discussed with patient and family members, all questions answered. Luke Mcbride   12:39 AM-Consult complete with Hospitalist. Patient case explained and discussed. He agrees to admit patient for further evaluation and treatment. Call  ended at 00:45  Final Clinical Impressions(s) / ED Diagnoses   Final diagnoses:  Chemotherapy-induced neutropenia ([place])  Healthcare-associated pneumonia   Neutropenic fever, with likely pneumonia as source.  Urinary incontinence, without urinary tract infection, urine cultures pending.  Blood cultures pending.  Normal lactate and he was dynamically stable in the emergency department..  Empiric antibiotics and high-volume saline boluses given.  Nursing Notes Reviewed/ Care Coordinated Applicable Imaging Reviewed Interpretation of Laboratory Data incorporated into ED treatment  Plan: Admit     New Prescriptions New Prescriptions   No medications on file     Daleen Bo, MD 09/02/16 0117

## 2016-09-01 NOTE — Progress Notes (Addendum)
Pharmacy Antibiotic Note  Luke Mcbride is a 81 y.o. male c/o weakness, fever and uncontrolled urination admitted on 09/01/2016 with UTI.  Pharmacy has been consulted for cefepime/vancomycin dosing.  Plan: Cefepime 2 Gm IV q24h Vancomycin 1250 mg x1 then 1 Gm IV q24h  VT=15-20 mg/L F/u scr/cultures     Temp (24hrs), Avg:99 F (37.2 C), Min:99 F (37.2 C), Max:99 F (37.2 C)   Recent Labs Lab 08/31/16 0832  WBC 0.4*  CREATININE 2.0*    Estimated Creatinine Clearance: 37.3 mL/min (A) (by C-G formula based on SCr of 2 mg/dL (H)).    Allergies  Allergen Reactions  . No Known Allergies     Antimicrobials this admission: 4/18 cefepime >>   4/19 vancomycin  >>   Dose adjustments this admission:   Microbiology results:  BCx:   UCx:    Sputum:    MRSA PCR:   Thank you for allowing pharmacy to be a part of this patient's care.  Dorrene German 09/01/2016 10:10 PM

## 2016-09-01 NOTE — ED Triage Notes (Addendum)
Patient BIB [facility] from home for weakness, fever, and uncontrolled urination  x1 day. Patient recently dx with non hodgkin's lymphoma and had first 3 rounds of chemo last week. Wife is now reporting a temp of 101 at home and  lethargy. EMS reports irregular hr with bigeminy and pvc's.

## 2016-09-02 ENCOUNTER — Other Ambulatory Visit: Payer: Self-pay

## 2016-09-02 DIAGNOSIS — N390 Urinary tract infection, site not specified: Secondary | ICD-10-CM | POA: Diagnosis not present

## 2016-09-02 DIAGNOSIS — J189 Pneumonia, unspecified organism: Secondary | ICD-10-CM | POA: Diagnosis not present

## 2016-09-02 DIAGNOSIS — Y95 Nosocomial condition: Secondary | ICD-10-CM | POA: Diagnosis present

## 2016-09-02 DIAGNOSIS — H409 Unspecified glaucoma: Secondary | ICD-10-CM | POA: Diagnosis present

## 2016-09-02 DIAGNOSIS — I2510 Atherosclerotic heart disease of native coronary artery without angina pectoris: Secondary | ICD-10-CM | POA: Diagnosis present

## 2016-09-02 DIAGNOSIS — K219 Gastro-esophageal reflux disease without esophagitis: Secondary | ICD-10-CM

## 2016-09-02 DIAGNOSIS — R338 Other retention of urine: Secondary | ICD-10-CM | POA: Diagnosis present

## 2016-09-02 DIAGNOSIS — I129 Hypertensive chronic kidney disease with stage 1 through stage 4 chronic kidney disease, or unspecified chronic kidney disease: Secondary | ICD-10-CM | POA: Diagnosis present

## 2016-09-02 DIAGNOSIS — E039 Hypothyroidism, unspecified: Secondary | ICD-10-CM

## 2016-09-02 DIAGNOSIS — C8330 Diffuse large B-cell lymphoma, unspecified site: Secondary | ICD-10-CM | POA: Diagnosis not present

## 2016-09-02 DIAGNOSIS — D709 Neutropenia, unspecified: Secondary | ICD-10-CM | POA: Diagnosis present

## 2016-09-02 DIAGNOSIS — T451X5A Adverse effect of antineoplastic and immunosuppressive drugs, initial encounter: Secondary | ICD-10-CM

## 2016-09-02 DIAGNOSIS — E7800 Pure hypercholesterolemia, unspecified: Secondary | ICD-10-CM | POA: Diagnosis present

## 2016-09-02 DIAGNOSIS — N183 Chronic kidney disease, stage 3 (moderate): Secondary | ICD-10-CM | POA: Diagnosis not present

## 2016-09-02 DIAGNOSIS — L259 Unspecified contact dermatitis, unspecified cause: Secondary | ICD-10-CM | POA: Diagnosis not present

## 2016-09-02 DIAGNOSIS — D6959 Other secondary thrombocytopenia: Secondary | ICD-10-CM | POA: Diagnosis present

## 2016-09-02 DIAGNOSIS — K2270 Barrett's esophagus without dysplasia: Secondary | ICD-10-CM | POA: Diagnosis present

## 2016-09-02 DIAGNOSIS — A4152 Sepsis due to Pseudomonas: Secondary | ICD-10-CM | POA: Diagnosis not present

## 2016-09-02 DIAGNOSIS — C8339 Diffuse large B-cell lymphoma, extranodal and solid organ sites: Secondary | ICD-10-CM | POA: Diagnosis present

## 2016-09-02 DIAGNOSIS — I10 Essential (primary) hypertension: Secondary | ICD-10-CM | POA: Diagnosis not present

## 2016-09-02 DIAGNOSIS — A419 Sepsis, unspecified organism: Secondary | ICD-10-CM | POA: Diagnosis present

## 2016-09-02 DIAGNOSIS — R5081 Fever presenting with conditions classified elsewhere: Secondary | ICD-10-CM

## 2016-09-02 DIAGNOSIS — R509 Fever, unspecified: Secondary | ICD-10-CM | POA: Diagnosis not present

## 2016-09-02 DIAGNOSIS — A4151 Sepsis due to Escherichia coli [E. coli]: Secondary | ICD-10-CM | POA: Diagnosis not present

## 2016-09-02 DIAGNOSIS — D61818 Other pancytopenia: Secondary | ICD-10-CM | POA: Diagnosis present

## 2016-09-02 DIAGNOSIS — R32 Unspecified urinary incontinence: Secondary | ICD-10-CM | POA: Diagnosis present

## 2016-09-02 DIAGNOSIS — D61810 Antineoplastic chemotherapy induced pancytopenia: Secondary | ICD-10-CM | POA: Diagnosis present

## 2016-09-02 DIAGNOSIS — K5900 Constipation, unspecified: Secondary | ICD-10-CM | POA: Diagnosis present

## 2016-09-02 DIAGNOSIS — N400 Enlarged prostate without lower urinary tract symptoms: Secondary | ICD-10-CM | POA: Diagnosis present

## 2016-09-02 DIAGNOSIS — D701 Agranulocytosis secondary to cancer chemotherapy: Secondary | ICD-10-CM | POA: Insufficient documentation

## 2016-09-02 DIAGNOSIS — Z951 Presence of aortocoronary bypass graft: Secondary | ICD-10-CM | POA: Diagnosis not present

## 2016-09-02 DIAGNOSIS — N401 Enlarged prostate with lower urinary tract symptoms: Secondary | ICD-10-CM | POA: Diagnosis present

## 2016-09-02 DIAGNOSIS — E785 Hyperlipidemia, unspecified: Secondary | ICD-10-CM | POA: Diagnosis present

## 2016-09-02 LAB — BLOOD CULTURE ID PANEL (REFLEXED)
Acinetobacter baumannii: NOT DETECTED
CANDIDA PARAPSILOSIS: NOT DETECTED
CANDIDA TROPICALIS: NOT DETECTED
CARBAPENEM RESISTANCE: NOT DETECTED
Candida albicans: NOT DETECTED
Candida glabrata: NOT DETECTED
Candida krusei: NOT DETECTED
Enterobacter cloacae complex: NOT DETECTED
Enterobacteriaceae species: DETECTED — AB
Enterococcus species: NOT DETECTED
Escherichia coli: DETECTED — AB
HAEMOPHILUS INFLUENZAE: NOT DETECTED
Klebsiella oxytoca: NOT DETECTED
Klebsiella pneumoniae: NOT DETECTED
Listeria monocytogenes: NOT DETECTED
NEISSERIA MENINGITIDIS: NOT DETECTED
PROTEUS SPECIES: NOT DETECTED
Pseudomonas aeruginosa: NOT DETECTED
SERRATIA MARCESCENS: NOT DETECTED
STAPHYLOCOCCUS SPECIES: NOT DETECTED
STREPTOCOCCUS AGALACTIAE: NOT DETECTED
STREPTOCOCCUS SPECIES: NOT DETECTED
Staphylococcus aureus (BCID): NOT DETECTED
Streptococcus pneumoniae: NOT DETECTED
Streptococcus pyogenes: NOT DETECTED

## 2016-09-02 LAB — CBC WITH DIFFERENTIAL/PLATELET
BASOS PCT: 0 %
Basophils Absolute: 0 10*3/uL (ref 0.0–0.1)
EOS ABS: 0 10*3/uL (ref 0.0–0.7)
Eosinophils Relative: 8 %
HCT: 27.3 % — ABNORMAL LOW (ref 39.0–52.0)
HEMOGLOBIN: 9.2 g/dL — AB (ref 13.0–17.0)
LYMPHS PCT: 61 %
Lymphs Abs: 0.1 10*3/uL — ABNORMAL LOW (ref 0.7–4.0)
MCH: 32.9 pg (ref 26.0–34.0)
MCHC: 33.7 g/dL (ref 30.0–36.0)
MCV: 97.5 fL (ref 78.0–100.0)
MONOS PCT: 31 %
Monocytes Absolute: 0 10*3/uL — ABNORMAL LOW (ref 0.1–1.0)
NEUTROS PCT: 0 %
Neutro Abs: 0 10*3/uL — ABNORMAL LOW (ref 1.7–7.7)
PLATELETS: 39 10*3/uL — AB (ref 150–400)
RBC: 2.8 MIL/uL — ABNORMAL LOW (ref 4.22–5.81)
RDW: 18.5 % — ABNORMAL HIGH (ref 11.5–15.5)
WBC: 0.1 10*3/uL — CL (ref 4.0–10.5)

## 2016-09-02 LAB — PROTIME-INR
INR: 1.22
Prothrombin Time: 15.5 seconds — ABNORMAL HIGH (ref 11.4–15.2)

## 2016-09-02 LAB — INFLUENZA PANEL BY PCR (TYPE A & B)
Influenza A By PCR: NEGATIVE
Influenza B By PCR: NEGATIVE

## 2016-09-02 LAB — MRSA PCR SCREENING: MRSA BY PCR: NEGATIVE

## 2016-09-02 LAB — PROCALCITONIN: PROCALCITONIN: 1.95 ng/mL

## 2016-09-02 LAB — STREP PNEUMONIAE URINARY ANTIGEN: STREP PNEUMO URINARY ANTIGEN: NEGATIVE

## 2016-09-02 LAB — APTT: APTT: 43 s — AB (ref 24–36)

## 2016-09-02 LAB — PATHOLOGIST SMEAR REVIEW

## 2016-09-02 LAB — BRAIN NATRIURETIC PEPTIDE: B Natriuretic Peptide: 288.4 pg/mL — ABNORMAL HIGH (ref 0.0–100.0)

## 2016-09-02 MED ORDER — TBO-FILGRASTIM 480 MCG/0.8ML ~~LOC~~ SOSY
480.0000 ug | PREFILLED_SYRINGE | Freq: Once | SUBCUTANEOUS | Status: AC
  Administered 2016-09-02: 480 ug via SUBCUTANEOUS
  Filled 2016-09-02: qty 0.8

## 2016-09-02 MED ORDER — METOPROLOL TARTRATE 25 MG PO TABS
12.5000 mg | ORAL_TABLET | Freq: Two times a day (BID) | ORAL | Status: DC
  Administered 2016-09-02 – 2016-09-05 (×6): 12.5 mg via ORAL
  Filled 2016-09-02 (×7): qty 1

## 2016-09-02 MED ORDER — ACETAMINOPHEN 325 MG PO TABS
650.0000 mg | ORAL_TABLET | Freq: Once | ORAL | Status: AC
  Administered 2016-09-02: 650 mg via ORAL
  Filled 2016-09-02: qty 2

## 2016-09-02 MED ORDER — NITROGLYCERIN 0.4 MG SL SUBL: 0.4000 mg | SUBLINGUAL_TABLET | SUBLINGUAL | Status: DC | PRN

## 2016-09-02 MED ORDER — TAMSULOSIN HCL 0.4 MG PO CAPS
0.4000 mg | ORAL_CAPSULE | Freq: Every day | ORAL | Status: DC
  Administered 2016-09-03 – 2016-09-05 (×3): 0.4 mg via ORAL
  Filled 2016-09-02 (×3): qty 1

## 2016-09-02 MED ORDER — ADULT MULTIVITAMIN W/MINERALS CH
1.0000 | ORAL_TABLET | Freq: Every day | ORAL | Status: DC
  Administered 2016-09-02 – 2016-09-05 (×4): 1 via ORAL
  Filled 2016-09-02 (×4): qty 1

## 2016-09-02 MED ORDER — TAMSULOSIN HCL 0.4 MG PO CAPS: 0.4000 mg | ORAL_CAPSULE | Freq: Every day | ORAL | Status: DC

## 2016-09-02 MED ORDER — SENNOSIDES-DOCUSATE SODIUM 8.6-50 MG PO TABS
1.0000 | ORAL_TABLET | Freq: Every evening | ORAL | Status: DC | PRN
Start: 2016-09-02 — End: 2016-09-05

## 2016-09-02 MED ORDER — POLYETHYLENE GLYCOL 3350 17 G PO PACK
17.0000 g | PACK | Freq: Every day | ORAL | Status: DC | PRN
Start: 2016-09-02 — End: 2016-09-05

## 2016-09-02 MED ORDER — ALBUTEROL SULFATE (2.5 MG/3ML) 0.083% IN NEBU: 3.0000 mL | INHALATION_SOLUTION | Freq: Four times a day (QID) | RESPIRATORY_TRACT | Status: DC | PRN

## 2016-09-02 MED ORDER — ONDANSETRON HCL 8 MG PO TABS: 8.0000 mg | ORAL_TABLET | Freq: Two times a day (BID) | ORAL | Status: DC | PRN

## 2016-09-02 MED ORDER — CLOPIDOGREL BISULFATE 75 MG PO TABS
75.0000 mg | ORAL_TABLET | Freq: Every day | ORAL | Status: DC
  Administered 2016-09-02 – 2016-09-05 (×4): 75 mg via ORAL
  Filled 2016-09-02 (×4): qty 1

## 2016-09-02 MED ORDER — ACETAMINOPHEN 325 MG PO TABS
650.0000 mg | ORAL_TABLET | Freq: Four times a day (QID) | ORAL | Status: DC | PRN
  Administered 2016-09-02: 650 mg via ORAL
  Filled 2016-09-02: qty 2

## 2016-09-02 MED ORDER — AZITHROMYCIN 500 MG IV SOLR
500.0000 mg | INTRAVENOUS | Status: DC
  Administered 2016-09-02 – 2016-09-05 (×4): 500 mg via INTRAVENOUS
  Filled 2016-09-02 (×5): qty 500

## 2016-09-02 MED ORDER — PANTOPRAZOLE SODIUM 40 MG PO TBEC
40.0000 mg | DELAYED_RELEASE_TABLET | Freq: Every day | ORAL | Status: DC
Start: 2016-09-02 — End: 2016-09-05
  Administered 2016-09-02 – 2016-09-05 (×4): 40 mg via ORAL
  Filled 2016-09-02 (×4): qty 1

## 2016-09-02 MED ORDER — LORATADINE 10 MG PO TABS
10.0000 mg | ORAL_TABLET | Freq: Every day | ORAL | Status: DC | PRN
Start: 2016-09-02 — End: 2016-09-05
  Administered 2016-09-02: 10 mg via ORAL
  Filled 2016-09-02: qty 1

## 2016-09-02 MED ORDER — FINASTERIDE 5 MG PO TABS
5.0000 mg | ORAL_TABLET | Freq: Every day | ORAL | Status: DC
  Administered 2016-09-02 – 2016-09-04 (×4): 5 mg via ORAL
  Filled 2016-09-02 (×4): qty 1

## 2016-09-02 MED ORDER — ASPIRIN EC 81 MG PO TBEC
81.0000 mg | DELAYED_RELEASE_TABLET | Freq: Every day | ORAL | Status: DC
  Administered 2016-09-02 – 2016-09-05 (×4): 81 mg via ORAL
  Filled 2016-09-02 (×4): qty 1

## 2016-09-02 MED ORDER — VANCOMYCIN HCL 10 G IV SOLR
1250.0000 mg | Freq: Once | INTRAVENOUS | Status: AC
  Administered 2016-09-02: 1250 mg via INTRAVENOUS
  Filled 2016-09-02: qty 1250

## 2016-09-02 MED ORDER — HYDRALAZINE HCL 20 MG/ML IJ SOLN
5.0000 mg | INTRAMUSCULAR | Status: DC | PRN
Start: 2016-09-02 — End: 2016-09-05

## 2016-09-02 MED ORDER — LEVOTHYROXINE SODIUM 100 MCG PO TABS
100.0000 ug | ORAL_TABLET | Freq: Every day | ORAL | Status: DC
  Administered 2016-09-02 – 2016-09-05 (×4): 100 ug via ORAL
  Filled 2016-09-02 (×4): qty 1

## 2016-09-02 MED ORDER — ALLOPURINOL 100 MG PO TABS
100.0000 mg | ORAL_TABLET | Freq: Two times a day (BID) | ORAL | Status: DC
  Administered 2016-09-02 – 2016-09-05 (×7): 100 mg via ORAL
  Filled 2016-09-02 (×7): qty 1

## 2016-09-02 MED ORDER — ROSUVASTATIN CALCIUM 10 MG PO TABS
10.0000 mg | ORAL_TABLET | Freq: Every day | ORAL | Status: DC
  Administered 2016-09-02 – 2016-09-04 (×4): 10 mg via ORAL
  Filled 2016-09-02 (×4): qty 1

## 2016-09-02 MED ORDER — ZOLPIDEM TARTRATE 5 MG PO TABS: 5.0000 mg | ORAL_TABLET | Freq: Every evening | ORAL | Status: DC | PRN

## 2016-09-02 MED ORDER — DM-GUAIFENESIN ER 30-600 MG PO TB12
1.0000 | ORAL_TABLET | Freq: Two times a day (BID) | ORAL | Status: DC
  Administered 2016-09-02 – 2016-09-05 (×7): 1 via ORAL
  Filled 2016-09-02 (×8): qty 1

## 2016-09-02 MED ORDER — LORAZEPAM 0.5 MG PO TABS
0.5000 mg | ORAL_TABLET | Freq: Three times a day (TID) | ORAL | Status: DC | PRN
  Administered 2016-09-03: 0.5 mg via ORAL
  Filled 2016-09-02: qty 1

## 2016-09-02 MED ORDER — PHENOL 1.4 % MT LIQD
1.0000 | OROMUCOSAL | Status: DC | PRN
  Filled 2016-09-02: qty 177

## 2016-09-02 MED ORDER — VANCOMYCIN HCL IN DEXTROSE 1-5 GM/200ML-% IV SOLN
1000.0000 mg | INTRAVENOUS | Status: DC
  Administered 2016-09-03: 1000 mg via INTRAVENOUS
  Filled 2016-09-02: qty 200

## 2016-09-02 MED ORDER — SODIUM CHLORIDE 0.9 % IV SOLN
INTRAVENOUS | Status: DC
  Administered 2016-09-02 – 2016-09-04 (×5): via INTRAVENOUS

## 2016-09-02 NOTE — Care Management Note (Signed)
Case Management Note  Patient Details  Name: Luke Mcbride MRN: [id number] Date of Birth: 1936-03-31  Subjective/Objective:         Neutropenic fever and sepsis           Action/Plan:  From Home Current chemo patient Date:  September 02, 2016 Chart reviewed for concurrent status and case management needs. Will continue to follow patient progress. Discharge Planning: following for needs Expected discharge date: [id number] Velva Harman, BSN, [place], [place]   Expected Discharge Date:                  Expected Discharge Plan:  Assisted Living / Rest Home  In-House Referral:  Clinical Social Work  Discharge planning Services  CM Consult  Post Acute Care Choice:    Choice offered to:     DME Arranged:    DME Agency:     HH Arranged:    [place] Agency:     Status of Service:  In process, will continue to follow  If discussed at Long Length of Stay Meetings, dates discussed:    Additional Comments:  Leeroy Cha, RN 09/02/2016, 8:54 AM

## 2016-09-02 NOTE — Progress Notes (Signed)
PROGRESS NOTE  Luke Mcbride KHV:[id number] DOB: 01-12-1936 DOA: 09/01/2016 PCP: Donetta Potts, MD  HPI/Recap of past 23 hours: 81 year old male past medical history of diffuse large B-cell lymphoma currently on chemotherapy, CAD status post CABG & chronic kidney disease stage III with history of membranoproliferative nephritis admitted on the early morning of 4/19 for sepsis secondary to pneumonia and also found to be neutropenic. Patient started on IV antibiotics and brought into the hospitalist service.  Patient feeling better today. Voice is not hoarse and cough is little less, although still not productive.  Assessment/Plan: Principal Problem:   Neutropenia with fever (HCC)/sepsis secondary to healthcare associated pneumonia: Have informed oncology for follow-up. Patient may need Neulasta. Continue IV antibiotics and fluids. Lactic acid level only 0.8 to admission so sepsis looks to have already resolved. Following pro-calcitonin levels. Patient met criteria for sepsis on admission given hypotension, tachycardia, fever and pulmonary source. Active Problems:   Hypothyroidism: Continue Synthroid   HYPERCHOLESTEROLEMIA: Continue statin   Essential hypertension, benign   Coronary atherosclerosis: Continue Plavix BPH continue Proscar   GERD   CKD (chronic kidney disease), stage III: At baseline   Diffuse large B cell lymphoma [facility]): Being followed by oncology.    Sepsis ([place])   Code Status: Partial code   Family Communication: Wife at the bedside   Disposition Plan: Vitals stable. Sepsis resolved. We'll transfer to floor    Consultants:  Dr. Mohammed-oncology   Procedures:  None   Antimicrobials:  IV cefepime and vancomycin for/19-present  DVT prophylaxis: SCDs   Objective: Vitals:   09/02/16 1300 09/02/16 1400 09/02/16 1500 09/02/16 1539  BP:  (!) 106/53  107/63  Pulse: 93 80 78 82  Resp: (!) _0 Temp:    98.9 F (37.2 C)  TempSrc:    Oral    SpO2: 99% 98% 99% 97%  Weight:      Height:    6' (1.829 m)    Intake/Output Summary (Last 24 hours) at 09/02/16 1611 Last data filed at 09/02/16 1500  Gross per 24 hour  Intake           3457.5 ml  Output             1300 ml  Net           2157.5 ml   Filed Weights   09/01/16 2317 09/02/16 0300  Weight: 99.8 kg (220 lb) 99.1 kg (218 lb 7.6 oz)    Exam:   General:  Alert and oriented 3, no acute distress, fatigue   Cardiovascular: Regular rate and rhythm, S1-S2   Respiratory: Clear to Auscultation bilaterally although poor inspiratory effort   Abdomen: Soft, nontender, nondistended, hypoactive bowel sounds   Musculoskeletal: No clubbing or cyanosis, trace pitting edema bilaterally   Skin: No skin breaks, tears or lesions  Psychiatry: Patient is appropriate, no evidence of psychoses    Data Reviewed: CBC:  Recent Labs Lab 08/31/16 0832 09/01/16 2300  WBC 0.4* 0.1*  NEUTROABS 0.3* 0.0*  HGB 10.5* 9.2*  HCT 31.3* 27.3*  MCV 99.7* 97.5  PLT 62* 39*   Basic Metabolic Panel:  Recent Labs Lab 08/31/16 0832 09/01/16 2300  NA 141 137  K 4.5 4.3  CL  --  105  CO2 22 23  GLUCOSE 132 152*  BUN 58.1* 45*  CREATININE 2.0* 2.07*  CALCIUM 8.5 7.8*   GFR: Estimated Creatinine Clearance: 35.9 mL/min (A) (by C-G formula based on SCr of 2.07 mg/dL (  H)). Liver Function Tests:  Recent Labs Lab 08/31/16 0832 09/01/16 2300  AST 14 16  ALT 40 37  ALKPHOS 51 40  BILITOT 1.13 1.8*  PROT 5.6* 5.5*  ALBUMIN 2.9* 2.7*   No results for input(s): LIPASE, AMYLASE in the last 168 hours. No results for input(s): AMMONIA in the last 168 hours. Coagulation Profile:  Recent Labs Lab 09/01/16 2300  INR 1.22   Cardiac Enzymes: No results for input(s): CKTOTAL, CKMB, CKMBINDEX, TROPONINI in the last 168 hours. BNP (last 3 results) No results for input(s): PROBNP in the last 8760 hours. HbA1C: No results for input(s): HGBA1C in the last 72 hours. CBG: No  results for input(s): GLUCAP in the last 168 hours. Lipid Profile: No results for input(s): CHOL, HDL, LDLCALC, TRIG, CHOLHDL, LDLDIRECT in the last 72 hours. Thyroid Function Tests: No results for input(s): TSH, T4TOTAL, FREET4, T3FREE, THYROIDAB in the last 72 hours. Anemia Panel: No results for input(s): VITAMINB12, FOLATE, FERRITIN, TIBC, IRON, RETICCTPCT in the last 72 hours. Urine analysis:    Component Value Date/Time   COLORURINE YELLOW 09/01/2016 2314   APPEARANCEUR CLEAR 09/01/2016 2314   LABSPEC 1.011 09/01/2016 2314   PHURINE 5.0 09/01/2016 2314   GLUCOSEU 50 (A) 09/01/2016 2314   GLUCOSEU NEGATIVE 11/03/2007 1116   HGBUR SMALL (A) 09/01/2016 2314   BILIRUBINUR NEGATIVE 09/01/2016 2314   KETONESUR NEGATIVE 09/01/2016 2314   PROTEINUR 30 (A) 09/01/2016 2314   UROBILINOGEN 0.2 07/17/2014 1255   NITRITE NEGATIVE 09/01/2016 2314   LEUKOCYTESUR NEGATIVE 09/01/2016 2314   Sepsis Labs: _0 (procalcitonin:4,lacticidven:4)  ) Recent Results (from the past 240 hour(s))  MRSA PCR Screening     Status: None   Collection Time: 09/02/16  2:33 AM  Result Value Ref Range Status   MRSA by PCR NEGATIVE NEGATIVE Final    Comment:        The GeneXpert MRSA Assay (FDA approved for NASAL specimens only), is one component of a comprehensive MRSA colonization surveillance program. It is not intended to diagnose MRSA infection nor to guide or monitor treatment for MRSA infections.       Studies: Dg Chest Port 1 View  Result Date: 09/01/2016 CLINICAL DATA:  Initial evaluation for acute weakness, fever, urinary retention. EXAM: PORTABLE CHEST 1 VIEW COMPARISON:  Prior radiograph from 07/01/2016. FINDINGS: Right-sided Port-A-Cath in place with tip overlying the distal SVC. Median sternotomy wires underlying CABG markers and surgical clips noted. Stable mild cardiomegaly. Mediastinal silhouette normal. Aortic atherosclerosis. Lungs mildly hypoinflated. Mild patchy and hazy  right basilar opacity, favored to reflect atelectasis/ bronchovascular crowding, although superimposed infiltrate could be considered in the correct clinical setting. No other focal airspace disease. No pulmonary edema or pleural effusion. No pneumothorax. No acute osseus abnormality. IMPRESSION: 1. Shallow lung inflation with patchy and hazy right basilar opacity. Atelectasis/bronchovascular crowding is favored, although mild infiltrate could be considered in the correct clinical setting. 2. Stable cardiomegaly with sequelae of prior CABG. Electronically Signed   By: Jeannine Boga M.D.   On: 09/01/2016 22:41    Scheduled Meds: . allopurinol  100 mg Oral BID  . aspirin EC  81 mg Oral Daily  . clopidogrel  75 mg Oral Daily  . dextromethorphan-guaiFENesin  1 tablet Oral BID  . finasteride  5 mg Oral QHS  . levothyroxine  100 mcg Oral Daily  . metoprolol tartrate  12.5 mg Oral BID  . multivitamin with minerals  1 tablet Oral Daily  . pantoprazole  40 mg Oral Daily  .  rosuvastatin  10 mg Oral QHS  . [START ON 09/03/2016] tamsulosin  0.4 mg Oral Daily  . Tbo-Filgrastim  480 mcg Subcutaneous Once    Continuous Infusions: . sodium chloride 75 mL/hr at 09/02/16 0514  . azithromycin Stopped (09/02/16 3794)  . ceFEPime (MAXIPIME) IV    . [START ON 09/03/2016] vancomycin       LOS: 0 days    Annita Brod, MD [facility] Pager [phone]  If 7PM-7AM, please contact night-coverage [email or web address] Password [email or web address] 09/02/2016, 4:11 PM

## 2016-09-02 NOTE — H&P (Addendum)
History and Physical    Luke Mcbride:[id number] DOB: [birth year] DOA: 09/01/2016  Referring MD/NP/PA:   PCP: Donetta Potts, MD   Patient coming from:  The patient is coming from home.  At baseline, pt is independent for most of ADL. SNF  Assistant living facility   Retirement center.       Chief Complaint: Fever, cough, urinary incontinence  HPI: Luke Mcbride is a 81 y.o. male with medical history significant of diffuse large B-cell lymphoma on chemotherapy, hypertension, hyperlipidemia, GERD, hypothyroidism, gout, anxiety, CAD, s/p of CABG, membranoproliferative nephritis, anemia, CKD-III, who presents with fever, cough, urinary incontinence.  Patient states that he started having fever, chills, cough since yesterday. He had temperature 101 at home. He coughs up clear mucus. Denies chest pain and SOB. Patient also reports urinary incontinence, but no burning or dysuria. He denies nausea, vomiting, diarrhea, abdominal pain. He is constipated. Patient does not have unilateral weakness, facial droop or slurred speech. No runny nose or sore throat.  ED Course: pt was found to have pancytopenia with WBC 0.1, hemoglobin 9.2, platelet 39, stable renal function, negative urinalysis, lactate 0.82, temperature 102.9, tachycardia, tachypnea, oxygen saturation 93% on room air. Chest x-ray showed patchy infiltration in the right basilar filed. Patient is admitted to stepdown as inpatient. Oncologist, Dr. Julien Nordmann was consulted by EDP, will see pt in morning.  Review of Systems:   General:  fevers, chills, no changes in body weight, has poor appetite, has fatigue HEENT: no blurry vision, hearing changes or sore throat Respiratory: has dyspnea, coughing, no heezing CV: no chest pain, no palpitations GI: no nausea, vomiting, abdominal pain, diarrhea, has constipation GU: no dysuria, burning on urination, hematuria. Has urinary incontinence.  Ext: no leg edema Neuro: no unilateral weakness,  numbness, or tingling, no vision change or hearing loss Skin: no rash, no skin tear. MSK: No muscle spasm, no deformity, no limitation of range of movement in spin Heme: No easy bruising.  Travel history: No recent long distant travel.  Allergy:  Allergies  Allergen Reactions  . No Known Allergies     Past Medical History:  Diagnosis Date  . ALLERGIC RHINITIS   . Anemia    hx  . Barrett's esophagus   . BOOP (bronchiolitis obliterans with organizing pneumonia) ([place])    a. s/p R VATS 2008.  [name] CAD (coronary artery disease)    a. 05/2000: NSTEMI/CABG x 6: LIMA->LAD, VG->D1, VG->OM1->2, VG->PDA->RPL;  b. 07/2007 MV: high lat infarct, no ischemia, EF 47%;  c. Cath/PCI: LM nl, LAD20p, 49m, D1 nl, D2 60-70ost, D3 nl, LCX 70ost, 116m, OM1/OM2 min irregs, RCA 30 diff, PDA 99, LIMA->LAD atretic, VG->D1 100, VG->OM1->2 100, VG->PDA->RPL 90p (4.0x23 Vision BMS);  c. 07/2012 Echo: EF 55%, gr1 DD.  [name] Cataract   . CKD (chronic kidney disease), stage III    a. renal bx 2008: GLN with vasculitis  . Diffuse large B cell lymphoma ([place]) 08/07/2016  . GERD (gastroesophageal reflux disease)   . Glaucoma    a. Cannot see out of L eye.  . Hematuria    Microscopic  . Hyperglycemia    Patient reported while on prednisone, had to take insulin  . Hyperlipemia   . Hypertension   . Hypothyroidism   . ILD (interstitial lung disease) ([place])   . Iritis   . Local infection of skin and subcutaneous tissue   . Membranoproliferative nephritis   . Myocardial infarction ([place]) 2002  . Neutropenia, drug-induced ([place])   .  Recurrent boils   . Residual foreign body in soft tissue   . Vasculitis ([place])    a.  pauciimmune vasculitis with renal involvement and hx of transient hemoptysis in the past with associated BOOP, 2008 (renal bx 2008: GLN with vasculitis). b. History of treatment with 2 cycles of Cytoxan and pheresis. H/o hemoptysis and pulm hemorrhage with 2nd cycle of cytoxan.    Past Surgical History:  Procedure  Laterality Date  . BOWEL RESECTION N/A 07/12/2016   Procedure: SMALL BOWEL RESECTION;  Surgeon: Rolm Bookbinder, MD;  Location: [place];  Service: General;  Laterality: N/A;  . CATARACT EXTRACTION Bilateral   . CORONARY ARTERY BYPASS GRAFT  2002  . IR FLUORO GUIDE PORT INSERTION RIGHT  08/20/2016  . IR US GUIDE VASC ACCESS RIGHT  08/20/2016  . LAPAROSCOPY N/A 07/12/2016   Procedure: LAPAROSCOPY DIAGNOSTIC;  Surgeon: Rolm Bookbinder, MD;  Location: [place];  Service: General;  Laterality: N/A;  . LEFT HEART CATHETERIZATION WITH CORONARY/GRAFT ANGIOGRAM N/A 07/26/2012   Procedure: LEFT HEART CATHETERIZATION WITH Beatrix Fetters;  Surgeon: Wellington Hampshire, MD;  Location: [place] CATH LAB;  Service: Cardiovascular;  Laterality: N/A;  . LUNG BIOPSY    . PERCUTANEOUS CORONARY STENT INTERVENTION (PCI-S)  07/26/2012   Procedure: PERCUTANEOUS CORONARY STENT INTERVENTION (PCI-S);  Surgeon: Wellington Hampshire, MD;  Location: [facility] CATH LAB;  Service: Cardiovascular;;  . RENAL BIOPSY    . TOE AMPUTATION  2009   hammer toe    Social History:  reports that he has never smoked. He has never used smokeless tobacco. He reports that he does not drink alcohol or use drugs.  Family History:  Family History  Problem Relation Age of Onset  . Heart disease Mother   . Diabetes Mother   . Prostate cancer Father   . Depression Other   . Diabetes Other   . Prostate cancer Other   . Colon polyps Neg Hx   . Colon cancer Neg Hx   . Rectal cancer Neg Hx   . Stomach cancer Neg Hx   . Esophageal cancer Neg Hx      Prior to Admission medications   Medication Sig Start Date End Date Taking? Authorizing Provider  allopurinol (ZYLOPRIM) 100 MG tablet Take 1 tablet (100 mg total) by mouth 2 (two) times daily. Start taking after stopping Imuran 08/13/16  Yes Gautam Juleen China, MD  aspirin EC 81 MG EC tablet Take 1 tablet (81 mg total) by mouth daily. 07/29/12  Yes Rogelia Mire, NP  clopidogrel (PLAVIX) 75 MG tablet  Take 1 tablet (75 mg total) by mouth daily. 02/09/16  Yes [name], MD  dexamethasone (DECADRON) 4 MG tablet Take 1 tablet (4 mg total) by mouth 2 (two) times daily with a meal. 08/13/16  Yes Brunetta Genera, MD  finasteride (PROSCAR) 5 MG tablet Take 5 mg by mouth at bedtime.    Yes Historical Provider, MD  levothyroxine (SYNTHROID, LEVOTHROID) 100 MCG tablet Take 100 mcg by mouth daily.     Yes Historical Provider, MD  lidocaine-prilocaine (EMLA) cream Apply to affected area once 08/13/16  Yes Gautam Juleen China, MD  LORazepam (ATIVAN) 0.5 MG tablet Take 1 tablet (0.5 mg total) by mouth every 8 (eight) hours as needed for anxiety or sleep. 08/31/16  Yes Brunetta Genera, MD  metoprolol tartrate (LOPRESSOR) 25 MG tablet Take 0.5 tablets (12.5 mg total) by mouth 2 (two) times daily. 02/09/16  Yes [name], MD  Multiple Vitamin (  MULTIVITAMIN) tablet Take 1 tablet by mouth daily.     Yes Historical Provider, MD  pantoprazole (PROTONIX) 40 MG tablet TAKE 1 TABLET (40 MG TOTAL) BY MOUTH DAILY. 02/09/16  Yes [name], MD  rosuvastatin (CRESTOR) 10 MG tablet Take 10 mg by mouth at bedtime.    Yes Historical Provider, MD  Tamsulosin HCl (FLOMAX) 0.4 MG CAPS Take 0.4 mg by mouth at bedtime.    Yes Historical Provider, MD  telmisartan (MICARDIS) 40 MG tablet Take 20 mg by mouth 2 (two) times daily.    Yes Historical Provider, MD  acetaminophen (TYLENOL) 500 MG tablet Take 500-1,000 mg by mouth daily as needed for moderate pain or headache.    Historical Provider, MD  NITROSTAT 0.4 MG SL tablet PLACE 1 TABLET (0.4 MG TOTAL) UNDER THE TONGUE EVERY 5 (FIVE) MINUTES AS NEEDED. MAY REPEAT X3 Patient taking differently: Place 0.4 mg under the tongue every 5 (five) minutes as needed for chest pain. May repeat x3 02/10/16   [name], MD  ondansetron (ZOFRAN) 8 MG tablet Take 1 tablet (8 mg total) by mouth 2 (two) times daily as needed for refractory nausea / vomiting. Start on day 3 after  cyclophosphamide. 08/13/16   Brunetta Genera, MD  predniSONE (DELTASONE) 20 MG tablet Take 3 tablets (60 mg total) by mouth daily. Take on days 1-5 of chemotherapy. 08/13/16   Brunetta Genera, MD  prochlorperazine (COMPAZINE) 10 MG tablet Take 1 tablet (10 mg total) by mouth every 6 (six) hours as needed (Nausea or vomiting). 08/13/16   Brunetta Genera, MD    Physical Exam: Vitals:   09/02/16 0130 09/02/16 0200 09/02/16 0233 09/02/16 0300  BP: 109/60 (!) 100/59 (!) 98/50   Pulse: (!) 105 89    Resp: (!) 24 17    Temp:   (!) 101.1 F (38.4 C)   TempSrc:   Oral   SpO2: 92% 96%    Weight:    99.1 kg (218 lb 7.6 oz)  Height:    6' (1.829 m)   General: Not in acute distress HEENT:       Eyes: PERRL, EOMI, no scleral icterus.       ENT: No discharge from the ears and nose, no pharynx injection, no tonsillar enlargement.        Neck: No JVD, no bruit, no mass felt. Heme: No neck lymph node enlargement. Cardiac: S1/S2, RRR, No murmurs, No gallops or rubs. Respiratory: No rales, wheezing, rhonchi or rubs. GI: Soft, nondistended, nontender, no rebound pain, no organomegaly, BS present. GU: No hematuria Ext: No pitting leg edema bilaterally. 2+DP/PT pulse bilaterally. Musculoskeletal: No joint deformities, No joint redness or warmth, no limitation of ROM in spin. Skin: No rashes.  Neuro: Alert, oriented X3, cranial nerves II-XII grossly intact, moves all extremities normally.  Psych: Patient is not psychotic, no suicidal or hemocidal ideation.  Labs on Admission: I have personally reviewed following labs and imaging studies  CBC:  Recent Labs Lab 08/31/16 0832 09/01/16 2300  WBC 0.4* 0.1*  NEUTROABS 0.3* 0.0*  HGB 10.5* 9.2*  HCT 31.3* 27.3*  MCV 99.7* 97.5  PLT 62* 39*   Basic Metabolic Panel:  Recent Labs Lab 08/31/16 0832 09/01/16 2300  NA 141 137  K 4.5 4.3  CL  --  105  CO2 22 23  GLUCOSE 132 152*  BUN 58.1* 45*  CREATININE 2.0* 2.07*  CALCIUM 8.5 7.8*    GFR: Estimated Creatinine Clearance: 35.9 mL/min (  A) (by C-G formula based on SCr of 2.07 mg/dL (H)). Liver Function Tests:  Recent Labs Lab 08/31/16 0832 09/01/16 2300  AST 14 16  ALT 40 37  ALKPHOS 51 40  BILITOT 1.13 1.8*  PROT 5.6* 5.5*  ALBUMIN 2.9* 2.7*   No results for input(s): LIPASE, AMYLASE in the last 168 hours. No results for input(s): AMMONIA in the last 168 hours. Coagulation Profile:  Recent Labs Lab 09/01/16 2300  INR 1.22   Cardiac Enzymes: No results for input(s): CKTOTAL, CKMB, CKMBINDEX, TROPONINI in the last 168 hours. BNP (last 3 results) No results for input(s): PROBNP in the last 8760 hours. HbA1C: No results for input(s): HGBA1C in the last 72 hours. CBG: No results for input(s): GLUCAP in the last 168 hours. Lipid Profile: No results for input(s): CHOL, HDL, LDLCALC, TRIG, CHOLHDL, LDLDIRECT in the last 72 hours. Thyroid Function Tests: No results for input(s): TSH, T4TOTAL, FREET4, T3FREE, THYROIDAB in the last 72 hours. Anemia Panel: No results for input(s): VITAMINB12, FOLATE, FERRITIN, TIBC, IRON, RETICCTPCT in the last 72 hours. Urine analysis:    Component Value Date/Time   COLORURINE YELLOW 09/01/2016 2314   APPEARANCEUR CLEAR 09/01/2016 2314   LABSPEC 1.011 09/01/2016 2314   PHURINE 5.0 09/01/2016 2314   GLUCOSEU 50 (A) 09/01/2016 2314   GLUCOSEU NEGATIVE 11/03/2007 1116   HGBUR SMALL (A) 09/01/2016 2314   BILIRUBINUR NEGATIVE 09/01/2016 2314   KETONESUR NEGATIVE 09/01/2016 2314   PROTEINUR 30 (A) 09/01/2016 2314   UROBILINOGEN 0.2 07/17/2014 1255   NITRITE NEGATIVE 09/01/2016 2314   LEUKOCYTESUR NEGATIVE 09/01/2016 2314   Sepsis Labs: @LABRCNTIP (procalcitonin:4,lacticidven:4) )No results found for this or any previous visit (from the past 240 hour(s)).   Radiological Exams on Admission: Dg Chest Port 1 View  Result Date: 09/01/2016 CLINICAL DATA:  Initial evaluation for acute weakness, fever, urinary retention.  EXAM: PORTABLE CHEST 1 VIEW COMPARISON:  Prior radiograph from 07/01/2016. FINDINGS: Right-sided Port-A-Cath in place with tip overlying the distal SVC. Median sternotomy wires underlying CABG markers and surgical clips noted. Stable mild cardiomegaly. Mediastinal silhouette normal. Aortic atherosclerosis. Lungs mildly hypoinflated. Mild patchy and hazy right basilar opacity, favored to reflect atelectasis/ bronchovascular crowding, although superimposed infiltrate could be considered in the correct clinical setting. No other focal airspace disease. No pulmonary edema or pleural effusion. No pneumothorax. No acute osseus abnormality. IMPRESSION: 1. Shallow lung inflation with patchy and hazy right basilar opacity. Atelectasis/bronchovascular crowding is favored, although mild infiltrate could be considered in the correct clinical setting. 2. Stable cardiomegaly with sequelae of prior CABG. Electronically Signed   By: Jeannine Boga M.D.   On: 09/01/2016 22:41     EKG: Independently reviewed.  Sinus rhythm, QTC 393, early R-wave progression, Q-wave in inferior leads which is old.  Assessment/Plan Principal Problem:   Neutropenia with fever (HCC) Active Problems:   Hypothyroidism   HYPERCHOLESTEROLEMIA   Essential hypertension, benign   Coronary atherosclerosis   GERD   CKD (chronic kidney disease), stage III   Diffuse large B cell lymphoma (HCC)   HCAP (healthcare-associated pneumonia)   Pancytopenia (HCC)   Sepsis ([place])   Neutropenia with fever and possible HCAP and sepsis: Patient has cough, fever and Chest x-ray showed patchy infiltration in the right basilar filed, indicating possible HCAP. Patient meets criteria for sepsis with neutropenia, fever, tachycardia and tachypnea. Lactate is normal. Currently hemodynamically stable.  - Will admit to SDU as inpt. - IV Vancomycin, cefepime, azithromycin - Mucinex for cough  - Xopenex Neb prn  for SOB - Urine legionella and S. pneumococcal  antigen - Follow up blood culture x2, sputum culture, plus Flu pcr - will get Procalcitonin and trend lactic acid level per sepsis protocol - IVF: 3L of NS bolus in ED, followed by 75 mL per hour of NS  Diffuse large B cell lymphoma: f/u with oncologist, Dr. Irene Limbo and [place], Saverio Danker. Pt just finished 3rd round chemotherapy. -f/u with oncologist. -Dr. Julien Nordmann was consulted by EDP, will see patient in morning  Hypothyroidism: Last TSH was 1.62 on 05/23/07 -Continue home Synthroid  HLD: -crestor  HTN: -will hold Temisartan since pt is at risk of developing hypotension due to sepsis -Continue metoprolol -IV hydralazine when necessary  Coronary atherosclerosis: s/p of CBAG. No CP -continue ASA, Crestor and metoprolol  GERD: -Protonix  CKD (chronic kidney disease), stage III: Stable. Baseline creatinine 2.0-2.2. His creatinine is 2.07 on admission. -Follow-up renal function by BMP  Pancytopenia [facility]): Due to chemotherapy. WBC 0.1, hemoglobin 9.2, platelet 39. No bleeding tendency. -Follow-up by CBC    DVT ppx: SCD Code Status:  Partial code (I discussed with the patient in the presence of his son and wife, and explained the meaning of CODE STATUS, patient wants to be partial code. No CPR, but OK with intubation. He also specifically states that he wants to use vasopressor if needed). Family Communication: Yes, patient's  Daughter and the wife  at bed side Disposition Plan:  Anticipate discharge back to previous home environment Consults called:  Oncologist, Dr. Julien Nordmann Admission status: SDU/inpation       Date of Service 09/02/2016    Ivor Costa [facility] Pager [phone]  If 7PM-7AM, please contact night-coverage [email or web address] Password [email or web address] 09/02/2016, 3:41 AM

## 2016-09-02 NOTE — Progress Notes (Signed)
PHARMACY - PHYSICIAN COMMUNICATION CRITICAL VALUE ALERT - BLOOD CULTURE IDENTIFICATION (BCID)  Results for orders placed or performed during the hospital encounter of 09/01/16  Blood Culture ID Panel (Reflexed) (Collected: 09/01/2016 11:07 PM)  Result Value Ref Range   Enterococcus species NOT DETECTED NOT DETECTED   Listeria monocytogenes NOT DETECTED NOT DETECTED   Staphylococcus species NOT DETECTED NOT DETECTED   Staphylococcus aureus NOT DETECTED NOT DETECTED   Streptococcus species NOT DETECTED NOT DETECTED   Streptococcus agalactiae NOT DETECTED NOT DETECTED   Streptococcus pneumoniae NOT DETECTED NOT DETECTED   Streptococcus pyogenes NOT DETECTED NOT DETECTED   Acinetobacter baumannii NOT DETECTED NOT DETECTED   Enterobacteriaceae species DETECTED (A) NOT DETECTED   Enterobacter cloacae complex NOT DETECTED NOT DETECTED   Escherichia coli DETECTED (A) NOT DETECTED   Klebsiella oxytoca NOT DETECTED NOT DETECTED   Klebsiella pneumoniae NOT DETECTED NOT DETECTED   Proteus species NOT DETECTED NOT DETECTED   Serratia marcescens NOT DETECTED NOT DETECTED   Carbapenem resistance NOT DETECTED NOT DETECTED   Haemophilus influenzae NOT DETECTED NOT DETECTED   Neisseria meningitidis NOT DETECTED NOT DETECTED   Pseudomonas aeruginosa NOT DETECTED NOT DETECTED   Candida albicans NOT DETECTED NOT DETECTED   Candida glabrata NOT DETECTED NOT DETECTED   Candida krusei NOT DETECTED NOT DETECTED   Candida parapsilosis NOT DETECTED NOT DETECTED   Candida tropicalis NOT DETECTED NOT DETECTED    Name of physician (or Provider) Contacted: Kirby-Graham  Changes to prescribed antibiotics required: none, already on Cefepime. Consider narrowing antibiotics tomorrow depending on clinical stability  Berton Butrick A 09/02/2016  8:54 PM

## 2016-09-02 NOTE — Progress Notes (Signed)
Luke Mcbride   HEMATOLOGY/ONCOLOGY INPATIENT PROGRESS NOTE  Date of Service: 09/02/2016  Inpatient Attending: .Annita Brod, MD   SUBJECTIVE  Luke Mcbride was admitted to [facility] with neutropenic fevers and a right lower lobe pneumonia. He notes that he is feeling better and his fevers are resolving with broad-spectrum antibiotics. Not needing any oxygen at this time. Notes that his breathing is stable. Notes that he had issues with his Ativan causing confusion. No other acute new symptoms. We discussed that we are hoping the Neulasta that he received ounces his counts but might do additional doses of granix. No issues with bleeding. Also noted to have thrombocytopenia and some anemia that is developing. Discussed that we will have to keep him in the hospital because of his neutropenia resolves.   OBJECTIVE:  NAD  PHYSICAL EXAMINATION: . Vitals:   09/02/16 0900 09/02/16 1000 09/02/16 1100 09/02/16 1200  BP:  117/69  115/60  Pulse: 78 74 78 76  Resp: 19 13 (!) 21 20  Temp:      TempSrc:      SpO2: 95% 98% 97% 97%  Weight:      Height:       Filed Weights   09/01/16 2317 09/02/16 0300  Weight: 220 lb (99.8 kg) 218 lb 7.6 oz (99.1 kg)   .Body mass index is 29.63 kg/m.  GENERAL:alert, in no acute distress and comfortable SKIN: skin color, texture, turgor are normal, no rashes or significant lesions EYES: normal, conjunctiva are pink and non-injected, sclera clear OROPHARYNX:no exudate, no erythema and lips, buccal mucosa, and tongue normal  NECK: supple, no JVD, thyroid normal size, non-tender, without nodularity LYMPH:  no palpable lymphadenopathy in the cervical, axillary or inguinal LUNGS:few basal rales. HEART: regular rate & rhythm,  no murmurs and trace lower extremity edema ABDOMEN: abdomen soft, non-tender, normoactive bowel sounds  Musculoskeletal: no cyanosis of digits and no clubbing  PSYCH: alert & oriented x 3 with fluent speech NEURO: no focal  motor/sensory deficits  MEDICAL HISTORY:  Past Medical History:  Diagnosis Date  . ALLERGIC RHINITIS   . Anemia    hx  . Barrett's esophagus   . BOOP (bronchiolitis obliterans with organizing pneumonia) ([place])    a. s/p R VATS 2008.  Luke Mcbride CAD (coronary artery disease)    a. 05/2000: NSTEMI/CABG x 6: LIMA->LAD, VG->D1, VG->OM1->2, VG->PDA->RPL;  b. 07/2007 MV: high lat infarct, no ischemia, EF 47%;  c. Cath/PCI: LM nl, LAD20p, 82m, D1 nl, D2 60-70ost, D3 nl, LCX 70ost, 142m, OM1/OM2 min irregs, RCA 30 diff, PDA 99, LIMA->LAD atretic, VG->D1 100, VG->OM1->2 100, VG->PDA->RPL 90p (4.0x23 Vision BMS);  c. 07/2012 Echo: EF 55%, gr1 DD.  Luke Mcbride Cataract   . CKD (chronic kidney disease), stage III    a. renal bx 2008: GLN with vasculitis  . Diffuse large B cell lymphoma ([place]) 08/07/2016  . GERD (gastroesophageal reflux disease)   . Glaucoma    a. Cannot see out of L eye.  . Hematuria    Microscopic  . Hyperglycemia    Patient reported while on prednisone, had to take insulin  . Hyperlipemia   . Hypertension   . Hypothyroidism   . ILD (interstitial lung disease) ([place])   . Iritis   . Local infection of skin and subcutaneous tissue   . Membranoproliferative nephritis   . Myocardial infarction ([place]) 2002  . Neutropenia, drug-induced ([place])   . Recurrent boils   . Residual foreign body in soft tissue   .  Vasculitis ([place])    a.  pauciimmune vasculitis with renal involvement and hx of transient hemoptysis in the past with associated BOOP, 2008 (renal bx 2008: GLN with vasculitis). b. History of treatment with 2 cycles of Cytoxan and pheresis. H/o hemoptysis and pulm hemorrhage with 2nd cycle of cytoxan.    SURGICAL HISTORY: Past Surgical History:  Procedure Laterality Date  . BOWEL RESECTION N/A 07/12/2016   Procedure: SMALL BOWEL RESECTION;  Surgeon: Rolm Bookbinder, MD;  Location: [place];  Service: General;  Laterality: N/A;  . CATARACT EXTRACTION Bilateral   . CORONARY ARTERY BYPASS GRAFT  2002    . IR FLUORO GUIDE PORT INSERTION RIGHT  08/20/2016  . IR US GUIDE VASC ACCESS RIGHT  08/20/2016  . LAPAROSCOPY N/A 07/12/2016   Procedure: LAPAROSCOPY DIAGNOSTIC;  Surgeon: Rolm Bookbinder, MD;  Location: [place];  Service: General;  Laterality: N/A;  . LEFT HEART CATHETERIZATION WITH CORONARY/GRAFT ANGIOGRAM N/A 07/26/2012   Procedure: LEFT HEART CATHETERIZATION WITH Beatrix Fetters;  Surgeon: Wellington Hampshire, MD;  Location: [place] CATH LAB;  Service: Cardiovascular;  Laterality: N/A;  . LUNG BIOPSY    . PERCUTANEOUS CORONARY STENT INTERVENTION (PCI-S)  07/26/2012   Procedure: PERCUTANEOUS CORONARY STENT INTERVENTION (PCI-S);  Surgeon: Wellington Hampshire, MD;  Location: [facility] CATH LAB;  Service: Cardiovascular;;  . RENAL BIOPSY    . TOE AMPUTATION  2009   hammer toe    SOCIAL HISTORY: Social History   Social History  . Marital status: Married    Spouse name: N/A  . Number of children: N/A  . Years of education: N/A   Occupational History  . [employer] History Main Topics  . Smoking status: Never Smoker  . Smokeless tobacco: Never Used  . Alcohol use No  . Drug use: No  . Sexual activity: Not on file   Other Topics Concern  . Not on file   Social History Narrative   Regular exercise - yes      [facility] (07/12/16):   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  no pets currently. No bird or mold exposure.    FAMILY HISTORY: Family History  Problem Relation Age of Onset  . Heart disease Mother   . Diabetes Mother   . Prostate cancer Father   . Depression Other   . Diabetes Other   . Prostate cancer Other   . Colon polyps Neg Hx   . Colon cancer Neg Hx   . Rectal cancer Neg Hx   . Stomach cancer Neg Hx   . Esophageal cancer Neg Hx     ALLERGIES:  is allergic to no known allergies.  MEDICATIONS:  Scheduled Meds: . allopurinol  100 mg Oral BID  . aspirin EC  81 mg Oral Daily  . clopidogrel  75 mg Oral Daily  .  dextromethorphan-guaiFENesin  1 tablet Oral BID  . finasteride  5 mg Oral QHS  . levothyroxine  100 mcg Oral Daily  . metoprolol tartrate  12.5 mg Oral BID  . multivitamin with minerals  1 tablet Oral Daily  . pantoprazole  40 mg Oral Daily  . rosuvastatin  10 mg Oral QHS  . [START ON 09/03/2016] tamsulosin  0.4 mg Oral Daily  . Tbo-Filgrastim  480 mcg Subcutaneous Once   Continuous Infusions: . sodium chloride 75 mL/hr at 09/02/16 0514  . azithromycin Stopped (09/02/16 7001)  . ceFEPime (MAXIPIME) IV    . [START ON 09/03/2016] vancomycin  PRN Meds:.acetaminophen, albuterol, hydrALAZINE, LORazepam, nitroGLYCERIN, ondansetron, polyethylene glycol, senna-docusate, zolpidem  REVIEW OF SYSTEMS:    10 Point review of Systems was done is negative except as noted above.   LABORATORY DATA:  I have reviewed the data as listed  . CBC Latest Ref Rng & Units 09/01/2016 08/31/2016 08/20/2016  WBC 4.0 - 10.5 K/uL 0.1(LL) 0.4(LL) 8.1  Hemoglobin 13.0 - 17.0 g/dL 9.2(L) 10.5(L) 10.3(L)  Hematocrit 39.0 - 52.0 % 27.3(L) 31.3(L) 31.1(L)  Platelets 150 - 400 K/uL 39(L) 62(L) 242    . CMP Latest Ref Rng & Units 09/01/2016 08/31/2016 08/20/2016  Glucose 65 - 99 mg/dL 152(H) 132 103(H)  BUN 6 - 20 mg/dL 45(H) 58.1(H) 57(H)  Creatinine 0.61 - 1.24 mg/dL 2.07(H) 2.0(H) 2.07(H)  Sodium 135 - 145 mmol/L 137 141 134(L)  Potassium 3.5 - 5.1 mmol/L 4.3 4.5 5.0  Chloride 101 - 111 mmol/L 105 - 105  CO2 22 - 32 mmol/L 23 22 21(L)  Calcium 8.9 - 10.3 mg/dL 7.8(L) 8.5 9.9  Total Protein 6.5 - 8.1 g/dL 5.5(L) 5.6(L) -  Total Bilirubin 0.3 - 1.2 mg/dL 1.8(H) 1.13 -  Alkaline Phos 38 - 126 U/L 40 51 -  AST 15 - 41 U/L 16 14 -  ALT 17 - 63 U/L 37 40 -     RADIOGRAPHIC STUDIES: I have personally reviewed the radiological images as listed and agreed with the findings in the report. Nm Pet Image Initial (pi) Skull Base To Thigh  Result Date: 08/06/2016 CLINICAL DATA:  Initial treatment strategy for diffuse  large B-cell non-Hodgkin's lymphoma. EXAM: NUCLEAR MEDICINE PET SKULL BASE TO THIGH TECHNIQUE: 10.8 mCi F-18 FDG was injected intravenously. Full-ring PET imaging was performed from the skull base to thigh after the radiotracer. CT data was obtained and used for attenuation correction and anatomic localization. FASTING BLOOD GLUCOSE:  Value: 120 mg/dl COMPARISON:  CT abdomen from 06/15/2016 FINDINGS: NECK No hypermetabolic lymph nodes in the neck. CHEST Several hypermetabolic right axillary lymph nodes, including a 0.9 cm in short axis node on image 62/4 with maximum SUV 29.4. In the pericardial adipose tissue there several hypermetabolic lymph nodes including a 1.1 cm node on image 94/4 with maximum SUV 14.7. Focal hypermetabolic activity along the left ventricular myocardium is present in the anterolateral wall, probably incidental, but somewhat notably focally prominent with maximum SUV 9.8. Coronary, aortic arch, and branch vessel atherosclerotic vascular disease. Prior CABG. There is evidence of old granulomatous disease including a calcified granuloma posteriorly in the right upper lobe on image 29/8. ABDOMEN/PELVIS Innumerable hypermetabolic lesions are present throughout the liver including tracking along the liver capsule. One larger lesion in segment 4 has a maximum SUV of 28.9, and a long-axis diameter of the hypermetabolic activity at 4.9 cm. Small foci of hypermetabolic activity are present along the margin of the spleen. Hypermetabolic porta hepatis and celiac chain lymph nodes are present and there scattered hypermetabolic nodules along the upper omentum. A proximal celiac chain lymph node with short axis diameter of 1.1 cm on image 112 of series 4 has a maximum SUV of 22.5 an omental lesion measuring 8 mm in diameter anterior to the transverse colon on image 112/4 has a maximum SUV of 6.8. There are hypodense renal lesions. I am uncertain whether the focal hypermetabolic activity along the left renal  collecting system is due to the adjacent hypodense lesion or collecting system activity, I favor the latter. Tiny hypermetabolic retroperitoneal lymph nodes are present. A hypermetabolic omental tumor deposit  incorporated into a small Spigelian hernia on the left measures 0.9 by 1.0 cm on image 176/4 and has a maximum SUV of 15.9. SKELETON Abnormal focal hypermetabolic activity in the anterior wall of the left acetabulum has maximum SUV of 29.4, compatible with malignancy. This is relatively inconspicuous on the CT data. A similar lesion in the left iliac bone has a maximum SUV of 22.8. Anterolisthesis at L5-S1 with suspected pars defects at L5. Small hernias of adipose tissue are present along the midline laparotomy scar. IMPRESSION: 1. In addition to hypermetabolic nodal involvement of the right axilla, pericardial space, porta hepatis/mesenteric root, and retroperitoneum, there is extensive involvement of the liver as well as peritoneal involvement along the liver surface, splenic surface, and upper omentum. 2. Hypermetabolic bony lesions in the left pelvis compatible with malignant involvement. 3. There is a focus of hypermetabolic activity along the anterolateral wall of the left ventricle. Normally such findings turn out to simply be hypermetabolic myocardium. Strictly speaking given how focal this appears I cannot completely exclude the possibility of tumor involvement within or along the myocardium. Electronically Signed   By: Van Clines M.D.   On: 08/06/2016 12:48   Ir US Guide Vasc Access Right  Result Date: 08/20/2016 INDICATION: 81 year old male with B-cell lymphoma EXAM: IMPLANTED PORT A CATH PLACEMENT WITH ULTRASOUND AND FLUOROSCOPIC GUIDANCE MEDICATIONS: 2.0 g Ancef; The antibiotic was administered within an appropriate time interval prior to skin puncture. ANESTHESIA/SEDATION: Moderate (conscious) sedation was employed during this procedure. A total of Versed 2.0 mg and Fentanyl 50 mcg was  administered intravenously. Moderate Sedation Time: 18 minutes. The patient's level of consciousness and vital signs were monitored continuously by radiology nursing throughout the procedure under my direct supervision. FLUOROSCOPY TIME:  Zero minutes, 6 seconds (1.3 mGy) COMPLICATIONS: None PROCEDURE: The procedure, risks, benefits, and alternatives were explained to the patient. Questions regarding the procedure were encouraged and answered. The patient understands and consents to the procedure. Ultrasound survey was performed with images stored and sent to PACs. The right neck and chest was prepped with chlorhexidine, and draped in the usual sterile fashion using maximum barrier technique (cap and mask, sterile gown, sterile gloves, large sterile sheet, hand hygiene and cutaneous antiseptic). Antibiotic prophylaxis was provided with 2.0g Ancef administered IV one hour prior to skin incision. Local anesthesia was attained by infiltration with 1% lidocaine without epinephrine. Ultrasound demonstrated patency of the right internal jugular vein, and this was documented with an image. Under real-time ultrasound guidance, this vein was accessed with a 21 gauge micropuncture needle and image documentation was performed. A small dermatotomy was made at the access site with an 11 scalpel. A 0.018" wire was advanced into the SVC and used to estimate the length of the internal catheter. The access needle exchanged for a 43F micropuncture vascular sheath. The 0.018" wire was then removed and a 0.035" wire advanced into the IVC. An appropriate location for the subcutaneous reservoir was selected below the clavicle and an incision was made through the skin and underlying soft tissues. The subcutaneous tissues were then dissected using a combination of blunt and sharp surgical technique and a pocket was formed. A single lumen power injectable portacatheter was then tunneled through the subcutaneous tissues from the pocket to the  dermatotomy and the port reservoir placed within the subcutaneous pocket. The venous access site was then serially dilated and a peel away vascular sheath placed over the wire. The wire was removed and the port catheter advanced into position under  fluoroscopic guidance. The catheter tip is positioned in the cavoatrial junction. This was documented with a spot image. The portacatheter was then tested and found to flush and aspirate well. The port was flushed with saline followed by 100 units/mL heparinized saline. The pocket was then closed in two layers using first subdermal inverted interrupted absorbable sutures followed by a running subcuticular suture. The epidermis was then sealed with Dermabond. The dermatotomy at the venous access site was also seal with Dermabond. Patient tolerated the procedure well and remained hemodynamically stable throughout. No complications encountered and no significant blood loss encountered IMPRESSION: Status post right IJ port catheter placement. Catheter ready for use. Signed, Dulcy Fanny. Earleen Newport, DO [facility] Radiology Electronically Signed   By: Corrie Mckusick D.O.   On: 08/20/2016 13:54   Dg Chest Port 1 View  Result Date: 09/01/2016 CLINICAL DATA:  Initial evaluation for acute weakness, fever, urinary retention. EXAM: PORTABLE CHEST 1 VIEW COMPARISON:  Prior radiograph from 07/01/2016. FINDINGS: Right-sided Port-A-Cath in place with tip overlying the distal SVC. Median sternotomy wires underlying CABG markers and surgical clips noted. Stable mild cardiomegaly. Mediastinal silhouette normal. Aortic atherosclerosis. Lungs mildly hypoinflated. Mild patchy and hazy right basilar opacity, favored to reflect atelectasis/ bronchovascular crowding, although superimposed infiltrate could be considered in the correct clinical setting. No other focal airspace disease. No pulmonary edema or pleural effusion. No pneumothorax. No acute  osseus abnormality. IMPRESSION: 1. Shallow lung inflation with patchy and hazy right basilar opacity. Atelectasis/bronchovascular crowding is favored, although mild infiltrate could be considered in the correct clinical setting. 2. Stable cardiomegaly with sequelae of prior CABG. Electronically Signed   By: Jeannine Boga M.D.   On: 09/01/2016 22:41   Ir Fluoro Guide Port Insertion Right  Result Date: 08/20/2016 INDICATION: 81 year old male with B-cell lymphoma EXAM: IMPLANTED PORT A CATH PLACEMENT WITH ULTRASOUND AND FLUOROSCOPIC GUIDANCE MEDICATIONS: 2.0 g Ancef; The antibiotic was administered within an appropriate time interval prior to skin puncture. ANESTHESIA/SEDATION: Moderate (conscious) sedation was employed during this procedure. A total of Versed 2.0 mg and Fentanyl 50 mcg was administered intravenously. Moderate Sedation Time: 18 minutes. The patient's level of consciousness and vital signs were monitored continuously by radiology nursing throughout the procedure under my direct supervision. FLUOROSCOPY TIME:  Zero minutes, 6 seconds (1.3 mGy) COMPLICATIONS: None PROCEDURE: The procedure, risks, benefits, and alternatives were explained to the patient. Questions regarding the procedure were encouraged and answered. The patient understands and consents to the procedure. Ultrasound survey was performed with images stored and sent to PACs. The right neck and chest was prepped with chlorhexidine, and draped in the usual sterile fashion using maximum barrier technique (cap and mask, sterile gown, sterile gloves, large sterile sheet, hand hygiene and cutaneous antiseptic). Antibiotic prophylaxis was provided with 2.0g Ancef administered IV one hour prior to skin incision. Local anesthesia was attained by infiltration with 1% lidocaine without epinephrine. Ultrasound demonstrated patency of the right internal jugular vein, and this was documented with an image. Under real-time ultrasound guidance,  this vein was accessed with a 21 gauge micropuncture needle and image documentation was performed. A small dermatotomy was made at the access site with an 11 scalpel. A 0.018" wire was advanced into the SVC and used to estimate the length of the internal catheter. The access needle exchanged for a 60F micropuncture vascular sheath. The 0.018" wire was then removed and a 0.035" wire advanced into the IVC. An appropriate location for the subcutaneous reservoir was selected  below the clavicle and an incision was made through the skin and underlying soft tissues. The subcutaneous tissues were then dissected using a combination of blunt and sharp surgical technique and a pocket was formed. A single lumen power injectable portacatheter was then tunneled through the subcutaneous tissues from the pocket to the dermatotomy and the port reservoir placed within the subcutaneous pocket. The venous access site was then serially dilated and a peel away vascular sheath placed over the wire. The wire was removed and the port catheter advanced into position under fluoroscopic guidance. The catheter tip is positioned in the cavoatrial junction. This was documented with a spot image. The portacatheter was then tested and found to flush and aspirate well. The port was flushed with saline followed by 100 units/mL heparinized saline. The pocket was then closed in two layers using first subdermal inverted interrupted absorbable sutures followed by a running subcuticular suture. The epidermis was then sealed with Dermabond. The dermatotomy at the venous access site was also seal with Dermabond. Patient tolerated the procedure well and remained hemodynamically stable throughout. No complications encountered and no significant blood loss encountered IMPRESSION: Status post right IJ port catheter placement. Catheter ready for use. Signed, Dulcy Fanny. Earleen Newport, DO [facility] Radiology Electronically  Signed   By: Corrie Mckusick D.O.   On: 08/20/2016 13:54    ASSESSMENT & PLAN:   81 yo male with multiple medical co-morbidities including cardiac co-morbidities, IPF, vascular on chronic immunosupression with Imuran (now off)  1) Newly diagnosed Diffuse large B-cell lymphoma involving the small intestine (germinal cell phenotype) with complete thickness involvement of bowel and abutting serosa.  PET/CT scan shows extensive involvement with DLBCL, hypermetabolic nodal involvement of the right axilla, pericardial space, porta hepatis/mesenteric root, and retroperitoneum, there is extensive involvement of the liver as well as peritoneal involvement along the liver surface, splenic surface, and upper omentum. 2. Hypermetabolic bony lesions in the left pelvis compatible with malignant involvement. 3. There is a focus of hypermetabolic activity along the anterolateral wall of the left ventricle.   Patient is s/p 1st cycle of R-CEOP and received neulasta on 4/12 2) chronic kidney disease stage III baseline creatinine about 2 3) coronary disease status post PCI and CABG - had a myocardial perfusion imaging scan on 06/30/2016 that showed a moderately decreased ejection fraction of 30-44%- this would likely include anthracycline use. 4) history of pulmonary and renal vasculitis on chronic Imuran  5) Neutropenia -ANC <100 likely due to his chemotherapy 6) thrombocytopenia due to chemotherapy and sepsis 7) Anemia due to chemotherapy /CKD/infection. No overt evidence of bleeding  8) Neutropenia fevers due to Health Care acquired pneumonia  PLAN  -Patient is now out of the ICU. Not needing any oxygen for his pneumonia. -Would continue broad-spectrum antibiotics with gram-negative pseudomonal coverage, atypical coverage and MRSA coverage  -Daily CBC with differential -We would recommend Granix 426mcg daily until [place] more than 500 in the setting of severe neutropenia due to chemotherapy and  sepsis. -Transfuse platelets when necessary for platelets less than 30,000 in the setting of sepsis and due to the fact that the patient is on antiplatelet therapy. -Please consult with cardiology regarding antiplatelet therapy in the setting of thrombocytopenia . -Patient would lead to be in the hospital on IV antibiotics total ANC is more than1000 2 days  -we shall continue to follow. -will likely need a 50% dose reductions on his etoposide and cyclophosphamide with cycle 2 . -  I spent  30 minutes counseling the patient face to face. The total time spent in the appointment was 35 minutes and more than 50% was on counseling and direct patient cares.    Sullivan Lone MD [facility] AAHIVMS [place] [facility] Hematology/Oncology Physician [facility]  (Office):       [phone] (Work cell):  [phone] (Fax):           [phone]  09/02/2016 3:02 PM

## 2016-09-03 DIAGNOSIS — A4151 Sepsis due to Escherichia coli [E. coli]: Principal | ICD-10-CM

## 2016-09-03 DIAGNOSIS — N390 Urinary tract infection, site not specified: Secondary | ICD-10-CM

## 2016-09-03 LAB — COMPREHENSIVE METABOLIC PANEL
ALK PHOS: 39 U/L (ref 38–126)
ALT: 33 U/L (ref 17–63)
ANION GAP: 4 — AB (ref 5–15)
AST: 17 U/L (ref 15–41)
Albumin: 2.2 g/dL — ABNORMAL LOW (ref 3.5–5.0)
BUN: 29 mg/dL — ABNORMAL HIGH (ref 6–20)
CALCIUM: 7.3 mg/dL — AB (ref 8.9–10.3)
CO2: 22 mmol/L (ref 22–32)
CREATININE: 1.66 mg/dL — AB (ref 0.61–1.24)
Chloride: 110 mmol/L (ref 101–111)
GFR, EST AFRICAN AMERICAN: 44 mL/min — AB (ref >60)
GFR, EST NON AFRICAN AMERICAN: 38 mL/min — AB (ref >60)
Glucose, Bld: 83 mg/dL (ref 65–99)
Potassium: 3.7 mmol/L (ref 3.5–5.1)
Sodium: 136 mmol/L (ref 135–145)
TOTAL PROTEIN: 5.2 g/dL — AB (ref 6.5–8.1)
Total Bilirubin: 0.9 mg/dL (ref 0.3–1.2)

## 2016-09-03 LAB — CBC WITH DIFFERENTIAL/PLATELET
BASOS ABS: 0 10*3/uL (ref 0.0–0.1)
BASOS PCT: 0 %
EOS ABS: 0 10*3/uL (ref 0.0–0.7)
Eosinophils Relative: 4 %
HCT: 25 % — ABNORMAL LOW (ref 39.0–52.0)
Hemoglobin: 8.5 g/dL — ABNORMAL LOW (ref 13.0–17.0)
LYMPHS PCT: 14 %
Lymphs Abs: 0.1 10*3/uL — ABNORMAL LOW (ref 0.7–4.0)
MCH: 33.9 pg (ref 26.0–34.0)
MCHC: 34 g/dL (ref 30.0–36.0)
MCV: 99.6 fL (ref 78.0–100.0)
MONO ABS: 0.1 10*3/uL (ref 0.1–1.0)
Monocytes Relative: 12 %
NEUTROS ABS: 0.4 10*3/uL — AB (ref 1.7–7.7)
NEUTROS PCT: 70 %
PLATELETS: 30 10*3/uL — AB (ref 150–400)
RBC: 2.51 MIL/uL — ABNORMAL LOW (ref 4.22–5.81)
RDW: 18.3 % — AB (ref 11.5–15.5)
WBC: 0.6 10*3/uL — CL (ref 4.0–10.5)

## 2016-09-03 LAB — URINE CULTURE

## 2016-09-03 LAB — ABO/RH: ABO/RH(D): A POS

## 2016-09-03 LAB — RETICULOCYTES
RBC.: 2.51 MIL/uL — ABNORMAL LOW (ref 4.22–5.81)
RETIC CT PCT: 0.4 % (ref 0.4–3.1)
Retic Count, Absolute: 10 10*3/uL — ABNORMAL LOW (ref 19.0–186.0)

## 2016-09-03 MED ORDER — TBO-FILGRASTIM 480 MCG/0.8ML ~~LOC~~ SOSY
480.0000 ug | PREFILLED_SYRINGE | Freq: Once | SUBCUTANEOUS | Status: AC
  Administered 2016-09-03: 480 ug via SUBCUTANEOUS
  Filled 2016-09-03: qty 0.8

## 2016-09-03 MED ORDER — DEXTROSE 5 % IV SOLN
2.0000 g | Freq: Two times a day (BID) | INTRAVENOUS | Status: DC
  Administered 2016-09-03 – 2016-09-05 (×5): 2 g via INTRAVENOUS
  Filled 2016-09-03 (×6): qty 2

## 2016-09-03 NOTE — Progress Notes (Signed)
PROGRESS NOTE  Luke Mcbride YNW:[id number] DOB: 01/23/1936 DOA: 09/01/2016 PCP: Donetta Potts, MD  HPI/Recap of past 65 hours: 81 year old male past medical history of diffuse large B-cell lymphoma currently on chemotherapy, CAD status post CABG & chronic kidney disease stage III with history of membranoproliferative nephritis admitted on the early morning of 4/19 for sepsis and also found to be neutropenic. Patient started on IV antibiotics and brought into the hospitalist service.  By 4/19 evening, blood cultures positive for Escherichia coli. Most likely from urine although urine cultures indeterminant. 4/20, patient feeling a little bit better. Stronger.  Assessment/Plan: Principal Problem:   Neutropenia with fever (HCC)/Escherichia coli sepsis secondary to UTI: Appreciate oncology assistance. Started on Granix. Continue IV antibiotics and fluids. Lactic acid level only 0.8 to admission so sepsis looks to have already resolved. Given suppressed immune system, pro-calcitonin may not be accurate. Patient met criteria for sepsis on admission given hypotension, tachycardia, fever and urinary source. White blood cell count slowly improving. [place] at 420. Continue Granix until [place] greater than 500, continue hospitalization on IV cefepime until [place] greater than 10002 days. Will DC vancomycin. Active Problems:    Hypothyroidism: Continue Synthroid    HYPERCHOLESTEROLEMIA: Continue statin    Essential hypertension, benign    Coronary atherosclerosis: Continue Plavix BPH continue Proscar   GERD   CKD (chronic kidney disease), stage III: At baseline    Diffuse large B cell lymphoma [facility]): Being followed by oncology.   Code Status: Partial code   Family Communication: Wife and daughter at the bedside   Disposition Plan: Continues to slowly improve. Anticipate discharge likely beginning of next week.   Consultants:  Dr. Mohammed-oncology   Procedures:  None    Antimicrobials:  IV vancomycin 4/19-4/20  IV cefepime 4/19-present  DVT prophylaxis: SCDs   Objective: Vitals:   09/02/16 2221 09/03/16 0510 09/03/16 0747 09/03/16 1046  BP:  121/78  127/73  Pulse:  72  82  Resp:  14    Temp: 99 F (37.2 C) 98.4 F (36.9 C) 98.3 F (36.8 C)   TempSrc: Oral Oral    SpO2:  100%    Weight:      Height:        Intake/Output Summary (Last 24 hours) at 09/03/16 1314 Last data filed at 09/03/16 1035  Gross per 24 hour  Intake             2340 ml  Output             3700 ml  Net            -1360 ml   Filed Weights   09/01/16 2317 09/02/16 0300  Weight: 99.8 kg (220 lb) 99.1 kg (218 lb 7.6 oz)    Exam:   General:  Alert and oriented 3, no acute distress, fatigue   Cardiovascular: Regular rate and rhythm, S1-S2   Respiratory: Clear to Auscultation bilaterally although poor inspiratory effort   Abdomen: Soft, nontender, nondistended, hypoactive bowel sounds   Musculoskeletal: No clubbing or cyanosis, trace pitting edema bilaterally   Skin: No skin breaks, tears or lesions  Psychiatry: Patient is appropriate, no evidence of psychoses    Data Reviewed: CBC:  Recent Labs Lab 08/31/16 0832 09/01/16 2300 09/03/16 1035  WBC 0.4* 0.1* 0.6*  NEUTROABS 0.3* 0.0* 0.4*  HGB 10.5* 9.2* 8.5*  HCT 31.3* 27.3* 25.0*  MCV 99.7* 97.5 99.6  PLT 62* 39* 30*   Basic Metabolic Panel:  Recent Labs Lab  08/31/16 0832 09/01/16 2300 09/03/16 1035  NA 141 137 136  K 4.5 4.3 3.7  CL  --  105 110  CO2 '22 23 22  '$ GLUCOSE 132 152* 83  BUN 58.1* 45* 29*  CREATININE 2.0* 2.07* 1.66*  CALCIUM 8.5 7.8* 7.3*   GFR: Estimated Creatinine Clearance: 44.7 mL/min (A) (by C-G formula based on SCr of 1.66 mg/dL (H)). Liver Function Tests:  Recent Labs Lab 08/31/16 0832 09/01/16 2300 09/03/16 1035  AST '14 16 17  '$ ALT 40 37 33  ALKPHOS 51 40 39  BILITOT 1.13 1.8* 0.9  PROT 5.6* 5.5* 5.2*  ALBUMIN 2.9* 2.7* 2.2*   No results for  input(s): LIPASE, AMYLASE in the last 168 hours. No results for input(s): AMMONIA in the last 168 hours. Coagulation Profile:  Recent Labs Lab 09/01/16 2300  INR 1.22   Cardiac Enzymes: No results for input(s): CKTOTAL, CKMB, CKMBINDEX, TROPONINI in the last 168 hours. BNP (last 3 results) No results for input(s): PROBNP in the last 8760 hours. HbA1C: No results for input(s): HGBA1C in the last 72 hours. CBG: No results for input(s): GLUCAP in the last 168 hours. Lipid Profile: No results for input(s): CHOL, HDL, LDLCALC, TRIG, CHOLHDL, LDLDIRECT in the last 72 hours. Thyroid Function Tests: No results for input(s): TSH, T4TOTAL, FREET4, T3FREE, THYROIDAB in the last 72 hours. Anemia Panel:  Recent Labs  09/03/16 1035  RETICCTPCT 0.4   Urine analysis:    Component Value Date/Time   COLORURINE YELLOW 09/01/2016 2314   APPEARANCEUR CLEAR 09/01/2016 2314   LABSPEC 1.011 09/01/2016 2314   PHURINE 5.0 09/01/2016 2314   GLUCOSEU 50 (A) 09/01/2016 2314   GLUCOSEU NEGATIVE 11/03/2007 1116   HGBUR SMALL (A) 09/01/2016 2314   BILIRUBINUR NEGATIVE 09/01/2016 2314   KETONESUR NEGATIVE 09/01/2016 2314   PROTEINUR 30 (A) 09/01/2016 2314   UROBILINOGEN 0.2 07/17/2014 1255   NITRITE NEGATIVE 09/01/2016 2314   LEUKOCYTESUR NEGATIVE 09/01/2016 2314   Sepsis Labs: '@LABRCNTIP'$ (procalcitonin:4,lacticidven:4)  ) Recent Results (from the past 240 hour(s))  Blood Culture (routine x 2)     Status: None (Preliminary result)   Collection Time: 09/01/16 11:00 PM  Result Value Ref Range Status   Specimen Description BLOOD BLOOD LEFT FOREARM  Final   Special Requests IN PEDIATRIC BOTTLE Blood Culture adequate volume  Final   Culture  Setup Time   Final    GRAM NEGATIVE RODS IN PEDIATRIC BOTTLE CRITICAL VALUE NOTED.  VALUE IS CONSISTENT WITH PREVIOUSLY REPORTED AND CALLED VALUE.    Culture   Final    GRAM NEGATIVE RODS CULTURE REINCUBATED FOR BETTER GROWTH Performed at [facility], [place] [address] [place], [place] [address]    Report Status PENDING  Incomplete  Blood Culture (routine x 2)     Status: Abnormal (Preliminary result)   Collection Time: 09/01/16 11:07 PM  Result Value Ref Range Status   Specimen Description BLOOD PORT  Final   Special Requests   Final    BOTTLES DRAWN AEROBIC AND ANAEROBIC Blood Culture adequate volume   Culture  Setup Time   Final    GRAM NEGATIVE RODS AEROBIC BOTTLE ONLY CRITICAL RESULT CALLED TO, READ BACK BY AND VERIFIED WITH: D [facility] PHARMD 2024 09/02/16 A BROWNING    Culture (A)  Final    ESCHERICHIA COLI SUSCEPTIBILITIES TO FOLLOW Performed at [facility], [place] [address], [place], [place] [address]    Report Status PENDING  Incomplete  Blood Culture ID Panel (Reflexed)  Status: Abnormal   Collection Time: 09/01/16 11:07 PM  Result Value Ref Range Status   Enterococcus species NOT DETECTED NOT DETECTED Final   Listeria monocytogenes NOT DETECTED NOT DETECTED Final   Staphylococcus species NOT DETECTED NOT DETECTED Final   Staphylococcus aureus NOT DETECTED NOT DETECTED Final   Streptococcus species NOT DETECTED NOT DETECTED Final   Streptococcus agalactiae NOT DETECTED NOT DETECTED Final   Streptococcus pneumoniae NOT DETECTED NOT DETECTED Final   Streptococcus pyogenes NOT DETECTED NOT DETECTED Final   Acinetobacter baumannii NOT DETECTED NOT DETECTED Final   Enterobacteriaceae species DETECTED (A) NOT DETECTED Final    Comment: Enterobacteriaceae represent a large family of gram-negative bacteria, not a single organism. CRITICAL RESULT CALLED TO, READ BACK BY AND VERIFIED WITH: D [facility] PHARMD 2024 09/02/16 A BROWNING    Enterobacter cloacae complex NOT DETECTED NOT DETECTED Final   Escherichia coli DETECTED (A) NOT DETECTED Final    Comment: CRITICAL RESULT CALLED TO, READ BACK BY AND VERIFIED WITH: D [facility] PHARMD 2024 09/02/16 A BROWNING    Klebsiella oxytoca NOT DETECTED NOT DETECTED Final    Klebsiella pneumoniae NOT DETECTED NOT DETECTED Final   Proteus species NOT DETECTED NOT DETECTED Final   Serratia marcescens NOT DETECTED NOT DETECTED Final   Carbapenem resistance NOT DETECTED NOT DETECTED Final   Haemophilus influenzae NOT DETECTED NOT DETECTED Final   Neisseria meningitidis NOT DETECTED NOT DETECTED Final   Pseudomonas aeruginosa NOT DETECTED NOT DETECTED Final   Candida albicans NOT DETECTED NOT DETECTED Final   Candida glabrata NOT DETECTED NOT DETECTED Final   Candida krusei NOT DETECTED NOT DETECTED Final   Candida parapsilosis NOT DETECTED NOT DETECTED Final   Candida tropicalis NOT DETECTED NOT DETECTED Final    Comment: Performed at [facility], [place] [address] [place], [place] [address]  Urine culture     Status: Abnormal   Collection Time: 09/01/16 11:14 PM  Result Value Ref Range Status   Specimen Description URINE, RANDOM  Final   Special Requests NONE  Final   Culture MULTIPLE SPECIES PRESENT, SUGGEST RECOLLECTION (A)  Final   Report Status 09/03/2016 FINAL  Final  MRSA PCR Screening     Status: None   Collection Time: 09/02/16  2:33 AM  Result Value Ref Range Status   MRSA by PCR NEGATIVE NEGATIVE Final    Comment:        The GeneXpert MRSA Assay (FDA approved for NASAL specimens only), is one component of a comprehensive MRSA colonization surveillance program. It is not intended to diagnose MRSA infection nor to guide or monitor treatment for MRSA infections.       Studies: No results found.  Scheduled Meds: . allopurinol  100 mg Oral BID  . aspirin EC  81 mg Oral Daily  . clopidogrel  75 mg Oral Daily  . dextromethorphan-guaiFENesin  1 tablet Oral BID  . finasteride  5 mg Oral QHS  . levothyroxine  100 mcg Oral Daily  . metoprolol tartrate  12.5 mg Oral BID  . multivitamin with minerals  1 tablet Oral Daily  . pantoprazole  40 mg Oral Daily  . rosuvastatin  10 mg Oral QHS  . tamsulosin  0.4 mg Oral Daily    Continuous  Infusions: . sodium chloride 75 mL/hr at 09/03/16 1258  . azithromycin Stopped (09/03/16 0305)  . ceFEPime (MAXIPIME) IV Stopped (09/03/16 1204)     LOS: 1 day    Annita Brod, MD [facility] Pager [phone]  If 7PM-7AM, please contact night-coverage [email or web address] Password [email or web address] 09/03/2016, 1:14 PM

## 2016-09-03 NOTE — Progress Notes (Signed)
Pharmacy Antibiotic Note  Luke Mcbride is a 81 y.o. male c/o weakness, fever and uncontrolled urination admitted on 09/01/2016 with FN, sepsis  Pharmacy consulted for cefepime/vancomycin dosing on 4/18. Patient now with Ecoli bacteremia  Plan: 1) Change Cefepime from 2g IV q24 to 2g IV q12 for CrCl 30-60 ml/min 2) Continue Vancomycin 1g IV q24h  VT=15-20 mg/L 3) Recommend discontinue vancomycin as patient with Ecoli bacteremia 4) Narrow cefepime once cultures are final  Height: 6' (182.9 cm) Weight: 218 lb 7.6 oz (99.1 kg) IBW/kg (Calculated) : 77.6  Temp (24hrs), Avg:99.2 F (37.3 C), Min:98.3 F (36.8 C), Max:101 F (38.3 C)   Recent Labs Lab 08/31/16 0832 09/01/16 2300 09/01/16 2316  WBC 0.4* 0.1*  --   CREATININE 2.0* 2.07*  --   LATICACIDVEN  --   --  0.82    Estimated Creatinine Clearance: 35.9 mL/min (A) (by C-G formula based on SCr of 2.07 mg/dL (H)).    Allergies  Allergen Reactions  . No Known Allergies     Antimicrobials this admission: 4/18 cefepime >>  4/19 vancomycin  >>  4/19 Zithromax >>  Dose adjustments this admission:   Microbiology results: 4/18 BCx: 2/2 Ecoli 4/18 UCx: sent  4/19 S. Pneumo UA: negative 4/19 Legionella UA: pending 4/19 MRSA PCR: negative 4/19 Influenza PCR: negative 4/19 flu panel: negative  Thank you for allowing pharmacy to be a part of this patient's care.  Adrian Saran, PharmD, BCPS Pager [phone] 09/03/2016 9:01 AM

## 2016-09-03 NOTE — Progress Notes (Signed)
Luke Mcbride   HEMATOLOGY/ONCOLOGY INPATIENT PROGRESS NOTE  Date of Service: 09/03/2016  Inpatient Attending: .Annita Brod, MD   SUBJECTIVE  Luke Mcbride was seen this morning. Notes no issues or new symptoms overnight. Has some difficulty sleeping but finally fell sleep late last night. Had a fever up 101 weeks Fahrenheit at about 8:00 last evening but no documented fever since then. No chest pain or shortness of breath. No issues with bleeding.   OBJECTIVE:  NAD  PHYSICAL EXAMINATION: . Vitals:   09/02/16 2031 09/02/16 2221 09/03/16 0510 09/03/16 0747  BP: 128/61  121/78   Pulse: 84  72   Resp: 16  14   Temp: (!) 101 F (38.3 C) 99 F (37.2 C) 98.4 F (36.9 C) 98.3 F (36.8 C)  TempSrc: Oral Oral Oral   SpO2: 97%  100%   Weight:      Height:       Filed Weights   09/01/16 2317 09/02/16 0300  Weight: 220 lb (99.8 kg) 218 lb 7.6 oz (99.1 kg)   .Body mass index is 29.63 kg/m.  GENERAL:alert, in no acute distress and comfortable SKIN: skin color, texture, turgor are normal, no rashes or significant lesions EYES: normal, conjunctiva are pink and non-injected, sclera clear OROPHARYNX:no exudate, no erythema and lips, buccal mucosa, and tongue normal  NECK: supple, no JVD, thyroid normal size, non-tender, without nodularity LYMPH:  no palpable lymphadenopathy in the cervical, axillary or inguinal LUNGS:few basal rales. HEART: regular rate & rhythm,  no murmurs and trace lower extremity edema ABDOMEN: abdomen soft, non-tender, normoactive bowel sounds  Musculoskeletal: no cyanosis of digits and no clubbing  PSYCH: alert & oriented x 3 with fluent speech NEURO: no focal motor/sensory deficits  MEDICAL HISTORY:  Past Medical History:  Diagnosis Date  . ALLERGIC RHINITIS   . Anemia    hx  . Barrett's esophagus   . BOOP (bronchiolitis obliterans with organizing pneumonia) ([place])    a. s/p R VATS 2008.  Luke Mcbride CAD (coronary artery disease)    a. 05/2000: NSTEMI/CABG x 6:  LIMA->LAD, VG->D1, VG->OM1->2, VG->PDA->RPL;  b. 07/2007 MV: high lat infarct, no ischemia, EF 47%;  c. Cath/PCI: LM nl, LAD20p, 27m, D1 nl, D2 60-70ost, D3 nl, LCX 70ost, 170m, OM1/OM2 min irregs, RCA 30 diff, PDA 99, LIMA->LAD atretic, VG->D1 100, VG->OM1->2 100, VG->PDA->RPL 90p (4.0x23 Vision BMS);  c. 07/2012 Echo: EF 55%, gr1 DD.  Luke Mcbride Cataract   . CKD (chronic kidney disease), stage III    a. renal bx 2008: GLN with vasculitis  . Diffuse large B cell lymphoma ([place]) 08/07/2016  . GERD (gastroesophageal reflux disease)   . Glaucoma    a. Cannot see out of L eye.  . Hematuria    Microscopic  . Hyperglycemia    Patient reported while on prednisone, had to take insulin  . Hyperlipemia   . Hypertension   . Hypothyroidism   . ILD (interstitial lung disease) ([place])   . Iritis   . Local infection of skin and subcutaneous tissue   . Membranoproliferative nephritis   . Myocardial infarction ([place]) 2002  . Neutropenia, drug-induced ([place])   . Recurrent boils   . Residual foreign body in soft tissue   . Vasculitis ([place])    a.  pauciimmune vasculitis with renal involvement and hx of transient hemoptysis in the past with associated BOOP, 2008 (renal bx 2008: GLN with vasculitis). b. History of treatment with 2 cycles of Cytoxan and pheresis. H/o hemoptysis and pulm hemorrhage with  2nd cycle of cytoxan.    SURGICAL HISTORY: Past Surgical History:  Procedure Laterality Date  . BOWEL RESECTION N/A 07/12/2016   Procedure: SMALL BOWEL RESECTION;  Surgeon: Rolm Bookbinder, MD;  Location: [facility];  Service: General;  Laterality: N/A;  . CATARACT EXTRACTION Bilateral   . CORONARY ARTERY BYPASS GRAFT  2002  . IR FLUORO GUIDE PORT INSERTION RIGHT  08/20/2016  . IR US GUIDE VASC ACCESS RIGHT  08/20/2016  . LAPAROSCOPY N/A 07/12/2016   Procedure: LAPAROSCOPY DIAGNOSTIC;  Surgeon: Rolm Bookbinder, MD;  Location: [facility];  Service: General;  Laterality: N/A;  . LEFT HEART CATHETERIZATION WITH CORONARY/GRAFT  ANGIOGRAM N/A 07/26/2012   Procedure: LEFT HEART CATHETERIZATION WITH Beatrix Fetters;  Surgeon: Wellington Hampshire, MD;  Location: [facility] CATH LAB;  Service: Cardiovascular;  Laterality: N/A;  . LUNG BIOPSY    . PERCUTANEOUS CORONARY STENT INTERVENTION (PCI-S)  07/26/2012   Procedure: PERCUTANEOUS CORONARY STENT INTERVENTION (PCI-S);  Surgeon: Wellington Hampshire, MD;  Location: [facility] C[facility] LAB;  Service: Cardiovascular;;  . RENAL BIOPSY    . TOE AMPUTATION  2009   hammer toe    SOCIAL HISTORY: Social History   Social History  . Marital status: Married    Spouse name: N/A  . Number of children: N/A  . Years of education: N/A   Occupational History  . [place] History Main Topics  . Smoking status: Never Smoker  . Smokeless tobacco: Never Used  . Alcohol use No  . Drug use: No  . Sexual activity: Not on file   Other Topics Concern  . Not on file   Social History Narrative   Regular exercise - yes      [facility] (07/12/16):   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  no pets currently. No bird or mold exposure.    FAMILY HISTORY: Family History  Problem Relation Age of Onset  . Heart disease Mother   . Diabetes Mother   . Prostate cancer Father   . Depression Other   . Diabetes Other   . Prostate cancer Other   . Colon polyps Neg Hx   . Colon cancer Neg Hx   . Rectal cancer Neg Hx   . Stomach cancer Neg Hx   . Esophageal cancer Neg Hx     ALLERGIES:  is allergic to no known allergies.  MEDICATIONS:  Scheduled Meds: . allopurinol  100 mg Oral BID  . aspirin EC  81 mg Oral Daily  . clopidogrel  75 mg Oral Daily  . dextromethorphan-guaiFENesin  1 tablet Oral BID  . finasteride  5 mg Oral QHS  . levothyroxine  100 mcg Oral Daily  . metoprolol tartrate  12.5 mg Oral BID  . multivitamin with minerals  1 tablet Oral Daily  . pantoprazole  40 mg Oral Daily  . rosuvastatin  10 mg Oral QHS  . tamsulosin  0.4 mg Oral Daily    Continuous Infusions: . sodium chloride 75 mL/hr at 09/03/16 0645  . azithromycin Stopped (09/03/16 0305)  . ceFEPime (MAXIPIME) IV Stopped (09/02/16 2152)  . vancomycin Stopped (09/03/16 0450)   PRN Meds:.acetaminophen, albuterol, hydrALAZINE, loratadine, LORazepam, nitroGLYCERIN, ondansetron, phenol, polyethylene glycol, senna-docusate, zolpidem  REVIEW OF SYSTEMS:    10 Point review of Systems was done is negative except as noted above.   LABORATORY DATA:  I have reviewed the data as listed  . CBC Latest Ref Rng & Units 09/01/2016 08/31/2016 08/20/2016  WBC 4.0 - 10.5 K/uL 0.1(LL) 0.4(LL) 8.1  Hemoglobin 13.0 - 17.0 g/dL 9.2(L) 10.5(L) 10.3(L)  Hematocrit 39.0 - 52.0 % 27.3(L) 31.3(L) 31.1(L)  Platelets 150 - 400 K/uL 39(L) 62(L) 242    . CMP Latest Ref Rng & Units 09/01/2016 08/31/2016 08/20/2016  Glucose 65 - 99 mg/dL 152(H) 132 103(H)  BUN 6 - 20 mg/dL 45(H) 58.1(H) 57(H)  Creatinine 0.61 - 1.24 mg/dL 2.07(H) 2.0(H) 2.07(H)  Sodium 135 - 145 mmol/L 137 141 134(L)  Potassium 3.5 - 5.1 mmol/L 4.3 4.5 5.0  Chloride 101 - 111 mmol/L 105 - 105  CO2 22 - 32 mmol/L 23 22 21(L)  Calcium 8.9 - 10.3 mg/dL 7.8(L) 8.5 9.9  Total Protein 6.5 - 8.1 g/dL 5.5(L) 5.6(L) -  Total Bilirubin 0.3 - 1.2 mg/dL 1.8(H) 1.13 -  Alkaline Phos 38 - 126 U/L 40 51 -  AST 15 - 41 U/L 16 14 -  ALT 17 - 63 U/L 37 40 -     RADIOGRAPHIC STUDIES: I have personally reviewed the radiological images as listed and agreed with the findings in the report. Nm Pet Image Initial (pi) Skull Base To Thigh  Result Date: 08/06/2016 CLINICAL DATA:  Initial treatment strategy for diffuse large B-cell non-Hodgkin's lymphoma. EXAM: NUCLEAR MEDICINE PET SKULL BASE TO THIGH TECHNIQUE: 10.8 mCi F-18 FDG was injected intravenously. Full-ring PET imaging was performed from the skull base to thigh after the radiotracer. CT data was obtained and used for attenuation correction and anatomic localization. FASTING BLOOD GLUCOSE:   Value: 120 mg/dl COMPARISON:  CT abdomen from 06/15/2016 FINDINGS: NECK No hypermetabolic lymph nodes in the neck. CHEST Several hypermetabolic right axillary lymph nodes, including a 0.9 cm in short axis node on image 62/4 with maximum SUV 29.4. In the pericardial adipose tissue there several hypermetabolic lymph nodes including a 1.1 cm node on image 94/4 with maximum SUV 14.7. Focal hypermetabolic activity along the left ventricular myocardium is present in the anterolateral wall, probably incidental, but somewhat notably focally prominent with maximum SUV 9.8. Coronary, aortic arch, and branch vessel atherosclerotic vascular disease. Prior CABG. There is evidence of old granulomatous disease including a calcified granuloma posteriorly in the right upper lobe on image 29/8. ABDOMEN/PELVIS Innumerable hypermetabolic lesions are present throughout the liver including tracking along the liver capsule. One larger lesion in segment 4 has a maximum SUV of 28.9, and a long-axis diameter of the hypermetabolic activity at 4.9 cm. Small foci of hypermetabolic activity are present along the margin of the spleen. Hypermetabolic porta hepatis and celiac chain lymph nodes are present and there scattered hypermetabolic nodules along the upper omentum. A proximal celiac chain lymph node with short axis diameter of 1.1 cm on image 112 of series 4 has a maximum SUV of 22.5 an omental lesion measuring 8 mm in diameter anterior to the transverse colon on image 112/4 has a maximum SUV of 6.8. There are hypodense renal lesions. I am uncertain whether the focal hypermetabolic activity along the left renal collecting system is due to the adjacent hypodense lesion or collecting system activity, I favor the latter. Tiny hypermetabolic retroperitoneal lymph nodes are present. A hypermetabolic omental tumor deposit incorporated into a small Spigelian hernia on the left measures 0.9 by 1.0 cm on image 176/4 and has a maximum SUV of 15.9.  SKELETON Abnormal focal hypermetabolic activity in the anterior wall of the left acetabulum has maximum SUV of 29.4, compatible with malignancy. This is relatively inconspicuous on the CT data. A similar  lesion in the left iliac bone has a maximum SUV of 22.8. Anterolisthesis at L5-S1 with suspected pars defects at L5. Small hernias of adipose tissue are present along the midline laparotomy scar. IMPRESSION: 1. In addition to hypermetabolic nodal involvement of the right axilla, pericardial space, porta hepatis/mesenteric root, and retroperitoneum, there is extensive involvement of the liver as well as peritoneal involvement along the liver surface, splenic surface, and upper omentum. 2. Hypermetabolic bony lesions in the left pelvis compatible with malignant involvement. 3. There is a focus of hypermetabolic activity along the anterolateral wall of the left ventricle. Normally such findings turn out to simply be hypermetabolic myocardium. Strictly speaking given how focal this appears I cannot completely exclude the possibility of tumor involvement within or along the myocardium. Electronically Signed   By: Van Clines M.D.   On: 08/06/2016 12:48   Ir US Guide Vasc Access Right  Result Date: 08/20/2016 INDICATION: 80 year old male with B-cell lymphoma EXAM: IMPLANTED PORT A CATH PLACEMENT WITH ULTRASOUND AND FLUOROSCOPIC GUIDANCE MEDICATIONS: 2.0 g Ancef; The antibiotic was administered within an appropriate time interval prior to skin puncture. ANESTHESIA/SEDATION: Moderate (conscious) sedation was employed during this procedure. A total of Versed 2.0 mg and Fentanyl 50 mcg was administered intravenously. Moderate Sedation Time: 18 minutes. The patient's level of consciousness and vital signs were monitored continuously by radiology nursing throughout the procedure under my direct supervision. FLUOROSCOPY TIME:  Zero minutes, 6 seconds (1.3 mGy) COMPLICATIONS: None PROCEDURE: The procedure, risks,  benefits, and alternatives were explained to the patient. Questions regarding the procedure were encouraged and answered. The patient understands and consents to the procedure. Ultrasound survey was performed with images stored and sent to PACs. The right neck and chest was prepped with chlorhexidine, and draped in the usual sterile fashion using maximum barrier technique (cap and mask, sterile gown, sterile gloves, large sterile sheet, hand hygiene and cutaneous antiseptic). Antibiotic prophylaxis was provided with 2.0g Ancef administered IV one hour prior to skin incision. Local anesthesia was attained by infiltration with 1% lidocaine without epinephrine. Ultrasound demonstrated patency of the right internal jugular vein, and this was documented with an image. Under real-time ultrasound guidance, this vein was accessed with a 21 gauge micropuncture needle and image documentation was performed. A small dermatotomy was made at the access site with an 11 scalpel. A 0.018" wire was advanced into the SVC and used to estimate the length of the internal catheter. The access needle exchanged for a 51F micropuncture vascular sheath. The 0.018" wire was then removed and a 0.035" wire advanced into the IVC. An appropriate location for the subcutaneous reservoir was selected below the clavicle and an incision was made through the skin and underlying soft tissues. The subcutaneous tissues were then dissected using a combination of blunt and sharp surgical technique and a pocket was formed. A single lumen power injectable portacatheter was then tunneled through the subcutaneous tissues from the pocket to the dermatotomy and the port reservoir placed within the subcutaneous pocket. The venous access site was then serially dilated and a peel away vascular sheath placed over the wire. The wire was removed and the port catheter advanced into position under fluoroscopic guidance. The catheter tip is positioned in the cavoatrial  junction. This was documented with a spot image. The portacatheter was then tested and found to flush and aspirate well. The port was flushed with saline followed by 100 units/mL heparinized saline. The pocket was then closed in two layers using first subdermal inverted interrupted  absorbable sutures followed by a running subcuticular suture. The epidermis was then sealed with Dermabond. The dermatotomy at the venous access site was also seal with Dermabond. Patient tolerated the procedure well and remained hemodynamically stable throughout. No complications encountered and no significant blood loss encountered IMPRESSION: Status post right IJ port catheter placement. Catheter ready for use. Signed, Dulcy Fanny. Earleen Newport, DO [facility] Radiology Electronically Signed   By: Corrie Mckusick D.O.   On: 08/20/2016 13:54   Dg Chest Port 1 View  Result Date: 09/01/2016 CLINICAL DATA:  Initial evaluation for acute weakness, fever, urinary retention. EXAM: PORTABLE CHEST 1 VIEW COMPARISON:  Prior radiograph from 07/01/2016. FINDINGS: Right-sided Port-A-Cath in place with tip overlying the distal SVC. Median sternotomy wires underlying CABG markers and surgical clips noted. Stable mild cardiomegaly. Mediastinal silhouette normal. Aortic atherosclerosis. Lungs mildly hypoinflated. Mild patchy and hazy right basilar opacity, favored to reflect atelectasis/ bronchovascular crowding, although superimposed infiltrate could be considered in the correct clinical setting. No other focal airspace disease. No pulmonary edema or pleural effusion. No pneumothorax. No acute osseus abnormality. IMPRESSION: 1. Shallow lung inflation with patchy and hazy right basilar opacity. Atelectasis/bronchovascular crowding is favored, although mild infiltrate could be considered in the correct clinical setting. 2. Stable cardiomegaly with sequelae of prior CABG. Electronically Signed   By: Jeannine Boga M.D.   On: 09/01/2016 22:41   Ir Fluoro Guide Port Insertion Right  Result Date: 08/20/2016 INDICATION: 81 year old male with B-cell lymphoma EXAM: IMPLANTED PORT A CATH PLACEMENT WITH ULTRASOUND AND FLUOROSCOPIC GUIDANCE MEDICATIONS: 2.0 g Ancef; The antibiotic was administered within an appropriate time interval prior to skin puncture. ANESTHESIA/SEDATION: Moderate (conscious) sedation was employed during this procedure. A total of Versed 2.0 mg and Fentanyl 50 mcg was administered intravenously. Moderate Sedation Time: 18 minutes. The patient's level of consciousness and vital signs were monitored continuously by radiology nursing throughout the procedure under my direct supervision. FLUOROSCOPY TIME:  Zero minutes, 6 seconds (1.3 mGy) COMPLICATIONS: None PROCEDURE: The procedure, risks, benefits, and alternatives were explained to the patient. Questions regarding the procedure were encouraged and answered. The patient understands and consents to the procedure. Ultrasound survey was performed with images stored and sent to PACs. The right neck and chest was prepped with chlorhexidine, and draped in the usual sterile fashion using maximum barrier technique (cap and mask, sterile gown, sterile gloves, large sterile sheet, hand hygiene and cutaneous antiseptic). Antibiotic prophylaxis was provided with 2.0g Ancef administered IV one hour prior to skin incision. Local anesthesia was attained by infiltration with 1% lidocaine without epinephrine. Ultrasound demonstrated patency of the right internal jugular vein, and this was documented with an image. Under real-time ultrasound guidance, this vein was accessed with a 21 gauge micropuncture needle and image documentation was performed. A small dermatotomy was made at the access site with an 11 scalpel. A 0.018" wire was advanced into the SVC and used to estimate the length of the internal catheter. The access needle exchanged for a 13F micropuncture  vascular sheath. The 0.018" wire was then removed and a 0.035" wire advanced into the IVC. An appropriate location for the subcutaneous reservoir was selected below the clavicle and an incision was made through the skin and underlying soft tissues. The subcutaneous tissues were then dissected using a combination of blunt and sharp surgical technique and a pocket was formed. A single lumen power injectable portacatheter was then tunneled through the subcutaneous tissues from the pocket to the dermatotomy and  the port reservoir placed within the subcutaneous pocket. The venous access site was then serially dilated and a peel away vascular sheath placed over the wire. The wire was removed and the port catheter advanced into position under fluoroscopic guidance. The catheter tip is positioned in the cavoatrial junction. This was documented with a spot image. The portacatheter was then tested and found to flush and aspirate well. The port was flushed with saline followed by 100 units/mL heparinized saline. The pocket was then closed in two layers using first subdermal inverted interrupted absorbable sutures followed by a running subcuticular suture. The epidermis was then sealed with Dermabond. The dermatotomy at the venous access site was also seal with Dermabond. Patient tolerated the procedure well and remained hemodynamically stable throughout. No complications encountered and no significant blood loss encountered IMPRESSION: Status post right IJ port catheter placement. Catheter ready for use. Signed, Dulcy Fanny. Earleen Newport, DO [facility] Radiology Electronically Signed   By: Corrie Mckusick D.O.   On: 08/20/2016 13:54    ASSESSMENT & PLAN:   81 yo male with multiple medical co-morbidities including cardiac co-morbidities, IPF, vascular on chronic immunosupression with Imuran (now off)  1) Newly diagnosed Diffuse large B-cell lymphoma involving the small intestine  (germinal cell phenotype) with complete thickness involvement of bowel and abutting serosa.  PET/CT scan shows extensive involvement with DLBCL, hypermetabolic nodal involvement of the right axilla, pericardial space, porta hepatis/mesenteric root, and retroperitoneum, there is extensive involvement of the liver as well as peritoneal involvement along the liver surface, splenic surface, and upper omentum. 2. Hypermetabolic bony lesions in the left pelvis compatible with malignant involvement. 3. There is a focus of hypermetabolic activity along the anterolateral wall of the left ventricle.   Patient is s/p 1st cycle of R-CEOP and received neulasta on 4/12 2) chronic kidney disease stage III baseline creatinine about 2 3) coronary disease status post PCI and CABG - had a myocardial perfusion imaging scan on 06/30/2016 that showed a moderately decreased ejection fraction of 30-44%- this would likely include anthracycline use. 4) history of pulmonary and renal vasculitis on chronic Imuran  5) Neutropenia -ANC <100 likely due to his chemotherapy 6) thrombocytopenia due to chemotherapy and sepsis 7) Anemia due to chemotherapy /CKD/infection. No overt evidence of bleeding  8) Neutropenia fevers due to Health Care acquired pneumonia  PLAN  -labs pending this AM. Ordered stat -Would continue broad-spectrum antibiotics with gram-negative pseudomonal coverage, atypical coverage and MRSA coverage  -Daily CBC with differential -We would recommend Granix 466mcg daily until [place] more than 500 in the setting of severe neutropenia due to chemotherapy and sepsis. -Transfuse platelets when necessary for platelets less than 30,000 in the setting of sepsis and due to the fact that the patient is on antiplatelet therapy. -transfuse PRBC for hgb<8 or if symptomatic -Please consult with cardiology regarding antiplatelet therapy in the setting of thrombocytopenia . -Patient would lead to be in the hospital on IV  antibiotics total ANC is more than 1000 2 days  -we shall continue to follow. -will likely need a 50% dose reductions on his etoposide and cyclophosphamide with cycle 2 given significant cytopenias, limited BM reserve, CKD and multiple medical co-morbidities.-  I spent 20 minutes counseling the patient face to face. The total time spent in the appointment was 25 minutes and more than 50% was on counseling and direct patient cares.    Sullivan Lone MD MS AAHIVMS [facility] Hematology/Oncology Physician [facility]  (  Office):       [phone] (Work cell):  [phone] (Fax):           [phone]  09/03/2016 8:35 AM

## 2016-09-04 DIAGNOSIS — I10 Essential (primary) hypertension: Secondary | ICD-10-CM

## 2016-09-04 LAB — CBC WITH DIFFERENTIAL/PLATELET
BASOS ABS: 0 10*3/uL (ref 0.0–0.1)
BASOS PCT: 0 %
EOS ABS: 0 10*3/uL (ref 0.0–0.7)
Eosinophils Relative: 2 %
HCT: 24 % — ABNORMAL LOW (ref 39.0–52.0)
Hemoglobin: 8 g/dL — ABNORMAL LOW (ref 13.0–17.0)
LYMPHS PCT: 6 %
Lymphs Abs: 0.1 10*3/uL — ABNORMAL LOW (ref 0.7–4.0)
MCH: 32.5 pg (ref 26.0–34.0)
MCHC: 33.3 g/dL (ref 30.0–36.0)
MCV: 97.6 fL (ref 78.0–100.0)
MONO ABS: 0.1 10*3/uL (ref 0.1–1.0)
Monocytes Relative: 10 %
NEUTROS PCT: 82 %
Neutro Abs: 1.1 10*3/uL — ABNORMAL LOW (ref 1.7–7.7)
PLATELETS: 36 10*3/uL — AB (ref 150–400)
RBC: 2.46 MIL/uL — AB (ref 4.22–5.81)
RDW: 18.1 % — AB (ref 11.5–15.5)
WBC: 1.3 10*3/uL — AB (ref 4.0–10.5)

## 2016-09-04 LAB — LEGIONELLA PNEUMOPHILA SEROGP 1 UR AG: L. pneumophila Serogp 1 Ur Ag: NEGATIVE

## 2016-09-04 LAB — CULTURE, BLOOD (ROUTINE X 2): Special Requests: ADEQUATE

## 2016-09-04 LAB — BASIC METABOLIC PANEL
ANION GAP: 6 (ref 5–15)
BUN: 26 mg/dL — ABNORMAL HIGH (ref 6–20)
CALCIUM: 7.4 mg/dL — AB (ref 8.9–10.3)
CO2: 21 mmol/L — AB (ref 22–32)
Chloride: 109 mmol/L (ref 101–111)
Creatinine, Ser: 1.66 mg/dL — ABNORMAL HIGH (ref 0.61–1.24)
GFR, EST AFRICAN AMERICAN: 44 mL/min — AB (ref >60)
GFR, EST NON AFRICAN AMERICAN: 38 mL/min — AB (ref >60)
Glucose, Bld: 96 mg/dL (ref 65–99)
Potassium: 4.1 mmol/L (ref 3.5–5.1)
SODIUM: 136 mmol/L (ref 135–145)

## 2016-09-04 NOTE — Evaluation (Signed)
Physical Therapy Evaluation Patient Details Name: Luke Mcbride MRN: [id number] DOB: September 06, 1935 Today's Date: 09/04/2016   History of Present Illness  Pt admitted with sepsis, urinary retention, and neutropenia.  Pt with recent dx of Non-Hodgkins Lymphoma and recently started chemo.  Pt also with hx of MI, CKD, CAD and CABG  Clinical Impression  Pt admitted as above and presenting with functional mobility limitations 2* generalized weakness and ambulatory balance deficits.  Pt hopes to progress to dc home with family assist.    Follow Up Recommendations Home health PT    Equipment Recommendations  None recommended by PT    Recommendations for Other Services OT consult     Precautions / Restrictions Precautions Precautions: Fall Restrictions Weight Bearing Restrictions: No      Mobility  Bed Mobility               General bed mobility comments: NT - OOB with nursing and requests up in chair  Transfers Overall transfer level: Needs assistance Equipment used: Rolling walker (2 wheeled) Transfers: Sit to/from Stand Sit to Stand: Min assist         General transfer comment: cues for use of UEs to self assist.  Physical assist to bring wt up and fwd  Ambulation/Gait Ambulation/Gait assistance: Min assist Ambulation Distance (Feet): 20 Feet Assistive device: Rolling walker (2 wheeled) Gait Pattern/deviations: Step-to pattern;Step-through pattern;Decreased step length - right;Decreased step length - left;Shuffle;Trunk flexed Gait velocity: decr Gait velocity interpretation: Below normal speed for age/gender General Gait Details: cues for posture and position from RW; Distance ltd by pt fatigue after using bathroom  Stairs            Wheelchair Mobility    Modified Rankin (Stroke Patients Only)       Balance Overall balance assessment: Needs assistance Sitting-balance support: Bilateral upper extremity supported;Feet supported Sitting balance-Leahy  Scale: Good     Standing balance support: No upper extremity supported Standing balance-Leahy Scale: Fair                               Pertinent Vitals/Pain Pain Assessment: No/denies pain    Home Living Family/patient expects to be discharged to:: Private residence Living Arrangements: Spouse/significant other Available Help at Discharge: Family;Available 24 hours/day Type of Home: House Home Access: Stairs to enter Entrance Stairs-Rails: None Entrance Stairs-Number of Steps: 1 Home Layout: Laundry or work area in basement;Two level Home Equipment: None Additional Comments: Wife states she can get equipment if needed    Prior Function Level of Independence: Independent               Hand Dominance        Extremity/Trunk Assessment   Upper Extremity Assessment Upper Extremity Assessment: Generalized weakness    Lower Extremity Assessment Lower Extremity Assessment: Generalized weakness    Cervical / Trunk Assessment Cervical / Trunk Assessment: Kyphotic  Communication   Communication: No difficulties  Cognition Arousal/Alertness: Awake/alert Behavior During Therapy: WFL for tasks assessed/performed Overall Cognitive Status: Within Functional Limits for tasks assessed                                        General Comments      Exercises     Assessment/Plan    PT Assessment Patient needs continued PT services  PT Problem List Decreased strength;Decreased activity  tolerance;Decreased balance;Decreased mobility;Decreased knowledge of use of DME       PT Treatment Interventions DME instruction;Gait training;Stair training;Functional mobility training;Therapeutic activities;Therapeutic exercise;Balance training;Patient/family education    PT Goals (Current goals can be found in the Care Plan section)  Acute Rehab PT Goals Patient Stated Goal: Regain strength for home PT Goal Formulation: With patient Time For Goal  Achievement: 09/18/16 Potential to Achieve Goals: Fair    Frequency Min 3X/week   Barriers to discharge        Co-evaluation               End of Session Equipment Utilized During Treatment: Gait belt Activity Tolerance: Patient limited by fatigue Patient left: in chair;with call bell/phone within reach;with family/visitor present Nurse Communication: Mobility status PT Visit Diagnosis: Unsteadiness on feet (R26.81);Muscle weakness (generalized) (M62.81);Difficulty in walking, not elsewhere classified (R26.2)    Time: 7035-0093 PT Time Calculation (min) (ACUTE ONLY): 16 min   Charges:   PT Evaluation $PT Eval Low Complexity: 1 Procedure     PT G Codes:        Pg [phone]   [name] 09/04/2016, 1:01 PM

## 2016-09-04 NOTE — Progress Notes (Signed)
PROGRESS NOTE  Luke Mcbride RPR:[id number] DOB: 1935-12-21 DOA: 09/01/2016 PCP: Donetta Potts, MD  HPI/Recap of past 46 hours: 81 year old male past medical history of diffuse large B-cell lymphoma currently on chemotherapy, CAD status post CABG & chronic kidney disease stage III with history of membranoproliferative nephritis admitted on the early morning of 4/19 for sepsis and also found to be neutropenic. Patient started on IV antibiotics and brought into the hospitalist service.  By 4/19 evening, blood cultures positive for Escherichia coli, pansensitive. Most likely from urine although urine cultures indeterminant. Patient continues to improve. Absolute neutrophil count today greater than 1000. Patient states he feels a little bit stronger.  Only complains of a minor nonproductive cough.  Assessment/Plan: Principal Problem:   Neutropenia with fever (HCC)/Escherichia coli sepsis secondary to UTI: Appreciate oncology assistance. Started on Granix. Continue IV antibiotics and fluids. Lactic acid level only 0.8 to admission so sepsis looks to have already resolved. Given suppressed immune system, pro-calcitonin may not be accurate. Patient met criteria for sepsis on admission given hypotension, tachycardia, fever and urinary source. White blood cell count slowly improving. ANC today greater than 1000. Can DC Granix. Active Problems:    Hypothyroidism: Continue Synthroid    HYPERCHOLESTEROLEMIA: Continue statin    Essential hypertension, benign    Coronary atherosclerosis: Continue Plavix BPH continue Proscar   GERD   CKD (chronic kidney disease), stage III: At baseline    Diffuse large B cell lymphoma [facility]): Being followed by oncology.  Code Status: Partial code   Family Communication: Wife at bedside  Disposition Plan: Potential discharge tomorrow as long as Blackgum count greater than 1000   Consultants:  Dr. Mohammed-oncology   Procedures:  None    Antimicrobials:  IV vancomycin 4/19-4/20  IV cefepime 4/19-present  DVT prophylaxis: SCDs   Objective: Vitals:   09/03/16 1339 09/03/16 2016 09/04/16 0402 09/04/16 0441  BP: 117/67 133/80  114/71  Pulse: 93 91  85  Resp: _0 Temp: 98.7 F (37.1 C) 99.6 F (37.6 C) (!) 100.6 F (38.1 C) 98.6 F (37 C)  TempSrc: Oral Oral Axillary Oral  SpO2: 99% 96%  98%  Weight:      Height:        Intake/Output Summary (Last 24 hours) at 09/04/16 1347 Last data filed at 09/04/16 1106  Gross per 24 hour  Intake          1088.75 ml  Output             1800 ml  Net          -711.25 ml   Filed Weights   09/01/16 2317 09/02/16 0300  Weight: 99.8 kg (220 lb) 99.1 kg (218 lb 7.6 oz)    Exam:   General:  Alert and oriented 3, no acute distress, more awake  Cardiovascular: Regular rate and rhythm, S1-S2   Respiratory: Clear to Auscultation bilaterally   Abdomen: Soft, nontender, nondistended, hypoactive bowel sounds   Musculoskeletal: No clubbing or cyanosis, trace pitting edema bilaterally   Skin: No skin breaks, tears or lesions  Psychiatry: Patient is appropriate, no evidence of psychoses    Data Reviewed: CBC:  Recent Labs Lab 08/31/16 0832 09/01/16 2300 09/03/16 1035 09/04/16 0500  WBC 0.4* 0.1* 0.6* 1.3*  NEUTROABS 0.3* 0.0* 0.4* 1.1*  HGB 10.5* 9.2* 8.5* 8.0*  HCT 31.3* 27.3* 25.0* 24.0*  MCV 99.7* 97.5 99.6 97.6  PLT 62* 39* 30* 36*   Basic Metabolic Panel:  Recent  Labs Lab 08/31/16 0832 09/01/16 2300 09/03/16 1035 09/04/16 0500  NA 141 137 136 136  K 4.5 4.3 3.7 4.1  CL  --  105 110 109  CO2 _0 21*  GLUCOSE 132 152* 83 96  BUN 58.1* 45* 29* 26*  CREATININE 2.0* 2.07* 1.66* 1.66*  CALCIUM 8.5 7.8* 7.3* 7.4*   GFR: Estimated Creatinine Clearance: 44.7 mL/min (A) (by C-G formula based on SCr of 1.66 mg/dL (H)). Liver Function Tests:  Recent Labs Lab 08/31/16 0832 09/01/16 2300 09/03/16 1035  AST _1 ALT 40 37 33   ALKPHOS 51 40 39  BILITOT 1.13 1.8* 0.9  PROT 5.6* 5.5* 5.2*  ALBUMIN 2.9* 2.7* 2.2*   No results for input(s): LIPASE, AMYLASE in the last 168 hours. No results for input(s): AMMONIA in the last 168 hours. Coagulation Profile:  Recent Labs Lab 09/01/16 2300  INR 1.22   Cardiac Enzymes: No results for input(s): CKTOTAL, CKMB, CKMBINDEX, TROPONINI in the last 168 hours. BNP (last 3 results) No results for input(s): PROBNP in the last 8760 hours. HbA1C: No results for input(s): HGBA1C in the last 72 hours. CBG: No results for input(s): GLUCAP in the last 168 hours. Lipid Profile: No results for input(s): CHOL, HDL, LDLCALC, TRIG, CHOLHDL, LDLDIRECT in the last 72 hours. Thyroid Function Tests: No results for input(s): TSH, T4TOTAL, FREET4, T3FREE, THYROIDAB in the last 72 hours. Anemia Panel:  Recent Labs  09/03/16 1035  RETICCTPCT 0.4   Urine analysis:    Component Value Date/Time   COLORURINE YELLOW 09/01/2016 2314   APPEARANCEUR CLEAR 09/01/2016 2314   LABSPEC 1.011 09/01/2016 2314   PHURINE 5.0 09/01/2016 2314   GLUCOSEU 50 (A) 09/01/2016 2314   GLUCOSEU NEGATIVE 11/03/2007 1116   HGBUR SMALL (A) 09/01/2016 2314   BILIRUBINUR NEGATIVE 09/01/2016 2314   KETONESUR NEGATIVE 09/01/2016 2314   PROTEINUR 30 (A) 09/01/2016 2314   UROBILINOGEN 0.2 07/17/2014 1255   NITRITE NEGATIVE 09/01/2016 2314   LEUKOCYTESUR NEGATIVE 09/01/2016 2314   Sepsis Labs: _2 (procalcitonin:4,lacticidven:4)  ) Recent Results (from the past 240 hour(s))  Blood Culture (routine x 2)     Status: Abnormal (Preliminary result)   Collection Time: 09/01/16 11:00 PM  Result Value Ref Range Status   Specimen Description BLOOD BLOOD LEFT FOREARM  Final   Special Requests IN PEDIATRIC BOTTLE Blood Culture adequate volume  Final   Culture  Setup Time   Final    GRAM NEGATIVE RODS IN PEDIATRIC BOTTLE CRITICAL VALUE NOTED.  VALUE IS CONSISTENT WITH PREVIOUSLY REPORTED AND CALLED  VALUE.    Culture (A)  Final    PSEUDOMONAS AERUGINOSA SUSCEPTIBILITIES TO FOLLOW CRITICAL RESULT CALLED TO, READ BACK BY AND VERIFIED WITH: T GREEN,PHARMD AT 1505 09/03/16 BY L BENFIELD CONCERNING DIFFERENT ORGANISM THAN OTHER BLOOD CULTURE SET Performed at [facility], [place] [address], [place], [place] [address]    Report Status PENDING  Incomplete  Blood Culture (routine x 2)     Status: Abnormal   Collection Time: 09/01/16 11:07 PM  Result Value Ref Range Status   Specimen Description BLOOD PORT  Final   Special Requests   Final    BOTTLES DRAWN AEROBIC AND ANAEROBIC Blood Culture adequate volume   Culture  Setup Time   Final    GRAM NEGATIVE RODS AEROBIC BOTTLE ONLY CRITICAL RESULT CALLED TO, READ BACK BY AND VERIFIED WITHKeturah Barre [facility] PHARMD 2024 09/02/16 A BROWNING Performed at [facility], [place]  [address], [place], [place] [address]    Culture ESCHERICHIA COLI (A)  Final   Report Status 09/04/2016 FINAL  Final   Organism ID, Bacteria ESCHERICHIA COLI  Final      Susceptibility   Escherichia coli - MIC*    AMPICILLIN >=32 RESISTANT Resistant     CEFAZOLIN <=4 SENSITIVE Sensitive     CEFEPIME <=1 SENSITIVE Sensitive     CEFTAZIDIME <=1 SENSITIVE Sensitive     CEFTRIAXONE <=1 SENSITIVE Sensitive     CIPROFLOXACIN <=0.25 SENSITIVE Sensitive     GENTAMICIN <=1 SENSITIVE Sensitive     IMIPENEM <=0.25 SENSITIVE Sensitive     TRIMETH/SULFA <=20 SENSITIVE Sensitive     AMPICILLIN/SULBACTAM 16 INTERMEDIATE Intermediate     PIP/TAZO <=4 SENSITIVE Sensitive     Extended ESBL NEGATIVE Sensitive     * ESCHERICHIA COLI  Blood Culture ID Panel (Reflexed)     Status: Abnormal   Collection Time: 09/01/16 11:07 PM  Result Value Ref Range Status   Enterococcus species NOT DETECTED NOT DETECTED Final   Listeria monocytogenes NOT DETECTED NOT DETECTED Final   Staphylococcus species NOT DETECTED NOT DETECTED Final   Staphylococcus aureus NOT DETECTED NOT DETECTED Final    Streptococcus species NOT DETECTED NOT DETECTED Final   Streptococcus agalactiae NOT DETECTED NOT DETECTED Final   Streptococcus pneumoniae NOT DETECTED NOT DETECTED Final   Streptococcus pyogenes NOT DETECTED NOT DETECTED Final   Acinetobacter baumannii NOT DETECTED NOT DETECTED Final   Enterobacteriaceae species DETECTED (A) NOT DETECTED Final    Comment: Enterobacteriaceae represent a large family of gram-negative bacteria, not a single organism. CRITICAL RESULT CALLED TO, READ BACK BY AND VERIFIED WITH: D [facility] PHARMD 2024 09/02/16 A BROWNING    Enterobacter cloacae complex NOT DETECTED NOT DETECTED Final   Escherichia coli DETECTED (A) NOT DETECTED Final    Comment: CRITICAL RESULT CALLED TO, READ BACK BY AND VERIFIED WITH: D [facility] PHARMD 2024 09/02/16 A BROWNING    Klebsiella oxytoca NOT DETECTED NOT DETECTED Final   Klebsiella pneumoniae NOT DETECTED NOT DETECTED Final   Proteus species NOT DETECTED NOT DETECTED Final   Serratia marcescens NOT DETECTED NOT DETECTED Final   Carbapenem resistance NOT DETECTED NOT DETECTED Final   Haemophilus influenzae NOT DETECTED NOT DETECTED Final   Neisseria meningitidis NOT DETECTED NOT DETECTED Final   Pseudomonas aeruginosa NOT DETECTED NOT DETECTED Final   Candida albicans NOT DETECTED NOT DETECTED Final   Candida glabrata NOT DETECTED NOT DETECTED Final   Candida krusei NOT DETECTED NOT DETECTED Final   Candida parapsilosis NOT DETECTED NOT DETECTED Final   Candida tropicalis NOT DETECTED NOT DETECTED Final    Comment: Performed at [facility], [place] [address] [place], [place] [address]  Urine culture     Status: Abnormal   Collection Time: 09/01/16 11:14 PM  Result Value Ref Range Status   Specimen Description URINE, RANDOM  Final   Special Requests NONE  Final   Culture MULTIPLE SPECIES PRESENT, SUGGEST RECOLLECTION (A)  Final   Report Status 09/03/2016 FINAL  Final  MRSA PCR Screening     Status: None   Collection Time:  09/02/16  2:33 AM  Result Value Ref Range Status   MRSA by PCR NEGATIVE NEGATIVE Final    Comment:        The GeneXpert MRSA Assay (FDA approved for NASAL specimens only), is one component of a comprehensive MRSA colonization surveillance program. It is not intended to diagnose MRSA infection nor to guide  or monitor treatment for MRSA infections.       Studies: No results found.  Scheduled Meds: . allopurinol  100 mg Oral BID  . aspirin EC  81 mg Oral Daily  . clopidogrel  75 mg Oral Daily  . dextromethorphan-guaiFENesin  1 tablet Oral BID  . finasteride  5 mg Oral QHS  . levothyroxine  100 mcg Oral Daily  . metoprolol tartrate  12.5 mg Oral BID  . multivitamin with minerals  1 tablet Oral Daily  . pantoprazole  40 mg Oral Daily  . rosuvastatin  10 mg Oral QHS  . tamsulosin  0.4 mg Oral Daily    Continuous Infusions: . sodium chloride 75 mL/hr at 09/04/16 0233  . azithromycin 500 mg (09/04/16 0234)  . ceFEPime (MAXIPIME) IV 2 g (09/04/16 1057)     LOS: 2 days    Annita Brod, MD [facility] Pager [phone]  If 7PM-7AM, please contact night-coverage [email or web address] Password [email or web address] 09/04/2016, 1:47 PM

## 2016-09-05 DIAGNOSIS — A4152 Sepsis due to Pseudomonas: Secondary | ICD-10-CM

## 2016-09-05 DIAGNOSIS — T451X5A Adverse effect of antineoplastic and immunosuppressive drugs, initial encounter: Secondary | ICD-10-CM

## 2016-09-05 LAB — CULTURE, BLOOD (ROUTINE X 2): SPECIAL REQUESTS: ADEQUATE

## 2016-09-05 LAB — CBC WITH DIFFERENTIAL/PLATELET
Basophils Absolute: 0 10*3/uL (ref 0.0–0.1)
Basophils Relative: 0 %
EOS PCT: 1 %
Eosinophils Absolute: 0 10*3/uL (ref 0.0–0.7)
HEMATOCRIT: 23.4 % — AB (ref 39.0–52.0)
HEMOGLOBIN: 7.9 g/dL — AB (ref 13.0–17.0)
Lymphocytes Relative: 9 %
Lymphs Abs: 0.2 10*3/uL — ABNORMAL LOW (ref 0.7–4.0)
MCH: 33.5 pg (ref 26.0–34.0)
MCHC: 33.8 g/dL (ref 30.0–36.0)
MCV: 99.2 fL (ref 78.0–100.0)
MONO ABS: 0.1 10*3/uL (ref 0.1–1.0)
MONOS PCT: 5 %
NEUTROS ABS: 2.1 10*3/uL (ref 1.7–7.7)
Neutrophils Relative %: 85 %
Platelets: 42 10*3/uL — ABNORMAL LOW (ref 150–400)
RBC: 2.36 MIL/uL — ABNORMAL LOW (ref 4.22–5.81)
RDW: 18.3 % — AB (ref 11.5–15.5)
WBC Morphology: INCREASED
WBC: 2.4 10*3/uL — ABNORMAL LOW (ref 4.0–10.5)

## 2016-09-05 MED ORDER — HEPARIN SOD (PORK) LOCK FLUSH 100 UNIT/ML IV SOLN
500.0000 [IU] | Freq: Once | INTRAVENOUS | Status: DC
  Filled 2016-09-05: qty 5

## 2016-09-05 MED ORDER — KETOROLAC TROMETHAMINE 10 MG PO TABS
10.0000 mg | ORAL_TABLET | Freq: Once | ORAL | Status: AC
  Administered 2016-09-05: 10 mg via ORAL
  Filled 2016-09-05: qty 1

## 2016-09-05 MED ORDER — TRIAMCINOLONE ACETONIDE 0.025 % EX CREA
TOPICAL_CREAM | Freq: Two times a day (BID) | CUTANEOUS | Status: DC
  Filled 2016-09-05: qty 15

## 2016-09-05 MED ORDER — CIPROFLOXACIN HCL 500 MG PO TABS: 500.0000 mg | ORAL_TABLET | Freq: Two times a day (BID) | ORAL | 0 refills | Status: DC

## 2016-09-05 MED ORDER — TRIAMCINOLONE ACETONIDE 0.025 % EX CREA: TOPICAL_CREAM | Freq: Two times a day (BID) | CUTANEOUS | 0 refills | Status: DC

## 2016-09-05 NOTE — Care Management Important Message (Signed)
Important Message  Patient Details  Name: Luke Mcbride MRN: [id number] Date of Birth: 05-27-1935   Medicare Important Message Given:  Yes    Erenest Rasher, RN 09/05/2016, 2:21 PM

## 2016-09-05 NOTE — Progress Notes (Signed)
Patient discharged to home with family, discharge instructions reviewed with patient and wife who verbalized understanding. New RX's sent to pharmacy.

## 2016-09-05 NOTE — Discharge Summary (Signed)
Discharge Summary  Luke Mcbride QQI:[id number] DOB: 1935-06-20  PCP: Donetta Potts, MD  Admit date: 09/01/2016 Discharge date: 09/05/2016  Time spent: 25 minutes   Recommendations for Outpatient Follow-up:  1. New medication: Cipro 500 mg by mouth twice a day 7 days 2. New medication: Triamcinolone cream 0.025% apply topically to back twice a day for rash 3. Patient will be going home with home health PT and rolling walker   Discharge Diagnoses:  Active Hospital Problems   Diagnosis Date Noted  . Neutropenia with fever ([place]) 09/02/2016  . Acute lower UTI 09/02/2016  . Pancytopenia ([place]) 09/02/2016  . Sepsis ([place]) 09/02/2016  . Diffuse large B cell lymphoma ([place]) 08/07/2016  . CKD (chronic kidney disease), stage III 07/29/2012  . HYPERCHOLESTEROLEMIA 12/04/2008  . Coronary atherosclerosis 07/23/2008  . GERD 04/15/2008  . Essential hypertension, benign 05/23/2007  . Hypothyroidism 11/30/2006    Resolved Hospital Problems   Diagnosis Date Noted Date Resolved  No resolved problems to display.    Discharge Condition: Improved, being discharged home   Diet recommendation: Low-sodium diet   Vitals:   09/04/16 2032 09/05/16 0555  BP: 104/64 118/71  Pulse: 86 84  Resp:  20  Temp: 98.9 F (37.2 C) 99 F (37.2 C)    History of present illness:  81 year old male past medical history of diffuse large B-cell lymphoma currently on chemotherapy, CAD status post CABG & chronic kidney disease stage III with history of membranoproliferative nephritis admitted on the early morning of 4/19 for sepsis and also found to be neutropenic. Patient started on IV antibiotics and brought into the hospitalist service.   Hospital Course:  Principal Problem:   Neutropenia with fever (HCC)/Escherichia coli sepsis secondary to UTI: Appreciate oncology assistance. Started on Granix. Continue IV antibiotics and fluids. Given suppressed immune system, pro-calcitonin may not be accurate.  Patient met criteria for sepsis on admission given hypotension, tachycardia, fever and urinary source. Sepsis resolved soon after admission. Blood cultures positive for Pseudomonas and Escherichia coli, both sensitive to Cipro.. ANC counts were greater than 1000 on 4/21 and 4/22. At this point, patient's immune system felt to be stable, sepsis resolved and patient okay to go home. He will be discharged on 7 more days of Cipro for a total of 10 days of antibiotics. Active Problems:    Hypothyroidism: Continued on Synthroid  Contact dermatitis: Patient had an itchy rash involving back on 4/22 morning. Says he was sweating a lot the night before. No fever. Ordered steroid cream of which he will go home on.    HYPERCHOLESTEROLEMIA: Continue statin    Essential hypertension, benign    Coronary atherosclerosis: Continue Plavix BPH continue Proscar   GERD   CKD (chronic kidney disease), stage III: At baseline    Diffuse large B cell lymphoma [facility]): Being followed by oncology.  Procedures:  None   Consultations:  Oncology   Discharge Exam: BP 118/71 (BP Location: Left Arm)   Pulse 84   Temp 99 F (37.2 C) (Oral)   Resp 20   Ht 6' (1.829 m)   Wt 99.1 kg (218 lb 7.6 oz)   SpO2 96%   BMI 29.63 kg/m   General: Alert and oriented 3, no acute distress  Cardiovascular: Regular rate and rhythm, S1-S2  Respiratory: Clear to auscultation bilaterally   Discharge Instructions You were cared for by a hospitalist during your hospital stay. If you have any questions about your discharge medications or the care you received while you were  in the hospital after you are discharged, you can call the unit and asked to speak with the hospitalist on call if the hospitalist that took care of you is not available. Once you are discharged, your primary care physician will handle any further medical issues. Please note that NO REFILLS for any discharge medications will be authorized once you are  discharged, as it is imperative that you return to your primary care physician (or establish a relationship with a primary care physician if you do not have one) for your aftercare needs so that they can reassess your need for medications and monitor your lab values.  Discharge Instructions    Diet - low sodium heart healthy    Complete by:  As directed    Increase activity slowly    Complete by:  As directed      Allergies as of 09/05/2016      Reactions   No Known Allergies       Medication List    TAKE these medications   acetaminophen 500 MG tablet Commonly known as:  TYLENOL Take 500-1,000 mg by mouth daily as needed for moderate pain or headache.   allopurinol 100 MG tablet Commonly known as:  ZYLOPRIM Take 1 tablet (100 mg total) by mouth 2 (two) times daily. Start taking after stopping Imuran   aspirin 81 MG EC tablet Take 1 tablet (81 mg total) by mouth daily.   ciprofloxacin 500 MG tablet Commonly known as:  CIPRO Take 1 tablet (500 mg total) by mouth 2 (two) times daily.   clopidogrel 75 MG tablet Commonly known as:  PLAVIX Take 1 tablet (75 mg total) by mouth daily.   dexamethasone 4 MG tablet Commonly known as:  DECADRON Take 1 tablet (4 mg total) by mouth 2 (two) times daily with a meal.   finasteride 5 MG tablet Commonly known as:  PROSCAR Take 5 mg by mouth at bedtime.   FLOMAX 0.4 MG Caps capsule Generic drug:  tamsulosin Take 0.4 mg by mouth at bedtime.   levothyroxine 100 MCG tablet Commonly known as:  SYNTHROID, LEVOTHROID Take 100 mcg by mouth daily.   lidocaine-prilocaine cream Commonly known as:  EMLA Apply to affected area once   LORazepam 0.5 MG tablet Commonly known as:  ATIVAN Take 1 tablet (0.5 mg total) by mouth every 8 (eight) hours as needed for anxiety or sleep.   metoprolol tartrate 25 MG tablet Commonly known as:  LOPRESSOR Take 0.5 tablets (12.5 mg total) by mouth 2 (two) times daily.   multivitamin tablet Take 1  tablet by mouth daily.   NITROSTAT 0.4 MG SL tablet Generic drug:  nitroGLYCERIN PLACE 1 TABLET (0.4 MG TOTAL) UNDER THE TONGUE EVERY 5 (FIVE) MINUTES AS NEEDED. MAY REPEAT X3 What changed:  reasons to take this  additional instructions   ondansetron 8 MG tablet Commonly known as:  ZOFRAN Take 1 tablet (8 mg total) by mouth 2 (two) times daily as needed for refractory nausea / vomiting. Start on day 3 after cyclophosphamide.   pantoprazole 40 MG tablet Commonly known as:  PROTONIX TAKE 1 TABLET (40 MG TOTAL) BY MOUTH DAILY.   predniSONE 20 MG tablet Commonly known as:  DELTASONE Take 3 tablets (60 mg total) by mouth daily. Take on days 1-5 of chemotherapy.   prochlorperazine 10 MG tablet Commonly known as:  COMPAZINE Take 1 tablet (10 mg total) by mouth every 6 (six) hours as needed (Nausea or vomiting).   rosuvastatin 10 MG tablet  Commonly known as:  CRESTOR Take 10 mg by mouth at bedtime.   telmisartan 40 MG tablet Commonly known as:  MICARDIS Take 20 mg by mouth 2 (two) times daily.   triamcinolone 0.025 % cream Commonly known as:  KENALOG Apply topically 2 (two) times daily.            Durable Medical Equipment        Start     Ordered   09/05/16 0919  For home use only DME Walker rolling  Once    Question:  Patient needs a walker to treat with the following condition  Answer:  Sepsis ([place])   09/05/16 0919     Allergies  Allergen Reactions  . No Known Allergies       The results of significant diagnostics from this hospitalization (including imaging, microbiology, ancillary and laboratory) are listed below for reference.    Significant Diagnostic Studies: Ir US Guide Vasc Access Right  Result Date: 08/20/2016 INDICATION: 81 year old male with B-cell lymphoma EXAM: IMPLANTED PORT A CATH PLACEMENT WITH ULTRASOUND AND FLUOROSCOPIC GUIDANCE MEDICATIONS: 2.0 g Ancef; The antibiotic was administered within an appropriate time interval prior to skin  puncture. ANESTHESIA/SEDATION: Moderate (conscious) sedation was employed during this procedure. A total of Versed 2.0 mg and Fentanyl 50 mcg was administered intravenously. Moderate Sedation Time: 18 minutes. The patient's level of consciousness and vital signs were monitored continuously by radiology nursing throughout the procedure under my direct supervision. FLUOROSCOPY TIME:  Zero minutes, 6 seconds (1.3 mGy) COMPLICATIONS: None PROCEDURE: The procedure, risks, benefits, and alternatives were explained to the patient. Questions regarding the procedure were encouraged and answered. The patient understands and consents to the procedure. Ultrasound survey was performed with images stored and sent to PACs. The right neck and chest was prepped with chlorhexidine, and draped in the usual sterile fashion using maximum barrier technique (cap and mask, sterile gown, sterile gloves, large sterile sheet, hand hygiene and cutaneous antiseptic). Antibiotic prophylaxis was provided with 2.0g Ancef administered IV one hour prior to skin incision. Local anesthesia was attained by infiltration with 1% lidocaine without epinephrine. Ultrasound demonstrated patency of the right internal jugular vein, and this was documented with an image. Under real-time ultrasound guidance, this vein was accessed with a 21 gauge micropuncture needle and image documentation was performed. A small dermatotomy was made at the access site with an 11 scalpel. A 0.018" wire was advanced into the SVC and used to estimate the length of the internal catheter. The access needle exchanged for a 47F micropuncture vascular sheath. The 0.018" wire was then removed and a 0.035" wire advanced into the IVC. An appropriate location for the subcutaneous reservoir was selected below the clavicle and an incision was made through the skin and underlying soft tissues. The subcutaneous tissues were then dissected using a combination of blunt and sharp surgical  technique and a pocket was formed. A single lumen power injectable portacatheter was then tunneled through the subcutaneous tissues from the pocket to the dermatotomy and the port reservoir placed within the subcutaneous pocket. The venous access site was then serially dilated and a peel away vascular sheath placed over the wire. The wire was removed and the port catheter advanced into position under fluoroscopic guidance. The catheter tip is positioned in the cavoatrial junction. This was documented with a spot image. The portacatheter was then tested and found to flush and aspirate well. The port was flushed with saline followed by 100 units/mL heparinized saline. The pocket was  then closed in two layers using first subdermal inverted interrupted absorbable sutures followed by a running subcuticular suture. The epidermis was then sealed with Dermabond. The dermatotomy at the venous access site was also seal with Dermabond. Patient tolerated the procedure well and remained hemodynamically stable throughout. No complications encountered and no significant blood loss encountered IMPRESSION: Status post right IJ port catheter placement. Catheter ready for use. Signed, Dulcy Fanny. Earleen Newport, DO [facility] Electronically Signed   By: Corrie Mckusick D.O.   On: 08/20/2016 13:54   Dg Chest Port 1 View  Result Date: 09/01/2016 CLINICAL DATA:  Initial evaluation for acute weakness, fever, urinary retention. EXAM: PORTABLE CHEST 1 VIEW COMPARISON:  Prior radiograph from 07/01/2016. FINDINGS: Right-sided Port-A-Cath in place with tip overlying the distal SVC. Median sternotomy wires underlying CABG markers and surgical clips noted. Stable mild cardiomegaly. Mediastinal silhouette normal. Aortic atherosclerosis. Lungs mildly hypoinflated. Mild patchy and hazy right basilar opacity, favored to reflect atelectasis/ bronchovascular crowding, although superimposed infiltrate  could be considered in the correct clinical setting. No other focal airspace disease. No pulmonary edema or pleural effusion. No pneumothorax. No acute osseus abnormality. IMPRESSION: 1. Shallow lung inflation with patchy and hazy right basilar opacity. Atelectasis/bronchovascular crowding is favored, although mild infiltrate could be considered in the correct clinical setting. 2. Stable cardiomegaly with sequelae of prior CABG. Electronically Signed   By: Jeannine Boga M.D.   On: 09/01/2016 22:41   Ir Fluoro Guide Port Insertion Right  Result Date: 08/20/2016 INDICATION: 81 year old male with B-cell lymphoma EXAM: IMPLANTED PORT A CATH PLACEMENT WITH ULTRASOUND AND FLUOROSCOPIC GUIDANCE MEDICATIONS: 2.0 g Ancef; The antibiotic was administered within an appropriate time interval prior to skin puncture. ANESTHESIA/SEDATION: Moderate (conscious) sedation was employed during this procedure. A total of Versed 2.0 mg and Fentanyl 50 mcg was administered intravenously. Moderate Sedation Time: 18 minutes. The patient's level of consciousness and vital signs were monitored continuously by radiology nursing throughout the procedure under my direct supervision. FLUOROSCOPY TIME:  Zero minutes, 6 seconds (1.3 mGy) COMPLICATIONS: None PROCEDURE: The procedure, risks, benefits, and alternatives were explained to the patient. Questions regarding the procedure were encouraged and answered. The patient understands and consents to the procedure. Ultrasound survey was performed with images stored and sent to PACs. The right neck and chest was prepped with chlorhexidine, and draped in the usual sterile fashion using maximum barrier technique (cap and mask, sterile gown, sterile gloves, large sterile sheet, hand hygiene and cutaneous antiseptic). Antibiotic prophylaxis was provided with 2.0g Ancef administered IV one hour prior to skin incision. Local anesthesia was attained by infiltration with 1% lidocaine without  epinephrine. Ultrasound demonstrated patency of the right internal jugular vein, and this was documented with an image. Under real-time ultrasound guidance, this vein was accessed with a 21 gauge micropuncture needle and image documentation was performed. A small dermatotomy was made at the access site with an 11 scalpel. A 0.018" wire was advanced into the SVC and used to estimate the length of the internal catheter. The access needle exchanged for a 52F micropuncture vascular sheath. The 0.018" wire was then removed and a 0.035" wire advanced into the IVC. An appropriate location for the subcutaneous reservoir was selected below the clavicle and an incision was made through the skin and underlying soft tissues. The subcutaneous tissues were then dissected using a combination of blunt and sharp surgical technique and a pocket was formed. A single lumen power injectable portacatheter was then tunneled through  the subcutaneous tissues from the pocket to the dermatotomy and the port reservoir placed within the subcutaneous pocket. The venous access site was then serially dilated and a peel away vascular sheath placed over the wire. The wire was removed and the port catheter advanced into position under fluoroscopic guidance. The catheter tip is positioned in the cavoatrial junction. This was documented with a spot image. The portacatheter was then tested and found to flush and aspirate well. The port was flushed with saline followed by 100 units/mL heparinized saline. The pocket was then closed in two layers using first subdermal inverted interrupted absorbable sutures followed by a running subcuticular suture. The epidermis was then sealed with Dermabond. The dermatotomy at the venous access site was also seal with Dermabond. Patient tolerated the procedure well and remained hemodynamically stable throughout. No complications encountered and no significant blood loss encountered IMPRESSION: Status post right IJ port  catheter placement. Catheter ready for use. Signed, Dulcy Fanny. Earleen Newport, DO [facility]iology Electronically Signed   By: Corrie Mckusick D.O.   On: 08/20/2016 13:54    Microbiology: Recent Results (from the past 240 hour(s))  Blood Culture (routine x 2)     Status: Abnormal   Collection Time: 09/01/16 11:00 PM  Result Value Ref Range Status   Specimen Description BLOOD BLOOD LEFT FOREARM  Final   Special Requests IN PEDIATRIC BOTTLE Blood Culture adequate volume  Final   Culture  Setup Time   Final    GRAM NEGATIVE RODS IN PEDIATRIC BOTTLE CRITICAL VALUE NOTED.  VALUE IS CONSISTENT WITH PREVIOUSLY REPORTED AND CALLED VALUE.    Culture (A)  Final    PSEUDOMONAS AERUGINOSA CRITICAL RESULT CALLED TO, READ BACK BY AND VERIFIED WITH: T GREEN,PHARMD AT 1505 09/03/16 BY L BENFIELD CONCERNING DIFFERENT ORGANISM THAN OTHER BLOOD CULTURE SET Performed at [facility], [place] [address] [place], [place] [address]    Report Status 09/05/2016 FINAL  Final   Organism ID, Bacteria PSEUDOMONAS AERUGINOSA  Final      Susceptibility   Pseudomonas aeruginosa - MIC*    CEFTAZIDIME 4 SENSITIVE Sensitive     CIPROFLOXACIN <=0.25 SENSITIVE Sensitive     GENTAMICIN <=1 SENSITIVE Sensitive     IMIPENEM 1 SENSITIVE Sensitive     PIP/TAZO 8 SENSITIVE Sensitive     CEFEPIME 2 SENSITIVE Sensitive     * PSEUDOMONAS AERUGINOSA  Blood Culture (routine x 2)     Status: Abnormal   Collection Time: 09/01/16 11:07 PM  Result Value Ref Range Status   Specimen Description BLOOD PORT  Final   Special Requests   Final    BOTTLES DRAWN AEROBIC AND ANAEROBIC Blood Culture adequate volume   Culture  Setup Time   Final    GRAM NEGATIVE RODS AEROBIC BOTTLE ONLY CRITICAL RESULT CALLED TO, READ BACK BY AND VERIFIED WITHKeturah Barre [facility] PHARMD 2024 09/02/16 A BROWNING Performed at [facility], [place] [address], [place], [place] [address]    Culture ESCHERICHIA COLI (A)   Final   Report Status 09/04/2016 FINAL  Final   Organism ID, Bacteria ESCHERICHIA COLI  Final      Susceptibility   Escherichia coli - MIC*    AMPICILLIN >=32 RESISTANT Resistant     CEFAZOLIN <=4 SENSITIVE Sensitive     CEFEPIME <=1 SENSITIVE Sensitive     CEFTAZIDIME <=1 SENSITIVE Sensitive     CEFTRIAXONE <=1 SENSITIVE Sensitive     CIPROFLOXACIN <=0.25 SENSITIVE Sensitive  GENTAMICIN <=1 SENSITIVE Sensitive     IMIPENEM <=0.25 SENSITIVE Sensitive     TRIMETH/SULFA <=20 SENSITIVE Sensitive     AMPICILLIN/SULBACTAM 16 INTERMEDIATE Intermediate     PIP/TAZO <=4 SENSITIVE Sensitive     Extended ESBL NEGATIVE Sensitive     * ESCHERICHIA COLI  Blood Culture ID Panel (Reflexed)     Status: Abnormal   Collection Time: 09/01/16 11:07 PM  Result Value Ref Range Status   Enterococcus species NOT DETECTED NOT DETECTED Final   Listeria monocytogenes NOT DETECTED NOT DETECTED Final   Staphylococcus species NOT DETECTED NOT DETECTED Final   Staphylococcus aureus NOT DETECTED NOT DETECTED Final   Streptococcus species NOT DETECTED NOT DETECTED Final   Streptococcus agalactiae NOT DETECTED NOT DETECTED Final   Streptococcus pneumoniae NOT DETECTED NOT DETECTED Final   Streptococcus pyogenes NOT DETECTED NOT DETECTED Final   Acinetobacter baumannii NOT DETECTED NOT DETECTED Final   Enterobacteriaceae species DETECTED (A) NOT DETECTED Final    Comment: Enterobacteriaceae represent a large family of gram-negative bacteria, not a single organism. CRITICAL RESULT CALLED TO, READ BACK BY AND VERIFIED WITH: D [facility] PHARMD 2024 09/02/16 A BROWNING    Enterobacter cloacae complex NOT DETECTED NOT DETECTED Final   Escherichia coli DETECTED (A) NOT DETECTED Final    Comment: CRITICAL RESULT CALLED TO, READ BACK BY AND VERIFIED WITH: D [facility] PHARMD 2024 09/02/16 A BROWNING    Klebsiella oxytoca NOT DETECTED NOT DETECTED Final   Klebsiella pneumoniae NOT DETECTED NOT DETECTED Final   Proteus  species NOT DETECTED NOT DETECTED Final   Serratia marcescens NOT DETECTED NOT DETECTED Final   Carbapenem resistance NOT DETECTED NOT DETECTED Final   Haemophilus influenzae NOT DETECTED NOT DETECTED Final   Neisseria meningitidis NOT DETECTED NOT DETECTED Final   Pseudomonas aeruginosa NOT DETECTED NOT DETECTED Final   Candida albicans NOT DETECTED NOT DETECTED Final   Candida glabrata NOT DETECTED NOT DETECTED Final   Candida krusei NOT DETECTED NOT DETECTED Final   Candida parapsilosis NOT DETECTED NOT DETECTED Final   Candida tropicalis NOT DETECTED NOT DETECTED Final    Comment: Performed at [facility], [place] [address] [place], [place] [address]  Urine culture     Status: Abnormal   Collection Time: 09/01/16 11:14 PM  Result Value Ref Range Status   Specimen Description URINE, RANDOM  Final   Special Requests NONE  Final   Culture MULTIPLE SPECIES PRESENT, SUGGEST RECOLLECTION (A)  Final   Report Status 09/03/2016 FINAL  Final  MRSA PCR Screening     Status: None   Collection Time: 09/02/16  2:33 AM  Result Value Ref Range Status   MRSA by PCR NEGATIVE NEGATIVE Final    Comment:        The GeneXpert MRSA Assay (FDA approved for NASAL specimens only), is one component of a comprehensive MRSA colonization surveillance program. It is not intended to diagnose MRSA infection nor to guide or monitor treatment for MRSA infections.      Labs: Basic Metabolic Panel:  Recent Labs Lab 08/31/16 0832 09/01/16 2300 09/03/16 1035 09/04/16 0500  NA 141 137 136 136  K 4.5 4.3 3.7 4.1  CL  --  105 110 109  CO2 '22 23 22 '$ 21*  GLUCOSE 132 152* 83 96  BUN 58.1* 45* 29* 26*  CREATININE 2.0* 2.07* 1.66* 1.66*  CALCIUM 8.5 7.8* 7.3* 7.4*   Liver Function Tests:  Recent Labs Lab 08/31/16 0832 09/01/16 2300 09/03/16 1035  AST  $'14 16 17  'P$ ALT 40 37 33  ALKPHOS 51 40 39  BILITOT 1.13 1.8* 0.9  PROT 5.6* 5.5* 5.2*  ALBUMIN 2.9* 2.7* 2.2*   No results for input(s):  LIPASE, AMYLASE in the last 168 hours. No results for input(s): AMMONIA in the last 168 hours. CBC:  Recent Labs Lab 08/31/16 0832 09/01/16 2300 09/03/16 1035 09/04/16 0500 09/05/16 0500  WBC 0.4* 0.1* 0.6* 1.3* 2.4*  NEUTROABS 0.3* 0.0* 0.4* 1.1* 2.1  HGB 10.5* 9.2* 8.5* 8.0* 7.9*  HCT 31.3* 27.3* 25.0* 24.0* 23.4*  MCV 99.7* 97.5 99.6 97.6 99.2  PLT 62* 39* 30* 36* 42*   Cardiac Enzymes: No results for input(s): CKTOTAL, CKMB, CKMBINDEX, TROPONINI in the last 168 hours. BNP: BNP (last 3 results)  Recent Labs  09/01/16 2300  BNP 288.4*    ProBNP (last 3 results) No results for input(s): PROBNP in the last 8760 hours.  CBG: No results for input(s): GLUCAP in the last 168 hours.     Signed:  Annita Brod, MD [facility] 09/05/2016, 1:05 PM

## 2016-09-05 NOTE — Care Management Note (Signed)
Case Management Note  Patient Details  Name: Luke Mcbride MRN: [id number] Date of Birth: 09/18/1935  Subjective/Objective:     Neutropenia with fever,  Diffuse large B cell lymphoma              Action/Plan: Discharge Planning: NCM spoke to pt and wife at bedside. Pt requesting RW for home. Contacted [facility] DME rep for RW to be delivered to room prior to dc. Offered choice for HH/pt declines HH. States he had HH in the past and was not completely satisfied with service. Explained he can always speak to his PCP to order [facility] or outpt PT at later date if decides he wanted therapy post chemotherapy treatments. Explained to wife she can pick up gait belt and other safety equipment at [employer] supply, retail store or online.   PCP Donato Heinz MD  Expected Discharge Date:  09/05/16               Expected Discharge Plan:  Assisted Living / Rest Home  In-House Referral:  Clinical Social Work  Discharge planning Services  CM Consult  Post Acute Care Choice:  Home Health Choice offered to:  Patient, Spouse  DME Arranged:  Walker rolling DME Agency:  [place]:  Patient Refused [place] Agency:     Status of Service:  Completed, signed off  If discussed at [place] of Stay Meetings, dates discussed:    Additional Comments:  Erenest Rasher, RN 09/05/2016, 2:22 PM

## 2016-09-07 ENCOUNTER — Other Ambulatory Visit: Payer: Self-pay | Admitting: *Deleted

## 2016-09-07 ENCOUNTER — Emergency Department (HOSPITAL_COMMUNITY)
Admission: EM | Admit: 2016-09-07 | Discharge: 2016-09-07 | Disposition: A | Discharge status: Home / Self Care | Payer: Medicare Other | Attending: Emergency Medicine | Admitting: Emergency Medicine

## 2016-09-07 ENCOUNTER — Ambulatory Visit (HOSPITAL_BASED_OUTPATIENT_CLINIC_OR_DEPARTMENT_OTHER): Payer: Medicare Other

## 2016-09-07 ENCOUNTER — Emergency Department (HOSPITAL_COMMUNITY): Payer: Medicare Other

## 2016-09-07 ENCOUNTER — Other Ambulatory Visit (HOSPITAL_BASED_OUTPATIENT_CLINIC_OR_DEPARTMENT_OTHER): Payer: Medicare Other

## 2016-09-07 ENCOUNTER — Encounter (HOSPITAL_COMMUNITY): Payer: Self-pay | Admitting: Emergency Medicine

## 2016-09-07 VITALS — BP 118/75 | HR 106 | Temp 98.0°F

## 2016-09-07 DIAGNOSIS — R531 Weakness: Secondary | ICD-10-CM | POA: Insufficient documentation

## 2016-09-07 DIAGNOSIS — C8338 Diffuse large B-cell lymphoma, lymph nodes of multiple sites: Secondary | ICD-10-CM

## 2016-09-07 DIAGNOSIS — D649 Anemia, unspecified: Secondary | ICD-10-CM

## 2016-09-07 DIAGNOSIS — D696 Thrombocytopenia, unspecified: Secondary | ICD-10-CM

## 2016-09-07 DIAGNOSIS — Z95828 Presence of other vascular implants and grafts: Secondary | ICD-10-CM

## 2016-09-07 DIAGNOSIS — D702 Other drug-induced agranulocytosis: Secondary | ICD-10-CM

## 2016-09-07 DIAGNOSIS — R918 Other nonspecific abnormal finding of lung field: Secondary | ICD-10-CM | POA: Diagnosis not present

## 2016-09-07 LAB — TYPE AND SCREEN
ABO/RH(D): A POS
ANTIBODY SCREEN: NEGATIVE
UNIT DIVISION: 0

## 2016-09-07 LAB — CBC & DIFF AND RETIC
BASO%: 0.3 % (ref 0.0–2.0)
BASOS ABS: 0 10*3/uL (ref 0.0–0.1)
EOS ABS: 0 10*3/uL (ref 0.0–0.5)
EOS%: 0.8 % (ref 0.0–7.0)
HCT: 26.4 % — ABNORMAL LOW (ref 38.4–49.9)
HEMOGLOBIN: 8.7 g/dL — AB (ref 13.0–17.1)
Immature Retic Fract: 20.2 % — ABNORMAL HIGH (ref 3.00–10.60)
LYMPH%: 9.4 % — AB (ref 14.0–49.0)
MCH: 33 pg (ref 27.2–33.4)
MCHC: 33 g/dL (ref 32.0–36.0)
MCV: 100 fL — AB (ref 79.3–98.0)
MONO#: 0.5 10*3/uL (ref 0.1–0.9)
MONO%: 11.5 % (ref 0.0–14.0)
NEUT#: 3.1 10*3/uL (ref 1.5–6.5)
NEUT%: 78 % — ABNORMAL HIGH (ref 39.0–75.0)
NRBC: 1 % — AB (ref 0–0)
PLATELETS: 90 10*3/uL — AB (ref 140–400)
RBC: 2.64 10*6/uL — ABNORMAL LOW (ref 4.20–5.82)
RDW: 18.8 % — ABNORMAL HIGH (ref 11.0–14.6)
Retic %: 1.51 % (ref 0.80–1.80)
Retic Ct Abs: 39.86 10*3/uL (ref 34.80–93.90)
WBC: 3.9 10*3/uL — ABNORMAL LOW (ref 4.0–10.3)
lymph#: 0.4 10*3/uL — ABNORMAL LOW (ref 0.9–3.3)

## 2016-09-07 LAB — COMPREHENSIVE METABOLIC PANEL
ALK PHOS: 80 U/L (ref 40–150)
ALT: 65 U/L — ABNORMAL HIGH (ref 0–55)
AST: 30 U/L (ref 5–34)
Albumin: 2.2 g/dL — ABNORMAL LOW (ref 3.5–5.0)
Anion Gap: 8 mEq/L (ref 3–11)
BUN: 32.2 mg/dL — ABNORMAL HIGH (ref 7.0–26.0)
CO2: 19 mEq/L — ABNORMAL LOW (ref 22–29)
Calcium: 8.2 mg/dL — ABNORMAL LOW (ref 8.4–10.4)
Chloride: 111 mEq/L — ABNORMAL HIGH (ref 98–109)
Creatinine: 1.9 mg/dL — ABNORMAL HIGH (ref 0.7–1.3)
EGFR: 33 mL/min/{1.73_m2} — AB (ref >90)
GLUCOSE: 108 mg/dL (ref 70–140)
Potassium: 4.6 mEq/L (ref 3.5–5.1)
SODIUM: 138 meq/L (ref 136–145)
Total Bilirubin: 0.26 mg/dL (ref 0.20–1.20)
Total Protein: 5.7 g/dL — ABNORMAL LOW (ref 6.4–8.3)

## 2016-09-07 LAB — BPAM RBC
BLOOD PRODUCT EXPIRATION DATE: 201805102359
Unit Type and Rh: 6200

## 2016-09-07 MED ORDER — SODIUM CHLORIDE 0.9% FLUSH
10.0000 mL | INTRAVENOUS | Status: DC | PRN
  Administered 2016-09-07: 10 mL via INTRAVENOUS
  Filled 2016-09-07: qty 10

## 2016-09-07 MED ORDER — HEPARIN SOD (PORK) LOCK FLUSH 100 UNIT/ML IV SOLN
500.0000 [IU] | Freq: Once | INTRAVENOUS | Status: DC
  Filled 2016-09-07: qty 5

## 2016-09-07 MED ORDER — HEPARIN SOD (PORK) LOCK FLUSH 100 UNIT/ML IV SOLN
500.0000 [IU] | Freq: Once | INTRAVENOUS | Status: AC
  Administered 2016-09-07: 500 [IU]
  Filled 2016-09-07: qty 5

## 2016-09-07 MED ORDER — ACETAMINOPHEN 500 MG PO TABS
1000.0000 mg | ORAL_TABLET | Freq: Once | ORAL | Status: AC
  Administered 2016-09-07: 1000 mg via ORAL
  Filled 2016-09-07: qty 2

## 2016-09-07 NOTE — Progress Notes (Signed)
Patient complains of increased Shortness of breath. Stating the his shortness of breath has increased over the past couple of days. Dr. Irene Limbo notified. Patient instructed to report to the ED per Dr. Irene Limbo. Patient and spouse verbalized understanding.

## 2016-09-07 NOTE — ED Provider Notes (Signed)
[facility] DEPT Provider Note   CSN: [id number] Arrival date & time: 09/07/16  1247     History   Chief Complaint Chief Complaint  Patient presents with  . Anemia  . Sent From [facility]    HPI Luke Mcbride is a 81 y.o. male.  The history is provided by the patient.  Patient was told he was anemic and may require transfusion as a recent CBC showed Hb of 7.9. Labs were drawn today and they are uptrending. He states he gets short of breath with activity and this occurred today when he was walking into clinic. No other complaints.  Past Medical History:  Diagnosis Date  . ALLERGIC RHINITIS   . Anemia    hx  . Barrett's esophagus   . BOOP (bronchiolitis obliterans with organizing pneumonia) ([place])    a. s/p R VATS 2008.  [name] CAD (coronary artery disease)    a. 05/2000: NSTEMI/CABG x 6: LIMA->LAD, VG->D1, VG->OM1->2, VG->PDA->RPL;  b. 07/2007 MV: high lat infarct, no ischemia, EF 47%;  c. Cath/PCI: LM nl, LAD20p, 75m, D1 nl, D2 60-70ost, D3 nl, LCX 70ost, 134m, OM1/OM2 min irregs, RCA 30 diff, PDA 99, LIMA->LAD atretic, VG->D1 100, VG->OM1->2 100, VG->PDA->RPL 90p (4.0x23 Vision BMS);  c. 07/2012 Echo: EF 55%, gr1 DD.  [name] Cataract   . CKD (chronic kidney disease), stage III    a. renal bx 2008: GLN with vasculitis  . Diffuse large B cell lymphoma ([place]) 08/07/2016  . GERD (gastroesophageal reflux disease)   . Glaucoma    a. Cannot see out of L eye.  . Hematuria    Microscopic  . Hyperglycemia    Patient reported while on prednisone, had to take insulin  . Hyperlipemia   . Hypertension   . Hypothyroidism   . ILD (interstitial lung disease) ([place])   . Iritis   . Local infection of skin and subcutaneous tissue   . Membranoproliferative nephritis   . Myocardial infarction ([place]) 2002  . Neutropenia, drug-induced ([place])   . Recurrent boils   . Residual foreign body in soft tissue   . Vasculitis ([place])    a.  pauciimmune vasculitis with renal involvement and hx of transient  hemoptysis in the past with associated BOOP, 2008 (renal bx 2008: GLN with vasculitis). b. History of treatment with 2 cycles of Cytoxan and pheresis. H/o hemoptysis and pulm hemorrhage with 2nd cycle of cytoxan.    Patient Active Problem List   Diagnosis Date Noted  . Acute lower UTI 09/02/2016  . Pancytopenia ([place]) 09/02/2016  . Neutropenia with fever ([place]) 09/02/2016  . Sepsis ([place]) 09/02/2016  . Chemotherapy-induced neutropenia ([place])   . Diffuse large B cell lymphoma ([place]) 08/07/2016  . S/P small bowel resection 07/12/2016  . Spinal stenosis 07/08/2015  . CKD (chronic kidney disease), stage III 07/29/2012  . NSTEMI (non-ST elevated myocardial infarction) ([place]) 07/29/2012  . Vasculitis ([place])   . ALLERGIC RHINITIS 07/16/2010  . HYPERCHOLESTEROLEMIA 12/04/2008  . ANEMIA 12/04/2008  . IRITIS 12/04/2008  . RENAL INSUFFICIENCY 12/04/2008  . Coronary atherosclerosis 07/23/2008  . GERD 04/15/2008  . Barrett's esophagus 04/15/2008  . HYPERLIPIDEMIA 07/20/2007  . Essential hypertension, benign 05/23/2007  . FOREIGN BODY, SOFT TISSUE, RESIDUAL 04/24/2007  . INTERSTITIAL LUNG DISEASE 02/05/2007  . RESTLESS LEG SYNDROME, SEVERE 01/26/2007  . NEPHRITIS, MEMBRANOPROLIFERATIVE 01/26/2007  . Hypothyroidism 11/30/2006  . DIABETES MELLITUS, TYPE II 11/30/2006  . Myocardial infarction ([place]) 05/17/2000    Past Surgical History:  Procedure Laterality Date  . BOWEL  RESECTION N/A 07/12/2016   Procedure: SMALL BOWEL RESECTION;  Surgeon: Rolm Bookbinder, MD;  Location: [facility];  Service: General;  Laterality: N/A;  . CATARACT EXTRACTION Bilateral   . CORONARY ARTERY BYPASS GRAFT  2002  . IR FLUORO GUIDE PORT INSERTION RIGHT  08/20/2016  . IR US GUIDE VASC ACCESS RIGHT  08/20/2016  . LAPAROSCOPY N/A 07/12/2016   Procedure: LAPAROSCOPY DIAGNOSTIC;  Surgeon: Rolm Bookbinder, MD;  Location: [facility];  Service: General;  Laterality: N/A;  . LEFT HEART CATHETERIZATION WITH CORONARY/GRAFT ANGIOGRAM N/A  07/26/2012   Procedure: LEFT HEART CATHETERIZATION WITH Beatrix Fetters;  Surgeon: Wellington Hampshire, MD;  Location: [facility] CATH LAB;  Service: Cardiovascular;  Laterality: N/A;  . LUNG BIOPSY    . PERCUTANEOUS CORONARY STENT INTERVENTION (PCI-S)  07/26/2012   Procedure: PERCUTANEOUS CORONARY STENT INTERVENTION (PCI-S);  Surgeon: Wellington Hampshire, MD;  Location: [facility] CATH LAB;  Service: Cardiovascular;;  . RENAL BIOPSY    . TOE AMPUTATION  2009   hammer toe       Home Medications    Prior to Admission medications   Medication Sig Start Date End Date Taking? Authorizing Provider  acetaminophen (TYLENOL) 500 MG tablet Take 500-1,000 mg by mouth daily as needed for moderate pain or headache.   Yes Historical Provider, MD  allopurinol (ZYLOPRIM) 100 MG tablet Take 1 tablet (100 mg total) by mouth 2 (two) times daily. Start taking after stopping Imuran 08/13/16  Yes Gautam Juleen China, MD  aspirin EC 81 MG EC tablet Take 1 tablet (81 mg total) by mouth daily. 07/29/12  Yes Rogelia Mire, NP  ciprofloxacin (CIPRO) 500 MG tablet Take 1 tablet (500 mg total) by mouth 2 (two) times daily. 09/05/16  Yes Annita Brod, MD  clopidogrel (PLAVIX) 75 MG tablet Take 1 tablet (75 mg total) by mouth daily. 02/09/16  Yes [name], MD  dexamethasone (DECADRON) 4 MG tablet Take 1 tablet (4 mg total) by mouth 2 (two) times daily with a meal. 08/13/16  Yes Brunetta Genera, MD  finasteride (PROSCAR) 5 MG tablet Take 5 mg by mouth at bedtime.    Yes Historical Provider, MD  levothyroxine (SYNTHROID, LEVOTHROID) 100 MCG tablet Take 100 mcg by mouth daily.     Yes Historical Provider, MD  lidocaine-prilocaine (EMLA) cream Apply to affected area once 08/13/16  Yes Brunetta Genera, MD  metoprolol tartrate (LOPRESSOR) 25 MG tablet Take 0.5 tablets (12.5 mg total) by mouth 2 (two) times daily. 02/09/16  Yes [name], MD  Multiple Vitamin (MULTIVITAMIN) tablet Take 1 tablet by mouth daily.     Yes  Historical Provider, MD  ondansetron (ZOFRAN) 8 MG tablet Take 1 tablet (8 mg total) by mouth 2 (two) times daily as needed for refractory nausea / vomiting. Start on day 3 after cyclophosphamide. 08/13/16  Yes Brunetta Genera, MD  pantoprazole (PROTONIX) 40 MG tablet TAKE 1 TABLET (40 MG TOTAL) BY MOUTH DAILY. 02/09/16  Yes [name], MD  prochlorperazine (COMPAZINE) 10 MG tablet Take 1 tablet (10 mg total) by mouth every 6 (six) hours as needed (Nausea or vomiting). 08/13/16  Yes Brunetta Genera, MD  rosuvastatin (CRESTOR) 10 MG tablet Take 10 mg by mouth at bedtime.    Yes Historical Provider, MD  Tamsulosin HCl (FLOMAX) 0.4 MG CAPS Take 0.4 mg by mouth at bedtime.    Yes Historical Provider, MD  telmisartan (MICARDIS) 40 MG tablet Take 20 mg by mouth 2 (two) times  daily.    Yes Historical Provider, MD  triamcinolone (KENALOG) 0.025 % cream Apply topically 2 (two) times daily. 09/05/16  Yes Annita Brod, MD  LORazepam (ATIVAN) 0.5 MG tablet Take 1 tablet (0.5 mg total) by mouth every 8 (eight) hours as needed for anxiety or sleep. Patient not taking: Reported on 09/07/2016 08/31/16   Brunetta Genera, MD  NITROSTAT 0.4 MG SL tablet PLACE 1 TABLET (0.4 MG TOTAL) UNDER THE TONGUE EVERY 5 (FIVE) MINUTES AS NEEDED. MAY REPEAT X3 Patient taking differently: Place 0.4 mg under the tongue every 5 (five) minutes as needed for chest pain. May repeat x3 02/10/16   [name], MD  predniSONE (DELTASONE) 20 MG tablet Take 3 tablets (60 mg total) by mouth daily. Take on days 1-5 of chemotherapy. Patient not taking: Reported on 09/07/2016 08/13/16   Brunetta Genera, MD    Family History Family History  Problem Relation Age of Onset  . Heart disease Mother   . Diabetes Mother   . Prostate cancer Father   . Depression Other   . Diabetes Other   . Prostate cancer Other   . Colon polyps Neg Hx   . Colon cancer Neg Hx   . Rectal cancer Neg Hx   . Stomach cancer Neg Hx   . Esophageal  cancer Neg Hx     Social History Social History  Substance Use Topics  . Smoking status: Never Smoker  . Smokeless tobacco: Never Used  . Alcohol use No     Allergies   No known allergies   Review of Systems Review of Systems  All other systems reviewed and are negative.    Physical Exam Updated Vital Signs BP 118/72 (BP Location: Right Arm)   Pulse 82   Temp 98.1 F (36.7 C) (Oral)   Resp 18   Ht 6' (1.829 m)   Wt 217 lb (98.4 kg)   SpO2 100%   BMI 29.43 kg/m   Physical Exam  Constitutional: He is oriented to person, place, and time. He appears well-developed and well-nourished. No distress.  HENT:  Head: Normocephalic and atraumatic.  Nose: Nose normal.  Eyes: Conjunctivae are normal.  Neck: Neck supple. No tracheal deviation present.  Cardiovascular: Normal rate and regular rhythm.   Pulmonary/Chest: Effort normal. No respiratory distress.  Abdominal: Soft. He exhibits no distension.  Neurological: He is alert and oriented to person, place, and time.  Skin: Skin is warm and dry. There is pallor.  Psychiatric: He has a normal mood and affect.     ED Treatments / Results  Labs (all labs ordered are listed, but only abnormal results are displayed) Labs Reviewed - No data to display  EKG  EKG Interpretation None       Radiology Dg Chest 2 View  Result Date: 09/07/2016 CLINICAL DATA:  Abnormal chest x-ray previously. Patient presents the ER for low hemoglobin. EXAM: CHEST  2 VIEW COMPARISON:  09/01/2016 FINDINGS: Normal heart size and stable mediastinal contours. Status post CABG. Porta catheter on the right with tip at the SVC level. Postoperative changes at the right lung base. There is no edema, consolidation, effusion, or pneumothorax. No acute osseous finding. IMPRESSION: No evidence of active disease. Electronically Signed   By: Monte Fantasia M.D.   On: 09/07/2016 13:52    Procedures Procedures (including critical care time)  Medications  Ordered in ED Medications  acetaminophen (TYLENOL) tablet 1,000 mg (not administered)     Initial Impression / Assessment  and Plan / ED Course  I have reviewed the triage vital signs and the nursing notes.  Pertinent labs & imaging results that were available during my care of the patient were reviewed by me and considered in my medical decision making (see chart for details).     81 y.o. male presents with ongoing shortness of breath with recommendation from his cancer center providers for evaluation in ED and possible transfusion. Compared to prior CBC his Hb is uptrending and has appropriate reactive hematopoesis with high retic count. Do notfeel  He requires transfusion nd he wishes to be at home which is appropriate with lack of admission indication currently.   Final Clinical Impressions(s) / ED Diagnoses   Final diagnoses:  Weakness    New Prescriptions Discharge Medication List as of 09/07/2016  3:45 PM       Leo Grosser, MD 09/07/16 [phone]

## 2016-09-07 NOTE — ED Notes (Signed)
Bed: WA01 Expected date:  Expected time:  Means of arrival:  Comments: Ready for triage

## 2016-09-07 NOTE — Patient Instructions (Signed)
Implanted Port Home Guide An implanted port is a type of central line that is placed under the skin. Central lines are used to provide IV access when treatment or nutrition needs to be given through a person's veins. Implanted ports are used for long-term IV access. An implanted port may be placed because:  You need IV medicine that would be irritating to the small veins in your hands or arms.  You need long-term IV medicines, such as antibiotics.  You need IV nutrition for a long period.  You need frequent blood draws for lab tests.  You need dialysis.  Implanted ports are usually placed in the chest area, but they can also be placed in the upper arm, the abdomen, or the leg. An implanted port has two main parts:  Reservoir. The reservoir is round and will appear as a small, raised area under your skin. The reservoir is the part where a needle is inserted to give medicines or draw blood.  Catheter. The catheter is a thin, flexible tube that extends from the reservoir. The catheter is placed into a large vein. Medicine that is inserted into the reservoir goes into the catheter and then into the vein.  How will I care for my incision site? Do not get the incision site wet. Bathe or shower as directed by your health care provider. How is my port accessed? Special steps must be taken to access the port:  Before the port is accessed, a numbing cream can be placed on the skin. This helps numb the skin over the port site.  Your health care provider uses a sterile technique to access the port. ? Your health care provider must put on a mask and sterile gloves. ? The skin over your port is cleaned carefully with an antiseptic and allowed to dry. ? The port is gently pinched between sterile gloves, and a needle is inserted into the port.  Only "non-coring" port needles should be used to access the port. Once the port is accessed, a blood return should be checked. This helps ensure that the port  is in the vein and is not clogged.  If your port needs to remain accessed for a constant infusion, a clear (transparent) bandage will be placed over the needle site. The bandage and needle will need to be changed every week, or as directed by your health care provider.  Keep the bandage covering the needle clean and dry. Do not get it wet. Follow your health care provider's instructions on how to take a shower or bath while the port is accessed.  If your port does not need to stay accessed, no bandage is needed over the port.  What is flushing? Flushing helps keep the port from getting clogged. Follow your health care provider's instructions on how and when to flush the port. Ports are usually flushed with saline solution or a medicine called heparin. The need for flushing will depend on how the port is used.  If the port is used for intermittent medicines or blood draws, the port will need to be flushed: ? After medicines have been given. ? After blood has been drawn. ? As part of routine maintenance.  If a constant infusion is running, the port may not need to be flushed.  How long will my port stay implanted? The port can stay in for as long as your health care provider thinks it is needed. When it is time for the port to come out, surgery will be   done to remove it. The procedure is similar to the one performed when the port was put in. When should I seek immediate medical care? When you have an implanted port, you should seek immediate medical care if:  You notice a bad smell coming from the incision site.  You have swelling, redness, or drainage at the incision site.  You have more swelling or pain at the port site or the surrounding area.  You have a fever that is not controlled with medicine.  This information is not intended to replace advice given to you by your health care provider. Make sure you discuss any questions you have with your health care provider. Document  Released: 05/03/2005 Document Revised: 10/09/2015 Document Reviewed: 01/08/2013 Elsevier Interactive Patient Education  2017 Elsevier Inc.  

## 2016-09-07 NOTE — ED Triage Notes (Signed)
Pt comes over from cancer center after his labs today showed a low hemoglobin.  Pt has port already accessed.  Endorses being more weak than normal.  Pt also states he was recently hospitalized for a UTI and was discharged Sunday. Previously they noted a dark spot on his lung that may be pneumonia and the family is requesting a chest x-ray as it was not done this past visit. A&O x4.  Able to ambulate with assistance.

## 2016-09-10 ENCOUNTER — Other Ambulatory Visit: Payer: Self-pay | Admitting: Hematology

## 2016-09-10 DIAGNOSIS — C8338 Diffuse large B-cell lymphoma, lymph nodes of multiple sites: Secondary | ICD-10-CM

## 2016-09-15 ENCOUNTER — Ambulatory Visit (HOSPITAL_BASED_OUTPATIENT_CLINIC_OR_DEPARTMENT_OTHER): Payer: Medicare Other | Admitting: Hematology

## 2016-09-15 ENCOUNTER — Ambulatory Visit (HOSPITAL_BASED_OUTPATIENT_CLINIC_OR_DEPARTMENT_OTHER): Payer: Medicare Other

## 2016-09-15 ENCOUNTER — Ambulatory Visit: Payer: Medicare Other

## 2016-09-15 ENCOUNTER — Other Ambulatory Visit (HOSPITAL_BASED_OUTPATIENT_CLINIC_OR_DEPARTMENT_OTHER): Payer: Medicare Other

## 2016-09-15 ENCOUNTER — Encounter: Payer: Self-pay | Admitting: Hematology

## 2016-09-15 VITALS — BP 139/69 | HR 89 | Temp 98.1°F | Resp 18

## 2016-09-15 VITALS — BP 137/75 | HR 95 | Temp 98.6°F | Resp 14 | Ht 72.0 in | Wt 223.2 lb

## 2016-09-15 DIAGNOSIS — Z5111 Encounter for antineoplastic chemotherapy: Secondary | ICD-10-CM

## 2016-09-15 DIAGNOSIS — N183 Chronic kidney disease, stage 3 (moderate): Secondary | ICD-10-CM

## 2016-09-15 DIAGNOSIS — Z5112 Encounter for antineoplastic immunotherapy: Secondary | ICD-10-CM | POA: Diagnosis not present

## 2016-09-15 DIAGNOSIS — D649 Anemia, unspecified: Secondary | ICD-10-CM

## 2016-09-15 DIAGNOSIS — Z7189 Other specified counseling: Secondary | ICD-10-CM

## 2016-09-15 DIAGNOSIS — Z95828 Presence of other vascular implants and grafts: Secondary | ICD-10-CM | POA: Insufficient documentation

## 2016-09-15 DIAGNOSIS — C8338 Diffuse large B-cell lymphoma, lymph nodes of multiple sites: Secondary | ICD-10-CM

## 2016-09-15 DIAGNOSIS — C8330 Diffuse large B-cell lymphoma, unspecified site: Secondary | ICD-10-CM

## 2016-09-15 LAB — CBC WITH DIFFERENTIAL/PLATELET
BASO%: 0.6 % (ref 0.0–2.0)
Basophils Absolute: 0.1 10*3/uL (ref 0.0–0.1)
EOS%: 0.5 % (ref 0.0–7.0)
Eosinophils Absolute: 0 10*3/uL (ref 0.0–0.5)
HCT: 27.2 % — ABNORMAL LOW (ref 38.4–49.9)
HGB: 8.6 g/dL — ABNORMAL LOW (ref 13.0–17.1)
LYMPH%: 9.6 % — AB (ref 14.0–49.0)
MCH: 32.5 pg (ref 27.2–33.4)
MCHC: 31.6 g/dL — ABNORMAL LOW (ref 32.0–36.0)
MCV: 102.6 fL — ABNORMAL HIGH (ref 79.3–98.0)
MONO#: 0.4 10*3/uL (ref 0.1–0.9)
MONO%: 4.5 % (ref 0.0–14.0)
NEUT%: 84.8 % — ABNORMAL HIGH (ref 39.0–75.0)
NEUTROS ABS: 6.7 10*3/uL — AB (ref 1.5–6.5)
Platelets: 294 10*3/uL (ref 140–400)
RBC: 2.65 10*6/uL — AB (ref 4.20–5.82)
RDW: 19.2 % — ABNORMAL HIGH (ref 11.0–14.6)
WBC: 7.9 10*3/uL (ref 4.0–10.3)
lymph#: 0.8 10*3/uL — ABNORMAL LOW (ref 0.9–3.3)

## 2016-09-15 LAB — COMPREHENSIVE METABOLIC PANEL
ALT: 56 U/L — AB (ref 0–55)
AST: 37 U/L — ABNORMAL HIGH (ref 5–34)
Albumin: 2.6 g/dL — ABNORMAL LOW (ref 3.5–5.0)
Alkaline Phosphatase: 79 U/L (ref 40–150)
Anion Gap: 10 mEq/L (ref 3–11)
BILIRUBIN TOTAL: 0.26 mg/dL (ref 0.20–1.20)
BUN: 23.4 mg/dL (ref 7.0–26.0)
CHLORIDE: 110 meq/L — AB (ref 98–109)
CO2: 22 meq/L (ref 22–29)
CREATININE: 1.7 mg/dL — AB (ref 0.7–1.3)
Calcium: 9 mg/dL (ref 8.4–10.4)
EGFR: 38 mL/min/{1.73_m2} — ABNORMAL LOW (ref >90)
GLUCOSE: 160 mg/dL — AB (ref 70–140)
Potassium: 4.4 mEq/L (ref 3.5–5.1)
SODIUM: 141 meq/L (ref 136–145)
TOTAL PROTEIN: 6.4 g/dL (ref 6.4–8.3)

## 2016-09-15 MED ORDER — SODIUM CHLORIDE 0.9 % IV SOLN
500.0000 mg/m2 | Freq: Once | INTRAVENOUS | Status: AC
  Administered 2016-09-15: 1120 mg via INTRAVENOUS
  Filled 2016-09-15: qty 56

## 2016-09-15 MED ORDER — DEXAMETHASONE SODIUM PHOSPHATE 10 MG/ML IJ SOLN
INTRAMUSCULAR | Status: AC
  Filled 2016-09-15: qty 1

## 2016-09-15 MED ORDER — ACETAMINOPHEN 325 MG PO TABS
650.0000 mg | ORAL_TABLET | Freq: Once | ORAL | Status: AC
  Administered 2016-09-15: 650 mg via ORAL

## 2016-09-15 MED ORDER — ACETAMINOPHEN 325 MG PO TABS
ORAL_TABLET | ORAL | Status: AC
  Filled 2016-09-15: qty 2

## 2016-09-15 MED ORDER — DIPHENHYDRAMINE HCL 25 MG PO CAPS
ORAL_CAPSULE | ORAL | Status: AC
  Filled 2016-09-15: qty 2

## 2016-09-15 MED ORDER — PALONOSETRON HCL INJECTION 0.25 MG/5ML
INTRAVENOUS | Status: AC
  Filled 2016-09-15: qty 5

## 2016-09-15 MED ORDER — DEXAMETHASONE SODIUM PHOSPHATE 10 MG/ML IJ SOLN
10.0000 mg | Freq: Once | INTRAMUSCULAR | Status: AC
  Administered 2016-09-15: 10 mg via INTRAVENOUS

## 2016-09-15 MED ORDER — SODIUM CHLORIDE 0.9 % IV SOLN
25.0000 mg/m2 | Freq: Once | INTRAVENOUS | Status: AC
  Administered 2016-09-15: 60 mg via INTRAVENOUS
  Filled 2016-09-15: qty 3

## 2016-09-15 MED ORDER — SODIUM CHLORIDE 0.9% FLUSH
10.0000 mL | INTRAVENOUS | Status: DC | PRN
  Administered 2016-09-15: 10 mL
  Filled 2016-09-15: qty 10

## 2016-09-15 MED ORDER — PALONOSETRON HCL INJECTION 0.25 MG/5ML
0.2500 mg | Freq: Once | INTRAVENOUS | Status: AC
  Administered 2016-09-15: 0.25 mg via INTRAVENOUS

## 2016-09-15 MED ORDER — HEPARIN SOD (PORK) LOCK FLUSH 100 UNIT/ML IV SOLN
500.0000 [IU] | Freq: Once | INTRAVENOUS | Status: AC | PRN
Start: 2016-09-15 — End: 2016-09-15
  Administered 2016-09-15: 500 [IU]
  Filled 2016-09-15: qty 5

## 2016-09-15 MED ORDER — VINCRISTINE SULFATE CHEMO INJECTION 1 MG/ML
2.0000 mg | Freq: Once | INTRAVENOUS | Status: AC
  Administered 2016-09-15: 2 mg via INTRAVENOUS
  Filled 2016-09-15: qty 2

## 2016-09-15 MED ORDER — SODIUM CHLORIDE 0.9% FLUSH
10.0000 mL | Freq: Once | INTRAVENOUS | Status: AC
  Administered 2016-09-15: 10 mL
  Filled 2016-09-15: qty 10

## 2016-09-15 MED ORDER — DIPHENHYDRAMINE HCL 25 MG PO CAPS
50.0000 mg | ORAL_CAPSULE | Freq: Once | ORAL | Status: AC
  Administered 2016-09-15: 50 mg via ORAL

## 2016-09-15 MED ORDER — SODIUM CHLORIDE 0.9 % IV SOLN
Freq: Once | INTRAVENOUS | Status: AC
  Administered 2016-09-15: 11:00:00 via INTRAVENOUS

## 2016-09-15 MED ORDER — SODIUM CHLORIDE 0.9 % IV SOLN: Freq: Once | INTRAVENOUS | Status: DC

## 2016-09-15 MED ORDER — SODIUM CHLORIDE 0.9 % IV SOLN
375.0000 mg/m2 | Freq: Once | INTRAVENOUS | Status: AC
  Administered 2016-09-15: 800 mg via INTRAVENOUS
  Filled 2016-09-15: qty 50

## 2016-09-15 NOTE — Patient Instructions (Signed)
[facility] Discharge Instructions for Patients Receiving Chemotherapy  Today you received the following chemotherapy agents:  Vincristine, Cytoxan, Etoposide and Rituxan.  To help prevent nausea and vomiting after your treatment, we encourage you to take your nausea medication as ordered per MD.   If you develop nausea and vomiting that is not controlled by your nausea medication, call the clinic.   BELOW ARE SYMPTOMS THAT SHOULD BE REPORTED IMMEDIATELY:  *FEVER GREATER THAN 100.5 F  *CHILLS WITH OR WITHOUT FEVER  NAUSEA AND VOMITING THAT IS NOT CONTROLLED WITH YOUR NAUSEA MEDICATION  *UNUSUAL SHORTNESS OF BREATH  *UNUSUAL BRUISING OR BLEEDING  TENDERNESS IN MOUTH AND THROAT WITH OR WITHOUT PRESENCE OF ULCERS  *URINARY PROBLEMS  *BOWEL PROBLEMS  UNUSUAL RASH Items with * indicate a potential emergency and should be followed up as soon as possible.  Feel free to call the clinic you have any questions or concerns. The clinic phone number is [phone].  Please show the [facility] at check-in to the Emergency Department and triage nurse.

## 2016-09-15 NOTE — Progress Notes (Signed)
Okay to treat today with creatinine of 1.7 per Delle Reining, RN per Dr. Irene Limbo.

## 2016-09-15 NOTE — Patient Instructions (Signed)
Thank you for choosing [facility] to provide your oncology and hematology care.  To afford each patient quality time with our providers, please arrive 30 minutes before your scheduled appointment time.  If you arrive late for your appointment, you may be asked to reschedule.  We strive to give you quality time with our providers, and arriving late affects you and other patients whose appointments are after yours.  If you are a no show for multiple scheduled visits, you may be dismissed from the clinic at the providers discretion.   Again, thank you for choosing [facility], our hope is that these requests will decrease the amount of time that you wait before being seen by our physicians.  ______________________________________________________________________ Should you have questions after your visit to the [facility], please contact our office at [phone] between the hours of 8:30 and 4:30 p.m.    Voicemails left after 4:30p.m will not be returned until the following business day.   For prescription refill requests, please have your pharmacy contact us directly.  Please also try to allow 48 hours for prescription requests.   Please contact the scheduling department for questions regarding scheduling.  For scheduling of procedures such as PET scans, CT scans, MRI, Ultrasound, etc please contact central scheduling at [phone].   Resources For Cancer Patients and Caregivers:  American Cancer Society:  800-227-2345  Can help patients locate various types of support and financial assistance Cancer Care: 1-800-813-HOPE (4673) Provides financial assistance, online support groups, medication/co-pay assistance.   [place] DSS:  336-641-3447 Where to apply for food stamps, Medicaid, and utility assistance Medicare Rights Center: 800-333-4114 Helps people with Medicare understand their rights and benefits, navigate the Medicare system, and secure the  quality healthcare they deserve SCAT: 336-333-6589 [place] [employer]'s shared-ride transportation service for eligible riders who have a disability that prevents them from riding the fixed route bus.   For additional information on assistance programs please contact our social worker:   Grier Hock/Abigail Elmore:  [phone] 

## 2016-09-16 ENCOUNTER — Ambulatory Visit (HOSPITAL_BASED_OUTPATIENT_CLINIC_OR_DEPARTMENT_OTHER): Payer: Medicare Other

## 2016-09-16 VITALS — BP 131/69 | HR 79 | Temp 98.7°F | Resp 16

## 2016-09-16 DIAGNOSIS — Z5111 Encounter for antineoplastic chemotherapy: Secondary | ICD-10-CM

## 2016-09-16 DIAGNOSIS — Z7189 Other specified counseling: Secondary | ICD-10-CM | POA: Insufficient documentation

## 2016-09-16 DIAGNOSIS — C8338 Diffuse large B-cell lymphoma, lymph nodes of multiple sites: Secondary | ICD-10-CM

## 2016-09-16 MED ORDER — PROCHLORPERAZINE MALEATE 10 MG PO TABS
ORAL_TABLET | ORAL | Status: AC
  Filled 2016-09-16: qty 1

## 2016-09-16 MED ORDER — DEXAMETHASONE SODIUM PHOSPHATE 10 MG/ML IJ SOLN
10.0000 mg | Freq: Once | INTRAMUSCULAR | Status: AC
  Administered 2016-09-16: 10 mg via INTRAVENOUS

## 2016-09-16 MED ORDER — SODIUM CHLORIDE 0.9% FLUSH
10.0000 mL | INTRAVENOUS | Status: DC | PRN
  Administered 2016-09-16: 10 mL
  Filled 2016-09-16: qty 10

## 2016-09-16 MED ORDER — HEPARIN SOD (PORK) LOCK FLUSH 100 UNIT/ML IV SOLN
500.0000 [IU] | Freq: Once | INTRAVENOUS | Status: AC | PRN
  Administered 2016-09-16: 500 [IU]
  Filled 2016-09-16: qty 5

## 2016-09-16 MED ORDER — SODIUM CHLORIDE 0.9 % IV SOLN
Freq: Once | INTRAVENOUS | Status: AC
  Administered 2016-09-16: 14:00:00 via INTRAVENOUS

## 2016-09-16 MED ORDER — PROCHLORPERAZINE MALEATE 10 MG PO TABS
10.0000 mg | ORAL_TABLET | Freq: Once | ORAL | Status: AC
  Administered 2016-09-16: 10 mg via ORAL

## 2016-09-16 MED ORDER — SODIUM CHLORIDE 0.9 % IV SOLN
25.0000 mg/m2 | Freq: Once | INTRAVENOUS | Status: AC
  Administered 2016-09-16: 60 mg via INTRAVENOUS
  Filled 2016-09-16: qty 3

## 2016-09-16 MED ORDER — DEXAMETHASONE SODIUM PHOSPHATE 10 MG/ML IJ SOLN
INTRAMUSCULAR | Status: AC
  Filled 2016-09-16: qty 1

## 2016-09-16 NOTE — Patient Instructions (Signed)
[facility] Discharge Instructions for Patients Receiving Chemotherapy  Today you received the following chemotherapy agents: Etoposide   To help prevent nausea and vomiting after your treatment, we encourage you to take your nausea medication as directed.    If you develop nausea and vomiting that is not controlled by your nausea medication, call the clinic.   BELOW ARE SYMPTOMS THAT SHOULD BE REPORTED IMMEDIATELY:  *FEVER GREATER THAN 100.5 F  *CHILLS WITH OR WITHOUT FEVER  NAUSEA AND VOMITING THAT IS NOT CONTROLLED WITH YOUR NAUSEA MEDICATION  *UNUSUAL SHORTNESS OF BREATH  *UNUSUAL BRUISING OR BLEEDING  TENDERNESS IN MOUTH AND THROAT WITH OR WITHOUT PRESENCE OF ULCERS  *URINARY PROBLEMS  *BOWEL PROBLEMS  UNUSUAL RASH Items with * indicate a potential emergency and should be followed up as soon as possible.  Feel free to call the clinic you have any questions or concerns. The clinic phone number is [phone].  Please show the CHEMO ALERT CARD at check-in to the Emergency Department and triage nurse.   

## 2016-09-16 NOTE — Progress Notes (Signed)
HEMATOLOGY/ONCOLOGY CLINIC NOTE  Date of Service: .09/15/2016  Patient Care Team: Luke Heinz, MD as PCP - General (Nephrology) Elsie Stain, MD as Consulting Physician (Pulmonary Disease)  CHIEF COMPLAINTS/PURPOSE OF CONSULTATION:  Diffuse large B cell lymphoma of the small intestine  HISTORY OF PRESENTING ILLNESS:   Luke Mcbride is a wonderful 81 y.o. male who has been referred to [place] by Dr .Donetta Potts, MD and Mauricio Po MD for evaluation and management of newly diagnosed diffuse large B-cell lymphoma of the small bowel.  Patient has a history of multiple medical comorbidities as noted below including coronary artery disease status post PCI and CABG, chronic kidney disease stage III, Barrett's esophagus, hypertension, dyslipidemia, interstitial lung disease, GERD, vasculitis involving the lungs and kidneys on chronic Imuran therapy.  He was having upper abdominal discomfort for a few months with loss of about 15-20 pounds. He had a CT scan of the abdomen done by Dr. Havery Moros on 06/15/2016 which showed a 3.3 x 3.2 x 2.4 cm indeterminate lesion involving the left upper quadrant small bowel loops. No additional small bowel lesions noted. No ascites. No retroperitoneal mesenteric or pelvic lymphadenopathy noted.  Patient was seen by Dr. Serita Grammes and underwent a diagnostic laparoscopy with small bowel resection on 07/12/2016. Pathology showed that the lesion was consistent with diffuse large B-cell lymphoma 2 cm in length and 4 cm in width centrally ulcerated mass involving 100% of the bowel circumference. The tumor was about 1 cm thick invading through the entire wall and into the underlying mesenteric fat. The tumor also abuts and focally involve the serosa. No extramural satellite lymph nodes noted. Did not seem to involve the peri-intestinal lymph nodes. Tumor was noted to have a germinal center phenotype.  Patient is healing well from surgery. Continues  to be on Imuran 100 mg by mouth daily. Has not reported any other peripheral enlarged lymph nodes. He is starting to eat better.   INTERVAL HISTORY  Patient is here for follow-up prior to his 2nd cycle of R-CEOP ctx for DLBCL. He was admitted for neutropenic fever with pseudomonal /E.coli bacteremia - has completed antibiotics.. Neutropenia resolved.  Discussed GOC. Patient understands that his overall health and co-morbids limit treatment intensity. Dose etoposide and cyclophosphamide reduced from this cycle.   MEDICAL HISTORY:  Past Medical History:  Diagnosis Date  . ALLERGIC RHINITIS   . Anemia    hx  . Barrett's esophagus   . BOOP (bronchiolitis obliterans with organizing pneumonia) ([place])    a. s/p R VATS 2008.  [name] CAD (coronary artery disease)    a. 05/2000: NSTEMI/CABG x 6: LIMA->LAD, VG->D1, VG->OM1->2, VG->PDA->RPL;  b. 07/2007 MV: high lat infarct, no ischemia, EF 47%;  c. Cath/PCI: LM nl, LAD20p, 89m, D1 nl, D2 60-70ost, D3 nl, LCX 70ost, 164m, OM1/OM2 min irregs, RCA 30 diff, PDA 99, LIMA->LAD atretic, VG->D1 100, VG->OM1->2 100, VG->PDA->RPL 90p (4.0x23 Vision BMS);  c. 07/2012 Echo: EF 55%, gr1 DD.  [name] Cataract   . CKD (chronic kidney disease), stage III    a. renal bx 2008: GLN with vasculitis  . Diffuse large B cell lymphoma ([place]) 08/07/2016  . GERD (gastroesophageal reflux disease)   . Glaucoma    a. Cannot see out of L eye.  . Hematuria    Microscopic  . Hyperglycemia    Patient reported while on prednisone, had to take insulin  . Hyperlipemia   . Hypertension   . Hypothyroidism   . ILD (interstitial  lung disease) ([place])   . Iritis   . Local infection of skin and subcutaneous tissue   . Membranoproliferative nephritis   . Myocardial infarction ([place]) 2002  . Neutropenia, drug-induced ([place])   . Recurrent boils   . Residual foreign body in soft tissue   . Vasculitis ([place])    a.  pauciimmune vasculitis with renal involvement and hx of transient hemoptysis in the  past with associated BOOP, 2008 (renal bx 2008: GLN with vasculitis). b. History of treatment with 2 cycles of Cytoxan and pheresis. H/o hemoptysis and pulm hemorrhage with 2nd cycle of cytoxan.    SURGICAL HISTORY: Past Surgical History:  Procedure Laterality Date  . BOWEL RESECTION N/A 07/12/2016   Procedure: SMALL BOWEL RESECTION;  Surgeon: Rolm Bookbinder, MD;  Location: [facility];  Service: General;  Laterality: N/A;  . CATARACT EXTRACTION Bilateral   . CORONARY ARTERY BYPASS GRAFT  2002  . IR FLUORO GUIDE PORT INSERTION RIGHT  08/20/2016  . IR US GUIDE VASC ACCESS RIGHT  08/20/2016  . LAPAROSCOPY N/A 07/12/2016   Procedure: LAPAROSCOPY DIAGNOSTIC;  Surgeon: Rolm Bookbinder, MD;  Location: [facility];  Service: General;  Laterality: N/A;  . LEFT HEART CATHETERIZATION WITH CORONARY/GRAFT ANGIOGRAM N/A 07/26/2012   Procedure: LEFT HEART CATHETERIZATION WITH Beatrix Fetters;  Surgeon: Wellington Hampshire, MD;  Location: [facility] CATH LAB;  Service: Cardiovascular;  Laterality: N/A;  . LUNG BIOPSY    . PERCUTANEOUS CORONARY STENT INTERVENTION (PCI-S)  07/26/2012   Procedure: PERCUTANEOUS CORONARY STENT INTERVENTION (PCI-S);  Surgeon: Wellington Hampshire, MD;  Location: [facility] C[facility];  Service: Cardiovascular;;  . RENAL BIOPSY    . TOE AMPUTATION  2009   hammer toe    SOCIAL HISTORY: Social History   Social History  . Marital status: Married    Spouse name: N/A  . Number of children: N/A  . Years of education: N/A   Occupational History  . [place] History Main Topics  . Smoking status: Never Smoker  . Smokeless tobacco: Never Used  . Alcohol use No  . Drug use: No  . Sexual activity: Not on file   Other Topics Concern  . Not on file   Social History Narrative   Regular exercise - yes       Pulmonary (07/12/16):   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  no pets currently. No bird or mold exposure.    FAMILY HISTORY: Family History  Problem  Relation Age of Onset  . Heart disease Mother   . Diabetes Mother   . Prostate cancer Father   . Depression Other   . Diabetes Other   . Prostate cancer Other   . Colon polyps Neg Hx   . Colon cancer Neg Hx   . Rectal cancer Neg Hx   . Stomach cancer Neg Hx   . Esophageal cancer Neg Hx     ALLERGIES:  is allergic to no known allergies.  MEDICATIONS:  Current Outpatient Prescriptions  Medication Sig Dispense Refill  . acetaminophen (TYLENOL) 500 MG tablet Take 500-1,000 mg by mouth daily as needed for moderate pain or headache.    . allopurinol (ZYLOPRIM) 100 MG tablet TAKE 1 TABLET (100 MG TOTAL) BY MOUTH 2 (TWO) TIMES DAILY. START TAKING AFTER STOPPING IMURAN 60 tablet 0  . aspirin EC 81 MG EC tablet Take 1 tablet (81 mg total) by mouth daily. 30 tablet   . ciprofloxacin (CIPRO) 500 MG tablet Take 1  tablet (500 mg total) by mouth 2 (two) times daily. 14 tablet 0  . clopidogrel (PLAVIX) 75 MG tablet Take 1 tablet (75 mg total) by mouth daily. 90 tablet 2  . dexamethasone (DECADRON) 4 MG tablet Take 1 tablet (4 mg total) by mouth 2 (two) times daily with a meal. 20 tablet 0  . finasteride (PROSCAR) 5 MG tablet Take 5 mg by mouth at bedtime.     [name] levothyroxine (SYNTHROID, LEVOTHROID) 100 MCG tablet Take 100 mcg by mouth daily.      [name] lidocaine-prilocaine (EMLA) cream Apply to affected area once 30 g 3  . LORazepam (ATIVAN) 0.5 MG tablet Take 1 tablet (0.5 mg total) by mouth every 8 (eight) hours as needed for anxiety or sleep. 30 tablet 0  . metoprolol tartrate (LOPRESSOR) 25 MG tablet Take 0.5 tablets (12.5 mg total) by mouth 2 (two) times daily. 90 tablet 2  . Multiple Vitamin (MULTIVITAMIN) tablet Take 1 tablet by mouth daily.      [name] NITROSTAT 0.4 MG SL tablet PLACE 1 TABLET (0.4 MG TOTAL) UNDER THE TONGUE EVERY 5 (FIVE) MINUTES AS NEEDED. MAY REPEAT X3 (Patient taking differently: Place 0.4 mg under the tongue every 5 (five) minutes as needed for chest pain. May repeat x3) 25 tablet  2  . ondansetron (ZOFRAN) 8 MG tablet Take 1 tablet (8 mg total) by mouth 2 (two) times daily as needed for refractory nausea / vomiting. Start on day 3 after cyclophosphamide. 30 tablet 1  . pantoprazole (PROTONIX) 40 MG tablet TAKE 1 TABLET (40 MG TOTAL) BY MOUTH DAILY. 90 tablet 2  . predniSONE (DELTASONE) 20 MG tablet Take 3 tablets (60 mg total) by mouth daily. Take on days 1-5 of chemotherapy. 15 tablet 6  . prochlorperazine (COMPAZINE) 10 MG tablet Take 1 tablet (10 mg total) by mouth every 6 (six) hours as needed (Nausea or vomiting). 30 tablet 6  . rosuvastatin (CRESTOR) 10 MG tablet Take 10 mg by mouth at bedtime.     . Tamsulosin HCl (FLOMAX) 0.4 MG CAPS Take 0.4 mg by mouth at bedtime.     [name] telmisartan (MICARDIS) 40 MG tablet Take 20 mg by mouth 2 (two) times daily.     [name] triamcinolone (KENALOG) 0.025 % cream Apply topically 2 (two) times daily. 30 g 0   No current facility-administered medications for this visit.    Facility-Administered Medications Ordered in Other Visits  Medication Dose Route Frequency Provider Last Rate Last Dose  . sodium chloride flush (NS) 0.9 % injection 10 mL  10 mL Intracatheter PRN Brunetta Genera, MD   10 mL at 08/24/16 1653    REVIEW OF SYSTEMS:    10 Point review of Systems was done is negative except as noted above.  PHYSICAL EXAMINATION: ECOG PERFORMANCE STATUS: 2 - Symptomatic, <50% confined to bed  . Vitals:   09/15/16 0918  BP: 137/75  Pulse: 95  Resp: 14  Temp: 98.6 F (37 C)   Filed Weights   09/15/16 0918  Weight: 223 lb 3.2 oz (101.2 kg)   .Body mass index is 30.27 kg/m.  GENERAL:alert, in no acute distress and comfortable SKIN: no acute rashes, no significant lesions EYES: conjunctiva are pink and non-injected, sclera anicteric OROPHARYNX: MMM, no exudates, no oropharyngeal erythema or ulceration NECK: supple, no JVD LYMPH:  no palpable lymphadenopathy in the cervical, axillary or inguinal regions LUNGS: clear to  auscultation b/l with normal respiratory effort HEART: regular rate & rhythm ABDOMEN:  normoactive  bowel sounds , non tender, not distended. Healed laparoscopic surgical scars Extremity: no pedal edema PSYCH: alert & oriented x 3 with fluent speech NEURO: no focal motor/sensory deficits  LABORATORY DATA:  I have reviewed the data as listed  . CBC Latest Ref Rng & Units 09/15/2016 09/07/2016 09/05/2016  WBC 4.0 - 10.3 10e3/uL 7.9 3.9(L) 2.4(L)  Hemoglobin 13.0 - 17.1 g/dL 8.6(L) 8.7(L) 7.9(L)  Hematocrit 38.4 - 49.9 % 27.2(L) 26.4(L) 23.4(L)  Platelets 140 - 400 10e3/uL 294 90(L) 42(L)  ANC 300  . CMP Latest Ref Rng & Units 09/15/2016 09/07/2016 09/04/2016  Glucose 70 - 140 mg/dl 160(H) 108 96  BUN 7.0 - 26.0 mg/dL 23.4 32.2(H) 26(H)  Creatinine 0.7 - 1.3 mg/dL 1.7(H) 1.9(H) 1.66(H)  Sodium 136 - 145 mEq/L 141 138 136  Potassium 3.5 - 5.1 mEq/L 4.4 4.6 4.1  Chloride 101 - 111 mmol/L - - 109  CO2 22 - 29 mEq/L 22 19(L) 21(L)  Calcium 8.4 - 10.4 mg/dL 9.0 8.2(L) 7.4(L)  Total Protein 6.4 - 8.3 g/dL 6.4 5.7(L) -  Total Bilirubin 0.20 - 1.20 mg/dL 0.26 0.26 -  Alkaline Phos 40 - 150 U/L 79 80 -  AST 5 - 34 U/L 37(H) 30 -  ALT 0 - 55 U/L 56(H) 65(H) -   . Lab Results  Component Value Date   LDH 302 (H) 08/11/2016   Component     Latest Ref Rng & Units 08/11/2016  Uric Acid, Serum     2.6 - 7.4 mg/dl 8.4 (H)  Hepatitis B Surface Ag     Negative Negative  Hep B Core Ab, Tot     Negative Negative  Hep C Virus Ab     0.0 - 0.9 s/co ratio <0.1    Myocardial perfusion scan 06/30/2016 Study Highlights     The left ventricular ejection fraction is moderately decreased (30-44%).  Nuclear stress EF: 40%.  There was no ST segment deviation noted during stress.  No T wave inversion was noted during stress.  Defect 1: There is a large defect of severe severity present in the basal inferolateral, basal anterolateral, mid inferolateral and mid anterolateral location.  Findings  consistent with prior myocardial infarction.  This is an intermediate risk study.   Intermediate risk stress nuclear study with a large scar in the left circumflex coronary artery distribution moderate reduction in left ventricular systolic function.   RADIOGRAPHIC STUDIES: I have personally reviewed the radiological images as listed and agreed with the findings in the report. Dg Chest 2 View  Result Date: 09/07/2016 CLINICAL DATA:  Abnormal chest x-ray previously. Patient presents the ER for low hemoglobin. EXAM: CHEST  2 VIEW COMPARISON:  09/01/2016 FINDINGS: Normal heart size and stable mediastinal contours. Status post CABG. Porta catheter on the right with tip at the SVC level. Postoperative changes at the right lung base. There is no edema, consolidation, effusion, or pneumothorax. No acute osseous finding. IMPRESSION: No evidence of active disease. Electronically Signed   By: Monte Fantasia M.D.   On: 09/07/2016 13:52   Ir US Guide Vasc Access Right  Result Date: 08/20/2016 INDICATION: 81 year old male with B-cell lymphoma EXAM: IMPLANTED PORT A CATH PLACEMENT WITH ULTRASOUND AND FLUOROSCOPIC GUIDANCE MEDICATIONS: 2.0 g Ancef; The antibiotic was administered within an appropriate time interval prior to skin puncture. ANESTHESIA/SEDATION: Moderate (conscious) sedation was employed during this procedure. A total of Versed 2.0 mg and Fentanyl 50 mcg was administered intravenously. Moderate Sedation Time: 18 minutes. The patient's level of  consciousness and vital signs were monitored continuously by radiology nursing throughout the procedure under my direct supervision. FLUOROSCOPY TIME:  Zero minutes, 6 seconds (1.3 mGy) COMPLICATIONS: None PROCEDURE: The procedure, risks, benefits, and alternatives were explained to the patient. Questions regarding the procedure were encouraged and answered. The patient understands and consents to the procedure. Ultrasound survey was performed with images stored  and sent to PACs. The right neck and chest was prepped with chlorhexidine, and draped in the usual sterile fashion using maximum barrier technique (cap and mask, sterile gown, sterile gloves, large sterile sheet, hand hygiene and cutaneous antiseptic). Antibiotic prophylaxis was provided with 2.0g Ancef administered IV one hour prior to skin incision. Local anesthesia was attained by infiltration with 1% lidocaine without epinephrine. Ultrasound demonstrated patency of the right internal jugular vein, and this was documented with an image. Under real-time ultrasound guidance, this vein was accessed with a 21 gauge micropuncture needle and image documentation was performed. A small dermatotomy was made at the access site with an 11 scalpel. A 0.018" wire was advanced into the SVC and used to estimate the length of the internal catheter. The access needle exchanged for a 88F micropuncture vascular sheath. The 0.018" wire was then removed and a 0.035" wire advanced into the IVC. An appropriate location for the subcutaneous reservoir was selected below the clavicle and an incision was made through the skin and underlying soft tissues. The subcutaneous tissues were then dissected using a combination of blunt and sharp surgical technique and a pocket was formed. A single lumen power injectable portacatheter was then tunneled through the subcutaneous tissues from the pocket to the dermatotomy and the port reservoir placed within the subcutaneous pocket. The venous access site was then serially dilated and a peel away vascular sheath placed over the wire. The wire was removed and the port catheter advanced into position under fluoroscopic guidance. The catheter tip is positioned in the cavoatrial junction. This was documented with a spot image. The portacatheter was then tested and found to flush and aspirate well. The port was flushed with saline followed by 100 units/mL heparinized saline. The pocket was then closed in two  layers using first subdermal inverted interrupted absorbable sutures followed by a running subcuticular suture. The epidermis was then sealed with Dermabond. The dermatotomy at the venous access site was also seal with Dermabond. Patient tolerated the procedure well and remained hemodynamically stable throughout. No complications encountered and no significant blood loss encountered IMPRESSION: Status post right IJ port catheter placement. Catheter ready for use. Signed, Dulcy Fanny. Earleen Newport, DO [facility] Radiology Electronically Signed   By: Corrie Mckusick D.O.   On: 08/20/2016 13:54   Dg Chest Port 1 View  Result Date: 09/01/2016 CLINICAL DATA:  Initial evaluation for acute weakness, fever, urinary retention. EXAM: PORTABLE CHEST 1 VIEW COMPARISON:  Prior radiograph from 07/01/2016. FINDINGS: Right-sided Port-A-Cath in place with tip overlying the distal SVC. Median sternotomy wires underlying CABG markers and surgical clips noted. Stable mild cardiomegaly. Mediastinal silhouette normal. Aortic atherosclerosis. Lungs mildly hypoinflated. Mild patchy and hazy right basilar opacity, favored to reflect atelectasis/ bronchovascular crowding, although superimposed infiltrate could be considered in the correct clinical setting. No other focal airspace disease. No pulmonary edema or pleural effusion. No pneumothorax. No acute osseus abnormality. IMPRESSION: 1. Shallow lung inflation with patchy and hazy right basilar opacity. Atelectasis/bronchovascular crowding is favored, although mild infiltrate could be considered in the correct clinical setting. 2. Stable cardiomegaly with sequelae of  prior CABG. Electronically Signed   By: Jeannine Boga M.D.   On: 09/01/2016 22:41   Ir Fluoro Guide Port Insertion Right  Result Date: 08/20/2016 INDICATION: 81 year old male with B-cell lymphoma EXAM: IMPLANTED PORT A CATH PLACEMENT WITH ULTRASOUND AND FLUOROSCOPIC GUIDANCE  MEDICATIONS: 2.0 g Ancef; The antibiotic was administered within an appropriate time interval prior to skin puncture. ANESTHESIA/SEDATION: Moderate (conscious) sedation was employed during this procedure. A total of Versed 2.0 mg and Fentanyl 50 mcg was administered intravenously. Moderate Sedation Time: 18 minutes. The patient's level of consciousness and vital signs were monitored continuously by radiology nursing throughout the procedure under my direct supervision. FLUOROSCOPY TIME:  Zero minutes, 6 seconds (1.3 mGy) COMPLICATIONS: None PROCEDURE: The procedure, risks, benefits, and alternatives were explained to the patient. Questions regarding the procedure were encouraged and answered. The patient understands and consents to the procedure. Ultrasound survey was performed with images stored and sent to PACs. The right neck and chest was prepped with chlorhexidine, and draped in the usual sterile fashion using maximum barrier technique (cap and mask, sterile gown, sterile gloves, large sterile sheet, hand hygiene and cutaneous antiseptic). Antibiotic prophylaxis was provided with 2.0g Ancef administered IV one hour prior to skin incision. Local anesthesia was attained by infiltration with 1% lidocaine without epinephrine. Ultrasound demonstrated patency of the right internal jugular vein, and this was documented with an image. Under real-time ultrasound guidance, this vein was accessed with a 21 gauge micropuncture needle and image documentation was performed. A small dermatotomy was made at the access site with an 11 scalpel. A 0.018" wire was advanced into the SVC and used to estimate the length of the internal catheter. The access needle exchanged for a 50F micropuncture vascular sheath. The 0.018" wire was then removed and a 0.035" wire advanced into the IVC. An appropriate location for the subcutaneous reservoir was selected below the clavicle and an incision was made through the skin and underlying soft  tissues. The subcutaneous tissues were then dissected using a combination of blunt and sharp surgical technique and a pocket was formed. A single lumen power injectable portacatheter was then tunneled through the subcutaneous tissues from the pocket to the dermatotomy and the port reservoir placed within the subcutaneous pocket. The venous access site was then serially dilated and a peel away vascular sheath placed over the wire. The wire was removed and the port catheter advanced into position under fluoroscopic guidance. The catheter tip is positioned in the cavoatrial junction. This was documented with a spot image. The portacatheter was then tested and found to flush and aspirate well. The port was flushed with saline followed by 100 units/mL heparinized saline. The pocket was then closed in two layers using first subdermal inverted interrupted absorbable sutures followed by a running subcuticular suture. The epidermis was then sealed with Dermabond. The dermatotomy at the venous access site was also seal with Dermabond. Patient tolerated the procedure well and remained hemodynamically stable throughout. No complications encountered and no significant blood loss encountered IMPRESSION: Status post right IJ port catheter placement. Catheter ready for use. Signed, Dulcy Fanny. Earleen Newport, DO [facility] Radiology Electronically Signed   By: Corrie Mckusick D.O.   On: 08/20/2016 13:54   CT abdomen/pelvis (06/15/2016) IMPRESSION: 1. Small bowel lesion is identified within the left upper quadrant the abdomen. Finding is worrisome for non obstructing small bowel neoplasm. 2. No evidence for metastatic adenopathy or distant metastatic disease. 3. Aortic atherosclerosis and infrarenal abdominal aortic aneurysm.  Recommend followup by ultrasound in 3 years. This recommendation follows ACR consensus guidelines: White Paper of the ACR Incidental Findings Committee II on  Vascular Findings. [facility] Radiol 2013; 10:789-794   Electronically Signed   By: Kerby Moors M.D.   On: 06/15/2016 15:14  ASSESSMENT & PLAN:   81 yo male with multiple medical co-morbidities including cardiac co-morbidities, IPF, vascular on chronic immunosupression with   1) Newly diagnosed Diffuse large B-cell lymphoma involving the small intestine (germinal cell phenotype) with complete thickness involvement of bowel and abutting serosa. Patient on labs today has elevated LDH of 305 and hypercalcemia concerning for possible presence of more extensive DLBCL  PET/CT scan shows extensive involvement with DLBCL, hypermetabolic nodal involvement of the right axilla, pericardial space, porta hepatis/mesenteric root, and retroperitoneum, there is extensive involvement of the liver as well as peritoneal involvement along the liver surface, splenic surface, and upper omentum. 2. Hypermetabolic bony lesions in the left pelvis compatible with malignant involvement. 3. There is a focus of hypermetabolic activity along the anterolateral wall of the left ventricle.   s/p 1 cycle of R-CEOP complicated by neutropenic fever and E.coli/pseudomonal bacteremia/HCAP- resolved now. Significant chemotherapy related thrombocytopenia - now resolved.  2) chronic kidney disease stage III baseline creatinine about 2 3) coronary disease status post PCI and CABG - had a myocardial perfusion imaging scan on 06/30/2016 that showed a moderately decreased ejection fraction of 30-44%- this would likely include anthracycline use. 4) history of pulmonary and renal vasculitis on chronic Imuran  5) Neutropenia -resolved 6) thrombocytopenia due to chemotherapy -resolved  Plan -Patient is here for his2nd cycle of R-CEOP. -after discussing pros and cons and GOC he wants to proceed with planned ctx -will dose reduce etoposide to 25mg /m2 and Cyclophosphamide to 500mg /m2 -Neulasta shot -discontinue Allopurinol -hold  statins -he will need close f/u with Dr Meredeth Ide for mx of diuretics.   Chemotherapy today PRBC transfuse in 5 days (2 units) Labs D10 RTC with Dr Irene Limbo in 3 weeks C3D1 of chemotherapy with   All of the patients questions were answered with apparent satisfaction. The patient knows to call the clinic with any problems, questions or concerns.  I spent 20 minutes counseling the patient face to face. The total time spent in the appointment was 25 minutes and more than 50% was on counseling and direct patient cares.    Sullivan Lone MD [place] AAHIVMS [facility] Hematology/Oncology Physician [facility]  (Office):       [phone] (Work cell):  [phone] (Fax):           [phone]

## 2016-09-17 ENCOUNTER — Ambulatory Visit (HOSPITAL_COMMUNITY)
Admission: RE | Admit: 2016-09-17 | Discharge: 2016-09-17 | Disposition: A | Discharge status: Home / Self Care | Payer: Medicare Other | Source: Ambulatory Visit | Attending: Hematology | Admitting: Hematology

## 2016-09-17 ENCOUNTER — Ambulatory Visit (HOSPITAL_BASED_OUTPATIENT_CLINIC_OR_DEPARTMENT_OTHER): Payer: Medicare Other

## 2016-09-17 ENCOUNTER — Other Ambulatory Visit: Payer: Self-pay | Admitting: *Deleted

## 2016-09-17 ENCOUNTER — Other Ambulatory Visit: Payer: Medicare Other

## 2016-09-17 VITALS — BP 141/75 | HR 66 | Temp 97.9°F | Resp 18

## 2016-09-17 DIAGNOSIS — Z7189 Other specified counseling: Secondary | ICD-10-CM

## 2016-09-17 DIAGNOSIS — C8338 Diffuse large B-cell lymphoma, lymph nodes of multiple sites: Secondary | ICD-10-CM | POA: Diagnosis not present

## 2016-09-17 DIAGNOSIS — Z5189 Encounter for other specified aftercare: Secondary | ICD-10-CM | POA: Diagnosis not present

## 2016-09-17 DIAGNOSIS — D649 Anemia, unspecified: Secondary | ICD-10-CM

## 2016-09-17 DIAGNOSIS — Z5111 Encounter for antineoplastic chemotherapy: Secondary | ICD-10-CM

## 2016-09-17 DIAGNOSIS — D702 Other drug-induced agranulocytosis: Secondary | ICD-10-CM

## 2016-09-17 LAB — CBC & DIFF AND RETIC
BASO%: 0 % (ref 0.0–2.0)
Basophils Absolute: 0 10*3/uL (ref 0.0–0.1)
EOS ABS: 0 10*3/uL (ref 0.0–0.5)
EOS%: 0 % (ref 0.0–7.0)
HCT: 26.4 % — ABNORMAL LOW (ref 38.4–49.9)
HEMOGLOBIN: 8.4 g/dL — AB (ref 13.0–17.1)
Immature Retic Fract: 3.2 % (ref 3.00–10.60)
LYMPH#: 0.4 10*3/uL — AB (ref 0.9–3.3)
LYMPH%: 3.4 % — AB (ref 14.0–49.0)
MCH: 33.2 pg (ref 27.2–33.4)
MCHC: 31.8 g/dL — AB (ref 32.0–36.0)
MCV: 104.3 fL — AB (ref 79.3–98.0)
MONO#: 0.5 10*3/uL (ref 0.1–0.9)
MONO%: 3.9 % (ref 0.0–14.0)
NEUT%: 92.7 % — ABNORMAL HIGH (ref 39.0–75.0)
NEUTROS ABS: 11.4 10*3/uL — AB (ref 1.5–6.5)
Platelets: 284 10*3/uL (ref 140–400)
RBC: 2.53 10*6/uL — AB (ref 4.20–5.82)
RDW: 19.1 % — ABNORMAL HIGH (ref 11.0–14.6)
RETIC %: 2.94 % — AB (ref 0.80–1.80)
Retic Ct Abs: 74.38 10*3/uL (ref 34.80–93.90)
WBC: 12.3 10*3/uL — AB (ref 4.0–10.3)

## 2016-09-17 LAB — COMPREHENSIVE METABOLIC PANEL
ALT: 65 U/L — ABNORMAL HIGH (ref 0–55)
ANION GAP: 10 meq/L (ref 3–11)
AST: 40 U/L — ABNORMAL HIGH (ref 5–34)
Albumin: 2.8 g/dL — ABNORMAL LOW (ref 3.5–5.0)
Alkaline Phosphatase: 75 U/L (ref 40–150)
BILIRUBIN TOTAL: 0.38 mg/dL (ref 0.20–1.20)
BUN: 46.1 mg/dL — ABNORMAL HIGH (ref 7.0–26.0)
CHLORIDE: 108 meq/L (ref 98–109)
CO2: 22 meq/L (ref 22–29)
Calcium: 8.9 mg/dL (ref 8.4–10.4)
Creatinine: 1.8 mg/dL — ABNORMAL HIGH (ref 0.7–1.3)
EGFR: 34 mL/min/{1.73_m2} — AB (ref >90)
GLUCOSE: 121 mg/dL (ref 70–140)
POTASSIUM: 5 meq/L (ref 3.5–5.1)
SODIUM: 140 meq/L (ref 136–145)
TOTAL PROTEIN: 6.3 g/dL — AB (ref 6.4–8.3)

## 2016-09-17 LAB — LACTATE DEHYDROGENASE: LDH: 249 U/L — AB (ref 125–245)

## 2016-09-17 LAB — PREPARE RBC (CROSSMATCH)

## 2016-09-17 MED ORDER — SODIUM CHLORIDE 0.9% FLUSH
10.0000 mL | INTRAVENOUS | Status: DC | PRN
  Administered 2016-09-17: 10 mL
  Filled 2016-09-17: qty 10

## 2016-09-17 MED ORDER — DEXAMETHASONE SODIUM PHOSPHATE 10 MG/ML IJ SOLN
10.0000 mg | Freq: Once | INTRAMUSCULAR | Status: AC
  Administered 2016-09-17: 10 mg via INTRAVENOUS

## 2016-09-17 MED ORDER — PROCHLORPERAZINE MALEATE 10 MG PO TABS
ORAL_TABLET | ORAL | Status: AC
  Filled 2016-09-17: qty 1

## 2016-09-17 MED ORDER — DIPHENHYDRAMINE HCL 25 MG PO CAPS
50.0000 mg | ORAL_CAPSULE | Freq: Once | ORAL | Status: DC
  Administered 2016-09-17: 50 mg via ORAL

## 2016-09-17 MED ORDER — HEPARIN SOD (PORK) LOCK FLUSH 100 UNIT/ML IV SOLN
500.0000 [IU] | Freq: Once | INTRAVENOUS | Status: AC | PRN
  Administered 2016-09-17: 500 [IU]
  Filled 2016-09-17: qty 5

## 2016-09-17 MED ORDER — ACETAMINOPHEN 325 MG PO TABS
ORAL_TABLET | ORAL | Status: AC
  Filled 2016-09-17: qty 2

## 2016-09-17 MED ORDER — PEGFILGRASTIM 6 MG/0.6ML ~~LOC~~ PSKT
6.0000 mg | PREFILLED_SYRINGE | Freq: Once | SUBCUTANEOUS | Status: AC
  Administered 2016-09-17: 6 mg via SUBCUTANEOUS
  Filled 2016-09-17: qty 0.6

## 2016-09-17 MED ORDER — DEXAMETHASONE SODIUM PHOSPHATE 10 MG/ML IJ SOLN
INTRAMUSCULAR | Status: AC
  Filled 2016-09-17: qty 1

## 2016-09-17 MED ORDER — DIPHENHYDRAMINE HCL 25 MG PO CAPS
ORAL_CAPSULE | ORAL | Status: AC
  Filled 2016-09-17: qty 2

## 2016-09-17 MED ORDER — ACETAMINOPHEN 325 MG PO TABS
650.0000 mg | ORAL_TABLET | Freq: Once | ORAL | Status: DC
  Administered 2016-09-17: 650 mg via ORAL

## 2016-09-17 MED ORDER — SODIUM CHLORIDE 0.9 % IV SOLN
Freq: Once | INTRAVENOUS | Status: AC
  Administered 2016-09-17: 15:00:00 via INTRAVENOUS

## 2016-09-17 MED ORDER — SODIUM CHLORIDE 0.9 % IV SOLN: 375.0000 mg/m2 | Freq: Once | INTRAVENOUS | Status: DC

## 2016-09-17 MED ORDER — PROCHLORPERAZINE MALEATE 10 MG PO TABS
10.0000 mg | ORAL_TABLET | Freq: Once | ORAL | Status: AC
  Administered 2016-09-17: 10 mg via ORAL

## 2016-09-17 MED ORDER — SODIUM CHLORIDE 0.9 % IV SOLN
25.0000 mg/m2 | Freq: Once | INTRAVENOUS | Status: AC
  Administered 2016-09-17: 60 mg via INTRAVENOUS
  Filled 2016-09-17: qty 3

## 2016-09-17 NOTE — Progress Notes (Signed)
Pt ctn 1.8 okay per note from Dr. Irene Limbo 09/15/16. Pt baseline ctn 2.0.

## 2016-09-17 NOTE — Patient Instructions (Addendum)
[facility] Discharge Instructions for Patients Receiving Chemotherapy  Today you received the following chemotherapy agents:  Etoposide  To help prevent nausea and vomiting after your treatment, we encourage you to take your nausea medication as ordered per MD.    If you develop nausea and vomiting that is not controlled by your nausea medication, call the clinic.   BELOW ARE SYMPTOMS THAT SHOULD BE REPORTED IMMEDIATELY:  *FEVER GREATER THAN 100.5 F  *CHILLS WITH OR WITHOUT FEVER  NAUSEA AND VOMITING THAT IS NOT CONTROLLED WITH YOUR NAUSEA MEDICATION  *UNUSUAL SHORTNESS OF BREATH  *UNUSUAL BRUISING OR BLEEDING  TENDERNESS IN MOUTH AND THROAT WITH OR WITHOUT PRESENCE OF ULCERS  *URINARY PROBLEMS  *BOWEL PROBLEMS  UNUSUAL RASH Items with * indicate a potential emergency and should be followed up as soon as possible.  Feel free to call the clinic you have any questions or concerns. The clinic phone number is [phone].  Please show the CHEMO ALERT CARD at check-in to the Emergency Department and triage nurse.   

## 2016-09-18 ENCOUNTER — Ambulatory Visit: Payer: Medicare Other

## 2016-09-18 DIAGNOSIS — D649 Anemia, unspecified: Secondary | ICD-10-CM

## 2016-09-18 MED ORDER — ACETAMINOPHEN 325 MG PO TABS
650.0000 mg | ORAL_TABLET | Freq: Once | ORAL | Status: AC
  Administered 2016-09-18: 650 mg via ORAL

## 2016-09-18 MED ORDER — HEPARIN SOD (PORK) LOCK FLUSH 100 UNIT/ML IV SOLN
250.0000 [IU] | INTRAVENOUS | Status: DC | PRN
  Filled 2016-09-18: qty 5

## 2016-09-18 MED ORDER — SODIUM CHLORIDE 0.9 % IV SOLN
250.0000 mL | Freq: Once | INTRAVENOUS | Status: AC
  Administered 2016-09-18: 250 mL via INTRAVENOUS

## 2016-09-18 MED ORDER — SODIUM CHLORIDE 0.9% FLUSH
3.0000 mL | INTRAVENOUS | Status: DC | PRN
  Filled 2016-09-18: qty 10

## 2016-09-18 MED ORDER — DIPHENHYDRAMINE HCL 25 MG PO CAPS
25.0000 mg | ORAL_CAPSULE | Freq: Once | ORAL | Status: AC
  Administered 2016-09-18: 25 mg via ORAL

## 2016-09-18 MED ORDER — FUROSEMIDE 10 MG/ML IJ SOLN: 20.0000 mg | Freq: Once | INTRAMUSCULAR | Status: DC

## 2016-09-18 MED ORDER — HEPARIN SOD (PORK) LOCK FLUSH 100 UNIT/ML IV SOLN
500.0000 [IU] | Freq: Every day | INTRAVENOUS | Status: AC | PRN
  Administered 2016-09-18: 500 [IU]
  Filled 2016-09-18: qty 5

## 2016-09-18 MED ORDER — SODIUM CHLORIDE 0.9% FLUSH
10.0000 mL | INTRAVENOUS | Status: AC | PRN
  Administered 2016-09-18: 10 mL
  Filled 2016-09-18: qty 10

## 2016-09-18 MED ORDER — ACETAMINOPHEN 325 MG PO TABS
ORAL_TABLET | ORAL | Status: AC
  Filled 2016-09-18: qty 2

## 2016-09-18 MED ORDER — DIPHENHYDRAMINE HCL 25 MG PO CAPS
ORAL_CAPSULE | ORAL | Status: AC
  Filled 2016-09-18: qty 1

## 2016-09-18 NOTE — Patient Instructions (Signed)
Blood Transfusion , Adult A blood transfusion is a procedure in which you receive donated blood, including plasma, platelets, and red blood cells, through an IV tube. You may need a blood transfusion because of illness, surgery, or injury. The blood may come from a donor. You may also be able to donate blood for yourself (autologous blood donation) before a surgery if you know that you might require a blood transfusion. The blood given in a transfusion is made up of different types of cells. You may receive:  Red blood cells. These carry oxygen to the cells in the body.  White blood cells. These help you fight infections.  Platelets. These help your blood to clot.  Plasma. This is the liquid part of your blood and it helps with fluid imbalances. If you have hemophilia or another clotting disorder, you may also receive other types of blood products. Tell a health care provider about:  Any allergies you have.  All medicines you are taking, including vitamins, herbs, eye drops, creams, and over-the-counter medicines.  Any problems you or family members have had with anesthetic medicines.  Any blood disorders you have.  Any surgeries you have had.  Any medical conditions you have, including any recent fever or cold symptoms.  Whether you are pregnant or may be pregnant.  Any previous reactions you have had during a blood transfusion. What are the risks? Generally, this is a safe procedure. However, problems may occur, including:  Having an allergic reaction to something in the donated blood. Hives and itching may be symptoms of this type of reaction.  Fever. This may be a reaction to the white blood cells in the transfused blood. Nausea or chest pain may accompany a fever.  Iron overload. This can happen from having many transfusions.  Transfusion-related acute lung injury (TRALI). This is a rare reaction that causes lung damage. The cause is not known.TRALI can occur within hours  of a transfusion or several days later.  Sudden (acute) or delayed hemolytic reactions. This happens if your blood does not match the cells in your transfusion. Your body's defense system (immune system) may try to attack the new cells. This complication is rare. The symptoms include fever, chills, nausea, and low back pain or chest pain.  Infection or disease transmission. This is rare. What happens before the procedure?  You will have a blood test to determine your blood type. This is necessary to know what kind of blood your body will accept and to match it to the donor blood.  If you are going to have a planned surgery, you may be able to do an autologous blood donation. This may be done in case you need to have a transfusion.  If you have had an allergic reaction to a transfusion in the past, you may be given medicine to help prevent a reaction. This medicine may be given to you by mouth or through an IV tube.  You will have your temperature, blood pressure, and pulse monitored before the transfusion.  Follow instructions from your health care provider about eating and drinking restrictions.  Ask your health care provider about:  Changing or stopping your regular medicines. This is especially important if you are taking diabetes medicines or blood thinners.  Taking medicines such as aspirin and ibuprofen. These medicines can thin your blood. Do not take these medicines before your procedure if your health care provider instructs you not to. What happens during the procedure?  An IV tube will be   inserted into one of your veins.  The bag of donated blood will be attached to your IV tube. The blood will then enter through your vein.  Your temperature, blood pressure, and pulse will be monitored regularly during the transfusion. This monitoring is done to detect early signs of a transfusion reaction.  If you have any signs or symptoms of a reaction, your transfusion will be stopped and  you may be given medicine.  When the transfusion is complete, your IV tube will be removed.  Pressure may be applied to the IV site for a few minutes.  A bandage (dressing) will be applied. The procedure may vary among health care providers and hospitals. What happens after the procedure?  Your temperature, blood pressure, heart rate, breathing rate, and blood oxygen level will be monitored often.  Your blood may be tested to see how you are responding to the transfusion.  You may be warmed with fluids or blankets to maintain a normal body temperature. Summary  A blood transfusion is a procedure in which you receive donated blood, including plasma, platelets, and red blood cells, through an IV tube.  Your temperature, blood pressure, and pulse will be monitored before, during, and after the transfusion.  Your blood may be tested after the transfusion to see how your body has responded. This information is not intended to replace advice given to you by your health care provider. Make sure you discuss any questions you have with your health care provider. Document Released: 04/30/2000 Document Revised: 01/29/2016 Document Reviewed: 01/29/2016 Elsevier Interactive Patient Education  2017 Elsevier Inc.  

## 2016-09-20 LAB — BPAM RBC
BLOOD PRODUCT EXPIRATION DATE: 201805232359
Blood Product Expiration Date: 201805232359
ISSUE DATE / TIME: 201805050847
ISSUE DATE / TIME: 201805050847
UNIT TYPE AND RH: 6200
Unit Type and Rh: 6200

## 2016-09-20 LAB — TYPE AND SCREEN
ABO/RH(D): A POS
ANTIBODY SCREEN: NEGATIVE
UNIT DIVISION: 0
Unit division: 0

## 2016-09-22 ENCOUNTER — Other Ambulatory Visit: Payer: Self-pay | Admitting: Cardiovascular Disease

## 2016-09-24 ENCOUNTER — Other Ambulatory Visit (HOSPITAL_BASED_OUTPATIENT_CLINIC_OR_DEPARTMENT_OTHER): Payer: Medicare Other

## 2016-09-24 ENCOUNTER — Ambulatory Visit (HOSPITAL_BASED_OUTPATIENT_CLINIC_OR_DEPARTMENT_OTHER): Payer: Medicare Other

## 2016-09-24 DIAGNOSIS — C8330 Diffuse large B-cell lymphoma, unspecified site: Secondary | ICD-10-CM

## 2016-09-24 DIAGNOSIS — Z95828 Presence of other vascular implants and grafts: Secondary | ICD-10-CM

## 2016-09-24 DIAGNOSIS — C8339 Diffuse large B-cell lymphoma, extranodal and solid organ sites: Secondary | ICD-10-CM

## 2016-09-24 DIAGNOSIS — Z7189 Other specified counseling: Secondary | ICD-10-CM

## 2016-09-24 LAB — COMPREHENSIVE METABOLIC PANEL
ALBUMIN: 3.1 g/dL — AB (ref 3.5–5.0)
ALK PHOS: 94 U/L (ref 40–150)
ALT: 34 U/L (ref 0–55)
AST: 16 U/L (ref 5–34)
Anion Gap: 11 mEq/L (ref 3–11)
BUN: 39.1 mg/dL — AB (ref 7.0–26.0)
CALCIUM: 8.8 mg/dL (ref 8.4–10.4)
CO2: 25 mEq/L (ref 22–29)
Chloride: 104 mEq/L (ref 98–109)
Creatinine: 1.8 mg/dL — ABNORMAL HIGH (ref 0.7–1.3)
EGFR: 34 mL/min/{1.73_m2} — AB (ref >90)
GLUCOSE: 127 mg/dL (ref 70–140)
POTASSIUM: 4.1 meq/L (ref 3.5–5.1)
SODIUM: 139 meq/L (ref 136–145)
TOTAL PROTEIN: 6.5 g/dL (ref 6.4–8.3)
Total Bilirubin: 0.5 mg/dL (ref 0.20–1.20)

## 2016-09-24 LAB — CBC & DIFF AND RETIC
BASO%: 0.6 % (ref 0.0–2.0)
BASO%: 0.5 % (ref 0.0–2.0)
BASOS ABS: 0 10*3/uL (ref 0.0–0.1)
EOS%: 1 % (ref 0.0–7.0)
EOS%: 0.9 % (ref 0.0–7.0)
Eosinophils Absolute: 0.1 10*3/uL (ref 0.0–0.5)
HEMATOCRIT: 34.7 % — AB (ref 38.4–49.9)
HGB: 11.1 g/dL — ABNORMAL LOW (ref 13.0–17.1)
Immature Retic Fract: 39 % — ABNORMAL HIGH (ref 3.00–10.60)
LYMPH%: 12.9 % — AB (ref 14.0–49.0)
MCH: 31.7 pg (ref 27.2–33.4)
MCHC: 31.9 g/dL — ABNORMAL LOW (ref 32.0–36.0)
MCHC: 32 g/dL (ref 32.0–36.0)
MCV: 99.1 fL — ABNORMAL HIGH (ref 79.3–98.0)
MONO#: 1.2 10*3/uL — ABNORMAL HIGH (ref 0.1–0.9)
MONO%: 14.8 % — ABNORMAL HIGH (ref 0.0–14.0)
NEUT#: 5.6 10*3/uL (ref 1.5–6.5)
NEUT%: 70.9 % (ref 39.0–75.0)
Platelets: 136 10*3/uL — ABNORMAL LOW (ref 140–400)
Platelets: 142 10*3/uL (ref 140–400)
RBC: 3.28 10*6/uL — ABNORMAL LOW (ref 4.20–5.82)
RBC: 3.5 10*6/uL — AB (ref 4.20–5.82)
RDW: 19.3 % — ABNORMAL HIGH (ref 11.0–14.6)
RDW: 19.7 % — ABNORMAL HIGH (ref 11.0–14.6)
RETIC CT ABS: 93.8 10*3/uL (ref 34.80–93.90)
Retic %: 2.59 % — ABNORMAL HIGH (ref 0.80–1.80)
Retic %: 2.68 % — ABNORMAL HIGH (ref 0.80–1.80)
Retic Ct Abs: 84.95 10*3/uL (ref 34.80–93.90)
WBC: 6.8 10*3/uL (ref 4.0–10.3)
WBC: 8 10*3/uL (ref 4.0–10.3)
lymph#: 0.8 10*3/uL — ABNORMAL LOW (ref 0.9–3.3)
lymph#: 1 10*3/uL (ref 0.9–3.3)

## 2016-09-24 MED ORDER — HEPARIN SOD (PORK) LOCK FLUSH 100 UNIT/ML IV SOLN
500.0000 [IU] | Freq: Once | INTRAVENOUS | Status: AC
  Administered 2016-09-24: 500 [IU]
  Filled 2016-09-24: qty 5

## 2016-09-24 MED ORDER — SODIUM CHLORIDE 0.9% FLUSH
10.0000 mL | Freq: Once | INTRAVENOUS | Status: AC
  Administered 2016-09-24: 10 mL
  Filled 2016-09-24: qty 10

## 2016-09-24 NOTE — Patient Instructions (Signed)

## 2016-10-04 ENCOUNTER — Ambulatory Visit: Payer: Medicare Other | Admitting: Pulmonary Disease

## 2016-10-05 ENCOUNTER — Other Ambulatory Visit: Payer: Self-pay | Admitting: Cardiovascular Disease

## 2016-10-05 ENCOUNTER — Encounter: Payer: Self-pay | Admitting: Hematology

## 2016-10-05 ENCOUNTER — Ambulatory Visit (HOSPITAL_BASED_OUTPATIENT_CLINIC_OR_DEPARTMENT_OTHER): Payer: Medicare Other

## 2016-10-05 ENCOUNTER — Ambulatory Visit: Payer: Medicare Other

## 2016-10-05 ENCOUNTER — Ambulatory Visit (HOSPITAL_BASED_OUTPATIENT_CLINIC_OR_DEPARTMENT_OTHER): Payer: Medicare Other | Admitting: Hematology

## 2016-10-05 ENCOUNTER — Other Ambulatory Visit (HOSPITAL_BASED_OUTPATIENT_CLINIC_OR_DEPARTMENT_OTHER): Payer: Medicare Other

## 2016-10-05 ENCOUNTER — Telehealth: Payer: Self-pay | Admitting: Hematology

## 2016-10-05 VITALS — BP 122/66 | HR 77 | Temp 97.7°F | Resp 16

## 2016-10-05 VITALS — BP 126/73 | HR 99 | Temp 98.1°F | Resp 19 | Ht 72.0 in | Wt 223.1 lb

## 2016-10-05 DIAGNOSIS — Z5111 Encounter for antineoplastic chemotherapy: Secondary | ICD-10-CM | POA: Diagnosis not present

## 2016-10-05 DIAGNOSIS — C8338 Diffuse large B-cell lymphoma, lymph nodes of multiple sites: Secondary | ICD-10-CM

## 2016-10-05 DIAGNOSIS — N183 Chronic kidney disease, stage 3 (moderate): Secondary | ICD-10-CM | POA: Diagnosis not present

## 2016-10-05 DIAGNOSIS — C8330 Diffuse large B-cell lymphoma, unspecified site: Secondary | ICD-10-CM

## 2016-10-05 DIAGNOSIS — Z7189 Other specified counseling: Secondary | ICD-10-CM

## 2016-10-05 DIAGNOSIS — Z5112 Encounter for antineoplastic immunotherapy: Secondary | ICD-10-CM

## 2016-10-05 DIAGNOSIS — Z95828 Presence of other vascular implants and grafts: Secondary | ICD-10-CM

## 2016-10-05 LAB — CBC & DIFF AND RETIC
BASO%: 0.3 % (ref 0.0–2.0)
Basophils Absolute: 0 10*3/uL (ref 0.0–0.1)
EOS%: 0.8 % (ref 0.0–7.0)
Eosinophils Absolute: 0.1 10*3/uL (ref 0.0–0.5)
HCT: 32.3 % — ABNORMAL LOW (ref 38.4–49.9)
HGB: 10.1 g/dL — ABNORMAL LOW (ref 13.0–17.1)
Immature Retic Fract: 14.6 % — ABNORMAL HIGH (ref 3.00–10.60)
LYMPH#: 0.7 10*3/uL — AB (ref 0.9–3.3)
LYMPH%: 7.1 % — AB (ref 14.0–49.0)
MCH: 31.4 pg (ref 27.2–33.4)
MCHC: 31.3 g/dL — AB (ref 32.0–36.0)
MCV: 100.3 fL — AB (ref 79.3–98.0)
MONO#: 0.3 10*3/uL (ref 0.1–0.9)
MONO%: 3 % (ref 0.0–14.0)
NEUT%: 88.8 % — AB (ref 39.0–75.0)
NEUTROS ABS: 8.2 10*3/uL — AB (ref 1.5–6.5)
PLATELETS: 170 10*3/uL (ref 140–400)
RBC: 3.22 10*6/uL — AB (ref 4.20–5.82)
RDW: 19.7 % — ABNORMAL HIGH (ref 11.0–14.6)
Retic %: 2.93 % — ABNORMAL HIGH (ref 0.80–1.80)
Retic Ct Abs: 94.35 10*3/uL — ABNORMAL HIGH (ref 34.80–93.90)
WBC: 9.3 10*3/uL (ref 4.0–10.3)

## 2016-10-05 LAB — LACTATE DEHYDROGENASE: LDH: 232 U/L (ref 125–245)

## 2016-10-05 LAB — COMPREHENSIVE METABOLIC PANEL
ALT: 22 U/L (ref 0–55)
ANION GAP: 9 meq/L (ref 3–11)
AST: 16 U/L (ref 5–34)
Albumin: 3.1 g/dL — ABNORMAL LOW (ref 3.5–5.0)
Alkaline Phosphatase: 82 U/L (ref 40–150)
BUN: 26.3 mg/dL — ABNORMAL HIGH (ref 7.0–26.0)
CHLORIDE: 107 meq/L (ref 98–109)
CO2: 23 meq/L (ref 22–29)
Calcium: 8.9 mg/dL (ref 8.4–10.4)
Creatinine: 1.8 mg/dL — ABNORMAL HIGH (ref 0.7–1.3)
EGFR: 34 mL/min/{1.73_m2} — AB (ref >90)
Glucose: 194 mg/dl — ABNORMAL HIGH (ref 70–140)
POTASSIUM: 4.3 meq/L (ref 3.5–5.1)
SODIUM: 139 meq/L (ref 136–145)
Total Bilirubin: 0.44 mg/dL (ref 0.20–1.20)
Total Protein: 6.1 g/dL — ABNORMAL LOW (ref 6.4–8.3)

## 2016-10-05 MED ORDER — DEXAMETHASONE SODIUM PHOSPHATE 10 MG/ML IJ SOLN
INTRAMUSCULAR | Status: AC
  Filled 2016-10-05: qty 1

## 2016-10-05 MED ORDER — SODIUM CHLORIDE 0.9% FLUSH
10.0000 mL | INTRAVENOUS | Status: DC | PRN
  Administered 2016-10-05: 10 mL
  Filled 2016-10-05: qty 10

## 2016-10-05 MED ORDER — DEXAMETHASONE SODIUM PHOSPHATE 10 MG/ML IJ SOLN
10.0000 mg | Freq: Once | INTRAMUSCULAR | Status: AC
  Administered 2016-10-05: 10 mg via INTRAVENOUS

## 2016-10-05 MED ORDER — SODIUM CHLORIDE 0.9 % IV SOLN
Freq: Once | INTRAVENOUS | Status: AC
  Administered 2016-10-05: 10:00:00 via INTRAVENOUS

## 2016-10-05 MED ORDER — SODIUM CHLORIDE 0.9 % IV SOLN
25.0000 mg/m2 | Freq: Once | INTRAVENOUS | Status: AC
  Administered 2016-10-05: 60 mg via INTRAVENOUS
  Filled 2016-10-05: qty 3

## 2016-10-05 MED ORDER — SODIUM CHLORIDE 0.9% FLUSH
10.0000 mL | Freq: Once | INTRAVENOUS | Status: AC
  Administered 2016-10-05: 10 mL
  Filled 2016-10-05: qty 10

## 2016-10-05 MED ORDER — PALONOSETRON HCL INJECTION 0.25 MG/5ML
INTRAVENOUS | Status: AC
  Filled 2016-10-05: qty 5

## 2016-10-05 MED ORDER — PALONOSETRON HCL INJECTION 0.25 MG/5ML
0.2500 mg | Freq: Once | INTRAVENOUS | Status: AC
  Administered 2016-10-05: 0.25 mg via INTRAVENOUS

## 2016-10-05 MED ORDER — VINCRISTINE SULFATE CHEMO INJECTION 1 MG/ML
2.0000 mg | Freq: Once | INTRAVENOUS | Status: AC
  Administered 2016-10-05: 2 mg via INTRAVENOUS
  Filled 2016-10-05: qty 2

## 2016-10-05 MED ORDER — DIPHENHYDRAMINE HCL 25 MG PO CAPS
ORAL_CAPSULE | ORAL | Status: AC
  Filled 2016-10-05: qty 2

## 2016-10-05 MED ORDER — ACETAMINOPHEN 325 MG PO TABS
ORAL_TABLET | ORAL | Status: AC
Start: 2016-10-05 — End: 2016-10-05
  Filled 2016-10-05: qty 2

## 2016-10-05 MED ORDER — HEPARIN SOD (PORK) LOCK FLUSH 100 UNIT/ML IV SOLN
500.0000 [IU] | Freq: Once | INTRAVENOUS | Status: AC | PRN
  Administered 2016-10-05: 500 [IU]
  Filled 2016-10-05: qty 5

## 2016-10-05 MED ORDER — SODIUM CHLORIDE 0.9 % IV SOLN
500.0000 mg/m2 | Freq: Once | INTRAVENOUS | Status: AC
  Administered 2016-10-05: 1120 mg via INTRAVENOUS
  Filled 2016-10-05: qty 56

## 2016-10-05 MED ORDER — DIPHENHYDRAMINE HCL 25 MG PO CAPS
50.0000 mg | ORAL_CAPSULE | Freq: Once | ORAL | Status: AC
  Administered 2016-10-05: 50 mg via ORAL

## 2016-10-05 MED ORDER — ACETAMINOPHEN 325 MG PO TABS
650.0000 mg | ORAL_TABLET | Freq: Once | ORAL | Status: AC
  Administered 2016-10-05: 650 mg via ORAL

## 2016-10-05 MED ORDER — SODIUM CHLORIDE 0.9 % IV SOLN
375.0000 mg/m2 | Freq: Once | INTRAVENOUS | Status: AC
  Administered 2016-10-05: 800 mg via INTRAVENOUS
  Filled 2016-10-05: qty 50

## 2016-10-05 NOTE — Telephone Encounter (Signed)
Gave patient AVS and calender per 5/22 los. [facility] to contact patient with PET schedule.

## 2016-10-05 NOTE — Patient Instructions (Addendum)
[facility] Discharge Instructions for Patients Receiving Chemotherapy  Today you received the following chemotherapy agents Vincristine, Cytoxan, Etoposide and Rituxan   To help prevent nausea and vomiting after your treatment, we encourage you to take your nausea medication as directed. No Zofran for 3 days. Take Compazine instead.    If you develop nausea and vomiting that is not controlled by your nausea medication, call the clinic.   BELOW ARE SYMPTOMS THAT SHOULD BE REPORTED IMMEDIATELY:  *FEVER GREATER THAN 100.5 F  *CHILLS WITH OR WITHOUT FEVER  NAUSEA AND VOMITING THAT IS NOT CONTROLLED WITH YOUR NAUSEA MEDICATION  *UNUSUAL SHORTNESS OF BREATH  *UNUSUAL BRUISING OR BLEEDING  TENDERNESS IN MOUTH AND THROAT WITH OR WITHOUT PRESENCE OF ULCERS  *URINARY PROBLEMS  *BOWEL PROBLEMS  UNUSUAL RASH Items with * indicate a potential emergency and should be followed up as soon as possible.  Feel free to call the clinic you have any questions or concerns. The clinic phone number is [phone].  Please show the [place] at check-in to the Emergency Department and triage nurse.

## 2016-10-05 NOTE — Progress Notes (Signed)
Ok to treat today with Creatinine 1.8 per MD Irene Limbo

## 2016-10-05 NOTE — Progress Notes (Signed)
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 male who has been referred to [place] by Dr .Donato Heinz, MD and Mauricio Po MD for evaluation and management of newly diagnosed diffuse large B-cell lymphoma of the small bowel.  Patient has a history of multiple medical comorbidities as noted below including coronary artery disease status post PCI and CABG, chronic kidney disease stage III, Barrett's esophagus, hypertension, dyslipidemia, interstitial lung disease, GERD, vasculitis involving the lungs and kidneys on chronic Imuran therapy.  He was having upper abdominal discomfort for a few months with loss of about 15-20 pounds. He had a CT scan of the abdomen done by Dr. Havery Moros on 06/15/2016 which showed a 3.3 x 3.2 x 2.4 cm indeterminate lesion involving the left upper quadrant small bowel loops. No additional small bowel lesions noted. No ascites. No retroperitoneal mesenteric or pelvic lymphadenopathy noted.  Patient was seen by Dr. Serita Grammes and underwent a diagnostic laparoscopy with small bowel resection on 07/12/2016. Pathology showed that the lesion was consistent with diffuse large B-cell lymphoma 2 cm in length and 4 cm in width centrally ulcerated mass involving 100% of the bowel circumference. The tumor was about 1 cm thick invading through the entire wall and into the underlying mesenteric fat. The tumor also abuts and focally involve the serosa. No extramural satellite lymph nodes noted. Did not seem to involve the peri-intestinal lymph nodes. Tumor was noted to have a germinal center phenotype.  Patient is healing well from surgery.  Continues to be on Imuran 100 mg by mouth daily. Has not reported any other peripheral enlarged lymph nodes. He is starting to eat better.   INTERVAL HISTORY  Patient is here for follow-up prior to his 3rd cycle of R-CEOP ctx for DLBCL. He notes no acute new symptoms. No fevers or chills. Energy levels have been stable. Notes that he was started on Lasix 20 mg by mouth daily and that has helped his leg swelling. No abdominal pain. No chest pain or shortness of breath.    MEDICAL HISTORY:  Past Medical History:  Diagnosis Date  . ALLERGIC RHINITIS   . Anemia    hx  . Barrett's esophagus   . BOOP (bronchiolitis obliterans with organizing pneumonia) ([place])    a. s/p R VATS 2008.  [name] CAD (coronary artery disease)    a. 05/2000: NSTEMI/CABG x 6: LIMA->LAD, VG->D1, VG->OM1->2, VG->PDA->RPL;  b. 07/2007 MV: high lat infarct, no ischemia, EF 47%;  c. Cath/PCI: LM nl, LAD20p, 46m, D1 nl, D2 60-70ost, D3 nl, LCX 70ost, 189m, OM1/OM2 min irregs, RCA 30 diff, PDA 99, LIMA->LAD atretic, VG->D1 100, VG->OM1->2 100, VG->PDA->RPL 90p (4.0x23 Vision BMS);  c. 07/2012 Echo: EF 55%, gr1 DD.  [name] Cataract   . CKD (chronic kidney disease), stage III    a. renal bx 2008: GLN with vasculitis  . Diffuse large B cell lymphoma ([place]) 08/07/2016  . GERD (gastroesophageal reflux disease)   . Glaucoma    a. Cannot see out of L eye.  . Hematuria    Microscopic  . Hyperglycemia    Patient reported while on prednisone, had to take insulin  . Hyperlipemia   . Hypertension   .  Hypothyroidism   . ILD (interstitial lung disease) ([place])   . Iritis   . Local infection of skin and subcutaneous tissue   . Membranoproliferative nephritis   . Myocardial infarction ([place]) 2002  . Neutropenia, drug-induced ([place])   . Recurrent boils   . Residual foreign body in soft tissue   . Vasculitis ([place])    a.  pauciimmune vasculitis with renal involvement and hx of transient hemoptysis in the past with associated BOOP, 2008 (renal bx  2008: GLN with vasculitis). b. History of treatment with 2 cycles of Cytoxan and pheresis. H/o hemoptysis and pulm hemorrhage with 2nd cycle of cytoxan.    SURGICAL HISTORY: Past Surgical History:  Procedure Laterality Date  . BOWEL RESECTION N/A 07/12/2016   Procedure: SMALL BOWEL RESECTION;  Surgeon: Rolm Bookbinder, MD;  Location: [facility];  Service: General;  Laterality: N/A;  . CATARACT EXTRACTION Bilateral   . CORONARY ARTERY BYPASS GRAFT  2002  . IR FLUORO GUIDE PORT INSERTION RIGHT  08/20/2016  . IR US GUIDE VASC ACCESS RIGHT  08/20/2016  . LAPAROSCOPY N/A 07/12/2016   Procedure: LAPAROSCOPY DIAGNOSTIC;  Surgeon: Rolm Bookbinder, MD;  Location: [facility];  Service: General;  Laterality: N/A;  . LEFT HEART CATHETERIZATION WITH CORONARY/GRAFT ANGIOGRAM N/A 07/26/2012   Procedure: LEFT HEART CATHETERIZATION WITH Beatrix Fetters;  Surgeon: Wellington Hampshire, MD;  Location: [facility] CATH LAB;  Service: Cardiovascular;  Laterality: N/A;  . LUNG BIOPSY    . PERCUTANEOUS CORONARY STENT INTERVENTION (PCI-S)  07/26/2012   Procedure: PERCUTANEOUS CORONARY STENT INTERVENTION (PCI-S);  Surgeon: Wellington Hampshire, MD;  Location: [facility] CATH LAB;  Service: Cardiovascular;;  . RENAL BIOPSY    . TOE AMPUTATION  2009   hammer toe    SOCIAL HISTORY: Social History   Social History  . Marital status: Married    Spouse name: N/A  . Number of children: N/A  . Years of education: N/A   Occupational History  . [place] History Main Topics  . Smoking status: Never Smoker  . Smokeless tobacco: Never Used  . Alcohol use No  . Drug use: No  . Sexual activity: Not on file   Other Topics Concern  . Not on file   Social History Narrative   Regular exercise - yes      [facility] (07/12/16):   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  no pets currently. No bird or mold exposure.    FAMILY HISTORY: Family History  Problem Relation Age of Onset  . Heart disease  Mother   . Diabetes Mother   . Prostate cancer Father   . Depression Other   . Diabetes Other   . Prostate cancer Other   . Colon polyps Neg Hx   . Colon cancer Neg Hx   . Rectal cancer Neg Hx   . Stomach cancer Neg Hx   . Esophageal cancer Neg Hx     ALLERGIES:  is allergic to no known allergies.  MEDICATIONS:  Current Outpatient Prescriptions  Medication Sig Dispense Refill  . acetaminophen (TYLENOL) 500 MG tablet Take 500-1,000 mg by mouth daily as needed for moderate pain or headache.    [name] aspirin EC 81 MG EC tablet Take 1 tablet (81 mg total) by mouth daily. 30 tablet   . ciprofloxacin (CIPRO) 500 MG tablet Take 1 tablet (500 mg total) by mouth 2 (two) times daily. 14 tablet 0  . clopidogrel (PLAVIX) 75 MG tablet Take  1 tablet (75 mg total) by mouth daily. 90 tablet 2  . dexamethasone (DECADRON) 4 MG tablet Take 1 tablet (4 mg total) by mouth 2 (two) times daily with a meal. 20 tablet 0  . finasteride (PROSCAR) 5 MG tablet Take 5 mg by mouth at bedtime.     . furosemide (LASIX) 20 MG tablet Take 20 mg by mouth daily.    [name] levothyroxine (SYNTHROID, LEVOTHROID) 100 MCG tablet Take 100 mcg by mouth daily.      [name] lidocaine-prilocaine (EMLA) cream Apply to affected area once 30 g 3  . LORazepam (ATIVAN) 0.5 MG tablet Take 1 tablet (0.5 mg total) by mouth every 8 (eight) hours as needed for anxiety or sleep. 30 tablet 0  . metoprolol tartrate (LOPRESSOR) 25 MG tablet Take 0.5 tablets (12.5 mg total) by mouth 2 (two) times daily. 90 tablet 2  . Multiple Vitamin (MULTIVITAMIN) tablet Take 1 tablet by mouth daily.      [name] NITROSTAT 0.4 MG SL tablet PLACE 1 TABLET (0.4 MG TOTAL) UNDER THE TONGUE EVERY 5 (FIVE) MINUTES AS NEEDED. MAY REPEAT X3 (Patient taking differently: Place 0.4 mg under the tongue every 5 (five) minutes as needed for chest pain. May repeat x3) 25 tablet 2  . ondansetron (ZOFRAN) 8 MG tablet Take 1 tablet (8 mg total) by mouth 2 (two) times daily as needed for refractory  nausea / vomiting. Start on day 3 after cyclophosphamide. 30 tablet 1  . pantoprazole (PROTONIX) 40 MG tablet TAKE 1 TABLET BY MOUTH  DAILY 90 tablet 1  . predniSONE (DELTASONE) 20 MG tablet Take 3 tablets (60 mg total) by mouth daily. Take on days 1-5 of chemotherapy. 15 tablet 6  . prochlorperazine (COMPAZINE) 10 MG tablet Take 1 tablet (10 mg total) by mouth every 6 (six) hours as needed (Nausea or vomiting). 30 tablet 6  . rosuvastatin (CRESTOR) 10 MG tablet Take 10 mg by mouth at bedtime.     . Tamsulosin HCl (FLOMAX) 0.4 MG CAPS Take 0.4 mg by mouth at bedtime.     [name] telmisartan (MICARDIS) 40 MG tablet Take 20 mg by mouth 2 (two) times daily.     [name] triamcinolone (KENALOG) 0.025 % cream Apply topically 2 (two) times daily. 30 g 0   No current facility-administered medications for this visit.    Facility-Administered Medications Ordered in Other Visits  Medication Dose Route Frequency Provider Last Rate Last Dose  . sodium chloride flush (NS) 0.9 % injection 10 mL  10 mL Intracatheter PRN Brunetta Genera, MD   10 mL at 08/24/16 1653    REVIEW OF SYSTEMS:    10 Point review of Systems was done is negative except as noted above.  PHYSICAL EXAMINATION: ECOG PERFORMANCE STATUS: 2 - Symptomatic, <50% confined to bed  . Vitals:   10/05/16 0845  BP: 126/73  Pulse: 99  Resp: 19  Temp: 98.1 F (36.7 C)   Filed Weights   10/05/16 0845  Weight: 223 lb 1.6 oz (101.2 kg)   .Body mass index is 30.26 kg/m.  GENERAL:alert, in no acute distress and comfortable SKIN: no acute rashes, no significant lesions EYES: conjunctiva are pink and non-injected, sclera anicteric OROPHARYNX: MMM, no exudates, no oropharyngeal erythema or ulceration NECK: supple, no JVD LYMPH:  no palpable lymphadenopathy in the cervical, axillary or inguinal regions LUNGS: clear to auscultation b/l with normal respiratory effort HEART: regular rate & rhythm ABDOMEN:  normoactive bowel sounds , non tender,  not distended. Healed  laparoscopic surgical scars Extremity: 1+ pitting pedal edema improved from last visit PSYCH: alert & oriented x 3 with fluent speech NEURO: no focal motor/sensory deficits  LABORATORY DATA:  I have reviewed the data as listed  . CBC Latest Ref Rng & Units 10/05/2016 09/24/2016  WBC 4.0 - 10.3 10e3/uL 9.3 8.0  Hemoglobin 13.0 - 17.1 g/dL 10.1(L) 11.1(L)  Hematocrit 38.4 - 49.9 % 32.3(L) 34.7(L)  Platelets 140 - 400 10e3/uL 170 142  Abdomen ANC 300  . CMP Latest Ref Rng & Units 10/05/2016 09/24/2016 09/17/2016  Glucose 70 - 140 mg/dl 194(H) 127 121  BUN 7.0 - 26.0 mg/dL 26.3(H) 39.1(H) 46.1(H)  Creatinine 0.7 - 1.3 mg/dL 1.8(H) 1.8(H) 1.8(H)  Sodium 136 - 145 mEq/L 139 139 140  Potassium 3.5 - 5.1 mEq/L 4.3 4.1 5.0  Chloride 101 - 111 mmol/L - - -  CO2 22 - 29 mEq/L 23 25 22   Calcium 8.4 - 10.4 mg/dL 8.9 8.8 8.9  Total Protein 6.4 - 8.3 g/dL 6.1(L) 6.5 6.3(L)  Total Bilirubin 0.20 - 1.20 mg/dL 0.44 0.50 0.38  Alkaline Phos 40 - 150 U/L 82 94 75  AST 5 - 34 U/L 16 16 40(H)  ALT 0 - 55 U/L 22 34 65(H)   . Lab Results  Component Value Date   LDH 232 10/05/2016   Component     Latest Ref Rng & Units 08/11/2016  Uric Acid, Serum     2.6 - 7.4 mg/dl 8.4 (H)  Hepatitis B Surface Ag     Negative Negative  Hep B Core Ab, Tot     Negative Negative  Hep C Virus Ab     0.0 - 0.9 s/co ratio <0.1    Myocardial perfusion scan 06/30/2016 Study Highlights     The left ventricular ejection fraction is moderately decreased (30-44%).  Nuclear stress EF: 40%.  There was no ST segment deviation noted during stress.  No T wave inversion was noted during stress.  Defect 1: There is a large defect of severe severity present in the basal inferolateral, basal anterolateral, mid inferolateral and mid anterolateral location.  Findings consistent with prior myocardial infarction.  This is an intermediate risk study.   Intermediate risk stress nuclear study with a  large scar in the left circumflex coronary artery distribution moderate reduction in left ventricular systolic function.   RADIOGRAPHIC STUDIES: I have personally reviewed the radiological images as listed and agreed with the findings in the report. Dg Chest 2 View  Result Date: 09/07/2016 CLINICAL DATA:  Abnormal chest x-ray previously. Patient presents the ER for low hemoglobin. EXAM: CHEST  2 VIEW COMPARISON:  09/01/2016 FINDINGS: Normal heart size and stable mediastinal contours. Status post CABG. Porta catheter on the right with tip at the SVC level. Postoperative changes at the right lung base. There is no edema, consolidation, effusion, or pneumothorax. No acute osseous finding. IMPRESSION: No evidence of active disease. Electronically Signed   By: Monte Fantasia M.D.   On: 09/07/2016 13:52   PET/CT 08/06/2016: IMPRESSION: 1. In addition to hypermetabolic nodal involvement of the right axilla, pericardial space, porta hepatis/mesenteric root, and retroperitoneum, there is extensive involvement of the liver as well as peritoneal involvement along the liver surface, splenic surface, and upper omentum. 2. Hypermetabolic bony lesions in the left pelvis compatible with malignant involvement. 3. There is a focus of hypermetabolic activity along the anterolateral wall of the left ventricle. Normally such findings turn out to simply be hypermetabolic myocardium. Strictly speaking given  how focal this appears I cannot completely exclude the possibility of tumor involvement within or along the myocardium.   Electronically Signed   By: Van Clines M.D.   On: 08/06/2016 12:48      ASSESSMENT & PLAN:   81 yo male with multiple medical co-morbidities including cardiac co-morbidities, IPF, vascular on chronic immunosupression with   1)Stage IV Diffuse large B-cell lymphoma involving the small intestine (germinal cell phenotype) with complete thickness involvement of bowel and  abutting serosa. Patient on labs today has elevated LDH of 305 and hypercalcemia concerning for possible presence of more extensive DLBCL  PET/CT scan shows extensive involvement with DLBCL, hypermetabolic nodal involvement of the right axilla, pericardial space, porta hepatis/mesenteric root, and retroperitoneum, there is extensive involvement of the liver as well as peritoneal involvement along the liver surface, splenic surface, and upper omentum. 2. Hypermetabolic bony lesions in the left pelvis compatible with malignant involvement. 3. There is a focus of hypermetabolic activity along the anterolateral wall of the left ventricle.   s/p 2 cycles of R-CEOP . Tolerated second cycle of treatment better with dose reductions .  - Status post neutropenic fever and E.coli/pseudomonal bacteremia/HCAP after the first cycle of treatment. Result Significant chemotherapy related thrombocytopenia - now resolved.  2) chronic kidney disease stage III baseline creatinine about 2 3) coronary disease status post PCI and CABG - had a myocardial perfusion imaging scan on 06/30/2016 that showed a moderately decreased ejection fraction of 30-44%- this precludes anthracycline use currently. 4) history of pulmonary and renal vasculitis on chronic Imuran. Now off Imuran considering chemotherapy and Rituxan use.  5) Neutropenia -resolved 6) thrombocytopenia due to chemotherapy -resolved  Plan -Patient is here for his 3rd cycle of R-CEOP. Labs are stable and patient had no prohibitive toxicities from his previous cycle. -continue with dose reduced R-CEOP (Etoposide to 25mg /m2 and Cyclophosphamide to 500mg /m2) -G-CSF support with neulasta -discontinue Allopurinol -hold statins -he will need close f/u with Dr Meredeth Ide for mx of diuretics currently on lasix 20mg  with improved pedal edema  Continue treatment as per schedule R-CEOP q3 weeks PET/CT in 2 weeks RTC in 3 weeks with Dr Irene Limbo with labs C4D1  All of the  patients questions were answered with apparent satisfaction. The patient knows to call the clinic with any problems, questions or concerns.  I spent 25 minutes counseling the patient face to face. The total time spent in the appointment was 40 minutes and more than 50% was on counseling and direct patient cares.    Sullivan Lone MD [place] AAHIVMS [facility] Hematology/Oncology Physician [facility]  (Office):       [phone] (Work cell):  [phone] (Fax):           [phone]

## 2016-10-05 NOTE — Patient Instructions (Signed)
Thank you for choosing [facility] to provide your oncology and hematology care.  To afford each patient quality time with our providers, please arrive 30 minutes before your scheduled appointment time.  If you arrive late for your appointment, you may be asked to reschedule.  We strive to give you quality time with our providers, and arriving late affects you and other patients whose appointments are after yours.   If you are a no show for multiple scheduled visits, you may be dismissed from the clinic at the providers discretion.    Again, thank you for choosing [facility], our hope is that these requests will decrease the amount of time that you wait before being seen by our physicians.  ______________________________________________________________________  Should you have questions after your visit to the [facility], please contact our office at [phone] between the hours of 8:30 and 4:30 p.m.    Voicemails left after 4:30p.m will not be returned until the following business day.    For prescription refill requests, please have your pharmacy contact us directly.  Please also try to allow 48 hours for prescription requests.    Please contact the scheduling department for questions regarding scheduling.  For scheduling of procedures such as PET scans, CT scans, MRI, Ultrasound, etc please contact central scheduling at [phone].    Resources For Cancer Patients and Caregivers:   Oncolink.org:  A wonderful resource for patients and healthcare providers for information regarding your disease, ways to tract your treatment, what to expect, etc.     American Cancer Society:  800-227-2345  Can help patients locate various types of support and financial assistance  Cancer Care: 1-800-813-HOPE (4673) Provides financial assistance, online support groups, medication/co-pay assistance.    [place] DSS:  336-641-3447 Where to apply for food  stamps, Medicaid, and utility assistance  Medicare Rights Center: 800-333-4114 Helps people with Medicare understand their rights and benefits, navigate the Medicare system, and secure the quality healthcare they deserve  SCAT: 336-333-6589 [place] [employer]'s shared-ride transportation service for eligible riders who have a disability that prevents them from riding the fixed route bus.    For additional information on assistance programs please contact our social worker:   Grier Hock/Abigail Elmore:  [phone]            

## 2016-10-06 ENCOUNTER — Ambulatory Visit (HOSPITAL_BASED_OUTPATIENT_CLINIC_OR_DEPARTMENT_OTHER): Payer: Medicare Other

## 2016-10-06 VITALS — BP 125/67 | HR 74 | Temp 98.2°F | Resp 16

## 2016-10-06 DIAGNOSIS — Z5111 Encounter for antineoplastic chemotherapy: Secondary | ICD-10-CM

## 2016-10-06 DIAGNOSIS — C8338 Diffuse large B-cell lymphoma, lymph nodes of multiple sites: Secondary | ICD-10-CM | POA: Diagnosis not present

## 2016-10-06 DIAGNOSIS — Z7189 Other specified counseling: Secondary | ICD-10-CM

## 2016-10-06 MED ORDER — DEXAMETHASONE SODIUM PHOSPHATE 10 MG/ML IJ SOLN
INTRAMUSCULAR | Status: AC
  Filled 2016-10-06: qty 1

## 2016-10-06 MED ORDER — PROCHLORPERAZINE MALEATE 10 MG PO TABS
10.0000 mg | ORAL_TABLET | Freq: Once | ORAL | Status: AC
  Administered 2016-10-06: 10 mg via ORAL

## 2016-10-06 MED ORDER — SODIUM CHLORIDE 0.9 % IV SOLN
Freq: Once | INTRAVENOUS | Status: AC
  Administered 2016-10-06: 14:00:00 via INTRAVENOUS

## 2016-10-06 MED ORDER — SODIUM CHLORIDE 0.9% FLUSH
10.0000 mL | INTRAVENOUS | Status: DC | PRN
  Administered 2016-10-06: 10 mL
  Filled 2016-10-06: qty 10

## 2016-10-06 MED ORDER — SODIUM CHLORIDE 0.9 % IV SOLN
25.0000 mg/m2 | Freq: Once | INTRAVENOUS | Status: AC
  Administered 2016-10-06: 60 mg via INTRAVENOUS
  Filled 2016-10-06: qty 3

## 2016-10-06 MED ORDER — DEXAMETHASONE SODIUM PHOSPHATE 10 MG/ML IJ SOLN
10.0000 mg | Freq: Once | INTRAMUSCULAR | Status: AC
  Administered 2016-10-06: 10 mg via INTRAVENOUS

## 2016-10-06 MED ORDER — PROCHLORPERAZINE MALEATE 10 MG PO TABS
ORAL_TABLET | ORAL | Status: AC
  Filled 2016-10-06: qty 1

## 2016-10-06 MED ORDER — HEPARIN SOD (PORK) LOCK FLUSH 100 UNIT/ML IV SOLN
500.0000 [IU] | Freq: Once | INTRAVENOUS | Status: AC | PRN
  Administered 2016-10-06: 500 [IU]
  Filled 2016-10-06: qty 5

## 2016-10-06 NOTE — Patient Instructions (Signed)
[facility] Discharge Instructions for Patients Receiving Chemotherapy  Today you received the following chemotherapy agents etoposide. To help prevent nausea and vomiting after your treatment, we encourage you to take your nausea medication as directed. No Zofran for 3 days. Take Compazine instead.    If you develop nausea and vomiting that is not controlled by your nausea medication, call the clinic.   BELOW ARE SYMPTOMS THAT SHOULD BE REPORTED IMMEDIATELY:  *FEVER GREATER THAN 100.5 F  *CHILLS WITH OR WITHOUT FEVER  NAUSEA AND VOMITING THAT IS NOT CONTROLLED WITH YOUR NAUSEA MEDICATION  *UNUSUAL SHORTNESS OF BREATH  *UNUSUAL BRUISING OR BLEEDING  TENDERNESS IN MOUTH AND THROAT WITH OR WITHOUT PRESENCE OF ULCERS  *URINARY PROBLEMS  *BOWEL PROBLEMS  UNUSUAL RASH Items with * indicate a potential emergency and should be followed up as soon as possible.  Feel free to call the clinic you have any questions or concerns. The clinic phone number is [phone].  Please show the [place] at check-in to the Emergency Department and triage nurse.

## 2016-10-07 ENCOUNTER — Ambulatory Visit (HOSPITAL_BASED_OUTPATIENT_CLINIC_OR_DEPARTMENT_OTHER): Payer: Medicare Other

## 2016-10-07 VITALS — BP 156/67 | HR 65 | Temp 98.1°F | Resp 18

## 2016-10-07 DIAGNOSIS — I129 Hypertensive chronic kidney disease with stage 1 through stage 4 chronic kidney disease, or unspecified chronic kidney disease: Secondary | ICD-10-CM | POA: Diagnosis not present

## 2016-10-07 DIAGNOSIS — I2510 Atherosclerotic heart disease of native coronary artery without angina pectoris: Secondary | ICD-10-CM | POA: Diagnosis not present

## 2016-10-07 DIAGNOSIS — Z5189 Encounter for other specified aftercare: Secondary | ICD-10-CM | POA: Diagnosis not present

## 2016-10-07 DIAGNOSIS — E669 Obesity, unspecified: Secondary | ICD-10-CM | POA: Diagnosis not present

## 2016-10-07 DIAGNOSIS — C8380 Other non-follicular lymphoma, unspecified site: Secondary | ICD-10-CM | POA: Diagnosis not present

## 2016-10-07 DIAGNOSIS — N013 Rapidly progressive nephritic syndrome with diffuse mesangial proliferative glomerulonephritis: Secondary | ICD-10-CM | POA: Diagnosis not present

## 2016-10-07 DIAGNOSIS — C8338 Diffuse large B-cell lymphoma, lymph nodes of multiple sites: Secondary | ICD-10-CM | POA: Diagnosis not present

## 2016-10-07 DIAGNOSIS — Z5111 Encounter for antineoplastic chemotherapy: Secondary | ICD-10-CM | POA: Diagnosis not present

## 2016-10-07 DIAGNOSIS — E1129 Type 2 diabetes mellitus with other diabetic kidney complication: Secondary | ICD-10-CM | POA: Diagnosis not present

## 2016-10-07 DIAGNOSIS — J42 Unspecified chronic bronchitis: Secondary | ICD-10-CM | POA: Diagnosis not present

## 2016-10-07 DIAGNOSIS — Z7189 Other specified counseling: Secondary | ICD-10-CM

## 2016-10-07 DIAGNOSIS — N400 Enlarged prostate without lower urinary tract symptoms: Secondary | ICD-10-CM | POA: Diagnosis not present

## 2016-10-07 DIAGNOSIS — R109 Unspecified abdominal pain: Secondary | ICD-10-CM | POA: Diagnosis not present

## 2016-10-07 DIAGNOSIS — E7800 Pure hypercholesterolemia, unspecified: Secondary | ICD-10-CM | POA: Diagnosis not present

## 2016-10-07 DIAGNOSIS — N183 Chronic kidney disease, stage 3 (moderate): Secondary | ICD-10-CM | POA: Diagnosis not present

## 2016-10-07 MED ORDER — SODIUM CHLORIDE 0.9% FLUSH
10.0000 mL | INTRAVENOUS | Status: AC | PRN
  Administered 2016-10-07: 10 mL
  Filled 2016-10-07: qty 10

## 2016-10-07 MED ORDER — PROCHLORPERAZINE MALEATE 10 MG PO TABS
10.0000 mg | ORAL_TABLET | Freq: Once | ORAL | Status: AC
  Administered 2016-10-07: 10 mg via ORAL

## 2016-10-07 MED ORDER — DEXAMETHASONE SODIUM PHOSPHATE 10 MG/ML IJ SOLN
INTRAMUSCULAR | Status: AC
  Filled 2016-10-07: qty 1

## 2016-10-07 MED ORDER — SODIUM CHLORIDE 0.9 % IV SOLN
Freq: Once | INTRAVENOUS | Status: AC
  Administered 2016-10-07: 14:00:00 via INTRAVENOUS

## 2016-10-07 MED ORDER — SODIUM CHLORIDE 0.9 % IV SOLN
25.0000 mg/m2 | Freq: Once | INTRAVENOUS | Status: AC
  Administered 2016-10-07: 60 mg via INTRAVENOUS
  Filled 2016-10-07: qty 3

## 2016-10-07 MED ORDER — PEGFILGRASTIM 6 MG/0.6ML ~~LOC~~ PSKT
6.0000 mg | PREFILLED_SYRINGE | Freq: Once | SUBCUTANEOUS | Status: AC
  Administered 2016-10-07: 6 mg via SUBCUTANEOUS
  Filled 2016-10-07: qty 0.6

## 2016-10-07 MED ORDER — DEXAMETHASONE SODIUM PHOSPHATE 10 MG/ML IJ SOLN
10.0000 mg | Freq: Once | INTRAMUSCULAR | Status: AC
  Administered 2016-10-07: 10 mg via INTRAVENOUS

## 2016-10-07 MED ORDER — PROCHLORPERAZINE MALEATE 10 MG PO TABS
ORAL_TABLET | ORAL | Status: AC
  Filled 2016-10-07: qty 1

## 2016-10-07 MED ORDER — HEPARIN SOD (PORK) LOCK FLUSH 100 UNIT/ML IV SOLN
500.0000 [IU] | Freq: Once | INTRAVENOUS | Status: AC | PRN
  Administered 2016-10-07: 500 [IU]
  Filled 2016-10-07: qty 5

## 2016-10-07 NOTE — Patient Instructions (Signed)
[facility] Discharge Instructions for Patients Receiving Chemotherapy  Today you received the following chemotherapy agents etoposide. To help prevent nausea and vomiting after your treatment, we encourage you to take your nausea medication as directed. No Zofran for 3 days. Take Compazine instead.    If you develop nausea and vomiting that is not controlled by your nausea medication, call the clinic.   BELOW ARE SYMPTOMS THAT SHOULD BE REPORTED IMMEDIATELY:  *FEVER GREATER THAN 100.5 F  *CHILLS WITH OR WITHOUT FEVER  NAUSEA AND VOMITING THAT IS NOT CONTROLLED WITH YOUR NAUSEA MEDICATION  *UNUSUAL SHORTNESS OF BREATH  *UNUSUAL BRUISING OR BLEEDING  TENDERNESS IN MOUTH AND THROAT WITH OR WITHOUT PRESENCE OF ULCERS  *URINARY PROBLEMS  *BOWEL PROBLEMS  UNUSUAL RASH Items with * indicate a potential emergency and should be followed up as soon as possible.  Feel free to call the clinic you have any questions or concerns. The clinic phone number is [phone].  Please show the [place] at check-in to the Emergency Department and triage nurse.

## 2016-10-22 ENCOUNTER — Ambulatory Visit (HOSPITAL_COMMUNITY)
Admission: RE | Admit: 2016-10-22 | Discharge: 2016-10-22 | Disposition: A | Discharge status: Home / Self Care | Payer: Medicare Other | Source: Ambulatory Visit | Attending: Hematology | Admitting: Hematology

## 2016-10-22 DIAGNOSIS — C8338 Diffuse large B-cell lymphoma, lymph nodes of multiple sites: Secondary | ICD-10-CM | POA: Diagnosis not present

## 2016-10-22 DIAGNOSIS — C8330 Diffuse large B-cell lymphoma, unspecified site: Secondary | ICD-10-CM | POA: Diagnosis not present

## 2016-10-22 DIAGNOSIS — Z79899 Other long term (current) drug therapy: Secondary | ICD-10-CM | POA: Diagnosis not present

## 2016-10-22 DIAGNOSIS — K439 Ventral hernia without obstruction or gangrene: Secondary | ICD-10-CM | POA: Diagnosis not present

## 2016-10-22 LAB — GLUCOSE, CAPILLARY: GLUCOSE-CAPILLARY: 102 mg/dL — AB (ref 65–99)

## 2016-10-22 MED ORDER — FLUDEOXYGLUCOSE F - 18 (FDG) INJECTION
11.1500 | Freq: Once | INTRAVENOUS | Status: AC | PRN
  Administered 2016-10-22: 11.15 via INTRAVENOUS

## 2016-10-26 ENCOUNTER — Ambulatory Visit (HOSPITAL_BASED_OUTPATIENT_CLINIC_OR_DEPARTMENT_OTHER): Payer: Medicare Other | Admitting: Hematology

## 2016-10-26 ENCOUNTER — Encounter: Payer: Self-pay | Admitting: Hematology

## 2016-10-26 ENCOUNTER — Ambulatory Visit: Payer: Medicare Other

## 2016-10-26 ENCOUNTER — Other Ambulatory Visit (HOSPITAL_BASED_OUTPATIENT_CLINIC_OR_DEPARTMENT_OTHER): Payer: Medicare Other

## 2016-10-26 ENCOUNTER — Ambulatory Visit (HOSPITAL_BASED_OUTPATIENT_CLINIC_OR_DEPARTMENT_OTHER): Payer: Medicare Other

## 2016-10-26 VITALS — BP 148/79 | HR 96 | Temp 98.4°F | Resp 18 | Ht 72.0 in | Wt 227.8 lb

## 2016-10-26 DIAGNOSIS — D649 Anemia, unspecified: Secondary | ICD-10-CM

## 2016-10-26 DIAGNOSIS — Z5111 Encounter for antineoplastic chemotherapy: Secondary | ICD-10-CM | POA: Diagnosis not present

## 2016-10-26 DIAGNOSIS — Z5112 Encounter for antineoplastic immunotherapy: Secondary | ICD-10-CM

## 2016-10-26 DIAGNOSIS — C8338 Diffuse large B-cell lymphoma, lymph nodes of multiple sites: Secondary | ICD-10-CM | POA: Diagnosis not present

## 2016-10-26 DIAGNOSIS — Z95828 Presence of other vascular implants and grafts: Secondary | ICD-10-CM

## 2016-10-26 DIAGNOSIS — Z7189 Other specified counseling: Secondary | ICD-10-CM

## 2016-10-26 LAB — CBC & DIFF AND RETIC
BASO%: 0.2 % (ref 0.0–2.0)
Basophils Absolute: 0 10*3/uL (ref 0.0–0.1)
EOS%: 0.3 % (ref 0.0–7.0)
Eosinophils Absolute: 0 10*3/uL (ref 0.0–0.5)
HCT: 31.7 % — ABNORMAL LOW (ref 38.4–49.9)
HGB: 10.1 g/dL — ABNORMAL LOW (ref 13.0–17.1)
IMMATURE RETIC FRACT: 20.5 % — AB (ref 3.00–10.60)
LYMPH#: 0.5 10*3/uL — AB (ref 0.9–3.3)
LYMPH%: 4.8 % — ABNORMAL LOW (ref 14.0–49.0)
MCH: 32.2 pg (ref 27.2–33.4)
MCHC: 31.9 g/dL — AB (ref 32.0–36.0)
MCV: 101 fL — ABNORMAL HIGH (ref 79.3–98.0)
MONO#: 0.2 10*3/uL (ref 0.1–0.9)
MONO%: 1.7 % (ref 0.0–14.0)
NEUT%: 93 % — AB (ref 39.0–75.0)
NEUTROS ABS: 9.4 10*3/uL — AB (ref 1.5–6.5)
PLATELETS: 189 10*3/uL (ref 140–400)
RBC: 3.14 10*6/uL — AB (ref 4.20–5.82)
RDW: 20 % — AB (ref 11.0–14.6)
RETIC %: 3.52 % — AB (ref 0.80–1.80)
RETIC CT ABS: 110.53 10*3/uL — AB (ref 34.80–93.90)
WBC: 10.1 10*3/uL (ref 4.0–10.3)

## 2016-10-26 LAB — COMPREHENSIVE METABOLIC PANEL
ALT: 18 U/L (ref 0–55)
ANION GAP: 6 meq/L (ref 3–11)
AST: 15 U/L (ref 5–34)
Albumin: 3.2 g/dL — ABNORMAL LOW (ref 3.5–5.0)
Alkaline Phosphatase: 83 U/L (ref 40–150)
BILIRUBIN TOTAL: 0.4 mg/dL (ref 0.20–1.20)
BUN: 32 mg/dL — AB (ref 7.0–26.0)
CHLORIDE: 107 meq/L (ref 98–109)
CO2: 23 meq/L (ref 22–29)
CREATININE: 1.9 mg/dL — AB (ref 0.7–1.3)
Calcium: 9.1 mg/dL (ref 8.4–10.4)
EGFR: 32 mL/min/{1.73_m2} — ABNORMAL LOW (ref >90)
GLUCOSE: 135 mg/dL (ref 70–140)
Potassium: 4.5 mEq/L (ref 3.5–5.1)
SODIUM: 136 meq/L (ref 136–145)
TOTAL PROTEIN: 6.5 g/dL (ref 6.4–8.3)

## 2016-10-26 LAB — LACTATE DEHYDROGENASE: LDH: 196 U/L (ref 125–245)

## 2016-10-26 MED ORDER — DEXAMETHASONE SODIUM PHOSPHATE 10 MG/ML IJ SOLN
10.0000 mg | Freq: Once | INTRAMUSCULAR | Status: AC
  Administered 2016-10-26: 10 mg via INTRAVENOUS

## 2016-10-26 MED ORDER — PALONOSETRON HCL INJECTION 0.25 MG/5ML
INTRAVENOUS | Status: AC
  Filled 2016-10-26: qty 5

## 2016-10-26 MED ORDER — DIPHENHYDRAMINE HCL 25 MG PO CAPS
ORAL_CAPSULE | ORAL | Status: AC
  Filled 2016-10-26: qty 2

## 2016-10-26 MED ORDER — PALONOSETRON HCL INJECTION 0.25 MG/5ML
0.2500 mg | Freq: Once | INTRAVENOUS | Status: AC
  Administered 2016-10-26: 0.25 mg via INTRAVENOUS

## 2016-10-26 MED ORDER — SODIUM CHLORIDE 0.9 % IV SOLN
25.0000 mg/m2 | Freq: Once | INTRAVENOUS | Status: AC
  Administered 2016-10-26: 60 mg via INTRAVENOUS
  Filled 2016-10-26: qty 3

## 2016-10-26 MED ORDER — ACETAMINOPHEN 325 MG PO TABS
650.0000 mg | ORAL_TABLET | Freq: Once | ORAL | Status: AC
  Administered 2016-10-26: 650 mg via ORAL

## 2016-10-26 MED ORDER — SODIUM CHLORIDE 0.9% FLUSH
10.0000 mL | INTRAVENOUS | Status: DC | PRN
  Administered 2016-10-26: 10 mL
  Filled 2016-10-26: qty 10

## 2016-10-26 MED ORDER — HEPARIN SOD (PORK) LOCK FLUSH 100 UNIT/ML IV SOLN
500.0000 [IU] | Freq: Once | INTRAVENOUS | Status: AC | PRN
  Administered 2016-10-26: 500 [IU]
  Filled 2016-10-26: qty 5

## 2016-10-26 MED ORDER — SODIUM CHLORIDE 0.9 % IV SOLN
375.0000 mg/m2 | Freq: Once | INTRAVENOUS | Status: AC
  Administered 2016-10-26: 800 mg via INTRAVENOUS
  Filled 2016-10-26: qty 50

## 2016-10-26 MED ORDER — DIPHENHYDRAMINE HCL 25 MG PO CAPS
50.0000 mg | ORAL_CAPSULE | Freq: Once | ORAL | Status: AC
  Administered 2016-10-26: 50 mg via ORAL

## 2016-10-26 MED ORDER — DEXAMETHASONE SODIUM PHOSPHATE 10 MG/ML IJ SOLN
INTRAMUSCULAR | Status: AC
  Filled 2016-10-26: qty 1

## 2016-10-26 MED ORDER — SODIUM CHLORIDE 0.9 % IV SOLN
Freq: Once | INTRAVENOUS | Status: AC
  Administered 2016-10-26: 14:00:00 via INTRAVENOUS

## 2016-10-26 MED ORDER — ACETAMINOPHEN 325 MG PO TABS
ORAL_TABLET | ORAL | Status: AC
  Filled 2016-10-26: qty 2

## 2016-10-26 MED ORDER — VINCRISTINE SULFATE CHEMO INJECTION 1 MG/ML
2.0000 mg | Freq: Once | INTRAVENOUS | Status: AC
  Administered 2016-10-26: 2 mg via INTRAVENOUS
  Filled 2016-10-26: qty 2

## 2016-10-26 MED ORDER — SODIUM CHLORIDE 0.9 % IV SOLN
500.0000 mg/m2 | Freq: Once | INTRAVENOUS | Status: AC
  Administered 2016-10-26: 1120 mg via INTRAVENOUS
  Filled 2016-10-26: qty 56

## 2016-10-26 MED ORDER — SODIUM CHLORIDE 0.9% FLUSH
10.0000 mL | Freq: Once | INTRAVENOUS | Status: AC
  Administered 2016-10-26: 10 mL
  Filled 2016-10-26: qty 10

## 2016-10-26 NOTE — Progress Notes (Signed)
Dr. Irene Limbo okay to tx today with ctn 1.8.

## 2016-10-26 NOTE — Patient Instructions (Signed)

## 2016-10-26 NOTE — Patient Instructions (Signed)
Thank you for choosing [facility] to provide your oncology and hematology care.  To afford each patient quality time with our providers, please arrive 30 minutes before your scheduled appointment time.  If you arrive late for your appointment, you may be asked to reschedule.  We strive to give you quality time with our providers, and arriving late affects you and other patients whose appointments are after yours.  If you are a no show for multiple scheduled visits, you may be dismissed from the clinic at the providers discretion.   Again, thank you for choosing [facility], our hope is that these requests will decrease the amount of time that you wait before being seen by our physicians.  ______________________________________________________________________ Should you have questions after your visit to the [facility], please contact our office at [phone] between the hours of 8:30 and 4:30 p.m.    Voicemails left after 4:30p.m will not be returned until the following business day.   For prescription refill requests, please have your pharmacy contact us directly.  Please also try to allow 48 hours for prescription requests.   Please contact the scheduling department for questions regarding scheduling.  For scheduling of procedures such as PET scans, CT scans, MRI, Ultrasound, etc please contact central scheduling at [phone].   Resources For Cancer Patients and Caregivers:  American Cancer Society:  800-227-2345  Can help patients locate various types of support and financial assistance Cancer Care: 1-800-813-HOPE (4673) Provides financial assistance, online support groups, medication/co-pay assistance.   [place] DSS:  336-641-3447 Where to apply for food stamps, Medicaid, and utility assistance Medicare Rights Center: 800-333-4114 Helps people with Medicare understand their rights and benefits, navigate the Medicare system, and secure the  quality healthcare they deserve SCAT: 336-333-6589 [place] [employer]'s shared-ride transportation service for eligible riders who have a disability that prevents them from riding the fixed route bus.   For additional information on assistance programs please contact our social worker:   Grier Hock/Abigail Elmore:  [phone] 

## 2016-10-26 NOTE — Patient Instructions (Signed)
[facility] Discharge Instructions for Patients Receiving Chemotherapy  Today you received the following chemotherapy agents Vincristine, Cytoxan, Etoposide and Rituxan   To help prevent nausea and vomiting after your treatment, we encourage you to take your nausea medication as directed. No Zofran for 3 days. Take Compazine instead.    If you develop nausea and vomiting that is not controlled by your nausea medication, call the clinic.   BELOW ARE SYMPTOMS THAT SHOULD BE REPORTED IMMEDIATELY:  *FEVER GREATER THAN 100.5 F  *CHILLS WITH OR WITHOUT FEVER  NAUSEA AND VOMITING THAT IS NOT CONTROLLED WITH YOUR NAUSEA MEDICATION  *UNUSUAL SHORTNESS OF BREATH  *UNUSUAL BRUISING OR BLEEDING  TENDERNESS IN MOUTH AND THROAT WITH OR WITHOUT PRESENCE OF ULCERS  *URINARY PROBLEMS  *BOWEL PROBLEMS  UNUSUAL RASH Items with * indicate a potential emergency and should be followed up as soon as possible.  Feel free to call the clinic you have any questions or concerns. The clinic phone number is [phone].  Please show the [place] at check-in to the Emergency Department and triage nurse.

## 2016-10-26 NOTE — Progress Notes (Signed)
OK to treat with creatinine today per [facility] RN

## 2016-10-27 ENCOUNTER — Ambulatory Visit (HOSPITAL_BASED_OUTPATIENT_CLINIC_OR_DEPARTMENT_OTHER): Payer: Medicare Other

## 2016-10-27 VITALS — BP 137/74 | HR 78 | Temp 98.5°F | Resp 18

## 2016-10-27 DIAGNOSIS — C8338 Diffuse large B-cell lymphoma, lymph nodes of multiple sites: Secondary | ICD-10-CM | POA: Diagnosis not present

## 2016-10-27 DIAGNOSIS — Z7189 Other specified counseling: Secondary | ICD-10-CM

## 2016-10-27 DIAGNOSIS — Z5111 Encounter for antineoplastic chemotherapy: Secondary | ICD-10-CM

## 2016-10-27 MED ORDER — SODIUM CHLORIDE 0.9% FLUSH
10.0000 mL | INTRAVENOUS | Status: DC | PRN
  Administered 2016-10-27: 10 mL
  Filled 2016-10-27: qty 10

## 2016-10-27 MED ORDER — SODIUM CHLORIDE 0.9 % IV SOLN
Freq: Once | INTRAVENOUS | Status: AC
  Administered 2016-10-27: 14:00:00 via INTRAVENOUS

## 2016-10-27 MED ORDER — SODIUM CHLORIDE 0.9 % IV SOLN
25.0000 mg/m2 | Freq: Once | INTRAVENOUS | Status: AC
  Administered 2016-10-27: 60 mg via INTRAVENOUS
  Filled 2016-10-27: qty 3

## 2016-10-27 MED ORDER — PROCHLORPERAZINE MALEATE 10 MG PO TABS
10.0000 mg | ORAL_TABLET | Freq: Once | ORAL | Status: AC
  Administered 2016-10-27: 10 mg via ORAL

## 2016-10-27 MED ORDER — HEPARIN SOD (PORK) LOCK FLUSH 100 UNIT/ML IV SOLN
500.0000 [IU] | Freq: Once | INTRAVENOUS | Status: AC | PRN
Start: 2016-10-27 — End: 2016-10-27
  Administered 2016-10-27: 500 [IU]
  Filled 2016-10-27: qty 5

## 2016-10-27 MED ORDER — PROCHLORPERAZINE MALEATE 10 MG PO TABS
ORAL_TABLET | ORAL | Status: AC
  Filled 2016-10-27: qty 1

## 2016-10-27 MED ORDER — DEXAMETHASONE SODIUM PHOSPHATE 10 MG/ML IJ SOLN
10.0000 mg | Freq: Once | INTRAMUSCULAR | Status: AC
  Administered 2016-10-27: 10 mg via INTRAVENOUS

## 2016-10-27 MED ORDER — DEXAMETHASONE SODIUM PHOSPHATE 10 MG/ML IJ SOLN
INTRAMUSCULAR | Status: AC
  Filled 2016-10-27: qty 1

## 2016-10-27 NOTE — Patient Instructions (Signed)
[facility] Discharge Instructions for Patients Receiving Chemotherapy  Today you received the following chemotherapy agents: Etoposide   To help prevent nausea and vomiting after your treatment, we encourage you to take your nausea medication as directed.    If you develop nausea and vomiting that is not controlled by your nausea medication, call the clinic.   BELOW ARE SYMPTOMS THAT SHOULD BE REPORTED IMMEDIATELY:  *FEVER GREATER THAN 100.5 F  *CHILLS WITH OR WITHOUT FEVER  NAUSEA AND VOMITING THAT IS NOT CONTROLLED WITH YOUR NAUSEA MEDICATION  *UNUSUAL SHORTNESS OF BREATH  *UNUSUAL BRUISING OR BLEEDING  TENDERNESS IN MOUTH AND THROAT WITH OR WITHOUT PRESENCE OF ULCERS  *URINARY PROBLEMS  *BOWEL PROBLEMS  UNUSUAL RASH Items with * indicate a potential emergency and should be followed up as soon as possible.  Feel free to call the clinic you have any questions or concerns. The clinic phone number is [phone].  Please show the CHEMO ALERT CARD at check-in to the Emergency Department and triage nurse.   

## 2016-10-28 ENCOUNTER — Ambulatory Visit (HOSPITAL_BASED_OUTPATIENT_CLINIC_OR_DEPARTMENT_OTHER): Payer: Medicare Other

## 2016-10-28 VITALS — BP 146/74 | HR 78 | Temp 98.2°F | Resp 18

## 2016-10-28 DIAGNOSIS — Z7189 Other specified counseling: Secondary | ICD-10-CM

## 2016-10-28 DIAGNOSIS — Z5189 Encounter for other specified aftercare: Secondary | ICD-10-CM | POA: Diagnosis not present

## 2016-10-28 DIAGNOSIS — C8338 Diffuse large B-cell lymphoma, lymph nodes of multiple sites: Secondary | ICD-10-CM | POA: Diagnosis not present

## 2016-10-28 DIAGNOSIS — Z5111 Encounter for antineoplastic chemotherapy: Secondary | ICD-10-CM | POA: Diagnosis present

## 2016-10-28 MED ORDER — PROCHLORPERAZINE MALEATE 10 MG PO TABS
ORAL_TABLET | ORAL | Status: AC
  Filled 2016-10-28: qty 1

## 2016-10-28 MED ORDER — PROCHLORPERAZINE MALEATE 10 MG PO TABS
10.0000 mg | ORAL_TABLET | Freq: Once | ORAL | Status: AC
  Administered 2016-10-28: 10 mg via ORAL

## 2016-10-28 MED ORDER — PEGFILGRASTIM 6 MG/0.6ML ~~LOC~~ PSKT
6.0000 mg | PREFILLED_SYRINGE | Freq: Once | SUBCUTANEOUS | Status: AC
  Administered 2016-10-28: 6 mg via SUBCUTANEOUS
  Filled 2016-10-28: qty 0.6

## 2016-10-28 MED ORDER — SODIUM CHLORIDE 0.9% FLUSH
10.0000 mL | INTRAVENOUS | Status: DC | PRN
  Administered 2016-10-28: 10 mL
  Filled 2016-10-28: qty 10

## 2016-10-28 MED ORDER — DEXAMETHASONE SODIUM PHOSPHATE 10 MG/ML IJ SOLN
10.0000 mg | Freq: Once | INTRAMUSCULAR | Status: AC
  Administered 2016-10-28: 10 mg via INTRAVENOUS

## 2016-10-28 MED ORDER — DEXAMETHASONE SODIUM PHOSPHATE 10 MG/ML IJ SOLN
INTRAMUSCULAR | Status: AC
  Filled 2016-10-28: qty 1

## 2016-10-28 MED ORDER — SODIUM CHLORIDE 0.9 % IV SOLN
Freq: Once | INTRAVENOUS | Status: AC
  Administered 2016-10-28: 14:00:00 via INTRAVENOUS

## 2016-10-28 MED ORDER — HEPARIN SOD (PORK) LOCK FLUSH 100 UNIT/ML IV SOLN
500.0000 [IU] | Freq: Once | INTRAVENOUS | Status: AC | PRN
  Administered 2016-10-28: 500 [IU]
  Filled 2016-10-28: qty 5

## 2016-10-28 MED ORDER — SODIUM CHLORIDE 0.9 % IV SOLN
25.0000 mg/m2 | Freq: Once | INTRAVENOUS | Status: AC
  Administered 2016-10-28: 60 mg via INTRAVENOUS
  Filled 2016-10-28: qty 3

## 2016-10-28 NOTE — Patient Instructions (Signed)
[facility] Discharge Instructions for Patients Receiving Chemotherapy  Today you received the following chemotherapy agents Etoposide, Neulasta Onpro  To help prevent nausea and vomiting after your treatment, we encourage you to take your nausea medication    If you develop nausea and vomiting that is not controlled by your nausea medication, call the clinic.   BELOW ARE SYMPTOMS THAT SHOULD BE REPORTED IMMEDIATELY:  *FEVER GREATER THAN 100.5 F  *CHILLS WITH OR WITHOUT FEVER  NAUSEA AND VOMITING THAT IS NOT CONTROLLED WITH YOUR NAUSEA MEDICATION  *UNUSUAL SHORTNESS OF BREATH  *UNUSUAL BRUISING OR BLEEDING  TENDERNESS IN MOUTH AND THROAT WITH OR WITHOUT PRESENCE OF ULCERS  *URINARY PROBLEMS  *BOWEL PROBLEMS  UNUSUAL RASH Items with * indicate a potential emergency and should be followed up as soon as possible.  Feel free to call the clinic you have any questions or concerns. The clinic phone number is [phone].  Please show the [place] at check-in to the Emergency Department and triage nurse.

## 2016-10-29 ENCOUNTER — Telehealth: Payer: Self-pay | Admitting: Hematology

## 2016-10-29 NOTE — Telephone Encounter (Signed)
Called to confirm appts - patient already aware - my chart active

## 2016-10-30 ENCOUNTER — Inpatient Hospital Stay (HOSPITAL_COMMUNITY)
Admission: EM | Admit: 2016-10-30 | Discharge: 2016-11-05 | DRG: 392 | Disposition: A | Discharge status: Home / Self Care | Payer: Medicare Other | Attending: Internal Medicine | Admitting: Internal Medicine

## 2016-10-30 ENCOUNTER — Other Ambulatory Visit: Payer: Self-pay

## 2016-10-30 ENCOUNTER — Encounter (HOSPITAL_COMMUNITY): Payer: Self-pay | Admitting: Internal Medicine

## 2016-10-30 ENCOUNTER — Emergency Department (HOSPITAL_COMMUNITY): Payer: Medicare Other

## 2016-10-30 DIAGNOSIS — H409 Unspecified glaucoma: Secondary | ICD-10-CM | POA: Diagnosis present

## 2016-10-30 DIAGNOSIS — D6959 Other secondary thrombocytopenia: Secondary | ICD-10-CM | POA: Diagnosis present

## 2016-10-30 DIAGNOSIS — N183 Chronic kidney disease, stage 3 unspecified: Secondary | ICD-10-CM

## 2016-10-30 DIAGNOSIS — T458X5A Adverse effect of other primarily systemic and hematological agents, initial encounter: Secondary | ICD-10-CM | POA: Diagnosis present

## 2016-10-30 DIAGNOSIS — E039 Hypothyroidism, unspecified: Secondary | ICD-10-CM | POA: Diagnosis present

## 2016-10-30 DIAGNOSIS — Z7969 Long term (current) use of other immunomodulators and immunosuppressants: Secondary | ICD-10-CM

## 2016-10-30 DIAGNOSIS — D649 Anemia, unspecified: Secondary | ICD-10-CM | POA: Diagnosis present

## 2016-10-30 DIAGNOSIS — Z9189 Other specified personal risk factors, not elsewhere classified: Secondary | ICD-10-CM

## 2016-10-30 DIAGNOSIS — Z8249 Family history of ischemic heart disease and other diseases of the circulatory system: Secondary | ICD-10-CM

## 2016-10-30 DIAGNOSIS — K219 Gastro-esophageal reflux disease without esophagitis: Secondary | ICD-10-CM | POA: Diagnosis present

## 2016-10-30 DIAGNOSIS — K5720 Diverticulitis of large intestine with perforation and abscess without bleeding: Principal | ICD-10-CM | POA: Diagnosis present

## 2016-10-30 DIAGNOSIS — Z79899 Other long term (current) drug therapy: Secondary | ICD-10-CM | POA: Diagnosis not present

## 2016-10-30 DIAGNOSIS — C8338 Diffuse large B-cell lymphoma, lymph nodes of multiple sites: Secondary | ICD-10-CM | POA: Diagnosis not present

## 2016-10-30 DIAGNOSIS — R739 Hyperglycemia, unspecified: Secondary | ICD-10-CM | POA: Diagnosis present

## 2016-10-30 DIAGNOSIS — E785 Hyperlipidemia, unspecified: Secondary | ICD-10-CM | POA: Diagnosis present

## 2016-10-30 DIAGNOSIS — I2510 Atherosclerotic heart disease of native coronary artery without angina pectoris: Secondary | ICD-10-CM | POA: Diagnosis present

## 2016-10-30 DIAGNOSIS — D72829 Elevated white blood cell count, unspecified: Secondary | ICD-10-CM | POA: Diagnosis present

## 2016-10-30 DIAGNOSIS — C8339 Diffuse large B-cell lymphoma, extranodal and solid organ sites: Secondary | ICD-10-CM | POA: Diagnosis present

## 2016-10-30 DIAGNOSIS — I129 Hypertensive chronic kidney disease with stage 1 through stage 4 chronic kidney disease, or unspecified chronic kidney disease: Secondary | ICD-10-CM | POA: Diagnosis present

## 2016-10-30 DIAGNOSIS — I110 Hypertensive heart disease with heart failure: Secondary | ICD-10-CM | POA: Diagnosis not present

## 2016-10-30 DIAGNOSIS — N400 Enlarged prostate without lower urinary tract symptoms: Secondary | ICD-10-CM | POA: Diagnosis present

## 2016-10-30 DIAGNOSIS — J8410 Pulmonary fibrosis, unspecified: Secondary | ICD-10-CM | POA: Diagnosis present

## 2016-10-30 DIAGNOSIS — Z833 Family history of diabetes mellitus: Secondary | ICD-10-CM | POA: Diagnosis not present

## 2016-10-30 DIAGNOSIS — C8330 Diffuse large B-cell lymphoma, unspecified site: Secondary | ICD-10-CM | POA: Diagnosis not present

## 2016-10-30 DIAGNOSIS — I776 Arteritis, unspecified: Secondary | ICD-10-CM | POA: Diagnosis present

## 2016-10-30 DIAGNOSIS — Z951 Presence of aortocoronary bypass graft: Secondary | ICD-10-CM | POA: Diagnosis not present

## 2016-10-30 DIAGNOSIS — K5730 Diverticulosis of large intestine without perforation or abscess without bleeding: Secondary | ICD-10-CM | POA: Diagnosis not present

## 2016-10-30 DIAGNOSIS — T451X5A Adverse effect of antineoplastic and immunosuppressive drugs, initial encounter: Secondary | ICD-10-CM | POA: Diagnosis present

## 2016-10-30 DIAGNOSIS — K5792 Diverticulitis of intestine, part unspecified, without perforation or abscess without bleeding: Secondary | ICD-10-CM | POA: Diagnosis not present

## 2016-10-30 DIAGNOSIS — T380X5A Adverse effect of glucocorticoids and synthetic analogues, initial encounter: Secondary | ICD-10-CM | POA: Diagnosis present

## 2016-10-30 DIAGNOSIS — R109 Unspecified abdominal pain: Secondary | ICD-10-CM | POA: Diagnosis not present

## 2016-10-30 DIAGNOSIS — Z9221 Personal history of antineoplastic chemotherapy: Secondary | ICD-10-CM | POA: Diagnosis not present

## 2016-10-30 DIAGNOSIS — C8590 Non-Hodgkin lymphoma, unspecified, unspecified site: Secondary | ICD-10-CM

## 2016-10-30 DIAGNOSIS — I10 Essential (primary) hypertension: Secondary | ICD-10-CM | POA: Diagnosis not present

## 2016-10-30 DIAGNOSIS — Z955 Presence of coronary angioplasty implant and graft: Secondary | ICD-10-CM | POA: Diagnosis not present

## 2016-10-30 DIAGNOSIS — I252 Old myocardial infarction: Secondary | ICD-10-CM

## 2016-10-30 DIAGNOSIS — D849 Immunodeficiency, unspecified: Secondary | ICD-10-CM | POA: Diagnosis not present

## 2016-10-30 DIAGNOSIS — Z7902 Long term (current) use of antithrombotics/antiplatelets: Secondary | ICD-10-CM

## 2016-10-30 DIAGNOSIS — Z8042 Family history of malignant neoplasm of prostate: Secondary | ICD-10-CM | POA: Diagnosis not present

## 2016-10-30 DIAGNOSIS — Z6830 Body mass index (BMI) 30.0-30.9, adult: Secondary | ICD-10-CM

## 2016-10-30 DIAGNOSIS — N184 Chronic kidney disease, stage 4 (severe): Secondary | ICD-10-CM | POA: Diagnosis present

## 2016-10-30 DIAGNOSIS — D899 Disorder involving the immune mechanism, unspecified: Secondary | ICD-10-CM | POA: Diagnosis not present

## 2016-10-30 DIAGNOSIS — E669 Obesity, unspecified: Secondary | ICD-10-CM | POA: Diagnosis present

## 2016-10-30 DIAGNOSIS — E038 Other specified hypothyroidism: Secondary | ICD-10-CM | POA: Diagnosis not present

## 2016-10-30 DIAGNOSIS — Z7982 Long term (current) use of aspirin: Secondary | ICD-10-CM

## 2016-10-30 DIAGNOSIS — D72828 Other elevated white blood cell count: Secondary | ICD-10-CM | POA: Diagnosis present

## 2016-10-30 LAB — COMPREHENSIVE METABOLIC PANEL
ALBUMIN: 3.1 g/dL — AB (ref 3.5–5.0)
ALK PHOS: 59 U/L (ref 38–126)
ALT: 32 U/L (ref 17–63)
AST: 27 U/L (ref 15–41)
Anion gap: 7 (ref 5–15)
BILIRUBIN TOTAL: 0.6 mg/dL (ref 0.3–1.2)
BUN: 54 mg/dL — AB (ref 6–20)
CALCIUM: 8.6 mg/dL — AB (ref 8.9–10.3)
CO2: 22 mmol/L (ref 22–32)
Chloride: 108 mmol/L (ref 101–111)
Creatinine, Ser: 2 mg/dL — ABNORMAL HIGH (ref 0.61–1.24)
GFR calc Af Amer: 35 mL/min — ABNORMAL LOW (ref >60)
GFR calc non Af Amer: 30 mL/min — ABNORMAL LOW (ref >60)
GLUCOSE: 176 mg/dL — AB (ref 65–99)
Potassium: 4.1 mmol/L (ref 3.5–5.1)
SODIUM: 137 mmol/L (ref 135–145)
TOTAL PROTEIN: 5.4 g/dL — AB (ref 6.5–8.1)

## 2016-10-30 LAB — URINALYSIS, ROUTINE W REFLEX MICROSCOPIC
BILIRUBIN URINE: NEGATIVE
Bilirubin Urine: NEGATIVE
GLUCOSE, UA: NEGATIVE mg/dL
Glucose, UA: NEGATIVE mg/dL
HGB URINE DIPSTICK: NEGATIVE
Hgb urine dipstick: NEGATIVE
KETONES UR: NEGATIVE mg/dL
Ketones, ur: NEGATIVE mg/dL
LEUKOCYTES UA: NEGATIVE
Leukocytes, UA: NEGATIVE
Nitrite: NEGATIVE
Nitrite: NEGATIVE
PH: 6 (ref 5.0–8.0)
PH: 5.5 (ref 5.0–8.0)
Protein, ur: NEGATIVE mg/dL
Protein, ur: NEGATIVE mg/dL
SPECIFIC GRAVITY, URINE: 1.01 (ref 1.005–1.030)
Specific Gravity, Urine: 1.015 (ref 1.005–1.030)

## 2016-10-30 LAB — CBC
HEMATOCRIT: 27.1 % — AB (ref 39.0–52.0)
Hemoglobin: 8.9 g/dL — ABNORMAL LOW (ref 13.0–17.0)
MCH: 32.7 pg (ref 26.0–34.0)
MCHC: 32.8 g/dL (ref 30.0–36.0)
MCV: 99.6 fL (ref 78.0–100.0)
Platelets: 168 10*3/uL (ref 150–400)
RBC: 2.72 MIL/uL — ABNORMAL LOW (ref 4.22–5.81)
RDW: 19.9 % — AB (ref 11.5–15.5)
WBC: 35.7 10*3/uL — ABNORMAL HIGH (ref 4.0–10.5)

## 2016-10-30 LAB — LIPASE, BLOOD: Lipase: 37 U/L (ref 11–51)

## 2016-10-30 MED ORDER — MORPHINE SULFATE (PF) 2 MG/ML IV SOLN: 4.0000 mg | Freq: Once | INTRAVENOUS | Status: DC

## 2016-10-30 MED ORDER — ONDANSETRON HCL 4 MG/2ML IJ SOLN
4.0000 mg | Freq: Once | INTRAMUSCULAR | Status: AC
  Administered 2016-10-30: 4 mg via INTRAVENOUS
  Filled 2016-10-30: qty 2

## 2016-10-30 MED ORDER — FENTANYL CITRATE (PF) 100 MCG/2ML IJ SOLN
50.0000 ug | Freq: Once | INTRAMUSCULAR | Status: AC
  Administered 2016-10-30: 50 ug via INTRAVENOUS
  Filled 2016-10-30: qty 2

## 2016-10-30 MED ORDER — PIPERACILLIN-TAZOBACTAM 3.375 G IVPB 30 MIN
3.3750 g | Freq: Once | INTRAVENOUS | Status: AC
  Administered 2016-10-30: 3.375 g via INTRAVENOUS
  Filled 2016-10-30: qty 50

## 2016-10-30 MED ORDER — SODIUM CHLORIDE 0.9 % IV BOLUS (SEPSIS)
1000.0000 mL | Freq: Once | INTRAVENOUS | Status: AC
  Administered 2016-10-30: 1000 mL via INTRAVENOUS

## 2016-10-30 MED ORDER — ACETAMINOPHEN 325 MG PO TABS
650.0000 mg | ORAL_TABLET | Freq: Four times a day (QID) | ORAL | Status: DC | PRN
  Administered 2016-11-02: 650 mg via ORAL
  Filled 2016-10-30: qty 2

## 2016-10-30 MED ORDER — METOPROLOL TARTRATE 5 MG/5ML IV SOLN
5.0000 mg | Freq: Three times a day (TID) | INTRAVENOUS | Status: DC
  Administered 2016-10-30 – 2016-11-03 (×12): 5 mg via INTRAVENOUS
  Filled 2016-10-30 (×13): qty 5

## 2016-10-30 MED ORDER — PANTOPRAZOLE SODIUM 40 MG IV SOLR
40.0000 mg | INTRAVENOUS | Status: DC
  Administered 2016-10-30 – 2016-11-02 (×4): 40 mg via INTRAVENOUS
  Filled 2016-10-30 (×4): qty 40

## 2016-10-30 MED ORDER — ACETAMINOPHEN 650 MG RE SUPP: 650.0000 mg | Freq: Four times a day (QID) | RECTAL | Status: DC | PRN

## 2016-10-30 MED ORDER — ONDANSETRON HCL 4 MG PO TABS: 4.0000 mg | ORAL_TABLET | Freq: Four times a day (QID) | ORAL | Status: DC | PRN

## 2016-10-30 MED ORDER — PIPERACILLIN-TAZOBACTAM 3.375 G IVPB
3.3750 g | Freq: Three times a day (TID) | INTRAVENOUS | Status: DC
  Administered 2016-10-30 – 2016-11-05 (×17): 3.375 g via INTRAVENOUS
  Filled 2016-10-30 (×17): qty 50

## 2016-10-30 MED ORDER — DEXAMETHASONE SODIUM PHOSPHATE 4 MG/ML IJ SOLN
4.0000 mg | Freq: Two times a day (BID) | INTRAMUSCULAR | Status: DC
  Administered 2016-10-30 – 2016-10-31 (×2): 4 mg via INTRAVENOUS
  Filled 2016-10-30 (×2): qty 1

## 2016-10-30 MED ORDER — ONDANSETRON HCL 4 MG/2ML IJ SOLN: 4.0000 mg | Freq: Four times a day (QID) | INTRAMUSCULAR | Status: DC | PRN

## 2016-10-30 MED ORDER — SODIUM CHLORIDE 0.9 % IV SOLN
INTRAVENOUS | Status: DC
  Administered 2016-10-30 (×2): via INTRAVENOUS

## 2016-10-30 MED ORDER — MORPHINE SULFATE (PF) 2 MG/ML IV SOLN
1.0000 mg | INTRAVENOUS | Status: DC | PRN
  Administered 2016-10-30: 2 mg via INTRAVENOUS
  Filled 2016-10-30: qty 1

## 2016-10-30 NOTE — ED Triage Notes (Signed)
Pt complaint of LLQ pain onset 1800-1900 last night; denies n/v/d or GU symptoms; verbalizes pain steady and intermittent sharp pains. Last chemo on Thursday.

## 2016-10-30 NOTE — ED Notes (Signed)
Pt has urinal and aware of need for urine specimen. Pt also states would like blood drawn from port.

## 2016-10-30 NOTE — Consult Note (Addendum)
Re:   Luke Mcbride DOB:   05-05-1936 MRN:   [id number]   [facility]  Chief Complaint Abdominal pain  ASSESEMENT AND PLAN: 1.  Perforated diverticulitis  WBC - 35,700 - 10/30/2016  He looks better than expected.  Elevated WBC is in part due to Neulasta and high dose steroids. Plan: Antibiotics, NPO, IVF (I increased the IV fluids a little because I think that he will need the volume), and repeat labs and PE. Hold Plavix for now. Hopefully this perforation will seal and he will not need surgery.  He has a lot of medical problems.  The steroids and chronic medical illness probably set him up for a higher risk of failure.  If he needs surgery, will need colostomy.  2.  Large B Cell lymphoma  Seen by Dr. Carolyne Fiscal  Last chemotx - Thursday - 10/28/2016.  This was cycle 4 of his chemotx.  He received Neulasta  Original small bowel resection by Dr. Donne Hazel - 08/20/2016  PET scan on 10/22/2016 shows good response to chemotx.  Because of his active treatment, I spoke to Dr. Alen Blew, who is on call for oncology, to see the patient.  3. Anticoagulated on Plavix  Last dose AM of 6/16.  Hold for possible surgery. 4.  On large dose steroids - Prednisone 60 mg per day  But this is only given during his chemotx course. 5.  CAD  CABG x 6 - 05/2000, stent placed about 2015  Followed by Dr. [name] 6.  CKI - vasculitis (He was chronically on Imuran until the lymphoma diagnosis)  Creatinine - 2.0 - 10/30/2016 (has been running around 1.8)  Seen by Dr. Marval Regal 7.  Lung disease  Seen by Dr. Lake Bells - 07/01/2016 for preop evaluation  Chief Complaint  Patient presents with  . Abdominal Pain  . Cancer   PHYSICIAN REQUESTING CONSULTATION:  Dr. Suzzanne Cloud, Karenann Cai  HISTORY OF PRESENT ILLNESS: Luke Mcbride is a 81 y.o. (DOB: 01/31/1936)  white male whose primary care physician is Donato Heinz, MD.  He came to the [facility] ER because of abdominal pain. He is in the room by himself.   His wife just left the hospital.  He underwent a SB resection on 07/12/2016 by Dr. Jeanmarie Hubert and was found to have a diffuse large B cell lymphoma involving the small bowel.  He has been treated for the lymphoma by Dr. Carolyne Fiscal. He has received 4 cycles of treatment and his last treatment was 10/28/2016. He was hospitalized from 09/01/2016 to 09/05/2016 for febrile neutropenia.  The abdominal pain began last PM. It is localized to his left lateral abdomen. He's had no nausea or vomiting or diarrhea. He's had no prior history of diverticular disease. He had a colonoscopy on 04/23/2016 by Dr. Havery Moros, in which he was found to have sigmoid and left colon diverticula. He has no history of stomach, liver, or pancreas disease. His only abdominal surgery was Dr. Cristal Generous small bowel resection in February 2018.  CT scan - 10/30/2016 - 1. Acute perforated diverticulitis of the descending colon.  Scattered free air throughout the peritoneal cavity. No focal drainable abscess .  2. Aortic atherosclerosis. Stable 3.3 cm infrarenal abdominal aortic aneurysm. Recommend followup by ultrasound in 3 years.  3. Additional chronic findings as above. WBC - 35,700 - 10/30/2016   Past Medical History:  Diagnosis Date  . ALLERGIC RHINITIS   . Anemia    hx  . Barrett's esophagus   .  BOOP (bronchiolitis obliterans with organizing pneumonia) ([place])    a. s/p R VATS 2008.  [name] CAD (coronary artery disease)    a. 05/2000: NSTEMI/CABG x 6: LIMA->LAD, VG->D1, VG->OM1->2, VG->PDA->RPL;  b. 07/2007 MV: high lat infarct, no ischemia, EF 47%;  c. Cath/PCI: LM nl, LAD20p, 43m, D1 nl, D2 60-70ost, D3 nl, LCX 70ost, 17m, OM1/OM2 min irregs, RCA 30 diff, PDA 99, LIMA->LAD atretic, VG->D1 100, VG->OM1->2 100, VG->PDA->RPL 90p (4.0x23 Vision BMS);  c. 07/2012 Echo: EF 55%, gr1 DD.  [name] Cataract   . CKD (chronic kidney disease), stage III    a. renal bx 2008: GLN with vasculitis  . Diffuse large B cell lymphoma ([place]) 08/07/2016  . GERD  (gastroesophageal reflux disease)   . Glaucoma    a. Cannot see out of L eye.  . Hematuria    Microscopic  . Hyperglycemia    Patient reported while on prednisone, had to take insulin  . Hyperlipemia   . Hypertension   . Hypothyroidism   . ILD (interstitial lung disease) ([place])   . Iritis   . Local infection of skin and subcutaneous tissue   . Membranoproliferative nephritis   . Myocardial infarction ([place]) 2002  . Neutropenia, drug-induced ([place])   . Recurrent boils   . Residual foreign body in soft tissue   . Vasculitis ([place])    a.  pauciimmune vasculitis with renal involvement and hx of transient hemoptysis in the past with associated BOOP, 2008 (renal bx 2008: GLN with vasculitis). b. History of treatment with 2 cycles of Cytoxan and pheresis. H/o hemoptysis and pulm hemorrhage with 2nd cycle of cytoxan.      Past Surgical History:  Procedure Laterality Date  . BOWEL RESECTION N/A 07/12/2016   Procedure: SMALL BOWEL RESECTION;  Surgeon: Rolm Bookbinder, MD;  Location: [facility];  Service: General;  Laterality: N/A;  . CATARACT EXTRACTION Bilateral   . CORONARY ARTERY BYPASS GRAFT  2002  . IR FLUORO GUIDE PORT INSERTION RIGHT  08/20/2016  . IR US GUIDE VASC ACCESS RIGHT  08/20/2016  . LAPAROSCOPY N/A 07/12/2016   Procedure: LAPAROSCOPY DIAGNOSTIC;  Surgeon: Rolm Bookbinder, MD;  Location: [facility];  Service: General;  Laterality: N/A;  . LEFT HEART CATHETERIZATION WITH CORONARY/GRAFT ANGIOGRAM N/A 07/26/2012   Procedure: LEFT HEART CATHETERIZATION WITH Beatrix Fetters;  Surgeon: Wellington Hampshire, MD;  Location: [facility] CATH LAB;  Service: Cardiovascular;  Laterality: N/A;  . LUNG BIOPSY    . PERCUTANEOUS CORONARY STENT INTERVENTION (PCI-S)  07/26/2012   Procedure: PERCUTANEOUS CORONARY STENT INTERVENTION (PCI-S);  Surgeon: Wellington Hampshire, MD;  Location[facility] CATH [facility];  Service: Cardiovascular;;  . RENAL BIOPSY    . TOE AMPUTATION  2009   hammer toe      Current  Facility-Administered Medications  Medication Dose Route Frequency Provider Last Rate Last Dose  . piperacillin-tazobactam (ZOSYN) IVPB 3.375 g  3.375 g Intravenous Once Isla Pence, MD      . sodium chloride 0.9 % bolus 1,000 mL  1,000 mL Intravenous Once Isla Pence, MD       Current Outpatient Prescriptions  Medication Sig Dispense Refill  . acetaminophen (TYLENOL) 500 MG tablet Take 500-1,000 mg by mouth daily as needed for moderate pain or headache.    [name] aspirin EC 81 MG EC tablet Take 1 tablet (81 mg total) by mouth daily. 30 tablet   . clopidogrel (PLAVIX) 75 MG tablet Take 1 tablet (75 mg total) by mouth daily. 90 tablet 2  . dexamethasone (DECADRON)  4 MG tablet Take 1 tablet (4 mg total) by mouth 2 (two) times daily with a meal. 20 tablet 0  . finasteride (PROSCAR) 5 MG tablet Take 5 mg by mouth at bedtime.     . furosemide (LASIX) 20 MG tablet Take 20 mg by mouth daily.    [name] levothyroxine (SYNTHROID, LEVOTHROID) 100 MCG tablet Take 100 mcg by mouth daily.      [name] lidocaine-prilocaine (EMLA) cream Apply to affected area once 30 g 3  . metoprolol tartrate (LOPRESSOR) 25 MG tablet TAKE ONE-HALF TABLET BY  MOUTH TWO TIMES DAILY 90 tablet 1  . Multiple Vitamin (MULTIVITAMIN) tablet Take 1 tablet by mouth daily.      . pantoprazole (PROTONIX) 40 MG tablet TAKE 1 TABLET BY MOUTH  DAILY 90 tablet 1  . predniSONE (DELTASONE) 20 MG tablet Take 3 tablets (60 mg total) by mouth daily. Take on days 1-5 of chemotherapy. 15 tablet 6  . Tamsulosin HCl (FLOMAX) 0.4 MG CAPS Take 0.4 mg by mouth at bedtime.     [name] telmisartan (MICARDIS) 40 MG tablet Take 20 mg by mouth daily.     [name] LORazepam (ATIVAN) 0.5 MG tablet Take 1 tablet (0.5 mg total) by mouth every 8 (eight) hours as needed for anxiety or sleep. (Patient not taking: Reported on 10/30/2016) 30 tablet 0  . NITROSTAT 0.4 MG SL tablet PLACE 1 TABLET (0.4 MG TOTAL) UNDER THE TONGUE EVERY 5 (FIVE) MINUTES AS NEEDED. MAY REPEAT X3 (Patient taking  differently: Place 0.4 mg under the tongue every 5 (five) minutes as needed for chest pain. May repeat x3) 25 tablet 2  . ondansetron (ZOFRAN) 8 MG tablet Take 1 tablet (8 mg total) by mouth 2 (two) times daily as needed for refractory nausea / vomiting. Start on day 3 after cyclophosphamide. (Patient not taking: Reported on 10/30/2016) 30 tablet 1  . prochlorperazine (COMPAZINE) 10 MG tablet Take 1 tablet (10 mg total) by mouth every 6 (six) hours as needed (Nausea or vomiting). (Patient not taking: Reported on 10/30/2016) 30 tablet 6  . triamcinolone (KENALOG) 0.025 % cream Apply topically 2 (two) times daily. (Patient not taking: Reported on 10/30/2016) 30 g 0   Facility-Administered Medications Ordered in Other Encounters  Medication Dose Route Frequency Provider Last Rate Last Dose  . sodium chloride flush (NS) 0.9 % injection 10 mL  10 mL Intracatheter PRN Brunetta Genera, MD   10 mL at 08/24/16 1653  . sodium chloride flush (NS) 0.9 % injection 10 mL  10 mL Intracatheter PRN Brunetta Genera, MD   10 mL at 10/07/16 1555      Allergies  Allergen Reactions  . No Known Allergies     REVIEW OF SYSTEMS: Skin:  No history of rash.  No history of abnormal moles. Infection:  No history of hepatitis or HIV.  No history of MRSA. Neurologic:  No history of stroke.  No history of seizure.  No history of headaches. Cardiac:  CAD.  Had CABG x 6 in 05/2000.  Coronary stent - 07/2012  Myocardial perfusion - 06/30/2016 - EF - 40%.  There is a large defect of severe severity present in the basal inferolateral, basal anterolateral, mid inferolateral and mid anterolateral location.  Findings consistent with prior myocardial infarction. Pulmonary:  History of BOOP - s/b VATS in 2008.  Was seen for pulmonary clearance by Dr. Lake Bells in 07/01/2016.  Endocrine:  No diabetes. No thyroid disease. Gastrointestinal:  See HPI Urologic:  CKI, followed  by Dr. Marval Regal. Musculoskeletal:  No history of joint  or back pain. Hematologic:  On Plavix.  Anemia - hgb- 8.9 - 10/30/2016.  Large B Cell Lymphoma.  Has had 4 courses of treatment.  PET scan on 10/22/2016 shows good response to chemotx. Psycho-social:  The patient is oriented.   The patient has no obvious psychologic or social impairment to understanding our conversation and plan.  SOCIAL and FAMILY HISTORY: Married. Has 3 children:  Son, 40's, who lives at [place], daughter, 59's, who lives in [place], and daughter, 59 yo, who lives in [place].  No family history of diverticular disease.  PHYSICAL EXAM: BP 126/69   Pulse 72   Temp 97.6 F (36.4 C) (Oral)   Resp 15   SpO2 94%   General: Obese WM who is alert.  He does not look that healthy, but is comfortable and not in that much pain.  Skin:  Inspection and palpation - no mass or rash. Eyes:  Conjunctiva and lids unremarkable.            Pupils are equal Ears, Nose, Mouth, and Throat:  Ears and nose unremarkable            Lips and teeth are unremarable. Neck: Supple. No mass, trachea midline.  No thyroid mass. Lymph Nodes:  No supraclavicular, cervical, or inguinal nodes. Lungs: Normal respiratory effort.  Clear to auscultation and symmetric breath sounds. Heart:  Palpation of the heart is normal.            Auscultation: RRR. No murmur or rub.  Median sternotomy scar.  Port in right upper chest.  Abdomen: No mass.  No hernia.   Obese.  Midline scar.  Tenderness in the mid left abdomen - consistent with location of perforation on CT scan.  No peritoneal sxes.  Has a few BS.       Rectal: Not done. Musculoskeletal:  [place] muscle strength and ROM  in upper and lower extremities. Neurologic:  Grossly intact to motor and sensory function. Psychiatric: Normal judgement and insight. Behavior is normal.            Oriented to time, person, place.   DATA REVIEWED, COUNSELING AND COORDINATION OF CARE: Epic notes reviewed. Counseling and coordination of care exceeded more  than 50% of the time spent with patient. Total time spent with patient and charting: 60 minutes  Alphonsa Overall, MD,  [facility], [address] [address]  [place], [place]    [place] Phone:  [phone] FAX:  [phone]

## 2016-10-30 NOTE — ED Provider Notes (Signed)
[facility] DEPT Provider Note   CSN: [id number] Arrival date & time: 10/30/16  1029     History   Chief Complaint Chief Complaint  Patient presents with  . Abdominal Pain  . Cancer    HPI Luke Mcbride is a 81 y.o. male.  Pt presents to the ED today with llq abdominal pain.  He does have a hx of lymphoma and is currently on chemo.  His last treatment was 6/14.  The pt said the llq pain started around 1800 last night.  The pt denies any n/v.  The pt denies diarrhea or constipation.  He has never had anything like this in the past.      Past Medical History:  Diagnosis Date  . ALLERGIC RHINITIS   . Anemia    hx  . Barrett's esophagus   . BOOP (bronchiolitis obliterans with organizing pneumonia) ([place])    a. s/p R VATS 2008.  [name] CAD (coronary artery disease)    a. 05/2000: NSTEMI/CABG x 6: LIMA->LAD, VG->D1, VG->OM1->2, VG->PDA->RPL;  b. 07/2007 MV: high lat infarct, no ischemia, EF 47%;  c. Cath/PCI: LM nl, LAD20p, 22m, D1 nl, D2 60-70ost, D3 nl, LCX 70ost, 16m, OM1/OM2 min irregs, RCA 30 diff, PDA 99, LIMA->LAD atretic, VG->D1 100, VG->OM1->2 100, VG->PDA->RPL 90p (4.0x23 Vision BMS);  c. 07/2012 Echo: EF 55%, gr1 DD.  [name] Cataract   . CKD (chronic kidney disease), stage III    a. renal bx 2008: GLN with vasculitis  . Diffuse large B cell lymphoma ([place]) 08/07/2016  . GERD (gastroesophageal reflux disease)   . Glaucoma    a. Cannot see out of L eye.  . Hematuria    Microscopic  . Hyperglycemia    Patient reported while on prednisone, had to take insulin  . Hyperlipemia   . Hypertension   . Hypothyroidism   . ILD (interstitial lung disease) ([place])   . Iritis   . Local infection of skin and subcutaneous tissue   . Membranoproliferative nephritis   . Myocardial infarction ([place]) 2002  . Neutropenia, drug-induced ([place])   . Recurrent boils   . Residual foreign body in soft tissue   . Vasculitis ([place])    a.  pauciimmune vasculitis with renal involvement and hx of  transient hemoptysis in the past with associated BOOP, 2008 (renal bx 2008: GLN with vasculitis). b. History of treatment with 2 cycles of Cytoxan and pheresis. H/o hemoptysis and pulm hemorrhage with 2nd cycle of cytoxan.    Patient Active Problem List   Diagnosis Date Noted  . Counseling regarding advanced care planning and goals of care 09/16/2016  . Port catheter in place 09/15/2016  . Acute lower UTI 09/02/2016  . Pancytopenia ([place]) 09/02/2016  . Neutropenia with fever ([place]) 09/02/2016  . Sepsis ([place]) 09/02/2016  . Chemotherapy-induced neutropenia ([place])   . Diffuse large B cell lymphoma ([place]) 08/07/2016  . S/P small bowel resection 07/12/2016  . Spinal stenosis 07/08/2015  . CKD (chronic kidney disease), stage III 07/29/2012  . NSTEMI (non-ST elevated myocardial infarction) ([place]) 07/29/2012  . Vasculitis ([place])   . ALLERGIC RHINITIS 07/16/2010  . HYPERCHOLESTEROLEMIA 12/04/2008  . ANEMIA 12/04/2008  . IRITIS 12/04/2008  . RENAL INSUFFICIENCY 12/04/2008  . Coronary atherosclerosis 07/23/2008  . GERD 04/15/2008  . Barrett's esophagus 04/15/2008  . HYPERLIPIDEMIA 07/20/2007  . Essential hypertension, benign 05/23/2007  . FOREIGN BODY, SOFT TISSUE, RESIDUAL 04/24/2007  . INTERSTITIAL LUNG DISEASE 02/05/2007  . RESTLESS LEG SYNDROME, SEVERE 01/26/2007  . NEPHRITIS, MEMBRANOPROLIFERATIVE 01/26/2007  .  Hypothyroidism 11/30/2006  . DIABETES MELLITUS, TYPE II 11/30/2006  . Myocardial infarction ([place]) 05/17/2000    Past Surgical History:  Procedure Laterality Date  . BOWEL RESECTION N/A 07/12/2016   Procedure: SMALL BOWEL RESECTION;  Surgeon: Rolm Bookbinder, MD;  Location: [facility];  Service: General;  Laterality: N/A;  . CATARACT EXTRACTION Bilateral   . CORONARY ARTERY BYPASS GRAFT  2002  . IR FLUORO GUIDE PORT INSERTION RIGHT  08/20/2016  . IR US GUIDE VASC ACCESS RIGHT  08/20/2016  . LAPAROSCOPY N/A 07/12/2016   Procedure: LAPAROSCOPY DIAGNOSTIC;  Surgeon: Rolm Bookbinder, MD;   Location: [facility];  Service: General;  Laterality: N/A;  . LEFT HEART CATHETERIZATION WITH CORONARY/GRAFT ANGIOGRAM N/A 07/26/2012   Procedure: LEFT HEART CATHETERIZATION WITH Beatrix Fetters;  Surgeon: Wellington Hampshire, MD;  Location: [facility] CATH LAB;  Service: Cardiovascular;  Laterality: N/A;  . LUNG BIOPSY    . PERCUTANEOUS CORONARY STENT INTERVENTION (PCI-S)  07/26/2012   Procedure: PERCUTANEOUS CORONARY STENT INTERVENTION (PCI-S);  Surgeon: Wellington Hampshire, MD;  Location: [facility] CATH LAB;  Service: Cardiovascular;;  . RENAL BIOPSY    . TOE AMPUTATION  2009   hammer toe       Home Medications    Prior to Admission medications   Medication Sig Start Date End Date Taking? Authorizing Provider  acetaminophen (TYLENOL) 500 MG tablet Take 500-1,000 mg by mouth daily as needed for moderate pain or headache.   Yes [provider]  aspirin EC 81 MG EC tablet Take 1 tablet (81 mg total) by mouth daily. 07/29/12  Yes Rogelia Mire, NP  clopidogrel (PLAVIX) 75 MG tablet Take 1 tablet (75 mg total) by mouth daily. 02/09/16  Yes [name], MD  dexamethasone (DECADRON) 4 MG tablet Take 1 tablet (4 mg total) by mouth 2 (two) times daily with a meal. 08/13/16  Yes Brunetta Genera, MD  finasteride (PROSCAR) 5 MG tablet Take 5 mg by mouth at bedtime.    Yes [provider]  furosemide (LASIX) 20 MG tablet Take 20 mg by mouth daily. 09/20/16  Yes [provider]  levothyroxine (SYNTHROID, LEVOTHROID) 100 MCG tablet Take 100 mcg by mouth daily.     Yes [provider]  lidocaine-prilocaine (EMLA) cream Apply to affected area once 08/13/16  Yes Brunetta Genera, MD  metoprolol tartrate (LOPRESSOR) 25 MG tablet TAKE ONE-HALF TABLET BY  MOUTH TWO TIMES DAILY 10/05/16  Yes [name], MD  Multiple Vitamin (MULTIVITAMIN) tablet Take 1 tablet by mouth daily.     Yes [provider]  pantoprazole (PROTONIX) 40 MG tablet TAKE 1 TABLET BY MOUTH   DAILY 09/23/16  Yes [name], MD  predniSONE (DELTASONE) 20 MG tablet Take 3 tablets (60 mg total) by mouth daily. Take on days 1-5 of chemotherapy. 08/13/16  Yes Brunetta Genera, MD  Tamsulosin HCl (FLOMAX) 0.4 MG CAPS Take 0.4 mg by mouth at bedtime.    Yes [provider]  telmisartan (MICARDIS) 40 MG tablet Take 20 mg by mouth daily.    Yes [provider]  LORazepam (ATIVAN) 0.5 MG tablet Take 1 tablet (0.5 mg total) by mouth every 8 (eight) hours as needed for anxiety or sleep. Patient not taking: Reported on 10/30/2016 08/31/16   Brunetta Genera, MD  NITROSTAT 0.4 MG SL tablet PLACE 1 TABLET (0.4 MG TOTAL) UNDER THE TONGUE EVERY 5 (FIVE) MINUTES AS NEEDED. MAY REPEAT X3 Patient taking differently: Place 0.4 mg under the  tongue every 5 (five) minutes as needed for chest pain. May repeat x3 02/10/16   [name], MD  ondansetron (ZOFRAN) 8 MG tablet Take 1 tablet (8 mg total) by mouth 2 (two) times daily as needed for refractory nausea / vomiting. Start on day 3 after cyclophosphamide. Patient not taking: Reported on 10/30/2016 08/13/16   Brunetta Genera, MD  prochlorperazine (COMPAZINE) 10 MG tablet Take 1 tablet (10 mg total) by mouth every 6 (six) hours as needed (Nausea or vomiting). Patient not taking: Reported on 10/30/2016 08/13/16   Brunetta Genera, MD  triamcinolone (KENALOG) 0.025 % cream Apply topically 2 (two) times daily. Patient not taking: Reported on 10/30/2016 09/05/16   Annita Brod, MD    Family History Family History  Problem Relation Age of Onset  . Heart disease Mother   . Diabetes Mother   . Prostate cancer Father   . Depression Other   . Diabetes Other   . Prostate cancer Other   . Colon polyps Neg Hx   . Colon cancer Neg Hx   . Rectal cancer Neg Hx   . Stomach cancer Neg Hx   . Esophageal cancer Neg Hx     Social History Social History  Substance Use Topics  . Smoking status: Never Smoker  . Smokeless  tobacco: Never Used  . Alcohol use No     Allergies   No known allergies   Review of Systems Review of Systems  Gastrointestinal: Positive for abdominal pain.  All other systems reviewed and are negative.    Physical Exam Updated Vital Signs BP 126/69   Pulse 72   Temp 97.6 F (36.4 C) (Oral)   Resp 15   SpO2 94%   Physical Exam  Constitutional: He is oriented to person, place, and time. He appears well-developed and well-nourished.  HENT:  Head: Normocephalic and atraumatic.  Right Ear: External ear normal.  Left Ear: External ear normal.  Nose: Nose normal.  Mouth/Throat: Oropharynx is clear and moist.  Eyes: Conjunctivae and EOM are normal. Pupils are equal, round, and reactive to light.  Neck: Normal range of motion. Neck supple.  Cardiovascular: Normal rate, regular rhythm, normal heart sounds and intact distal pulses.   Pulmonary/Chest: Effort normal and breath sounds normal.  Abdominal: Soft. There is tenderness in the left lower quadrant.  Musculoskeletal: Normal range of motion.  Neurological: He is alert and oriented to person, place, and time.  Skin: Skin is warm.  Psychiatric: He has a normal mood and affect. His behavior is normal. Judgment and thought content normal.  Nursing note and vitals reviewed.    ED Treatments / Results  Labs (all labs ordered are listed, but only abnormal results are displayed) Labs Reviewed  COMPREHENSIVE METABOLIC PANEL - Abnormal; Notable for the following:       Result Value   Glucose, Bld 176 (*)    BUN 54 (*)    Creatinine, Ser 2.00 (*)    Calcium 8.6 (*)    Total Protein 5.4 (*)    Albumin 3.1 (*)    GFR calc non Af Amer 30 (*)    GFR calc Af Amer 35 (*)    All other components within normal limits  CBC - Abnormal; Notable for the following:    WBC 35.7 (*)    RBC 2.72 (*)    Hemoglobin 8.9 (*)    HCT 27.1 (*)    RDW 19.9 (*)    All other components within  normal limits  LIPASE, BLOOD  URINALYSIS,  ROUTINE W REFLEX MICROSCOPIC  I-STAT CG4 LACTIC ACID, ED    EKG  EKG Interpretation None       Radiology Ct Abdomen Pelvis Wo Contrast  Result Date: 10/30/2016 CLINICAL DATA:  Left lower quadrant pain. History of lymphoma with ongoing chemotherapy. EXAM: CT ABDOMEN AND PELVIS WITHOUT CONTRAST TECHNIQUE: Multidetector CT imaging of the abdomen and pelvis was performed following the standard protocol without IV contrast. COMPARISON:  06/15/2016 CT abdomen/ pelvis.  10/22/2016 head CT. FINDINGS: Lower chest: No significant pulmonary nodules or acute consolidative airspace disease. Intact lower sternotomy wires. Coronary atherosclerosis. Low mediastinal blood pool density, indicating anemia. Hepatobiliary: Normal liver size. Several subcentimeter hypodense lesions scattered throughout the liver are too small to characterize and not appreciably changed. No appreciable new liver lesions. Normal gallbladder with no radiopaque cholelithiasis. No biliary ductal dilatation. Pancreas: Normal, with no mass or duct dilation. Spleen: Normal size. No mass. Adrenals/Urinary Tract: Normal adrenals. No renal stones. No hydronephrosis. Small simple bilateral renal cysts, largest 2.4 cm in the posterior interpolar right kidney. Hypodense posterior upper right renal cortical lesion is too small to characterize and not appreciably changed, compatible with a benign renal cysts. No additional contour deforming renal lesions. Normal bladder. Stomach/Bowel: Small hiatal hernia. Otherwise collapsed and grossly normal stomach. Normal caliber small bowel with no small bowel wall thickening. Normal appendix. Moderate colonic diverticulosis, most prominent in the descending and sigmoid colon. Focal mild colonic wall thickening in the descending colon with associated mild pericolonic fat stranding, compatible with acute descending colon diverticulitis. There is associated pericolonic free air at the site of diverticulitis.  Vascular/Lymphatic: Atherosclerotic abdominal aorta with stable 3.3 cm infrarenal abdominal aortic aneurysm. No pathologically enlarged lymph nodes in the abdomen or pelvis. Reproductive: Top-normal size prostate. Other: Scattered pneumoperitoneum throughout the anterior and upper peritoneal cavity. No ascites. No focal fluid collections. Stable periumbilical subcutaneous scarring. Musculoskeletal: No aggressive appearing focal osseous lesions. Marked thoracolumbar spondylosis. IMPRESSION: 1. Acute perforated diverticulitis of the descending colon. Scattered free air throughout the peritoneal cavity. No focal drainable abscess . 2. Aortic atherosclerosis. Stable 3.3 cm infrarenal abdominal aortic aneurysm. Recommend followup by ultrasound in 3 years. This recommendation follows ACR consensus guidelines: White Paper of the ACR Incidental Findings Committee II on Vascular Findings. J Am Coll Radiol 2013; 10:789-794. 3. Additional chronic findings as above. Critical Value/emergent results were called by telephone at the time of interpretation on 10/30/2016 at 2:14 pm to Dr. Isla Pence , who verbally acknowledged these results. Electronically Signed   By: Ilona Sorrel M.D.   On: 10/30/2016 14:16    Procedures Procedures (including critical care time)  Medications Ordered in ED Medications  sodium chloride 0.9 % bolus 1,000 mL (not administered)  piperacillin-tazobactam (ZOSYN) IVPB 3.375 g (not administered)  fentaNYL (SUBLIMAZE) injection 50 mcg (not administered)  sodium chloride 0.9 % bolus 1,000 mL (1,000 mLs Intravenous New Bag/Given 10/30/16 1306)  ondansetron (ZOFRAN) injection 4 mg (4 mg Intravenous Given 10/30/16 1303)  fentaNYL (SUBLIMAZE) injection 50 mcg (50 mcg Intravenous Given 10/30/16 1329)     Initial Impression / Assessment and Plan / ED Course  I have reviewed the triage vital signs and the nursing notes.  Pertinent labs & imaging results that were available during my care of the  patient were reviewed by me and considered in my medical decision making (see chart for details).    Pt d/w Dr. Lucia Gaskins (surgery) who will see him.  No OR tonight  b/c he is on plavix.  The pt was d/w Dr. [place] Pink for admission.  Final Clinical Impressions(s) / ED Diagnoses   Final diagnoses:  Diverticulitis of large intestine with perforation without bleeding  Immunosuppressed due to chemotherapy  CRI (chronic renal insufficiency), stage 3 (moderate)    New Prescriptions New Prescriptions   No medications on file     Isla Pence, MD 10/30/16 1459

## 2016-10-30 NOTE — H&P (Addendum)
[facility] History and Physical  Luke Mcbride DTO:[id number] DOB: 16-Dec-1935 DOA: 10/30/2016   PCP: Donato Heinz, MD  Specialists: Dr. Irene Limbo is his oncologist, Dr. [name] is his cardiologist.   Chief Complaint: Pain in the abdomen  HPI: Luke Mcbride is a 81 y.o. male with a past medical history of chronic kidney disease stage III, history of coronary artery disease status post bypass and stent, last stents placed in 2014, history of non-Hodgkin's lymphoma undergoing chemotherapy with last chemotherapy given on June 12, who presents with complaints of abdominal pain that started last night. Located in the left lower quadrant. It has been up to 10 out of 10 in intensity. Sharp pain. No radiation. Patient denies any nausea, vomiting. No diarrhea. No blood in the stool. Denies any dysuria. No chest pain or shortness of breath. Symptoms continued to get worse, so he decided to come in to the emergency department.  In the emergency department patient underwent blood work, which showed significantly high WBC. So CT scan was done which showed perforated diverticulitis.  Home Medications: Prior to Admission medications   Medication Sig Start Date End Date Taking? Authorizing Provider  acetaminophen (TYLENOL) 500 MG tablet Take 500-1,000 mg by mouth daily as needed for moderate pain or headache.   Yes [provider]  aspirin EC 81 MG EC tablet Take 1 tablet (81 mg total) by mouth daily. 07/29/12  Yes Rogelia Mire, NP  clopidogrel (PLAVIX) 75 MG tablet Take 1 tablet (75 mg total) by mouth daily. 02/09/16  Yes [name], MD  dexamethasone (DECADRON) 4 MG tablet Take 1 tablet (4 mg total) by mouth 2 (two) times daily with a meal. 08/13/16  Yes Brunetta Genera, MD  finasteride (PROSCAR) 5 MG tablet Take 5 mg by mouth at bedtime.    Yes [provider]  furosemide (LASIX) 20 MG tablet Take 20 mg by mouth daily. 09/20/16  Yes [provider]    levothyroxine (SYNTHROID, LEVOTHROID) 100 MCG tablet Take 100 mcg by mouth daily.     Yes [provider]  lidocaine-prilocaine (EMLA) cream Apply to affected area once 08/13/16  Yes Brunetta Genera, MD  metoprolol tartrate (LOPRESSOR) 25 MG tablet TAKE ONE-HALF TABLET BY  MOUTH TWO TIMES DAILY 10/05/16  Yes [name], MD  Multiple Vitamin (MULTIVITAMIN) tablet Take 1 tablet by mouth daily.     Yes [provider]  pantoprazole (PROTONIX) 40 MG tablet TAKE 1 TABLET BY MOUTH  DAILY 09/23/16  Yes [name], MD  predniSONE (DELTASONE) 20 MG tablet Take 3 tablets (60 mg total) by mouth daily. Take on days 1-5 of chemotherapy. 08/13/16  Yes Brunetta Genera, MD  Tamsulosin HCl (FLOMAX) 0.4 MG CAPS Take 0.4 mg by mouth at bedtime.    Yes [provider]  telmisartan (MICARDIS) 40 MG tablet Take 20 mg by mouth daily.    Yes [provider]  LORazepam (ATIVAN) 0.5 MG tablet Take 1 tablet (0.5 mg total) by mouth every 8 (eight) hours as needed for anxiety or sleep. Patient not taking: Reported on 10/30/2016 08/31/16   Brunetta Genera, MD  NITROSTAT 0.4 MG SL tablet PLACE 1 TABLET (0.4 MG TOTAL) UNDER THE TONGUE EVERY 5 (FIVE) MINUTES AS NEEDED. MAY REPEAT X3 Patient taking differently: Place 0.4 mg under the tongue every 5 (five) minutes as needed for chest pain. May repeat x3 02/10/16   [name], MD  ondansetron (ZOFRAN) 8 MG tablet Take  1 tablet (8 mg total) by mouth 2 (two) times daily as needed for refractory nausea / vomiting. Start on day 3 after cyclophosphamide. Patient not taking: Reported on 10/30/2016 08/13/16   Brunetta Genera, MD  prochlorperazine (COMPAZINE) 10 MG tablet Take 1 tablet (10 mg total) by mouth every 6 (six) hours as needed (Nausea or vomiting). Patient not taking: Reported on 10/30/2016 08/13/16   Brunetta Genera, MD  triamcinolone (KENALOG) 0.025 % cream Apply topically 2 (two) times daily. Patient not taking:  Reported on 10/30/2016 09/05/16   Annita Brod, MD    Allergies:  Allergies  Allergen Reactions  . No Known Allergies     Past Medical History: Past Medical History:  Diagnosis Date  . ALLERGIC RHINITIS   . Anemia    hx  . Barrett's esophagus   . BOOP (bronchiolitis obliterans with organizing pneumonia) ([place])    a. s/p R VATS 2008.  [name] CAD (coronary artery disease)    a. 05/2000: NSTEMI/CABG x 6: LIMA->LAD, VG->D1, VG->OM1->2, VG->PDA->RPL;  b. 07/2007 MV: high lat infarct, no ischemia, EF 47%;  c. Cath/PCI: LM nl, LAD20p, 84m, D1 nl, D2 60-70ost, D3 nl, LCX 70ost, 177m, OM1/OM2 min irregs, RCA 30 diff, PDA 99, LIMA->LAD atretic, VG->D1 100, VG->OM1->2 100, VG->PDA->RPL 90p (4.0x23 Vision BMS);  c. 07/2012 Echo: EF 55%, gr1 DD.  [name] Cataract   . CKD (chronic kidney disease), stage III    a. renal bx 2008: GLN with vasculitis  . Diffuse large B cell lymphoma ([place]) 08/07/2016  . GERD (gastroesophageal reflux disease)   . Glaucoma    a. Cannot see out of L eye.  . Hematuria    Microscopic  . Hyperglycemia    Patient reported while on prednisone, had to take insulin  . Hyperlipemia   . Hypertension   . Hypothyroidism   . ILD (interstitial lung disease) ([place])   . Iritis   . Local infection of skin and subcutaneous tissue   . Membranoproliferative nephritis   . Myocardial infarction ([place]) 2002  . Neutropenia, drug-induced ([place])   . Recurrent boils   . Residual foreign body in soft tissue   . Vasculitis ([place])    a.  pauciimmune vasculitis with renal involvement and hx of transient hemoptysis in the past with associated BOOP, 2008 (renal bx 2008: GLN with vasculitis). b. History of treatment with 2 cycles of Cytoxan and pheresis. H/o hemoptysis and pulm hemorrhage with 2nd cycle of cytoxan.    Past Surgical History:  Procedure Laterality Date  . BOWEL RESECTION N/A 07/12/2016   Procedure: SMALL BOWEL RESECTION;  Surgeon: Rolm Bookbinder, MD;  Location: [facility];  Service: General;   Laterality: N/A;  . CATARACT EXTRACTION Bilateral   . CORONARY ARTERY BYPASS GRAFT  2002  . IR FLUORO GUIDE PORT INSERTION RIGHT  08/20/2016  . IR US GUIDE VASC ACCESS RIGHT  08/20/2016  . LAPAROSCOPY N/A 07/12/2016   Procedure: LAPAROSCOPY DIAGNOSTIC;  Surgeon: Rolm Bookbinder, MD;  Location: [facility];  Service: General;  Laterality: N/A;  . LEFT HEART CATHETERIZATION WITH CORONARY/GRAFT ANGIOGRAM N/A 07/26/2012   Procedure: LEFT HEART CATHETERIZATION WITH Beatrix Fetters;  Surgeon: Wellington Hampshire, MD;  Location: [facility] CATH LAB;  Service: Cardiovascular;  Laterality: N/A;  . LUNG BIOPSY    . PERCUTANEOUS CORONARY STENT INTERVENTION (PCI-S)  07/26/2012   Procedure: PERCUTANEOUS CORONARY STENT INTERVENTION (PCI-S);  Surgeon: Wellington Hampshire, MD;  Location: [facility] CATH LAB;  Service: Cardiovascular;;  . RENAL BIOPSY    .  TOE AMPUTATION  2009   hammer toe    Social History: Lives in [place] with his wife. No smoking, alcohol use, illicit drug use. Independent with daily activities.   Family History:  Family History  Problem Relation Age of Onset  . Heart disease Mother   . Diabetes Mother   . Prostate cancer Father   . Depression Other   . Diabetes Other   . Prostate cancer Other   . Colon polyps Neg Hx   . Colon cancer Neg Hx   . Rectal cancer Neg Hx   . Stomach cancer Neg Hx   . Esophageal cancer Neg Hx      Review of Systems - History obtained from the patient General ROS: positive for  - fatigue Psychological ROS: negative Ophthalmic ROS: negative ENT ROS: negative Allergy and Immunology ROS: negative Hematological and Lymphatic ROS: negative Endocrine ROS: negative Respiratory ROS: no cough, shortness of breath, or wheezing Cardiovascular ROS: no chest pain or dyspnea on exertion Gastrointestinal ROS: as in hpi Genito-Urinary ROS: no dysuria, trouble voiding, or hematuria Musculoskeletal ROS: negative Neurological ROS: no TIA or stroke symptoms Dermatological  ROS: negative  Physical Examination  Vitals:   10/30/16 1300 10/30/16 1331 10/30/16 1400 10/30/16 1430  BP: 119/67 129/66 125/71 126/69  Pulse: 73 60 71 72  Resp: 18 16 (!) 21 15  Temp:      TempSrc:      SpO2: 97% 95% 95% 94%    BP 126/69   Pulse 72   Temp 97.6 F (36.4 C) (Oral)   Resp 15   SpO2 94%   General appearance: alert, cooperative, appears stated age and no distress Head: Normocephalic, without obvious abnormality, atraumatic Eyes: conjunctivae/corneas clear. PERRL, EOM's intact.  Throat: lips, mucosa, and tongue normal; teeth and gums normal Neck: no adenopathy, no carotid bruit, no JVD, supple, symmetrical, trachea midline and thyroid not enlarged, symmetric, no tenderness/mass/nodules Resp: clear to auscultation bilaterally Cardio: regular rate and rhythm, S1, S2 normal, no murmur, click, rub or gallop GI: Abdomen is soft. Tender in the left lower quadrant without any rebound, rigidity or guarding. No masses or organomegaly. Bowel sounds are present. Extremities: Minimal edema bilateral lower extremities. This is stable per patient Pulses: 2+ and symmetric Skin: Skin color, texture, turgor normal. No rashes or lesions Neurologic: No focal neurological deficit   Labs on Admission: I have personally reviewed following labs and imaging studies  CBC:  Recent Labs Lab 10/26/16 1032 10/30/16 1043  WBC 10.1 35.7*  NEUTROABS 9.4*  --   HGB 10.1* 8.9*  HCT 31.7* 27.1*  MCV 101.0* 99.6  PLT 189 607   Basic Metabolic Panel:  Recent Labs Lab 10/26/16 1032 10/30/16 1043  NA 136 137  K 4.5 4.1  CL  --  108  CO2 23 22  GLUCOSE 135 176*  BUN 32.0* 54*  CREATININE 1.9* 2.00*  CALCIUM 9.1 8.6*   GFR: Estimated Creatinine Clearance: 37.2 mL/min (A) (by C-G formula based on SCr of 2 mg/dL (H)). Liver Function Tests:  Recent Labs Lab 10/26/16 1032 10/30/16 1043  AST 15 27  ALT 18 32  ALKPHOS 83 59  BILITOT 0.40 0.6  PROT 6.5 5.4*  ALBUMIN 3.2*  3.1*    Recent Labs Lab 10/30/16 1043  LIPASE 37     Radiological Exams on Admission: Ct Abdomen Pelvis Wo Contrast  Result Date: 10/30/2016 CLINICAL DATA:  Left lower quadrant pain. History of lymphoma with ongoing chemotherapy. EXAM: CT ABDOMEN AND PELVIS  WITHOUT CONTRAST TECHNIQUE: Multidetector CT imaging of the abdomen and pelvis was performed following the standard protocol without IV contrast. COMPARISON:  06/15/2016 CT abdomen/ pelvis.  10/22/2016 head CT. FINDINGS: Lower chest: No significant pulmonary nodules or acute consolidative airspace disease. Intact lower sternotomy wires. Coronary atherosclerosis. Low mediastinal blood pool density, indicating anemia. Hepatobiliary: Normal liver size. Several subcentimeter hypodense lesions scattered throughout the liver are too small to characterize and not appreciably changed. No appreciable new liver lesions. Normal gallbladder with no radiopaque cholelithiasis. No biliary ductal dilatation. Pancreas: Normal, with no mass or duct dilation. Spleen: Normal size. No mass. Adrenals/Urinary Tract: Normal adrenals. No renal stones. No hydronephrosis. Small simple bilateral renal cysts, largest 2.4 cm in the posterior interpolar right kidney. Hypodense posterior upper right renal cortical lesion is too small to characterize and not appreciably changed, compatible with a benign renal cysts. No additional contour deforming renal lesions. Normal bladder. Stomach/Bowel: Small hiatal hernia. Otherwise collapsed and grossly normal stomach. Normal caliber small bowel with no small bowel wall thickening. Normal appendix. Moderate colonic diverticulosis, most prominent in the descending and sigmoid colon. Focal mild colonic wall thickening in the descending colon with associated mild pericolonic fat stranding, compatible with acute descending colon diverticulitis. There is associated pericolonic free air at the site of diverticulitis. Vascular/Lymphatic:  Atherosclerotic abdominal aorta with stable 3.3 cm infrarenal abdominal aortic aneurysm. No pathologically enlarged lymph nodes in the abdomen or pelvis. Reproductive: Top-normal size prostate. Other: Scattered pneumoperitoneum throughout the anterior and upper peritoneal cavity. No ascites. No focal fluid collections. Stable periumbilical subcutaneous scarring. Musculoskeletal: No aggressive appearing focal osseous lesions. Marked thoracolumbar spondylosis. IMPRESSION: 1. Acute perforated diverticulitis of the descending colon. Scattered free air throughout the peritoneal cavity. No focal drainable abscess . 2. Aortic atherosclerosis. Stable 3.3 cm infrarenal abdominal aortic aneurysm. Recommend followup by ultrasound in 3 years. This recommendation follows ACR consensus guidelines: White Paper of the ACR Incidental Findings Committee II on Vascular Findings. J Am Coll Radiol 2013; 10:789-794. 3. Additional chronic findings as above. Critical Value/emergent results were called by telephone at the time of interpretation on 10/30/2016 at 2:14 pm to Dr. Isla Pence , who verbally acknowledged these results. Electronically Signed   By: Ilona Sorrel M.D.   On: 10/30/2016 14:16    My interpretation of Electrocardiogram: Appears to be sinus rhythm in the 90s. Q wave in lead 3, which has been seen previously as well. No acute ST or T-wave changes otherwise.   Problem List  Principal Problem:   Diverticulitis of colon with perforation Active Problems:   Hypothyroidism   Essential hypertension, benign   INTERSTITIAL LUNG DISEASE   CKD (chronic kidney disease), stage III   Diffuse large B cell lymphoma (HCC)   Acute diverticulitis   Assessment: This is a 81 year old Caucasian male with a past medical history as stated earlier, who presents with left-sided abdominal pain and is found to have perforated diverticulitis.  Plan: #1 acute diverticulitis with perforation: Continue with Zosyn. General surgery  has been consulted. They would like for the patient to be off of Plavix before considering surgery. Keep nothing by mouth. Pain control.  #2 history of coronary artery disease status post CABG and stent placement: Stents, last placed in 2014. Safe to hold his Plavix for now. He is followed by cardiology. Underwent stress test earlier this year which showed scar without any active ischemia. Does not need any further cardiac workup prior to undergoing any surgical intervention if needed. Hold his Plavix.   #  3 history of for non-Hodgkin's lymphoma: Followed by oncology. Received chemotherapy on June 12. Also received Neulasta recently. That would explain his high WBC. He is on dexamethasone, which will be continued intravenously.  #4 history of essential hypertension: Continue metoprolol intravenously.  #5 history of chronic kidney disease, stage III: Followed by nephrology. Creatinine is close to his baseline. Monitor urine output. Check UA.  #6 history of hypothyroidism: Resume home medication when able to take orally.  #7 Hyperglycemia: Most likely due to steroids. Check his glucose level tomorrow morning.   #8 Normocytic anemia: Baseline hemoglobin appears to be around 10. Drop in hemoglobin could be due to chemotherapy. No overt bleeding has been reported. Continue to monitor closely. Transfuse as needed.  #9 history of BPH: Continue home medication when he is able to take orally.  #10. History of interstitial lung disease: Stable. Patient seen by pulmonology prior to his previous surgery. These are the recommendations for surgery: "1. Short duration of surgery as much as possible and avoid paralytic if possible 2. Recovery in step down or ICU with Pulmonary consultation 3. Aggressive pulmonary toilet with O2, bronchodilatation, and incentive spirometry and early ambulation"   DVT Prophylaxis: SCDs Code Status: Full code Family Communication: Discussed with the patient and his wife    Consults called: General surgery   Severity of Illness: The appropriate patient status for this patient is INPATIENT. Inpatient status is judged to be reasonable and necessary in order to provide the required intensity of service to ensure the patient's safety. The patient's presenting symptoms, physical exam findings, and initial radiographic and laboratory data in the context of their chronic comorbidities is felt to place them at high risk for further clinical deterioration. Furthermore, it is not anticipated that the patient will be medically stable for discharge from the hospital within 2 midnights of admission. The following factors support the patient status of inpatient.   " The patient's presenting symptoms include abdominal pain. " The worrisome physical exam findings include abdominal tenderness. " The initial radiographic and laboratory data are worrisome because of leukocytosis. " The chronic co-morbidities include chronic kidney disease.   * I certify that at the point of admission it is my clinical judgment that the patient will require inpatient hospital care spanning beyond 2 midnights from the point of admission due to high intensity of service, high risk for further deterioration and high frequency of surveillance required.*  Further management decisions will depend on results of further testing and patient's response to treatment.   [facility]  [facility] Pager [phone]  If 7PM-7AM, please contact night-coverage [email or web address] Password [facility]  10/30/2016, 4:03 PM

## 2016-10-30 NOTE — ED Notes (Signed)
Bradycardia noted with triage; [place] notified; pt being transferred to room for further evaluation.

## 2016-10-30 NOTE — Progress Notes (Signed)
Pharmacy Antibiotic Note  Luke Mcbride is a 81 y.o. male admitted on 10/30/2016 with IAI.  CT shows perforated diverticulitis. PMH significant for CKD, NHL undergoing chemotherapy (CEOP q21d, received cycle 4 6/12-6/14). Pharmacy has been consulted for Zosyn dosing. General surgery following.  Also of note, patient received Neulasta on 6/14.  Plan: Zosyn 3.375g IV q8h (4 hour infusion).  SCr close to baseline so do not anticipate dose adjustments. Will sign off at this time.  Please reconsult if a change in clinical status warrants re-evaluation of dosage.      Temp (24hrs), Avg:97.6 F (36.4 C), Min:97.5 F (36.4 C), Max:97.6 F (36.4 C)   Recent Labs Lab 10/26/16 1032 10/30/16 1043  WBC 10.1 35.7*  CREATININE 1.9* 2.00*    Estimated Creatinine Clearance: 37.2 mL/min (A) (by C-G formula based on SCr of 2 mg/dL (H)).    Allergies  Allergen Reactions  . No Known Allergies     Antimicrobials this admission: 6/16 Zosyn >>   Dose adjustments this admission:  Microbiology results:  Thank you for allowing pharmacy to be a part of this patient's care.  Hershal Coria 10/30/2016 5:15 PM

## 2016-10-31 DIAGNOSIS — C8330 Diffuse large B-cell lymphoma, unspecified site: Secondary | ICD-10-CM

## 2016-10-31 DIAGNOSIS — E038 Other specified hypothyroidism: Secondary | ICD-10-CM

## 2016-10-31 DIAGNOSIS — K5720 Diverticulitis of large intestine with perforation and abscess without bleeding: Principal | ICD-10-CM

## 2016-10-31 DIAGNOSIS — D899 Disorder involving the immune mechanism, unspecified: Secondary | ICD-10-CM

## 2016-10-31 LAB — BASIC METABOLIC PANEL
Anion gap: 7 (ref 5–15)
BUN: 49 mg/dL — AB (ref 6–20)
CALCIUM: 8.5 mg/dL — AB (ref 8.9–10.3)
CO2: 22 mmol/L (ref 22–32)
CREATININE: 1.74 mg/dL — AB (ref 0.61–1.24)
Chloride: 110 mmol/L (ref 101–111)
GFR calc Af Amer: 41 mL/min — ABNORMAL LOW (ref >60)
GFR, EST NON AFRICAN AMERICAN: 36 mL/min — AB (ref >60)
GLUCOSE: 115 mg/dL — AB (ref 65–99)
Potassium: 4.6 mmol/L (ref 3.5–5.1)
Sodium: 139 mmol/L (ref 135–145)

## 2016-10-31 LAB — CBC
HCT: 25.8 % — ABNORMAL LOW (ref 39.0–52.0)
Hemoglobin: 8.7 g/dL — ABNORMAL LOW (ref 13.0–17.0)
MCH: 33.2 pg (ref 26.0–34.0)
MCHC: 33.7 g/dL (ref 30.0–36.0)
MCV: 98.5 fL (ref 78.0–100.0)
PLATELETS: 151 10*3/uL (ref 150–400)
RBC: 2.62 MIL/uL — ABNORMAL LOW (ref 4.22–5.81)
RDW: 20 % — AB (ref 11.5–15.5)
WBC: 39 10*3/uL — ABNORMAL HIGH (ref 4.0–10.5)

## 2016-10-31 MED ORDER — KCL IN DEXTROSE-NACL 20-5-0.45 MEQ/L-%-% IV SOLN
INTRAVENOUS | Status: DC
  Administered 2016-10-31 – 2016-11-02 (×5): via INTRAVENOUS
  Filled 2016-10-31 (×7): qty 1000

## 2016-10-31 MED ORDER — SODIUM CHLORIDE 0.9% FLUSH
10.0000 mL | Freq: Two times a day (BID) | INTRAVENOUS | Status: DC
Start: 2016-10-31 — End: 2016-11-05
  Administered 2016-11-02: 10 mL

## 2016-10-31 MED ORDER — LEVOTHYROXINE SODIUM 100 MCG IV SOLR
50.0000 ug | Freq: Every day | INTRAVENOUS | Status: DC
  Administered 2016-10-31 – 2016-11-04 (×5): 50 ug via INTRAVENOUS
  Filled 2016-10-31 (×5): qty 5

## 2016-10-31 MED ORDER — SODIUM CHLORIDE 0.9% FLUSH: 10.0000 mL | INTRAVENOUS | Status: DC | PRN

## 2016-10-31 NOTE — Progress Notes (Signed)
Diverticulitis of colon with perforation  Subjective: Pt feeling better.  Pain improved but worse when oob.  Objective: Vital signs in last 24 hours: Temp:  [97.5 F (36.4 C)-98.1 F (36.7 C)] 98.1 F (36.7 C) (06/17 0516) Pulse Rate:  [39-80] 61 (06/17 0516) Resp:  [13-21] 18 (06/17 0516) BP: (104-133)/(57-78) 133/64 (06/17 0516) SpO2:  [92 %-99 %] 97 % (06/17 0516) Last BM Date: 10/30/16  Intake/Output from previous day: 06/16 0701 - 06/17 0700 In: 3465.8 [I.V.:2366; IV Piggyback:1099.8] Out: 1950 [Urine:1950] Intake/Output this shift: Total I/O In: -  Out: 200 [Urine:200]  General appearance: alert and cooperative GI: normal findings: soft, tender to palpation in LLQ   Lab Results:  Results for orders placed or performed during the hospital encounter of 10/30/16 (from the past 24 hour(s))  Lipase, blood     Status: None   Collection Time: 10/30/16 10:43 AM  Result Value Ref Range   Lipase 37 11 - 51 U/L  Comprehensive metabolic panel     Status: Abnormal   Collection Time: 10/30/16 10:43 AM  Result Value Ref Range   Sodium 137 135 - 145 mmol/L   Potassium 4.1 3.5 - 5.1 mmol/L   Chloride 108 101 - 111 mmol/L   CO2 22 22 - 32 mmol/L   Glucose, Bld 176 (H) 65 - 99 mg/dL   BUN 54 (H) 6 - 20 mg/dL   Creatinine, Ser 2.00 (H) 0.61 - 1.24 mg/dL   Calcium 8.6 (L) 8.9 - 10.3 mg/dL   Total Protein 5.4 (L) 6.5 - 8.1 g/dL   Albumin 3.1 (L) 3.5 - 5.0 g/dL   AST 27 15 - 41 U/L   ALT 32 17 - 63 U/L   Alkaline Phosphatase 59 38 - 126 U/L   Total Bilirubin 0.6 0.3 - 1.2 mg/dL   GFR calc non Af Amer 30 (L) >60 mL/min   GFR calc Af Amer 35 (L) >60 mL/min   Anion gap 7 5 - 15  CBC     Status: Abnormal   Collection Time: 10/30/16 10:43 AM  Result Value Ref Range   WBC 35.7 (H) 4.0 - 10.5 K/uL   RBC 2.72 (L) 4.22 - 5.81 MIL/uL   Hemoglobin 8.9 (L) 13.0 - 17.0 g/dL   HCT 27.1 (L) 39.0 - 52.0 %   MCV 99.6 78.0 - 100.0 fL   MCH 32.7 26.0 - 34.0 pg   MCHC 32.8 30.0 - 36.0 g/dL    RDW 19.9 (H) 11.5 - 15.5 %   Platelets 168 150 - 400 K/uL  Urinalysis, Routine w reflex microscopic     Status: None   Collection Time: 10/30/16  1:02 PM  Result Value Ref Range   Color, Urine YELLOW YELLOW   APPearance CLEAR CLEAR   Specific Gravity, Urine 1.010 1.005 - 1.030   pH 6.0 5.0 - 8.0   Glucose, UA NEGATIVE NEGATIVE mg/dL   Hgb urine dipstick NEGATIVE NEGATIVE   Bilirubin Urine NEGATIVE NEGATIVE   Ketones, ur NEGATIVE NEGATIVE mg/dL   Protein, ur NEGATIVE NEGATIVE mg/dL   Nitrite NEGATIVE NEGATIVE   Leukocytes, UA NEGATIVE NEGATIVE  Urinalysis, Routine w reflex microscopic     Status: None   Collection Time: 10/30/16  5:10 PM  Result Value Ref Range   Color, Urine YELLOW YELLOW   APPearance CLEAR CLEAR   Specific Gravity, Urine 1.015 1.005 - 1.030   pH 5.5 5.0 - 8.0   Glucose, UA NEGATIVE NEGATIVE mg/dL   Hgb urine dipstick NEGATIVE  NEGATIVE   Bilirubin Urine NEGATIVE NEGATIVE   Ketones, ur NEGATIVE NEGATIVE mg/dL   Protein, ur NEGATIVE NEGATIVE mg/dL   Nitrite NEGATIVE NEGATIVE   Leukocytes, UA NEGATIVE NEGATIVE  CBC     Status: Abnormal   Collection Time: 10/31/16  8:42 AM  Result Value Ref Range   WBC 39.0 (H) 4.0 - 10.5 K/uL   RBC 2.62 (L) 4.22 - 5.81 MIL/uL   Hemoglobin 8.7 (L) 13.0 - 17.0 g/dL   HCT 25.8 (L) 39.0 - 52.0 %   MCV 98.5 78.0 - 100.0 fL   MCH 33.2 26.0 - 34.0 pg   MCHC 33.7 30.0 - 36.0 g/dL   RDW 20.0 (H) 11.5 - 15.5 %   Platelets 151 150 - 400 K/uL     Studies/Results Radiology     MEDS, Scheduled . dexamethasone  4 mg Intravenous Q12H  . metoprolol tartrate  5 mg Intravenous Q8H  . pantoprazole (PROTONIX) IV  40 mg Intravenous Q24H  . sodium chloride flush  10-40 mL Intracatheter Q12H     Assessment: Diverticulitis of colon with perforation Immunocompromised   Plan: Per Dr Osker Mason wbc should cont to increase for a few more days.  Clinically, he appears in no distress and has no signs of deterioration.  Will cont NPO  and medical management   LOS: 1 day    Rosario Adie, MD [facility], [place]   10/31/2016 9:18 AM

## 2016-10-31 NOTE — Consult Note (Signed)
Reason for Referral: Lymphoma.   HPI: 81 year old gentleman diagnosed with diffuse large B-cell lymphoma diagnosed in February 2018. The patient had presented with abdominal pain and found to have a mass in the left upper quadrant of the abdomen involving small bowel. He underwent resection on February 26 by Dr. Donne Hazel and the pathology revealed diffuse large cell lymphoma. Initial staging workup including a PET/CT scan showed no involvement of the right axilla porta hepatis and retroperitoneum as well as peritoneal involvement and metabolic lesions in the bone.  He has been under the care of Dr. Irene Limbo and received the first cycle of CEOP with rituximab that was complicated by neutropenic fever and bacteremia. He completed 4 cycles of therapy and his last PET/CT scan on 10/22/2016 showed significant improvement of his overall disease.  He presented to the emergency department on 10/30/2016 with abdominal pain and his CT scan showed acute perforated diverticulitis of the descending colon without any abscesses. He was evaluated by Dr. Alphonsa Overall from general surgery and currently being treated with intravenous antibiotics with Zosyn in the hope to avoid surgery. I was asked to comment about his overall clinical status from the lymphoma standpoint.  Clinically, he reports feeling better since he is hospitalized. He is nothing by mouth we will reports his abdominal pain has nearly subsided. He has been receiving pain medication however. He hasn't had any headaches, blurry vision or seizures. He denied any chest pain, palpitation orthopnea. He does not report any cough or wheezing or hemoptysis. He does not report any nausea or vomiting. He denied any hematochezia or melena. He does not report any skeletal complaints. Remaining review of systems unremarkable.   Past Medical History:  Diagnosis Date  . ALLERGIC RHINITIS   . Anemia    hx  . Barrett's esophagus   . BOOP (bronchiolitis obliterans with  organizing pneumonia) ([place])    a. s/p R VATS 2008.  [name] CAD (coronary artery disease)    a. 05/2000: NSTEMI/CABG x 6: LIMA->LAD, VG->D1, VG->OM1->2, VG->PDA->RPL;  b. 07/2007 MV: high lat infarct, no ischemia, EF 47%;  c. Cath/PCI: LM nl, LAD20p, 38m, D1 nl, D2 60-70ost, D3 nl, LCX 70ost, 154m, OM1/OM2 min irregs, RCA 30 diff, PDA 99, LIMA->LAD atretic, VG->D1 100, VG->OM1->2 100, VG->PDA->RPL 90p (4.0x23 Vision BMS);  c. 07/2012 Echo: EF 55%, gr1 DD.  [name] Cataract   . CKD (chronic kidney disease), stage III    a. renal bx 2008: GLN with vasculitis  . Diffuse large B cell lymphoma ([place]) 08/07/2016  . GERD (gastroesophageal reflux disease)   . Glaucoma    a. Cannot see out of L eye.  . Hematuria    Microscopic  . Hyperglycemia    Patient reported while on prednisone, had to take insulin  . Hyperlipemia   . Hypertension   . Hypothyroidism   . ILD (interstitial lung disease) ([place])   . Iritis   . Local infection of skin and subcutaneous tissue   . Membranoproliferative nephritis   . Myocardial infarction ([place]) 2002  . Neutropenia, drug-induced ([place])   . Recurrent boils   . Residual foreign body in soft tissue   . Vasculitis ([place])    a.  pauciimmune vasculitis with renal involvement and hx of transient hemoptysis in the past with associated BOOP, 2008 (renal bx 2008: GLN with vasculitis). b. History of treatment with 2 cycles of Cytoxan and pheresis. H/o hemoptysis and pulm hemorrhage with 2nd cycle of cytoxan.  :  Past Surgical History:  Procedure Laterality  Date  . BOWEL RESECTION N/A 07/12/2016   Procedure: SMALL BOWEL RESECTION;  Surgeon: Rolm Bookbinder, MD;  Location: [facility];  Service: General;  Laterality: N/A;  . CATARACT EXTRACTION Bilateral   . CORONARY ARTERY BYPASS GRAFT  2002  . IR FLUORO GUIDE PORT INSERTION RIGHT  08/20/2016  . IR US GUIDE VASC ACCESS RIGHT  08/20/2016  . LAPAROSCOPY N/A 07/12/2016   Procedure: LAPAROSCOPY DIAGNOSTIC;  Surgeon: Rolm Bookbinder, MD;  Location: [facility];  Service: General;  Laterality: N/A;  . LEFT HEART CATHETERIZATION WITH CORONARY/GRAFT ANGIOGRAM N/A 07/26/2012   Procedure: LEFT HEART CATHETERIZATION WITH Beatrix Fetters;  Surgeon: Wellington Hampshire, MD;  Location: [facility] CATH LAB;  Service: Cardiovascular;  Laterality: N/A;  . LUNG BIOPSY    . PERCUTANEOUS CORONARY STENT INTERVENTION (PCI-S)  07/26/2012   Procedure: PERCUTANEOUS CORONARY STENT INTERVENTION (PCI-S);  Surgeon: Wellington Hampshire, MD;  Location: [facility] C[facility];  Service: Cardiovascular;;  . RENAL BIOPSY    . TOE AMPUTATION  2009   hammer toe  :   Current Facility-Administered Medications:  .  0.9 %  sodium chloride infusion, , Intravenous, Continuous, Alphonsa Overall, MD, Last Rate: 125 mL/hr at 10/30/16 2335 .  acetaminophen (TYLENOL) tablet 650 mg, 650 mg, Oral, Q6H PRN **OR** acetaminophen (TYLENOL) suppository 650 mg, 650 mg, Rectal, Q6H PRN, Bonnielee Haff, MD .  dexamethasone (DECADRON) injection 4 mg, 4 mg, Intravenous, Q12H, Bonnielee Haff, MD, 4 mg at 10/30/16 2156 .  metoprolol tartrate (LOPRESSOR) injection 5 mg, 5 mg, Intravenous, Q8H, Bonnielee Haff, MD, 5 mg at 10/31/16 0517 .  morphine 2 MG/ML injection 1-2 mg, 1-2 mg, Intravenous, Q3H PRN, Bonnielee Haff, MD, 2 mg at 10/30/16 2330 .  ondansetron (ZOFRAN) tablet 4 mg, 4 mg, Oral, Q6H PRN **OR** ondansetron (ZOFRAN) injection 4 mg, 4 mg, Intravenous, Q6H PRN, Bonnielee Haff, MD .  pantoprazole (PROTONIX) injection 40 mg, 40 mg, Intravenous, Q24H, Bonnielee Haff, MD, 40 mg at 10/30/16 1844 .  piperacillin-tazobactam (ZOSYN) IVPB 3.375 g, 3.375 g, Intravenous, Q8H, Dara Hoyer, RPH, Last Rate: 12.5 mL/hr at 10/31/16 0516, 3.375 g at 10/31/16 0516 .  sodium chloride flush (NS) 0.9 % injection 10-40 mL, 10-40 mL, Intracatheter, Q12H, Florencia Reasons, MD .  sodium chloride flush (NS) 0.9 % injection 10-40 mL, 10-40 mL, Intracatheter, PRN, Florencia Reasons, MD  Facility-Administered Medications Ordered in Other  Encounters:  .  sodium chloride flush (NS) 0.9 % injection 10 mL, 10 mL, Intracatheter, PRN, Brunetta Genera, MD, 10 mL at 08/24/16 1653 .  sodium chloride flush (NS) 0.9 % injection 10 mL, 10 mL, Intracatheter, PRN, Brunetta Genera, MD, 10 mL at 10/07/16 1555:  Allergies  Allergen Reactions  . No Known Allergies   :  Family History  Problem Relation Age of Onset  . Heart disease Mother   . Diabetes Mother   . Prostate cancer Father   . Depression Other   . Diabetes Other   . Prostate cancer Other   . Colon polyps Neg Hx   . Colon cancer Neg Hx   . Rectal cancer Neg Hx   . Stomach cancer Neg Hx   . Esophageal cancer Neg Hx   :  Social History   Social History  . Marital status: Married    Spouse name: N/A  . Number of children: N/A  . Years of education: N/A   Occupational History  . [place] History Main Topics  . Smoking status: Never Smoker  .  Smokeless tobacco: Never Used  . Alcohol use No  . Drug use: No  . Sexual activity: Not on file   Other Topics Concern  . Not on file   Social History Narrative   Regular exercise - yes      [facility] (07/12/16):   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  no pets currently. No bird or mold exposure.  :  Pertinent items are noted in HPI.  Exam: Blood pressure 133/64, pulse 61, temperature 98.1 F (36.7 C), temperature source Oral, resp. rate 18, height 6\' 1"  (1.854 m), SpO2 97 %. General appearance: alert and cooperative Throat: lips, mucosa, and tongue normal; teeth and gums normal Neck: no adenopathy Back: negative Resp: clear to auscultation bilaterally Chest wall: no tenderness Cardio: regular rate and rhythm, S1, S2 normal, no murmur, click, rub or gallop GI: soft, non-tender; bowel sounds normal; no masses,  no organomegaly Extremities: extremities normal, atraumatic, no cyanosis or edema   Recent Labs  10/30/16 1043  WBC 35.7*  HGB 8.9*  HCT 27.1*   PLT 168    Recent Labs  10/30/16 1043  NA 137  K 4.1  CL 108  CO2 22  GLUCOSE 176*  BUN 54*  CREATININE 2.00*  CALCIUM 8.6*    Ct Abdomen Pelvis Wo Contrast  Result Date: 10/30/2016 CLINICAL DATA:  Left lower quadrant pain. History of lymphoma with ongoing chemotherapy. EXAM: CT ABDOMEN AND PELVIS WITHOUT CONTRAST TECHNIQUE: Multidetector CT imaging of the abdomen and pelvis was performed following the standard protocol without IV contrast. COMPARISON:  06/15/2016 CT abdomen/ pelvis.  10/22/2016 head CT. FINDINGS: Lower chest: No significant pulmonary nodules or acute consolidative airspace disease. Intact lower sternotomy wires. Coronary atherosclerosis. Low mediastinal blood pool density, indicating anemia. Hepatobiliary: Normal liver size. Several subcentimeter hypodense lesions scattered throughout the liver are too small to characterize and not appreciably changed. No appreciable new liver lesions. Normal gallbladder with no radiopaque cholelithiasis. No biliary ductal dilatation. Pancreas: Normal, with no mass or duct dilation. Spleen: Normal size. No mass. Adrenals/Urinary Tract: Normal adrenals. No renal stones. No hydronephrosis. Small simple bilateral renal cysts, largest 2.4 cm in the posterior interpolar right kidney. Hypodense posterior upper right renal cortical lesion is too small to characterize and not appreciably changed, compatible with a benign renal cysts. No additional contour deforming renal lesions. Normal bladder. Stomach/Bowel: Small hiatal hernia. Otherwise collapsed and grossly normal stomach. Normal caliber small bowel with no small bowel wall thickening. Normal appendix. Moderate colonic diverticulosis, most prominent in the descending and sigmoid colon. Focal mild colonic wall thickening in the descending colon with associated mild pericolonic fat stranding, compatible with acute descending colon diverticulitis. There is associated pericolonic free air at the site  of diverticulitis. Vascular/Lymphatic: Atherosclerotic abdominal aorta with stable 3.3 cm infrarenal abdominal aortic aneurysm. No pathologically enlarged lymph nodes in the abdomen or pelvis. Reproductive: Top-normal size prostate. Other: Scattered pneumoperitoneum throughout the anterior and upper peritoneal cavity. No ascites. No focal fluid collections. Stable periumbilical subcutaneous scarring. Musculoskeletal: No aggressive appearing focal osseous lesions. Marked thoracolumbar spondylosis. IMPRESSION: 1. Acute perforated diverticulitis of the descending colon. Scattered free air throughout the peritoneal cavity. No focal drainable abscess . 2. Aortic atherosclerosis. Stable 3.3 cm infrarenal abdominal aortic aneurysm. Recommend followup by ultrasound in 3 years. This recommendation follows ACR consensus guidelines: White Paper of the ACR Incidental Findings Committee II on Vascular Findings. J Am Coll Radiol 2013; 10:789-794. 3. Additional chronic findings as above. Critical Value/emergent results  were called by telephone at the time of interpretation on 10/30/2016 at 2:14 pm to Dr. Isla Pence , who verbally acknowledged these results. Electronically Signed   By: Ilona Sorrel M.D.   On: 10/30/2016 14:16   Nm Pet Image Restag (ps) Skull Base To Thigh  Result Date: 10/22/2016 CLINICAL DATA:  Subsequent treatment strategy for diffuse large B-cell non-Hodgkin's lymphoma. EXAM: NUCLEAR MEDICINE PET SKULL BASE TO THIGH TECHNIQUE: 11.15 mCi F-18 FDG was injected intravenously. Full-ring PET imaging was performed from the skull base to thigh after the radiotracer. CT data was obtained and used for attenuation correction and anatomic localization. FASTING BLOOD GLUCOSE:  Value: 102 mg/dl COMPARISON:  None. FINDINGS: NECK No hypermetabolic lymph nodes in the neck. CHEST The FDG avid right axillary lymph nodes and epicardial lymph nodes have resolved in the interval. The focal hypermetabolic activity in the left  ventricular myocardium described on the previous study is unchanged. No new abnormalities in the chest. ABDOMEN/PELVIS Significant improvement in the abdomen. No definitive FDG avid disease remains in the liver or spleen. The adenopathy in the abdomen has resolved. Minimal uptake in the normal appearing left adrenal gland is of no significance. No abnormal uptake seen in the soft tissues of the pelvis. The omental nodularity seen previously has resolved. There is increased attenuation in the fat associated with the anterior ventral hernia with low level uptake, likely inflammatory. The maximum SUV is measured on image 161 is 3.7. SKELETON There has been significant overall improvement in the bones as well. Uptake in the anterior left acetabulum has significantly diminished with a maximum SUV of 3.4 today versus 29.4 previously. The left iliac uptake seen previously is no longer significantly FDG avid. There is mild focal uptake in the posterior left iliac bone on series 4, image 166 which was not seen previously and demonstrates a maximum SUV of 4.26 today. No other bony abnormalities are identified. IMPRESSION: 1. Significant interval improvement. No FDG avid disease remains in the soft tissues of the neck, chest, abdomen, or pelvis. Very mild uptake remains in the known left anterior acetabular lesion. Mild new uptake in the posterior left iliac bone is nonspecific but could represent mild involvement. Recommend attention on follow-up. 2. Increased attenuation and mild uptake in the fat of a lower midline ventral hernia is likely inflammatory. Recommend attention on follow-up. 3. No other interval change. Electronically Signed   By: Dorise Bullion III M.D   On: 10/22/2016 15:33    Assessment and Plan:   81 year old gentleman with the following issues:  1. Diffuse large cell lymphoma diagnosed in February 2018 with extensive involvement indicating stage IV disease. He received 4 cycles of CEOP with  rituximab and his PET scan on 10/22/2016 showed significant improvement. His last chemotherapy given on 10/26/2016. He received a Neulasta after that cycle of therapy.  2. Acute perforated diverticulitis noted on CT scan on 10/30/2016: No evidence of abscesses or sepsis noted at this time. His leukocytosis of 35,000 is likely related to his recent Neulasta. I do not anticipate his white cell nadir for another 3-4 days. His platelet count and hemoglobin appear adequate.  The approach to treat his perforated bowel was discussed with Dr. Lucia Gaskins today. The preferential approach for this to be treated conservatively with intravenous antibiotics and supportive care. If surgery is required, his counts appear adequate at this time to undergo the surgery without any need for any transfusion or growth factor support. Given his recent chemotherapy, he is healing process would  be compromised but if surgery is needed due to clinical deterioration, I see no absolute contraindication.  We will continue to follow with you and I will alert Dr. Irene Limbo to assist in his case as needed next week.

## 2016-10-31 NOTE — Progress Notes (Addendum)
PROGRESS NOTE  Luke Mcbride NAT:[id number] DOB: [birth year] DOA: 10/30/2016 PCP: Donato Heinz, MD  HPI/Recap of past 24 hours:  Report using prn med for abdominal pain , no fever, no n/v, passing gas, no bm Wife at bedside   Assessment/Plan: Principal Problem:   Diverticulitis of colon with perforation Active Problems:   Hypothyroidism   Essential hypertension, benign   INTERSTITIAL LUNG DISEASE   CKD (chronic kidney disease), stage III   Diffuse large B cell lymphoma ([place])   Acute diverticulitis   Acute diverticulitis with perforation:  Continue with Zosyn. Ivf, npo, prn analgesic, plavix held since admission in case needing surgery General surgery following, input appreciated  Leukocytosis:  He received neulasta on 6/14, also on steroids as part of chemo regimen. Infection also contribute  Repeat cbc in am  Coronary artery disease status post CABG and stent placement:  Stents, last placed in 2014. Safe to hold his Plavix for now.  He is followed by cardiology. Underwent stress test earlier this year which showed scar without any active ischemia. Does not need any further cardiac workup prior to undergoing any surgical intervention if needed.   Non-Hodgkin's lymphoma:  Received chemotherapy with R-CEOP on June 12, then Neulasta on 6/14.  He is on prednisone as part of R-CEOP he finished predsione for 5 days, he report does not take decadron regularly at home, vi steroids stopped.  Essential hypertension: Continue metoprolol intravenously. Home meds micardis held since admission  Chronic kidney disease, stage III: with h/o pauci immune GN, Followed by nephrology. Creatinine is close to his baseline. Monitor urine output.  UA unremarkable. Home meds lasix held since admission  Hypothyroidism:  Change to iv syntrhoid.  Hyperglycemia: Most likely due to steroids and stress. Not on meds for blood sugar at home, monitor, check a1c    Normocytic anemia:  Baseline hemoglobin appears to be around 10. Drop in hemoglobin could be due to chemotherapy. No overt bleeding has been reported. Continue to monitor closely. Transfuse as needed.   BPH: Continue home medication when he is able to take orally.  History of interstitial lung disease: Stable. Patient seen by pulmonology prior to his previous surgery. These are the recommendations for surgery: "1. Short duration of surgery as much as possible and avoid paralytic if possible 2. Recovery in step down or ICU with Pulmonary consultation 3. Aggressive pulmonary toilet with O2, bronchodilatation, and incentive spirometry and early ambulation"   DVT Prophylaxis: SCDs Code Status: Full code Family Communication: Discussed with the patient and his wife  Consults called:  General surgery  oncology   Procedures:  none  Antibiotics:  zosyn   Objective: BP 133/64 (BP Location: Left Arm)   Pulse 61   Temp 98.1 F (36.7 C) (Oral)   Resp 18   Ht 6\' 1"  (1.854 m)   SpO2 97%   Intake/Output Summary (Last 24 hours) at 10/31/16 0838 Last data filed at 10/31/16 4270  Gross per 24 hour  Intake           3465.8 ml  Output             2150 ml  Net           1315.8 ml   There were no vitals filed for this visit.  Exam:   General:  Pale, look weak, but NAD  Cardiovascular: RRR  Respiratory: CTABL  Abdomen:  left lower quadrant tender, no rebound, no  guarding, positive BS  Musculoskeletal: No Edema  Neuro:  aaox3  Data Reviewed: Basic Metabolic Panel:  Recent Labs Lab 10/26/16 1032 10/30/16 1043  NA 136 137  K 4.5 4.1  CL  --  108  CO2 23 22  GLUCOSE 135 176*  BUN 32.0* 54*  CREATININE 1.9* 2.00*  CALCIUM 9.1 8.6*   Liver Function Tests:  Recent Labs Lab 10/26/16 1032 10/30/16 1043  AST 15 27  ALT 18 32  ALKPHOS 83 59  BILITOT 0.40 0.6  PROT 6.5 5.4*  ALBUMIN 3.2* 3.1*    Recent Labs Lab 10/30/16 1043  LIPASE 37   No results for input(s): AMMONIA in  the last 168 hours. CBC:  Recent Labs Lab 10/26/16 1032 10/30/16 1043  WBC 10.1 35.7*  NEUTROABS 9.4*  --   HGB 10.1* 8.9*  HCT 31.7* 27.1*  MCV 101.0* 99.6  PLT 189 168   Cardiac Enzymes:   No results for input(s): CKTOTAL, CKMB, CKMBINDEX, TROPONINI in the last 168 hours. BNP (last 3 results)  Recent Labs  09/01/16 2300  BNP 288.4*    ProBNP (last 3 results) No results for input(s): PROBNP in the last 8760 hours.  CBG: No results for input(s): GLUCAP in the last 168 hours.  No results found for this or any previous visit (from the past 240 hour(s)).   Studies: Ct Abdomen Pelvis Wo Contrast  Result Date: 10/30/2016 CLINICAL DATA:  Left lower quadrant pain. History of lymphoma with ongoing chemotherapy. EXAM: CT ABDOMEN AND PELVIS WITHOUT CONTRAST TECHNIQUE: Multidetector CT imaging of the abdomen and pelvis was performed following the standard protocol without IV contrast. COMPARISON:  06/15/2016 CT abdomen/ pelvis.  10/22/2016 head CT. FINDINGS: Lower chest: No significant pulmonary nodules or acute consolidative airspace disease. Intact lower sternotomy wires. Coronary atherosclerosis. Low mediastinal blood pool density, indicating anemia. Hepatobiliary: Normal liver size. Several subcentimeter hypodense lesions scattered throughout the liver are too small to characterize and not appreciably changed. No appreciable new liver lesions. Normal gallbladder with no radiopaque cholelithiasis. No biliary ductal dilatation. Pancreas: Normal, with no mass or duct dilation. Spleen: Normal size. No mass. Adrenals/Urinary Tract: Normal adrenals. No renal stones. No hydronephrosis. Small simple bilateral renal cysts, largest 2.4 cm in the posterior interpolar right kidney. Hypodense posterior upper right renal cortical lesion is too small to characterize and not appreciably changed, compatible with a benign renal cysts. No additional contour deforming renal lesions. Normal bladder.  Stomach/Bowel: Small hiatal hernia. Otherwise collapsed and grossly normal stomach. Normal caliber small bowel with no small bowel wall thickening. Normal appendix. Moderate colonic diverticulosis, most prominent in the descending and sigmoid colon. Focal mild colonic wall thickening in the descending colon with associated mild pericolonic fat stranding, compatible with acute descending colon diverticulitis. There is associated pericolonic free air at the site of diverticulitis. Vascular/Lymphatic: Atherosclerotic abdominal aorta with stable 3.3 cm infrarenal abdominal aortic aneurysm. No pathologically enlarged lymph nodes in the abdomen or pelvis. Reproductive: Top-normal size prostate. Other: Scattered pneumoperitoneum throughout the anterior and upper peritoneal cavity. No ascites. No focal fluid collections. Stable periumbilical subcutaneous scarring. Musculoskeletal: No aggressive appearing focal osseous lesions. Marked thoracolumbar spondylosis. IMPRESSION: 1. Acute perforated diverticulitis of the descending colon. Scattered free air throughout the peritoneal cavity. No focal drainable abscess . 2. Aortic atherosclerosis. Stable 3.3 cm infrarenal abdominal aortic aneurysm. Recommend followup by ultrasound in 3 years. This recommendation follows ACR consensus guidelines: White Paper of the ACR Incidental Findings Committee II on Vascular Findings. J Am Coll Radiol 2013; 10:789-794. 3. Additional chronic findings as above. Critical Value/emergent  results were called by telephone at the time of interpretation on 10/30/2016 at 2:14 pm to Dr. Isla Pence , who verbally acknowledged these results. Electronically Signed   By: Ilona Sorrel M.D.   On: 10/30/2016 14:16    Scheduled Meds: . dexamethasone  4 mg Intravenous Q12H  . metoprolol tartrate  5 mg Intravenous Q8H  . pantoprazole (PROTONIX) IV  40 mg Intravenous Q24H  . sodium chloride flush  10-40 mL Intracatheter Q12H    Continuous Infusions: .  sodium chloride 125 mL/hr at 10/30/16 2335  . piperacillin-tazobactam (ZOSYN)  IV 3.375 g (10/31/16 0516)     Time spent: 43mins  Vernia Teem MD, PhD  [facility] Pager [phone]. If 7PM-7AM, please contact night-coverage at [email or web address], password [facility] 10/31/2016, 8:38 AM  LOS: 1 day

## 2016-11-01 DIAGNOSIS — N183 Chronic kidney disease, stage 3 (moderate): Secondary | ICD-10-CM

## 2016-11-01 DIAGNOSIS — C8338 Diffuse large B-cell lymphoma, lymph nodes of multiple sites: Secondary | ICD-10-CM

## 2016-11-01 DIAGNOSIS — R109 Unspecified abdominal pain: Secondary | ICD-10-CM

## 2016-11-01 LAB — CBC WITH DIFFERENTIAL/PLATELET
BASOS ABS: 0 10*3/uL (ref 0.0–0.1)
BASOS PCT: 0 %
Eosinophils Absolute: 0 10*3/uL (ref 0.0–0.7)
Eosinophils Relative: 0 %
HEMATOCRIT: 25.5 % — AB (ref 39.0–52.0)
HEMOGLOBIN: 8.5 g/dL — AB (ref 13.0–17.0)
LYMPHS PCT: 1 %
Lymphs Abs: 0.3 10*3/uL — ABNORMAL LOW (ref 0.7–4.0)
MCH: 32.4 pg (ref 26.0–34.0)
MCHC: 33.3 g/dL (ref 30.0–36.0)
MCV: 97.3 fL (ref 78.0–100.0)
MONOS PCT: 2 %
Monocytes Absolute: 0.6 10*3/uL (ref 0.1–1.0)
NEUTROS ABS: 26.7 10*3/uL — AB (ref 1.7–7.7)
NEUTROS PCT: 97 %
Platelets: 130 10*3/uL — ABNORMAL LOW (ref 150–400)
RBC: 2.62 MIL/uL — ABNORMAL LOW (ref 4.22–5.81)
RDW: 20 % — ABNORMAL HIGH (ref 11.5–15.5)
WBC: 27.6 10*3/uL — ABNORMAL HIGH (ref 4.0–10.5)

## 2016-11-01 LAB — BASIC METABOLIC PANEL
Anion gap: 7 (ref 5–15)
BUN: 47 mg/dL — ABNORMAL HIGH (ref 6–20)
CHLORIDE: 109 mmol/L (ref 101–111)
CO2: 23 mmol/L (ref 22–32)
CREATININE: 1.7 mg/dL — AB (ref 0.61–1.24)
Calcium: 8.5 mg/dL — ABNORMAL LOW (ref 8.9–10.3)
GFR calc non Af Amer: 37 mL/min — ABNORMAL LOW (ref >60)
GFR, EST AFRICAN AMERICAN: 42 mL/min — AB (ref >60)
Glucose, Bld: 116 mg/dL — ABNORMAL HIGH (ref 65–99)
POTASSIUM: 4.1 mmol/L (ref 3.5–5.1)
Sodium: 139 mmol/L (ref 135–145)

## 2016-11-01 NOTE — Progress Notes (Signed)
Initial Nutrition Assessment  DOCUMENTATION CODES:   Obesity unspecified  INTERVENTION:   Diet advancement per MD Will assess for nutrition supplement needs at follow-up  NUTRITION DIAGNOSIS:   Increased nutrient needs related to cancer and cancer related treatments as evidenced by estimated needs.  GOAL:   Patient will meet greater than or equal to 90% of their needs  MONITOR:   Diet advancement, Labs, Weight trends, I & O's  REASON FOR ASSESSMENT:   Malnutrition Screening Tool    ASSESSMENT:    81 y.o. male with a past medical history of chronic kidney disease stage III, history of coronary artery disease status post bypass and stent, last stents placed in 2014, history of non-Hodgkin's lymphoma undergoing chemotherapy with last chemotherapy given on June 12, who presents with complaints of abdominal pain that started last night. Located in the left lower quadrant. It has been up to 10 out of 10 in intensity. Sharp pain. No radiation. Patient denies any nausea, vomiting. No diarrhea. No blood in the stool. Denies any dysuria. No chest pain or shortness of breath. Symptoms continued to get worse, so he decided to come in to the emergency department.  Patient in room with no family at bedside. Pt sleeping soundly. Currently NPO, so RD allowed pt to rest. Per surgery note, pt will remain NPO for bowel rest today and possible advance to clears tomorrow. Pt received last chemo treatment for B-cell lymphoma on 6/12.   Pt's weight has increased recently per chart review. No muscle or fat depletion noted in assessment. Will assess for supplement needs at follow-up when pt is on a diet.  Medications: IV Protonix daily, D5 and .45% NaCl w/ KCl infusion at 75 ml/hr -provides 306 kcal Labs reviewed: GFR:37  Diet Order:  Diet NPO time specified Except for: Ice Chips  Skin:  Reviewed, no issues  Last BM:  6/16  Height:   Ht Readings from Last 1 Encounters:  10/31/16 6\' 1"  (1.854  m)    Weight:   Wt Readings from Last 1 Encounters:  10/31/16 227 lb 11.8 oz (103.3 kg)    Ideal Body Weight:  83.6 kg  BMI:  Body mass index is 30.05 kg/m.  Estimated Nutritional Needs:   Kcal:  2400-2600  Protein:  120-130g  Fluid:  2.5L/day  EDUCATION NEEDS:   No education needs identified at this time  Clayton Bibles, MS, RD, LDN Pager: [phone] After Hours Pager: [phone]

## 2016-11-01 NOTE — Progress Notes (Signed)
PROGRESS NOTE  Luke Mcbride DZH:[id number] DOB: 1936/05/04 DOA: 10/30/2016 PCP: Donato Heinz, MD  HPI/Recap of past 24 hours:  Feeling better, lase dose of prn pain meds was yesterday morning, no n/v, no fever, passing gas, no bm  Wife at bedside   Assessment/Plan: Principal Problem:   Diverticulitis of colon with perforation Active Problems:   Hypothyroidism   Essential hypertension, benign   INTERSTITIAL LUNG DISEASE   CKD (chronic kidney disease), stage III   Diffuse large B cell lymphoma (HCC)   Acute diverticulitis   Acute diverticulitis with perforation:  -Continue with Zosyn. Ivf, npo, prn analgesic, plavix held since admission in case needing surgery -diet advancement  per general surgery, -General surgery input appreciated   Leukocytosis:  He received neulasta on 6/14, also on steroids as part of chemo regimen. Infection also contribute  Repeat cbc in am  Coronary artery disease status post CABG and stent placement:  Stents, last placed in 2014. Safe to hold his Plavix for now.  He is followed by cardiology. Underwent stress test earlier this year which showed scar without any active ischemia. Does not need any further cardiac workup prior to undergoing any surgical intervention if needed.   Non-Hodgkin's lymphoma:  Received chemotherapy with R-CEOP on June 12, then Neulasta on 6/14.  He is on prednisone as part of R-CEOP he finished predsione for 5 days, he report does not take decadron regularly at home, iv steroids stopped.  Essential hypertension: Continue metoprolol intravenously. Home meds micardis held since admission  Chronic kidney disease, stage III: with h/o pauci immune GN, Followed by nephrology. Creatinine is close to his baseline. Monitor urine output.  UA unremarkable. Home meds lasix held since admission  Hypothyroidism:  Change to iv syntrhoid.  Hyperglycemia: Most likely due to steroids and stress. Not on meds for blood sugar  at home, monitor, a1c in process   Normocytic anemia: Baseline hemoglobin appears to be around 10. Drop in hemoglobin could be due to chemotherapy. No overt bleeding has been reported. Continue to monitor closely. Transfuse as needed.   BPH: Continue home medication when he is able to take orally.  History of interstitial lung disease: Stable. Patient seen by pulmonology prior to his previous surgery. These are the recommendations for surgery: "1. Short duration of surgery as much as possible and avoid paralytic if possible 2. Recovery in step down or ICU with Pulmonary consultation 3. Aggressive pulmonary toilet with O2, bronchodilatation, and incentive spirometry and early ambulation"   DVT Prophylaxis: SCDs, ambulating Code Status: Full code Family Communication: Discussed with the patient and his wife  Consults called:  General surgery  oncology   Procedures:  none  Antibiotics:  zosyn   Objective: BP 140/80 (BP Location: Left Arm)   Pulse 79   Temp 97.8 F (36.6 C) (Oral)   Resp 16   Ht 6\' 1"  (1.854 m)   Wt 103.3 kg (227 lb 11.8 oz)   SpO2 100%   BMI 30.05 kg/m   Intake/Output Summary (Last 24 hours) at 11/01/16 0824 Last data filed at 11/01/16 0558  Gross per 24 hour  Intake           2947.8 ml  Output             2150 ml  Net            797.8 ml   Filed Weights   10/31/16 1300  Weight: 103.3 kg (227 lb 11.8 oz)    Exam:  General:  Pale, look weak, but NAD  Cardiovascular: RRR  Respiratory: CTABL  Abdomen: less left lower quadrant tender, no rebound, no  guarding, positive BS  Musculoskeletal: No Edema  Neuro: aaox3  Data Reviewed: Basic Metabolic Panel:  Recent Labs Lab 10/26/16 1032 10/30/16 1043 10/31/16 0842 11/01/16 0701  NA 136 137 139 139  K 4.5 4.1 4.6 4.1  CL  --  108 110 109  CO2 23 22 22 23   GLUCOSE 135 176* 115* 116*  BUN 32.0* 54* 49* 47*  CREATININE 1.9* 2.00* 1.74* 1.70*  CALCIUM 9.1 8.6* 8.5* 8.5*    Liver Function Tests:  Recent Labs Lab 10/26/16 1032 10/30/16 1043  AST 15 27  ALT 18 32  ALKPHOS 83 59  BILITOT 0.40 0.6  PROT 6.5 5.4*  ALBUMIN 3.2* 3.1*    Recent Labs Lab 10/30/16 1043  LIPASE 37   No results for input(s): AMMONIA in the last 168 hours. CBC:  Recent Labs Lab 10/26/16 1032 10/30/16 1043 10/31/16 0842 11/01/16 0701  WBC 10.1 35.7* 39.0* 27.6*  NEUTROABS 9.4*  --   --  26.7*  HGB 10.1* 8.9* 8.7* 8.5*  HCT 31.7* 27.1* 25.8* 25.5*  MCV 101.0* 99.6 98.5 97.3  PLT 189 168 151 130*   Cardiac Enzymes:   No results for input(s): CKTOTAL, CKMB, CKMBINDEX, TROPONINI in the last 168 hours. BNP (last 3 results)  Recent Labs  09/01/16 2300  BNP 288.4*    ProBNP (last 3 results) No results for input(s): PROBNP in the last 8760 hours.  CBG: No results for input(s): GLUCAP in the last 168 hours.  No results found for this or any previous visit (from the past 240 hour(s)).   Studies: No results found.  Scheduled Meds: . levothyroxine  50 mcg Intravenous Daily  . metoprolol tartrate  5 mg Intravenous Q8H  . pantoprazole (PROTONIX) IV  40 mg Intravenous Q24H  . sodium chloride flush  10-40 mL Intracatheter Q12H    Continuous Infusions: . dextrose 5 % and 0.45 % NaCl with KCl 20 mEq/L 75 mL/hr at 10/31/16 2139  . piperacillin-tazobactam (ZOSYN)  IV 3.375 g (11/01/16 0550)     Time spent: 7mins  Kaislee Chao MD, PhD  [facility] Pager [phone]. If 7PM-7AM, please contact night-coverage at [email or web address], password [facility] 11/01/2016, 8:24 AM  LOS: 2 days

## 2016-11-01 NOTE — Progress Notes (Addendum)
[facility] Progress Note     Subjective: CC: LLQ pain Patient states pain is improved. Has been taking ice chips with no n/v and no worsening of abdominal pain. +flatus, no BM.  UOP good. VSS.   Objective: Vital signs in last 24 hours: Temp:  [97.8 F (36.6 C)-98 F (36.7 C)] 97.8 F (36.6 C) (06/18 0512) Pulse Rate:  [54-79] 79 (06/18 0512) Resp:  [16-18] 16 (06/18 0512) BP: (129-145)/(77-91) 140/80 (06/18 0512) SpO2:  [97 %-100 %] 100 % (06/18 0512) Weight:  [103.3 kg (227 lb 11.8 oz)] 103.3 kg (227 lb 11.8 oz) (06/17 1300) Last BM Date: 10/30/16  Intake/Output from previous day: 06/17 0701 - 06/18 0700 In: 2947.8 [I.V.:2748.4; IV Piggyback:199.4] Out: 2350 [Urine:2350] Intake/Output this shift: No intake/output data recorded.  PE: Gen:  Alert, NAD, pleasant Card:  Regular rate and rhythm, no M/G/R Pulm:  Normal effort, clear to auscultation bilaterally Abd: Soft, TTP in LLQ, non-distended, bowel sounds present in all 4 quadrants.  Skin: warm and dry, no rashes  Psych: A&Ox3   Lab Results:   Recent Labs  10/31/16 0842 11/01/16 0701  WBC 39.0* 27.6*  HGB 8.7* 8.5*  HCT 25.8* 25.5*  PLT 151 130*   BMET  Recent Labs  10/31/16 0842 11/01/16 0701  NA 139 139  K 4.6 4.1  CL 110 109  CO2 22 23  GLUCOSE 115* 116*  BUN 49* 47*  CREATININE 1.74* 1.70*  CALCIUM 8.5* 8.5*   CMP     Component Value Date/Time   NA 139 11/01/2016 0701   NA 136 10/26/2016 1032   K 4.1 11/01/2016 0701   K 4.5 10/26/2016 1032   CL 109 11/01/2016 0701   CO2 23 11/01/2016 0701   CO2 23 10/26/2016 1032   GLUCOSE 116 (H) 11/01/2016 0701   GLUCOSE 135 10/26/2016 1032   BUN 47 (H) 11/01/2016 0701   BUN 32.0 (H) 10/26/2016 1032   CREATININE 1.70 (H) 11/01/2016 0701   CREATININE 1.9 (H) 10/26/2016 1032   CALCIUM 8.5 (L) 11/01/2016 0701   CALCIUM 9.1 10/26/2016 1032   PROT 5.4 (L) 10/30/2016 1043   PROT 6.5 10/26/2016 1032   ALBUMIN 3.1 (L) 10/30/2016 1043    ALBUMIN 3.2 (L) 10/26/2016 1032   AST 27 10/30/2016 1043   AST 15 10/26/2016 1032   ALT 32 10/30/2016 1043   ALT 18 10/26/2016 1032   ALKPHOS 59 10/30/2016 1043   ALKPHOS 83 10/26/2016 1032   BILITOT 0.6 10/30/2016 1043   BILITOT 0.40 10/26/2016 1032   GFRNONAA 37 (L) 11/01/2016 0701   GFRAA 42 (L) 11/01/2016 0701   Lipase     Component Value Date/Time   LIPASE 37 10/30/2016 1043    Studies/Results: Ct Abdomen Pelvis Wo Contrast  Result Date: 10/30/2016 CLINICAL DATA:  Left lower quadrant pain. History of lymphoma with ongoing chemotherapy. EXAM: CT ABDOMEN AND PELVIS WITHOUT CONTRAST TECHNIQUE: Multidetector CT imaging of the abdomen and pelvis was performed following the standard protocol without IV contrast. COMPARISON:  06/15/2016 CT abdomen/ pelvis.  10/22/2016 head CT. FINDINGS: Lower chest: No significant pulmonary nodules or acute consolidative airspace disease. Intact lower sternotomy wires. Coronary atherosclerosis. Low mediastinal blood pool density, indicating anemia. Hepatobiliary: Normal liver size. Several subcentimeter hypodense lesions scattered throughout the liver are too small to characterize and not appreciably changed. No appreciable new liver lesions. Normal gallbladder with no radiopaque cholelithiasis. No biliary ductal dilatation. Pancreas: Normal, with no mass or duct dilation. Spleen: Normal size. No mass.  Adrenals/Urinary Tract: Normal adrenals. No renal stones. No hydronephrosis. Small simple bilateral renal cysts, largest 2.4 cm in the posterior interpolar right kidney. Hypodense posterior upper right renal cortical lesion is too small to characterize and not appreciably changed, compatible with a benign renal cysts. No additional contour deforming renal lesions. Normal bladder. Stomach/Bowel: Small hiatal hernia. Otherwise collapsed and grossly normal stomach. Normal caliber small bowel with no small bowel wall thickening. Normal appendix. Moderate colonic  diverticulosis, most prominent in the descending and sigmoid colon. Focal mild colonic wall thickening in the descending colon with associated mild pericolonic fat stranding, compatible with acute descending colon diverticulitis. There is associated pericolonic free air at the site of diverticulitis. Vascular/Lymphatic: Atherosclerotic abdominal aorta with stable 3.3 cm infrarenal abdominal aortic aneurysm. No pathologically enlarged lymph nodes in the abdomen or pelvis. Reproductive: Top-normal size prostate. Other: Scattered pneumoperitoneum throughout the anterior and upper peritoneal cavity. No ascites. No focal fluid collections. Stable periumbilical subcutaneous scarring. Musculoskeletal: No aggressive appearing focal osseous lesions. Marked thoracolumbar spondylosis. IMPRESSION: 1. Acute perforated diverticulitis of the descending colon. Scattered free air throughout the peritoneal cavity. No focal drainable abscess . 2. Aortic atherosclerosis. Stable 3.3 cm infrarenal abdominal aortic aneurysm. Recommend followup by ultrasound in 3 years. This recommendation follows ACR consensus guidelines: White Paper of the ACR Incidental Findings Committee II on Vascular Findings. J Am Coll Radiol 2013; 10:789-794. 3. Additional chronic findings as above. Critical Value/emergent results were called by telephone at the time of interpretation on 10/30/2016 at 2:14 pm to Dr. Isla Pence , who verbally acknowledged these results. Electronically Signed   By: Ilona Sorrel M.D.   On: 10/30/2016 14:16    Anti-infectives: Anti-infectives    Start     Dose/Rate Route Frequency Ordered Stop   10/30/16 2200  piperacillin-tazobactam (ZOSYN) IVPB 3.375 g     3.375 g 12.5 mL/hr over 240 Minutes Intravenous Every 8 hours 10/30/16 1719     10/30/16 1430  piperacillin-tazobactam (ZOSYN) IVPB 3.375 g     3.375 g 100 mL/hr over 30 Minutes Intravenous  Once 10/30/16 1423 10/30/16 1654       Assessment/Plan Diverticulitis  of colon with perforation - WBC 27.6 w/ mild left shift - Zosyn (6/16>>) - keep NPO with ice chips only - may be able to start clears tomorrow if continuing to improve clinically Immunocompromised  Large B Cell lymphoma -  Seen by Dr. Carolyne Fiscal - last chemotherapy ended 10/28/2016 - per Dr. Osker Mason, WBC may continue to increase for a few more days  CAD -  CABG x 6 - 05/2000, stent placed about 2015             Followed by Dr. Wilmon Pali - vasculitis (He was chronically on Imuran until the lymphoma diagnosis)             Creatinine - 2.0 - 10/30/2016 (has been running around 1.8)             Seen by Dr. Marval Regal Lung disease -  Seen by Dr. Lake Bells - 07/01/2016 for preop evaluation  FEN - NPO, ice chips, IVF. PRN antiemetics VTE - SCDs ID - IV Zosyn  Dispo: Continue NPO and bowel rest today, may be able to start clears tomorrow. AM labs.    LOS: 2 days    Brigid Re , [facility] 11/01/2016, 9:05 AM Pager: [phone] Consults: [phone] Mon-Fri 7:00 am-4:30 pm Sat-Sun 7:00 am-11:30 am  Agree with above.  Still focally tender in the  left lateral abdomen, but overall feels better. Probably start clear liquids in AM.  Alphonsa Overall, MD, [facility] Pager: [phone] Office phone:  [phone]

## 2016-11-01 NOTE — Progress Notes (Signed)
[name]   HEMATOLOGY/ONCOLOGY INPATIENT PROGRESS NOTE  Date of Service: 11/01/2016  Inpatient Attending: .Florencia Reasons, MD   SUBJECTIVE  [name] recently completed his fourth cycle of R-CEOP for DLBCL and his recent PET CT scan showed good response to treatment. He unfortunately got admitted over the weekend with a perforated diverticulitis presenting with left lower quadrant abdominal pain. He notes his abdominal pain is much better. He is being treated conservatively with IV fluids bowel rest and IV antibiotics. No fevers or chills. WBC count elevated likely from his Neulasta shot.  OBJECTIVE:  No acute distress  PHYSICAL EXAMINATION: . Vitals:   11/01/16 0512 11/01/16 1413 11/01/16 1523 11/01/16 2107  BP: 140/80 132/77 129/78 (!) 146/68  Pulse: 79 81 81 83  Resp: 16  16 16   Temp: 97.8 F (36.6 C)  97.8 F (36.6 C) 98.2 F (36.8 C)  TempSrc: Oral  Oral Oral  SpO2: 100%  100% 97%  Weight:      Height:       Filed Weights   10/31/16 1300  Weight: 227 lb 11.8 oz (103.3 kg)   .Body mass index is 30.05 kg/m.  GENERAL:alert, in no acute distress and comfortable SKIN No acute rashes EYES: normal, conjunctiva are pink and non-injected, sclera clear OROPHARYNX:no exudate, no erythema and lips, buccal mucosa, and tongue normal  NECK: supple, no JVD, thyroid normal size, non-tender, without nodularity LYMPH:  no palpable lymphadenopathy in the cervical, axillary or inguinal LUNGS: clear to auscultation with normal respiratory effort HEART: regular rate & rhythm,  no murmurs and no lower extremity edema ABDOMEN: abdomen soft, hypoactive bowel sounds, tenderness to palpation over the left lower quadrant. No rigidity or rebound . PSYCH: alert & oriented x 3 with fluent speech NEURO: no focal motor/sensory deficits  MEDICAL HISTORY:  Past Medical History:  Diagnosis Date  . ALLERGIC RHINITIS   . Anemia    hx  . Barrett's esophagus   . BOOP (bronchiolitis obliterans with  organizing pneumonia) ([place])    a. s/p R VATS 2008.  [name] CAD (coronary artery disease)    a. 05/2000: NSTEMI/CABG x 6: LIMA->LAD, VG->D1, VG->OM1->2, VG->PDA->RPL;  b. 07/2007 MV: high lat infarct, no ischemia, EF 47%;  c. Cath/PCI: LM nl, LAD20p, 23m, D1 nl, D2 60-70ost, D3 nl, LCX 70ost, 178m, OM1/OM2 min irregs, RCA 30 diff, PDA 99, LIMA->LAD atretic, VG->D1 100, VG->OM1->2 100, VG->PDA->RPL 90p (4.0x23 Vision BMS);  c. 07/2012 Echo: EF 55%, gr1 DD.  [name] Cataract   . CKD (chronic kidney disease), stage III    a. renal bx 2008: GLN with vasculitis  . Diffuse large B cell lymphoma ([place]) 08/07/2016  . GERD (gastroesophageal reflux disease)   . Glaucoma    a. Cannot see out of L eye.  . Hematuria    Microscopic  . Hyperglycemia    Patient reported while on prednisone, had to take insulin  . Hyperlipemia   . Hypertension   . Hypothyroidism   . ILD (interstitial lung disease) ([place])   . Iritis   . Local infection of skin and subcutaneous tissue   . Membranoproliferative nephritis   . Myocardial infarction ([place]) 2002  . Neutropenia, drug-induced ([place])   . Recurrent boils   . Residual foreign body in soft tissue   . Vasculitis ([place])    a.  pauciimmune vasculitis with renal involvement and hx of transient hemoptysis in the past with associated BOOP, 2008 (renal bx 2008: GLN with vasculitis). b. History of treatment with 2  cycles of Cytoxan and pheresis. H/o hemoptysis and pulm hemorrhage with 2nd cycle of cytoxan.    SURGICAL HISTORY: Past Surgical History:  Procedure Laterality Date  . BOWEL RESECTION N/A 07/12/2016   Procedure: SMALL BOWEL RESECTION;  Surgeon: Rolm Bookbinder, MD;  Location: [facility];  Service: General;  Laterality: N/A;  . CATARACT EXTRACTION Bilateral   . CORONARY ARTERY BYPASS GRAFT  2002  . IR FLUORO GUIDE PORT INSERTION RIGHT  08/20/2016  . IR US GUIDE VASC ACCESS RIGHT  08/20/2016  . LAPAROSCOPY N/A 07/12/2016   Procedure: LAPAROSCOPY DIAGNOSTIC;  Surgeon: Rolm Bookbinder,  MD;  Location: [facility];  Service: General;  Laterality: N/A;  . LEFT HEART CATHETERIZATION WITH CORONARY/GRAFT ANGIOGRAM N/A 07/26/2012   Procedure: LEFT HEART CATHETERIZATION WITH Beatrix Fetters;  Surgeon: Wellington Hampshire, MD;  Location: [facility] CATH LAB;  Service: Cardiovascular;  Laterality: N/A;  . LUNG BIOPSY    . PERCUTANEOUS CORONARY STENT INTERVENTION (PCI-S)  07/26/2012   Procedure: PERCUTANEOUS CORONARY STENT INTERVENTION (PCI-S);  Surgeon: Wellington Hampshire, MD;  Location: [facility] CATH LAB;  Service: Cardiovascular;;  . RENAL BIOPSY    . TOE AMPUTATION  2009   hammer toe    SOCIAL HISTORY: Social History   Social History  . Marital status: Married    Spouse name: N/A  . Number of children: N/A  . Years of education: N/A   Occupational History  . [place] History Main Topics  . Smoking status: Never Smoker  . Smokeless tobacco: Never Used  . Alcohol use No  . Drug use: No  . Sexual activity: Not on file   Other Topics Concern  . Not on file   Social History Narrative   Regular exercise - yes      [facility] (07/12/16):   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  no pets currently. No bird or mold exposure.    FAMILY HISTORY: Family History  Problem Relation Age of Onset  . Heart disease Mother   . Diabetes Mother   . Prostate cancer Father   . Depression Other   . Diabetes Other   . Prostate cancer Other   . Colon polyps Neg Hx   . Colon cancer Neg Hx   . Rectal cancer Neg Hx   . Stomach cancer Neg Hx   . Esophageal cancer Neg Hx     ALLERGIES:  is allergic to no known allergies.  MEDICATIONS:  Scheduled Meds: . levothyroxine  50 mcg Intravenous Daily  . metoprolol tartrate  5 mg Intravenous Q8H  . pantoprazole (PROTONIX) IV  40 mg Intravenous Q24H  . sodium chloride flush  10-40 mL Intracatheter Q12H   Continuous Infusions: . dextrose 5 % and 0.45 % NaCl with KCl 20 mEq/L 75 mL/hr at 11/01/16 1803  .  piperacillin-tazobactam (ZOSYN)  IV 3.375 g (11/01/16 2140)   PRN Meds:.acetaminophen **OR** acetaminophen, morphine injection, ondansetron **OR** ondansetron (ZOFRAN) IV, sodium chloride flush  REVIEW OF SYSTEMS:    10 Point review of Systems was done is negative except as noted above.   LABORATORY DATA:  I have reviewed the data as listed  . CBC Latest Ref Rng & Units 11/01/2016 10/31/2016 10/30/2016  WBC 4.0 - 10.5 K/uL 27.6(H) 39.0(H) 35.7(H)  Hemoglobin 13.0 - 17.0 g/dL 8.5(L) 8.7(L) 8.9(L)  Hematocrit 39.0 - 52.0 % 25.5(L) 25.8(L) 27.1(L)  Platelets 150 - 400 K/uL 130(L) 151 168    . CMP Latest Ref Rng &  Units 11/01/2016 10/31/2016 10/30/2016  Glucose 65 - 99 mg/dL 116(H) 115(H) 176(H)  BUN 6 - 20 mg/dL 47(H) 49(H) 54(H)  Creatinine 0.61 - 1.24 mg/dL 1.70(H) 1.74(H) 2.00(H)  Sodium 135 - 145 mmol/L 139 139 137  Potassium 3.5 - 5.1 mmol/L 4.1 4.6 4.1  Chloride 101 - 111 mmol/L 109 110 108  CO2 22 - 32 mmol/L 23 22 22   Calcium 8.9 - 10.3 mg/dL 8.5(L) 8.5(L) 8.6(L)  Total Protein 6.5 - 8.1 g/dL - - 5.4(L)  Total Bilirubin 0.3 - 1.2 mg/dL - - 0.6  Alkaline Phos 38 - 126 U/L - - 59  AST 15 - 41 U/L - - 27  ALT 17 - 63 U/L - - 32     RADIOGRAPHIC STUDIES: I have personally reviewed the radiological images as listed and agreed with the findings in the report. Ct Abdomen Pelvis Wo Contrast  Result Date: 10/30/2016 CLINICAL DATA:  Left lower quadrant pain. History of lymphoma with ongoing chemotherapy. EXAM: CT ABDOMEN AND PELVIS WITHOUT CONTRAST TECHNIQUE: Multidetector CT imaging of the abdomen and pelvis was performed following the standard protocol without IV contrast. COMPARISON:  06/15/2016 CT abdomen/ pelvis.  10/22/2016 head CT. FINDINGS: Lower chest: No significant pulmonary nodules or acute consolidative airspace disease. Intact lower sternotomy wires. Coronary atherosclerosis. Low mediastinal blood pool density, indicating anemia. Hepatobiliary: Normal liver size. Several  subcentimeter hypodense lesions scattered throughout the liver are too small to characterize and not appreciably changed. No appreciable new liver lesions. Normal gallbladder with no radiopaque cholelithiasis. No biliary ductal dilatation. Pancreas: Normal, with no mass or duct dilation. Spleen: Normal size. No mass. Adrenals/Urinary Tract: Normal adrenals. No renal stones. No hydronephrosis. Small simple bilateral renal cysts, largest 2.4 cm in the posterior interpolar right kidney. Hypodense posterior upper right renal cortical lesion is too small to characterize and not appreciably changed, compatible with a benign renal cysts. No additional contour deforming renal lesions. Normal bladder. Stomach/Bowel: Small hiatal hernia. Otherwise collapsed and grossly normal stomach. Normal caliber small bowel with no small bowel wall thickening. Normal appendix. Moderate colonic diverticulosis, most prominent in the descending and sigmoid colon. Focal mild colonic wall thickening in the descending colon with associated mild pericolonic fat stranding, compatible with acute descending colon diverticulitis. There is associated pericolonic free air at the site of diverticulitis. Vascular/Lymphatic: Atherosclerotic abdominal aorta with stable 3.3 cm infrarenal abdominal aortic aneurysm. No pathologically enlarged lymph nodes in the abdomen or pelvis. Reproductive: Top-normal size prostate. Other: Scattered pneumoperitoneum throughout the anterior and upper peritoneal cavity. No ascites. No focal fluid collections. Stable periumbilical subcutaneous scarring. Musculoskeletal: No aggressive appearing focal osseous lesions. Marked thoracolumbar spondylosis. IMPRESSION: 1. Acute perforated diverticulitis of the descending colon. Scattered free air throughout the peritoneal cavity. No focal drainable abscess . 2. Aortic atherosclerosis. Stable 3.3 cm infrarenal abdominal aortic aneurysm. Recommend followup by ultrasound in 3 years.  This recommendation follows ACR consensus guidelines: White Paper of the ACR Incidental Findings Committee II on Vascular Findings. J Am Coll Radiol 2013; 10:789-794. 3. Additional chronic findings as above. Critical Value/emergent results were called by telephone at the time of interpretation on 10/30/2016 at 2:14 pm to Dr. Isla Pence , who verbally acknowledged these results. Electronically Signed   By: Ilona Sorrel M.D.   On: 10/30/2016 14:16   Nm Pet Image Restag (ps) Skull Base To Thigh  Result Date: 10/22/2016 CLINICAL DATA:  Subsequent treatment strategy for diffuse large B-cell non-Hodgkin's lymphoma. EXAM: NUCLEAR MEDICINE PET SKULL BASE TO THIGH TECHNIQUE: 11.15  mCi F-18 FDG was injected intravenously. Full-ring PET imaging was performed from the skull base to thigh after the radiotracer. CT data was obtained and used for attenuation correction and anatomic localization. FASTING BLOOD GLUCOSE:  Value: 102 mg/dl COMPARISON:  None. FINDINGS: NECK No hypermetabolic lymph nodes in the neck. CHEST The FDG avid right axillary lymph nodes and epicardial lymph nodes have resolved in the interval. The focal hypermetabolic activity in the left ventricular myocardium described on the previous study is unchanged. No new abnormalities in the chest. ABDOMEN/PELVIS Significant improvement in the abdomen. No definitive FDG avid disease remains in the liver or spleen. The adenopathy in the abdomen has resolved. Minimal uptake in the normal appearing left adrenal gland is of no significance. No abnormal uptake seen in the soft tissues of the pelvis. The omental nodularity seen previously has resolved. There is increased attenuation in the fat associated with the anterior ventral hernia with low level uptake, likely inflammatory. The maximum SUV is measured on image 161 is 3.7. SKELETON There has been significant overall improvement in the bones as well. Uptake in the anterior left acetabulum has significantly  diminished with a maximum SUV of 3.4 today versus 29.4 previously. The left iliac uptake seen previously is no longer significantly FDG avid. There is mild focal uptake in the posterior left iliac bone on series 4, image 166 which was not seen previously and demonstrates a maximum SUV of 4.26 today. No other bony abnormalities are identified. IMPRESSION: 1. Significant interval improvement. No FDG avid disease remains in the soft tissues of the neck, chest, abdomen, or pelvis. Very mild uptake remains in the known left anterior acetabular lesion. Mild new uptake in the posterior left iliac bone is nonspecific but could represent mild involvement. Recommend attention on follow-up. 2. Increased attenuation and mild uptake in the fat of a lower midline ventral hernia is likely inflammatory. Recommend attention on follow-up. 3. No other interval change. Electronically Signed   By: Dorise Bullion III M.D   On: 10/22/2016 15:33    ASSESSMENT & PLAN:   81 yo with   81 yo male with multiple medical co-morbidities including cardiac co-morbidities, IPF, vascular on chronic immunosupression with   1)Stage IV Diffuse large B-cell lymphoma involving the small intestine (germinal cell phenotype) with complete thickness involvement of bowel and abutting serosa.  Initial PET/CT scan showed extensive involvement with DLBCL, hypermetabolic nodal involvement of the right axilla, pericardial space, porta hepatis/mesenteric root, and retroperitoneum, there is extensive involvement of the liver as well as peritoneal involvement along the liver surface, splenic surface, and upper omentum. 2. Hypermetabolic bony lesions in the left pelvis compatible with malignant involvement. 3. There is a focus of hypermetabolic activity along the anterolateral wall of the left ventricle.   s/p 4 cycles of R-CEOP .  PET/CT after 3 cycles showed excellent metabolic response to treatment.  - Status post neutropenic fever and  E.coli/pseudomonal bacteremia/HCAP after the first cycle of treatment. Result Significant chemotherapy related thrombocytopenia - now resolved.  2) chronic kidney disease stage III baseline creatinine about 2 3) coronary disease status post PCI and CABG - had a myocardial perfusion imaging scan on 06/30/2016 that showed a moderately decreased ejection fraction of 30-44%- this precludes anthracycline use currently. 4) history of pulmonary and renal vasculitis on chronic Imuran. Now off Imuran considering chemotherapy and Rituxan use.  5) Admitted with Acute perforated Diverticulitis - being managed conservatively. Plan - continue conservative mx of acute perforated diverticulitis with bowel rest, IV fluids,  electrolye management, pain medications when necessary. - surgery team following along -diet advanced as per surgery team recommendations. -he is scheduled for C5 of R-CEOP from 11/16/2016 but this might need to be delayed depending on how her feels. -he has received Neulasta after C4 and expect his leucocytosis is primarily related to this. -transfuse PRBC prn for hgb<8 or if symptomatic  RTC in with Dr Irene Limbo in as per apppointment in clinic on 11/11/2016 with labs  I spent 30 minutes counseling the patient face to face. The total time spent in the appointment was 35 minutes and more than 50% was on counseling and direct patient cares.    Sullivan Lone MD [place] AAHIVMS [facility] Hematology/Oncology Physician [facility]  (Office):       [phone] (Work cell):  [phone] (Fax):           [phone]  11/01/2016 10:28 PM

## 2016-11-02 LAB — BASIC METABOLIC PANEL
Anion gap: 6 (ref 5–15)
BUN: 40 mg/dL — AB (ref 6–20)
CALCIUM: 8.3 mg/dL — AB (ref 8.9–10.3)
CO2: 24 mmol/L (ref 22–32)
CREATININE: 1.64 mg/dL — AB (ref 0.61–1.24)
Chloride: 108 mmol/L (ref 101–111)
GFR calc Af Amer: 44 mL/min — ABNORMAL LOW (ref >60)
GFR, EST NON AFRICAN AMERICAN: 38 mL/min — AB (ref >60)
GLUCOSE: 115 mg/dL — AB (ref 65–99)
POTASSIUM: 4.3 mmol/L (ref 3.5–5.1)
SODIUM: 138 mmol/L (ref 135–145)

## 2016-11-02 LAB — CBC
HCT: 28.2 % — ABNORMAL LOW (ref 39.0–52.0)
Hemoglobin: 9.1 g/dL — ABNORMAL LOW (ref 13.0–17.0)
MCH: 31.9 pg (ref 26.0–34.0)
MCHC: 32.3 g/dL (ref 30.0–36.0)
MCV: 98.9 fL (ref 78.0–100.0)
PLATELETS: 116 10*3/uL — AB (ref 150–400)
RBC: 2.85 MIL/uL — ABNORMAL LOW (ref 4.22–5.81)
RDW: 20 % — AB (ref 11.5–15.5)
WBC: 17.3 10*3/uL — ABNORMAL HIGH (ref 4.0–10.5)

## 2016-11-02 LAB — HEMOGLOBIN A1C
Hgb A1c MFr Bld: 6.1 % — ABNORMAL HIGH (ref 4.8–5.6)
MEAN PLASMA GLUCOSE: 128 mg/dL

## 2016-11-02 NOTE — Progress Notes (Signed)
PROGRESS NOTE  Luke GENTHER IWL:[id number] DOB: 07-14-1935 DOA: 10/30/2016 PCP: Donato Heinz, MD  HPI/Recap of past 24 hours:  less left lower quadrant pain, he has not required prn morphine last 24hrs, last dose iv morphine was on 6/16,   no n/v, no fever, passing gas, no bm, he is ambulating Wife at bedside   Assessment/Plan: Principal Problem:   Diverticulitis of colon with perforation Active Problems:   Hypothyroidism   Essential hypertension, benign   INTERSTITIAL LUNG DISEASE   CKD (chronic kidney disease), stage III   Diffuse large B cell lymphoma ([facility])   Acute diverticulitis   Acute diverticulitis with perforation:  -Continue with Zosyn. Ivf, npo, prn analgesic, plavix held since admission in case needing surgery, resume plavix when ok with general surgery -diet advancement  per general surgery, started on clears on 6/19 -General surgery input appreciated   Leukocytosis:  -He received neulasta on 6/14, also on steroids as part of chemo regimen. infection also contribute  --Monitor, wbc possible will drop in the next few days  Due to chemo effect   Coronary artery disease status post CABG and stent placement:  --Stents last placed in 2014. Safe to hold his Plavix for now.  --He is followed by cardiology. Underwent stress test earlier this year which showed scar without any active ischemia. Does not need any further cardiac workup prior to undergoing any surgical intervention if needed.  --Most recent LVEF 30-44% per stress test done in 06/2016, home meds lasix held since admission, he received ivf since admission, will d/c ivf on 6/19, continue hold lasix, he is euvolemic on 6/19, no edema, on room air.  Non-Hodgkin's lymphoma:  --Received chemotherapy with R-CEOP on June 12, then Neulasta on 6/14.  --He is on prednisone as part of R-CEOP he finished predsione for 5 days, he report does not take decadron regularly at home, iv steroids stopped. --Oncology  consulted, input appreciated.  Essential hypertension: bp stable  Continue metoprolol intravenously. Home meds micardis held since admission  Chronic kidney disease, stage III: with h/o pauci immune GN, Followed by nephrology. Creatinine is close to his baseline. Monitor urine output.  UA unremarkable. Home meds lasix held since admission  Hypothyroidism:  Change to iv syntrhoid. Restart oral meds when able to take po consistently   Hyperglycemia: Most likely due to steroids and stress. Not on meds for blood sugar at home, monitor, a1c 6.1   Normocytic anemia: Baseline hemoglobin appears to be around 10. Drop in hemoglobin could be due to chemotherapy. No overt bleeding has been reported. Continue to monitor closely. Transfuse as needed. Per oncology transfuse to keep hgb>8   BPH: Continue home medication when he is able to take orally.  History of interstitial lung disease: Stable. Patient seen by pulmonology prior to his previous surgery. These are the recommendations for surgery: "1. Short duration of surgery as much as possible and avoid paralytic if possible 2. Recovery in step down or ICU with Pulmonary consultation 3. Aggressive pulmonary toilet with O2, bronchodilatation, and incentive spirometry and early ambulation"   DVT Prophylaxis: SCDs, ambulating Code Status: Full code Family Communication: Discussed with the patient and his wife  Consults called:  General surgery  oncology   Procedures:  none  Antibiotics:  zosyn   Objective: BP 133/70 (BP Location: Right Arm)   Pulse 80   Temp 98.2 F (36.8 C) (Oral)   Resp 18   Ht 6\' 1"  (1.854 m)   Wt 103.3 kg (227 lb  11.8 oz)   SpO2 97%   BMI 30.05 kg/m   Intake/Output Summary (Last 24 hours) at 11/02/16 0818 Last data filed at 11/02/16 0701  Gross per 24 hour  Intake             2005 ml  Output             2350 ml  Net             -345 ml   Filed Weights   10/31/16 1300  Weight: 103.3 kg (227  lb 11.8 oz)    Exam:   General:  Pale, look weak, but NAD  Cardiovascular: RRR  Respiratory: CTABL  Abdomen: less left lower quadrant tender, no rebound, no  guarding, positive BS  Musculoskeletal: No Edema  Neuro: aaox3  Data Reviewed: Basic Metabolic Panel:  Recent Labs Lab 10/26/16 1032 10/30/16 1043 10/31/16 0842 11/01/16 0701 11/02/16 0544  NA 136 137 139 139 138  K 4.5 4.1 4.6 4.1 4.3  CL  --  108 110 109 108  CO2 23 22 22 23 24   GLUCOSE 135 176* 115* 116* 115*  BUN 32.0* 54* 49* 47* 40*  CREATININE 1.9* 2.00* 1.74* 1.70* 1.64*  CALCIUM 9.1 8.6* 8.5* 8.5* 8.3*   Liver Function Tests:  Recent Labs Lab 10/26/16 1032 10/30/16 1043  AST 15 27  ALT 18 32  ALKPHOS 83 59  BILITOT 0.40 0.6  PROT 6.5 5.4*  ALBUMIN 3.2* 3.1*    Recent Labs Lab 10/30/16 1043  LIPASE 37   No results for input(s): AMMONIA in the last 168 hours. CBC:  Recent Labs Lab 10/26/16 1032 10/30/16 1043 10/31/16 0842 11/01/16 0701 11/02/16 0544  WBC 10.1 35.7* 39.0* 27.6* 17.3*  NEUTROABS 9.4*  --   --  26.7*  --   HGB 10.1* 8.9* 8.7* 8.5* 9.1*  HCT 31.7* 27.1* 25.8* 25.5* 28.2*  MCV 101.0* 99.6 98.5 97.3 98.9  PLT 189 168 151 130* 116*   Cardiac Enzymes:   No results for input(s): CKTOTAL, CKMB, CKMBINDEX, TROPONINI in the last 168 hours. BNP (last 3 results)  Recent Labs  09/01/16 2300  BNP 288.4*    ProBNP (last 3 results) No results for input(s): PROBNP in the last 8760 hours.  CBG: No results for input(s): GLUCAP in the last 168 hours.  No results found for this or any previous visit (from the past 240 hour(s)).   Studies: No results found.  Scheduled Meds: . levothyroxine  50 mcg Intravenous Daily  . metoprolol tartrate  5 mg Intravenous Q8H  . pantoprazole (PROTONIX) IV  40 mg Intravenous Q24H  . sodium chloride flush  10-40 mL Intracatheter Q12H    Continuous Infusions: . dextrose 5 % and 0.45 % NaCl with KCl 20 mEq/L 75 mL/hr at 11/02/16  0752  . piperacillin-tazobactam (ZOSYN)  IV 3.375 g (11/02/16 0505)     Time spent: 47mins  Shantil Vallejo MD, PhD  [facility] Pager [phone]. If 7PM-7AM, please contact night-coverage at [email or web address], password [email or web address] 11/02/2016, 8:18 AM  LOS: 3 days

## 2016-11-02 NOTE — Progress Notes (Addendum)
General Surgery  CC:  LLQ pain  Subjective: Pain on admit in the LLQ, a little better now, but still has some tenderness.   Reports he has been up walking.  Objective: Vital signs in last 24 hours: Temp:  [97.8 F (36.6 C)-98.2 F (36.8 C)] 98.2 F (36.8 C) (06/19 0448) Pulse Rate:  [80-83] 80 (06/19 0448) Resp:  [16-18] 18 (06/19 0448) BP: (129-146)/(68-78) 133/70 (06/19 0448) SpO2:  [97 %-100 %] 97 % (06/19 0448) Last BM Date: 10/30/16 NPO 2000 IV 2050 urine Afebrile, VSS WBC improving slowly,  Creatinine is slowly improving CT abd/pelvis 6/16:  Acute perforated diverticulitis of the descending colon. Scattered free air throughout the peritoneal cavity. No focal drainable abscess . Intake/Output from previous day: 06/18 0701 - 06/19 0700 In: 2005 [I.V.:1805; IV Piggyback:200] Out: 2050 [Urine:2050] Intake/Output this shift: Total I/O In: -  Out: 300 [Urine:300]  General appearance: alert, cooperative and no distress Resp: clear to auscultation bilaterally GI: soft, still a little tender, but better, few BS, and flatus, no BM  Lab Results:   Recent Labs  11/01/16 0701 11/02/16 0544  WBC 27.6* 17.3*  HGB 8.5* 9.1*  HCT 25.5* 28.2*  PLT 130* 116*    BMET  Recent Labs  11/01/16 0701 11/02/16 0544  NA 139 138  K 4.1 4.3  CL 109 108  CO2 23 24  GLUCOSE 116* 115*  BUN 47* 40*  CREATININE 1.70* 1.64*  CALCIUM 8.5* 8.3*   PT/INR No results for input(s): LABPROT, INR in the last 72 hours.   Recent Labs Lab 10/26/16 1032 10/30/16 1043  AST 15 27  ALT 18 32  ALKPHOS 83 59  BILITOT 0.40 0.6  PROT 6.5 5.4*  ALBUMIN 3.2* 3.1*     Lipase     Component Value Date/Time   LIPASE 37 10/30/2016 1043     Medications: . levothyroxine  50 mcg Intravenous Daily  . metoprolol tartrate  5 mg Intravenous Q8H  . pantoprazole (PROTONIX) IV  40 mg Intravenous Q24H  . sodium chloride flush  10-40 mL Intracatheter Q12H   . dextrose 5 % and 0.45 % NaCl  with KCl 20 mEq/L 75 mL/hr at 11/02/16 0752  . piperacillin-tazobactam (ZOSYN)  IV 3.375 g (11/02/16 0505)   Anti-infectives    Start     Dose/Rate Route Frequency Ordered Stop   10/30/16 2200  piperacillin-tazobactam (ZOSYN) IVPB 3.375 g     3.375 g 12.5 mL/hr over 240 Minutes Intravenous Every 8 hours 10/30/16 1719     10/30/16 1430  piperacillin-tazobactam (ZOSYN) IVPB 3.375 g     3.375 g 100 mL/hr over 30 Minutes Intravenous  Once 10/30/16 1423 10/30/16 1654      Assessment/Plan Diverticulitis with perforation  Large B cell lymphoma - chemotherapy 10/28/16 CAD - CABG x 6 05/2000,  Stent 2015 [facility] - vasculitis (He was chronically on Imuran until the lymphoma diagnosis) Creatinine - 2.0 - 10/30/2016 (has been running around 1.8) Seen by Dr. Marval Regal Lung disease - Seen by Dr. Lake Bells - 07/01/2016 for preop evaluation FEN - IV fluidsNPO, ice chips,  VTE - SCDs ID - IV Zosyn 6/16 =>> day 4   Plan:  Clear liquids today, continue antibiotics, mobilize, and he can have Heparin or Lovenox for DVT prophylaxis from our standpoint.  Will defer to Dr. Erlinda Hong.     LOS: 3 days    Earnstine Regal 11/02/2016 [phone]  Agree with above. Clearly less tender to me.  Agree with starting clear liquids.  Wife in room.  I answered questions about diverticulitis and his hospital course.  His case is a little more complicated because of the lymphoma. Dr. Irene Limbo saw him yesterday and is on board.  Alphonsa Overall, MD, [facility] Surgery Pager: [phone] Office phone:  [phone]

## 2016-11-03 DIAGNOSIS — Z7969 Long term (current) use of other immunomodulators and immunosuppressants: Secondary | ICD-10-CM

## 2016-11-03 DIAGNOSIS — Z79899 Other long term (current) drug therapy: Secondary | ICD-10-CM

## 2016-11-03 LAB — BASIC METABOLIC PANEL
Anion gap: 8 (ref 5–15)
BUN: 30 mg/dL — AB (ref 6–20)
CHLORIDE: 106 mmol/L (ref 101–111)
CO2: 24 mmol/L (ref 22–32)
CREATININE: 1.63 mg/dL — AB (ref 0.61–1.24)
Calcium: 8.5 mg/dL — ABNORMAL LOW (ref 8.9–10.3)
GFR calc Af Amer: 45 mL/min — ABNORMAL LOW (ref >60)
GFR calc non Af Amer: 38 mL/min — ABNORMAL LOW (ref >60)
Glucose, Bld: 107 mg/dL — ABNORMAL HIGH (ref 65–99)
Potassium: 4.3 mmol/L (ref 3.5–5.1)
Sodium: 138 mmol/L (ref 135–145)

## 2016-11-03 LAB — CBC
HCT: 29 % — ABNORMAL LOW (ref 39.0–52.0)
HEMOGLOBIN: 9.5 g/dL — AB (ref 13.0–17.0)
MCH: 31.8 pg (ref 26.0–34.0)
MCHC: 32.8 g/dL (ref 30.0–36.0)
MCV: 97 fL (ref 78.0–100.0)
Platelets: 103 10*3/uL — ABNORMAL LOW (ref 150–400)
RBC: 2.99 MIL/uL — ABNORMAL LOW (ref 4.22–5.81)
RDW: 19.5 % — ABNORMAL HIGH (ref 11.5–15.5)
WBC: 6 10*3/uL (ref 4.0–10.5)

## 2016-11-03 MED ORDER — ASPIRIN EC 81 MG PO TBEC
81.0000 mg | DELAYED_RELEASE_TABLET | Freq: Every day | ORAL | Status: DC
  Administered 2016-11-03 – 2016-11-05 (×3): 81 mg via ORAL
  Filled 2016-11-03 (×3): qty 1

## 2016-11-03 MED ORDER — TAMSULOSIN HCL 0.4 MG PO CAPS
0.4000 mg | ORAL_CAPSULE | Freq: Every day | ORAL | Status: DC
  Administered 2016-11-03 – 2016-11-04 (×2): 0.4 mg via ORAL
  Filled 2016-11-03 (×2): qty 1

## 2016-11-03 MED ORDER — FINASTERIDE 5 MG PO TABS
5.0000 mg | ORAL_TABLET | Freq: Every day | ORAL | Status: DC
  Administered 2016-11-03 – 2016-11-04 (×2): 5 mg via ORAL
  Filled 2016-11-03 (×2): qty 1

## 2016-11-03 MED ORDER — METOPROLOL TARTRATE 12.5 MG HALF TABLET
12.5000 mg | ORAL_TABLET | Freq: Two times a day (BID) | ORAL | Status: DC
  Administered 2016-11-03 – 2016-11-05 (×5): 12.5 mg via ORAL
  Filled 2016-11-03 (×5): qty 1

## 2016-11-03 MED ORDER — PANTOPRAZOLE SODIUM 40 MG PO TBEC
40.0000 mg | DELAYED_RELEASE_TABLET | Freq: Every day | ORAL | Status: DC
Start: 2016-11-03 — End: 2016-11-05
  Administered 2016-11-03 – 2016-11-05 (×3): 40 mg via ORAL
  Filled 2016-11-03 (×3): qty 1

## 2016-11-03 MED ORDER — ASPIRIN 81 MG PO TBEC: 81.0000 mg | DELAYED_RELEASE_TABLET | Freq: Every day | ORAL | Status: DC

## 2016-11-03 MED ORDER — CLOPIDOGREL BISULFATE 75 MG PO TABS
75.0000 mg | ORAL_TABLET | Freq: Every day | ORAL | Status: DC
  Administered 2016-11-03 – 2016-11-05 (×3): 75 mg via ORAL
  Filled 2016-11-03 (×3): qty 1

## 2016-11-03 NOTE — Progress Notes (Signed)
Luke Mcbride   HEMATOLOGY/ONCOLOGY INPATIENT PROGRESS NOTE  Date of Service: 11/02/2016  Inpatient Attending: .Florencia Reasons, MD   SUBJECTIVE  Luke Mcbride is in better spirits today. He notes his abdominal pain has significantly improved and he has not needed as pain medications. No fevers/chills. Started on clear liquids as per surgery with close monitoring. He is D7 post 4th cycle of R-CEOP and count are trending down. Expect counts of nadir between day 7 and D12. Hopefully the neulasta keeps him from becoming neutropenic since that can complicate his perforated diverticulitis.  OBJECTIVE:  No acute distress  PHYSICAL EXAMINATION: . Vitals:   11/01/16 2107 11/02/16 0448 11/02/16 1400 11/02/16 2010  BP: (!) 146/68 133/70 (!) 156/84 (!) 146/72  Pulse: 83 80 76 84  Resp: 16 18 17 16   Temp: 98.2 F (36.8 C) 98.2 F (36.8 C) 98.2 F (36.8 C) 98 F (36.7 C)  TempSrc: Oral Oral Oral Oral  SpO2: 97% 97% 98% 99%  Weight:      Height:       Filed Weights   10/31/16 1300  Weight: 227 lb 11.8 oz (103.3 kg)   .Body mass index is 30.05 kg/m.  GENERAL:alert, in no acute distress and comfortable SKIN No acute rashes EYES: normal, conjunctiva are pink and non-injected, sclera clear OROPHARYNX:no exudate, no erythema and lips, buccal mucosa, and tongue normal  NECK: supple, no JVD, thyroid normal size, non-tender, without nodularity LYMPH:  no palpable lymphadenopathy in the cervical, axillary or inguinal LUNGS: clear to auscultation with normal respiratory effort HEART: regular rate & rhythm,  no murmurs and no lower extremity edema ABDOMEN: abdomen soft, hypoactive bowel sounds, tenderness to palpation over the left lower quadrant. No rigidity or rebound . PSYCH: alert & oriented x 3 with fluent speech NEURO: no focal motor/sensory deficits  MEDICAL HISTORY:  Past Medical History:  Diagnosis Date  . ALLERGIC RHINITIS   . Anemia    hx  . Barrett's esophagus   . BOOP (bronchiolitis  obliterans with organizing pneumonia) ([place])    a. s/p R VATS 2008.  Luke Mcbride CAD (coronary artery disease)    a. 05/2000: NSTEMI/CABG x 6: LIMA->LAD, VG->D1, VG->OM1->2, VG->PDA->RPL;  b. 07/2007 MV: high lat infarct, no ischemia, EF 47%;  c. Cath/PCI: LM nl, LAD20p, 55m, D1 nl, D2 60-70ost, D3 nl, LCX 70ost, 117m, OM1/OM2 min irregs, RCA 30 diff, PDA 99, LIMA->LAD atretic, VG->D1 100, VG->OM1->2 100, VG->PDA->RPL 90p (4.0x23 Vision BMS);  c. 07/2012 Echo: EF 55%, gr1 DD.  Luke Mcbride Cataract   . CKD (chronic kidney disease), stage III    a. renal bx 2008: GLN with vasculitis  . Diffuse large B cell lymphoma ([place]) 08/07/2016  . GERD (gastroesophageal reflux disease)   . Glaucoma    a. Cannot see out of L eye.  . Hematuria    Microscopic  . Hyperglycemia    Patient reported while on prednisone, had to take insulin  . Hyperlipemia   . Hypertension   . Hypothyroidism   . ILD (interstitial lung disease) ([place])   . Iritis   . Local infection of skin and subcutaneous tissue   . Membranoproliferative nephritis   . Myocardial infarction ([place]) 2002  . Neutropenia, drug-induced ([place])   . Recurrent boils   . Residual foreign body in soft tissue   . Vasculitis ([place])    a.  pauciimmune vasculitis with renal involvement and hx of transient hemoptysis in the past with associated BOOP, 2008 (renal bx 2008: GLN with vasculitis). b. History of  treatment with 2 cycles of Cytoxan and pheresis. H/o hemoptysis and pulm hemorrhage with 2nd cycle of cytoxan.    SURGICAL HISTORY: Past Surgical History:  Procedure Laterality Date  . BOWEL RESECTION N/A 07/12/2016   Procedure: SMALL BOWEL RESECTION;  Surgeon: Rolm Bookbinder, MD;  Location: [place];  Service: General;  Laterality: N/A;  . CATARACT EXTRACTION Bilateral   . CORONARY ARTERY BYPASS GRAFT  2002  . IR FLUORO GUIDE PORT INSERTION RIGHT  08/20/2016  . IR US GUIDE VASC ACCESS RIGHT  08/20/2016  . LAPAROSCOPY N/A 07/12/2016   Procedure: LAPAROSCOPY DIAGNOSTIC;  Surgeon:  Rolm Bookbinder, MD;  Location: [facility];  Service: General;  Laterality: N/A;  . LEFT HEART CATHETERIZATION WITH CORONARY/GRAFT ANGIOGRAM N/A 07/26/2012   Procedure: LEFT HEART CATHETERIZATION WITH Beatrix Fetters;  Surgeon: Wellington Hampshire, MD;  Location: [facility] CATH LAB;  Service: Cardiovascular;  Laterality: N/A;  . LUNG BIOPSY    . PERCUTANEOUS CORONARY STENT INTERVENTION (PCI-S)  07/26/2012   Procedure: PERCUTANEOUS CORONARY STENT INTERVENTION (PCI-S);  Surgeon: Wellington Hampshire, MD;  Location: [facility] CATH LAB;  Service: Cardiovascular;;  . RENAL BIOPSY    . TOE AMPUTATION  2009   hammer toe    SOCIAL HISTORY: Social History   Social History  . Marital status: Married    Spouse name: N/A  . Number of children: N/A  . Years of education: N/A   Occupational History  . [place] History Main Topics  . Smoking status: Never Smoker  . Smokeless tobacco: Never Used  . Alcohol use No  . Drug use: No  . Sexual activity: Not on file   Other Topics Concern  . Not on file   Social History Narrative   Regular exercise - yes      [place] [facility] (07/12/16):   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  no pets currently. No bird or mold exposure.    FAMILY HISTORY: Family History  Problem Relation Age of Onset  . Heart disease Mother   . Diabetes Mother   . Prostate cancer Father   . Depression Other   . Diabetes Other   . Prostate cancer Other   . Colon polyps Neg Hx   . Colon cancer Neg Hx   . Rectal cancer Neg Hx   . Stomach cancer Neg Hx   . Esophageal cancer Neg Hx     ALLERGIES:  is allergic to no known allergies.  MEDICATIONS:  Scheduled Meds: . levothyroxine  50 mcg Intravenous Daily  . metoprolol tartrate  5 mg Intravenous Q8H  . pantoprazole (PROTONIX) IV  40 mg Intravenous Q24H  . sodium chloride flush  10-40 mL Intracatheter Q12H   Continuous Infusions: . piperacillin-tazobactam (ZOSYN)  IV 3.375 g (11/02/16 2123)    PRN Meds:.acetaminophen **OR** acetaminophen, morphine injection, ondansetron **OR** ondansetron (ZOFRAN) IV, sodium chloride flush  REVIEW OF SYSTEMS:    10 Point review of Systems was done is negative except as noted above.   LABORATORY DATA:  I have reviewed the data as listed  . CBC Latest Ref Rng & Units 11/02/2016 11/01/2016 10/31/2016  WBC 4.0 - 10.5 K/uL 17.3(H) 27.6(H) 39.0(H)  Hemoglobin 13.0 - 17.0 g/dL 9.1(L) 8.5(L) 8.7(L)  Hematocrit 39.0 - 52.0 % 28.2(L) 25.5(L) 25.8(L)  Platelets 150 - 400 K/uL 116(L) 130(L) 151   . CBC    Component Value Date/Time   WBC 17.3 (H) 11/02/2016 0544   RBC 2.85 (L) 11/02/2016 0544  HGB 9.1 (L) 11/02/2016 0544   HGB 10.1 (L) 10/26/2016 1032   HCT 28.2 (L) 11/02/2016 0544   HCT 31.7 (L) 10/26/2016 1032   PLT 116 (L) 11/02/2016 0544   PLT 189 10/26/2016 1032   MCV 98.9 11/02/2016 0544   MCV 101.0 (H) 10/26/2016 1032   MCH 31.9 11/02/2016 0544   MCHC 32.3 11/02/2016 0544   RDW 20.0 (H) 11/02/2016 0544   RDW 20.0 (H) 10/26/2016 1032   LYMPHSABS 0.3 (L) 11/01/2016 0701   LYMPHSABS 0.5 (L) 10/26/2016 1032   MONOABS 0.6 11/01/2016 0701   MONOABS 0.2 10/26/2016 1032   EOSABS 0.0 11/01/2016 0701   EOSABS 0.0 10/26/2016 1032   BASOSABS 0.0 11/01/2016 0701   BASOSABS 0.0 10/26/2016 1032     . CMP Latest Ref Rng & Units 11/02/2016 11/01/2016 10/31/2016  Glucose 65 - 99 mg/dL 115(H) 116(H) 115(H)  BUN 6 - 20 mg/dL 40(H) 47(H) 49(H)  Creatinine 0.61 - 1.24 mg/dL 1.64(H) 1.70(H) 1.74(H)  Sodium 135 - 145 mmol/L 138 139 139  Potassium 3.5 - 5.1 mmol/L 4.3 4.1 4.6  Chloride 101 - 111 mmol/L 108 109 110  CO2 22 - 32 mmol/L 24 23 22   Calcium 8.9 - 10.3 mg/dL 8.3(L) 8.5(L) 8.5(L)  Total Protein 6.5 - 8.1 g/dL - - -  Total Bilirubin 0.3 - 1.2 mg/dL - - -  Alkaline Phos 38 - 126 U/L - - -  AST 15 - 41 U/L - - -  ALT 17 - 63 U/L - - -     RADIOGRAPHIC STUDIES: I have personally reviewed the radiological images as listed and agreed  with the findings in the report. Ct Abdomen Pelvis Wo Contrast  Result Date: 10/30/2016 CLINICAL DATA:  Left lower quadrant pain. History of lymphoma with ongoing chemotherapy. EXAM: CT ABDOMEN AND PELVIS WITHOUT CONTRAST TECHNIQUE: Multidetector CT imaging of the abdomen and pelvis was performed following the standard protocol without IV contrast. COMPARISON:  06/15/2016 CT abdomen/ pelvis.  10/22/2016 head CT. FINDINGS: Lower chest: No significant pulmonary nodules or acute consolidative airspace disease. Intact lower sternotomy wires. Coronary atherosclerosis. Low mediastinal blood pool density, indicating anemia. Hepatobiliary: Normal liver size. Several subcentimeter hypodense lesions scattered throughout the liver are too small to characterize and not appreciably changed. No appreciable new liver lesions. Normal gallbladder with no radiopaque cholelithiasis. No biliary ductal dilatation. Pancreas: Normal, with no mass or duct dilation. Spleen: Normal size. No mass. Adrenals/Urinary Tract: Normal adrenals. No renal stones. No hydronephrosis. Small simple bilateral renal cysts, largest 2.4 cm in the posterior interpolar right kidney. Hypodense posterior upper right renal cortical lesion is too small to characterize and not appreciably changed, compatible with a benign renal cysts. No additional contour deforming renal lesions. Normal bladder. Stomach/Bowel: Small hiatal hernia. Otherwise collapsed and grossly normal stomach. Normal caliber small bowel with no small bowel wall thickening. Normal appendix. Moderate colonic diverticulosis, most prominent in the descending and sigmoid colon. Focal mild colonic wall thickening in the descending colon with associated mild pericolonic fat stranding, compatible with acute descending colon diverticulitis. There is associated pericolonic free air at the site of diverticulitis. Vascular/Lymphatic: Atherosclerotic abdominal aorta with stable 3.3 cm infrarenal abdominal  aortic aneurysm. No pathologically enlarged lymph nodes in the abdomen or pelvis. Reproductive: Top-normal size prostate. Other: Scattered pneumoperitoneum throughout the anterior and upper peritoneal cavity. No ascites. No focal fluid collections. Stable periumbilical subcutaneous scarring. Musculoskeletal: No aggressive appearing focal osseous lesions. Marked thoracolumbar spondylosis. IMPRESSION: 1. Acute perforated diverticulitis of the descending  colon. Scattered free air throughout the peritoneal cavity. No focal drainable abscess . 2. Aortic atherosclerosis. Stable 3.3 cm infrarenal abdominal aortic aneurysm. Recommend followup by ultrasound in 3 years. This recommendation follows ACR consensus guidelines: White Paper of the ACR Incidental Findings Committee II on Vascular Findings. J Am Coll Radiol 2013; 10:789-794. 3. Additional chronic findings as above. Critical Value/emergent results were called by telephone at the time of interpretation on 10/30/2016 at 2:14 pm to Dr. Isla Pence , who verbally acknowledged these results. Electronically Signed   By: Ilona Sorrel M.D.   On: 10/30/2016 14:16   Nm Pet Image Restag (ps) Skull Base To Thigh  Result Date: 10/22/2016 CLINICAL DATA:  Subsequent treatment strategy for diffuse large B-cell non-Hodgkin's lymphoma. EXAM: NUCLEAR MEDICINE PET SKULL BASE TO THIGH TECHNIQUE: 11.15 mCi F-18 FDG was injected intravenously. Full-ring PET imaging was performed from the skull base to thigh after the radiotracer. CT data was obtained and used for attenuation correction and anatomic localization. FASTING BLOOD GLUCOSE:  Value: 102 mg/dl COMPARISON:  None. FINDINGS: NECK No hypermetabolic lymph nodes in the neck. CHEST The FDG avid right axillary lymph nodes and epicardial lymph nodes have resolved in the interval. The focal hypermetabolic activity in the left ventricular myocardium described on the previous study is unchanged. No new abnormalities in the chest.  ABDOMEN/PELVIS Significant improvement in the abdomen. No definitive FDG avid disease remains in the liver or spleen. The adenopathy in the abdomen has resolved. Minimal uptake in the normal appearing left adrenal gland is of no significance. No abnormal uptake seen in the soft tissues of the pelvis. The omental nodularity seen previously has resolved. There is increased attenuation in the fat associated with the anterior ventral hernia with low level uptake, likely inflammatory. The maximum SUV is measured on image 161 is 3.7. SKELETON There has been significant overall improvement in the bones as well. Uptake in the anterior left acetabulum has significantly diminished with a maximum SUV of 3.4 today versus 29.4 previously. The left iliac uptake seen previously is no longer significantly FDG avid. There is mild focal uptake in the posterior left iliac bone on series 4, image 166 which was not seen previously and demonstrates a maximum SUV of 4.26 today. No other bony abnormalities are identified. IMPRESSION: 1. Significant interval improvement. No FDG avid disease remains in the soft tissues of the neck, chest, abdomen, or pelvis. Very mild uptake remains in the known left anterior acetabular lesion. Mild new uptake in the posterior left iliac bone is nonspecific but could represent mild involvement. Recommend attention on follow-up. 2. Increased attenuation and mild uptake in the fat of a lower midline ventral hernia is likely inflammatory. Recommend attention on follow-up. 3. No other interval change. Electronically Signed   By: Dorise Bullion III M.D   On: 10/22/2016 15:33    ASSESSMENT & PLAN:   81 yo with   80 yo male with multiple medical co-morbidities including cardiac co-morbidities, IPF, vascular on chronic immunosupression with   1)Stage IV Diffuse large B-cell lymphoma involving the small intestine (germinal cell phenotype) with complete thickness involvement of bowel and abutting  serosa.  Initial PET/CT scan showed extensive involvement with DLBCL, hypermetabolic nodal involvement of the right axilla, pericardial space, porta hepatis/mesenteric root, and retroperitoneum, there is extensive involvement of the liver as well as peritoneal involvement along the liver surface, splenic surface, and upper omentum. 2. Hypermetabolic bony lesions in the left pelvis compatible with malignant involvement. 3. There is a focus  of hypermetabolic activity along the anterolateral wall of the left ventricle.   s/p 4 cycles of R-CEOP .  PET/CT after 3 cycles showed excellent metabolic response to treatment.  - Status post neutropenic fever and E.coli/pseudomonal bacteremia/HCAP after the first cycle of treatment. Result Significant chemotherapy related thrombocytopenia - now resolved.  2) chronic kidney disease stage III baseline creatinine about 2 3) coronary disease status post PCI and CABG - had a myocardial perfusion imaging scan on 06/30/2016 that showed a moderately decreased ejection fraction of 30-44%- this precludes anthracycline use currently. 4) history of pulmonary and renal vasculitis on chronic Imuran. Now off Imuran considering chemotherapy and Rituxan use.  5) Admitted with Acute perforated Diverticulitis - being managed conservatively. Plan - continue conservative mx of acute perforated diverticulitis with bowel rest, IV fluids, electrolye management, pain medications when necessary. -he is clinically much improved and has been started on clear liquid as per surgery. -Mild anemia, thrombocytopenia. Expect his counts will nadir between D7 and D12 post treatment on 10/26/2016. -monitor daily cbc with diff - surgery team following along -diet advanced as per surgery team recommendations. -he is scheduled for C5 of R-CEOP from 11/16/2016 but this might need to be delayed depending on how he feels, status of his perf diverticulitis and counts -he has received Neulasta after  C4 and expect his leucocytosis is primarily related to this. -transfuse PRBC prn for hgb<8 or if symptomatic  RTC in with Dr Irene Limbo in as per apppointment in clinic on 11/11/2016 with labs  I spent 20 minutes counseling the patient face to face. The total time spent in the appointment was 25 minutes and more than 50% was on counseling and direct patient cares.    Sullivan Lone MD [place] AAHIVMS [facility] Hematology/Oncology Physician [facility]  (Office):       [phone] (Work cell):  [phone] (Fax):           [phone]

## 2016-11-03 NOTE — Progress Notes (Signed)
[name]   HEMATOLOGY/ONCOLOGY INPATIENT PROGRESS NOTE  Date of Service: 11/03/2016  Inpatient Attending: .Baird Lyons*   SUBJECTIVE  Mr. Wolford is feeling better today. No abd pain. Diet advanced to full liquids. Has 2-3 loose stools today without discomfort . Recommended not to strain too much. He is D8 post 4th cycle of R-CEOP and count conitnue to gradually trending down. Expect counts of nadir between day 7 and D12. Hopefully the neulasta keeps him from becoming neutropenic since that can complicate his perforated diverticulitis. Primary eam plans to rpt CT abd /pelvis tomorrow.  OBJECTIVE:  No acute distress  PHYSICAL EXAMINATION: . Vitals:   11/02/16 1400 11/02/16 2010 11/03/16 0452 11/03/16 1400  BP: (!) 156/84 (!) 146/72 133/87 114/66  Pulse: 76 84 67 84  Resp: 17 16 18 19   Temp: 98.2 F (36.8 C) 98 F (36.7 C) 97.8 F (36.6 C) 98.3 F (36.8 C)  TempSrc: Oral Oral Oral Oral  SpO2: 98% 99% 98% 98%  Weight:      Height:       Filed Weights   10/31/16 1300  Weight: 227 lb 11.8 oz (103.3 kg)   .Body mass index is 30.05 kg/m.  GENERAL:alert, in no acute distress and comfortable SKIN No acute rashes EYES: normal, conjunctiva are pink and non-injected, sclera clear OROPHARYNX:no exudate, no erythema and lips, buccal mucosa, and tongue normal  NECK: supple, no JVD, thyroid normal size, non-tender, without nodularity LYMPH:  no palpable lymphadenopathy in the cervical, axillary or inguinal LUNGS: clear to auscultation with normal respiratory effort HEART: regular rate & rhythm,  no murmurs and no lower extremity edema ABDOMEN: abdomen soft, hypoactive bowel sounds, tenderness to palpation over the left lower quadrant. No rigidity or rebound . PSYCH: alert & oriented x 3 with fluent speech NEURO: no focal motor/sensory deficits  MEDICAL HISTORY:  Past Medical History:  Diagnosis Date  . ALLERGIC RHINITIS   . Anemia    hx  . Barrett's esophagus   .  BOOP (bronchiolitis obliterans with organizing pneumonia) ([place])    a. s/p R VATS 2008.  [name] CAD (coronary artery disease)    a. 05/2000: NSTEMI/CABG x 6: LIMA->LAD, VG->D1, VG->OM1->2, VG->PDA->RPL;  b. 07/2007 MV: high lat infarct, no ischemia, EF 47%;  c. Cath/PCI: LM nl, LAD20p, 56m, D1 nl, D2 60-70ost, D3 nl, LCX 70ost, 13m, OM1/OM2 min irregs, RCA 30 diff, PDA 99, LIMA->LAD atretic, VG->D1 100, VG->OM1->2 100, VG->PDA->RPL 90p (4.0x23 Vision BMS);  c. 07/2012 Echo: EF 55%, gr1 DD.  [name] Cataract   . CKD (chronic kidney disease), stage III    a. renal bx 2008: GLN with vasculitis  . Diffuse large B cell lymphoma ([place]) 08/07/2016  . GERD (gastroesophageal reflux disease)   . Glaucoma    a. Cannot see out of L eye.  . Hematuria    Microscopic  . Hyperglycemia    Patient reported while on prednisone, had to take insulin  . Hyperlipemia   . Hypertension   . Hypothyroidism   . ILD (interstitial lung disease) ([place])   . Iritis   . Local infection of skin and subcutaneous tissue   . Membranoproliferative nephritis   . Myocardial infarction ([place]) 2002  . Neutropenia, drug-induced ([place])   . Recurrent boils   . Residual foreign body in soft tissue   . Vasculitis ([place])    a.  pauciimmune vasculitis with renal involvement and hx of transient hemoptysis in the past with associated BOOP, 2008 (renal bx 2008: GLN with vasculitis).  b. History of treatment with 2 cycles of Cytoxan and pheresis. H/o hemoptysis and pulm hemorrhage with 2nd cycle of cytoxan.    SURGICAL HISTORY: Past Surgical History:  Procedure Laterality Date  . BOWEL RESECTION N/A 07/12/2016   Procedure: SMALL BOWEL RESECTION;  Surgeon: Rolm Bookbinder, MD;  Location: [facility];  Service: General;  Laterality: N/A;  . CATARACT EXTRACTION Bilateral   . CORONARY ARTERY BYPASS GRAFT  2002  . IR FLUORO GUIDE PORT INSERTION RIGHT  08/20/2016  . IR US GUIDE VASC ACCESS RIGHT  08/20/2016  . LAPAROSCOPY N/A 07/12/2016   Procedure: LAPAROSCOPY  DIAGNOSTIC;  Surgeon: Rolm Bookbinder, MD;  Location: [facility];  Service: General;  Laterality: N/A;  . LEFT HEART CATHETERIZATION WITH CORONARY/GRAFT ANGIOGRAM N/A 07/26/2012   Procedure: LEFT HEART CATHETERIZATION WITH Beatrix Fetters;  Surgeon: Wellington Hampshire, MD;  Location: [facility] CATH LAB;  Service: Cardiovascular;  Laterality: N/A;  . LUNG BIOPSY    . PERCUTANEOUS CORONARY STENT INTERVENTION (PCI-S)  07/26/2012   Procedure: PERCUTANEOUS CORONARY STENT INTERVENTION (PCI-S);  Surgeon: Wellington Hampshire, MD;  Location: [facility] CATH LAB;  Service: Cardiovascular;;  . RENAL BIOPSY    . TOE AMPUTATION  2009   hammer toe    SOCIAL HISTORY: Social History   Social History  . Marital status: Married    Spouse name: N/A  . Number of children: N/A  . Years of education: N/A   Occupational History  . [place] History Main Topics  . Smoking status: Never Smoker  . Smokeless tobacco: Never Used  . Alcohol use No  . Drug use: No  . Sexual activity: Not on file   Other Topics Concern  . Not on file   Social History Narrative   Regular exercise - yes      [facility] (07/12/16):   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  no pets currently. No bird or mold exposure.    FAMILY HISTORY: Family History  Problem Relation Age of Onset  . Heart disease Mother   . Diabetes Mother   . Prostate cancer Father   . Depression Other   . Diabetes Other   . Prostate cancer Other   . Colon polyps Neg Hx   . Colon cancer Neg Hx   . Rectal cancer Neg Hx   . Stomach cancer Neg Hx   . Esophageal cancer Neg Hx     ALLERGIES:  is allergic to no known allergies.  MEDICATIONS:  Scheduled Meds: . aspirin EC  81 mg Oral Daily  . clopidogrel  75 mg Oral Daily  . finasteride  5 mg Oral QHS  . levothyroxine  50 mcg Intravenous Daily  . metoprolol tartrate  12.5 mg Oral BID  . pantoprazole  40 mg Oral Daily  . sodium chloride flush  10-40 mL Intracatheter  Q12H  . tamsulosin  0.4 mg Oral QHS   Continuous Infusions: . piperacillin-tazobactam (ZOSYN)  IV 3.375 g (11/03/16 1409)   PRN Meds:.acetaminophen **OR** acetaminophen, morphine injection, ondansetron **OR** ondansetron (ZOFRAN) IV, sodium chloride flush  REVIEW OF SYSTEMS:    10 Point review of Systems was done is negative except as noted above.   LABORATORY DATA:  I have reviewed the data as listed  . CBC Latest Ref Rng & Units 11/03/2016 11/02/2016 11/01/2016  WBC 4.0 - 10.5 K/uL 6.0 17.3(H) 27.6(H)  Hemoglobin 13.0 - 17.0 g/dL 9.5(L) 9.1(L) 8.5(L)  Hematocrit 39.0 - 52.0 % 29.0(L) 28.2(L)  25.5(L)  Platelets 150 - 400 K/uL 103(L) 116(L) 130(L)   . CBC    Component Value Date/Time   WBC 6.0 11/03/2016 0503   RBC 2.99 (L) 11/03/2016 0503   HGB 9.5 (L) 11/03/2016 0503   HGB 10.1 (L) 10/26/2016 1032   HCT 29.0 (L) 11/03/2016 0503   HCT 31.7 (L) 10/26/2016 1032   PLT 103 (L) 11/03/2016 0503   PLT 189 10/26/2016 1032   MCV 97.0 11/03/2016 0503   MCV 101.0 (H) 10/26/2016 1032   MCH 31.8 11/03/2016 0503   MCHC 32.8 11/03/2016 0503   RDW 19.5 (H) 11/03/2016 0503   RDW 20.0 (H) 10/26/2016 1032   LYMPHSABS 0.3 (L) 11/01/2016 0701   LYMPHSABS 0.5 (L) 10/26/2016 1032   MONOABS 0.6 11/01/2016 0701   MONOABS 0.2 10/26/2016 1032   EOSABS 0.0 11/01/2016 0701   EOSABS 0.0 10/26/2016 1032   BASOSABS 0.0 11/01/2016 0701   BASOSABS 0.0 10/26/2016 1032     . CMP Latest Ref Rng & Units 11/03/2016 11/02/2016 11/01/2016  Glucose 65 - 99 mg/dL 107(H) 115(H) 116(H)  BUN 6 - 20 mg/dL 30(H) 40(H) 47(H)  Creatinine 0.61 - 1.24 mg/dL 1.63(H) 1.64(H) 1.70(H)  Sodium 135 - 145 mmol/L 138 138 139  Potassium 3.5 - 5.1 mmol/L 4.3 4.3 4.1  Chloride 101 - 111 mmol/L 106 108 109  CO2 22 - 32 mmol/L 24 24 23   Calcium 8.9 - 10.3 mg/dL 8.5(L) 8.3(L) 8.5(L)  Total Protein 6.5 - 8.1 g/dL - - -  Total Bilirubin 0.3 - 1.2 mg/dL - - -  Alkaline Phos 38 - 126 U/L - - -  AST 15 - 41 U/L - - -  ALT 17  - 63 U/L - - -     RADIOGRAPHIC STUDIES: I have personally reviewed the radiological images as listed and agreed with the findings in the report. Ct Abdomen Pelvis Wo Contrast  Result Date: 10/30/2016 CLINICAL DATA:  Left lower quadrant pain. History of lymphoma with ongoing chemotherapy. EXAM: CT ABDOMEN AND PELVIS WITHOUT CONTRAST TECHNIQUE: Multidetector CT imaging of the abdomen and pelvis was performed following the standard protocol without IV contrast. COMPARISON:  06/15/2016 CT abdomen/ pelvis.  10/22/2016 head CT. FINDINGS: Lower chest: No significant pulmonary nodules or acute consolidative airspace disease. Intact lower sternotomy wires. Coronary atherosclerosis. Low mediastinal blood pool density, indicating anemia. Hepatobiliary: Normal liver size. Several subcentimeter hypodense lesions scattered throughout the liver are too small to characterize and not appreciably changed. No appreciable new liver lesions. Normal gallbladder with no radiopaque cholelithiasis. No biliary ductal dilatation. Pancreas: Normal, with no mass or duct dilation. Spleen: Normal size. No mass. Adrenals/Urinary Tract: Normal adrenals. No renal stones. No hydronephrosis. Small simple bilateral renal cysts, largest 2.4 cm in the posterior interpolar right kidney. Hypodense posterior upper right renal cortical lesion is too small to characterize and not appreciably changed, compatible with a benign renal cysts. No additional contour deforming renal lesions. Normal bladder. Stomach/Bowel: Small hiatal hernia. Otherwise collapsed and grossly normal stomach. Normal caliber small bowel with no small bowel wall thickening. Normal appendix. Moderate colonic diverticulosis, most prominent in the descending and sigmoid colon. Focal mild colonic wall thickening in the descending colon with associated mild pericolonic fat stranding, compatible with acute descending colon diverticulitis. There is associated pericolonic free air at the  site of diverticulitis. Vascular/Lymphatic: Atherosclerotic abdominal aorta with stable 3.3 cm infrarenal abdominal aortic aneurysm. No pathologically enlarged lymph nodes in the abdomen or pelvis. Reproductive: Top-normal size prostate. Other: Scattered pneumoperitoneum  throughout the anterior and upper peritoneal cavity. No ascites. No focal fluid collections. Stable periumbilical subcutaneous scarring. Musculoskeletal: No aggressive appearing focal osseous lesions. Marked thoracolumbar spondylosis. IMPRESSION: 1. Acute perforated diverticulitis of the descending colon. Scattered free air throughout the peritoneal cavity. No focal drainable abscess . 2. Aortic atherosclerosis. Stable 3.3 cm infrarenal abdominal aortic aneurysm. Recommend followup by ultrasound in 3 years. This recommendation follows ACR consensus guidelines: White Paper of the ACR Incidental Findings Committee II on Vascular Findings. J Am Coll Radiol 2013; 10:789-794. 3. Additional chronic findings as above. Critical Value/emergent results were called by telephone at the time of interpretation on 10/30/2016 at 2:14 pm to Dr. Isla Pence , who verbally acknowledged these results. Electronically Signed   By: Ilona Sorrel M.D.   On: 10/30/2016 14:16   Nm Pet Image Restag (ps) Skull Base To Thigh  Result Date: 10/22/2016 CLINICAL DATA:  Subsequent treatment strategy for diffuse large B-cell non-Hodgkin's lymphoma. EXAM: NUCLEAR MEDICINE PET SKULL BASE TO THIGH TECHNIQUE: 11.15 mCi F-18 FDG was injected intravenously. Full-ring PET imaging was performed from the skull base to thigh after the radiotracer. CT data was obtained and used for attenuation correction and anatomic localization. FASTING BLOOD GLUCOSE:  Value: 102 mg/dl COMPARISON:  None. FINDINGS: NECK No hypermetabolic lymph nodes in the neck. CHEST The FDG avid right axillary lymph nodes and epicardial lymph nodes have resolved in the interval. The focal hypermetabolic activity in the  left ventricular myocardium described on the previous study is unchanged. No new abnormalities in the chest. ABDOMEN/PELVIS Significant improvement in the abdomen. No definitive FDG avid disease remains in the liver or spleen. The adenopathy in the abdomen has resolved. Minimal uptake in the normal appearing left adrenal gland is of no significance. No abnormal uptake seen in the soft tissues of the pelvis. The omental nodularity seen previously has resolved. There is increased attenuation in the fat associated with the anterior ventral hernia with low level uptake, likely inflammatory. The maximum SUV is measured on image 161 is 3.7. SKELETON There has been significant overall improvement in the bones as well. Uptake in the anterior left acetabulum has significantly diminished with a maximum SUV of 3.4 today versus 29.4 previously. The left iliac uptake seen previously is no longer significantly FDG avid. There is mild focal uptake in the posterior left iliac bone on series 4, image 166 which was not seen previously and demonstrates a maximum SUV of 4.26 today. No other bony abnormalities are identified. IMPRESSION: 1. Significant interval improvement. No FDG avid disease remains in the soft tissues of the neck, chest, abdomen, or pelvis. Very mild uptake remains in the known left anterior acetabular lesion. Mild new uptake in the posterior left iliac bone is nonspecific but could represent mild involvement. Recommend attention on follow-up. 2. Increased attenuation and mild uptake in the fat of a lower midline ventral hernia is likely inflammatory. Recommend attention on follow-up. 3. No other interval change. Electronically Signed   By: Dorise Bullion III M.D   On: 10/22/2016 15:33    ASSESSMENT & PLAN:   81 yo with   81 yo male with multiple medical co-morbidities including cardiac co-morbidities, IPF, vascular on chronic immunosupression with   1)Stage IV Diffuse large B-cell lymphoma involving the  small intestine (germinal cell phenotype) with complete thickness involvement of bowel and abutting serosa.  Initial PET/CT scan showed extensive involvement with DLBCL, hypermetabolic nodal involvement of the right axilla, pericardial space, porta hepatis/mesenteric root, and retroperitoneum, there is extensive  involvement of the liver as well as peritoneal involvement along the liver surface, splenic surface, and upper omentum. 2. Hypermetabolic bony lesions in the left pelvis compatible with malignant involvement. 3. There is a focus of hypermetabolic activity along the anterolateral wall of the left ventricle.   s/p 4 cycles of R-CEOP .  PET/CT after 3 cycles showed excellent metabolic response to treatment.  - Status post neutropenic fever and E.coli/pseudomonal bacteremia/HCAP after the first cycle of treatment. Result Significant chemotherapy related thrombocytopenia - now resolved.  2) chronic kidney disease stage III baseline creatinine about 2 3) coronary disease status post PCI and CABG - had a myocardial perfusion imaging scan on 06/30/2016 that showed a moderately decreased ejection fraction of 30-44%- this precludes anthracycline use currently. 4) history of pulmonary and renal vasculitis on chronic Imuran. Now off Imuran considering chemotherapy and Rituxan use.  5) Admitted with Acute perforated Diverticulitis - being managed conservatively. Plan - continue conservative mx of acute perforated diverticulitis with IV fluids, electrolye management, pain medications when necessary. -he is clinically much improved and has been advanced to full liquids as per surgery and has tolerated this well and had 2-3 BM today. -Mild anemia, thrombocytopenia. Expect his counts will nadir between D7 and D12 post treatment on 10/26/2016. -monitor daily cbc with diff - surgery team following along -diet advanced as per surgery team recommendations. -primary team planning to get rpt CT abd  tomorrow to evaluate any interval change. -he is scheduled for C5 of R-CEOP from 11/16/2016 but this might need to be delayed depending on how he feels, status of his perf diverticulitis and counts -transfuse PRBC prn for hgb<8 or if symptomatic  RTC in with Dr Irene Limbo in as per apppointment in clinic on 11/11/2016 with labs  I spent 20 minutes counseling the patient face to face. The total time spent in the appointment was 25 minutes and more than 50% was on counseling and direct patient cares.    Sullivan Lone MD [place] AAHIVMS [facility] Hematology/Oncology Physician [facility]  (Office):       [phone] (Work cell):  [phone] (Fax):           [phone]

## 2016-11-03 NOTE — Progress Notes (Signed)
PROGRESS NOTE    Luke Mcbride  ELF:[id number] DOB: [birth year] DOA: 10/30/2016 PCP: Donato Heinz, MD     Brief Narrative:  Luke Mcbride is a 81 y.o. male with a past medical history of chronic kidney disease stage III, history of coronary artery disease status post bypass and stent, last stents placed in 2014, history of non-Hodgkin's lymphoma undergoing chemotherapy with last chemotherapy given on June 12, who presents with complaints of abdominal pain that started last night. Located in the left lower quadrant. CT scan was done which showed perforated diverticulitis. General surgery has been consulted and patient treated conservatively with IV antibiotics.   Assessment & Plan:   Principal Problem:   Diverticulitis of colon with perforation Active Problems:   Hypothyroidism   Essential hypertension, benign   INTERSTITIAL LUNG DISEASE   CKD (chronic kidney disease), stage III   Diffuse large B cell lymphoma ([facility])   Acute diverticulitis   Acute diverticulitis with perforation -Continue Zosyn -General surgery following -Repeat CT abdomen pelvis tomorrow  -Slowly advance diet as tolerated  Stage IV large B cell lymphoma -Follow-up by oncology -Dr. 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 Marval Regal, Seen by Dr. Lake Bells  -Now off Imuran   Hypothyroidism -Continue Synthroid  BPH -Continue proscar, flomax   DVT prophylaxis: SCD Code Status: Full Family Communication: No family at bedside Disposition Plan: pending improvement, home    Consultants:   Surgery  Oncology  Procedures:   None  Antimicrobials:  Anti-infectives    Start     Dose/Rate Route Frequency Ordered Stop   10/30/16 2200  piperacillin-tazobactam (ZOSYN) IVPB 3.375 g     3.375 g 12.5  mL/hr over 240 Minutes Intravenous Every 8 hours 10/30/16 1719     10/30/16 1430  piperacillin-tazobactam (ZOSYN) IVPB 3.375 g     3.375 g 100 mL/hr over 30 Minutes Intravenous  Once 10/30/16 1423 10/30/16 1654        Subjective: Patient doing well today. No complaints. Denies any nausea, vomiting, abdominal pain. Tolerated full liquid diet this morning.  Objective: Vitals:   11/02/16 0448 11/02/16 1400 11/02/16 2010 11/03/16 0452  BP: 133/70 (!) 156/84 (!) 146/72 133/87  Pulse: 80 76 84 67  Resp: 18 17 16 18   Temp: 98.2 F (36.8 C) 98.2 F (36.8 C) 98 F (36.7 C) 97.8 F (36.6 C)  TempSrc: Oral Oral Oral Oral  SpO2: 97% 98% 99% 98%  Weight:      Height:        Intake/Output Summary (Last 24 hours) at 11/03/16 1355 Last data filed at 11/03/16 1000  Gross per 24 hour  Intake             1710 ml  Output             1875 ml  Net             -165 ml   Filed Weights   10/31/16 1300  Weight: 103.3 kg (227 lb 11.8 oz)    Examination:  General exam: Appears calm and comfortable  Respiratory system: Clear to auscultation. Respiratory effort normal. Cardiovascular system: S1 & S2 heard, RRR. No JVD, murmurs, rubs, gallops or clicks. No pedal edema. Gastrointestinal system: Abdomen is nondistended, soft and nontender. No organomegaly or masses felt. Normal bowel sounds heard. Central  nervous system: Alert and oriented. No focal neurological deficits. Extremities: Symmetric 5 x 5 power. Skin: No rashes, lesions or ulcers Psychiatry: Judgement and insight appear normal. Mood & affect appropriate.   Data Reviewed: I have personally reviewed following labs and imaging studies  CBC:  Recent Labs Lab 10/30/16 1043 10/31/16 0842 11/01/16 0701 11/02/16 0544 11/03/16 0503  WBC 35.7* 39.0* 27.6* 17.3* 6.0  NEUTROABS  --   --  26.7*  --   --   HGB 8.9* 8.7* 8.5* 9.1* 9.5*  HCT 27.1* 25.8* 25.5* 28.2* 29.0*  MCV 99.6 98.5 97.3 98.9 97.0  PLT 168 151 130* 116* 103*    Basic Metabolic Panel:  Recent Labs Lab 10/30/16 1043 10/31/16 0842 11/01/16 0701 11/02/16 0544 11/03/16 0503  NA 137 139 139 138 138  K 4.1 4.6 4.1 4.3 4.3  CL 108 110 109 108 106  CO2 22 22 23 24 24   GLUCOSE 176* 115* 116* 115* 107*  BUN 54* 49* 47* 40* 30*  CREATININE 2.00* 1.74* 1.70* 1.64* 1.63*  CALCIUM 8.6* 8.5* 8.5* 8.3* 8.5*   GFR: Estimated Creatinine Clearance: 46.4 mL/min (A) (by C-G formula based on SCr of 1.63 mg/dL (H)). Liver Function Tests:  Recent Labs Lab 10/30/16 1043  AST 27  ALT 32  ALKPHOS 59  BILITOT 0.6  PROT 5.4*  ALBUMIN 3.1*    Recent Labs Lab 10/30/16 1043  LIPASE 37   No results for input(s): AMMONIA in the last 168 hours. Coagulation Profile: No results for input(s): INR, PROTIME in the last 168 hours. Cardiac Enzymes: No results for input(s): CKTOTAL, CKMB, CKMBINDEX, TROPONINI in the last 168 hours. BNP (last 3 results) No results for input(s): PROBNP in the last 8760 hours. HbA1C:  Recent Labs  11/01/16 0701  HGBA1C 6.1*   CBG: No results for input(s): GLUCAP in the last 168 hours. Lipid Profile: No results for input(s): CHOL, HDL, LDLCALC, TRIG, CHOLHDL, LDLDIRECT in the last 72 hours. Thyroid Function Tests: No results for input(s): TSH, T4TOTAL, FREET4, T3FREE, THYROIDAB in the last 72 hours. Anemia Panel: No results for input(s): VITAMINB12, FOLATE, FERRITIN, TIBC, IRON, RETICCTPCT in the last 72 hours. Sepsis Labs: No results for input(s): PROCALCITON, LATICACIDVEN in the last 168 hours.  No results found for this or any previous visit (from the past 240 hour(s)).     Radiology Studies: No results found.    Scheduled Meds: . levothyroxine  50 mcg Intravenous Daily  . metoprolol tartrate  5 mg Intravenous Q8H  . pantoprazole (PROTONIX) IV  40 mg Intravenous Q24H  . sodium chloride flush  10-40 mL Intracatheter Q12H   Continuous Infusions: . piperacillin-tazobactam (ZOSYN)  IV Stopped (11/03/16  0909)     LOS: 4 days    Time spent: 40 minutes   Dessa Phi, DO [facility] [email or web address] Password [email or web address] 11/03/2016, 1:55 PM

## 2016-11-03 NOTE — Progress Notes (Signed)
    CC: abdominal pain  Subjective: Almost no pain, doing well with Clears.  Overall feels much better.  Objective: Vital signs in last 24 hours: Temp:  [97.8 F (36.6 C)-98.2 F (36.8 C)] 97.8 F (36.6 C) (06/20 0452) Pulse Rate:  [67-84] 67 (06/20 0452) Resp:  [16-18] 18 (06/20 0452) BP: (133-156)/(72-87) 133/87 (06/20 0452) SpO2:  [98 %-99 %] 98 % (06/20 0452) Last BM Date: 11/02/16 1440 PO 100 IV 2175 urine BM x 2 Afebrile, VSS labs OK, creatinine is stable Intake/Output from previous day: 06/19 0701 - 06/20 0700 In: 1540 [P.O.:1440; IV Piggyback:100] Out: 2175 [Urine:2175] Intake/Output this shift: No intake/output data recorded.  General appearance: alert, cooperative and no distress Resp: clear to auscultation bilaterally GI: soft, he reports being a little tender LLQ, but you have to ask.  It is not apparent on exam.  Lab Results:   Recent Labs  11/02/16 0544 11/03/16 0503  WBC 17.3* 6.0  HGB 9.1* 9.5*  HCT 28.2* 29.0*  PLT 116* 103*    BMET  Recent Labs  11/02/16 0544 11/03/16 0503  NA 138 138  K 4.3 4.3  CL 108 106  CO2 24 24  GLUCOSE 115* 107*  BUN 40* 30*  CREATININE 1.64* 1.63*  CALCIUM 8.3* 8.5*   PT/INR No results for input(s): LABPROT, INR in the last 72 hours.   Recent Labs Lab 10/30/16 1043  AST 27  ALT 32  ALKPHOS 59  BILITOT 0.6  PROT 5.4*  ALBUMIN 3.1*     Lipase     Component Value Date/Time   LIPASE 37 10/30/2016 1043     Medications: . levothyroxine  50 mcg Intravenous Daily  . metoprolol tartrate  5 mg Intravenous Q8H  . pantoprazole (PROTONIX) IV  40 mg Intravenous Q24H  . sodium chloride flush  10-40 mL Intracatheter Q12H   . piperacillin-tazobactam (ZOSYN)  IV 3.375 g (11/03/16 0509)   Anti-infectives    Start     Dose/Rate Route Frequency Ordered Stop   10/30/16 2200  piperacillin-tazobactam (ZOSYN) IVPB 3.375 g     3.375 g 12.5 mL/hr over 240 Minutes Intravenous Every 8 hours 10/30/16 1719      10/30/16 1430  piperacillin-tazobactam (ZOSYN) IVPB 3.375 g     3.375 g 100 mL/hr over 30 Minutes Intravenous  Once 10/30/16 1423 10/30/16 1654      Assessment/Plan Diverticulitis with perforation  Large B cell lymphoma - chemotherapy 10/28/16 CAD - CABG x 6 05/2000,  Stent 2015 CKI- vasculitis (He was chronically on Imuran until the lymphoma diagnosis) Creatinine - 2.0 - 10/30/2016 (has been running around 1.8) Seen by Dr. Marval Regal Lung disease - Seen by Dr. Lake Bells - 07/01/2016 for preop evaluation FEN- IV fluidsNPO, clears liquids VTE- SCDs ID- IV Zosyn 6/16 =>> day 5  Plan:  Full liquids, I will check on repeating CT tomorrow or Friday and then hope to transition to PO abx.  Will let Medicine start converting to PO meds if he does well today.      LOS: 4 days    [name],[name] 11/03/2016 [phone]  Agree with above. He looks good.  Will plan to repeat CT scan tomorrow - so we have new "baseline" as to what his bowel looks like.  Then plan discharge on Friday. Wife in room.    Alphonsa Overall, MD, [facility] Surgery Pager: [phone] Office phone:  [phone]

## 2016-11-04 ENCOUNTER — Inpatient Hospital Stay (HOSPITAL_COMMUNITY): Payer: Medicare Other

## 2016-11-04 LAB — BASIC METABOLIC PANEL
ANION GAP: 8 (ref 5–15)
BUN: 27 mg/dL — AB (ref 6–20)
CO2: 23 mmol/L (ref 22–32)
Calcium: 8.1 mg/dL — ABNORMAL LOW (ref 8.9–10.3)
Chloride: 105 mmol/L (ref 101–111)
Creatinine, Ser: 1.82 mg/dL — ABNORMAL HIGH (ref 0.61–1.24)
GFR calc Af Amer: 39 mL/min — ABNORMAL LOW (ref >60)
GFR, EST NON AFRICAN AMERICAN: 34 mL/min — AB (ref >60)
GLUCOSE: 147 mg/dL — AB (ref 65–99)
Potassium: 4.3 mmol/L (ref 3.5–5.1)
SODIUM: 136 mmol/L (ref 135–145)

## 2016-11-04 LAB — CBC WITH DIFFERENTIAL/PLATELET
BASOS ABS: 0 10*3/uL (ref 0.0–0.1)
Basophils Relative: 0 %
EOS ABS: 0.1 10*3/uL (ref 0.0–0.7)
Eosinophils Relative: 2 %
HCT: 27 % — ABNORMAL LOW (ref 39.0–52.0)
Hemoglobin: 8.8 g/dL — ABNORMAL LOW (ref 13.0–17.0)
LYMPHS PCT: 15 %
Lymphs Abs: 0.7 10*3/uL (ref 0.7–4.0)
MCH: 31.2 pg (ref 26.0–34.0)
MCHC: 32.6 g/dL (ref 30.0–36.0)
MCV: 95.7 fL (ref 78.0–100.0)
Monocytes Absolute: 0.5 10*3/uL (ref 0.1–1.0)
Monocytes Relative: 12 %
NEUTROS ABS: 3.2 10*3/uL (ref 1.7–7.7)
Neutrophils Relative %: 71 %
Platelets: 91 10*3/uL — ABNORMAL LOW (ref 150–400)
RBC: 2.82 MIL/uL — AB (ref 4.22–5.81)
RDW: 19.3 % — AB (ref 11.5–15.5)
WBC: 4.5 10*3/uL (ref 4.0–10.5)

## 2016-11-04 MED ORDER — IOPAMIDOL (ISOVUE-300) INJECTION 61%
INTRAVENOUS | Status: AC
  Administered 2016-11-04: 15 mL
  Filled 2016-11-04: qty 30

## 2016-11-04 MED ORDER — IOPAMIDOL (ISOVUE-300) INJECTION 61%: 15.0000 mL | Freq: Once | INTRAVENOUS | Status: DC | PRN

## 2016-11-04 MED ORDER — SODIUM CHLORIDE 0.9 % IV SOLN
INTRAVENOUS | Status: DC
  Administered 2016-11-04 (×2): via INTRAVENOUS
  Administered 2016-11-05: 1000 mL via INTRAVENOUS

## 2016-11-04 MED ORDER — LEVOTHYROXINE SODIUM 100 MCG PO TABS
100.0000 ug | ORAL_TABLET | Freq: Every day | ORAL | Status: DC
  Administered 2016-11-05: 100 ug via ORAL
  Filled 2016-11-04: qty 1

## 2016-11-04 MED ORDER — IOPAMIDOL (ISOVUE-300) INJECTION 61%
INTRAVENOUS | Status: AC
  Administered 2016-11-04: 75 mL
  Filled 2016-11-04: qty 75

## 2016-11-04 NOTE — Progress Notes (Signed)
PROGRESS NOTE    Luke Mcbride  FFM:[id number] DOB: 11-13-1935 DOA: 10/30/2016 PCP: Donato Heinz, MD     Brief Narrative:  Luke Mcbride is a 81 y.o. male with a past medical history of chronic kidney disease stage III, history of coronary artery disease status post bypass and stent, last stents placed in 2014, history of non-Hodgkin's lymphoma undergoing chemotherapy with last chemotherapy given on June 12, who presents with complaints of abdominal pain that started last night. Located in the left lower quadrant. CT scan was done which showed perforated diverticulitis. General surgery has been consulted and patient treated conservatively with IV antibiotics.   Assessment & Plan:   Principal Problem:   Diverticulitis of colon with perforation Active Problems:   Hypothyroidism   Essential hypertension, benign   INTERSTITIAL LUNG DISEASE   CKD (chronic kidney disease), stage III   Diffuse large B cell lymphoma (HCC)   Acute diverticulitis   Immunosuppressed due to chemotherapy   Acute diverticulitis with perforation -Continue Zosyn -General surgery following -Repeat CT abdomen pelvis pending today  -Slowly advance diet as tolerated, tolerating full liquids   Stage IV large B cell lymphoma -Follow-up by oncology -Dr. 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 Marval Regal, Seen by Dr. Lake Bells  -Now off Imuran   Hypothyroidism -Continue Synthroid  BPH -Continue proscar, flomax  CKD stage 3 -Baseline Cr 1.9-2    DVT prophylaxis: SCD Code Status: Full Family Communication: Wife at bedside Disposition Plan: pending improvement, home    Consultants:   Surgery  Oncology  Procedures:   None  Antimicrobials:  Anti-infectives    Start     Dose/Rate Route  Frequency Ordered Stop   10/30/16 2200  piperacillin-tazobactam (ZOSYN) IVPB 3.375 g     3.375 g 12.5 mL/hr over 240 Minutes Intravenous Every 8 hours 10/30/16 1719     10/30/16 1430  piperacillin-tazobactam (ZOSYN) IVPB 3.375 g     3.375 g 100 mL/hr over 30 Minutes Intravenous  Once 10/30/16 1423 10/30/16 1654       Subjective: Patient doing well today.  Drinking oral contrast. No complaints of nausea, vomiting, abdominal pain. Had a normal bowel movement without blood.  Objective: Vitals:   11/03/16 1400 11/03/16 2140 11/04/16 0519 11/04/16 1057  BP: 114/66 126/65 119/68 112/62  Pulse: 84 79 82 78  Resp: 19 19 20 20   Temp: 98.3 F (36.8 C) 97.7 F (36.5 C) 97.5 F (36.4 C)   TempSrc: Oral Oral    SpO2: 98% 100% 96%   Weight:      Height:        Intake/Output Summary (Last 24 hours) at 11/04/16 1319 Last data filed at 11/04/16 1025  Gross per 24 hour  Intake           1482.5 ml  Output              725 ml  Net            757.5 ml   Filed Weights   10/31/16 1300  Weight: 103.3 kg (227 lb 11.8 oz)    Examination:  General exam: Appears calm and comfortable  Respiratory system: Clear to auscultation. Respiratory effort normal. Cardiovascular system: S1 & S2 heard, RRR. No JVD, murmurs, rubs, gallops or clicks. No pedal edema. Gastrointestinal system: Abdomen is  nondistended, soft and nontender. No organomegaly or masses felt. Normal bowel sounds heard. Central nervous system: Alert and oriented. No focal neurological deficits. Extremities: Symmetric 5 x 5 power. Skin: No rashes, lesions or ulcers Psychiatry: Judgement and insight appear normal. Mood & affect appropriate.   Data Reviewed: I have personally reviewed following labs and imaging studies  CBC:  Recent Labs Lab 10/31/16 0842 11/01/16 0701 11/02/16 0544 11/03/16 0503 11/04/16 0339  WBC 39.0* 27.6* 17.3* 6.0 4.5  NEUTROABS  --  26.7*  --   --  3.2  HGB 8.7* 8.5* 9.1* 9.5* 8.8*  HCT 25.8* 25.5*  28.2* 29.0* 27.0*  MCV 98.5 97.3 98.9 97.0 95.7  PLT 151 130* 116* 103* 91*   Basic Metabolic Panel:  Recent Labs Lab 10/31/16 0842 11/01/16 0701 11/02/16 0544 11/03/16 0503 11/04/16 0339  NA 139 139 138 138 136  K 4.6 4.1 4.3 4.3 4.3  CL 110 109 108 106 105  CO2 22 23 24 24 23   GLUCOSE 115* 116* 115* 107* 147*  BUN 49* 47* 40* 30* 27*  CREATININE 1.74* 1.70* 1.64* 1.63* 1.82*  CALCIUM 8.5* 8.5* 8.3* 8.5* 8.1*   GFR: Estimated Creatinine Clearance: 41.6 mL/min (A) (by C-G formula based on SCr of 1.82 mg/dL (H)). Liver Function Tests:  Recent Labs Lab 10/30/16 1043  AST 27  ALT 32  ALKPHOS 59  BILITOT 0.6  PROT 5.4*  ALBUMIN 3.1*    Recent Labs Lab 10/30/16 1043  LIPASE 37   No results for input(s): AMMONIA in the last 168 hours. Coagulation Profile: No results for input(s): INR, PROTIME in the last 168 hours. Cardiac Enzymes: No results for input(s): CKTOTAL, CKMB, CKMBINDEX, TROPONINI in the last 168 hours. BNP (last 3 results) No results for input(s): PROBNP in the last 8760 hours. HbA1C: No results for input(s): HGBA1C in the last 72 hours. CBG: No results for input(s): GLUCAP in the last 168 hours. Lipid Profile: No results for input(s): CHOL, HDL, LDLCALC, TRIG, CHOLHDL, LDLDIRECT in the last 72 hours. Thyroid Function Tests: No results for input(s): TSH, T4TOTAL, FREET4, T3FREE, THYROIDAB in the last 72 hours. Anemia Panel: No results for input(s): VITAMINB12, FOLATE, FERRITIN, TIBC, IRON, RETICCTPCT in the last 72 hours. Sepsis Labs: No results for input(s): PROCALCITON, LATICACIDVEN in the last 168 hours.  No results found for this or any previous visit (from the past 240 hour(s)).     Radiology Studies: No results found.    Scheduled Meds: . aspirin EC  81 mg Oral Daily  . clopidogrel  75 mg Oral Daily  . finasteride  5 mg Oral QHS  . levothyroxine  100 mcg Oral Daily  . metoprolol tartrate  12.5 mg Oral BID  . pantoprazole  40 mg  Oral Daily  . sodium chloride flush  10-40 mL Intracatheter Q12H  . tamsulosin  0.4 mg Oral QHS   Continuous Infusions: . sodium chloride 75 mL/hr at 11/04/16 1115  . piperacillin-tazobactam (ZOSYN)  IV Stopped (11/04/16 1018)     LOS: 5 days    Time spent: 30 minutes   Dessa Phi, DO [facility] [email or web address] Password [email or web address] 11/04/2016, 1:19 PM

## 2016-11-04 NOTE — Progress Notes (Signed)
CC:  Abdominal pain  Subjective: He is annoyed this Am, they wouldn't let him eat and then gave him 2 bottles of contrast to drink for his CT scan.  No pain on exam this week  His wife is with him and a little concerned with drop in CBC values.   Objective: Vital signs in last 24 hours: Temp:  [97.5 F (36.4 C)-98.3 F (36.8 C)] 97.5 F (36.4 C) (06/21 0519) Pulse Rate:  [79-84] 82 (06/21 0519) Resp:  [19-20] 20 (06/21 0519) BP: (114-126)/(65-68) 119/68 (06/21 0519) SpO2:  [96 %-100 %] 96 % (06/21 0519) Last BM Date: 11/02/16 1320 PO 50 IV 775 urine No BM recorded Afebrile, VSS Creatinine is up some, - will use oral contrast, but not IV WBC down to 4.5K, platelets also dropping    Intake/Output from previous day: 06/20 0701 - 06/21 0700 In: 1370 [P.O.:1320; IV Piggyback:50] Out: 517 [Urine:775] Intake/Output this shift: No intake/output data recorded.  General appearance: alert, cooperative and no distress GI: soft, non-tender; bowel sounds normal; no masses,  no organomegaly  Lab Results:   Recent Labs  11/03/16 0503 11/04/16 0339  WBC 6.0 4.5  HGB 9.5* 8.8*  HCT 29.0* 27.0*  PLT 103* 91*    BMET  Recent Labs  11/03/16 0503 11/04/16 0339  NA 138 136  K 4.3 4.3  CL 106 105  CO2 24 23  GLUCOSE 107* 147*  BUN 30* 27*  CREATININE 1.63* 1.82*  CALCIUM 8.5* 8.1*   PT/INR No results for input(s): LABPROT, INR in the last 72 hours.   Recent Labs Lab 10/30/16 1043  AST 27  ALT 32  ALKPHOS 59  BILITOT 0.6  PROT 5.4*  ALBUMIN 3.1*     Lipase     Component Value Date/Time   LIPASE 37 10/30/2016 1043     Medications: . iopamidol      . aspirin EC  81 mg Oral Daily  . clopidogrel  75 mg Oral Daily  . finasteride  5 mg Oral QHS  . levothyroxine  50 mcg Intravenous Daily  . metoprolol tartrate  12.5 mg Oral BID  . pantoprazole  40 mg Oral Daily  . sodium chloride flush  10-40 mL Intracatheter Q12H  . tamsulosin  0.4 mg Oral QHS    . piperacillin-tazobactam (ZOSYN)  IV 3.375 g (11/04/16 0618)   Anti-infectives    Start     Dose/Rate Route Frequency Ordered Stop   10/30/16 2200  piperacillin-tazobactam (ZOSYN) IVPB 3.375 g     3.375 g 12.5 mL/hr over 240 Minutes Intravenous Every 8 hours 10/30/16 1719     10/30/16 1430  piperacillin-tazobactam (ZOSYN) IVPB 3.375 g     3.375 g 100 mL/hr over 30 Minutes Intravenous  Once 10/30/16 1423 10/30/16 1654      Assessment/Plan  Diverticulitis with perforation  Large B cell lymphoma- chemotherapy 10/28/16 CAD- CABG x 6 05/2000, Stent 2015 Prior MI with large scar left Circ distribution  EF on 06/30/16 - 40% CKI- vasculitis (He was chronically on Imuran until the lymphoma diagnosis) Creatinine - 2.0 - 10/30/2016 (has been running around 1.8) Seen by Dr. Marval Regal Lung disease - Seen by Dr. Lake Bells - 07/01/2016 for preop evaluation FEN- IV fluidsNPO, clears liquids VTE- SCDs ID- IV Zosyn 6/16 =>> day 6   Plan:  I am going to restart him on some IV fluids, just do Oral contrast CT and will review later in the day after results are back.  Follow labs  closely.   CT scan just read and available:    There is been interval increase in volume of pneumoperitoneum when compared with 10/30/2016 presumably secondary to perforated diverticulitis. 2. There is a small gas and fluid collection adjacent to the descending colon at the site of the perforation. I talked with Dr. Maylene Roes and I am going to leave him on full liquids and IV abx for now and let Dr. Lucia Gaskins review CT scan.   LOS: 5 days    [name],Luke Mcbride 11/04/2016 [phone]  He has more "free air" on CT scan, but is clinically doing better.  I would treat the patient and not the CT scan. He should be able to go home tomorrow.  The bigger question will be the timing of his next chemotx.  His treatment is very likely to re-exacerbate his diverticular disease.  He is scheduled for his next  treatment on the week of July 4.  He said that he has 2 more treatments.  Dr. Irene Limbo reflects this difficult decision in his notes.  Alphonsa Overall, MD, [facility] Surgery Pager: [phone] Office phone:  [phone]

## 2016-11-05 LAB — BASIC METABOLIC PANEL
ANION GAP: 9 (ref 5–15)
BUN: 20 mg/dL (ref 6–20)
CHLORIDE: 108 mmol/L (ref 101–111)
CO2: 21 mmol/L — AB (ref 22–32)
Calcium: 8 mg/dL — ABNORMAL LOW (ref 8.9–10.3)
Creatinine, Ser: 1.76 mg/dL — ABNORMAL HIGH (ref 0.61–1.24)
GFR calc non Af Amer: 35 mL/min — ABNORMAL LOW (ref >60)
GFR, EST AFRICAN AMERICAN: 41 mL/min — AB (ref >60)
Glucose, Bld: 124 mg/dL — ABNORMAL HIGH (ref 65–99)
Potassium: 4.1 mmol/L (ref 3.5–5.1)
Sodium: 138 mmol/L (ref 135–145)

## 2016-11-05 MED ORDER — AMOXICILLIN-POT CLAVULANATE 875-125 MG PO TABS
1.0000 | ORAL_TABLET | Freq: Two times a day (BID) | ORAL | Status: DC
  Administered 2016-11-05: 1 via ORAL
  Filled 2016-11-05: qty 1

## 2016-11-05 MED ORDER — SACCHAROMYCES BOULARDII 250 MG PO CAPS
250.0000 mg | ORAL_CAPSULE | Freq: Two times a day (BID) | ORAL | Status: DC
  Administered 2016-11-05: 250 mg via ORAL
  Filled 2016-11-05: qty 1

## 2016-11-05 MED ORDER — HEPARIN SOD (PORK) LOCK FLUSH 100 UNIT/ML IV SOLN
500.0000 [IU] | INTRAVENOUS | Status: AC | PRN
  Administered 2016-11-05: 500 [IU]

## 2016-11-05 MED ORDER — AMOXICILLIN-POT CLAVULANATE 875-125 MG PO TABS: 1.0000 | ORAL_TABLET | Freq: Two times a day (BID) | ORAL | 0 refills | Status: AC

## 2016-11-05 MED ORDER — SACCHAROMYCES BOULARDII 250 MG PO CAPS: 250.0000 mg | ORAL_CAPSULE | Freq: Two times a day (BID) | ORAL | 0 refills | Status: AC

## 2016-11-05 NOTE — Discharge Summary (Signed)
Physician Discharge Summary  Luke Mcbride ZMO:[id number] DOB: [birth year] DOA: 10/30/2016  PCP: Donato Heinz, MD  Admit date: 10/30/2016 Discharge date: 11/05/2016  Admitted From: Home Disposition:  Home  Recommendations for Outpatient Follow-up:  1. Follow up with PCP in 1 week 2. Follow up with Dr. Lucia Gaskins, general surgery, in 2 weeks 3. Follow up with Dr. Irene Limbo as scheduled on 6/28 4. Please obtain BMP/CBC in 1 week   Discharge Condition: Stable CODE STATUS: Full  Diet recommendation: Soft diet, advance as tolerated to heart healthy  Brief/Interim Summary: Edie Darley Watkinsis a 81 y.o.malewith a past medical history of chronic kidney disease stage III, history of coronary artery disease status post bypass and stent, last stents placed in 2014, history of non-Hodgkin's lymphoma undergoing chemotherapy with last chemotherapy given on June 12, who presents with complaints of abdominal pain that started last night. Located in the left lower quadrant. CT scan was done which showed perforated diverticulitis. General surgery has been consulted and patient treated conservatively with IV antibiotics. Dr. Irene Limbo with oncology has also been following patient. Patient continues to improve slowly with IV antibiotics. Diet was slowly advanced. CT abdomen and pelvis was repeated which did show increase in pneumoperitoneum. This was discussed with general surgery. As patient was clinically improved, they elected to continue conservative therapy with antibiotics. Patient is to follow-up with Dr. Lucia Gaskins and Dr. Irene Limbo as outpatient.  Discharge Diagnoses:  Principal Problem:   Diverticulitis of colon with perforation Active Problems:   Hypothyroidism   Essential hypertension, benign   INTERSTITIAL LUNG DISEASE   CKD (chronic kidney disease), stage III   Diffuse large B cell lymphoma ([place])   Acute diverticulitis   Immunosuppressed due to chemotherapy   Acute diverticulitis with  perforation -Continue Zosyn --> Augmentin for 3 weeks on discharge. Start probiotics as well.  -General surgery following. Follow up with Dr. Lucia Gaskins in office  -Repeat CT abdomen pelvis 6/21 with increase in pneumoperitoneum, however, patient has clinically improved  -Slowly advance diet as tolerated, discussed soft diet   Stage IV large B cell lymphoma -Dr. Irene Limbo Following. Follow-up with Dr. Irene Limbo in office   History of coronary artery disease status post CABG 2002 and stent placement 2015 -Aspirin/plavix   Essential hypertension -Continue metoprolol, lasix   Hx of pulmonary and renal vasculitis on chronic Imuran  -Followed by Dr. Marval Regal, Seen by Dr. Lake Bells  -Now off Imuran   Hypothyroidism -Continue Synthroid  BPH -Continue proscar, flomax  CKD stage 3 -Baseline Cr 1.9-2  -Stable   Discharge Instructions  Discharge Instructions    Call MD for:  difficulty breathing, headache or visual disturbances    Complete by:  As directed    Call MD for:  extreme fatigue    Complete by:  As directed    Call MD for:  hives    Complete by:  As directed    Call MD for:  persistant dizziness or light-headedness    Complete by:  As directed    Call MD for:  persistant nausea and vomiting    Complete by:  As directed    Call MD for:  severe uncontrolled pain    Complete by:  As directed    Call MD for:  temperature >100.4    Complete by:  As directed    Discharge instructions    Complete by:  As directed    You were cared for by a hospitalist during your hospital stay. If you have any questions about your  discharge medications or the care you received while you were in the hospital after you are discharged, you can call the unit and asked to speak with the hospitalist on call if the hospitalist that took care of you is not available. Once you are discharged, your primary care physician will handle any further medical issues. Please note that NO REFILLS for any discharge  medications will be authorized once you are discharged, as it is imperative that you return to your primary care physician (or establish a relationship with a primary care physician if you do not have one) for your aftercare needs so that they can reassess your need for medications and monitor your lab values.   Increase activity slowly    Complete by:  As directed      Allergies as of 11/05/2016      Reactions   No Known Allergies       Medication List    STOP taking these medications   LORazepam 0.5 MG tablet Commonly known as:  ATIVAN     TAKE these medications   acetaminophen 500 MG tablet Commonly known as:  TYLENOL Take 500-1,000 mg by mouth daily as needed for moderate pain or headache.   amoxicillin-clavulanate 875-125 MG tablet Commonly known as:  AUGMENTIN Take 1 tablet by mouth every 12 (twelve) hours.   aspirin 81 MG EC tablet Take 1 tablet (81 mg total) by mouth daily.   clopidogrel 75 MG tablet Commonly known as:  PLAVIX Take 1 tablet (75 mg total) by mouth daily.   dexamethasone 4 MG tablet Commonly known as:  DECADRON Take 1 tablet (4 mg total) by mouth 2 (two) times daily with a meal. What changed:  when to take this  additional instructions   finasteride 5 MG tablet Commonly known as:  PROSCAR Take 5 mg by mouth at bedtime.   FLOMAX 0.4 MG Caps capsule Generic drug:  tamsulosin Take 0.4 mg by mouth at bedtime.   furosemide 20 MG tablet Commonly known as:  LASIX Take 20 mg by mouth daily.   levothyroxine 100 MCG tablet Commonly known as:  SYNTHROID, LEVOTHROID Take 100 mcg by mouth daily.   lidocaine-prilocaine cream Commonly known as:  EMLA Apply to affected area once   metoprolol tartrate 25 MG tablet Commonly known as:  LOPRESSOR TAKE ONE-HALF TABLET BY  MOUTH TWO TIMES DAILY   multivitamin tablet Take 1 tablet by mouth daily.   NITROSTAT 0.4 MG SL tablet Generic drug:  nitroGLYCERIN PLACE 1 TABLET (0.4 MG TOTAL) UNDER THE  TONGUE EVERY 5 (FIVE) MINUTES AS NEEDED. MAY REPEAT X3 What changed:  reasons to take this  additional instructions   ondansetron 8 MG tablet Commonly known as:  ZOFRAN Take 1 tablet (8 mg total) by mouth 2 (two) times daily as needed for refractory nausea / vomiting. Start on day 3 after cyclophosphamide.   pantoprazole 40 MG tablet Commonly known as:  PROTONIX TAKE 1 TABLET BY MOUTH  DAILY   predniSONE 20 MG tablet Commonly known as:  DELTASONE Take 3 tablets (60 mg total) by mouth daily. Take on days 1-5 of chemotherapy.   prochlorperazine 10 MG tablet Commonly known as:  COMPAZINE Take 1 tablet (10 mg total) by mouth every 6 (six) hours as needed (Nausea or vomiting).   saccharomyces boulardii 250 MG capsule Commonly known as:  FLORASTOR Take 1 capsule (250 mg total) by mouth 2 (two) times daily.   telmisartan 40 MG tablet Commonly known as:  MICARDIS Take  20 mg by mouth daily.   triamcinolone 0.025 % cream Commonly known as:  KENALOG Apply topically 2 (two) times daily.      Follow-up Information    Alphonsa Overall, MD Follow up.   Specialty:  General Surgery Why:  Call for an appointment at the end of your antibiotics course, in 2 weeks. Contact information: [address] [place] [address] [phone]        Donato Heinz, MD. Schedule an appointment as soon as possible for a visit in 1 week(s).   Specialty:  Nephrology Contact information: [place] [address] [phone]        Brunetta Genera, MD. Go to.   Specialties:  Hematology, Oncology Contact information: [address] [place] [address] [phone]          Allergies  Allergen Reactions  . No Known Allergies     Consultations:  General surgery  Oncology    Procedures/Studies: Ct Abdomen Pelvis Wo Contrast  Result Date: 10/30/2016 CLINICAL DATA:  Left lower quadrant pain. History of lymphoma with ongoing  chemotherapy. EXAM: CT ABDOMEN AND PELVIS WITHOUT CONTRAST TECHNIQUE: Multidetector CT imaging of the abdomen and pelvis was performed following the standard protocol without IV contrast. COMPARISON:  06/15/2016 CT abdomen/ pelvis.  10/22/2016 head CT. FINDINGS: Lower chest: No significant pulmonary nodules or acute consolidative airspace disease. Intact lower sternotomy wires. Coronary atherosclerosis. Low mediastinal blood pool density, indicating anemia. Hepatobiliary: Normal liver size. Several subcentimeter hypodense lesions scattered throughout the liver are too small to characterize and not appreciably changed. No appreciable new liver lesions. Normal gallbladder with no radiopaque cholelithiasis. No biliary ductal dilatation. Pancreas: Normal, with no mass or duct dilation. Spleen: Normal size. No mass. Adrenals/Urinary Tract: Normal adrenals. No renal stones. No hydronephrosis. Small simple bilateral renal cysts, largest 2.4 cm in the posterior interpolar right kidney. Hypodense posterior upper right renal cortical lesion is too small to characterize and not appreciably changed, compatible with a benign renal cysts. No additional contour deforming renal lesions. Normal bladder. Stomach/Bowel: Small hiatal hernia. Otherwise collapsed and grossly normal stomach. Normal caliber small bowel with no small bowel wall thickening. Normal appendix. Moderate colonic diverticulosis, most prominent in the descending and sigmoid colon. Focal mild colonic wall thickening in the descending colon with associated mild pericolonic fat stranding, compatible with acute descending colon diverticulitis. There is associated pericolonic free air at the site of diverticulitis. Vascular/Lymphatic: Atherosclerotic abdominal aorta with stable 3.3 cm infrarenal abdominal aortic aneurysm. No pathologically enlarged lymph nodes in the abdomen or pelvis. Reproductive: Top-normal size prostate. Other: Scattered pneumoperitoneum throughout  the anterior and upper peritoneal cavity. No ascites. No focal fluid collections. Stable periumbilical subcutaneous scarring. Musculoskeletal: No aggressive appearing focal osseous lesions. Marked thoracolumbar spondylosis. IMPRESSION: 1. Acute perforated diverticulitis of the descending colon. Scattered free air throughout the peritoneal cavity. No focal drainable abscess . 2. Aortic atherosclerosis. Stable 3.3 cm infrarenal abdominal aortic aneurysm. Recommend followup by ultrasound in 3 years. This recommendation follows ACR consensus guidelines: White Paper of the ACR Incidental Findings Committee II on Vascular Findings. J Am Coll Radiol 2013; 10:789-794. 3. Additional chronic findings as above. Critical Value/emergent results were called by telephone at the time of interpretation on 10/30/2016 at 2:14 pm to Dr. Isla Pence , who verbally acknowledged these results. Electronically Signed   By: Ilona Sorrel M.D.   On: 10/30/2016 14:16   Ct Abdomen Pelvis W Contrast  Result Date: 11/04/2016 CLINICAL DATA:  Perforated  diverticulitis. EXAM: CT ABDOMEN AND PELVIS WITH CONTRAST TECHNIQUE: Multidetector CT imaging of the abdomen and pelvis was performed using the standard protocol following bolus administration of intravenous contrast. CONTRAST:  <See Chart> ISOVUE-300 IOPAMIDOL (ISOVUE-300) INJECTION 61% COMPARISON:  10/30/2016 FINDINGS: Lower chest: Postoperative changes within the right midlung noted. No pleural effusions. Aortic atherosclerosis noted. Hepatobiliary: Small low attenuation structure within the right lobe of liver measures 5 mm and is too small to characterize. Gallbladder is normal. No biliary dilatation. Pancreas: Unremarkable. No pancreatic ductal dilatation or surrounding inflammatory changes. Spleen: The spleen is normal. Adrenals/Urinary Tract: Normal appearance of the adrenal glands. Right kidney cyst measures 2.5 cm. No mass or hydronephrosis noted. The urinary bladder is normal.  Stomach/Bowel: The stomach is normal. There is no pathologic dilatation of the small bowel loops. Enteroenteric anastomosis within the central abdomen is identified. There is abnormal wall thickening and inflammation involving the descending colon at the site of perforated diverticula. There is a small gas and fluid collection identified which measures 2.8 x 1.2 x 3.4 cm. Likely too small to be accessible for drainage. Vascular/Lymphatic: Aortic atherosclerosis. Infrarenal abdominal aortic ectasia measures 2.9 cm, image 55 of series 4. No adenopathy identified. No pelvic or inguinal adenopathy. Reproductive: Prostate is unremarkable. Other: There is been interval increase in volume of pneumoperitoneum throughout the abdomen and pelvis. No significant free fluid identified. Musculoskeletal: Degenerative disc disease noted at L1-2, L2-3 and L5-S1. Bilateral L5 pars defects noted. Anterolisthesis of L5 on S1 noted. IMPRESSION: 1. There is been interval increase in volume of pneumoperitoneum when compared with 10/30/2016 presumably secondary to perforated diverticulitis. 2. There is a small gas and fluid collection adjacent to the descending colon at the site of the perforation. 3.  Aortic Atherosclerosis (ICD10-I70.0). 4. Ectatic abdominal aorta at risk for aneurysm development. Recommend followup by ultrasound in 5 years. This recommendation follows ACR consensus guidelines: White Paper of the ACR Incidental Findings Committee II on Vascular Findings. J Am Coll Radiol 2013; 10:789-794. Electronically Signed   By: Kerby Moors M.D.   On: 11/04/2016 17:05   Nm Pet Image Restag (ps) Skull Base To Thigh  Result Date: 10/22/2016 CLINICAL DATA:  Subsequent treatment strategy for diffuse large B-cell non-Hodgkin's lymphoma. EXAM: NUCLEAR MEDICINE PET SKULL BASE TO THIGH TECHNIQUE: 11.15 mCi F-18 FDG was injected intravenously. Full-ring PET imaging was performed from the skull base to thigh after the radiotracer. CT  data was obtained and used for attenuation correction and anatomic localization. FASTING BLOOD GLUCOSE:  Value: 102 mg/dl COMPARISON:  None. FINDINGS: NECK No hypermetabolic lymph nodes in the neck. CHEST The FDG avid right axillary lymph nodes and epicardial lymph nodes have resolved in the interval. The focal hypermetabolic activity in the left ventricular myocardium described on the previous study is unchanged. No new abnormalities in the chest. ABDOMEN/PELVIS Significant improvement in the abdomen. No definitive FDG avid disease remains in the liver or spleen. The adenopathy in the abdomen has resolved. Minimal uptake in the normal appearing left adrenal gland is of no significance. No abnormal uptake seen in the soft tissues of the pelvis. The omental nodularity seen previously has resolved. There is increased attenuation in the fat associated with the anterior ventral hernia with low level uptake, likely inflammatory. The maximum SUV is measured on image 161 is 3.7. SKELETON There has been significant overall improvement in the bones as well. Uptake in the anterior left acetabulum has significantly diminished with a maximum SUV of 3.4 today versus 29.4 previously. The left  iliac uptake seen previously is no longer significantly FDG avid. There is mild focal uptake in the posterior left iliac bone on series 4, image 166 which was not seen previously and demonstrates a maximum SUV of 4.26 today. No other bony abnormalities are identified. IMPRESSION: 1. Significant interval improvement. No FDG avid disease remains in the soft tissues of the neck, chest, abdomen, or pelvis. Very mild uptake remains in the known left anterior acetabular lesion. Mild new uptake in the posterior left iliac bone is nonspecific but could represent mild involvement. Recommend attention on follow-up. 2. Increased attenuation and mild uptake in the fat of a lower midline ventral hernia is likely inflammatory. Recommend attention on  follow-up. 3. No other interval change. Electronically Signed   By: Dorise Bullion III M.D   On: 10/22/2016 15:33      Discharge Exam: Vitals:   11/04/16 2106 11/05/16 0622  BP: 128/72 116/66  Pulse: 97 80  Resp: 17 18  Temp: 98.2 F (36.8 C) 98.1 F (36.7 C)   Vitals:   11/04/16 1057 11/04/16 1430 11/04/16 2106 11/05/16 0622  BP: 112/62 120/66 128/72 116/66  Pulse: 78 77 97 80  Resp: 20 18 17 18   Temp:  98.5 F (36.9 C) 98.2 F (36.8 C) 98.1 F (36.7 C)  TempSrc:  Oral Oral Oral  SpO2:  99% 98% 98%  Weight:      Height:         General: Pt is alert, awake, not in acute distress Cardiovascular: RRR, S1/S2 +, no rubs, no gallops Respiratory: CTA bilaterally, no wheezing, no rhonchi Abdominal: Soft, NT, ND, bowel sounds + Extremities: no edema, no cyanosis    The results of significant diagnostics from this hospitalization (including imaging, microbiology, ancillary and laboratory) are listed below for reference.     Microbiology: No results found for this or any previous visit (from the past 240 hour(s)).   Labs: BNP (last 3 results)  Recent Labs  09/01/16 2300  BNP 188.4*   Basic Metabolic Panel:  Recent Labs Lab 11/01/16 0701 11/02/16 0544 11/03/16 0503 11/04/16 0339 11/05/16 0434  NA 139 138 138 136 138  K 4.1 4.3 4.3 4.3 4.1  CL 109 108 106 105 108  CO2 23 24 24 23  21*  GLUCOSE 116* 115* 107* 147* 124*  BUN 47* 40* 30* 27* 20  CREATININE 1.70* 1.64* 1.63* 1.82* 1.76*  CALCIUM 8.5* 8.3* 8.5* 8.1* 8.0*   Liver Function Tests:  Recent Labs Lab 10/30/16 1043  AST 27  ALT 32  ALKPHOS 59  BILITOT 0.6  PROT 5.4*  ALBUMIN 3.1*    Recent Labs Lab 10/30/16 1043  LIPASE 37   No results for input(s): AMMONIA in the last 168 hours. CBC:  Recent Labs Lab 10/31/16 0842 11/01/16 0701 11/02/16 0544 11/03/16 0503 11/04/16 0339  WBC 39.0* 27.6* 17.3* 6.0 4.5  NEUTROABS  --  26.7*  --   --  3.2  HGB 8.7* 8.5* 9.1* 9.5* 8.8*  HCT  25.8* 25.5* 28.2* 29.0* 27.0*  MCV 98.5 97.3 98.9 97.0 95.7  PLT 151 130* 116* 103* 91*   Cardiac Enzymes: No results for input(s): CKTOTAL, CKMB, CKMBINDEX, TROPONINI in the last 168 hours. BNP: Invalid input(s): POCBNP CBG: No results for input(s): GLUCAP in the last 168 hours. D-Dimer No results for input(s): DDIMER in the last 72 hours. Hgb A1c No results for input(s): HGBA1C in the last 72 hours. Lipid Profile No results for input(s): CHOL, HDL, LDLCALC, TRIG, CHOLHDL,  LDLDIRECT in the last 72 hours. Thyroid function studies No results for input(s): TSH, T4TOTAL, T3FREE, THYROIDAB in the last 72 hours.  Invalid input(s): FREET3 Anemia work up No results for input(s): VITAMINB12, FOLATE, FERRITIN, TIBC, IRON, RETICCTPCT in the last 72 hours. Urinalysis    Component Value Date/Time   COLORURINE YELLOW 10/30/2016 1710   APPEARANCEUR CLEAR 10/30/2016 1710   LABSPEC 1.015 10/30/2016 1710   PHURINE 5.5 10/30/2016 1710   GLUCOSEU NEGATIVE 10/30/2016 1710   GLUCOSEU NEGATIVE 11/03/2007 1116   HGBUR NEGATIVE 10/30/2016 1710   BILIRUBINUR NEGATIVE 10/30/2016 1710   KETONESUR NEGATIVE 10/30/2016 1710   PROTEINUR NEGATIVE 10/30/2016 1710   UROBILINOGEN 0.2 07/17/2014 1255   NITRITE NEGATIVE 10/30/2016 1710   LEUKOCYTESUR NEGATIVE 10/30/2016 1710   Sepsis Labs Invalid input(s): PROCALCITONIN,  WBC,  LACTICIDVEN Microbiology No results found for this or any previous visit (from the past 240 hour(s)).   Time coordinating discharge: 40 minutes  SIGNED:  Dessa Phi, DO [facility] Pager [phone]  If 7PM-7AM, please contact night-coverage [email or web address] Password [facility] 11/05/2016, 1:54 PM

## 2016-11-05 NOTE — Care Management Important Message (Signed)
Important Message  Patient Details  Name: Luke Mcbride MRN: [id number] Date of Birth: 01-15-1936   Medicare Important Message Given:  Yes    Kerin Salen 11/05/2016, 11:20 AMImportant Message  Patient Details  Name: Luke Mcbride MRN: [id number] Date of Birth: December 17, 1935   Medicare Important Message Given:  Yes    Kerin Salen 11/05/2016, 11:20 AM

## 2016-11-05 NOTE — Discharge Instructions (Signed)
Soft-Food Meal Plan °A soft-food meal plan includes foods that are safe and easy to swallow. This meal plan typically is used: °· If you are having trouble chewing or swallowing foods. °· As a transition meal plan after only having had liquid meals for a long period. ° °What do I need to know about the soft-food meal plan? °A soft-food meal plan includes tender foods that are soft and easy to chew and swallow. In most cases, bite-sized pieces of food are easier to swallow. A bite-sized piece is about ½ inch or smaller. Foods in this plan do not need to be ground or pureed. °Foods that are very hard, crunchy, or sticky should be avoided. Also, breads, cereals, yogurts, and desserts with nuts, seeds, or fruits should be avoided. °What foods can I eat? °Grains °Rice and wild rice. Moist bread, dressing, pasta, and noodles. Well-moistened dry or cooked cereals, such as farina (cooked wheat cereal), oatmeal, or grits. Biscuits, breads, muffins, pancakes, and waffles that have been well moistened. °Vegetables °Shredded lettuce. Cooked, tender vegetables, including potatoes without skins. Vegetable juices. Broths or creamed soups made with vegetables that are not stringy or chewy. Strained tomatoes (without seeds). °Fruits °Canned or well-cooked fruits. Soft (ripe), peeled fresh fruits, such as peaches, nectarines, kiwi, cantaloupe, honeydew melon, and watermelon (without seeds). Soft berries with small seeds, such as strawberries. Fruit juices (without pulp). °Meats and Other Protein Sources °Moist, tender, lean beef. Mutton. Lamb. Veal. Chicken. Turkey. Liver. Ham. Fish without bones. Eggs. °Dairy °Milk, milk drinks, and cream. Plain cream cheese and cottage cheese. Plain yogurt. °Sweets/Desserts °Flavored gelatin desserts. Custard. Plain ice cream, frozen yogurt, sherbet, milk shakes, and malts. Plain cakes and cookies. Plain hard candy. °Other °Butter, margarine (without trans fat), and cooking oils. Mayonnaise. Cream  sauces. Mild spices, salt, and sugar. Syrup, molasses, honey, and jelly. °The items listed above may not be a complete list of recommended foods or beverages. Contact your dietitian for more options. °What foods are not recommended? °Grains °Dry bread, toast, crackers that have not been moistened. Coarse or dry cereals, such as bran, granola, and shredded wheat. Tough or chewy crusty breads, such as French bread or baguettes. °Vegetables °Corn. Raw vegetables except shredded lettuce. Cooked vegetables that are tough or stringy. Tough, crisp, fried potatoes and potato skins. °Fruits °Fresh fruits with skins or seeds or both, such as apples, pears, or grapes. Stringy, high-pulp fruits, such as papaya, pineapple, coconut, or mango. Fruit leather, fruit roll-ups, and all dried fruits. °Meats and Other Protein Sources °Sausages and hot dogs. Meats with gristle. Fish with bones. Nuts, seeds, and chunky peanut or other nut butters. °Sweets/Desserts °Cakes or cookies that are very dry or chewy. °The items listed above may not be a complete list of foods and beverages to avoid. Contact your dietitian for more information. °This information is not intended to replace advice given to you by your health care provider. Make sure you discuss any questions you have with your health care provider. °Document Released: 08/10/2007 Document Revised: 10/09/2015 Document Reviewed: 03/30/2013 °Elsevier Interactive Patient Education © 2017 [employer] ° °

## 2016-11-05 NOTE — Progress Notes (Signed)
CC: abdominal pain  Subjective: He denies any pain and would like to go home if he could.   Objective: Vital signs in last 24 hours: Temp:  [98.1 F (36.7 C)-98.5 F (36.9 C)] 98.1 F (36.7 C) (06/22 0622) Pulse Rate:  [77-97] 80 (06/22 0622) Resp:  [17-20] 18 (06/22 0622) BP: (112-128)/(62-72) 116/66 (06/22 0622) SpO2:  [98 %-99 %] 98 % (06/22 0622) Last BM Date: 11/05/16 1380 PO 1700 IV 950 urine BM x 2 this Am Afebrile, VSS Labs OK, CBC is still pending CT reviewed:  [name] There is been interval increase in volume of pneumoperitoneum when compared with 10/30/2016 presumably secondary to perforated Diverticulitis.  There is a small gas and fluid collection adjacent to the  descending colon at the site of the perforation. Intake/Output from previous day: 06/21 0701 - 06/22 0700 In: 3042.5 [P.O.:1380; I.V.:1512.5; IV Piggyback:150] Out: 950 [Urine:950] Intake/Output this shift: Total I/O In: 120 [P.O.:120] Out: -   General appearance: alert, cooperative and no distress Resp: clear to auscultation bilaterally GI: soft, still somewhat distended.  denies any pain + BM, ready for real food.  Lab Results:   Recent Labs  11/03/16 0503 11/04/16 0339  WBC 6.0 4.5  HGB 9.5* 8.8*  HCT 29.0* 27.0*  PLT 103* 91*    BMET  Recent Labs  11/04/16 0339 11/05/16 0434  NA 136 138  K 4.3 4.1  CL 105 108  CO2 23 21*  GLUCOSE 147* 124*  BUN 27* 20  CREATININE 1.82* 1.76*  CALCIUM 8.1* 8.0*   PT/INR No results for input(s): LABPROT, INR in the last 72 hours.   Recent Labs Lab 10/30/16 1043  AST 27  ALT 32  ALKPHOS 59  BILITOT 0.6  PROT 5.4*  ALBUMIN 3.1*     Lipase     Component Value Date/Time   LIPASE 37 10/30/2016 1043     Medications: . aspirin EC  81 mg Oral Daily  . clopidogrel  75 mg Oral Daily  . finasteride  5 mg Oral QHS  . levothyroxine  100 mcg Oral QAC breakfast  . metoprolol tartrate  12.5 mg Oral BID  . pantoprazole  40 mg Oral  Daily  . sodium chloride flush  10-40 mL Intracatheter Q12H  . tamsulosin  0.4 mg Oral QHS    Assessment/Plan Diverticulitis with perforation  Large B cell lymphoma- chemotherapy 10/28/16 CAD- CABG x 6 05/2000, Stent 2015 Prior MI with large scar left Circ distribution  EF on 06/30/16 - 40% CKI- vasculitis (He was chronically on Imuran until the lymphoma diagnosis) Creatinine - 2.0 - 10/30/2016 (has been running around 1.8) Seen by Dr. Marval Regal Lung disease - Seen by Dr. Lake Bells - 07/01/2016 for preop evaluation FEN- IV fluidsNPO, full liquids VTE- SCDs ID- IV Zosyn 6/16 =>>day 6 =>> convert to Augmentin   Plan:  Advance diet and switch to Augmentin.  We will treat symptomatically.  He will need 3 weeks of antibiotics, and I have placed him on probiotic also.    Will let him follow up with Dr. Irene Limbo who will decide on Chemotherapy.  He will follow up with Dr. Lucia Gaskins after the antibiotics are completed.     LOS: 6 days    Luke Mcbride 11/05/2016 [id number]  Agree with above. Discussed with Dr. Irene Limbo by phone.  Since the lymphoma is more lethal - probably try to stay on chemotx schedule with the known risk of the diverticulitis flaring up again.  He sees Dr. Irene Limbo back  next week. Looks good today.  Alphonsa Overall, MD, [facility] Surgery Pager: [phone] Office phone:  [phone]  \

## 2016-11-05 NOTE — Progress Notes (Signed)
Pt had no complaints of pain and was tolerating his diet with no issues.  D/C instructions were given and understanding was verbalized.

## 2016-11-09 DIAGNOSIS — D0462 Carcinoma in situ of skin of left upper limb, including shoulder: Secondary | ICD-10-CM | POA: Diagnosis not present

## 2016-11-09 DIAGNOSIS — L570 Actinic keratosis: Secondary | ICD-10-CM | POA: Diagnosis not present

## 2016-11-09 DIAGNOSIS — X32XXXD Exposure to sunlight, subsequent encounter: Secondary | ICD-10-CM | POA: Diagnosis not present

## 2016-11-11 ENCOUNTER — Other Ambulatory Visit (HOSPITAL_BASED_OUTPATIENT_CLINIC_OR_DEPARTMENT_OTHER): Payer: Medicare Other

## 2016-11-11 ENCOUNTER — Encounter: Payer: Self-pay | Admitting: Hematology

## 2016-11-11 ENCOUNTER — Ambulatory Visit (HOSPITAL_BASED_OUTPATIENT_CLINIC_OR_DEPARTMENT_OTHER): Payer: Medicare Other | Admitting: Hematology

## 2016-11-11 ENCOUNTER — Ambulatory Visit (HOSPITAL_BASED_OUTPATIENT_CLINIC_OR_DEPARTMENT_OTHER): Payer: Medicare Other

## 2016-11-11 ENCOUNTER — Telehealth: Payer: Self-pay | Admitting: Hematology

## 2016-11-11 VITALS — BP 127/69 | HR 88 | Temp 98.2°F | Resp 20 | Ht 73.0 in | Wt 224.6 lb

## 2016-11-11 DIAGNOSIS — C8338 Diffuse large B-cell lymphoma, lymph nodes of multiple sites: Secondary | ICD-10-CM

## 2016-11-11 DIAGNOSIS — C8330 Diffuse large B-cell lymphoma, unspecified site: Secondary | ICD-10-CM

## 2016-11-11 DIAGNOSIS — Z7189 Other specified counseling: Secondary | ICD-10-CM

## 2016-11-11 DIAGNOSIS — K5720 Diverticulitis of large intestine with perforation and abscess without bleeding: Secondary | ICD-10-CM

## 2016-11-11 DIAGNOSIS — Z95828 Presence of other vascular implants and grafts: Secondary | ICD-10-CM

## 2016-11-11 DIAGNOSIS — D649 Anemia, unspecified: Secondary | ICD-10-CM

## 2016-11-11 LAB — CBC WITH DIFFERENTIAL/PLATELET
BASO%: 0.7 % (ref 0.0–2.0)
Basophils Absolute: 0 10*3/uL (ref 0.0–0.1)
EOS%: 1.8 % (ref 0.0–7.0)
Eosinophils Absolute: 0.1 10*3/uL (ref 0.0–0.5)
HEMATOCRIT: 28 % — AB (ref 38.4–49.9)
HEMOGLOBIN: 9.2 g/dL — AB (ref 13.0–17.1)
LYMPH#: 0.3 10*3/uL — AB (ref 0.9–3.3)
LYMPH%: 4.7 % — ABNORMAL LOW (ref 14.0–49.0)
MCH: 32.8 pg (ref 27.2–33.4)
MCHC: 32.8 g/dL (ref 32.0–36.0)
MCV: 99.9 fL — ABNORMAL HIGH (ref 79.3–98.0)
MONO#: 0.5 10*3/uL (ref 0.1–0.9)
MONO%: 8 % (ref 0.0–14.0)
NEUT%: 84.8 % — ABNORMAL HIGH (ref 39.0–75.0)
NEUTROS ABS: 4.9 10*3/uL (ref 1.5–6.5)
Platelets: 163 10*3/uL (ref 140–400)
RBC: 2.81 10*6/uL — ABNORMAL LOW (ref 4.20–5.82)
RDW: 21.1 % — AB (ref 11.0–14.6)
WBC: 5.8 10*3/uL (ref 4.0–10.3)

## 2016-11-11 LAB — COMPREHENSIVE METABOLIC PANEL
ALBUMIN: 3 g/dL — AB (ref 3.5–5.0)
ALK PHOS: 78 U/L (ref 40–150)
ALT: 20 U/L (ref 0–55)
AST: 12 U/L (ref 5–34)
Anion Gap: 9 mEq/L (ref 3–11)
BUN: 23.2 mg/dL (ref 7.0–26.0)
CALCIUM: 8.9 mg/dL (ref 8.4–10.4)
CO2: 25 mEq/L (ref 22–29)
CREATININE: 1.7 mg/dL — AB (ref 0.7–1.3)
Chloride: 106 mEq/L (ref 98–109)
EGFR: 38 mL/min/{1.73_m2} — ABNORMAL LOW (ref >90)
GLUCOSE: 165 mg/dL — AB (ref 70–140)
Potassium: 4.1 mEq/L (ref 3.5–5.1)
SODIUM: 140 meq/L (ref 136–145)
Total Bilirubin: 0.36 mg/dL (ref 0.20–1.20)
Total Protein: 6.1 g/dL — ABNORMAL LOW (ref 6.4–8.3)

## 2016-11-11 MED ORDER — SODIUM CHLORIDE 0.9% FLUSH
10.0000 mL | Freq: Once | INTRAVENOUS | Status: AC
  Administered 2016-11-11: 10 mL
  Filled 2016-11-11: qty 10

## 2016-11-11 NOTE — Telephone Encounter (Signed)
Scheduled appt per 6/28 los - Gave patient AVS and calender per los.  

## 2016-11-11 NOTE — Patient Instructions (Signed)
Thank you for choosing [facility] to provide your oncology and hematology care.  To afford each patient quality time with our providers, please arrive 30 minutes before your scheduled appointment time.  If you arrive late for your appointment, you may be asked to reschedule.  We strive to give you quality time with our providers, and arriving late affects you and other patients whose appointments are after yours.  If you are a no show for multiple scheduled visits, you may be dismissed from the clinic at the providers discretion.   Again, thank you for choosing [facility], our hope is that these requests will decrease the amount of time that you wait before being seen by our physicians.  ______________________________________________________________________ Should you have questions after your visit to the [facility], please contact our office at [phone] between the hours of 8:30 and 4:30 p.m.    Voicemails left after 4:30p.m will not be returned until the following business day.   For prescription refill requests, please have your pharmacy contact us directly.  Please also try to allow 48 hours for prescription requests.   Please contact the scheduling department for questions regarding scheduling.  For scheduling of procedures such as PET scans, CT scans, MRI, Ultrasound, etc please contact central scheduling at [phone].   Resources For Cancer Patients and Caregivers:  American Cancer Society:  800-227-2345  Can help patients locate various types of support and financial assistance Cancer Care: 1-800-813-HOPE (4673) Provides financial assistance, online support groups, medication/co-pay assistance.   [place] DSS:  336-641-3447 Where to apply for food stamps, Medicaid, and utility assistance Medicare Rights Center: 800-333-4114 Helps people with Medicare understand their rights and benefits, navigate the Medicare system, and secure the  quality healthcare they deserve SCAT: 336-333-6589 [place] [employer]'s shared-ride transportation service for eligible riders who have a disability that prevents them from riding the fixed route bus.   For additional information on assistance programs please contact our social worker:   Grier Hock/Abigail Elmore:  [phone] 

## 2016-11-15 NOTE — Progress Notes (Signed)
HEMATOLOGY/ONCOLOGY CLINIC NOTE  Date of Service: .11/11/2016  Patient Care Team: Donato Heinz, MD as PCP - General (Nephrology) Elsie Stain, MD as Consulting Physician (Pulmonary Disease) Brunetta Genera, MD as Consulting Physician (Hematology) [name], MD as Consulting Physician (Cardiology)  CHIEF COMPLAINTS/PURPOSE OF CONSULTATION:  Diffuse large B cell lymphoma of the small intestine  HISTORY OF PRESENTING ILLNESS:   DEMARRI Mcbride is a wonderful 81 y.o. male who has been referred to [place] by Dr .Donato Heinz, MD and Mauricio Po MD for evaluation and management of newly diagnosed diffuse large B-cell lymphoma of the small bowel.  Patient has a history of multiple medical comorbidities as noted below including coronary artery disease status post PCI and CABG, chronic kidney disease stage III, Barrett's esophagus, hypertension, dyslipidemia, interstitial lung disease, GERD, vasculitis involving the lungs and kidneys on chronic Imuran therapy.  He was having upper abdominal discomfort for a few months with loss of about 15-20 pounds. He had a CT scan of the abdomen done by Dr. Havery Moros on 06/15/2016 which showed a 3.3 x 3.2 x 2.4 cm indeterminate lesion involving the left upper quadrant small bowel loops. No additional small bowel lesions noted. No ascites. No retroperitoneal mesenteric or pelvic lymphadenopathy noted.  Patient was seen by Dr. Serita Grammes and underwent a diagnostic laparoscopy with small bowel resection on 07/12/2016. Pathology showed that the lesion was consistent with diffuse large B-cell lymphoma 2 cm in length and 4 cm in width centrally ulcerated mass involving 100% of the bowel circumference. The tumor was about 1 cm thick invading through the entire wall and into the underlying mesenteric fat. The tumor also abuts and focally involve the serosa. No extramural satellite lymph nodes noted. Did not seem to involve the  peri-intestinal lymph nodes. Tumor was noted to have a germinal center phenotype.  Patient is healing well from surgery. Continues to be on Imuran 100 mg by mouth daily. Has not reported any other peripheral enlarged lymph nodes. He is starting to eat better.   INTERVAL HISTORY  Patient is here for follow-up prior to his 5th cycle of R-CEOP ctx for DLBCL. He was recently hospitalized for acute sigmoid diverticulitis with a small perforation. He was treatment with IVF, bowel rest and antibiotics which he is still on to complete a total of 4 weeks per hospital discharge instruction. He was started back on Po intake and has progressed to regular diet with soft foods. No abdominal pain, fevers or chills no rectal pain or tenesmus. No other symptoms from his diverticular perforation. I had disucssed his case with the surgeon while hospitalized and he was okay with [place] continuing the ctx if the patient is doing well and has no ongoing symptoms. Patient is keen to continue his treatment since he has had good response to treatment thus far and is aware of symptoms he is to monitor for.  MEDICAL HISTORY:  Past Medical History:  Diagnosis Date  . ALLERGIC RHINITIS   . Anemia    hx  . Barrett's esophagus   . BOOP (bronchiolitis obliterans with organizing pneumonia) ([place])    a. s/p R VATS 2008.  [name] CAD (coronary artery disease)    a. 05/2000: NSTEMI/CABG x 6: LIMA->LAD, VG->D1, VG->OM1->2, VG->PDA->RPL;  b. 07/2007 MV: high lat infarct, no ischemia, EF 47%;  c. Cath/PCI: LM nl, LAD20p, 44m, D1 nl, D2 60-70ost, D3 nl, LCX 70ost, 162m, OM1/OM2 min irregs, RCA 30 diff, PDA 99, LIMA->LAD atretic, VG->D1 100,  VG->OM1->2 100, VG->PDA->RPL 90p (4.0x23 Vision BMS);  c. 07/2012 Echo: EF 55%, gr1 DD.  [name] Cataract   . CKD (chronic kidney disease), stage III    a. renal bx 2008: GLN with vasculitis  . Diffuse large B cell lymphoma ([place]) 08/07/2016  . GERD (gastroesophageal reflux disease)   . Glaucoma    a. Cannot  see out of L eye.  . Hematuria    Microscopic  . Hyperglycemia    Patient reported while on prednisone, had to take insulin  . Hyperlipemia   . Hypertension   . Hypothyroidism   . ILD (interstitial lung disease) ([place])   . Iritis   . Local infection of skin and subcutaneous tissue   . Membranoproliferative nephritis   . Myocardial infarction ([place]) 2002  . Neutropenia, drug-induced ([place])   . Recurrent boils   . Residual foreign body in soft tissue   . Vasculitis ([place])    a.  pauciimmune vasculitis with renal involvement and hx of transient hemoptysis in the past with associated BOOP, 2008 (renal bx 2008: GLN with vasculitis). b. History of treatment with 2 cycles of Cytoxan and pheresis. H/o hemoptysis and pulm hemorrhage with 2nd cycle of cytoxan.    SURGICAL HISTORY: Past Surgical History:  Procedure Laterality Date  . BOWEL RESECTION N/A 07/12/2016   Procedure: SMALL BOWEL RESECTION;  Surgeon: Rolm Bookbinder, MD;  Location: [facility];  Service: General;  Laterality: N/A;  . CATARACT EXTRACTION Bilateral   . CORONARY ARTERY BYPASS GRAFT  2002  . IR FLUORO GUIDE PORT INSERTION RIGHT  08/20/2016  . IR US GUIDE VASC ACCESS RIGHT  08/20/2016  . LAPAROSCOPY N/A 07/12/2016   Procedure: LAPAROSCOPY DIAGNOSTIC;  Surgeon: Rolm Bookbinder, MD;  Location: [facility];  Service: General;  Laterality: N/A;  . LEFT HEART CATHETERIZATION WITH CORONARY/GRAFT ANGIOGRAM N/A 07/26/2012   Procedure: LEFT HEART CATHETERIZATION WITH Beatrix Fetters;  Surgeon: Wellington Hampshire, MD;  Location: [facility] CATH [facility];  Service: Cardiovascular;  Laterality: N/A;  . LUNG BIOPSY    . PERCUTANEOUS CORONARY STENT INTERVENTION (PCI-S)  07/26/2012   Procedure: PERCUTANEOUS CORONARY STENT INTERVENTION (PCI-S);  Surgeon: Wellington Hampshire, MD;  Location: [facility] C[facility];  Service: Cardiovascular;;  . RENAL BIOPSY    . TOE AMPUTATION  2009   hammer toe    SOCIAL HISTORY: Social History   Social History  . Marital status:  Married    Spouse name: N/A  . Number of children: N/A  . Years of education: N/A   Occupational History  . [place] History Main Topics  . Smoking status: Never Smoker  . Smokeless tobacco: Never Used  . Alcohol use No  . Drug use: No  . Sexual activity: Not on file   Other Topics Concern  . Not on file   Social History Narrative   Regular exercise - yes      [facility] (07/12/16):   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  no pets currently. No bird or mold exposure.    FAMILY HISTORY: Family History  Problem Relation Age of Onset  . Heart disease Mother   . Diabetes Mother   . Prostate cancer Father   . Depression Other   . Diabetes Other   . Prostate cancer Other   . Colon polyps Neg Hx   . Colon cancer Neg Hx   . Rectal cancer Neg Hx   . Stomach cancer Neg Hx   . Esophageal  cancer Neg Hx     ALLERGIES:  is allergic to no known allergies.  MEDICATIONS:  Current Outpatient Prescriptions  Medication Sig Dispense Refill  . acetaminophen (TYLENOL) 500 MG tablet Take 500-1,000 mg by mouth daily as needed for moderate pain or headache.    [name] amoxicillin-clavulanate (AUGMENTIN) 875-125 MG tablet Take 1 tablet by mouth every 12 (twelve) hours. 42 tablet 0  . aspirin EC 81 MG EC tablet Take 1 tablet (81 mg total) by mouth daily. 30 tablet   . clopidogrel (PLAVIX) 75 MG tablet Take 1 tablet (75 mg total) by mouth daily. 90 tablet 2  . finasteride (PROSCAR) 5 MG tablet Take 5 mg by mouth at bedtime.     . furosemide (LASIX) 20 MG tablet Take 20 mg by mouth daily.    [name] levothyroxine (SYNTHROID, LEVOTHROID) 100 MCG tablet Take 100 mcg by mouth daily.      [name] lidocaine-prilocaine (EMLA) cream Apply to affected area once 30 g 3  . metoprolol tartrate (LOPRESSOR) 25 MG tablet TAKE ONE-HALF TABLET BY  MOUTH TWO TIMES DAILY 90 tablet 1  . Multiple Vitamin (MULTIVITAMIN) tablet Take 1 tablet by mouth daily.      [name] NITROSTAT 0.4 MG SL  tablet PLACE 1 TABLET (0.4 MG TOTAL) UNDER THE TONGUE EVERY 5 (FIVE) MINUTES AS NEEDED. MAY REPEAT X3 (Patient taking differently: Place 0.4 mg under the tongue every 5 (five) minutes as needed for chest pain. May repeat x3) 25 tablet 2  . pantoprazole (PROTONIX) 40 MG tablet TAKE 1 TABLET BY MOUTH  DAILY 90 tablet 1  . predniSONE (DELTASONE) 20 MG tablet Take 3 tablets (60 mg total) by mouth daily. Take on days 1-5 of chemotherapy. 15 tablet 6  . saccharomyces boulardii (FLORASTOR) 250 MG capsule Take 1 capsule (250 mg total) by mouth 2 (two) times daily. 42 capsule 0  . Tamsulosin HCl (FLOMAX) 0.4 MG CAPS Take 0.4 mg by mouth at bedtime.     [name] telmisartan (MICARDIS) 40 MG tablet Take 20 mg by mouth daily.     [name] triamcinolone (KENALOG) 0.025 % cream Apply topically 2 (two) times daily. (Patient not taking: Reported on 10/30/2016) 30 g 0   No current facility-administered medications for this visit.    Facility-Administered Medications Ordered in Other Visits  Medication Dose Route Frequency Provider Last Rate Last Dose  . sodium chloride flush (NS) 0.9 % injection 10 mL  10 mL Intracatheter PRN Brunetta Genera, MD   10 mL at 08/24/16 1653  . sodium chloride flush (NS) 0.9 % injection 10 mL  10 mL Intracatheter PRN Brunetta Genera, MD   10 mL at 10/07/16 1555    REVIEW OF SYSTEMS:    10 Point review of Systems was done is negative except as noted above.  PHYSICAL EXAMINATION: ECOG PERFORMANCE STATUS: 2 - Symptomatic, <50% confined to bed  . Vitals:   11/11/16 0934  BP: 127/69  Pulse: 88  Resp: 20  Temp: 98.2 F (36.8 C)   Filed Weights   11/11/16 0934  Weight: 224 lb 9.6 oz (101.9 kg)   .Body mass index is 29.63 kg/m.  GENERAL:alert, in no acute distress and comfortable SKIN: no acute rashes, no significant lesions EYES: conjunctiva are pink and non-injected, sclera anicteric OROPHARYNX: MMM, no exudates, no oropharyngeal erythema or ulceration NECK: supple, no  JVD LYMPH:  no palpable lymphadenopathy in the cervical, axillary or inguinal regions LUNGS: clear to auscultation b/l with normal respiratory effort HEART: regular rate &  rhythm ABDOMEN:  normoactive bowel sounds , non tender, not distended. Healed laparoscopic surgical scars Extremity: 1+ pitting pedal edema improved from last visit PSYCH: alert & oriented x 3 with fluent speech NEURO: no focal motor/sensory deficits  LABORATORY DATA:  I have reviewed the data as listed . CBC Latest Ref Rng & Units 11/11/2016 11/04/2016 11/03/2016  WBC 4.0 - 10.3 10e3/uL 5.8 4.5 6.0  Hemoglobin 13.0 - 17.1 g/dL 9.2(L) 8.8(L) 9.5(L)  Hematocrit 38.4 - 49.9 % 28.0(L) 27.0(L) 29.0(L)  Platelets 140 - 400 10e3/uL 163 91(L) 103(L)   . CMP Latest Ref Rng & Units 11/11/2016 11/05/2016 11/04/2016  Glucose 70 - 140 mg/dl 165(H) 124(H) 147(H)  BUN 7.0 - 26.0 mg/dL 23.2 20 27(H)  Creatinine 0.7 - 1.3 mg/dL 1.7(H) 1.76(H) 1.82(H)  Sodium 136 - 145 mEq/L 140 138 136  Potassium 3.5 - 5.1 mEq/L 4.1 4.1 4.3  Chloride 101 - 111 mmol/L - 108 105  CO2 22 - 29 mEq/L 25 21(L) 23  Calcium 8.4 - 10.4 mg/dL 8.9 8.0(L) 8.1(L)  Total Protein 6.4 - 8.3 g/dL 6.1(L) - -  Total Bilirubin 0.20 - 1.20 mg/dL 0.36 - -  Alkaline Phos 40 - 150 U/L 78 - -  AST 5 - 34 U/L 12 - -  ALT 0 - 55 U/L 20 - -    . Lab Results  Component Value Date   LDH 196 10/26/2016     RADIOGRAPHIC STUDIES: I have personally reviewed the radiological images as listed and agreed with the findings in the report.  Nm Pet Image Restag (ps) Skull Base To Thigh  Result Date: 10/22/2016 CLINICAL DATA:  Subsequent treatment strategy for diffuse large B-cell non-Hodgkin's lymphoma. EXAM: NUCLEAR MEDICINE PET SKULL BASE TO THIGH TECHNIQUE: 11.15 mCi F-18 FDG was injected intravenously. Full-ring PET imaging was performed from the skull base to thigh after the radiotracer. CT data was obtained and used for attenuation correction and anatomic localization.  FASTING BLOOD GLUCOSE:  Value: 102 mg/dl COMPARISON:  None. FINDINGS: NECK No hypermetabolic lymph nodes in the neck. CHEST The FDG avid right axillary lymph nodes and epicardial lymph nodes have resolved in the interval. The focal hypermetabolic activity in the left ventricular myocardium described on the previous study is unchanged. No new abnormalities in the chest. ABDOMEN/PELVIS Significant improvement in the abdomen. No definitive FDG avid disease remains in the liver or spleen. The adenopathy in the abdomen has resolved. Minimal uptake in the normal appearing left adrenal gland is of no significance. No abnormal uptake seen in the soft tissues of the pelvis. The omental nodularity seen previously has resolved. There is increased attenuation in the fat associated with the anterior ventral hernia with low level uptake, likely inflammatory. The maximum SUV is measured on image 161 is 3.7. SKELETON There has been significant overall improvement in the bones as well. Uptake in the anterior left acetabulum has significantly diminished with a maximum SUV of 3.4 today versus 29.4 previously. The left iliac uptake seen previously is no longer significantly FDG avid. There is mild focal uptake in the posterior left iliac bone on series 4, image 166 which was not seen previously and demonstrates a maximum SUV of 4.26 today. No other bony abnormalities are identified. IMPRESSION: 1. Significant interval improvement. No FDG avid disease remains in the soft tissues of the neck, chest, abdomen, or pelvis. Very mild uptake remains in the known left anterior acetabular lesion. Mild new uptake in the posterior left iliac bone is nonspecific but could represent  mild involvement. Recommend attention on follow-up. 2. Increased attenuation and mild uptake in the fat of a lower midline ventral hernia is likely inflammatory. Recommend attention on follow-up. 3. No other interval change. Electronically Signed   By: Dorise Bullion  III M.D   On: 10/22/2016 15:33   PET/CT 08/06/2016: IMPRESSION: 1. In addition to hypermetabolic nodal involvement of the right axilla, pericardial space, porta hepatis/mesenteric root, and retroperitoneum, there is extensive involvement of the liver as well as peritoneal involvement along the liver surface, splenic surface, and upper omentum. 2. Hypermetabolic bony lesions in the left pelvis compatible with malignant involvement. 3. There is a focus of hypermetabolic activity along the anterolateral wall of the left ventricle. Normally such findings turn out to simply be hypermetabolic myocardium. Strictly speaking given how focal this appears I cannot completely exclude the possibility of tumor involvement within or along the myocardium.   Electronically Signed   By: Van Clines M.D.   On: 08/06/2016 12:48      ASSESSMENT & PLAN:   81 yo male with multiple medical co-morbidities including cardiac co-morbidities, IPF, vascular on chronic immunosupression with   1)Stage IV Diffuse large B-cell lymphoma involving the small intestine (germinal cell phenotype) with complete thickness involvement of bowel and abutting serosa. Patient on labs today has elevated LDH of 305 and hypercalcemia concerning for possible presence of more extensive DLBCL  PET/CT scan shows extensive involvement with DLBCL, hypermetabolic nodal involvement of the right axilla, pericardial space, porta hepatis/mesenteric root, and retroperitoneum, there is extensive involvement of the liver as well as peritoneal involvement along the liver surface, splenic surface, and upper omentum. 2. Hypermetabolic bony lesions in the left pelvis compatible with malignant involvement. 3. There is a focus of hypermetabolic activity along the anterolateral wall of the left ventricle.   s/p 4 cycles of R-CEOP . Tolerated second and third cycle of treatment better with dose reductions .  PET/CT scan on 10/22/2016 after 3  cycles of chemotherapy shows excellent response to treatment.  -   A)Status post neutropenic fever and E.coli/pseudomonal bacteremia/HCAP after the first cycle of treatment.  Significant chemotherapy related thrombocytopenia - now resolved. B) Recent hospitalization for perforation diverticulitis - symptoms completely resolved. Still on abx. Tolerating po soft foods.  2) chronic kidney disease stage III baseline creatinine about 2 3) coronary disease status post PCI and CABG - had a myocardial perfusion imaging scan on 06/30/2016 that showed a moderately decreased ejection fraction of 30-44%- this precludes anthracycline use currently. 4) history of pulmonary and renal vasculitis on chronic Imuran. Now off Imuran considering chemotherapy and Rituxan use.  5) Neutropenia -resolved 6) thrombocytopenia due to chemotherapy -resolved  Plan -labs are stable and he is doing well and has no residual symptoms from his recent perforated sigmoid diverticulitis -we discussed the pros vs cons of continuing vs delaying chemotherapy and patient to not interrupt treatment since he has responded well thus far. -will proceed with 5th cycles of treatment with unchanged ctx doses and supportive medications. -continue with dose reduced R-CEOP (Etoposide to 25mg /m2 and Cyclophosphamide to 500mg /m2) -G-CSF support with neulasta -patient to continue soft diet and po antibiotics as prescribed from the hospital to complete the course. -he understand that he need to seek immediate attention for any fever/chills/abd pain/nausea/vomiting/hematochezia or other GI bleeding.  Continue treatment as per orders Plz schedule C5 and C6 of treatment. RTC with Dr Irene Limbo with labs on day1 of C6 of treatment   All of the patients questions were answered with  apparent satisfaction. The patient knows to call the clinic with any problems, questions or concerns.  I spent 20 minutes counseling the patient face to face. The total time  spent in the appointment was 25 minutes and more than 50% was on counseling and direct patient cares.    Sullivan Lone MD [place] AAHIVMS [facility] Hematology/Oncology Physician [facility]  (Office):       [phone] (Work cell):  [phone] (Fax):           [phone]

## 2016-11-15 NOTE — Progress Notes (Signed)
HEMATOLOGY/ONCOLOGY CLINIC NOTE  Date of Service: .10/26/2016  Patient Care Team: Donato Heinz, MD as PCP - General (Nephrology) Elsie Stain, MD as Consulting Physician (Pulmonary Disease) Brunetta Genera, MD as Consulting Physician (Hematology) [name], MD as Consulting Physician (Cardiology)  CHIEF COMPLAINTS/PURPOSE OF CONSULTATION:  Diffuse large B cell lymphoma of the small intestine  HISTORY OF PRESENTING ILLNESS:   Luke Mcbride is a wonderful 81 y.o. male who has been referred to [place] by Dr .Donato Heinz, MD and Mauricio Po MD for evaluation and management of newly diagnosed diffuse large B-cell lymphoma of the small bowel.  Patient has a history of multiple medical comorbidities as noted below including coronary artery disease status post PCI and CABG, chronic kidney disease stage III, Barrett's esophagus, hypertension, dyslipidemia, interstitial lung disease, GERD, vasculitis involving the lungs and kidneys on chronic Imuran therapy.  He was having upper abdominal discomfort for a few months with loss of about 15-20 pounds. He had a CT scan of the abdomen done by Dr. Havery Moros on 06/15/2016 which showed a 3.3 x 3.2 x 2.4 cm indeterminate lesion involving the left upper quadrant small bowel loops. No additional small bowel lesions noted. No ascites. No retroperitoneal mesenteric or pelvic lymphadenopathy noted.  Patient was seen by Dr. Serita Grammes and underwent a diagnostic laparoscopy with small bowel resection on 07/12/2016. Pathology showed that the lesion was consistent with diffuse large B-cell lymphoma 2 cm in length and 4 cm in width centrally ulcerated mass involving 100% of the bowel circumference. The tumor was about 1 cm thick invading through the entire wall and into the underlying mesenteric fat. The tumor also abuts and focally involve the serosa. No extramural satellite lymph nodes noted. Did not seem to involve the  peri-intestinal lymph nodes. Tumor was noted to have a germinal center phenotype.  Patient is healing well from surgery. Continues to be on Imuran 100 mg by mouth daily. Has not reported any other peripheral enlarged lymph nodes. He is starting to eat better.   INTERVAL HISTORY  Patient is here for follow-up prior to his 4th cycle of R-CEOP ctx for DLBCL. He notes no acute new symptoms. No fevers or chills. Energy levels have been stable. He had a PET/CT scan on 10/22/2016 which showed significant interval improvement in his DLBCL with no FDG avid disease remains in the soft tissues of the neck, chest, abdomen, or pelvis. Very mild uptake remains in the known left anterior acetabular lesion No abdominal pain. No chest pain or shortness of breath.   MEDICAL HISTORY:  Past Medical History:  Diagnosis Date  . ALLERGIC RHINITIS   . Anemia    hx  . Barrett's esophagus   . BOOP (bronchiolitis obliterans with organizing pneumonia) ([place])    a. s/p R VATS 2008.  [name] CAD (coronary artery disease)    a. 05/2000: NSTEMI/CABG x 6: LIMA->LAD, VG->D1, VG->OM1->2, VG->PDA->RPL;  b. 07/2007 MV: high lat infarct, no ischemia, EF 47%;  c. Cath/PCI: LM nl, LAD20p, 13m D1 nl, D2 60-70ost, D3 nl, LCX 70ost, 1065mOM1/OM2 min irregs, RCA 30 diff, PDA 99, LIMA->LAD atretic, VG->D1 100, VG->OM1->2 100, VG->PDA->RPL 90p (4.0x23 Vision BMS);  c. 07/2012 Echo: EF 55%, gr1 DD.  . Marland Kitchenataract   . CKD (chronic kidney disease), stage III    a. renal bx 2008: GLN with vasculitis  . Diffuse large B cell lymphoma ([facility]/[place]  . GERD (gastroesophageal reflux disease)   . Glaucoma  a. Cannot see out of L eye.  . Hematuria    Microscopic  . Hyperglycemia    Patient reported while on prednisone, had to take insulin  . Hyperlipemia   . Hypertension   . Hypothyroidism   . ILD (interstitial lung disease) ([place])   . Iritis   . Local infection of skin and subcutaneous tissue   . Membranoproliferative nephritis   .  Myocardial infarction ([place]) 2002  . Neutropenia, drug-induced ([place])   . Recurrent boils   . Residual foreign body in soft tissue   . Vasculitis ([place])    a.  pauciimmune vasculitis with renal involvement and hx of transient hemoptysis in the past with associated BOOP, 2008 (renal bx 2008: GLN with vasculitis). b. History of treatment with 2 cycles of Cytoxan and pheresis. H/o hemoptysis and pulm hemorrhage with 2nd cycle of cytoxan.    SURGICAL HISTORY: Past Surgical History:  Procedure Laterality Date  . BOWEL RESECTION N/A 07/12/2016   Procedure: SMALL BOWEL RESECTION;  Surgeon: Rolm Bookbinder, MD;  Location: [facility];  Service: General;  Laterality: N/A;  . CATARACT EXTRACTION Bilateral   . CORONARY ARTERY BYPASS GRAFT  2002  . IR FLUORO GUIDE PORT INSERTION RIGHT  08/20/2016  . IR US GUIDE VASC ACCESS RIGHT  08/20/2016  . LAPAROSCOPY N/A 07/12/2016   Procedure: LAPAROSCOPY DIAGNOSTIC;  Surgeon: Rolm Bookbinder, MD;  Location: [facility];  Service: General;  Laterality: N/A;  . LEFT HEART CATHETERIZATION WITH CORONARY/GRAFT ANGIOGRAM N/A 07/26/2012   Procedure: LEFT HEART CATHETERIZATION WITH Beatrix Fetters;  Surgeon: Wellington Hampshire, MD;  Location: [facility] CATH LAB;  Service: Cardiovascular;  Laterality: N/A;  . LUNG BIOPSY    . PERCUTANEOUS CORONARY STENT INTERVENTION (PCI-S)  07/26/2012   Procedure: PERCUTANEOUS CORONARY STENT INTERVENTION (PCI-S);  Surgeon: Wellington Hampshire, MD;  Location: [facility] CATH LAB;  Service: Cardiovascular;;  . RENAL BIOPSY    . TOE AMPUTATION  2009   hammer toe    SOCIAL HISTORY: Social History   Social History  . Marital status: Married    Spouse name: N/A  . Number of children: N/A  . Years of education: N/A   Occupational History  . [place] History Main Topics  . Smoking status: Never Smoker  . Smokeless tobacco: Never Used  . Alcohol use No  . Drug use: No  . Sexual activity: Not on file   Other Topics Concern  . Not on file    Social History Narrative   Regular exercise - yes      [facility] (07/12/16):   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  no pets currently. No bird or mold exposure.    FAMILY HISTORY: Family History  Problem Relation Age of Onset  . Heart disease Mother   . Diabetes Mother   . Prostate cancer Father   . Depression Other   . Diabetes Other   . Prostate cancer Other   . Colon polyps Neg Hx   . Colon cancer Neg Hx   . Rectal cancer Neg Hx   . Stomach cancer Neg Hx   . Esophageal cancer Neg Hx     ALLERGIES:  is allergic to no known allergies.  MEDICATIONS:  Current Outpatient Prescriptions  Medication Sig Dispense Refill  . acetaminophen (TYLENOL) 500 MG tablet Take 500-1,000 mg by mouth daily as needed for moderate pain or headache.    [name] amoxicillin-clavulanate (AUGMENTIN) 875-125 MG tablet Take 1 tablet by  mouth every 12 (twelve) hours. 42 tablet 0  . aspirin EC 81 MG EC tablet Take 1 tablet (81 mg total) by mouth daily. 30 tablet   . clopidogrel (PLAVIX) 75 MG tablet Take 1 tablet (75 mg total) by mouth daily. 90 tablet 2  . finasteride (PROSCAR) 5 MG tablet Take 5 mg by mouth at bedtime.     . furosemide (LASIX) 20 MG tablet Take 20 mg by mouth daily.    [name] levothyroxine (SYNTHROID, LEVOTHROID) 100 MCG tablet Take 100 mcg by mouth daily.      [name] lidocaine-prilocaine (EMLA) cream Apply to affected area once 30 g 3  . metoprolol tartrate (LOPRESSOR) 25 MG tablet TAKE ONE-HALF TABLET BY  MOUTH TWO TIMES DAILY 90 tablet 1  . Multiple Vitamin (MULTIVITAMIN) tablet Take 1 tablet by mouth daily.      [name] NITROSTAT 0.4 MG SL tablet PLACE 1 TABLET (0.4 MG TOTAL) UNDER THE TONGUE EVERY 5 (FIVE) MINUTES AS NEEDED. MAY REPEAT X3 (Patient taking differently: Place 0.4 mg under the tongue every 5 (five) minutes as needed for chest pain. May repeat x3) 25 tablet 2  . pantoprazole (PROTONIX) 40 MG tablet TAKE 1 TABLET BY MOUTH  DAILY 90 tablet 1  . predniSONE  (DELTASONE) 20 MG tablet Take 3 tablets (60 mg total) by mouth daily. Take on days 1-5 of chemotherapy. 15 tablet 6  . saccharomyces boulardii (FLORASTOR) 250 MG capsule Take 1 capsule (250 mg total) by mouth 2 (two) times daily. 42 capsule 0  . Tamsulosin HCl (FLOMAX) 0.4 MG CAPS Take 0.4 mg by mouth at bedtime.     [name] telmisartan (MICARDIS) 40 MG tablet Take 20 mg by mouth daily.     [name] triamcinolone (KENALOG) 0.025 % cream Apply topically 2 (two) times daily. (Patient not taking: Reported on 10/30/2016) 30 g 0   No current facility-administered medications for this visit.    Facility-Administered Medications Ordered in Other Visits  Medication Dose Route Frequency Provider Last Rate Last Dose  . sodium chloride flush (NS) 0.9 % injection 10 mL  10 mL Intracatheter PRN Brunetta Genera, MD   10 mL at 08/24/16 1653  . sodium chloride flush (NS) 0.9 % injection 10 mL  10 mL Intracatheter PRN Brunetta Genera, MD   10 mL at 10/07/16 1555    REVIEW OF SYSTEMS:    10 Point review of Systems was done is negative except as noted above.  PHYSICAL EXAMINATION: ECOG PERFORMANCE STATUS: 2 - Symptomatic, <50% confined to bed  . Vitals:   10/26/16 1131  BP: (!) 148/79  Pulse: 96  Resp: 18  Temp: 98.4 F (36.9 C)   Filed Weights   10/26/16 1131  Weight: 227 lb 12.8 oz (103.3 kg)   .Body mass index is 30.9 kg/m.  GENERAL:alert, in no acute distress and comfortable SKIN: no acute rashes, no significant lesions EYES: conjunctiva are pink and non-injected, sclera anicteric OROPHARYNX: MMM, no exudates, no oropharyngeal erythema or ulceration NECK: supple, no JVD LYMPH:  no palpable lymphadenopathy in the cervical, axillary or inguinal regions LUNGS: clear to auscultation b/l with normal respiratory effort HEART: regular rate & rhythm ABDOMEN:  normoactive bowel sounds , non tender, not distended. Healed laparoscopic surgical scars Extremity: 1+ pitting pedal edema improved from  last visit PSYCH: alert & oriented x 3 with fluent speech NEURO: no focal motor/sensory deficits  LABORATORY DATA:  I have reviewed the data as listed  Component     Latest Ref  Rng & Units 10/26/2016  WBC     4.0 - 10.3 10e3/uL 10.1  NEUT#     1.5 - 6.5 10e3/uL 9.4 (H)  Hemoglobin     13.0 - 17.1 g/dL 28.0 (L)  HCT     33.0 - 49.9 % 31.7 (L)  Platelets     140 - 400 10e3/uL 189  MCV     79.3 - 98.0 fL 101.0 (H)  MCH     27.2 - 33.4 pg 32.2  MCHC     32.0 - 36.0 g/dL 36.4 (L)  RBC     1.59 - 5.82 10e6/uL 3.14 (L)  RDW     11.0 - 14.6 % 20.0 (H)  lymph#     0.9 - 3.3 10e3/uL 0.5 (L)  MONO#     0.1 - 0.9 10e3/uL 0.2  Eosinophils Absolute     0.0 - 0.5 10e3/uL 0.0  Basophils Absolute     0.0 - 0.1 10e3/uL 0.0  NEUT%     39.0 - 75.0 % 93.0 (H)  LYMPH%     14.0 - 49.0 % 4.8 (L)  MONO%     0.0 - 14.0 % 1.7  EOS%     0.0 - 7.0 % 0.3  BASO%     0.0 - 2.0 % 0.2  Retic %     0.80 - 1.80 % 3.52 (H)  Retic Ct Abs     34.80 - 93.90 10e3/uL 110.53 (H)  Immature Retic Fract     3.00 - 10.60 % 20.50 (H)  Sodium     136 - 145 mEq/L 136  Potassium     3.5 - 5.1 mEq/L 4.5  Chloride     98 - 109 mEq/L 107  CO2     22 - 29 mEq/L 23  Glucose     70 - 140 mg/dl 801  BUN     7.0 - 86.0 mg/dL 72.8 (H)  Creatinine     0.7 - 1.3 mg/dL 1.9 (H)  Total Bilirubin     0.20 - 1.20 mg/dL 0.79  Alkaline Phosphatase     40 - 150 U/L 83  AST     5 - 34 U/L 15  ALT     0 - 55 U/L 18  Total Protein     6.4 - 8.3 g/dL 6.5  Albumin     3.5 - 5.0 g/dL 3.2 (L)  Calcium     8.4 - 10.4 mg/dL 9.1  Anion gap     3 - 11 mEq/L 6  EGFR     >90 ml/min/1.73 m2 32 (L)   . Lab Results  Component Value Date   LDH 196 10/26/2016   Myocardial perfusion scan 06/30/2016 Study Highlights     The left ventricular ejection fraction is moderately decreased (30-44%).  Nuclear stress EF: 40%.  There was no ST segment deviation noted during stress.  No T wave inversion was noted  during stress.  Defect 1: There is a large defect of severe severity present in the basal inferolateral, basal anterolateral, mid inferolateral and mid anterolateral location.  Findings consistent with prior myocardial infarction.  This is an intermediate risk study.   Intermediate risk stress nuclear study with a large scar in the left circumflex coronary artery distribution moderate reduction in left ventricular systolic function.   RADIOGRAPHIC STUDIES: I have personally reviewed the radiological images as listed and agreed with the findings in the report.  Nm Pet Image Restag (ps) Skull  Base To Thigh  Result Date: 10/22/2016 CLINICAL DATA:  Subsequent treatment strategy for diffuse large B-cell non-Hodgkin's lymphoma. EXAM: NUCLEAR MEDICINE PET SKULL BASE TO THIGH TECHNIQUE: 11.15 mCi F-18 FDG was injected intravenously. Full-ring PET imaging was performed from the skull base to thigh after the radiotracer. CT data was obtained and used for attenuation correction and anatomic localization. FASTING BLOOD GLUCOSE:  Value: 102 mg/dl COMPARISON:  None. FINDINGS: NECK No hypermetabolic lymph nodes in the neck. CHEST The FDG avid right axillary lymph nodes and epicardial lymph nodes have resolved in the interval. The focal hypermetabolic activity in the left ventricular myocardium described on the previous study is unchanged. No new abnormalities in the chest. ABDOMEN/PELVIS Significant improvement in the abdomen. No definitive FDG avid disease remains in the liver or spleen. The adenopathy in the abdomen has resolved. Minimal uptake in the normal appearing left adrenal gland is of no significance. No abnormal uptake seen in the soft tissues of the pelvis. The omental nodularity seen previously has resolved. There is increased attenuation in the fat associated with the anterior ventral hernia with low level uptake, likely inflammatory. The maximum SUV is measured on image 161 is 3.7. SKELETON There has  been significant overall improvement in the bones as well. Uptake in the anterior left acetabulum has significantly diminished with a maximum SUV of 3.4 today versus 29.4 previously. The left iliac uptake seen previously is no longer significantly FDG avid. There is mild focal uptake in the posterior left iliac bone on series 4, image 166 which was not seen previously and demonstrates a maximum SUV of 4.26 today. No other bony abnormalities are identified. IMPRESSION: 1. Significant interval improvement. No FDG avid disease remains in the soft tissues of the neck, chest, abdomen, or pelvis. Very mild uptake remains in the known left anterior acetabular lesion. Mild new uptake in the posterior left iliac bone is nonspecific but could represent mild involvement. Recommend attention on follow-up. 2. Increased attenuation and mild uptake in the fat of a lower midline ventral hernia is likely inflammatory. Recommend attention on follow-up. 3. No other interval change. Electronically Signed   By: Dorise Bullion III M.D   On: 10/22/2016 15:33   PET/CT 08/06/2016: IMPRESSION: 1. In addition to hypermetabolic nodal involvement of the right axilla, pericardial space, porta hepatis/mesenteric root, and retroperitoneum, there is extensive involvement of the liver as well as peritoneal involvement along the liver surface, splenic surface, and upper omentum. 2. Hypermetabolic bony lesions in the left pelvis compatible with malignant involvement. 3. There is a focus of hypermetabolic activity along the anterolateral wall of the left ventricle. Normally such findings turn out to simply be hypermetabolic myocardium. Strictly speaking given how focal this appears I cannot completely exclude the possibility of tumor involvement within or along the myocardium.   Electronically Signed   By: Van Clines M.D.   On: 08/06/2016 12:48      ASSESSMENT & PLAN:   81 yo male with multiple medical  co-morbidities including cardiac co-morbidities, IPF, vascular on chronic immunosupression with   1)Stage IV Diffuse large B-cell lymphoma involving the small intestine (germinal cell phenotype) with complete thickness involvement of bowel and abutting serosa. Patient on labs today has elevated LDH of 305 and hypercalcemia concerning for possible presence of more extensive DLBCL  PET/CT scan shows extensive involvement with DLBCL, hypermetabolic nodal involvement of the right axilla, pericardial space, porta hepatis/mesenteric root, and retroperitoneum, there is extensive involvement of the liver as well as peritoneal involvement along  the liver surface, splenic surface, and upper omentum. 2. Hypermetabolic bony lesions in the left pelvis compatible with malignant involvement. 3. There is a focus of hypermetabolic activity along the anterolateral wall of the left ventricle.   s/p 3 cycles of R-CEOP . Tolerated second and third cycle of treatment better with dose reductions .  PET/CT scan on 10/22/2016 after 3 cycles of chemotherapy shows excellent response to treatment.  - Status post neutropenic fever and E.coli/pseudomonal bacteremia/HCAP after the first cycle of treatment.  Significant chemotherapy related thrombocytopenia - now resolved.  2) chronic kidney disease stage III baseline creatinine about 2 3) coronary disease status post PCI and CABG - had a myocardial perfusion imaging scan on 06/30/2016 that showed a moderately decreased ejection fraction of 30-44%- this precludes anthracycline use currently. 4) history of pulmonary and renal vasculitis on chronic Imuran. Now off Imuran considering chemotherapy and Rituxan use.  5) Neutropenia -resolved 6) thrombocytopenia due to chemotherapy -resolved  Plan -PET/CT results from 10/22/2016 were discussed in details and images were reviewed with the patient and his wife. -labs are stable and he is doing well. -will proceed with 4th cycles of  treatment with unchanged ctx doses and supportive medications. -continue with dose reduced R-CEOP (Etoposide to '25mg'$ /m2 and Cyclophosphamide to '500mg'$ /m2) -G-CSF support with neulasta -hold statins -he will need close f/u with Dr Meredeth Ide for mx of diuretics currently on lasix '20mg'$  with improved pedal edema  Continue treatment R-CEOP as per orders q3weeks RTC with Dr Irene Limbo in 3 weeks with labs  All of the patients questions were answered with apparent satisfaction. The patient knows to call the clinic with any problems, questions or concerns.  I spent 20 minutes counseling the patient face to face. The total time spent in the appointment was 25 minutes and more than 50% was on counseling and direct patient cares.    Sullivan Lone MD [place] AAHIVMS [facility] Hematology/Oncology Physician [facility]  (Office):       [phone] (Work cell):  [phone] (Fax):           [phone]

## 2016-11-16 ENCOUNTER — Ambulatory Visit (HOSPITAL_BASED_OUTPATIENT_CLINIC_OR_DEPARTMENT_OTHER): Payer: Medicare Other

## 2016-11-16 VITALS — BP 143/71 | HR 82 | Temp 97.8°F | Resp 18

## 2016-11-16 DIAGNOSIS — Z7189 Other specified counseling: Secondary | ICD-10-CM

## 2016-11-16 DIAGNOSIS — Z5111 Encounter for antineoplastic chemotherapy: Secondary | ICD-10-CM | POA: Diagnosis not present

## 2016-11-16 DIAGNOSIS — Z5112 Encounter for antineoplastic immunotherapy: Secondary | ICD-10-CM | POA: Diagnosis not present

## 2016-11-16 DIAGNOSIS — C8338 Diffuse large B-cell lymphoma, lymph nodes of multiple sites: Secondary | ICD-10-CM

## 2016-11-16 MED ORDER — VINCRISTINE SULFATE CHEMO INJECTION 1 MG/ML
2.0000 mg | Freq: Once | INTRAVENOUS | Status: AC
  Administered 2016-11-16: 2 mg via INTRAVENOUS
  Filled 2016-11-16: qty 2

## 2016-11-16 MED ORDER — SODIUM CHLORIDE 0.9 % IV SOLN: Freq: Once | INTRAVENOUS | Status: AC

## 2016-11-16 MED ORDER — DIPHENHYDRAMINE HCL 25 MG PO CAPS
ORAL_CAPSULE | ORAL | Status: AC
  Filled 2016-11-16: qty 2

## 2016-11-16 MED ORDER — HEPARIN SOD (PORK) LOCK FLUSH 100 UNIT/ML IV SOLN
500.0000 [IU] | Freq: Once | INTRAVENOUS | Status: AC | PRN
  Administered 2016-11-16: 500 [IU]
  Filled 2016-11-16: qty 5

## 2016-11-16 MED ORDER — PALONOSETRON HCL INJECTION 0.25 MG/5ML
INTRAVENOUS | Status: AC
  Filled 2016-11-16: qty 5

## 2016-11-16 MED ORDER — SODIUM CHLORIDE 0.9% FLUSH
10.0000 mL | INTRAVENOUS | Status: DC | PRN
  Administered 2016-11-16: 10 mL
  Filled 2016-11-16: qty 10

## 2016-11-16 MED ORDER — DIPHENHYDRAMINE HCL 25 MG PO CAPS
50.0000 mg | ORAL_CAPSULE | Freq: Once | ORAL | Status: AC
  Administered 2016-11-16: 50 mg via ORAL

## 2016-11-16 MED ORDER — ACETAMINOPHEN 325 MG PO TABS
650.0000 mg | ORAL_TABLET | Freq: Once | ORAL | Status: AC
  Administered 2016-11-16: 650 mg via ORAL

## 2016-11-16 MED ORDER — ACETAMINOPHEN 325 MG PO TABS
ORAL_TABLET | ORAL | Status: AC
  Filled 2016-11-16: qty 2

## 2016-11-16 MED ORDER — SODIUM CHLORIDE 0.9 % IV SOLN
25.0000 mg/m2 | Freq: Once | INTRAVENOUS | Status: AC
  Administered 2016-11-16: 60 mg via INTRAVENOUS
  Filled 2016-11-16: qty 3

## 2016-11-16 MED ORDER — DEXAMETHASONE SODIUM PHOSPHATE 10 MG/ML IJ SOLN
10.0000 mg | Freq: Once | INTRAMUSCULAR | Status: AC
  Administered 2016-11-16: 10 mg via INTRAVENOUS

## 2016-11-16 MED ORDER — SODIUM CHLORIDE 0.9 % IV SOLN
500.0000 mg/m2 | Freq: Once | INTRAVENOUS | Status: AC
  Administered 2016-11-16: 1120 mg via INTRAVENOUS
  Filled 2016-11-16: qty 56

## 2016-11-16 MED ORDER — SODIUM CHLORIDE 0.9 % IV SOLN
Freq: Once | INTRAVENOUS | Status: AC
  Administered 2016-11-16: 13:00:00 via INTRAVENOUS

## 2016-11-16 MED ORDER — PALONOSETRON HCL INJECTION 0.25 MG/5ML
0.2500 mg | Freq: Once | INTRAVENOUS | Status: AC
  Administered 2016-11-16: 0.25 mg via INTRAVENOUS

## 2016-11-16 MED ORDER — SODIUM CHLORIDE 0.9 % IV SOLN
375.0000 mg/m2 | Freq: Once | INTRAVENOUS | Status: AC
  Administered 2016-11-16: 800 mg via INTRAVENOUS
  Filled 2016-11-16: qty 50

## 2016-11-16 MED ORDER — DEXAMETHASONE SODIUM PHOSPHATE 10 MG/ML IJ SOLN
INTRAMUSCULAR | Status: AC
  Filled 2016-11-16: qty 1

## 2016-11-16 NOTE — Patient Instructions (Signed)
[facility] Discharge Instructions for Patients Receiving Chemotherapy  Today you received the following chemotherapy agents Vincristine/Cytoxan/VP 16/ Rituxan To help prevent nausea and vomiting after your treatment, we encourage you to take your nausea medication as prescribed.  If you develop nausea and vomiting that is not controlled by your nausea medication, call the clinic.   BELOW ARE SYMPTOMS THAT SHOULD BE REPORTED IMMEDIATELY:  *FEVER GREATER THAN 100.5 F  *CHILLS WITH OR WITHOUT FEVER  NAUSEA AND VOMITING THAT IS NOT CONTROLLED WITH YOUR NAUSEA MEDICATION  *UNUSUAL SHORTNESS OF BREATH  *UNUSUAL BRUISING OR BLEEDING  TENDERNESS IN MOUTH AND THROAT WITH OR WITHOUT PRESENCE OF ULCERS  *URINARY PROBLEMS  *BOWEL PROBLEMS  UNUSUAL RASH Items with * indicate a potential emergency and should be followed up as soon as possible.  Feel free to call the clinic you have any questions or concerns. The clinic phone number is [phone].  Please show the [place] at check-in to the Emergency Department and triage nurse.

## 2016-11-16 NOTE — Progress Notes (Signed)
Ok to treat with Creatinine 1.7 per Dr. Irene Limbo  Office visit note from 6/28 and conversation with Arbie Cookey, Pharmacist.

## 2016-11-18 ENCOUNTER — Ambulatory Visit (HOSPITAL_BASED_OUTPATIENT_CLINIC_OR_DEPARTMENT_OTHER): Payer: Medicare Other

## 2016-11-18 VITALS — BP 141/70 | HR 69 | Temp 98.0°F | Resp 18

## 2016-11-18 DIAGNOSIS — Z7189 Other specified counseling: Secondary | ICD-10-CM

## 2016-11-18 DIAGNOSIS — C8338 Diffuse large B-cell lymphoma, lymph nodes of multiple sites: Secondary | ICD-10-CM

## 2016-11-18 DIAGNOSIS — Z5111 Encounter for antineoplastic chemotherapy: Secondary | ICD-10-CM

## 2016-11-18 MED ORDER — DEXAMETHASONE SODIUM PHOSPHATE 10 MG/ML IJ SOLN
10.0000 mg | Freq: Once | INTRAMUSCULAR | Status: AC
  Administered 2016-11-18: 10 mg via INTRAVENOUS

## 2016-11-18 MED ORDER — SODIUM CHLORIDE 0.9% FLUSH
10.0000 mL | INTRAVENOUS | Status: DC | PRN
  Administered 2016-11-18: 10 mL
  Filled 2016-11-18: qty 10

## 2016-11-18 MED ORDER — DEXAMETHASONE SODIUM PHOSPHATE 10 MG/ML IJ SOLN
INTRAMUSCULAR | Status: AC
  Filled 2016-11-18: qty 1

## 2016-11-18 MED ORDER — PROCHLORPERAZINE MALEATE 10 MG PO TABS
ORAL_TABLET | ORAL | Status: AC
  Filled 2016-11-18: qty 1

## 2016-11-18 MED ORDER — PROCHLORPERAZINE MALEATE 10 MG PO TABS
10.0000 mg | ORAL_TABLET | Freq: Once | ORAL | Status: AC
  Administered 2016-11-18: 10 mg via ORAL

## 2016-11-18 MED ORDER — SODIUM CHLORIDE 0.9 % IV SOLN
Freq: Once | INTRAVENOUS | Status: AC
  Administered 2016-11-18: 15:00:00 via INTRAVENOUS

## 2016-11-18 MED ORDER — HEPARIN SOD (PORK) LOCK FLUSH 100 UNIT/ML IV SOLN
500.0000 [IU] | Freq: Once | INTRAVENOUS | Status: AC | PRN
  Administered 2016-11-18: 500 [IU]
  Filled 2016-11-18: qty 5

## 2016-11-18 MED ORDER — ETOPOSIDE CHEMO INJECTION 1 GM/50ML
25.0000 mg/m2 | Freq: Once | INTRAVENOUS | Status: AC
  Administered 2016-11-18: 60 mg via INTRAVENOUS
  Filled 2016-11-18: qty 3

## 2016-11-18 NOTE — Patient Instructions (Signed)
[facility] Discharge Instructions for Patients Receiving Chemotherapy  Today you received the following chemotherapy agents: Etoposide   To help prevent nausea and vomiting after your treatment, we encourage you to take your nausea medication as directed.    If you develop nausea and vomiting that is not controlled by your nausea medication, call the clinic.   BELOW ARE SYMPTOMS THAT SHOULD BE REPORTED IMMEDIATELY:  *FEVER GREATER THAN 100.5 F  *CHILLS WITH OR WITHOUT FEVER  NAUSEA AND VOMITING THAT IS NOT CONTROLLED WITH YOUR NAUSEA MEDICATION  *UNUSUAL SHORTNESS OF BREATH  *UNUSUAL BRUISING OR BLEEDING  TENDERNESS IN MOUTH AND THROAT WITH OR WITHOUT PRESENCE OF ULCERS  *URINARY PROBLEMS  *BOWEL PROBLEMS  UNUSUAL RASH Items with * indicate a potential emergency and should be followed up as soon as possible.  Feel free to call the clinic you have any questions or concerns. The clinic phone number is [phone].  Please show the CHEMO ALERT CARD at check-in to the Emergency Department and triage nurse.   

## 2016-11-19 ENCOUNTER — Ambulatory Visit (HOSPITAL_BASED_OUTPATIENT_CLINIC_OR_DEPARTMENT_OTHER): Payer: Medicare Other

## 2016-11-19 VITALS — BP 140/72 | HR 72 | Temp 98.0°F | Resp 18

## 2016-11-19 DIAGNOSIS — R109 Unspecified abdominal pain: Secondary | ICD-10-CM | POA: Diagnosis not present

## 2016-11-19 DIAGNOSIS — Z5111 Encounter for antineoplastic chemotherapy: Secondary | ICD-10-CM

## 2016-11-19 DIAGNOSIS — N183 Chronic kidney disease, stage 3 (moderate): Secondary | ICD-10-CM | POA: Diagnosis not present

## 2016-11-19 DIAGNOSIS — I2510 Atherosclerotic heart disease of native coronary artery without angina pectoris: Secondary | ICD-10-CM | POA: Diagnosis not present

## 2016-11-19 DIAGNOSIS — R05 Cough: Secondary | ICD-10-CM | POA: Diagnosis not present

## 2016-11-19 DIAGNOSIS — E7800 Pure hypercholesterolemia, unspecified: Secondary | ICD-10-CM | POA: Diagnosis not present

## 2016-11-19 DIAGNOSIS — J42 Unspecified chronic bronchitis: Secondary | ICD-10-CM | POA: Diagnosis not present

## 2016-11-19 DIAGNOSIS — C8338 Diffuse large B-cell lymphoma, lymph nodes of multiple sites: Secondary | ICD-10-CM | POA: Diagnosis not present

## 2016-11-19 DIAGNOSIS — Z7189 Other specified counseling: Secondary | ICD-10-CM

## 2016-11-19 DIAGNOSIS — C8330 Diffuse large B-cell lymphoma, unspecified site: Secondary | ICD-10-CM | POA: Diagnosis not present

## 2016-11-19 DIAGNOSIS — E669 Obesity, unspecified: Secondary | ICD-10-CM | POA: Diagnosis not present

## 2016-11-19 DIAGNOSIS — N013 Rapidly progressive nephritic syndrome with diffuse mesangial proliferative glomerulonephritis: Secondary | ICD-10-CM | POA: Diagnosis not present

## 2016-11-19 DIAGNOSIS — I129 Hypertensive chronic kidney disease with stage 1 through stage 4 chronic kidney disease, or unspecified chronic kidney disease: Secondary | ICD-10-CM | POA: Diagnosis not present

## 2016-11-19 DIAGNOSIS — Z5189 Encounter for other specified aftercare: Secondary | ICD-10-CM

## 2016-11-19 DIAGNOSIS — N400 Enlarged prostate without lower urinary tract symptoms: Secondary | ICD-10-CM | POA: Diagnosis not present

## 2016-11-19 MED ORDER — SODIUM CHLORIDE 0.9 % IV SOLN
Freq: Once | INTRAVENOUS | Status: AC
  Administered 2016-11-19: 14:00:00 via INTRAVENOUS

## 2016-11-19 MED ORDER — DEXAMETHASONE SODIUM PHOSPHATE 10 MG/ML IJ SOLN
INTRAMUSCULAR | Status: AC
  Filled 2016-11-19: qty 1

## 2016-11-19 MED ORDER — SODIUM CHLORIDE 0.9 % IV SOLN
25.0000 mg/m2 | Freq: Once | INTRAVENOUS | Status: AC
  Administered 2016-11-19: 60 mg via INTRAVENOUS
  Filled 2016-11-19: qty 3

## 2016-11-19 MED ORDER — DEXAMETHASONE SODIUM PHOSPHATE 10 MG/ML IJ SOLN
10.0000 mg | Freq: Once | INTRAMUSCULAR | Status: AC
  Administered 2016-11-19: 10 mg via INTRAVENOUS

## 2016-11-19 MED ORDER — PROCHLORPERAZINE MALEATE 10 MG PO TABS
ORAL_TABLET | ORAL | Status: AC
  Filled 2016-11-19: qty 1

## 2016-11-19 MED ORDER — HEPARIN SOD (PORK) LOCK FLUSH 100 UNIT/ML IV SOLN
500.0000 [IU] | Freq: Once | INTRAVENOUS | Status: AC | PRN
  Administered 2016-11-19: 500 [IU]
  Filled 2016-11-19: qty 5

## 2016-11-19 MED ORDER — PEGFILGRASTIM 6 MG/0.6ML ~~LOC~~ PSKT
6.0000 mg | PREFILLED_SYRINGE | Freq: Once | SUBCUTANEOUS | Status: AC
  Administered 2016-11-19: 6 mg via SUBCUTANEOUS
  Filled 2016-11-19: qty 0.6

## 2016-11-19 MED ORDER — SODIUM CHLORIDE 0.9% FLUSH
10.0000 mL | INTRAVENOUS | Status: DC | PRN
  Administered 2016-11-19: 10 mL
  Filled 2016-11-19: qty 10

## 2016-11-19 MED ORDER — PROCHLORPERAZINE MALEATE 10 MG PO TABS
10.0000 mg | ORAL_TABLET | Freq: Once | ORAL | Status: AC
  Administered 2016-11-19: 10 mg via ORAL

## 2016-11-19 NOTE — Patient Instructions (Signed)
[facility] Discharge Instructions for Patients Receiving Chemotherapy  Today you received the following chemotherapy agents: Etoposide   To help prevent nausea and vomiting after your treatment, we encourage you to take your nausea medication as directed.    If you develop nausea and vomiting that is not controlled by your nausea medication, call the clinic.   BELOW ARE SYMPTOMS THAT SHOULD BE REPORTED IMMEDIATELY:  *FEVER GREATER THAN 100.5 F  *CHILLS WITH OR WITHOUT FEVER  NAUSEA AND VOMITING THAT IS NOT CONTROLLED WITH YOUR NAUSEA MEDICATION  *UNUSUAL SHORTNESS OF BREATH  *UNUSUAL BRUISING OR BLEEDING  TENDERNESS IN MOUTH AND THROAT WITH OR WITHOUT PRESENCE OF ULCERS  *URINARY PROBLEMS  *BOWEL PROBLEMS  UNUSUAL RASH Items with * indicate a potential emergency and should be followed up as soon as possible.  Feel free to call the clinic you have any questions or concerns. The clinic phone number is [phone].  Please show the CHEMO ALERT CARD at check-in to the Emergency Department and triage nurse.   

## 2016-11-27 ENCOUNTER — Other Ambulatory Visit: Payer: Self-pay | Admitting: Cardiovascular Disease

## 2016-12-06 DIAGNOSIS — Z85828 Personal history of other malignant neoplasm of skin: Secondary | ICD-10-CM | POA: Diagnosis not present

## 2016-12-06 DIAGNOSIS — Z08 Encounter for follow-up examination after completed treatment for malignant neoplasm: Secondary | ICD-10-CM | POA: Diagnosis not present

## 2016-12-07 ENCOUNTER — Ambulatory Visit (HOSPITAL_BASED_OUTPATIENT_CLINIC_OR_DEPARTMENT_OTHER): Payer: Medicare Other

## 2016-12-07 ENCOUNTER — Other Ambulatory Visit (HOSPITAL_BASED_OUTPATIENT_CLINIC_OR_DEPARTMENT_OTHER): Payer: Medicare Other

## 2016-12-07 VITALS — BP 142/83 | HR 78 | Temp 97.9°F | Resp 18

## 2016-12-07 DIAGNOSIS — C8338 Diffuse large B-cell lymphoma, lymph nodes of multiple sites: Secondary | ICD-10-CM | POA: Diagnosis not present

## 2016-12-07 DIAGNOSIS — Z5112 Encounter for antineoplastic immunotherapy: Secondary | ICD-10-CM | POA: Diagnosis not present

## 2016-12-07 DIAGNOSIS — Z5111 Encounter for antineoplastic chemotherapy: Secondary | ICD-10-CM | POA: Diagnosis not present

## 2016-12-07 DIAGNOSIS — Z7189 Other specified counseling: Secondary | ICD-10-CM

## 2016-12-07 DIAGNOSIS — D649 Anemia, unspecified: Secondary | ICD-10-CM

## 2016-12-07 LAB — COMPREHENSIVE METABOLIC PANEL
ALT: 18 U/L (ref 0–55)
AST: 15 U/L (ref 5–34)
Albumin: 3.2 g/dL — ABNORMAL LOW (ref 3.5–5.0)
Alkaline Phosphatase: 75 U/L (ref 40–150)
Anion Gap: 10 mEq/L (ref 3–11)
BUN: 29.7 mg/dL — AB (ref 7.0–26.0)
CALCIUM: 8.8 mg/dL (ref 8.4–10.4)
CHLORIDE: 108 meq/L (ref 98–109)
CO2: 21 mEq/L — ABNORMAL LOW (ref 22–29)
Creatinine: 1.7 mg/dL — ABNORMAL HIGH (ref 0.7–1.3)
EGFR: 36 mL/min/{1.73_m2} — AB (ref >90)
Glucose: 166 mg/dl — ABNORMAL HIGH (ref 70–140)
POTASSIUM: 4.1 meq/L (ref 3.5–5.1)
Sodium: 139 mEq/L (ref 136–145)
Total Bilirubin: 0.34 mg/dL (ref 0.20–1.20)
Total Protein: 6.2 g/dL — ABNORMAL LOW (ref 6.4–8.3)

## 2016-12-07 LAB — CBC & DIFF AND RETIC
BASO%: 0.2 % (ref 0.0–2.0)
Basophils Absolute: 0 10*3/uL (ref 0.0–0.1)
EOS%: 0.5 % (ref 0.0–7.0)
Eosinophils Absolute: 0 10*3/uL (ref 0.0–0.5)
HCT: 30.5 % — ABNORMAL LOW (ref 38.4–49.9)
HGB: 9.5 g/dL — ABNORMAL LOW (ref 13.0–17.1)
IMMATURE RETIC FRACT: 20.5 % — AB (ref 3.00–10.60)
LYMPH#: 0.3 10*3/uL — AB (ref 0.9–3.3)
LYMPH%: 4 % — ABNORMAL LOW (ref 14.0–49.0)
MCH: 32.8 pg (ref 27.2–33.4)
MCHC: 31.1 g/dL — ABNORMAL LOW (ref 32.0–36.0)
MCV: 105.2 fL — ABNORMAL HIGH (ref 79.3–98.0)
MONO#: 0.3 10*3/uL (ref 0.1–0.9)
MONO%: 4 % (ref 0.0–14.0)
NEUT%: 91.3 % — AB (ref 39.0–75.0)
NEUTROS ABS: 7.5 10*3/uL — AB (ref 1.5–6.5)
Platelets: 191 10*3/uL (ref 140–400)
RBC: 2.9 10*6/uL — AB (ref 4.20–5.82)
RDW: 19.2 % — ABNORMAL HIGH (ref 11.0–14.6)
RETIC %: 4.14 % — AB (ref 0.80–1.80)
RETIC CT ABS: 120.06 10*3/uL — AB (ref 34.80–93.90)
WBC: 8.2 10*3/uL (ref 4.0–10.3)

## 2016-12-07 MED ORDER — ACETAMINOPHEN 325 MG PO TABS
ORAL_TABLET | ORAL | Status: AC
  Filled 2016-12-07: qty 2

## 2016-12-07 MED ORDER — PALONOSETRON HCL INJECTION 0.25 MG/5ML
0.2500 mg | Freq: Once | INTRAVENOUS | Status: AC
Start: 2016-12-07 — End: 2016-12-07
  Administered 2016-12-07: 0.25 mg via INTRAVENOUS

## 2016-12-07 MED ORDER — DIPHENHYDRAMINE HCL 25 MG PO CAPS
50.0000 mg | ORAL_CAPSULE | Freq: Once | ORAL | Status: AC
Start: 2016-12-07 — End: 2016-12-07
  Administered 2016-12-07: 50 mg via ORAL

## 2016-12-07 MED ORDER — SODIUM CHLORIDE 0.9 % IV SOLN
375.0000 mg/m2 | Freq: Once | INTRAVENOUS | Status: AC
  Administered 2016-12-07: 800 mg via INTRAVENOUS
  Filled 2016-12-07: qty 50

## 2016-12-07 MED ORDER — SODIUM CHLORIDE 0.9 % IV SOLN
Freq: Once | INTRAVENOUS | Status: AC
  Administered 2016-12-07: 09:00:00 via INTRAVENOUS

## 2016-12-07 MED ORDER — DEXAMETHASONE SODIUM PHOSPHATE 10 MG/ML IJ SOLN
INTRAMUSCULAR | Status: AC
  Filled 2016-12-07: qty 1

## 2016-12-07 MED ORDER — DIPHENHYDRAMINE HCL 25 MG PO CAPS
ORAL_CAPSULE | ORAL | Status: AC
  Filled 2016-12-07: qty 2

## 2016-12-07 MED ORDER — PALONOSETRON HCL INJECTION 0.25 MG/5ML
INTRAVENOUS | Status: AC
  Filled 2016-12-07: qty 5

## 2016-12-07 MED ORDER — HEPARIN SOD (PORK) LOCK FLUSH 100 UNIT/ML IV SOLN
500.0000 [IU] | Freq: Once | INTRAVENOUS | Status: AC | PRN
  Administered 2016-12-07: 500 [IU]
  Filled 2016-12-07: qty 5

## 2016-12-07 MED ORDER — SODIUM CHLORIDE 0.9% FLUSH
10.0000 mL | INTRAVENOUS | Status: DC | PRN
  Administered 2016-12-07: 10 mL
  Filled 2016-12-07: qty 10

## 2016-12-07 MED ORDER — ACETAMINOPHEN 325 MG PO TABS
650.0000 mg | ORAL_TABLET | Freq: Once | ORAL | Status: AC
  Administered 2016-12-07: 650 mg via ORAL

## 2016-12-07 MED ORDER — SODIUM CHLORIDE 0.9 % IV SOLN
25.0000 mg/m2 | Freq: Once | INTRAVENOUS | Status: AC
  Administered 2016-12-07: 60 mg via INTRAVENOUS
  Filled 2016-12-07: qty 3

## 2016-12-07 MED ORDER — DEXAMETHASONE SODIUM PHOSPHATE 10 MG/ML IJ SOLN
10.0000 mg | Freq: Once | INTRAMUSCULAR | Status: AC
  Administered 2016-12-07: 10 mg via INTRAVENOUS

## 2016-12-07 MED ORDER — SODIUM CHLORIDE 0.9 % IV SOLN
500.0000 mg/m2 | Freq: Once | INTRAVENOUS | Status: AC
  Administered 2016-12-07: 1120 mg via INTRAVENOUS
  Filled 2016-12-07: qty 56

## 2016-12-07 MED ORDER — VINCRISTINE SULFATE CHEMO INJECTION 1 MG/ML
2.0000 mg | Freq: Once | INTRAVENOUS | Status: AC
  Administered 2016-12-07: 2 mg via INTRAVENOUS
  Filled 2016-12-07: qty 2

## 2016-12-07 NOTE — Patient Instructions (Signed)
[facility] Discharge Instructions for Patients Receiving Chemotherapy  Today you received the following chemotherapy agents Vincristine, Cytoxan, Etoposide and Rituxan   To help prevent nausea and vomiting after your treatment, we encourage you to take your nausea medication as directed. No Zofran for 3 days. Take Compazine instead.    If you develop nausea and vomiting that is not controlled by your nausea medication, call the clinic.   BELOW ARE SYMPTOMS THAT SHOULD BE REPORTED IMMEDIATELY:  *FEVER GREATER THAN 100.5 F  *CHILLS WITH OR WITHOUT FEVER  NAUSEA AND VOMITING THAT IS NOT CONTROLLED WITH YOUR NAUSEA MEDICATION  *UNUSUAL SHORTNESS OF BREATH  *UNUSUAL BRUISING OR BLEEDING  TENDERNESS IN MOUTH AND THROAT WITH OR WITHOUT PRESENCE OF ULCERS  *URINARY PROBLEMS  *BOWEL PROBLEMS  UNUSUAL RASH Items with * indicate a potential emergency and should be followed up as soon as possible.  Feel free to call the clinic you have any questions or concerns. The clinic phone number is [phone].  Please show the [place] at check-in to the Emergency Department and triage nurse.

## 2016-12-07 NOTE — Progress Notes (Signed)
Ok to treat with Cr of 1.7 per MD    Wylene Simmer, BSN, RN 12/07/2016 9:37 AM

## 2016-12-08 ENCOUNTER — Ambulatory Visit (HOSPITAL_BASED_OUTPATIENT_CLINIC_OR_DEPARTMENT_OTHER): Payer: Medicare Other

## 2016-12-08 ENCOUNTER — Ambulatory Visit (HOSPITAL_BASED_OUTPATIENT_CLINIC_OR_DEPARTMENT_OTHER): Payer: Medicare Other | Admitting: Hematology

## 2016-12-08 ENCOUNTER — Telehealth: Payer: Self-pay | Admitting: Hematology

## 2016-12-08 VITALS — BP 133/71 | HR 86 | Temp 97.9°F | Resp 18 | Ht 73.0 in | Wt 231.0 lb

## 2016-12-08 DIAGNOSIS — D649 Anemia, unspecified: Secondary | ICD-10-CM | POA: Diagnosis not present

## 2016-12-08 DIAGNOSIS — Z5111 Encounter for antineoplastic chemotherapy: Secondary | ICD-10-CM

## 2016-12-08 DIAGNOSIS — Z95828 Presence of other vascular implants and grafts: Secondary | ICD-10-CM

## 2016-12-08 DIAGNOSIS — C8338 Diffuse large B-cell lymphoma, lymph nodes of multiple sites: Secondary | ICD-10-CM

## 2016-12-08 DIAGNOSIS — C8339 Diffuse large B-cell lymphoma, extranodal and solid organ sites: Secondary | ICD-10-CM

## 2016-12-08 DIAGNOSIS — I2510 Atherosclerotic heart disease of native coronary artery without angina pectoris: Secondary | ICD-10-CM | POA: Diagnosis not present

## 2016-12-08 DIAGNOSIS — N183 Chronic kidney disease, stage 3 (moderate): Secondary | ICD-10-CM | POA: Diagnosis not present

## 2016-12-08 DIAGNOSIS — Z7189 Other specified counseling: Secondary | ICD-10-CM

## 2016-12-08 MED ORDER — SODIUM CHLORIDE 0.9% FLUSH
10.0000 mL | INTRAVENOUS | Status: DC | PRN
  Administered 2016-12-08: 10 mL
  Filled 2016-12-08: qty 10

## 2016-12-08 MED ORDER — PROCHLORPERAZINE MALEATE 10 MG PO TABS
10.0000 mg | ORAL_TABLET | Freq: Once | ORAL | Status: AC
  Administered 2016-12-08: 10 mg via ORAL

## 2016-12-08 MED ORDER — HEPARIN SOD (PORK) LOCK FLUSH 100 UNIT/ML IV SOLN
500.0000 [IU] | Freq: Once | INTRAVENOUS | Status: AC | PRN
  Administered 2016-12-08: 500 [IU]
  Filled 2016-12-08: qty 5

## 2016-12-08 MED ORDER — SODIUM CHLORIDE 0.9 % IV SOLN
25.0000 mg/m2 | Freq: Once | INTRAVENOUS | Status: AC
  Administered 2016-12-08: 60 mg via INTRAVENOUS
  Filled 2016-12-08: qty 3

## 2016-12-08 MED ORDER — DEXAMETHASONE SODIUM PHOSPHATE 10 MG/ML IJ SOLN
INTRAMUSCULAR | Status: AC
  Filled 2016-12-08: qty 1

## 2016-12-08 MED ORDER — SODIUM CHLORIDE 0.9 % IV SOLN
Freq: Once | INTRAVENOUS | Status: AC
  Administered 2016-12-08: 10:00:00 via INTRAVENOUS

## 2016-12-08 MED ORDER — PROCHLORPERAZINE MALEATE 10 MG PO TABS
ORAL_TABLET | ORAL | Status: AC
  Filled 2016-12-08: qty 1

## 2016-12-08 MED ORDER — DEXAMETHASONE SODIUM PHOSPHATE 10 MG/ML IJ SOLN
10.0000 mg | Freq: Once | INTRAMUSCULAR | Status: AC
  Administered 2016-12-08: 10 mg via INTRAVENOUS

## 2016-12-08 NOTE — Progress Notes (Signed)
HEMATOLOGY/ONCOLOGY CLINIC NOTE  Date of Service: .12/08/2016  Patient Care Team: Donato Heinz, MD as PCP - General (Nephrology) Brunetta Genera, MD as Consulting Physician (Hematology) [name], MD as Consulting Physician (Cardiology)  CHIEF COMPLAINTS/PURPOSE OF CONSULTATION:  Diffuse large B cell lymphoma of the small intestine  HISTORY OF PRESENTING ILLNESS:   Luke Mcbride is a wonderful 81 y.o. male who has been referred to [place] by Dr .Donato Heinz, MD and Mauricio Po MD for evaluation and management of newly diagnosed diffuse large B-cell lymphoma of the small bowel.  Patient has a history of multiple medical comorbidities as noted below including coronary artery disease status post PCI and CABG, chronic kidney disease stage III, Barrett's esophagus, hypertension, dyslipidemia, interstitial lung disease, GERD, vasculitis involving the lungs and kidneys on chronic Imuran therapy.  He was having upper abdominal discomfort for a few months with loss of about 15-20 pounds. He had a CT scan of the abdomen done by Dr. Havery Moros on 06/15/2016 which showed a 3.3 x 3.2 x 2.4 cm indeterminate lesion involving the left upper quadrant small bowel loops. No additional small bowel lesions noted. No ascites. No retroperitoneal mesenteric or pelvic lymphadenopathy noted.  Patient was seen by Dr. Serita Grammes and underwent a diagnostic laparoscopy with small bowel resection on 07/12/2016. Pathology showed that the lesion was consistent with diffuse large B-cell lymphoma 2 cm in length and 4 cm in width centrally ulcerated mass involving 100% of the bowel circumference. The tumor was about 1 cm thick invading through the entire wall and into the underlying mesenteric fat. The tumor also abuts and focally involve the serosa. No extramural satellite lymph nodes noted. Did not seem to involve the peri-intestinal lymph nodes. Tumor was noted to have a germinal center  phenotype.  Patient is healing well from surgery. Continues to be on Imuran 100 mg by mouth daily. Has not reported any other peripheral enlarged lymph nodes. He is starting to eat better.   INTERVAL HISTORY  Patient is here for follow-up with his 6th cycle of R-CEOP ctx for DLBCL. He started as a sixth cycle yesterday. Labs are stable with no neutropenia and stable anemia as well as stable kidney function. He notes he has still been on a soft diet from his diverticulitis with birth. He completed his antibiotics last Sunday. Notes no abdominal pain diarrhea or constipation. Notes no other acute new symptoms. Notes that he is eating well and his weight has been stable. He had a recent follow-up with Dr. Meredeth Ide.  MEDICAL HISTORY:  Past Medical History:  Diagnosis Date  . ALLERGIC RHINITIS   . Anemia    hx  . Barrett's esophagus   . BOOP (bronchiolitis obliterans with organizing pneumonia) ([place])    a. s/p R VATS 2008.  [name] CAD (coronary artery disease)    a. 05/2000: NSTEMI/CABG x 6: LIMA->LAD, VG->D1, VG->OM1->2, VG->PDA->RPL;  b. 07/2007 MV: high lat infarct, no ischemia, EF 47%;  c. Cath/PCI: LM nl, LAD20p, 80m, D1 nl, D2 60-70ost, D3 nl, LCX 70ost, 130m, OM1/OM2 min irregs, RCA 30 diff, PDA 99, LIMA->LAD atretic, VG->D1 100, VG->OM1->2 100, VG->PDA->RPL 90p (4.0x23 Vision BMS);  c. 07/2012 Echo: EF 55%, gr1 DD.  [name] Cataract   . CKD (chronic kidney disease), stage III    a. renal bx 2008: GLN with vasculitis  . Diffuse large B cell lymphoma ([place]) 08/07/2016  . GERD (gastroesophageal reflux disease)   . Glaucoma    a. Cannot see  out of L eye.  . Hematuria    Microscopic  . Hyperglycemia    Patient reported while on prednisone, had to take insulin  . Hyperlipemia   . Hypertension   . Hypothyroidism   . ILD (interstitial lung disease) ([place])   . Iritis   . Local infection of skin and subcutaneous tissue   . Membranoproliferative nephritis   . Myocardial infarction ([place]) 2002  .  Neutropenia, drug-induced ([place])   . Recurrent boils   . Residual foreign body in soft tissue   . Vasculitis ([place])    a.  pauciimmune vasculitis with renal involvement and hx of transient hemoptysis in the past with associated BOOP, 2008 (renal bx 2008: GLN with vasculitis). b. History of treatment with 2 cycles of Cytoxan and pheresis. H/o hemoptysis and pulm hemorrhage with 2nd cycle of cytoxan.    SURGICAL HISTORY: Past Surgical History:  Procedure Laterality Date  . BOWEL RESECTION N/A 07/12/2016   Procedure: SMALL BOWEL RESECTION;  Surgeon: Rolm Bookbinder, MD;  Location: [facility];  Service: General;  Laterality: N/A;  . CATARACT EXTRACTION Bilateral   . CORONARY ARTERY BYPASS GRAFT  2002  . IR FLUORO GUIDE PORT INSERTION RIGHT  08/20/2016  . IR US GUIDE VASC ACCESS RIGHT  08/20/2016  . LAPAROSCOPY N/A 07/12/2016   Procedure: LAPAROSCOPY DIAGNOSTIC;  Surgeon: Rolm Bookbinder, MD;  Location: [facility];  Service: General;  Laterality: N/A;  . LEFT HEART CATHETERIZATION WITH CORONARY/GRAFT ANGIOGRAM N/A 07/26/2012   Procedure: LEFT HEART CATHETERIZATION WITH Beatrix Fetters;  Surgeon: Wellington Hampshire, MD;  Location: [facility] CATH LAB;  Service: Cardiovascular;  Laterality: N/A;  . LUNG BIOPSY    . PERCUTANEOUS CORONARY STENT INTERVENTION (PCI-S)  07/26/2012   Procedure: PERCUTANEOUS CORONARY STENT INTERVENTION (PCI-S);  Surgeon: Wellington Hampshire, MD;  Location: [facility] CATH [facility];  Service: Cardiovascular;;  . RENAL BIOPSY    . TOE AMPUTATION  2009   hammer toe    SOCIAL HISTORY: Social History   Social History  . Marital status: Married    Spouse name: N/A  . Number of children: N/A  . Years of education: N/A   Occupational History  . [place] History Main Topics  . Smoking status: Never Smoker  . Smokeless tobacco: Never Used  . Alcohol use No  . Drug use: No  . Sexual activity: Not on file   Other Topics Concern  . Not on file   Social History Narrative   Regular  exercise - yes      [facility] (07/12/16):   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  no pets currently. No bird or mold exposure.    FAMILY HISTORY: Family History  Problem Relation Age of Onset  . Heart disease Mother   . Diabetes Mother   . Prostate cancer Father   . Depression Other   . Diabetes Other   . Prostate cancer Other   . Colon polyps Neg Hx   . Colon cancer Neg Hx   . Rectal cancer Neg Hx   . Stomach cancer Neg Hx   . Esophageal cancer Neg Hx     ALLERGIES:  is allergic to no known allergies.  MEDICATIONS:  Current Outpatient Prescriptions  Medication Sig Dispense Refill  . acetaminophen (TYLENOL) 500 MG tablet Take 500-1,000 mg by mouth daily as needed for moderate pain or headache.    [name] aspirin EC 81 MG EC tablet Take 1 tablet (81 mg total)  by mouth daily. 30 tablet   . clopidogrel (PLAVIX) 75 MG tablet Take 1 tablet (75 mg total) by mouth daily. Please call and schedule a one year follow up appointment for October 90 tablet 0  . finasteride (PROSCAR) 5 MG tablet Take 5 mg by mouth at bedtime.     . furosemide (LASIX) 20 MG tablet Take 20 mg by mouth daily.    [name] levothyroxine (SYNTHROID, LEVOTHROID) 100 MCG tablet Take 100 mcg by mouth daily.      [name] lidocaine-prilocaine (EMLA) cream Apply to affected area once 30 g 3  . metoprolol tartrate (LOPRESSOR) 25 MG tablet TAKE ONE-HALF TABLET BY  MOUTH TWO TIMES DAILY 90 tablet 1  . Multiple Vitamin (MULTIVITAMIN) tablet Take 1 tablet by mouth daily.      [name] NITROSTAT 0.4 MG SL tablet PLACE 1 TABLET (0.4 MG TOTAL) UNDER THE TONGUE EVERY 5 (FIVE) MINUTES AS NEEDED. MAY REPEAT X3 (Patient taking differently: Place 0.4 mg under the tongue every 5 (five) minutes as needed for chest pain. May repeat x3) 25 tablet 2  . pantoprazole (PROTONIX) 40 MG tablet TAKE 1 TABLET BY MOUTH  DAILY 90 tablet 1  . predniSONE (DELTASONE) 20 MG tablet Take 3 tablets (60 mg total) by mouth daily. Take on days 1-5 of  chemotherapy. 15 tablet 6  . Tamsulosin HCl (FLOMAX) 0.4 MG CAPS Take 0.4 mg by mouth at bedtime.     [name] telmisartan (MICARDIS) 40 MG tablet Take 20 mg by mouth daily.     [name] triamcinolone (KENALOG) 0.025 % cream Apply topically 2 (two) times daily. (Patient not taking: Reported on 10/30/2016) 30 g 0   No current facility-administered medications for this visit.    Facility-Administered Medications Ordered in Other Visits  Medication Dose Route Frequency Provider Last Rate Last Dose  . sodium chloride flush (NS) 0.9 % injection 10 mL  10 mL Intracatheter PRN Brunetta Genera, MD   10 mL at 08/24/16 1653  . sodium chloride flush (NS) 0.9 % injection 10 mL  10 mL Intracatheter PRN Brunetta Genera, MD   10 mL at 10/07/16 1555  . sodium chloride flush (NS) 0.9 % injection 10 mL  10 mL Intracatheter PRN Brunetta Genera, MD   10 mL at 12/08/16 1151    REVIEW OF SYSTEMS:    10 Point review of Systems was done is negative except as noted above.  PHYSICAL EXAMINATION: ECOG PERFORMANCE STATUS: 2 - Symptomatic, <50% confined to bed  . Vitals:   12/08/16 0903  BP: 133/71  Pulse: 86  Resp: 18  Temp: 97.9 F (36.6 C)   Filed Weights   12/08/16 0903  Weight: 231 lb (104.8 kg)   .Body mass index is 30.48 kg/m.  GENERAL:alert, in no acute distress and comfortable SKIN: no acute rashes, no significant lesions EYES: conjunctiva are pink and non-injected, sclera anicteric OROPHARYNX: MMM, no exudates, no oropharyngeal erythema or ulceration NECK: supple, no JVD LYMPH:  no palpable lymphadenopathy in the cervical, axillary or inguinal regions LUNGS: clear to auscultation b/l with normal respiratory effort HEART: regular rate & rhythm ABDOMEN:  normoactive bowel sounds , non tender, not distended. Healed laparoscopic surgical scars Extremity: 1+ pitting pedal edema improved from last visit PSYCH: alert & oriented x 3 with fluent speech NEURO: no focal motor/sensory  deficits  LABORATORY DATA:  I have reviewed the data as listed . CBC Latest Ref Rng & Units 12/07/2016 11/11/2016 11/04/2016  WBC 4.0 - 10.3 10e3/uL 8.2  5.8 4.5  Hemoglobin 13.0 - 17.1 g/dL 9.5(L) 9.2(L) 8.8(L)  Hematocrit 38.4 - 49.9 % 30.5(L) 28.0(L) 27.0(L)  Platelets 140 - 400 10e3/uL 191 163 91(L)   . CMP Latest Ref Rng & Units 12/07/2016 11/11/2016 11/05/2016  Glucose 70 - 140 mg/dl 166(H) 165(H) 124(H)  BUN 7.0 - 26.0 mg/dL 29.7(H) 23.2 20  Creatinine 0.7 - 1.3 mg/dL 1.7(H) 1.7(H) 1.76(H)  Sodium 136 - 145 mEq/L 139 140 138  Potassium 3.5 - 5.1 mEq/L 4.1 4.1 4.1  Chloride 101 - 111 mmol/L - - 108  CO2 22 - 29 mEq/L 21(L) 25 21(L)  Calcium 8.4 - 10.4 mg/dL 8.8 8.9 8.0(L)  Total Protein 6.4 - 8.3 g/dL 6.2(L) 6.1(L) -  Total Bilirubin 0.20 - 1.20 mg/dL 0.34 0.36 -  Alkaline Phos 40 - 150 U/L 75 78 -  AST 5 - 34 U/L 15 12 -  ALT 0 - 55 U/L 18 20 -    . Lab Results  Component Value Date   LDH 196 10/26/2016     RADIOGRAPHIC STUDIES: I have personally reviewed the radiological images as listed and agreed with the findings in the report.  Nm Pet Image Restag (ps) Skull Base To Thigh  Result Date: 10/22/2016 CLINICAL DATA:  Subsequent treatment strategy for diffuse large B-cell non-Hodgkin's lymphoma. EXAM: NUCLEAR MEDICINE PET SKULL BASE TO THIGH TECHNIQUE: 11.15 mCi F-18 FDG was injected intravenously. Full-ring PET imaging was performed from the skull base to thigh after the radiotracer. CT data was obtained and used for attenuation correction and anatomic localization. FASTING BLOOD GLUCOSE:  Value: 102 mg/dl COMPARISON:  None. FINDINGS: NECK No hypermetabolic lymph nodes in the neck. CHEST The FDG avid right axillary lymph nodes and epicardial lymph nodes have resolved in the interval. The focal hypermetabolic activity in the left ventricular myocardium described on the previous study is unchanged. No new abnormalities in the chest. ABDOMEN/PELVIS Significant improvement in the  abdomen. No definitive FDG avid disease remains in the liver or spleen. The adenopathy in the abdomen has resolved. Minimal uptake in the normal appearing left adrenal gland is of no significance. No abnormal uptake seen in the soft tissues of the pelvis. The omental nodularity seen previously has resolved. There is increased attenuation in the fat associated with the anterior ventral hernia with low level uptake, likely inflammatory. The maximum SUV is measured on image 161 is 3.7. SKELETON There has been significant overall improvement in the bones as well. Uptake in the anterior left acetabulum has significantly diminished with a maximum SUV of 3.4 today versus 29.4 previously. The left iliac uptake seen previously is no longer significantly FDG avid. There is mild focal uptake in the posterior left iliac bone on series 4, image 166 which was not seen previously and demonstrates a maximum SUV of 4.26 today. No other bony abnormalities are identified. IMPRESSION: 1. Significant interval improvement. No FDG avid disease remains in the soft tissues of the neck, chest, abdomen, or pelvis. Very mild uptake remains in the known left anterior acetabular lesion. Mild new uptake in the posterior left iliac bone is nonspecific but could represent mild involvement. Recommend attention on follow-up. 2. Increased attenuation and mild uptake in the fat of a lower midline ventral hernia is likely inflammatory. Recommend attention on follow-up. 3. No other interval change. Electronically Signed   By: Dorise Bullion III M.D   On: 10/22/2016 15:33   PET/CT 08/06/2016: IMPRESSION: 1. In addition to hypermetabolic nodal involvement of the right axilla, pericardial space,  porta hepatis/mesenteric root, and retroperitoneum, there is extensive involvement of the liver as well as peritoneal involvement along the liver surface, splenic surface, and upper omentum. 2. Hypermetabolic bony lesions in the left pelvis compatible  with malignant involvement. 3. There is a focus of hypermetabolic activity along the anterolateral wall of the left ventricle. Normally such findings turn out to simply be hypermetabolic myocardium. Strictly speaking given how focal this appears I cannot completely exclude the possibility of tumor involvement within or along the myocardium.   Electronically Signed   By: Van Clines M.D.   On: 08/06/2016 12:48      ASSESSMENT & PLAN:   81 yo male with multiple medical co-morbidities including cardiac co-morbidities, IPF, vascular on chronic immunosupression with   1)Stage IV Diffuse large B-cell lymphoma involving the small intestine (germinal cell phenotype) with complete thickness involvement of bowel and abutting serosa.  Initial PET/CT scan shows extensive involvement with DLBCL, hypermetabolic nodal involvement of the right axilla, pericardial space, porta hepatis/mesenteric root, and retroperitoneum, there is extensive involvement of the liver as well as peritoneal involvement along the liver surface, splenic surface, and upper omentum. 2. Hypermetabolic bony lesions in the left pelvis compatible with malignant involvement. 3. There is a focus of hypermetabolic activity along the anterolateral wall of the left ventricle.   s/p 4 cycles of R-CEOP . Tolerated second and third cycle of treatment better with dose reductions .  PET/CT scan on 10/22/2016 after 3 cycles of chemotherapy shows excellent response to treatment.  A)Status post neutropenic fever and E.coli/pseudomonal bacteremia/HCAP after the first cycle of treatment.  Significant chemotherapy related thrombocytopenia - now resolved. B) Recent hospitalization for perforation diverticulitis - symptoms completely resolved.   2) chronic kidney disease stage III baseline creatinine about 2. Renal function stable . 3) coronary disease status post PCI and CABG - had a myocardial perfusion imaging scan on 06/30/2016 that  showed a moderately decreased ejection fraction of 30-44%- this precludes anthracycline use currently. 4) history of pulmonary and renal vasculitis previously on chronic Imuran. Now off Imuran considering chemotherapy and Rituxan use.  5) Neutropenia -resolved 6) thrombocytopenia due to chemotherapy -resolved  Plan -labs are stable and he is doing well and has no residual or recurrent symptoms from his recent perforated sigmoid diverticulitis. -he continues on soft diet and has a surgery follow-up. He has completed his 4 weeks of antibiotics. -He will complete his sixth cycle of R-CEOP treatment with unchanged ctx doses and supportive medications. -continue with dose reduced R-CEOP (Etoposide to 25mg /m2 and Cyclophosphamide to 500mg /m2) -G-CSF support with neulasta -he understand that he need to seek immediate attention for any fever/chills/abd pain/nausea/vomiting/hematochezia or other GI bleeding. -We will plan to see him back in 4 weeks with a repeat posttreatment PET/CT scan.  -Complete last cycle of chemotherapy patient is undergoing currently -PET/CT scan 3 weeks -Return to clinic with Dr. Irene Limbo in 4 weeks with repeat labs  All of the patients questions were answered with apparent satisfaction. The patient knows to call the clinic with any problems, questions or concerns.  I spent 20 minutes counseling the patient face to face. The total time spent in the appointment was 25 minutes and more than 50% was on counseling and direct patient cares.    Sullivan Lone MD [place] AAHIVMS [facility] Hematology/Oncology [facility]  (Office):       [phone] (Work cell):  [phone] (Fax):           [phone]

## 2016-12-08 NOTE — Telephone Encounter (Signed)
Scheduled appt per 7/25 los - Gave patient AVS and calender per los. - Central radiology to contact patient with PET schedule.

## 2016-12-08 NOTE — Patient Instructions (Signed)
[facility] Discharge Instructions for Patients Receiving Chemotherapy  Today you received the following chemotherapy agents: Etoposide   To help prevent nausea and vomiting after your treatment, we encourage you to take your nausea medication as directed.    If you develop nausea and vomiting that is not controlled by your nausea medication, call the clinic.   BELOW ARE SYMPTOMS THAT SHOULD BE REPORTED IMMEDIATELY:  *FEVER GREATER THAN 100.5 F  *CHILLS WITH OR WITHOUT FEVER  NAUSEA AND VOMITING THAT IS NOT CONTROLLED WITH YOUR NAUSEA MEDICATION  *UNUSUAL SHORTNESS OF BREATH  *UNUSUAL BRUISING OR BLEEDING  TENDERNESS IN MOUTH AND THROAT WITH OR WITHOUT PRESENCE OF ULCERS  *URINARY PROBLEMS  *BOWEL PROBLEMS  UNUSUAL RASH Items with * indicate a potential emergency and should be followed up as soon as possible.  Feel free to call the clinic you have any questions or concerns. The clinic phone number is [phone].  Please show the CHEMO ALERT CARD at check-in to the Emergency Department and triage nurse.   

## 2016-12-09 ENCOUNTER — Ambulatory Visit (HOSPITAL_BASED_OUTPATIENT_CLINIC_OR_DEPARTMENT_OTHER): Payer: Medicare Other

## 2016-12-09 ENCOUNTER — Telehealth: Payer: Self-pay | Admitting: *Deleted

## 2016-12-09 VITALS — BP 139/77 | HR 72 | Temp 98.0°F | Resp 20

## 2016-12-09 DIAGNOSIS — Z7189 Other specified counseling: Secondary | ICD-10-CM

## 2016-12-09 DIAGNOSIS — Z5111 Encounter for antineoplastic chemotherapy: Secondary | ICD-10-CM | POA: Diagnosis not present

## 2016-12-09 DIAGNOSIS — C8338 Diffuse large B-cell lymphoma, lymph nodes of multiple sites: Secondary | ICD-10-CM

## 2016-12-09 MED ORDER — PEGFILGRASTIM 6 MG/0.6ML ~~LOC~~ PSKT
6.0000 mg | PREFILLED_SYRINGE | Freq: Once | SUBCUTANEOUS | Status: AC
  Administered 2016-12-09: 6 mg via SUBCUTANEOUS
  Filled 2016-12-09: qty 0.6

## 2016-12-09 MED ORDER — DEXAMETHASONE SODIUM PHOSPHATE 10 MG/ML IJ SOLN
INTRAMUSCULAR | Status: AC
  Filled 2016-12-09: qty 1

## 2016-12-09 MED ORDER — HEPARIN SOD (PORK) LOCK FLUSH 100 UNIT/ML IV SOLN
500.0000 [IU] | Freq: Once | INTRAVENOUS | Status: AC | PRN
  Administered 2016-12-09: 500 [IU]
  Filled 2016-12-09: qty 5

## 2016-12-09 MED ORDER — PROCHLORPERAZINE MALEATE 10 MG PO TABS
10.0000 mg | ORAL_TABLET | Freq: Once | ORAL | Status: AC
  Administered 2016-12-09: 10 mg via ORAL

## 2016-12-09 MED ORDER — SODIUM CHLORIDE 0.9% FLUSH
10.0000 mL | INTRAVENOUS | Status: DC | PRN
  Administered 2016-12-09: 10 mL
  Filled 2016-12-09: qty 10

## 2016-12-09 MED ORDER — SODIUM CHLORIDE 0.9 % IV SOLN
25.0000 mg/m2 | Freq: Once | INTRAVENOUS | Status: AC
  Administered 2016-12-09: 60 mg via INTRAVENOUS
  Filled 2016-12-09: qty 3

## 2016-12-09 MED ORDER — SODIUM CHLORIDE 0.9 % IV SOLN
Freq: Once | INTRAVENOUS | Status: AC
  Administered 2016-12-09: 09:00:00 via INTRAVENOUS

## 2016-12-09 MED ORDER — PROCHLORPERAZINE MALEATE 10 MG PO TABS
ORAL_TABLET | ORAL | Status: AC
  Filled 2016-12-09: qty 1

## 2016-12-09 MED ORDER — DEXAMETHASONE SODIUM PHOSPHATE 10 MG/ML IJ SOLN
10.0000 mg | Freq: Once | INTRAMUSCULAR | Status: AC
Start: 2016-12-09 — End: 2016-12-09
  Administered 2016-12-09: 10 mg via INTRAVENOUS

## 2016-12-09 NOTE — Telephone Encounter (Signed)
"  There today for treatment.  Discharged at 10:27 am.  Right chest Port-a-cath is still bleeding.  I've put gauze in port but changed shirts three times so far.  Spot of blood has been the size of a fifty cent piece.  Bleeding from where needle went in.  I'm on Plavix.  What do I need to do?" 12-08-2016 Pltc = 191. Nurse instructions for spouse to apply gloves, new clean gauze and apply deep, continuous pressure to port area for ten minutes.  More comfort ease and success if he lies flat with spouse along his right side applying pressure.  Has bandage tape.  Instructed to apply tape over clean gauze taught over skin to continue pressure for 24 hours.  Call back with update.  May need to come to [facility].  Lives 25 minutes away in [place].  Ended call at 4:11 pm.  Awaiting return call from patient.

## 2016-12-09 NOTE — Telephone Encounter (Signed)
Called patient's mobile due to no return call.  "It stopped bleeding.  Thank you for your help.  I was lying down, there is no blood on the gauze she used to hold pressure.  We have the dot bandaids too."  Instructed to apply a dot bandaid.  Fold 4x4 gauze in half and half again OR use two, 2x2 gauze to create bulk.   Apply gauzed over dot.  Apply bandage tape taught horizontally to leave in tact for 24 hours.  May reinforce vertically to secure.  Wash his bloody clothing immediately, two times, separate from others laundry.  May seal in bag to discard or for laundering later.  Verbalized understanding.  No further questions. Care Coordination note created.

## 2016-12-09 NOTE — Patient Instructions (Signed)
[facility] Discharge Instructions for Patients Receiving Chemotherapy  Today you received the following chemotherapy agents:  Etoposide  To help prevent nausea and vomiting after your treatment, we encourage you to take your nausea medication.   If you develop nausea and vomiting that is not controlled by your nausea medication, call the clinic.   BELOW ARE SYMPTOMS THAT SHOULD BE REPORTED IMMEDIATELY:  *FEVER GREATER THAN 100.5 F  *CHILLS WITH OR WITHOUT FEVER  NAUSEA AND VOMITING THAT IS NOT CONTROLLED WITH YOUR NAUSEA MEDICATION  *UNUSUAL SHORTNESS OF BREATH  *UNUSUAL BRUISING OR BLEEDING  TENDERNESS IN MOUTH AND THROAT WITH OR WITHOUT PRESENCE OF ULCERS  *URINARY PROBLEMS  *BOWEL PROBLEMS  UNUSUAL RASH Items with * indicate a potential emergency and should be followed up as soon as possible.  Feel free to call the clinic you have any questions or concerns. The clinic phone number is [phone].  Please show the CHEMO ALERT CARD at check-in to the Emergency Department and triage nurse.   

## 2016-12-15 DIAGNOSIS — C8339 Diffuse large B-cell lymphoma, extranodal and solid organ sites: Secondary | ICD-10-CM | POA: Diagnosis not present

## 2016-12-15 DIAGNOSIS — K5732 Diverticulitis of large intestine without perforation or abscess without bleeding: Secondary | ICD-10-CM | POA: Diagnosis not present

## 2016-12-15 DIAGNOSIS — N183 Chronic kidney disease, stage 3 (moderate): Secondary | ICD-10-CM | POA: Diagnosis not present

## 2016-12-29 ENCOUNTER — Ambulatory Visit (HOSPITAL_COMMUNITY)
Admission: RE | Admit: 2016-12-29 | Discharge: 2016-12-29 | Disposition: A | Discharge status: Home / Self Care | Payer: Medicare Other | Source: Ambulatory Visit | Attending: Hematology | Admitting: Hematology

## 2016-12-29 DIAGNOSIS — I700 Atherosclerosis of aorta: Secondary | ICD-10-CM | POA: Diagnosis not present

## 2016-12-29 DIAGNOSIS — C8512 Unspecified B-cell lymphoma, intrathoracic lymph nodes: Secondary | ICD-10-CM | POA: Diagnosis not present

## 2016-12-29 DIAGNOSIS — I77819 Aortic ectasia, unspecified site: Secondary | ICD-10-CM | POA: Diagnosis not present

## 2016-12-29 DIAGNOSIS — C8339 Diffuse large B-cell lymphoma, extranodal and solid organ sites: Secondary | ICD-10-CM

## 2016-12-29 LAB — GLUCOSE, CAPILLARY: GLUCOSE-CAPILLARY: 110 mg/dL — AB (ref 65–99)

## 2016-12-29 MED ORDER — FLUDEOXYGLUCOSE F - 18 (FDG) INJECTION
10.9600 | Freq: Once | INTRAVENOUS | Status: AC | PRN
  Administered 2016-12-29: 10.96 via INTRAVENOUS

## 2017-01-03 ENCOUNTER — Encounter: Payer: Self-pay | Admitting: Cardiology

## 2017-01-03 ENCOUNTER — Ambulatory Visit (INDEPENDENT_AMBULATORY_CARE_PROVIDER_SITE_OTHER): Payer: Medicare Other | Admitting: Cardiology

## 2017-01-03 VITALS — BP 130/72 | HR 81 | Ht 73.0 in | Wt 227.8 lb

## 2017-01-03 DIAGNOSIS — I2510 Atherosclerotic heart disease of native coronary artery without angina pectoris: Secondary | ICD-10-CM

## 2017-01-03 DIAGNOSIS — R0609 Other forms of dyspnea: Secondary | ICD-10-CM

## 2017-01-03 DIAGNOSIS — I2583 Coronary atherosclerosis due to lipid rich plaque: Secondary | ICD-10-CM | POA: Diagnosis not present

## 2017-01-03 DIAGNOSIS — I10 Essential (primary) hypertension: Secondary | ICD-10-CM | POA: Diagnosis not present

## 2017-01-03 NOTE — Patient Instructions (Addendum)
Medication Instructions:  Your physician recommends that you continue on your current medications as directed. Please refer to the Current Medication list given to you today.   Labwork: None ordered  Testing/Procedures: Your physician has requested that you have an echocardiogram. Echocardiography is a painless test that uses sound waves to create images of your heart. It provides your doctor with information about the size and shape of your heart and how well your heart's chambers and valves are working. This procedure takes approximately one hour. There are no restrictions for this procedure.    Follow-Up: Your physician wants you to follow-up in: [place] DR. Blima Singer will receive a reminder letter in the mail two months in advance. If you don't receive a letter, please call our office to schedule the follow-up appointment.  Any Other Special Instructions Will Be Listed Below (If Applicable).  Echocardiogram An echocardiogram, or echocardiography, uses sound waves (ultrasound) to produce an image of your heart. The echocardiogram is simple, painless, obtained within a short period of time, and offers valuable information to your health care provider. The images from an echocardiogram can provide information such as:  Evidence of coronary artery disease (CAD).  Heart size.  Heart muscle function.  Heart valve function.  Aneurysm detection.  Evidence of a past heart attack.  Fluid buildup around the heart.  Heart muscle thickening.  Assess heart valve function.  Tell a health care provider about:  Any allergies you have.  All medicines you are taking, including vitamins, herbs, eye drops, creams, and over-the-counter medicines.  Any problems you or family members have had with anesthetic medicines.  Any blood disorders you have.  Any surgeries you have had.  Any medical conditions you have.  Whether you are pregnant or may be pregnant. What happens  before the procedure? No special preparation is needed. Eat and drink normally. What happens during the procedure?  In order to produce an image of your heart, gel will be applied to your chest and a wand-like tool (transducer) will be moved over your chest. The gel will help transmit the sound waves from the transducer. The sound waves will harmlessly bounce off your heart to allow the heart images to be captured in real-time motion. These images will then be recorded.  You may need an IV to receive a medicine that improves the quality of the pictures. What happens after the procedure? You may return to your normal schedule including diet, activities, and medicines, unless your health care provider tells you otherwise. This information is not intended to replace advice given to you by your health care provider. Make sure you discuss any questions you have with your health care provider. Document Released: 04/30/2000 Document Revised: 12/20/2015 Document Reviewed: 01/08/2013 Elsevier Interactive Patient Education  2017 [employer].    If you need a refill on your cardiac medications before your next appointment, please call your pharmacy.

## 2017-01-03 NOTE — Progress Notes (Signed)
01/03/2017 Luke Mcbride   08-17-1935  [id number]  Primary Physician Donato Heinz, MD Primary Cardiologist: Dr. [name]    Reason for Visit/CC: 1 year f/u for CAD   HPI:  Luke Mcbride is a 81 y.o. male who is being seen today for routine f/u for CAD. He has been followed by Dr. [name]. He had CABG in 2002. He was admitted 07/2012 with NSTEMI and underwent BMS to the SVR-PDA/PL. His LIMA to LAD was atretic but disease in the LAD was moderate. SVG-OM and SVG to diagonal were known to be occluded. Other PMH includes DM, HTN, HLD and CKD.   Most recently, in February, he was diagnosed with Stage IV Diffuse large B-cell lymphoma involving the small intestine. He required surgery.  Prior to surgery, he had a nuclear stress test on 07/01/16 which showed scar but no ischemia. He was cleared to undergo surgery. He had 6 in of his colon resected. Following surgery he had 6 rounds of chemo. He has completed this and had a f/u PET scan. He goes back to his oncologist later this week to discuss results.   From a cardiac standpoint, he denies any CP. He notes mild exertional dyspnea and mild LEE but denies orthopnea and PND. No dizziness, palpitations, syncope/ near syncope. BP is well controlled. He remains on ASA, Plavix and BB. He notes that his Crestor was stopped in May. He is unsure why but believes it may have been due to possible drug interaction with his chemo meds.   Current Meds  Medication Sig  . acetaminophen (TYLENOL) 500 MG tablet Take 500-1,000 mg by mouth daily as needed for moderate pain or headache.  [name] aspirin EC 81 MG EC tablet Take 1 tablet (81 mg total) by mouth daily.  . clopidogrel (PLAVIX) 75 MG tablet Take 1 tablet (75 mg total) by mouth daily. Please call and schedule a one year follow up appointment for October  . finasteride (PROSCAR) 5 MG tablet Take 5 mg by mouth at bedtime.   . furosemide (LASIX) 20 MG tablet Take 20 mg by mouth daily.  [name] levothyroxine (SYNTHROID,  LEVOTHROID) 100 MCG tablet Take 100 mcg by mouth daily.    [name] lidocaine-prilocaine (EMLA) cream Apply to affected area once  . metoprolol tartrate (LOPRESSOR) 25 MG tablet TAKE ONE-HALF TABLET BY  MOUTH TWO TIMES DAILY  . Multiple Vitamin (MULTIVITAMIN) tablet Take 1 tablet by mouth daily.    [name] NITROSTAT 0.4 MG SL tablet PLACE 1 TABLET (0.4 MG TOTAL) UNDER THE TONGUE EVERY 5 (FIVE) MINUTES AS NEEDED. MAY REPEAT X3 (Patient taking differently: Place 0.4 mg under the tongue every 5 (five) minutes as needed for chest pain. May repeat x3)  . pantoprazole (PROTONIX) 40 MG tablet TAKE 1 TABLET BY MOUTH  DAILY  . Tamsulosin HCl (FLOMAX) 0.4 MG CAPS Take 0.4 mg by mouth at bedtime.   [name] telmisartan (MICARDIS) 40 MG tablet Take 20 mg by mouth daily.    Allergies  Allergen Reactions  . No Known Allergies    Past Medical History:  Diagnosis Date  . ALLERGIC RHINITIS   . Anemia    hx  . Barrett's esophagus   . BOOP (bronchiolitis obliterans with organizing pneumonia) ([place])    a. s/p R VATS 2008.  [name] CAD (coronary artery disease)    a. 05/2000: NSTEMI/CABG x 6: LIMA->LAD, VG->D1, VG->OM1->2, VG->PDA->RPL;  b. 07/2007 MV: high lat infarct, no ischemia, EF 47%;  c. Cath/PCI: LM nl, LAD20p, 53m, D1  nl, D2 60-70ost, D3 nl, LCX 70ost, 142m, OM1/OM2 min irregs, RCA 30 diff, PDA 99, LIMA->LAD atretic, VG->D1 100, VG->OM1->2 100, VG->PDA->RPL 90p (4.0x23 Vision BMS);  c. 07/2012 Echo: EF 55%, gr1 DD.  [name] Cataract   . CKD (chronic kidney disease), stage III    a. renal bx 2008: GLN with vasculitis  . Diffuse large B cell lymphoma ([place]) 08/07/2016  . GERD (gastroesophageal reflux disease)   . Glaucoma    a. Cannot see out of L eye.  . Hematuria    Microscopic  . Hyperglycemia    Patient reported while on prednisone, had to take insulin  . Hyperlipemia   . Hypertension   . Hypothyroidism   . ILD (interstitial lung disease) ([place])   . Iritis   . Local infection of skin and subcutaneous tissue   .  Membranoproliferative nephritis   . Myocardial infarction ([place]) 2002  . Neutropenia, drug-induced ([place])   . Recurrent boils   . Residual foreign body in soft tissue   . Vasculitis ([place])    a.  pauciimmune vasculitis with renal involvement and hx of transient hemoptysis in the past with associated BOOP, 2008 (renal bx 2008: GLN with vasculitis). b. History of treatment with 2 cycles of Cytoxan and pheresis. H/o hemoptysis and pulm hemorrhage with 2nd cycle of cytoxan.   Family History  Problem Relation Age of Onset  . Heart disease Mother   . Diabetes Mother   . Prostate cancer Father   . Depression Other   . Diabetes Other   . Prostate cancer Other   . Colon polyps Neg Hx   . Colon cancer Neg Hx   . Rectal cancer Neg Hx   . Stomach cancer Neg Hx   . Esophageal cancer Neg Hx    Past Surgical History:  Procedure Laterality Date  . BOWEL RESECTION N/A 07/12/2016   Procedure: SMALL BOWEL RESECTION;  Surgeon: Rolm Bookbinder, MD;  Location: [facility];  Service: General;  Laterality: N/A;  . CATARACT EXTRACTION Bilateral   . CORONARY ARTERY BYPASS GRAFT  2002  . IR FLUORO GUIDE PORT INSERTION RIGHT  08/20/2016  . IR US GUIDE VASC ACCESS RIGHT  08/20/2016  . LAPAROSCOPY N/A 07/12/2016   Procedure: LAPAROSCOPY DIAGNOSTIC;  Surgeon: Rolm Bookbinder, MD;  Location: [facility];  Service: General;  Laterality: N/A;  . LEFT HEART CATHETERIZATION WITH CORONARY/GRAFT ANGIOGRAM N/A 07/26/2012   Procedure: LEFT HEART CATHETERIZATION WITH Beatrix Fetters;  Surgeon: Wellington Hampshire, MD;  Location: [facility] CATH LAB;  Service: Cardiovascular;  Laterality: N/A;  . LUNG BIOPSY    . PERCUTANEOUS CORONARY STENT INTERVENTION (PCI-S)  07/26/2012   Procedure: PERCUTANEOUS CORONARY STENT INTERVENTION (PCI-S);  Surgeon: Wellington Hampshire, MD;  Location: [facility] CATH [facility];  Service: Cardiovascular;;  . RENAL BIOPSY    . TOE AMPUTATION  2009   hammer toe   Social History   Social History  . Marital status: Married     Spouse name: N/A  . Number of children: N/A  . Years of education: N/A   Occupational History  . [place] History Main Topics  . Smoking status: Never Smoker  . Smokeless tobacco: Never Used  . Alcohol use No  . Drug use: No  . Sexual activity: Not on file   Other Topics Concern  . Not on file   Social History Narrative   Regular exercise - yes      [facility] (07/12/16):   Lives with his wife.  Currently works  Air traffic controller.  no pets currently. No bird or mold exposure.     Review of Systems: General: negative for chills, fever, night sweats or weight changes.  Cardiovascular: negative for chest pain, dyspnea on exertion, edema, orthopnea, palpitations, paroxysmal nocturnal dyspnea or shortness of breath Dermatological: negative for rash Respiratory: negative for cough or wheezing Urologic: negative for hematuria Abdominal: negative for nausea, vomiting, diarrhea, bright red blood per rectum, melena, or hematemesis Neurologic: negative for visual changes, syncope, or dizziness All other systems reviewed and are otherwise negative except as noted above.   Physical Exam:  Blood pressure 130/72, pulse 81, height 6\' 1"  (1.854 m), weight 227 lb 12.8 oz (103.3 kg).  General appearance: alert, cooperative and no distress Neck: no carotid bruit and no JVD Lungs: clear to auscultation bilaterally Heart: regular rate and rhythm, S1, S2 normal, no murmur, click, rub or gallop Extremities: extremities normal, atraumatic, no cyanosis or edema Pulses: 2+ and symmetric Skin: Skin color, texture, turgor normal. No rashes or lesions Neurologic: Grossly normal  EKG 10/2016 NSR with PACs -- personally reviewed   ASSESSMENT AND PLAN:   1. CAD: h/o CABG in 2002 and stenting to his SVG-PDA/PL in 2014. Recent NST 06/2016 showed scar but no ischemia. He denies any chest pain but notes mild exertional dyspnea. We will arrange for a 2D echo to assess LVF, given he has  just completed chemotherapy. Continue ASA, Plavix and BB. His Crestor was discontinued in May by his oncologist. ? Drug interaction. We will check with oncologist to see if it is safe to resume or if we will need to continue to hold.   2. HTN: controlled on current regimen.   3. HLD: His Crestor was discontinued in May by his oncologist. ? Drug interaction. We will check with oncologist to see if it is safe to resume or if we will need to continue to hold.   4. DM: per PCP.   5. Stage IV Diffuse large B-cell lymphoma involving the small intestine:  S/p resection + chemo. F/u PET scan has been completed. Results pending.   6. CKD: followed by nephrology.    Follow-Up: with Dr. [name] in 6 months.   Charlie Seda Ladoris Gene, MHS [facility] 01/03/2017 11:50 AM

## 2017-01-05 ENCOUNTER — Other Ambulatory Visit (HOSPITAL_BASED_OUTPATIENT_CLINIC_OR_DEPARTMENT_OTHER): Payer: Medicare Other

## 2017-01-05 ENCOUNTER — Telehealth: Payer: Self-pay | Admitting: Hematology

## 2017-01-05 ENCOUNTER — Ambulatory Visit (HOSPITAL_BASED_OUTPATIENT_CLINIC_OR_DEPARTMENT_OTHER): Payer: Medicare Other | Admitting: Hematology

## 2017-01-05 ENCOUNTER — Encounter: Payer: Self-pay | Admitting: Hematology

## 2017-01-05 VITALS — BP 129/79 | HR 76 | Temp 98.2°F | Resp 18 | Ht 73.0 in | Wt 227.4 lb

## 2017-01-05 DIAGNOSIS — C8339 Diffuse large B-cell lymphoma, extranodal and solid organ sites: Secondary | ICD-10-CM

## 2017-01-05 DIAGNOSIS — C8338 Diffuse large B-cell lymphoma, lymph nodes of multiple sites: Secondary | ICD-10-CM | POA: Diagnosis not present

## 2017-01-05 DIAGNOSIS — Z95828 Presence of other vascular implants and grafts: Secondary | ICD-10-CM

## 2017-01-05 LAB — CBC & DIFF AND RETIC
BASO%: 0.5 % (ref 0.0–2.0)
Basophils Absolute: 0 10*3/uL (ref 0.0–0.1)
EOS%: 2.2 % (ref 0.0–7.0)
Eosinophils Absolute: 0.1 10*3/uL (ref 0.0–0.5)
HCT: 32.2 % — ABNORMAL LOW (ref 38.4–49.9)
HGB: 10 g/dL — ABNORMAL LOW (ref 13.0–17.1)
Immature Retic Fract: 16.2 % — ABNORMAL HIGH (ref 3.00–10.60)
LYMPH#: 0.9 10*3/uL (ref 0.9–3.3)
LYMPH%: 16.7 % (ref 14.0–49.0)
MCH: 32.5 pg (ref 27.2–33.4)
MCHC: 31.1 g/dL — AB (ref 32.0–36.0)
MCV: 104.5 fL — ABNORMAL HIGH (ref 79.3–98.0)
MONO#: 0.3 10*3/uL (ref 0.1–0.9)
MONO%: 5.6 % (ref 0.0–14.0)
NEUT%: 75 % (ref 39.0–75.0)
NEUTROS ABS: 4.1 10*3/uL (ref 1.5–6.5)
PLATELETS: 190 10*3/uL (ref 140–400)
RBC: 3.08 10*6/uL — AB (ref 4.20–5.82)
RDW: 17.8 % — AB (ref 11.0–14.6)
RETIC CT ABS: 107.18 10*3/uL — AB (ref 34.80–93.90)
Retic %: 3.48 % — ABNORMAL HIGH (ref 0.80–1.80)
WBC: 5.5 10*3/uL (ref 4.0–10.3)

## 2017-01-05 LAB — COMPREHENSIVE METABOLIC PANEL
ALT: 16 U/L (ref 0–55)
ANION GAP: 9 meq/L (ref 3–11)
AST: 16 U/L (ref 5–34)
Albumin: 3.3 g/dL — ABNORMAL LOW (ref 3.5–5.0)
Alkaline Phosphatase: 67 U/L (ref 40–150)
BUN: 37.7 mg/dL — ABNORMAL HIGH (ref 7.0–26.0)
CHLORIDE: 109 meq/L (ref 98–109)
CO2: 22 meq/L (ref 22–29)
CREATININE: 1.9 mg/dL — AB (ref 0.7–1.3)
Calcium: 9.2 mg/dL (ref 8.4–10.4)
EGFR: 32 mL/min/{1.73_m2} — ABNORMAL LOW (ref >90)
GLUCOSE: 121 mg/dL (ref 70–140)
Potassium: 4.3 mEq/L (ref 3.5–5.1)
SODIUM: 140 meq/L (ref 136–145)
Total Bilirubin: 0.34 mg/dL (ref 0.20–1.20)
Total Protein: 6.4 g/dL (ref 6.4–8.3)

## 2017-01-05 LAB — LACTATE DEHYDROGENASE: LDH: 180 U/L (ref 125–245)

## 2017-01-05 NOTE — Progress Notes (Signed)
HEMATOLOGY/ONCOLOGY CLINIC NOTE  Date of Service: .01/05/2017  Patient Care Team: Luke Heinz, Mcbride as PCP - General (Nephrology) Luke Genera, Mcbride as Consulting Physician (Hematology) Luke Hector, Mcbride as Consulting Physician (Cardiology)  CHIEF COMPLAINTS/PURPOSE OF CONSULTATION:  Diffuse large B cell lymphoma of the small intestine  HISTORY OF PRESENTING ILLNESS:   Luke Mcbride is a wonderful 81 y.o. male who has been referred to [place] by Luke .Luke Heinz, Mcbride and Luke Mcbride for evaluation and management of newly diagnosed diffuse large B-cell lymphoma of the small bowel.  Patient has a history of multiple medical comorbidities as noted below including coronary artery disease status post PCI and CABG, chronic kidney disease stage III, Barrett's esophagus, hypertension, dyslipidemia, interstitial lung disease, GERD, vasculitis involving the lungs and kidneys on chronic Imuran therapy.  He was having upper abdominal discomfort for a few months with loss of about 15-20 pounds. He had a CT scan of the abdomen done by Luke Mcbride on 06/15/2016 which showed a 3.3 x 3.2 x 2.4 cm indeterminate lesion involving the left upper quadrant small bowel loops. No additional small bowel lesions noted. No ascites. No retroperitoneal mesenteric or pelvic lymphadenopathy noted.  Patient was seen by Luke Mcbride and underwent a diagnostic laparoscopy with small bowel resection on 07/12/2016. Pathology showed that the lesion was consistent with diffuse large B-cell lymphoma 2 cm in length and 4 cm in width centrally ulcerated mass involving 100% of the bowel circumference. The tumor was about 1 cm thick invading through the entire wall and into the underlying mesenteric fat. The tumor also abuts and focally involve the serosa. No extramural satellite lymph nodes noted. Did not seem to involve the peri-intestinal lymph nodes. Tumor was noted to have a germinal center  phenotype.  Patient is healing well from surgery. Continues to be on Imuran 100 mg by mouth daily. Has not reported any other peripheral enlarged lymph nodes. He is starting to eat better.   INTERVAL HISTORY  Patient is here for follow-up after his 6th cycle of R-CEOP ctx for DLBCL and for and for review of his PET/CT results Notes no abdominal pain diarrhea or constipation. Notes no other acute new symptoms. Notes that he is eating well and his weight has been stable. No abdominal pain similar to his diverticulitis.   MEDICAL HISTORY:  Past Medical History:  Diagnosis Date  . ALLERGIC RHINITIS   . Anemia    hx  . Barrett's esophagus   . BOOP (bronchiolitis obliterans with organizing pneumonia) ([place])    a. s/p R VATS 2008.  [name] CAD (coronary artery disease)    a. 05/2000: NSTEMI/CABG x 6: LIMA->LAD, VG->D1, VG->OM1->2, VG->PDA->RPL;  b. 07/2007 MV: high lat infarct, no ischemia, EF 47%;  c. Cath/PCI: LM nl, LAD20p, 42m, D1 nl, D2 60-70ost, D3 nl, LCX 70ost, 155m, OM1/OM2 min irregs, RCA 30 diff, PDA 99, LIMA->LAD atretic, VG->D1 100, VG->OM1->2 100, VG->PDA->RPL 90p (4.0x23 Vision BMS);  c. 07/2012 Echo: EF 55%, gr1 DD.  [name] Cataract   . CKD (chronic kidney disease), stage III    a. renal bx 2008: GLN with vasculitis  . Diffuse large B cell lymphoma ([place]) 08/07/2016  . GERD (gastroesophageal reflux disease)   . Glaucoma    a. Cannot see out of L eye.  . Hematuria    Microscopic  . Hyperglycemia    Patient reported while on prednisone, had to take insulin  . Hyperlipemia   . Hypertension   .  Hypothyroidism   . ILD (interstitial lung disease) ([place])   . Iritis   . Local infection of skin and subcutaneous tissue   . Membranoproliferative nephritis   . Myocardial infarction ([place]) 2002  . Neutropenia, drug-induced ([place])   . Recurrent boils   . Residual foreign body in soft tissue   . Vasculitis ([place])    a.  pauciimmune vasculitis with renal involvement and hx of transient hemoptysis  in the past with associated BOOP, 2008 (renal bx 2008: GLN with vasculitis). b. History of treatment with 2 cycles of Cytoxan and pheresis. H/o hemoptysis and pulm hemorrhage with 2nd cycle of cytoxan.    SURGICAL HISTORY: Past Surgical History:  Procedure Laterality Date  . BOWEL RESECTION N/A 07/12/2016   Procedure: SMALL BOWEL RESECTION;  Surgeon: Luke Bookbinder, Mcbride;  Location: [facility];  Service: General;  Laterality: N/A;  . CATARACT EXTRACTION Bilateral   . CORONARY ARTERY BYPASS GRAFT  2002  . IR FLUORO GUIDE PORT INSERTION RIGHT  08/20/2016  . IR US GUIDE VASC ACCESS RIGHT  08/20/2016  . LAPAROSCOPY N/A 07/12/2016   Procedure: LAPAROSCOPY DIAGNOSTIC;  Surgeon: Luke Bookbinder, Mcbride;  Location: [facility];  Service: General;  Laterality: N/A;  . LEFT HEART CATHETERIZATION WITH CORONARY/GRAFT ANGIOGRAM N/A 07/26/2012   Procedure: LEFT HEART CATHETERIZATION WITH Luke Mcbride;  Surgeon: Luke Hampshire, Mcbride;  Location: [facility] CATH LAB;  Service: Cardiovascular;  Laterality: N/A;  . LUNG BIOPSY    . PERCUTANEOUS CORONARY STENT INTERVENTION (PCI-S)  07/26/2012   Procedure: PERCUTANEOUS CORONARY STENT INTERVENTION (PCI-S);  Surgeon: Luke Hampshire, Mcbride;  [facility];  Service: Cardiovascular;;  . RENAL BIOPSY    . TOE AMPUTATION  2009   hammer toe    SOCIAL HISTORY: Social History   Social History  . Marital status: Married    Spouse name: N/A  . Number of children: N/A  . Years of education: N/A   Occupational History  . [place] History Main Topics  . Smoking status: Never Smoker  . Smokeless tobacco: Never Used  . Alcohol use No  . Drug use: No  . Sexual activity: Not on file   Other Topics Concern  . Not on file   Social History Narrative   Regular exercise - yes      [facility] (07/12/16):   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  no pets currently. No bird or mold exposure.    FAMILY HISTORY: Family History    Problem Relation Age of Onset  . Heart disease Mother   . Diabetes Mother   . Prostate cancer Father   . Depression Other   . Diabetes Other   . Prostate cancer Other   . Colon polyps Neg Hx   . Colon cancer Neg Hx   . Rectal cancer Neg Hx   . Stomach cancer Neg Hx   . Esophageal cancer Neg Hx     ALLERGIES:  is allergic to no known allergies.  MEDICATIONS:  Current Outpatient Prescriptions  Medication Sig Dispense Refill  . acetaminophen (TYLENOL) 500 MG tablet Take 500-1,000 mg by mouth daily as needed for moderate pain or headache.    [name] aspirin EC 81 MG EC tablet Take 1 tablet (81 mg total) by mouth daily. 30 tablet   . clopidogrel (PLAVIX) 75 MG tablet Take 1 tablet (75 mg total) by mouth daily. Please call and schedule a one year follow up appointment for October 90  tablet 0  . finasteride (PROSCAR) 5 MG tablet Take 5 mg by mouth at bedtime.     . furosemide (LASIX) 20 MG tablet Take 20 mg by mouth daily.    [name] levothyroxine (SYNTHROID, LEVOTHROID) 100 MCG tablet Take 100 mcg by mouth daily.      [name] lidocaine-prilocaine (EMLA) cream Apply to affected area once 30 g 3  . metoprolol tartrate (LOPRESSOR) 25 MG tablet TAKE ONE-HALF TABLET BY  MOUTH TWO TIMES DAILY 90 tablet 1  . Multiple Vitamin (MULTIVITAMIN) tablet Take 1 tablet by mouth daily.      [name] NITROSTAT 0.4 MG SL tablet PLACE 1 TABLET (0.4 MG TOTAL) UNDER THE TONGUE EVERY 5 (FIVE) MINUTES AS NEEDED. MAY REPEAT X3 (Patient taking differently: Place 0.4 mg under the tongue every 5 (five) minutes as needed for chest pain. May repeat x3) 25 tablet 2  . pantoprazole (PROTONIX) 40 MG tablet TAKE 1 TABLET BY MOUTH  DAILY 90 tablet 1  . Tamsulosin HCl (FLOMAX) 0.4 MG CAPS Take 0.4 mg by mouth at bedtime.     [name] telmisartan (MICARDIS) 40 MG tablet Take 20 mg by mouth daily.      No current facility-administered medications for this visit.    Facility-Administered Medications Ordered in Other Visits  Medication Dose Route  Frequency Provider Last Rate Last Dose  . sodium chloride flush (NS) 0.9 % injection 10 mL  10 mL Intracatheter PRN Luke Genera, Mcbride   10 mL at 08/24/16 1653  . sodium chloride flush (NS) 0.9 % injection 10 mL  10 mL Intracatheter PRN Luke Genera, Mcbride   10 mL at 10/07/16 1555    REVIEW OF SYSTEMS:    10 Point review of Systems was done is negative except as noted above.  PHYSICAL EXAMINATION: ECOG PERFORMANCE STATUS: 2 - Symptomatic, <50% confined to bed  . Vitals:   01/05/17 0847  BP: 129/79  Pulse: 76  Resp: 18  Temp: 98.2 F (36.8 C)  SpO2: 98%   Filed Weights   01/05/17 0847  Weight: 227 lb 6.4 oz (103.1 kg)   .Body mass index is 30 kg/m.  GENERAL:alert, in no acute distress and comfortable SKIN: no acute rashes, no significant lesions EYES: conjunctiva are pink and non-injected, sclera anicteric OROPHARYNX: MMM, no exudates, no oropharyngeal erythema or ulceration NECK: supple, no JVD LYMPH:  no palpable lymphadenopathy in the cervical, axillary or inguinal regions LUNGS: clear to auscultation b/l with normal respiratory effort HEART: regular rate & rhythm ABDOMEN:  normoactive bowel sounds , non tender, not distended. Healed laparoscopic surgical scars Extremity: 1+ pitting pedal edema improved from last visit PSYCH: alert & oriented x 3 with fluent speech NEURO: no focal motor/sensory deficits  LABORATORY DATA:  I have reviewed the data as listed . CBC Latest Ref Rng & Units 01/05/2017 12/07/2016 11/11/2016  WBC 4.0 - 10.3 10e3/uL 5.5 8.2 5.8  Hemoglobin 13.0 - 17.1 g/dL 10.0(L) 9.5(L) 9.2(L)  Hematocrit 38.4 - 49.9 % 32.2(L) 30.5(L) 28.0(L)  Platelets 140 - 400 10e3/uL 190 191 163   . CMP Latest Ref Rng & Units 01/05/2017 12/07/2016 11/11/2016  Glucose 70 - 140 mg/dl 121 166(H) 165(H)  BUN 7.0 - 26.0 mg/dL 37.7(H) 29.7(H) 23.2  Creatinine 0.7 - 1.3 mg/dL 1.9(H) 1.7(H) 1.7(H)  Sodium 136 - 145 mEq/L 140 139 140  Potassium 3.5 - 5.1 mEq/L 4.3  4.1 4.1  Chloride 101 - 111 mmol/L - - -  CO2 22 - 29 mEq/L 22 21(L) 25  Calcium 8.4 - 10.4 mg/dL 9.2 8.8 8.9  Total Protein 6.4 - 8.3 g/dL 6.4 6.2(L) 6.1(L)  Total Bilirubin 0.20 - 1.20 mg/dL 0.34 0.34 0.36  Alkaline Phos 40 - 150 U/L 67 75 78  AST 5 - 34 U/L 16 15 12   ALT 0 - 55 U/L 16 18 20     . Lab Results  Component Value Date   LDH 180 01/05/2017     RADIOGRAPHIC STUDIES: I have personally reviewed the radiological images as listed and agreed with the findings in the report.  Nm Pet Image Restag (ps) Skull Base To Thigh  Result Date: 10/22/2016 CLINICAL DATA:  Subsequent treatment strategy for diffuse large B-cell non-Hodgkin's lymphoma. EXAM: NUCLEAR MEDICINE PET SKULL BASE TO THIGH TECHNIQUE: 11.15 mCi F-18 FDG was injected intravenously. Full-ring PET imaging was performed from the skull base to thigh after the radiotracer. CT data was obtained and used for attenuation correction and anatomic localization. FASTING BLOOD GLUCOSE:  Value: 102 mg/dl COMPARISON:  None. FINDINGS: NECK No hypermetabolic lymph nodes in the neck. CHEST The FDG avid right axillary lymph nodes and epicardial lymph nodes have resolved in the interval. The focal hypermetabolic activity in the left ventricular myocardium described on the previous study is unchanged. No new abnormalities in the chest. ABDOMEN/PELVIS Significant improvement in the abdomen. No definitive FDG avid disease remains in the liver or spleen. The adenopathy in the abdomen has resolved. Minimal uptake in the normal appearing left adrenal gland is of no significance. No abnormal uptake seen in the soft tissues of the pelvis. The omental nodularity seen previously has resolved. There is increased attenuation in the fat associated with the anterior ventral hernia with low level uptake, likely inflammatory. The maximum SUV is measured on image 161 is 3.7. SKELETON There has been significant overall improvement in the bones as well. Uptake in the  anterior left acetabulum has significantly diminished with a maximum SUV of 3.4 today versus 29.4 previously. The left iliac uptake seen previously is no longer significantly FDG avid. There is mild focal uptake in the posterior left iliac bone on series 4, image 166 which was not seen previously and demonstrates a maximum SUV of 4.26 today. No other bony abnormalities are identified. IMPRESSION: 1. Significant interval improvement. No FDG avid disease remains in the soft tissues of the neck, chest, abdomen, or pelvis. Very mild uptake remains in the known left anterior acetabular lesion. Mild new uptake in the posterior left iliac bone is nonspecific but could represent mild involvement. Recommend attention on follow-up. 2. Increased attenuation and mild uptake in the fat of a lower midline ventral hernia is likely inflammatory. Recommend attention on follow-up. 3. No other interval change. Electronically Signed   By: Dorise Bullion III M.D   On: 10/22/2016 15:33   PET/CT 08/06/2016: IMPRESSION: 1. In addition to hypermetabolic nodal involvement of the right axilla, pericardial space, porta hepatis/mesenteric root, and retroperitoneum, there is extensive involvement of the liver as well as peritoneal involvement along the liver surface, splenic surface, and upper omentum. 2. Hypermetabolic bony lesions in the left pelvis compatible with malignant involvement. 3. There is a focus of hypermetabolic activity along the anterolateral wall of the left ventricle. Normally such findings turn out to simply be hypermetabolic myocardium. Strictly speaking given how focal this appears I cannot completely exclude the possibility of tumor involvement within or along the myocardium.   Electronically Signed   By: Van Clines M.D.   On: 08/06/2016 12:48    .Nm  Pet Image Restag (ps) Skull Base To Thigh  Result Date: 12/29/2016 CLINICAL DATA:  Subcu treatment strategy for restaging of B-cell  lymphoma. EXAM: NUCLEAR MEDICINE PET SKULL BASE TO THIGH TECHNIQUE: 10189 mCi F-18 FDG was injected intravenously. Full-ring PET imaging was performed from the skull base to thigh after the radiotracer. CT data was obtained and used for attenuation correction and anatomic localization. FASTING BLOOD GLUCOSE:  Value: 110 mg/dl COMPARISON:  PET 10/22/2016.  Abdominopelvic CT of 11/04/2016. FINDINGS: NECK: No areas of abnormal hypermetabolism. Bilateral carotid atherosclerosis. No cervical adenopathy. Surgical changes about the left globe. CHEST: No pulmonary parenchymal or nodal hypermetabolism within the chest. Again identified is left ventricular free wall hypermetabolism. No well-defined mass in this area. Mild cardiomegaly with prior median sternotomy for CABG. No thoracic adenopathy. A right-sided Port-A-Cath which terminates at the high right atrium. Posterior right upper lobe 3 mm nodule is unchanged on image 29/series 8. ABDOMEN/PELVIS: No abdominopelvic parenchymal or nodal hypermetabolism. Low-level hypermetabolism about the anterior abdominal wall laparotomy site is similar, likely postoperative. Abdominal aortic atherosclerosis. Normal adrenal glands. Mild renal cortical thinning bilaterally. Low-density bilateral renal lesions are likely cysts. Non aneurysmal infrarenal aortic dilatation is similar, including at 3.1 cm. Bilateral common iliac artery aneurysms, larger on the left at 2.3 cm. Grossly similar. The left internal iliac is also moderately dilated. No abdominopelvic adenopathy. SKELETON: Left pelvic hypermetabolism has resolved. likely degenerative hypermetabolism about the medial left clavicle. Bilateral L5 pars defects. IMPRESSION: 1. No evidence of residual or recurrent hypermetabolic lymphoma. 2.  Aortic Atherosclerosis (ICD10-I70.0). 3. Dilatation of the infrarenal aorta and pelvic vasculature, as detailed above. Electronically Signed   By: Abigail Miyamoto M.D.   On: 12/29/2016 15:17     ASSESSMENT & PLAN:   81 yo male with multiple medical co-morbidities including cardiac co-morbidities, IPF, vascular on chronic immunosupression with   1)Stage IV Diffuse large B-cell lymphoma involving the small intestine (germinal cell phenotype) with complete thickness involvement of bowel and abutting serosa.  Initial PET/CT scan shows extensive involvement with DLBCL, hypermetabolic nodal involvement of the right axilla, pericardial space, porta hepatis/mesenteric root, and retroperitoneum, there is extensive involvement of the liver as well as peritoneal involvement along the liver surface, splenic surface, and upper omentum. 2. Hypermetabolic bony lesions in the left pelvis compatible with malignant involvement. 3. There is a focus of hypermetabolic activity along the anterolateral wall of the left ventricle.   s/p 6 cycles of R-CEOP . T  PET/CT scan on 10/22/2016 after 3 cycles of chemotherapy shows excellent response to treatment. PET/CT scan on 12/29/2016 after 6 cycles of treatment shows No evidence of residual or recurrent hypermetabolic lymphoma  A)Status post neutropenic fever and E.coli/pseudomonal bacteremia/HCAP after the first cycle of treatment.  Significant chemotherapy related thrombocytopenia - now resolved. B) Recent hospitalization for perforation diverticulitis - symptoms completely resolved.   2) chronic kidney disease stage III baseline creatinine about 2. Renal function stable . 3) coronary disease status post PCI and CABG - had a myocardial perfusion imaging scan on 06/30/2016 that showed a moderately decreased ejection fraction of 30-44%- this precludes anthracycline use currently. 4) history of pulmonary and renal vasculitis previously on chronic Imuran. Now off Imuran considering chemotherapy and Rituxan use.  5) Neutropenia -resolved 6) thrombocytopenia due to chemotherapy -resolved  Plan -labs are stable. -PET/CT images and results reviewed in details  with the patient and his wife. He is understandably glad that the imaging shows he has achieved CR status. -no indication for additional lymphoma  treatment at this time. -we will schedule him to have his port-a-cath removed with IR (request placed) -f/u with PCP .Luke Heinz, Mcbride of continued mx of other medical co-morbidities and to determine need to go back to Imuran for his vasculitis.  Port-a-cath removal ASAP with IR RTC with Luke Irene Limbo in 2 months with labs  All of the patients questions were answered with apparent satisfaction. The patient knows to call the clinic with any problems, questions or concerns.  I spent 20 minutes counseling the patient face to face. The total time spent in the appointment was 25 minutes and more than 50% was on counseling and direct patient cares.    Sullivan Lone Mcbride [place] AAHIVMS [facility] Hematology/Oncology Physician [facility]  (Office):       [phone] (Work cell):  [phone] (Fax):           [phone]

## 2017-01-05 NOTE — Patient Instructions (Signed)
Thank you for choosing [facility] to provide your oncology and hematology care.  To afford each patient quality time with our providers, please arrive 30 minutes before your scheduled appointment time.  If you arrive late for your appointment, you may be asked to reschedule.  We strive to give you quality time with our providers, and arriving late affects you and other patients whose appointments are after yours.   If you are a no show for multiple scheduled visits, you may be dismissed from the clinic at the providers discretion.    Again, thank you for choosing [facility], our hope is that these requests will decrease the amount of time that you wait before being seen by our physicians.  ______________________________________________________________________  Should you have questions after your visit to the [facility], please contact our office at [phone] between the hours of 8:30 and 4:30 p.m.    Voicemails left after 4:30p.m will not be returned until the following business day.    For prescription refill requests, please have your pharmacy contact us directly.  Please also try to allow 48 hours for prescription requests.    Please contact the scheduling department for questions regarding scheduling.  For scheduling of procedures such as PET scans, CT scans, MRI, Ultrasound, etc please contact central scheduling at [phone].    Resources For Cancer Patients and Caregivers:   Oncolink.org:  A wonderful resource for patients and healthcare providers for information regarding your disease, ways to tract your treatment, what to expect, etc.     American Cancer Society:  800-227-2345  Can help patients locate various types of support and financial assistance  Cancer Care: 1-800-813-HOPE (4673) Provides financial assistance, online support groups, medication/co-pay assistance.    [place] DSS:  336-641-3447 Where to apply for food  stamps, Medicaid, and utility assistance  Medicare Rights Center: 800-333-4114 Helps people with Medicare understand their rights and benefits, navigate the Medicare system, and secure the quality healthcare they deserve  SCAT: 336-333-6589 [place] [employer]'s shared-ride transportation service for eligible riders who have a disability that prevents them from riding the fixed route bus.    For additional information on assistance programs please contact our social worker:   Grier Hock/Abigail Elmore:  [phone]            

## 2017-01-05 NOTE — Telephone Encounter (Signed)
Gave patient avs and calendar for appts.  °

## 2017-01-10 ENCOUNTER — Ambulatory Visit (HOSPITAL_COMMUNITY): Payer: Medicare Other | Attending: Cardiology

## 2017-01-10 ENCOUNTER — Other Ambulatory Visit: Payer: Self-pay

## 2017-01-10 DIAGNOSIS — C8510 Unspecified B-cell lymphoma, unspecified site: Secondary | ICD-10-CM | POA: Insufficient documentation

## 2017-01-10 DIAGNOSIS — R0609 Other forms of dyspnea: Secondary | ICD-10-CM | POA: Diagnosis not present

## 2017-01-10 DIAGNOSIS — I517 Cardiomegaly: Secondary | ICD-10-CM | POA: Diagnosis not present

## 2017-01-10 DIAGNOSIS — E1122 Type 2 diabetes mellitus with diabetic chronic kidney disease: Secondary | ICD-10-CM | POA: Insufficient documentation

## 2017-01-10 DIAGNOSIS — E785 Hyperlipidemia, unspecified: Secondary | ICD-10-CM | POA: Diagnosis not present

## 2017-01-10 DIAGNOSIS — I129 Hypertensive chronic kidney disease with stage 1 through stage 4 chronic kidney disease, or unspecified chronic kidney disease: Secondary | ICD-10-CM | POA: Insufficient documentation

## 2017-01-10 DIAGNOSIS — D649 Anemia, unspecified: Secondary | ICD-10-CM | POA: Diagnosis not present

## 2017-01-10 DIAGNOSIS — N189 Chronic kidney disease, unspecified: Secondary | ICD-10-CM | POA: Diagnosis not present

## 2017-01-10 DIAGNOSIS — I252 Old myocardial infarction: Secondary | ICD-10-CM | POA: Diagnosis not present

## 2017-01-10 LAB — ECHOCARDIOGRAM COMPLETE
AOASC: 37 cm
E decel time: 74 msec
EERAT: 9.41
FS: 20 % — AB (ref 28–44)
IV/PV OW: 1.14
LA diam end sys: 50 mm
LA diam index: 2.2 cm/m2
LA vol A4C: 81.1 ml
LA vol index: 35.5 mL/m2
LASIZE: 50 mm
LAVOL: 80.5 mL
LV E/e' medial: 9.41
LV e' LATERAL: 5.98 cm/s
LVEEAVG: 9.41
LVOT VTI: 14.9 cm
LVOT area: 4.15 cm2
LVOT diameter: 23 mm
LVOT peak vel: 73.9 cm/s
LVOTSV: 62 mL
Lateral S' vel: 7.29 cm/s
MV Dec: 74
MV pk E vel: 56.3 m/s
MVPKAVEL: 86.3 m/s
PW: 10.1 mm — AB (ref 0.6–1.1)
RV TAPSE: 10.7 mm
TDI e' lateral: 5.98
TDI e' medial: 5.66

## 2017-01-12 ENCOUNTER — Telehealth: Payer: Self-pay | Admitting: Cardiology

## 2017-01-12 DIAGNOSIS — R9430 Abnormal result of cardiovascular function study, unspecified: Secondary | ICD-10-CM

## 2017-01-12 DIAGNOSIS — I2510 Atherosclerotic heart disease of native coronary artery without angina pectoris: Secondary | ICD-10-CM

## 2017-01-12 DIAGNOSIS — I2583 Coronary atherosclerosis due to lipid rich plaque: Secondary | ICD-10-CM

## 2017-01-12 NOTE — Telephone Encounter (Signed)
-----   Message from [place], [place] sent at 01/11/2017  5:45 PM EDT ----- Cardiac ultrasound shows reduced pump function compared to previous echo. EF down from 55% to 40-45%. It has been recommended by reading cardiologist that he get a limited repeat study (echo) with definity contrast to assess wall motion and more accurate assessment of EF. Please notify patient and order repeat limited study with contrast. When he comes in for echo also order a BNP and BMP. Have patient f/u several days after echo with APP or Dr. [name].

## 2017-01-12 NOTE — Telephone Encounter (Signed)
Pt has been made aware of his echo results. We have ordered a limited echo with contrast bnp bmet Pt understands this will be scheduled and he will be contacted back with all the appts. He thanked me for the call and verbalized understanding.

## 2017-01-12 NOTE — Telephone Encounter (Signed)
New message    Pt is returning call for echo results

## 2017-01-18 ENCOUNTER — Ambulatory Visit (HOSPITAL_COMMUNITY): Payer: Medicare Other | Attending: Cardiology

## 2017-01-18 ENCOUNTER — Other Ambulatory Visit: Payer: Self-pay

## 2017-01-18 DIAGNOSIS — I2583 Coronary atherosclerosis due to lipid rich plaque: Secondary | ICD-10-CM

## 2017-01-18 DIAGNOSIS — D649 Anemia, unspecified: Secondary | ICD-10-CM | POA: Insufficient documentation

## 2017-01-18 DIAGNOSIS — E1122 Type 2 diabetes mellitus with diabetic chronic kidney disease: Secondary | ICD-10-CM | POA: Diagnosis not present

## 2017-01-18 DIAGNOSIS — C8590 Non-Hodgkin lymphoma, unspecified, unspecified site: Secondary | ICD-10-CM | POA: Insufficient documentation

## 2017-01-18 DIAGNOSIS — E785 Hyperlipidemia, unspecified: Secondary | ICD-10-CM | POA: Insufficient documentation

## 2017-01-18 DIAGNOSIS — I340 Nonrheumatic mitral (valve) insufficiency: Secondary | ICD-10-CM | POA: Insufficient documentation

## 2017-01-18 DIAGNOSIS — I2510 Atherosclerotic heart disease of native coronary artery without angina pectoris: Secondary | ICD-10-CM | POA: Diagnosis not present

## 2017-01-18 DIAGNOSIS — N189 Chronic kidney disease, unspecified: Secondary | ICD-10-CM | POA: Insufficient documentation

## 2017-01-18 DIAGNOSIS — I252 Old myocardial infarction: Secondary | ICD-10-CM | POA: Diagnosis not present

## 2017-01-18 DIAGNOSIS — I253 Aneurysm of heart: Secondary | ICD-10-CM | POA: Insufficient documentation

## 2017-01-18 DIAGNOSIS — I129 Hypertensive chronic kidney disease with stage 1 through stage 4 chronic kidney disease, or unspecified chronic kidney disease: Secondary | ICD-10-CM | POA: Insufficient documentation

## 2017-01-18 MED ORDER — PERFLUTREN LIPID MICROSPHERE
1.0000 mL | INTRAVENOUS | Status: AC | PRN
  Administered 2017-01-18: 2 mL via INTRAVENOUS

## 2017-01-19 ENCOUNTER — Encounter: Payer: Self-pay | Admitting: *Deleted

## 2017-01-19 ENCOUNTER — Other Ambulatory Visit: Payer: Self-pay | Admitting: Radiology

## 2017-01-20 ENCOUNTER — Ambulatory Visit (HOSPITAL_COMMUNITY)
Admission: RE | Admit: 2017-01-20 | Discharge: 2017-01-20 | Disposition: A | Discharge status: Home / Self Care | Payer: Medicare Other | Source: Ambulatory Visit | Attending: Hematology | Admitting: Hematology

## 2017-01-20 ENCOUNTER — Other Ambulatory Visit: Payer: Self-pay | Admitting: Radiology

## 2017-01-20 ENCOUNTER — Encounter (HOSPITAL_COMMUNITY): Payer: Self-pay

## 2017-01-20 DIAGNOSIS — Z95828 Presence of other vascular implants and grafts: Secondary | ICD-10-CM

## 2017-01-20 DIAGNOSIS — I129 Hypertensive chronic kidney disease with stage 1 through stage 4 chronic kidney disease, or unspecified chronic kidney disease: Secondary | ICD-10-CM | POA: Insufficient documentation

## 2017-01-20 DIAGNOSIS — I2510 Atherosclerotic heart disease of native coronary artery without angina pectoris: Secondary | ICD-10-CM | POA: Diagnosis not present

## 2017-01-20 DIAGNOSIS — C8190 Hodgkin lymphoma, unspecified, unspecified site: Secondary | ICD-10-CM | POA: Diagnosis not present

## 2017-01-20 DIAGNOSIS — C8338 Diffuse large B-cell lymphoma, lymph nodes of multiple sites: Secondary | ICD-10-CM

## 2017-01-20 DIAGNOSIS — Z7982 Long term (current) use of aspirin: Secondary | ICD-10-CM | POA: Insufficient documentation

## 2017-01-20 DIAGNOSIS — Z955 Presence of coronary angioplasty implant and graft: Secondary | ICD-10-CM | POA: Insufficient documentation

## 2017-01-20 DIAGNOSIS — Z951 Presence of aortocoronary bypass graft: Secondary | ICD-10-CM | POA: Diagnosis not present

## 2017-01-20 DIAGNOSIS — N183 Chronic kidney disease, stage 3 (moderate): Secondary | ICD-10-CM | POA: Insufficient documentation

## 2017-01-20 DIAGNOSIS — I252 Old myocardial infarction: Secondary | ICD-10-CM | POA: Insufficient documentation

## 2017-01-20 DIAGNOSIS — Z452 Encounter for adjustment and management of vascular access device: Secondary | ICD-10-CM | POA: Insufficient documentation

## 2017-01-20 DIAGNOSIS — Z7902 Long term (current) use of antithrombotics/antiplatelets: Secondary | ICD-10-CM | POA: Diagnosis not present

## 2017-01-20 DIAGNOSIS — E785 Hyperlipidemia, unspecified: Secondary | ICD-10-CM | POA: Insufficient documentation

## 2017-01-20 DIAGNOSIS — E039 Hypothyroidism, unspecified: Secondary | ICD-10-CM | POA: Diagnosis not present

## 2017-01-20 DIAGNOSIS — H409 Unspecified glaucoma: Secondary | ICD-10-CM | POA: Diagnosis not present

## 2017-01-20 DIAGNOSIS — K219 Gastro-esophageal reflux disease without esophagitis: Secondary | ICD-10-CM | POA: Insufficient documentation

## 2017-01-20 HISTORY — PX: IR REMOVAL TUN ACCESS W/ PORT W/O FL MOD SED: IMG2290

## 2017-01-20 LAB — BASIC METABOLIC PANEL
Anion gap: 10 (ref 5–15)
BUN: 34 mg/dL — AB (ref 6–20)
CO2: 21 mmol/L — ABNORMAL LOW (ref 22–32)
CREATININE: 1.88 mg/dL — AB (ref 0.61–1.24)
Calcium: 9 mg/dL (ref 8.9–10.3)
Chloride: 108 mmol/L (ref 101–111)
GFR calc Af Amer: 38 mL/min — ABNORMAL LOW (ref >60)
GFR calc non Af Amer: 32 mL/min — ABNORMAL LOW (ref >60)
GLUCOSE: 144 mg/dL — AB (ref 65–99)
POTASSIUM: 4.2 mmol/L (ref 3.5–5.1)
Sodium: 139 mmol/L (ref 135–145)

## 2017-01-20 LAB — CBC
HCT: 34.2 % — ABNORMAL LOW (ref 39.0–52.0)
Hemoglobin: 10.8 g/dL — ABNORMAL LOW (ref 13.0–17.0)
MCH: 32 pg (ref 26.0–34.0)
MCHC: 31.6 g/dL (ref 30.0–36.0)
MCV: 101.5 fL — ABNORMAL HIGH (ref 78.0–100.0)
PLATELETS: 204 10*3/uL (ref 150–400)
RBC: 3.37 MIL/uL — ABNORMAL LOW (ref 4.22–5.81)
RDW: 17 % — AB (ref 11.5–15.5)
WBC: 7.6 10*3/uL (ref 4.0–10.5)

## 2017-01-20 LAB — PROTIME-INR
INR: 0.99
Prothrombin Time: 13 seconds (ref 11.4–15.2)

## 2017-01-20 LAB — APTT: APTT: 30 s (ref 24–36)

## 2017-01-20 MED ORDER — FENTANYL CITRATE (PF) 100 MCG/2ML IJ SOLN
INTRAMUSCULAR | Status: AC | PRN
  Administered 2017-01-20: 50 ug via INTRAVENOUS

## 2017-01-20 MED ORDER — CEFAZOLIN SODIUM-DEXTROSE 2-4 GM/100ML-% IV SOLN
2.0000 g | INTRAVENOUS | Status: AC
  Administered 2017-01-20: 2 g via INTRAVENOUS

## 2017-01-20 MED ORDER — SODIUM CHLORIDE 0.9 % IV SOLN
INTRAVENOUS | Status: DC
  Administered 2017-01-20: 10:00:00 via INTRAVENOUS

## 2017-01-20 MED ORDER — MIDAZOLAM HCL 2 MG/2ML IJ SOLN
INTRAMUSCULAR | Status: AC
  Filled 2017-01-20: qty 4

## 2017-01-20 MED ORDER — FENTANYL CITRATE (PF) 100 MCG/2ML IJ SOLN
INTRAMUSCULAR | Status: AC
  Filled 2017-01-20: qty 4

## 2017-01-20 MED ORDER — LIDOCAINE HCL (PF) 1 % IJ SOLN
INTRAMUSCULAR | Status: AC
  Filled 2017-01-20: qty 30

## 2017-01-20 MED ORDER — CEFAZOLIN SODIUM-DEXTROSE 2-4 GM/100ML-% IV SOLN
INTRAVENOUS | Status: AC
  Filled 2017-01-20: qty 100

## 2017-01-20 MED ORDER — MIDAZOLAM HCL 2 MG/2ML IJ SOLN
INTRAMUSCULAR | Status: AC | PRN
  Administered 2017-01-20: 1 mg via INTRAVENOUS

## 2017-01-20 NOTE — H&P (Signed)
Referring Physician(s): Brunetta Genera  Supervising Physician: Marybelle Killings  Patient Status:  [facility]  Chief Complaint:  "I'm getting my port out"  Subjective: Patient familiar to IR service from prior right chest wall H[place]man cath placement in 2009 and right Port-A-Cath placement on 08/20/16. He has a history of diffuse large B-cell lymphoma of small intestine, status post treatment. Recent PET scan reveals no evidence of residual disease and he presents today for Port-A-Cath removal. He currently denies fever, headache, chest pain, abdominal/back pain, nausea, vomiting or abnormal bleeding. He does have occasional cough and some dyspnea with exertion.  Past Medical History:  Diagnosis Date  . ALLERGIC RHINITIS   . Anemia    hx  . Barrett's esophagus   . BOOP (bronchiolitis obliterans with organizing pneumonia) ([place])    a. s/p R VATS 2008.  [name] CAD (coronary artery disease)    a. 05/2000: NSTEMI/CABG x 6: LIMA->LAD, VG->D1, VG->OM1->2, VG->PDA->RPL;  b. 07/2007 MV: high lat infarct, no ischemia, EF 47%;  c. Cath/PCI: LM nl, LAD20p, 31m, D1 nl, D2 60-70ost, D3 nl, LCX 70ost, 141m, OM1/OM2 min irregs, RCA 30 diff, PDA 99, LIMA->LAD atretic, VG->D1 100, VG->OM1->2 100, VG->PDA->RPL 90p (4.0x23 Vision BMS);  c. 07/2012 Echo: EF 55%, gr1 DD.  [name] Cataract   . CKD (chronic kidney disease), stage III    a. renal bx 2008: GLN with vasculitis  . Diffuse large B cell lymphoma ([place]) 08/07/2016  . GERD (gastroesophageal reflux disease)   . Glaucoma    a. Cannot see out of L eye.  . Hematuria    Microscopic  . Hyperglycemia    Patient reported while on prednisone, had to take insulin  . Hyperlipemia   . Hypertension   . Hypothyroidism   . ILD (interstitial lung disease) ([place])   . Iritis   . Local infection of skin and subcutaneous tissue   . Membranoproliferative nephritis   . Myocardial infarction ([place]) 2002  . Neutropenia, drug-induced ([place])   . Recurrent boils   . Residual foreign  body in soft tissue   . Vasculitis ([place])    a.  pauciimmune vasculitis with renal involvement and hx of transient hemoptysis in the past with associated BOOP, 2008 (renal bx 2008: GLN with vasculitis). b. History of treatment with 2 cycles of Cytoxan and pheresis. H/o hemoptysis and pulm hemorrhage with 2nd cycle of cytoxan.   Past Surgical History:  Procedure Laterality Date  . BOWEL RESECTION N/A 07/12/2016   Procedure: SMALL BOWEL RESECTION;  Surgeon: Rolm Bookbinder, MD;  Location: [facility];  Service: General;  Laterality: N/A;  . CATARACT EXTRACTION Bilateral   . CORONARY ARTERY BYPASS GRAFT  2002  . IR FLUORO GUIDE PORT INSERTION RIGHT  08/20/2016  . IR US GUIDE VASC ACCESS RIGHT  08/20/2016  . LAPAROSCOPY N/A 07/12/2016   Procedure: LAPAROSCOPY DIAGNOSTIC;  Surgeon: Rolm Bookbinder, MD;  Location: [facility];  Service: General;  Laterality: N/A;  . LEFT HEART CATHETERIZATION WITH CORONARY/GRAFT ANGIOGRAM N/A 07/26/2012   Procedure: LEFT HEART CATHETERIZATION WITH Beatrix Fetters;  Surgeon: Wellington Hampshire, MD;  Location: [facility] CATH [facility];  Service: Cardiovascular;  Laterality: N/A;  . LUNG BIOPSY    . PERCUTANEOUS CORONARY STENT INTERVENTION (PCI-S)  07/26/2012   Procedure: PERCUTANEOUS CORONARY STENT INTERVENTION (PCI-S);  Surgeon: Wellington Hampshire, MD;  Location[facility];  Service: Cardiovascular;;  . RENAL BIOPSY    . TOE AMPUTATION  2009   hammer toe     Allergies: No known  allergies  Medications: Prior to Admission medications   Medication Sig Start Date End Date Taking? Authorizing Provider  acetaminophen (TYLENOL) 500 MG tablet Take 500-1,000 mg by mouth daily as needed for moderate pain or headache.    [provider]  aspirin EC 81 MG EC tablet Take 1 tablet (81 mg total) by mouth daily. 07/29/12   Rogelia Mire, NP  clopidogrel (PLAVIX) 75 MG tablet Take 1 tablet (75 mg total) by mouth daily. Please call and schedule a one year follow up appointment for  October 11/29/16   [name], MD  finasteride (PROSCAR) 5 MG tablet Take 5 mg by mouth at bedtime.     [provider]  furosemide (LASIX) 20 MG tablet Take 20 mg by mouth daily. 09/20/16   [provider]  levothyroxine (SYNTHROID, LEVOTHROID) 100 MCG tablet Take 100 mcg by mouth daily.      [provider]  lidocaine-prilocaine (EMLA) cream Apply to affected area once 08/13/16   Brunetta Genera, MD  metoprolol tartrate (LOPRESSOR) 25 MG tablet TAKE ONE-HALF TABLET BY  MOUTH TWO TIMES DAILY 10/05/16   [name], MD  Multiple Vitamin (MULTIVITAMIN) tablet Take 1 tablet by mouth daily.      [provider]  NITROSTAT 0.4 MG SL tablet PLACE 1 TABLET (0.4 MG TOTAL) UNDER THE TONGUE EVERY 5 (FIVE) MINUTES AS NEEDED. MAY REPEAT X3 Patient taking differently: Place 0.4 mg under the tongue every 5 (five) minutes as needed for chest pain. May repeat x3 02/10/16   [name], MD  pantoprazole (PROTONIX) 40 MG tablet TAKE 1 TABLET BY MOUTH  DAILY 09/23/16   [name], MD  Tamsulosin HCl (FLOMAX) 0.4 MG CAPS Take 0.4 mg by mouth at bedtime.     [provider]  telmisartan (MICARDIS) 40 MG tablet Take 20 mg by mouth daily.     [provider]     Vital Signs: pend   Physical Exam awake, alert. Chest clear to auscultation bilaterally. Heart with tachycardia but regular rhythm. Abdomen obese, soft, positive bowel sounds, nontender. Lower extremities with 2+ edema bilaterally  Imaging: No results found.  Labs:  CBC:  Recent Labs  11/04/16 0339 11/11/16 0915 12/07/16 0748 01/05/17 0734  WBC 4.5 5.8 8.2 5.5  HGB 8.8* 9.2* 9.5* 10.0*  HCT 27.0* 28.0* 30.5* 32.2*  PLT 91* 163 191 190    COAGS:  Recent Labs  07/09/16 0930 08/20/16 1148 09/01/16 2300  INR 1.01 0.97 1.22  APTT  --  29 43*    BMP:  Recent Labs  11/02/16 0544 11/03/16 0503 11/04/16 0339 11/05/16 0434 11/11/16 0915 12/07/16 0748  01/05/17 0734  NA 138 138 136 138 140 139 140  K 4.3 4.3 4.3 4.1 4.1 4.1 4.3  CL 108 106 105 108  --   --   --   CO2 24 24 23  21* 25 21* 22  GLUCOSE 115* 107* 147* 124* 165* 166* 121  BUN 40* 30* 27* 20 23.2 29.7* 37.7*  CALCIUM 8.3* 8.5* 8.1* 8.0* 8.9 8.8 9.2  CREATININE 1.64* 1.63* 1.82* 1.76* 1.7* 1.7* 1.9*  GFRNONAA 38* 38* 34* 35*  --   --   --   GFRAA 44* 45* 39* 41*  --   --   --     LIVER FUNCTION TESTS:  Recent Labs  10/30/16 1043 11/11/16 0915 12/07/16 0748 01/05/17 0734  BILITOT 0.6 0.36 0.34 0.34  AST 27 12 15  16  ALT 32 20 18 16   ALKPHOS 59 78 75 67  PROT 5.4* 6.1* 6.2* 6.4  ALBUMIN 3.1* 3.0* 3.2* 3.3*    Assessment and Plan: Pt with recent history of diffuse large B-cell lymphoma of small intestine, status post treatment. Recent PET scan reveals no evidence of residual disease and he presents today for Port-A-Cath removal. Details/risks of procedure, including but not limited to, internal bleeding, infection, injury to adjacent structures, discussed with patient/spouse with his understanding and consent.    Electronically Signed: D. Rowe Robert, PA-C 01/20/2017, 9:45 AM   I spent a total of 20 minutes  at the the patient's bedside AND on the patient's hospital floor or unit, greater than 50% of which was counseling/coordinating care for Port-A-Cath removal

## 2017-01-20 NOTE — Procedures (Signed)
RIJV PAC explant EBL 0 Comp 0

## 2017-01-21 ENCOUNTER — Ambulatory Visit (INDEPENDENT_AMBULATORY_CARE_PROVIDER_SITE_OTHER): Payer: Medicare Other | Admitting: Cardiology

## 2017-01-21 ENCOUNTER — Encounter: Payer: Self-pay | Admitting: Cardiology

## 2017-01-21 VITALS — BP 130/74 | HR 90 | Ht 73.0 in | Wt 224.4 lb

## 2017-01-21 DIAGNOSIS — I2583 Coronary atherosclerosis due to lipid rich plaque: Secondary | ICD-10-CM

## 2017-01-21 DIAGNOSIS — R0609 Other forms of dyspnea: Secondary | ICD-10-CM

## 2017-01-21 DIAGNOSIS — I2510 Atherosclerotic heart disease of native coronary artery without angina pectoris: Secondary | ICD-10-CM

## 2017-01-21 LAB — PRO B NATRIURETIC PEPTIDE: NT-Pro BNP: 880 pg/mL — ABNORMAL HIGH (ref 0–486)

## 2017-01-21 MED ORDER — CLOPIDOGREL BISULFATE 75 MG PO TABS: 75.0000 mg | ORAL_TABLET | Freq: Every day | ORAL | 1 refills | Status: DC

## 2017-01-21 NOTE — Patient Instructions (Addendum)
Medication Instructions: Your physician recommends that you continue on your current medications as directed. Please refer to the Current Medication list given to you today.  Labwork: Your physician recommends that you have lab work today: BNP  Procedures/Testing: None Ordered  Follow-Up: Your physician recommends that you  follow-up appointment on Tuesday April 19, 2017 at 10:00 am with Dr. [name]    If you need a refill on your cardiac medications before your next appointment, please call your pharmacy.

## 2017-01-21 NOTE — Progress Notes (Signed)
01/21/2017 Luke Mcbride   [birth year]  [id number]  Primary Physician Donato Heinz, MD Primary Cardiologist: Dr. [name]   Reason for Visit/CC: F/u for Systolic HF  HPI:  Luke Mcbride is a 81 y.o. male w/ h/o CAD. He has been followed by Dr. [name]. He had CABG in 2002. He was admitted 07/2012 with NSTEMI and underwent BMS to the SVR-PDA/PL. His LIMA to LAD was atretic but disease in the LAD was moderate. SVG-OM and SVG to diagonal were known to be occluded. Other PMH includes DM, HTN, HLD and CKD.   Most recently, in February, he was diagnosed with Stage IV Diffuse large B-cell lymphoma involving the small intestine. He required surgery.  Prior to surgery, he had a nuclear stress test on 07/01/16 which showed scar but no ischemia. He was cleared to undergo surgery. He had 6 inches of his colon resected. Following surgery he had 6 rounds of chemo. He recently had a f/u PET scan and was told he was in remission.   I evaluated him several weeks ago on 01/03/17. From a cardiac standpoint, he denies any CP. He noted however mild exertional dyspnea and mild LEE but denied orthopnea and PND. No dizziness, palpitations, syncope/ near syncope. BP was well controlled.  Given his new dyspnea and fact that he had recent chemo, we ordered a 2D echo to reassess LVF. This showed decreased LVEF, down to 35-40%, previously 55%.  He presents back to clinic today for f/u. He continues to have some exertional dyspnea, but none at rest. Also no orthopnea/ PND or LEE. He is on lasix 20 mg daily and reports full compliance. He denies CP.   His stress test from 06/2016 was reviewed. EF measurement at that time was 30-44%.   Current Meds  Medication Sig  . acetaminophen (TYLENOL) 500 MG tablet Take 500-1,000 mg by mouth daily as needed for moderate pain or headache.  [name] aspirin EC 81 MG EC tablet Take 1 tablet (81 mg total) by mouth daily.  . clopidogrel (PLAVIX) 75 MG tablet Take 1 tablet (75 mg total) by  mouth daily. Please call and schedule a one year follow up appointment for October  . finasteride (PROSCAR) 5 MG tablet Take 5 mg by mouth at bedtime.   . furosemide (LASIX) 20 MG tablet Take 20 mg by mouth daily.  [name] levothyroxine (SYNTHROID, LEVOTHROID) 100 MCG tablet Take 100 mcg by mouth daily.    [name] lidocaine-prilocaine (EMLA) cream Apply 1 application topically as directed.  . metoprolol tartrate (LOPRESSOR) 25 MG tablet TAKE ONE-HALF TABLET BY  MOUTH TWO TIMES DAILY  . Multiple Vitamin (MULTIVITAMIN) tablet Take 1 tablet by mouth daily.    . nitroGLYCERIN (NITROSTAT) 0.4 MG SL tablet Place 0.4 mg under the tongue every 5 (five) minutes as needed for chest pain.  . pantoprazole (PROTONIX) 40 MG tablet TAKE 1 TABLET BY MOUTH  DAILY  . Tamsulosin HCl (FLOMAX) 0.4 MG CAPS Take 0.4 mg by mouth at bedtime.   [name] telmisartan (MICARDIS) 40 MG tablet Take 20 mg by mouth daily.    Allergies  Allergen Reactions  . No Known Allergies    Past Medical History:  Diagnosis Date  . ALLERGIC RHINITIS   . Anemia    hx  . Barrett's esophagus   . BOOP (bronchiolitis obliterans with organizing pneumonia) ([place])    a. s/p R VATS 2008.  [name] CAD (coronary artery disease)    a. 05/2000: NSTEMI/CABG x 6: LIMA->LAD, VG->D1, VG->OM1->2, VG->PDA->RPL;  b. 07/2007 MV: high lat infarct, no ischemia, EF 47%;  c. Cath/PCI: LM nl, LAD20p, 31m, D1 nl, D2 60-70ost, D3 nl, LCX 70ost, 134m, OM1/OM2 min irregs, RCA 30 diff, PDA 99, LIMA->LAD atretic, VG->D1 100, VG->OM1->2 100, VG->PDA->RPL 90p (4.0x23 Vision BMS);  c. 07/2012 Echo: EF 55%, gr1 DD.  [name] Cataract   . CKD (chronic kidney disease), stage III    a. renal bx 2008: GLN with vasculitis  . Diffuse large B cell lymphoma ([place]) 08/07/2016  . GERD (gastroesophageal reflux disease)   . Glaucoma    a. Cannot see out of L eye.  . Hematuria    Microscopic  . Hyperglycemia    Patient reported while on prednisone, had to take insulin  . Hyperlipemia   . Hypertension   .  Hypothyroidism   . ILD (interstitial lung disease) ([place])   . Iritis   . Local infection of skin and subcutaneous tissue   . Membranoproliferative nephritis   . Myocardial infarction ([place]) 2002  . Neutropenia, drug-induced ([place])   . Recurrent boils   . Residual foreign body in soft tissue   . Vasculitis ([place])    a.  pauciimmune vasculitis with renal involvement and hx of transient hemoptysis in the past with associated BOOP, 2008 (renal bx 2008: GLN with vasculitis). b. History of treatment with 2 cycles of Cytoxan and pheresis. H/o hemoptysis and pulm hemorrhage with 2nd cycle of cytoxan.   Family History  Problem Relation Age of Onset  . Heart disease Mother   . Diabetes Mother   . Prostate cancer Father   . Depression Other   . Diabetes Other   . Prostate cancer Other   . Colon polyps Neg Hx   . Colon cancer Neg Hx   . Rectal cancer Neg Hx   . Stomach cancer Neg Hx   . Esophageal cancer Neg Hx    Past Surgical History:  Procedure Laterality Date  . BOWEL RESECTION N/A 07/12/2016   Procedure: SMALL BOWEL RESECTION;  Surgeon: Rolm Bookbinder, MD;  Location: [facility];  Service: General;  Laterality: N/A;  . CATARACT EXTRACTION Bilateral   . CORONARY ARTERY BYPASS GRAFT  2002  . IR FLUORO GUIDE PORT INSERTION RIGHT  08/20/2016  . IR REMOVAL TUN ACCESS W/ PORT W/O FL MOD SED  01/20/2017  . IR US GUIDE VASC ACCESS RIGHT  08/20/2016  . LAPAROSCOPY N/A 07/12/2016   Procedure: LAPAROSCOPY DIAGNOSTIC;  Surgeon: Rolm Bookbinder, MD;  Location: [facility];  Service: General;  Laterality: N/A;  . LEFT HEART CATHETERIZATION WITH CORONARY/GRAFT ANGIOGRAM N/A 07/26/2012   Procedure: LEFT HEART CATHETERIZATION WITH Beatrix Fetters;  Surgeon: Wellington Hampshire, MD;  Location: [facility] CATH LAB;  Service: Cardiovascular;  Laterality: N/A;  . LUNG BIOPSY    . PERCUTANEOUS CORONARY STENT INTERVENTION (PCI-S)  07/26/2012   Procedure: PERCUTANEOUS CORONARY STENT INTERVENTION (PCI-S);  Surgeon: Wellington Hampshire, MD;  Location: [facility] CATH LAB;  Service: Cardiovascular;;  . RENAL BIOPSY    . TOE AMPUTATION  2009   hammer toe   Social History   Social History  . Marital status: Married    Spouse name: N/A  . Number of children: N/A  . Years of education: N/A   Occupational History  . [place] History Main Topics  . Smoking status: Never Smoker  . Smokeless tobacco: Never Used  . Alcohol use No  . Drug use: No  . Sexual activity: Not on file   Other  Topics Concern  . Not on file   Social History Narrative   Regular exercise - yes      [facility] (07/12/16):   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  no pets currently. No bird or mold exposure.     Review of Systems: General: negative for chills, fever, night sweats or weight changes.  Cardiovascular: negative for chest pain, dyspnea on exertion, edema, orthopnea, palpitations, paroxysmal nocturnal dyspnea or shortness of breath Dermatological: negative for rash Respiratory: negative for cough or wheezing Urologic: negative for hematuria Abdominal: negative for nausea, vomiting, diarrhea, bright red blood per rectum, melena, or hematemesis Neurologic: negative for visual changes, syncope, or dizziness All other systems reviewed and are otherwise negative except as noted above.   Physical Exam:  Blood pressure 130/74, pulse 90, height 6\' 1"  (1.854 m), weight 224 lb 6.4 oz (101.8 kg).  General appearance: alert, cooperative and no distress Neck: no carotid bruit and no JVD Lungs: clear to auscultation bilaterally Heart: regular rate and rhythm, S1, S2 normal, no murmur, click, rub or gallop Extremities: trace bilateral LEE Pulses: 2+ and symmetric Skin: Skin color, texture, turgor normal. No rashes or lesions Neurologic: Grossly normal  EKG not performed -- personally reviewed   ASSESSMENT AND PLAN:   1. CAD: h/o CABG in 2002 and stenting to his SVG-PDA/PL in 2014. Recent NST  06/2016 showed scar but no ischemia. He denies any chest pain. Continue ASA, Plavix, Crestor and BB.   2. Systolic HF: EF is reduced down to 35-40% by echo. This is new in comparison to echo in 2014 when EF was 55%. However his new EF is c/w EF measurement on recent stress test done 06/2016. EF was estimate at 30-44% and showed scar but no ischemia. In addition, he denies any recent CP. Suspect NICM. ? If chemo related. We will check a BNP today and he will possibly need titration of his Lasix. Continue BB therapy with metoprolol, but switch from Lopressor to Toprol XL, 25 mg daily, given his systolic HF. Given his CKD, we will avoid addition of ACE/ARB. I think he would benefit from addition of Bidil, but will need to see how his BP tolerates the increased dose of Lasix and change in BB before adding to his regimen. We discussed low sodium diet. F/u in 2-3 weeks for further management of heart failure meds/titration.  2. HTN: controlled on current regimen.   3. HLD: on statin therapy with Crestor.   4. DM: per PCP.   5. Stage IV Diffuse large B-cell lymphoma involving the small intestine:  S/p resection + chemo. F/u PET good. Pt told he is in remission.    6. CKD: followed by nephrology.     Vaness Jelinski Ladoris Gene, MHS [facility] 01/21/2017 11:14 AM

## 2017-01-24 ENCOUNTER — Telehealth: Payer: Self-pay

## 2017-01-24 DIAGNOSIS — R7989 Other specified abnormal findings of blood chemistry: Secondary | ICD-10-CM

## 2017-01-24 DIAGNOSIS — Z79899 Other long term (current) drug therapy: Secondary | ICD-10-CM

## 2017-01-24 MED ORDER — METOPROLOL SUCCINATE ER 25 MG PO TB24: 25.0000 mg | ORAL_TABLET | Freq: Every day | ORAL | 3 refills | Status: DC

## 2017-01-24 MED ORDER — FUROSEMIDE 40 MG PO TABS: 40.0000 mg | ORAL_TABLET | Freq: Every day | ORAL | 3 refills | Status: DC

## 2017-01-24 NOTE — Telephone Encounter (Addendum)
Called patient with lab results.  Per Lyda Jester PA, BNP (lab test to detect if shortness of breath is related to HF/extra fluid) is abnormal at 880. I recommended that he increase his Lasix dose from 20 mg daily to 40 mg daily. F/u BMP in 10 days to recheck renal function and electrolytes and office f/u in 2-3 weeks. He can see Dr. [name], APP or pharmacist for BP check and management of HF regimen. I think he would benefit from addition of Bidil (HF medication), however we need to see how his BP handles the higher dose of Lasix before adding to his regimen. ------   Patient is aware of changes and has lab and OV scheduled.

## 2017-01-24 NOTE — Telephone Encounter (Signed)
-----   Message from Consuelo Pandy, [place] sent at 01/24/2017  2:23 PM EDT ----- Regarding: Medication Change for Luke Mcbride Pt I tried to attach this to result note I sent to you earlier. Given his reduced LVEF, I think he would benefit if we changed his metoprolol from short acting lopressor to long acting Toprol XL, which has been studied and approved for systolic HF. Please notify patient. I did not catch this when he was here on Friday. Stop Lopressor and change to Toprol XL 25 mg once daily. He can f/u with pharmacist or Dr. [name] in ~2-3 weeks for assessment and further titration of meds. Possibly addition of Bidil if doing ok. Thanks.

## 2017-01-27 DIAGNOSIS — I129 Hypertensive chronic kidney disease with stage 1 through stage 4 chronic kidney disease, or unspecified chronic kidney disease: Secondary | ICD-10-CM | POA: Diagnosis not present

## 2017-01-27 DIAGNOSIS — J42 Unspecified chronic bronchitis: Secondary | ICD-10-CM | POA: Diagnosis not present

## 2017-01-27 DIAGNOSIS — R109 Unspecified abdominal pain: Secondary | ICD-10-CM | POA: Diagnosis not present

## 2017-01-27 DIAGNOSIS — E7800 Pure hypercholesterolemia, unspecified: Secondary | ICD-10-CM | POA: Diagnosis not present

## 2017-01-27 DIAGNOSIS — R05 Cough: Secondary | ICD-10-CM | POA: Diagnosis not present

## 2017-01-27 DIAGNOSIS — Z23 Encounter for immunization: Secondary | ICD-10-CM | POA: Diagnosis not present

## 2017-01-27 DIAGNOSIS — I2510 Atherosclerotic heart disease of native coronary artery without angina pectoris: Secondary | ICD-10-CM | POA: Diagnosis not present

## 2017-01-27 DIAGNOSIS — N400 Enlarged prostate without lower urinary tract symptoms: Secondary | ICD-10-CM | POA: Diagnosis not present

## 2017-01-27 DIAGNOSIS — N183 Chronic kidney disease, stage 3 (moderate): Secondary | ICD-10-CM | POA: Diagnosis not present

## 2017-01-27 DIAGNOSIS — C8330 Diffuse large B-cell lymphoma, unspecified site: Secondary | ICD-10-CM | POA: Diagnosis not present

## 2017-01-27 DIAGNOSIS — E669 Obesity, unspecified: Secondary | ICD-10-CM | POA: Diagnosis not present

## 2017-01-27 DIAGNOSIS — N013 Rapidly progressive nephritic syndrome with diffuse mesangial proliferative glomerulonephritis: Secondary | ICD-10-CM | POA: Diagnosis not present

## 2017-02-03 ENCOUNTER — Other Ambulatory Visit: Payer: Medicare Other | Admitting: *Deleted

## 2017-02-03 DIAGNOSIS — R7989 Other specified abnormal findings of blood chemistry: Secondary | ICD-10-CM

## 2017-02-03 DIAGNOSIS — Z79899 Other long term (current) drug therapy: Secondary | ICD-10-CM | POA: Diagnosis not present

## 2017-02-03 LAB — BASIC METABOLIC PANEL
BUN/Creatinine Ratio: 18 (ref 10–24)
BUN: 38 mg/dL — AB (ref 8–27)
CALCIUM: 8.9 mg/dL (ref 8.6–10.2)
CO2: 21 mmol/L (ref 20–29)
CREATININE: 2.16 mg/dL — AB (ref 0.76–1.27)
Chloride: 103 mmol/L (ref 96–106)
GFR, EST AFRICAN AMERICAN: 32 mL/min/{1.73_m2} — AB (ref >59)
GFR, EST NON AFRICAN AMERICAN: 28 mL/min/{1.73_m2} — AB (ref >59)
Glucose: 165 mg/dL — ABNORMAL HIGH (ref 65–99)
Potassium: 4.4 mmol/L (ref 3.5–5.2)
Sodium: 141 mmol/L (ref 134–144)

## 2017-02-08 DIAGNOSIS — H44512 Absolute glaucoma, left eye: Secondary | ICD-10-CM | POA: Diagnosis not present

## 2017-02-08 DIAGNOSIS — Z9841 Cataract extraction status, right eye: Secondary | ICD-10-CM | POA: Diagnosis not present

## 2017-02-08 DIAGNOSIS — Z9842 Cataract extraction status, left eye: Secondary | ICD-10-CM | POA: Diagnosis not present

## 2017-02-08 DIAGNOSIS — H40021 Open angle with borderline findings, high risk, right eye: Secondary | ICD-10-CM | POA: Diagnosis not present

## 2017-02-11 ENCOUNTER — Encounter: Payer: Self-pay | Admitting: Cardiology

## 2017-02-21 NOTE — Progress Notes (Signed)
Cardiology Office Note   Date:  02/22/2017   ID:  Luke Mcbride, DOB 1936/03/15, MRN [id number]  PCP:  Donato Heinz, MD  Cardiologist:  Dr. [name]    Chief Complaint  Patient presents with  . Congestive Heart Failure      History of Present Illness: Luke Mcbride is a 81 y.o. male who presents for HF follow up.  He had CABG X 6 in 2002. He was admitted 07/2012 with NSTEMI and underwent BMS to the SVR-PDA/PL. His LIMA to LAD was atretic but disease in the LAD was moderate. SVG-OM and SVG to diagonal were known to be occluded. Other PMH includes DM, HTN, HLD and CKD.   Most recently, in February, he was diagnosed with Stage IV Diffuse large B-cell lymphoma involving the small intestine. He required surgery. Prior to surgery, he had a nuclear stress test on 07/01/16 which showed scar but no ischemia. He was cleared to undergo surgery. He had 6 inches of his colon resected. Following surgery he had 6 rounds of chemo. He recently had a f/u PET scan and was told he was in remission.   Recently seen for DOE, echo revealed EF 35-40% down from 55%.  Pt was seen back and lopressor changed to toprol XL, with his CKD plan was to avoid ACE/ARB. BIdil is to be added.  He recently had a nuc study in 06/2016 prior to surgery with scar but no ischemia.  No chest pain.  BNP was elevated at 880, lasix increased to 40 daily.    Today he has not seen any improvement with increased dose of lasix. He has had 2 episodes of lightheadedness.  No syncope.  Last Cr was up to 2.16.  No chest pain.  Only short of breath with exertion can only walk 30 yards.  Past Medical History:  Diagnosis Date  . ALLERGIC RHINITIS   . Anemia    hx  . Barrett's esophagus   . BOOP (bronchiolitis obliterans with organizing pneumonia) ([place])    a. s/p R VATS 2008.  [name] CAD (coronary artery disease)    a. 05/2000: NSTEMI/CABG x 6: LIMA->LAD, VG->D1, VG->OM1->2, VG->PDA->RPL;  b. 07/2007 MV: high lat infarct, no ischemia, EF  47%;  c. Cath/PCI: LM nl, LAD20p, 78m, D1 nl, D2 60-70ost, D3 nl, LCX 70ost, 169m, OM1/OM2 min irregs, RCA 30 diff, PDA 99, LIMA->LAD atretic, VG->D1 100, VG->OM1->2 100, VG->PDA->RPL 90p (4.0x23 Vision BMS);  c. 07/2012 Echo: EF 55%, gr1 DD.  [name] Cataract   . CKD (chronic kidney disease), stage III ([place])    a. renal bx 2008: GLN with vasculitis  . Diffuse large B cell lymphoma ([place]) 08/07/2016  . GERD (gastroesophageal reflux disease)   . Glaucoma    a. Cannot see out of L eye.  . Hematuria    Microscopic  . Hyperglycemia    Patient reported while on prednisone, had to take insulin  . Hyperlipemia   . Hypertension   . Hypothyroidism   . ILD (interstitial lung disease) ([place])   . Iritis   . Local infection of skin and subcutaneous tissue   . Membranoproliferative nephritis   . Myocardial infarction ([place]) 2002  . Neutropenia, drug-induced ([place])   . Recurrent boils   . Residual foreign body in soft tissue   . Vasculitis ([place])    a.  pauciimmune vasculitis with renal involvement and hx of transient hemoptysis in the past with associated BOOP, 2008 (renal bx 2008: GLN with vasculitis). b. History of  treatment with 2 cycles of Cytoxan and pheresis. H/o hemoptysis and pulm hemorrhage with 2nd cycle of cytoxan.    Past Surgical History:  Procedure Laterality Date  . BOWEL RESECTION N/A 07/12/2016   Procedure: SMALL BOWEL RESECTION;  Surgeon: Rolm Bookbinder, MD;  Location: [facility];  Service: General;  Laterality: N/A;  . CATARACT EXTRACTION Bilateral   . CORONARY ARTERY BYPASS GRAFT  2002  . IR FLUORO GUIDE PORT INSERTION RIGHT  08/20/2016  . IR REMOVAL TUN ACCESS W/ PORT W/O FL MOD SED  01/20/2017  . IR US GUIDE VASC ACCESS RIGHT  08/20/2016  . LAPAROSCOPY N/A 07/12/2016   Procedure: LAPAROSCOPY DIAGNOSTIC;  Surgeon: Rolm Bookbinder, MD;  Location: [address];  Service: General;  Laterality: N/A;  . LEFT HEART CATHETERIZATION WITH CORONARY/GRAFT ANGIOGRAM N/A 07/26/2012   Procedure: LEFT HEART  CATHETERIZATION WITH Beatrix Fetters;  Surgeon: Wellington Hampshire, MD;  Location: [facility] CATH LAB;  Service: Cardiovascular;  Laterality: N/A;  . LUNG BIOPSY    . PERCUTANEOUS CORONARY STENT INTERVENTION (PCI-S)  07/26/2012   Procedure: PERCUTANEOUS CORONARY STENT INTERVENTION (PCI-S);  Surgeon: Wellington Hampshire, MD;  Location: [facility] CATH LAB;  Service: Cardiovascular;;  . RENAL BIOPSY    . TOE AMPUTATION  2009   hammer toe     Current Outpatient Prescriptions  Medication Sig Dispense Refill  . acetaminophen (TYLENOL) 500 MG tablet Take 500-1,000 mg by mouth daily as needed for moderate pain or headache.    [name] aspirin EC 81 MG EC tablet Take 1 tablet (81 mg total) by mouth daily. 30 tablet   . clopidogrel (PLAVIX) 75 MG tablet Take 1 tablet (75 mg total) by mouth daily. 90 tablet 1  . finasteride (PROSCAR) 5 MG tablet Take 5 mg by mouth at bedtime.     . furosemide (LASIX) 40 MG tablet Take 1 tablet (40 mg total) by mouth daily. 90 tablet 3  . levothyroxine (SYNTHROID, LEVOTHROID) 100 MCG tablet Take 100 mcg by mouth daily.      . metoprolol succinate (TOPROL XL) 25 MG 24 hr tablet Take 1 tablet (25 mg total) by mouth daily. 90 tablet 3  . Multiple Vitamin (MULTIVITAMIN) tablet Take 1 tablet by mouth daily.      . nitroGLYCERIN (NITROSTAT) 0.4 MG SL tablet Place 0.4 mg under the tongue every 5 (five) minutes as needed for chest pain.    . pantoprazole (PROTONIX) 40 MG tablet TAKE 1 TABLET BY MOUTH  DAILY 90 tablet 1  . Tamsulosin HCl (FLOMAX) 0.4 MG CAPS Take 0.4 mg by mouth at bedtime.     [name] telmisartan (MICARDIS) 40 MG tablet Take 20 mg by mouth daily.      No current facility-administered medications for this visit.    Facility-Administered Medications Ordered in Other Visits  Medication Dose Route Frequency Provider Last Rate Last Dose  . sodium chloride flush (NS) 0.9 % injection 10 mL  10 mL Intracatheter PRN Brunetta Genera, MD   10 mL at 08/24/16 1653  . sodium chloride  flush (NS) 0.9 % injection 10 mL  10 mL Intracatheter PRN Brunetta Genera, MD   10 mL at 10/07/16 1555    Allergies:   No known allergies    Social History:  The patient  reports that he has never smoked. He has never used smokeless tobacco. He reports that he does not drink alcohol or use drugs.   Family History:  The patient's family history includes Depression in his other; Diabetes in  his mother and other; Heart disease in his mother; Prostate cancer in his father and other.    ROS:  General:no colds or fevers, + weight increase Skin:no rashes or ulcers HEENT:no blurred vision, no congestion CV:see HPI PUL:see HPI GI:no diarrhea constipation or melena, no indigestion GU:no hematuria, no dysuria MS:no joint pain, no claudication Neuro:no syncope, no lightheadedness Endo:no diabetes, + thyroid disease  Wt Readings from Last 3 Encounters:  02/22/17 226 lb (102.5 kg)  01/21/17 224 lb 6.4 oz (101.8 kg)  01/05/17 227 lb 6.4 oz (103.1 kg)     PHYSICAL EXAM: VS:  BP 118/70   Pulse 84   Ht 6\' 1"  (1.854 m)   Wt 226 lb (102.5 kg)   SpO2 97%   BMI 29.82 kg/m  , BMI Body mass index is 29.82 kg/m. General:Pleasant affect, NAD Skin:Warm and dry, brisk capillary refill HEENT:normocephalic, sclera clear, mucus membranes moist Neck:supple, no JVD, no bruits  Heart:S1S2 RRR without murmur, gallup, rub or click Lungs:clear without rales, rhonchi, or wheezes KDX:IPJA, non tender, + BS, do not palpate liver spleen or masses Ext:no lower ext edema, 2+ pedal pulses, 2+ radial pulses Neuro:alert and oriented, MAE, follows commands, + facial symmetry    EKG:  EKG is NOT ordered today.    Recent Labs: 09/01/2016: B Natriuretic Peptide 288.4 01/05/2017: ALT 16 01/20/2017: Hemoglobin 10.8; Platelets 204 01/21/2017: NT-Pro BNP 880 02/03/2017: BUN 38; Creatinine, Ser 2.16; Potassium 4.4; Sodium 141    Lipid Panel    Component Value Date/Time   CHOL 157 02/11/2014 0733   TRIG 150.0  (H) 02/11/2014 0733   HDL 36.30 (L) 02/11/2014 0733   CHOLHDL 4 02/11/2014 0733   VLDL 30.0 02/11/2014 0733   LDLCALC 91 02/11/2014 0733   LDLDIRECT 221.7 05/23/2007 0000       Other studies Reviewed: Additional studies/ records that were reviewed today include: .  Echo 01/10/17 Study Conclusions  - Left ventricle: The cavity size was mildly dilated. There was   mild focal basal hypertrophy of the septum. Systolic function was   mildly to moderately reduced. The estimated ejection fraction was   in the range of 40% to 45%. Mild diffuse hypokinesis. There was   fusion of early and atrial contributions to ventricular filling.   The study is not technically sufficient to allow evaluation of LV   diastolic function. - Left atrium: The atrium was mildly dilated. - Right ventricle: The cavity size was mildly dilated. Wall   thickness was normal. - Recommendations: Recommend limited repeat study with definity   contrast to assess wall motion and more accurate assessment of   EF.  Recommendations:  Recommend limited repeat study with definity contrast to assess wall motion and more accurate assessment of EF.   Echo with definity 01/18/17  - Left ventricle: The cavity size was normal. There was mild focal   basal hypertrophy of the septum. Systolic function was moderately   reduced. The estimated ejection fraction was in the range of 35%   to 40%. There is akinesis of the inferolateral myocardium.   Doppler parameters are consistent with abnormal left ventricular   relaxation (grade 1 diastolic dysfunction). - Mitral valve: Calcified annulus. There was mild regurgitation. - Left atrium: The atrium was mildly dilated. - Atrial septum: There was an atrial septal aneurysm.  Impressions:  - Limited study with definity; full doppler not performed; akinesis   of the inferolateral wall with overall moderately reduced LV   systolic function; mild diastolic dysfunction; mild  MR; mild  LAE.   Nuc study 07/01/16 Study Highlights     The left ventricular ejection fraction is moderately decreased (30-44%).  Nuclear stress EF: 40%.  There was no ST segment deviation noted during stress.  No T wave inversion was noted during stress.  Defect 1: There is a large defect of severe severity present in the basal inferolateral, basal anterolateral, mid inferolateral and mid anterolateral location.  Findings consistent with prior myocardial infarction.  This is an intermediate risk study.   Intermediate risk stress nuclear study with a large scar in the left circumflex coronary artery distribution moderate reduction in left ventricular systolic function.      ASSESSMENT AND PLAN:  1.  Cardiomyopathy with DOE,  While pt had neg nuc in Feb 2018 may be appropriate to repeat cath to eval CAD with known graft occlusion of VG to Diag  And occl VG to OM and PCI in 2014 to VG to rt PDA/PL with BMS, also with atretic LIMA though the native LAD had only moderate nonobstructive disease.  For now will decrease the lasix to 20 and add BiDIl twice a day and see pt back in 2-3 weeks.  He also has appt in Dec with Dr. [name].   2.  CAD may need cath  3.   Interstitial lung disease.  May need follow up with pulmonary  4.    HTN stable   5.    HLD continue statin  6.     CKD with elevated Cr - will decrease to 20 mg lasix and recheck BMP today  7.    Diffuse large B cell lymphoma has been treated and in remission.    Current medicines are reviewed with the patient today.  The patient Has no concerns regarding medicines.  The following changes have been made:  See above Labs/ tests ordered today include:see above  Disposition:   FU:  see above  Signed, [name], NP  02/22/2017 9:43 AM    [facility] [facility], [place], [place], [place] Phone: [phone]; Fax: [phone]

## 2017-02-22 ENCOUNTER — Ambulatory Visit (INDEPENDENT_AMBULATORY_CARE_PROVIDER_SITE_OTHER): Payer: Medicare Other | Admitting: Cardiology

## 2017-02-22 ENCOUNTER — Encounter: Payer: Self-pay | Admitting: Cardiology

## 2017-02-22 VITALS — BP 118/70 | HR 84 | Ht 73.0 in | Wt 226.0 lb

## 2017-02-22 DIAGNOSIS — I2510 Atherosclerotic heart disease of native coronary artery without angina pectoris: Secondary | ICD-10-CM | POA: Diagnosis not present

## 2017-02-22 DIAGNOSIS — I428 Other cardiomyopathies: Secondary | ICD-10-CM | POA: Diagnosis not present

## 2017-02-22 DIAGNOSIS — I5020 Unspecified systolic (congestive) heart failure: Secondary | ICD-10-CM

## 2017-02-22 DIAGNOSIS — R0609 Other forms of dyspnea: Secondary | ICD-10-CM | POA: Diagnosis not present

## 2017-02-22 DIAGNOSIS — I10 Essential (primary) hypertension: Secondary | ICD-10-CM | POA: Diagnosis not present

## 2017-02-22 DIAGNOSIS — I2583 Coronary atherosclerosis due to lipid rich plaque: Secondary | ICD-10-CM

## 2017-02-22 DIAGNOSIS — J849 Interstitial pulmonary disease, unspecified: Secondary | ICD-10-CM

## 2017-02-22 LAB — BASIC METABOLIC PANEL
BUN / CREAT RATIO: 20 (ref 10–24)
BUN: 39 mg/dL — AB (ref 8–27)
CALCIUM: 9 mg/dL (ref 8.6–10.2)
CHLORIDE: 106 mmol/L (ref 96–106)
CO2: 18 mmol/L — ABNORMAL LOW (ref 20–29)
CREATININE: 2 mg/dL — AB (ref 0.76–1.27)
GFR calc Af Amer: 36 mL/min/{1.73_m2} — ABNORMAL LOW (ref >59)
GFR calc non Af Amer: 31 mL/min/{1.73_m2} — ABNORMAL LOW (ref >59)
GLUCOSE: 84 mg/dL (ref 65–99)
Potassium: 4.7 mmol/L (ref 3.5–5.2)
Sodium: 140 mmol/L (ref 134–144)

## 2017-02-22 MED ORDER — ISOSORB DINITRATE-HYDRALAZINE 20-37.5 MG PO TABS: 1.0000 | ORAL_TABLET | Freq: Two times a day (BID) | ORAL | 3 refills | Status: DC

## 2017-02-22 MED ORDER — FUROSEMIDE 20 MG PO TABS: 20.0000 mg | ORAL_TABLET | Freq: Every day | ORAL | 3 refills | Status: DC

## 2017-02-22 NOTE — Patient Instructions (Signed)
Medication Instructions:  Your physician has recommended you make the following change in your medication:  Decrease furosemide to 20 mg by mouth daily. Start Bidil 20/37.5 mg by mouth twice daily.    Labwork: Lab work to be done today--BMP  Testing/Procedures: none  Follow-Up: You have a follow up appointment with [name], NP on November 2,2018 at 9:30  Any Other Special Instructions Will Be Listed Below (If Applicable).     If you need a refill on your cardiac medications before your next appointment, please call your pharmacy.

## 2017-03-07 ENCOUNTER — Telehealth: Payer: Self-pay

## 2017-03-07 ENCOUNTER — Other Ambulatory Visit (HOSPITAL_BASED_OUTPATIENT_CLINIC_OR_DEPARTMENT_OTHER): Payer: Medicare Other

## 2017-03-07 ENCOUNTER — Ambulatory Visit (HOSPITAL_BASED_OUTPATIENT_CLINIC_OR_DEPARTMENT_OTHER): Payer: Medicare Other | Admitting: Hematology

## 2017-03-07 ENCOUNTER — Encounter: Payer: Self-pay | Admitting: Hematology

## 2017-03-07 VITALS — BP 130/71 | HR 85 | Temp 97.8°F | Resp 18 | Ht 73.0 in | Wt 225.3 lb

## 2017-03-07 DIAGNOSIS — C8338 Diffuse large B-cell lymphoma, lymph nodes of multiple sites: Secondary | ICD-10-CM | POA: Diagnosis not present

## 2017-03-07 LAB — CBC & DIFF AND RETIC
BASO%: 0.6 % (ref 0.0–2.0)
Basophils Absolute: 0 10*3/uL (ref 0.0–0.1)
EOS%: 3.5 % (ref 0.0–7.0)
Eosinophils Absolute: 0.2 10*3/uL (ref 0.0–0.5)
HCT: 34.3 % — ABNORMAL LOW (ref 38.4–49.9)
HGB: 10.8 g/dL — ABNORMAL LOW (ref 13.0–17.1)
Immature Retic Fract: 12.4 % — ABNORMAL HIGH (ref 3.00–10.60)
LYMPH%: 17.8 % (ref 14.0–49.0)
MCH: 31.4 pg (ref 27.2–33.4)
MCHC: 31.5 g/dL — AB (ref 32.0–36.0)
MCV: 99.7 fL — ABNORMAL HIGH (ref 79.3–98.0)
MONO#: 0.4 10*3/uL (ref 0.1–0.9)
MONO%: 8 % (ref 0.0–14.0)
NEUT%: 70.1 % (ref 39.0–75.0)
NEUTROS ABS: 3.4 10*3/uL (ref 1.5–6.5)
Platelets: 176 10*3/uL (ref 140–400)
RBC: 3.44 10*6/uL — ABNORMAL LOW (ref 4.20–5.82)
RDW: 16.5 % — AB (ref 11.0–14.6)
RETIC %: 1.93 % — AB (ref 0.80–1.80)
Retic Ct Abs: 66.39 10*3/uL (ref 34.80–93.90)
WBC: 4.9 10*3/uL (ref 4.0–10.3)
lymph#: 0.9 10*3/uL (ref 0.9–3.3)

## 2017-03-07 LAB — COMPREHENSIVE METABOLIC PANEL
ALT: 16 U/L (ref 0–55)
AST: 16 U/L (ref 5–34)
Albumin: 3.5 g/dL (ref 3.5–5.0)
Alkaline Phosphatase: 69 U/L (ref 40–150)
Anion Gap: 9 mEq/L (ref 3–11)
BUN: 36.6 mg/dL — AB (ref 7.0–26.0)
CALCIUM: 9.3 mg/dL (ref 8.4–10.4)
CHLORIDE: 110 meq/L — AB (ref 98–109)
CO2: 22 mEq/L (ref 22–29)
Creatinine: 2.1 mg/dL — ABNORMAL HIGH (ref 0.7–1.3)
EGFR: 29 mL/min/{1.73_m2} — ABNORMAL LOW (ref >60)
GLUCOSE: 104 mg/dL (ref 70–140)
POTASSIUM: 4.5 meq/L (ref 3.5–5.1)
SODIUM: 141 meq/L (ref 136–145)
Total Bilirubin: 0.41 mg/dL (ref 0.20–1.20)
Total Protein: 7 g/dL (ref 6.4–8.3)

## 2017-03-07 LAB — LACTATE DEHYDROGENASE: LDH: 181 U/L (ref 125–245)

## 2017-03-07 NOTE — Progress Notes (Signed)
HEMATOLOGY/ONCOLOGY CLINIC NOTE  Date of Service: 03/07/17  Patient Care Team: Donato Heinz, MD as PCP - General (Nephrology) Brunetta Genera, MD as Consulting Physician (Hematology) [name], MD as Consulting Physician (Cardiology)  CHIEF COMPLAINTS/PURPOSE OF CONSULTATION:  Diffuse large B cell lymphoma of the small intestine  HISTORY OF PRESENTING ILLNESS:   Luke Mcbride is a wonderful 81 y.o. male who has been referred to [place] by Dr .Donato Heinz, MD and Mauricio Po MD for evaluation and management of newly diagnosed diffuse large B-cell lymphoma of the small bowel.  Patient has a history of multiple medical comorbidities as noted below including coronary artery disease status post PCI and CABG, chronic kidney disease stage III, Barrett's esophagus, hypertension, dyslipidemia, interstitial lung disease, GERD, vasculitis involving the lungs and kidneys on chronic Imuran therapy.  He was having upper abdominal discomfort for a few months with loss of about 15-20 pounds. He had a CT scan of the abdomen done by Dr. Havery Moros on 06/15/2016 which showed a 3.3 x 3.2 x 2.4 cm indeterminate lesion involving the left upper quadrant small bowel loops. No additional small bowel lesions noted. No ascites. No retroperitoneal mesenteric or pelvic lymphadenopathy noted.  Patient was seen by Dr. Serita Grammes and underwent a diagnostic laparoscopy with small bowel resection on 07/12/2016. Pathology showed that the lesion was consistent with diffuse large B-cell lymphoma 2 cm in length and 4 cm in width centrally ulcerated mass involving 100% of the bowel circumference. The tumor was about 1 cm thick invading through the entire wall and into the underlying mesenteric fat. The tumor also abuts and focally involve the serosa. No extramural satellite lymph nodes noted. Did not seem to involve the peri-intestinal lymph nodes. Tumor was noted to have a germinal center  phenotype.  Patient is healing well from surgery. Continues to be on Imuran 100 mg by mouth daily. Has not reported any other peripheral enlarged lymph nodes. He is starting to eat better.   INTERVAL HISTORY  Luke Mcbride is here for follow-up appointment. Pt presents to the office today accompanied by his wife. He reports that he is doing well overall. He notes some fatigue and leg swelling and has had addition of hydralazine/Isosorbide and adjustment of diuretics by his cardiologist. He states he would not like to start physical therapy and enjoys working around his yard outside.  No concerning new constitutional symptoms reported  On review of systems, pt reports numbness to his feet and denies abdominal pain, changes in BM, weight loss, fever, chills and any other accompanying symptoms. No new lumps/bumps.                MEDICAL HISTORY:  Past Medical History:  Diagnosis Date  . ALLERGIC RHINITIS   . Anemia    hx  . Barrett's esophagus   . BOOP (bronchiolitis obliterans with organizing pneumonia) ([place])    a. s/p R VATS 2008.  [name] CAD (coronary artery disease)    a. 05/2000: NSTEMI/CABG x 6: LIMA->LAD, VG->D1, VG->OM1->2, VG->PDA->RPL;  b. 07/2007 MV: high lat infarct, no ischemia, EF 47%;  c. Cath/PCI: LM nl, LAD20p, 58m, D1 nl, D2 60-70ost, D3 nl, LCX 70ost, 142m, OM1/OM2 min irregs, RCA 30 diff, PDA 99, LIMA->LAD atretic, VG->D1 100, VG->OM1->2 100, VG->PDA->RPL 90p (4.0x23 Vision BMS);  c. 07/2012 Echo: EF 55%, gr1 DD.  [name] Cataract   . CKD (chronic kidney disease), stage III ([place])    a. renal bx 2008: GLN with vasculitis  .  Diffuse large B cell lymphoma ([place]) 08/07/2016  . GERD (gastroesophageal reflux disease)   . Glaucoma    a. Cannot see out of L eye.  . Hematuria    Microscopic  . Hyperglycemia    Patient reported while on prednisone, had to take insulin  . Hyperlipemia   . Hypertension   . Hypothyroidism   . ILD (interstitial lung disease) ([place])   . Iritis   . Local  infection of skin and subcutaneous tissue   . Membranoproliferative nephritis   . Myocardial infarction ([place]) 2002  . Neutropenia, drug-induced ([place])   . Recurrent boils   . Residual foreign body in soft tissue   . Vasculitis ([place])    a.  pauciimmune vasculitis with renal involvement and hx of transient hemoptysis in the past with associated BOOP, 2008 (renal bx 2008: GLN with vasculitis). b. History of treatment with 2 cycles of Cytoxan and pheresis. H/o hemoptysis and pulm hemorrhage with 2nd cycle of cytoxan.    SURGICAL HISTORY: Past Surgical History:  Procedure Laterality Date  . BOWEL RESECTION N/A 07/12/2016   Procedure: SMALL BOWEL RESECTION;  Surgeon: Rolm Bookbinder, MD;  Location: [facility];  Service: General;  Laterality: N/A;  . CATARACT EXTRACTION Bilateral   . CORONARY ARTERY BYPASS GRAFT  2002  . IR FLUORO GUIDE PORT INSERTION RIGHT  08/20/2016  . IR REMOVAL TUN ACCESS W/ PORT W/O FL MOD SED  01/20/2017  . IR US GUIDE VASC ACCESS RIGHT  08/20/2016  . LAPAROSCOPY N/A 07/12/2016   Procedure: LAPAROSCOPY DIAGNOSTIC;  Surgeon: Rolm Bookbinder, MD;  Location: [facility];  Service: General;  Laterality: N/A;  . LEFT HEART CATHETERIZATION WITH CORONARY/GRAFT ANGIOGRAM N/A 07/26/2012   Procedure: LEFT HEART CATHETERIZATION WITH Beatrix Fetters;  Surgeon: Wellington Hampshire, MD;  Location: [facility] CATH LAB;  Service: Cardiovascular;  Laterality: N/A;  . LUNG BIOPSY    . PERCUTANEOUS CORONARY STENT INTERVENTION (PCI-S)  07/26/2012   Procedure: PERCUTANEOUS CORONARY STENT INTERVENTION (PCI-S);  Surgeon: Wellington Hampshire, MD;  Location: [facility];  Service: Cardiovascular;;  . RENAL BIOPSY    . TOE AMPUTATION  2009   hammer toe    SOCIAL HISTORY: Social History   Social History  . Marital status: Married    Spouse name: N/A  . Number of children: N/A  . Years of education: N/A   Occupational History  . [place] History Main Topics  . Smoking status: Never Smoker  .  Smokeless tobacco: Never Used  . Alcohol use No  . Drug use: No  . Sexual activity: Not on file   Other Topics Concern  . Not on file   Social History Narrative   Regular exercise - yes      [facility] (07/12/16):   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  no pets currently. No bird or mold exposure.    FAMILY HISTORY: Family History  Problem Relation Age of Onset  . Heart disease Mother   . Diabetes Mother   . Prostate cancer Father   . Depression Other   . Diabetes Other   . Prostate cancer Other   . Colon polyps Neg Hx   . Colon cancer Neg Hx   . Rectal cancer Neg Hx   . Stomach cancer Neg Hx   . Esophageal cancer Neg Hx     ALLERGIES:  is allergic to no known allergies.  MEDICATIONS:  Current Outpatient Prescriptions  Medication Sig Dispense  Refill  . acetaminophen (TYLENOL) 500 MG tablet Take 500-1,000 mg by mouth daily as needed for moderate pain or headache.    [name] aspirin EC 81 MG EC tablet Take 1 tablet (81 mg total) by mouth daily. 30 tablet   . clopidogrel (PLAVIX) 75 MG tablet Take 1 tablet (75 mg total) by mouth daily. 90 tablet 1  . finasteride (PROSCAR) 5 MG tablet Take 5 mg by mouth at bedtime.     . furosemide (LASIX) 20 MG tablet Take 1 tablet (20 mg total) by mouth daily. 30 tablet 3  . isosorbide-hydrALAZINE (BIDIL) 20-37.5 MG tablet Take 1 tablet by mouth 2 (two) times daily. 60 tablet 3  . levothyroxine (SYNTHROID, LEVOTHROID) 100 MCG tablet Take 100 mcg by mouth daily.      . metoprolol succinate (TOPROL XL) 25 MG 24 hr tablet Take 1 tablet (25 mg total) by mouth daily. 90 tablet 3  . Multiple Vitamin (MULTIVITAMIN) tablet Take 1 tablet by mouth daily.      . nitroGLYCERIN (NITROSTAT) 0.4 MG SL tablet Place 0.4 mg under the tongue every 5 (five) minutes as needed for chest pain.    . pantoprazole (PROTONIX) 40 MG tablet TAKE 1 TABLET BY MOUTH  DAILY 90 tablet 1  . Tamsulosin HCl (FLOMAX) 0.4 MG CAPS Take 0.4 mg by mouth  at bedtime.     [name] telmisartan (MICARDIS) 40 MG tablet Take 20 mg by mouth daily.      No current facility-administered medications for this visit.    Facility-Administered Medications Ordered in Other Visits  Medication Dose Route Frequency Provider Last Rate Last Dose  . sodium chloride flush (NS) 0.9 % injection 10 mL  10 mL Intracatheter PRN Brunetta Genera, MD   10 mL at 08/24/16 1653  . sodium chloride flush (NS) 0.9 % injection 10 mL  10 mL Intracatheter PRN Brunetta Genera, MD   10 mL at 10/07/16 1555    REVIEW OF SYSTEMS:    10 Point review of Systems was done is negative except as noted above.  PHYSICAL EXAMINATION: ECOG PERFORMANCE STATUS: 2 - Symptomatic, <50% confined to bed  . Vitals:   03/07/17 0922  BP: 130/71  Pulse: 85  Resp: 18  Temp: 97.8 F (36.6 C)  SpO2: 98%   Filed Weights   03/07/17 0922  Weight: 225 lb 4.8 oz (102.2 kg)   .Body mass index is 29.72 kg/m.  GENERAL:alert, in no acute distress and comfortable SKIN: no acute rashes, no significant lesions EYES: conjunctiva are pink and non-injected, sclera anicteric OROPHARYNX: MMM, no exudates, no oropharyngeal erythema or ulceration NECK: supple, no JVD LYMPH:  no palpable lymphadenopathy in the cervical, axillary or inguinal regions LUNGS: clear to auscultation b/l with normal respiratory effort HEART: regular rate & rhythm ABDOMEN:  normoactive bowel sounds , non tender, not distended. Healed laparoscopic surgical scars Extremity: 1+ pitting pedal edema improved from last visit PSYCH: alert & oriented x 3 with fluent speech NEURO: no focal motor/sensory deficits  LABORATORY DATA:  I have reviewed the data as listed . CBC Latest Ref Rng & Units 03/07/2017 01/20/2017 01/05/2017  WBC 4.0 - 10.3 10e3/uL 4.9 7.6 5.5  Hemoglobin 13.0 - 17.1 g/dL 10.8(L) 10.8(L) 10.0(L)  Hematocrit 38.4 - 49.9 % 34.3(L) 34.2(L) 32.2(L)  Platelets 140 - 400 10e3/uL 176 204 190   . CMP Latest Ref Rng &  Units 03/07/2017 02/22/2017 02/03/2017  Glucose 70 - 140 mg/dl 104 84 165(H)  BUN 7.0 - 26.0  mg/dL 36.6(H) 39(H) 38(H)  Creatinine 0.7 - 1.3 mg/dL 2.1(H) 2.00(H) 2.16(H)  Sodium 136 - 145 mEq/L 141 140 141  Potassium 3.5 - 5.1 mEq/L 4.5 4.7 4.4  Chloride 96 - 106 mmol/L - 106 103  CO2 22 - 29 mEq/L 22 18(L) 21  Calcium 8.4 - 10.4 mg/dL 9.3 9.0 8.9  Total Protein 6.4 - 8.3 g/dL 7.0 - -  Total Bilirubin 0.20 - 1.20 mg/dL 0.41 - -  Alkaline Phos 40 - 150 U/L 69 - -  AST 5 - 34 U/L 16 - -  ALT 0 - 55 U/L 16 - -    . Lab Results  Component Value Date   LDH 181 03/07/2017     RADIOGRAPHIC STUDIES: I have personally reviewed the radiological images as listed and agreed with the findings in the report.  Nm Pet Image Restag (ps) Skull Base To Thigh  Result Date: 10/22/2016 CLINICAL DATA:  Subsequent treatment strategy for diffuse large B-cell non-Hodgkin's lymphoma. EXAM: NUCLEAR MEDICINE PET SKULL BASE TO THIGH TECHNIQUE: 11.15 mCi F-18 FDG was injected intravenously. Full-ring PET imaging was performed from the skull base to thigh after the radiotracer. CT data was obtained and used for attenuation correction and anatomic localization. FASTING BLOOD GLUCOSE:  Value: 102 mg/dl COMPARISON:  None. FINDINGS: NECK No hypermetabolic lymph nodes in the neck. CHEST The FDG avid right axillary lymph nodes and epicardial lymph nodes have resolved in the interval. The focal hypermetabolic activity in the left ventricular myocardium described on the previous study is unchanged. No new abnormalities in the chest. ABDOMEN/PELVIS Significant improvement in the abdomen. No definitive FDG avid disease remains in the liver or spleen. The adenopathy in the abdomen has resolved. Minimal uptake in the normal appearing left adrenal gland is of no significance. No abnormal uptake seen in the soft tissues of the pelvis. The omental nodularity seen previously has resolved. There is increased attenuation in the fat  associated with the anterior ventral hernia with low level uptake, likely inflammatory. The maximum SUV is measured on image 161 is 3.7. SKELETON There has been significant overall improvement in the bones as well. Uptake in the anterior left acetabulum has significantly diminished with a maximum SUV of 3.4 today versus 29.4 previously. The left iliac uptake seen previously is no longer significantly FDG avid. There is mild focal uptake in the posterior left iliac bone on series 4, image 166 which was not seen previously and demonstrates a maximum SUV of 4.26 today. No other bony abnormalities are identified. IMPRESSION: 1. Significant interval improvement. No FDG avid disease remains in the soft tissues of the neck, chest, abdomen, or pelvis. Very mild uptake remains in the known left anterior acetabular lesion. Mild new uptake in the posterior left iliac bone is nonspecific but could represent mild involvement. Recommend attention on follow-up. 2. Increased attenuation and mild uptake in the fat of a lower midline ventral hernia is likely inflammatory. Recommend attention on follow-up. 3. No other interval change. Electronically Signed   By: Dorise Bullion III M.D   On: 10/22/2016 15:33   PET/CT 08/06/2016: IMPRESSION: 1. In addition to hypermetabolic nodal involvement of the right axilla, pericardial space, porta hepatis/mesenteric root, and retroperitoneum, there is extensive involvement of the liver as well as peritoneal involvement along the liver surface, splenic surface, and upper omentum. 2. Hypermetabolic bony lesions in the left pelvis compatible with malignant involvement. 3. There is a focus of hypermetabolic activity along the anterolateral wall of the left ventricle. Normally  such findings turn out to simply be hypermetabolic myocardium. Strictly speaking given how focal this appears I cannot completely exclude the possibility of tumor involvement within or along the  myocardium.   Electronically Signed   By: Van Clines M.D.   On: 08/06/2016 12:48     ASSESSMENT & PLAN:   81 yo male with multiple medical co-morbidities including cardiac co-morbidities, IPF, vascular on chronic immunosupression with    1) Stage IV Diffuse large B-cell lymphoma involving the small intestine (germinal cell phenotype) with complete thickness involvement of bowel and abutting serosa.  Initial PET/CT scan shows extensive involvement with DLBCL, hypermetabolic nodal involvement of the right axilla, pericardial space, porta hepatis/mesenteric root, and retroperitoneum, there is extensive involvement of the liver as well as peritoneal involvement along the liver surface, splenic surface, and upper omentum. 2. Hypermetabolic bony lesions in the left pelvis compatible with malignant involvement. 3. There is a focus of hypermetabolic activity along the anterolateral wall of the left ventricle.   s/p 6 cycles of R-CEOP .   PET/CT scan on 10/22/2016 after 3 cycles of chemotherapy shows excellent response to treatment. PET/CT scan on 12/29/2016 after 6 cycles of treatment shows No evidence of residual or recurrent hypermetabolic lymphoma  A)Status post neutropenic fever and E.coli/pseudomonal bacteremia/HCAP after the first cycle of treatment.  Significant chemotherapy related thrombocytopenia - now resolved. B) h/o hospitalization for perforation diverticulitis - no issues currently  2) chronic kidney disease stage III baseline creatinine about 2. Renal function stable . 3) coronary disease status post PCI and CABG - had a myocardial perfusion imaging scan on 06/30/2016 that showed a moderately decreased ejection fraction of 30-44%- following with cardiology for management. 4) history of pulmonary and renal vasculitis previously on chronic Imuran. Now off Imuran considering chemotherapy and Rituxan use.  5) Neutropenia -resolved 6) thrombocytopenia due to chemotherapy  -resolved  Plan -labs are stable. -patient does not have any lab or clinical evidence of lymphoma recurrence at this time . -no indication for additional lymphoma treatment at this time. -port-a-cath removed with IR on 01/20/2017 -f/u with PCP .Donato Heinz, MD of continued mx of other medical co-morbidities and to determine need to go back to Imuran for his vasculitis.                  RTC with Dr Irene Limbo in 3 months with labs. Earlier if any new lymphoma related concerns  All of the patients questions were answered with apparent satisfaction. The patient knows to call the clinic with any problems, questions or concerns.  I spent 20 minutes counseling the patient face to face. The total time spent in the appointment was 25 minutes and more than 50% was on counseling and direct patient cares.    Sullivan Lone MD [place] AAHIVMS [facility] Hematology/Oncology Physician [facility]  (Office):       [phone] (Work cell):  [phone] (Fax):           [phone]   This document serves as a record of services personally performed by Sullivan Lone, MD. It was created on her behalf by Alean Rinne, a trained medical scribe. The creation of this record is based on the scribe's personal observations and the provider's statements to them. This document has been checked and approved by the attending provider.

## 2017-03-07 NOTE — Telephone Encounter (Signed)
Printed avs and calender of upcoming appointment on January 21 per 10/22 los.

## 2017-03-17 NOTE — Progress Notes (Signed)
Cardiology Office Note   Date:  03/18/2017   ID:  Luke Mcbride, DOB 12-Jul-1935, MRN [id number]  PCP:  Donato Heinz, MD  Cardiologist:  Dr. [name]    Chief Complaint  Patient presents with  . Congestive Heart Failure    heart failure       History of Present Illness: Luke Mcbride is a 81 y.o. male who presents for SOB.    He had CABG X 6 in 2002. He was admitted 07/2012 with NSTEMI and underwent BMS to the SVR-PDA/PL. His LIMA to LAD was atretic but disease in the LAD was moderate. SVG-OM and SVG to diagonal were known to be occluded. Other PMH includes DM, HTN, HLD and CKD.   Most recently, in February, he was diagnosed with Stage IV Diffuse large B-cell lymphoma involving the small intestine. He required surgery. Prior to surgery, he had a nuclear stress test on 07/01/16 which showed scar but no ischemia. He was cleared to undergo surgery. He had 6 inchesof his colon resected. Following surgery he had 6 rounds of chemo. He recently had a f/u PET scan and was told he was in remission.   Recently seen for DOE, echo revealed EF 35-40% down from 55%.  Pt was seen back and lopressor changed to toprol XL, with his CKD plan was to avoid ACE/ARB. BIdil is to be added.  He recently had a nuc study in 06/2016 prior to surgery with scar but no ischemia.  No chest pain.  BNP was elevated at 880, lasix increased to 40 daily.   Last Cr was up to 2.16.  No chest pain.  Only short of breath with exertion can only walk 30 yards  On last visit I decrease lasix and added biDil  With thoughts to cath to eval for increased CAD.  CR 10/22 was 2.1   Today pt still with DOE, only can walk 30 yards.  His BP is very low today but no lightheadedness or dizziness but his wife has noted him wanting to sleep a lot.  No chest pain.  Pt tells me know matter what meds he has not felt any different.   He does have hx of interstitial lung disease.  Past Medical History:  Diagnosis Date  . ALLERGIC  RHINITIS   . Anemia    hx  . Barrett's esophagus   . BOOP (bronchiolitis obliterans with organizing pneumonia) ([place])    a. s/p R VATS 2008.  [name] CAD (coronary artery disease)    a. 05/2000: NSTEMI/CABG x 6: LIMA->LAD, VG->D1, VG->OM1->2, VG->PDA->RPL;  b. 07/2007 MV: high lat infarct, no ischemia, EF 47%;  c. Cath/PCI: LM nl, LAD20p, 17m, D1 nl, D2 60-70ost, D3 nl, LCX 70ost, 148m, OM1/OM2 min irregs, RCA 30 diff, PDA 99, LIMA->LAD atretic, VG->D1 100, VG->OM1->2 100, VG->PDA->RPL 90p (4.0x23 Vision BMS);  c. 07/2012 Echo: EF 55%, gr1 DD.  [name] Cataract   . CKD (chronic kidney disease), stage III ([place])    a. renal bx 2008: GLN with vasculitis  . Diffuse large B cell lymphoma ([place]) 08/07/2016  . GERD (gastroesophageal reflux disease)   . Glaucoma    a. Cannot see out of L eye.  . Hematuria    Microscopic  . Hyperglycemia    Patient reported while on prednisone, had to take insulin  . Hyperlipemia   . Hypertension   . Hypothyroidism   . ILD (interstitial lung disease) ([place])   . Iritis   . Local infection of skin and  subcutaneous tissue   . Membranoproliferative nephritis   . Myocardial infarction ([place]) 2002  . Neutropenia, drug-induced ([place])   . Recurrent boils   . Residual foreign body in soft tissue   . Vasculitis ([place])    a.  pauciimmune vasculitis with renal involvement and hx of transient hemoptysis in the past with associated BOOP, 2008 (renal bx 2008: GLN with vasculitis). b. History of treatment with 2 cycles of Cytoxan and pheresis. H/o hemoptysis and pulm hemorrhage with 2nd cycle of cytoxan.    Past Surgical History:  Procedure Laterality Date  . BOWEL RESECTION N/A 07/12/2016   Procedure: SMALL BOWEL RESECTION;  Surgeon: Rolm Bookbinder, MD;  Location: [facility];  Service: General;  Laterality: N/A;  . CATARACT EXTRACTION Bilateral   . CORONARY ARTERY BYPASS GRAFT  2002  . IR FLUORO GUIDE PORT INSERTION RIGHT  08/20/2016  . IR REMOVAL TUN ACCESS W/ PORT W/O FL MOD SED  01/20/2017    . IR US GUIDE VASC ACCESS RIGHT  08/20/2016  . LAPAROSCOPY N/A 07/12/2016   Procedure: LAPAROSCOPY DIAGNOSTIC;  Surgeon: Rolm Bookbinder, MD;  Location: [facility];  Service: General;  Laterality: N/A;  . LEFT HEART CATHETERIZATION WITH CORONARY/GRAFT ANGIOGRAM N/A 07/26/2012   Procedure: LEFT HEART CATHETERIZATION WITH Beatrix Fetters;  Surgeon: Wellington Hampshire, MD;  Location: [facility] CATH LAB;  Service: Cardiovascular;  Laterality: N/A;  . LUNG BIOPSY    . PERCUTANEOUS CORONARY STENT INTERVENTION (PCI-S)  07/26/2012   Procedure: PERCUTANEOUS CORONARY STENT INTERVENTION (PCI-S);  Surgeon: Wellington Hampshire, MD;  Location: [facility] CATH LAB;  Service: Cardiovascular;;  . RENAL BIOPSY    . TOE AMPUTATION  2009   hammer toe     Current Outpatient Prescriptions  Medication Sig Dispense Refill  . acetaminophen (TYLENOL) 500 MG tablet Take 500-1,000 mg by mouth daily as needed for moderate pain or headache.    [name] aspirin EC 81 MG EC tablet Take 1 tablet (81 mg total) by mouth daily. 30 tablet   . clopidogrel (PLAVIX) 75 MG tablet Take 1 tablet (75 mg total) by mouth daily. 90 tablet 1  . finasteride (PROSCAR) 5 MG tablet Take 5 mg by mouth at bedtime.     . furosemide (LASIX) 20 MG tablet Take 1 tablet (20 mg total) by mouth daily. 30 tablet 3  . isosorbide-hydrALAZINE (BIDIL) 20-37.5 MG tablet Take 1 tablet by mouth 2 (two) times daily. 60 tablet 3  . levothyroxine (SYNTHROID, LEVOTHROID) 100 MCG tablet Take 100 mcg by mouth daily.      . metoprolol succinate (TOPROL XL) 25 MG 24 hr tablet Take 1 tablet (25 mg total) by mouth daily. 90 tablet 3  . Multiple Vitamin (MULTIVITAMIN) tablet Take 1 tablet by mouth daily.      . nitroGLYCERIN (NITROSTAT) 0.4 MG SL tablet Place 0.4 mg under the tongue every 5 (five) minutes as needed for chest pain.    . pantoprazole (PROTONIX) 40 MG tablet TAKE 1 TABLET BY MOUTH  DAILY 90 tablet 1  . Tamsulosin HCl (FLOMAX) 0.4 MG CAPS Take 0.4 mg by mouth at bedtime.     [name]  telmisartan (MICARDIS) 40 MG tablet Take 20 mg by mouth daily.      No current facility-administered medications for this visit.    Facility-Administered Medications Ordered in Other Visits  Medication Dose Route Frequency Provider Last Rate Last Dose  . sodium chloride flush (NS) 0.9 % injection 10 mL  10 mL Intracatheter PRN Brunetta Genera, MD  10 mL at 08/24/16 1653  . sodium chloride flush (NS) 0.9 % injection 10 mL  10 mL Intracatheter PRN Brunetta Genera, MD   10 mL at 10/07/16 1555    Allergies:   No known allergies    Social History:  The patient  reports that he has never smoked. He has never used smokeless tobacco. He reports that he does not drink alcohol or use drugs.   Family History:  The patient's family history includes Depression in his other; Diabetes in his mother and other; Heart disease in his mother; Prostate cancer in his father and other.    ROS:  General:no colds or fevers, no weight changes Skin:no rashes or ulcers HEENT:no blurred vision, no congestion CV:see HPI PUL:see HPI GI:no diarrhea constipation or melena, no indigestion GU:no hematuria, no dysuria MS:no joint pain, no claudication Neuro:no syncope, no lightheadedness Endo:no diabetes, + thyroid disease  Wt Readings from Last 3 Encounters:  03/18/17 230 lb 12.8 oz (104.7 kg)  03/07/17 225 lb 4.8 oz (102.2 kg)  02/22/17 226 lb (102.5 kg)     PHYSICAL EXAM: VS:  BP (!) 88/52   Pulse 88   Resp 16   Ht 6\' 1"  (1.854 m)   Wt 230 lb 12.8 oz (104.7 kg)   SpO2 97%   BMI 30.45 kg/m  , BMI Body mass index is 30.45 kg/m. General:Pleasant affect, NAD Skin:Warm and dry, brisk capillary refill HEENT:normocephalic, sclera clear, mucus membranes moist Neck:supple, no JVD, no bruits  Heart:S1S2 RRR without murmur, gallup, rub or click Lungs:clear without rales, rhonchi, or wheezes VZD:GLOV, non tender, + BS, do not palpate liver spleen or masses Ext:no lower ext edema, 2+ pedal pulses,  2+ radial pulses Neuro:alert and oriented, MAE, follows commands, + facial symmetry  BP lying 98/62 P 88 BP sitting 82/50  P 94 BP  Standing 78/64  P 99 BP standing for 3 min 92/65 p 99  No lightheadedness or dizziness.  EKG:  EKG is NOT ordered today.   Recent Labs: 09/01/2016: B Natriuretic Peptide 288.4 01/21/2017: NT-Pro BNP 880 03/07/2017: ALT 16; BUN 36.6; Creatinine 2.1; HGB 10.8; Platelets 176; Potassium 4.5; Sodium 141    Lipid Panel    Component Value Date/Time   CHOL 157 02/11/2014 0733   TRIG 150.0 (H) 02/11/2014 0733   HDL 36.30 (L) 02/11/2014 0733   CHOLHDL 4 02/11/2014 0733   VLDL 30.0 02/11/2014 0733   LDLCALC 91 02/11/2014 0733   LDLDIRECT 221.7 05/23/2007 0000       Other studies Reviewed: Additional studies/ records that were reviewed today include:  Echo 01/18/17 Study Conclusions  - Left ventricle: The cavity size was normal. There was mild focal   basal hypertrophy of the septum. Systolic function was moderately   reduced. The estimated ejection fraction was in the range of 35%   to 40%. There is akinesis of the inferolateral myocardium.   Doppler parameters are consistent with abnormal left ventricular   relaxation (grade 1 diastolic dysfunction). - Mitral valve: Calcified annulus. There was mild regurgitation. - Left atrium: The atrium was mildly dilated. - Atrial septum: There was an atrial septal aneurysm.  Impressions:  - Limited study with definity; full doppler not performed; akinesis   of the inferolateral wall with overall moderately reduced LV   systolic function; mild diastolic dysfunction; mild MR; mild LAE.  ECHO 12/2016  Study Conclusions  - Left ventricle: The cavity size was mildly dilated. There was   mild focal basal hypertrophy of  the septum. Systolic function was   mildly to moderately reduced. The estimated ejection fraction was   in the range of 40% to 45%. Mild diffuse hypokinesis. There was   fusion of early and  atrial contributions to ventricular filling.   The study is not technically sufficient to allow evaluation of LV   diastolic function. - Left atrium: The atrium was mildly dilated. - Right ventricle: The cavity size was mildly dilated. Wall   thickness was normal. - Recommendations: Recommend limited repeat study with definity   contrast to assess wall motion and more accurate assessment of   EF.  Recommendations:  Recommend limited repeat study with definity contrast to assess wall motion and more accurate assessment of EF.   NUC study  06/30/16 study Highlights     The left ventricular ejection fraction is moderately decreased (30-44%).  Nuclear stress EF: 40%.  There was no ST segment deviation noted during stress.  No T wave inversion was noted during stress.  Defect 1: There is a large defect of severe severity present in the basal inferolateral, basal anterolateral, mid inferolateral and mid anterolateral location.  Findings consistent with prior myocardial infarction.  This is an intermediate risk study.   Intermediate risk stress nuclear study with a large scar in the left circumflex coronary artery distribution moderate reduction in left ventricular systolic function.     ASSESSMENT AND PLAN:  1.  DOE with decrease in EF- cardiomyopathy, medication has not helped.  Discussed with Dr. [name] and will plan for Rt and lt cardiac cath with graft visualization.  Will give IV Bicarb pre cath.  I will notify Dr. Marval Regal of plans with recent labs. He will hold micardis and lasix the day before procedure and day of.  The patient understands that risks included but are not limited to stroke (1 in 1000), death (1 in 71), kidney failure [usually temporary] (1 in 500), bleeding (1 in 200), allergic reaction [possibly serious] (1 in 200).   2.  CRF with Cr at 2.0 will recheck today see above.  3.  Hypotension will decrease bidil to once daily at bedtime. With his BP he  may not tolerate.    4.   CAD s/p CABG    known graft occlusion of VG to Diag  And occl VG to OM and PCI in 2014 to VG to rt PDA/PL with BMS, also with atretic LIMA though the native LAD had only moderate nonobstructive disease.  5.  Interstitial lung disease, if cath is stable will refer to pulmonology   6.  Diffuse Large B cell lymphoma treated and in remission.   7.  HLD stable.    Current medicines are reviewed with the patient today.  The patient Has no concerns regarding medicines.  The following changes have been made:  See above Labs/ tests ordered today include:see above  Disposition:   FU:  see above  Signed, [name], NP  03/18/2017 9:56 AM    [address], [place], [place], [place] Phone: [phone]; Fax: [phone]

## 2017-03-17 NOTE — H&P (View-Only) (Signed)
Cardiology Office Note   Date:  03/18/2017   ID:  Luke Mcbride, DOB 1935-10-01, MRN [id number]  PCP:  Donato Heinz, MD  Cardiologist:  Dr. [name]    Chief Complaint  Patient presents with  . Congestive Heart Failure    heart failure       History of Present Illness: Luke Mcbride is a 81 y.o. male who presents for SOB.    He had CABG X 6 in 2002. He was admitted 07/2012 with NSTEMI and underwent BMS to the SVR-PDA/PL. His LIMA to LAD was atretic but disease in the LAD was moderate. SVG-OM and SVG to diagonal were known to be occluded. Other PMH includes DM, HTN, HLD and CKD.   Most recently, in February, he was diagnosed with Stage IV Diffuse large B-cell lymphoma involving the small intestine. He required surgery. Prior to surgery, he had a nuclear stress test on 07/01/16 which showed scar but no ischemia. He was cleared to undergo surgery. He had 6 inchesof his colon resected. Following surgery he had 6 rounds of chemo. He recently had a f/u PET scan and was told he was in remission.   Recently seen for DOE, echo revealed EF 35-40% down from 55%.  Pt was seen back and lopressor changed to toprol XL, with his CKD plan was to avoid ACE/ARB. BIdil is to be added.  He recently had a nuc study in 06/2016 prior to surgery with scar but no ischemia.  No chest pain.  BNP was elevated at 880, lasix increased to 40 daily.   Last Cr was up to 2.16.  No chest pain.  Only short of breath with exertion can only walk 30 yards  On last visit I decrease lasix and added biDil  With thoughts to cath to eval for increased CAD.  CR 10/22 was 2.1   Today pt still with DOE, only can walk 30 yards.  His BP is very low today but no lightheadedness or dizziness but his wife has noted him wanting to sleep a lot.  No chest pain.  Pt tells me know matter what meds he has not felt any different.   He does have hx of interstitial lung disease.  Past Medical History:  Diagnosis Date  . ALLERGIC  RHINITIS   . Anemia    hx  . Barrett's esophagus   . BOOP (bronchiolitis obliterans with organizing pneumonia) ([place])    a. s/p R VATS 2008.  [name] CAD (coronary artery disease)    a. 05/2000: NSTEMI/CABG x 6: LIMA->LAD, VG->D1, VG->OM1->2, VG->PDA->RPL;  b. 07/2007 MV: high lat infarct, no ischemia, EF 47%;  c. Cath/PCI: LM nl, LAD20p, 39m, D1 nl, D2 60-70ost, D3 nl, LCX 70ost, 155m, OM1/OM2 min irregs, RCA 30 diff, PDA 99, LIMA->LAD atretic, VG->D1 100, VG->OM1->2 100, VG->PDA->RPL 90p (4.0x23 Vision BMS);  c. 07/2012 Echo: EF 55%, gr1 DD.  [name] Cataract   . CKD (chronic kidney disease), stage III ([place])    a. renal bx 2008: GLN with vasculitis  . Diffuse large B cell lymphoma (Luke Mcbride) 08/07/2016  . GERD (gastroesophageal reflux disease)   . Glaucoma    a. Cannot see out of L eye.  . Hematuria    Microscopic  . Hyperglycemia    Patient reported while on prednisone, had to take insulin  . Hyperlipemia   . Hypertension   . Hypothyroidism   . ILD (interstitial lung disease) ([place])   . Iritis   . Local infection of skin and  subcutaneous tissue   . Membranoproliferative nephritis   . Myocardial infarction ([place]) 2002  . Neutropenia, drug-induced ([place])   . Recurrent boils   . Residual foreign body in soft tissue   . Vasculitis ([place])    a.  pauciimmune vasculitis with renal involvement and hx of transient hemoptysis in the past with associated BOOP, 2008 (renal bx 2008: GLN with vasculitis). b. History of treatment with 2 cycles of Cytoxan and pheresis. H/o hemoptysis and pulm hemorrhage with 2nd cycle of cytoxan.    Past Surgical History:  Procedure Laterality Date  . BOWEL RESECTION N/A 07/12/2016   Procedure: SMALL BOWEL RESECTION;  Surgeon: Rolm Bookbinder, MD;  Location: [facility];  Service: General;  Laterality: N/A;  . CATARACT EXTRACTION Bilateral   . CORONARY ARTERY BYPASS GRAFT  2002  . IR FLUORO GUIDE PORT INSERTION RIGHT  08/20/2016  . IR REMOVAL TUN ACCESS W/ PORT W/O FL MOD SED  01/20/2017    . IR US GUIDE VASC ACCESS RIGHT  08/20/2016  . LAPAROSCOPY N/A 07/12/2016   Procedure: LAPAROSCOPY DIAGNOSTIC;  Surgeon: Rolm Bookbinder, MD;  Location: [facility];  Service: General;  Laterality: N/A;  . LEFT HEART CATHETERIZATION WITH CORONARY/GRAFT ANGIOGRAM N/A 07/26/2012   Procedure: LEFT HEART CATHETERIZATION WITH Beatrix Fetters;  Surgeon: Wellington Hampshire, MD;  Location: [facility] CATH LAB;  Service: Cardiovascular;  Laterality: N/A;  . LUNG BIOPSY    . PERCUTANEOUS CORONARY STENT INTERVENTION (PCI-S)  07/26/2012   Procedure: PERCUTANEOUS CORONARY STENT INTERVENTION (PCI-S);  Surgeon: Wellington Hampshire, MD;  Location: [facility] C[facility];  Service: Cardiovascular;;  . RENAL BIOPSY    . TOE AMPUTATION  2009   hammer toe     Current Outpatient Prescriptions  Medication Sig Dispense Refill  . acetaminophen (TYLENOL) 500 MG tablet Take 500-1,000 mg by mouth daily as needed for moderate pain or headache.    [name] aspirin EC 81 MG EC tablet Take 1 tablet (81 mg total) by mouth daily. 30 tablet   . clopidogrel (PLAVIX) 75 MG tablet Take 1 tablet (75 mg total) by mouth daily. 90 tablet 1  . finasteride (PROSCAR) 5 MG tablet Take 5 mg by mouth at bedtime.     . furosemide (LASIX) 20 MG tablet Take 1 tablet (20 mg total) by mouth daily. 30 tablet 3  . isosorbide-hydrALAZINE (BIDIL) 20-37.5 MG tablet Take 1 tablet by mouth 2 (two) times daily. 60 tablet 3  . levothyroxine (SYNTHROID, LEVOTHROID) 100 MCG tablet Take 100 mcg by mouth daily.      . metoprolol succinate (TOPROL XL) 25 MG 24 hr tablet Take 1 tablet (25 mg total) by mouth daily. 90 tablet 3  . Multiple Vitamin (MULTIVITAMIN) tablet Take 1 tablet by mouth daily.      . nitroGLYCERIN (NITROSTAT) 0.4 MG SL tablet Place 0.4 mg under the tongue every 5 (five) minutes as needed for chest pain.    . pantoprazole (PROTONIX) 40 MG tablet TAKE 1 TABLET BY MOUTH  DAILY 90 tablet 1  . Tamsulosin HCl (FLOMAX) 0.4 MG CAPS Take 0.4 mg by mouth at bedtime.     [name]  telmisartan (MICARDIS) 40 MG tablet Take 20 mg by mouth daily.      No current facility-administered medications for this visit.    Facility-Administered Medications Ordered in Other Visits  Medication Dose Route Frequency Provider Last Rate Last Dose  . sodium chloride flush (NS) 0.9 % injection 10 mL  10 mL Intracatheter PRN Brunetta Genera, MD  10 mL at 08/24/16 1653  . sodium chloride flush (NS) 0.9 % injection 10 mL  10 mL Intracatheter PRN Brunetta Genera, MD   10 mL at 10/07/16 1555    Allergies:   No known allergies    Social History:  The patient  reports that he has never smoked. He has never used smokeless tobacco. He reports that he does not drink alcohol or use drugs.   Family History:  The patient's family history includes Depression in his other; Diabetes in his mother and other; Heart disease in his mother; Prostate cancer in his father and other.    ROS:  General:no colds or fevers, no weight changes Skin:no rashes or ulcers HEENT:no blurred vision, no congestion CV:see HPI PUL:see HPI GI:no diarrhea constipation or melena, no indigestion GU:no hematuria, no dysuria MS:no joint pain, no claudication Neuro:no syncope, no lightheadedness Endo:no diabetes, + thyroid disease  Wt Readings from Last 3 Encounters:  03/18/17 230 lb 12.8 oz (104.7 kg)  03/07/17 225 lb 4.8 oz (102.2 kg)  02/22/17 226 lb (102.5 kg)     PHYSICAL EXAM: VS:  BP (!) 88/52   Pulse 88   Resp 16   Ht 6\' 1"  (1.854 m)   Wt 230 lb 12.8 oz (104.7 kg)   SpO2 97%   BMI 30.45 kg/m  , BMI Body mass index is 30.45 kg/m. General:Pleasant affect, NAD Skin:Warm and dry, brisk capillary refill HEENT:normocephalic, sclera clear, mucus membranes moist Neck:supple, no JVD, no bruits  Heart:S1S2 RRR without murmur, gallup, rub or click Lungs:clear without rales, rhonchi, or wheezes XBM:WUXL, non tender, + BS, do not palpate liver spleen or masses Ext:no lower ext edema, 2+ pedal pulses,  2+ radial pulses Neuro:alert and oriented, MAE, follows commands, + facial symmetry  BP lying 98/62 P 88 BP sitting 82/50  P 94 BP  Standing 78/64  P 99 BP standing for 3 min 92/65 p 99  No lightheadedness or dizziness.  EKG:  EKG is NOT ordered today.   Recent Labs: 09/01/2016: B Natriuretic Peptide 288.4 01/21/2017: NT-Pro BNP 880 03/07/2017: ALT 16; BUN 36.6; Creatinine 2.1; HGB 10.8; Platelets 176; Potassium 4.5; Sodium 141    Lipid Panel    Component Value Date/Time   CHOL 157 02/11/2014 0733   TRIG 150.0 (H) 02/11/2014 0733   HDL 36.30 (L) 02/11/2014 0733   CHOLHDL 4 02/11/2014 0733   VLDL 30.0 02/11/2014 0733   LDLCALC 91 02/11/2014 0733   LDLDIRECT 221.7 05/23/2007 0000       Other studies Reviewed: Additional studies/ records that were reviewed today include:  Echo 01/18/17 Study Conclusions  - Left ventricle: The cavity size was normal. There was mild focal   basal hypertrophy of the septum. Systolic function was moderately   reduced. The estimated ejection fraction was in the range of 35%   to 40%. There is akinesis of the inferolateral myocardium.   Doppler parameters are consistent with abnormal left ventricular   relaxation (grade 1 diastolic dysfunction). - Mitral valve: Calcified annulus. There was mild regurgitation. - Left atrium: The atrium was mildly dilated. - Atrial septum: There was an atrial septal aneurysm.  Impressions:  - Limited study with definity; full doppler not performed; akinesis   of the inferolateral wall with overall moderately reduced LV   systolic function; mild diastolic dysfunction; mild MR; mild LAE.  ECHO 12/2016  Study Conclusions  - Left ventricle: The cavity size was mildly dilated. There was   mild focal basal hypertrophy of  the septum. Systolic function was   mildly to moderately reduced. The estimated ejection fraction was   in the range of 40% to 45%. Mild diffuse hypokinesis. There was   fusion of early and  atrial contributions to ventricular filling.   The study is not technically sufficient to allow evaluation of LV   diastolic function. - Left atrium: The atrium was mildly dilated. - Right ventricle: The cavity size was mildly dilated. Wall   thickness was normal. - Recommendations: Recommend limited repeat study with definity   contrast to assess wall motion and more accurate assessment of   EF.  Recommendations:  Recommend limited repeat study with definity contrast to assess wall motion and more accurate assessment of EF.   NUC study  06/30/16 study Highlights     The left ventricular ejection fraction is moderately decreased (30-44%).  Nuclear stress EF: 40%.  There was no ST segment deviation noted during stress.  No T wave inversion was noted during stress.  Defect 1: There is a large defect of severe severity present in the basal inferolateral, basal anterolateral, mid inferolateral and mid anterolateral location.  Findings consistent with prior myocardial infarction.  This is an intermediate risk study.   Intermediate risk stress nuclear study with a large scar in the left circumflex coronary artery distribution moderate reduction in left ventricular systolic function.     ASSESSMENT AND PLAN:  1.  DOE with decrease in EF- cardiomyopathy, medication has not helped.  Discussed with Dr. [name] and will plan for Rt and lt cardiac cath with graft visualization.  Will give IV Bicarb pre cath.  I will notify Dr. Marval Regal of plans with recent labs. He will hold micardis and lasix the day before procedure and day of.  The patient understands that risks included but are not limited to stroke (1 in 1000), death (1 in 31), kidney failure [usually temporary] (1 in 500), bleeding (1 in 200), allergic reaction [possibly serious] (1 in 200).   2.  CRF with Cr at 2.0 will recheck today see above.  3.  Hypotension will decrease bidil to once daily at bedtime. With his BP he  may not tolerate.    4.   CAD s/p CABG    known graft occlusion of VG to Diag  And occl VG to OM and PCI in 2014 to VG to rt PDA/PL with BMS, also with atretic LIMA though the native LAD had only moderate nonobstructive disease.  5.  Interstitial lung disease, if cath is stable will refer to pulmonology   6.  Diffuse Large B cell lymphoma treated and in remission.   7.  HLD stable.    Current medicines are reviewed with the patient today.  The patient Has no concerns regarding medicines.  The following changes have been made:  See above Labs/ tests ordered today include:see above  Disposition:   FU:  see above  Signed, [name], NP  03/18/2017 9:56 AM    [facility], [place], [place], [place] Phone: [phone]; Fax: [phone]

## 2017-03-18 ENCOUNTER — Ambulatory Visit (INDEPENDENT_AMBULATORY_CARE_PROVIDER_SITE_OTHER): Payer: Medicare Other | Admitting: Cardiology

## 2017-03-18 ENCOUNTER — Encounter: Payer: Self-pay | Admitting: Cardiology

## 2017-03-18 VITALS — BP 88/52 | HR 88 | Resp 16 | Ht 73.0 in | Wt 230.8 lb

## 2017-03-18 DIAGNOSIS — R0609 Other forms of dyspnea: Secondary | ICD-10-CM

## 2017-03-18 DIAGNOSIS — E782 Mixed hyperlipidemia: Secondary | ICD-10-CM

## 2017-03-18 DIAGNOSIS — I2510 Atherosclerotic heart disease of native coronary artery without angina pectoris: Secondary | ICD-10-CM

## 2017-03-18 DIAGNOSIS — I5020 Unspecified systolic (congestive) heart failure: Secondary | ICD-10-CM | POA: Diagnosis not present

## 2017-03-18 DIAGNOSIS — Z01812 Encounter for preprocedural laboratory examination: Secondary | ICD-10-CM

## 2017-03-18 DIAGNOSIS — I959 Hypotension, unspecified: Secondary | ICD-10-CM

## 2017-03-18 DIAGNOSIS — J849 Interstitial pulmonary disease, unspecified: Secondary | ICD-10-CM

## 2017-03-18 DIAGNOSIS — I2583 Coronary atherosclerosis due to lipid rich plaque: Secondary | ICD-10-CM

## 2017-03-18 LAB — BASIC METABOLIC PANEL
BUN/Creatinine Ratio: 16 (ref 10–24)
BUN: 31 mg/dL — ABNORMAL HIGH (ref 8–27)
CALCIUM: 8.9 mg/dL (ref 8.6–10.2)
CHLORIDE: 106 mmol/L (ref 96–106)
CO2: 19 mmol/L — AB (ref 20–29)
Creatinine, Ser: 1.95 mg/dL — ABNORMAL HIGH (ref 0.76–1.27)
GFR calc Af Amer: 37 mL/min/{1.73_m2} — ABNORMAL LOW (ref >59)
GFR, EST NON AFRICAN AMERICAN: 32 mL/min/{1.73_m2} — AB (ref >59)
GLUCOSE: 79 mg/dL (ref 65–99)
POTASSIUM: 4.9 mmol/L (ref 3.5–5.2)
SODIUM: 143 mmol/L (ref 134–144)

## 2017-03-18 LAB — CBC WITH DIFFERENTIAL/PLATELET
BASOS ABS: 0 10*3/uL (ref 0.0–0.2)
Basos: 1 %
EOS (ABSOLUTE): 0.1 10*3/uL (ref 0.0–0.4)
Eos: 1 %
Hematocrit: 34.1 % — ABNORMAL LOW (ref 37.5–51.0)
Hemoglobin: 10.9 g/dL — ABNORMAL LOW (ref 13.0–17.7)
IMMATURE GRANS (ABS): 0 10*3/uL (ref 0.0–0.1)
IMMATURE GRANULOCYTES: 1 %
LYMPHS: 11 %
Lymphocytes Absolute: 0.7 10*3/uL (ref 0.7–3.1)
MCH: 31.1 pg (ref 26.6–33.0)
MCHC: 32 g/dL (ref 31.5–35.7)
MCV: 97 fL (ref 79–97)
MONOS ABS: 1.2 10*3/uL — AB (ref 0.1–0.9)
Monocytes: 19 %
NEUTROS PCT: 67 %
Neutrophils Absolute: 4.2 10*3/uL (ref 1.4–7.0)
PLATELETS: 205 10*3/uL (ref 150–379)
RBC: 3.51 x10E6/uL — ABNORMAL LOW (ref 4.14–5.80)
RDW: 16.5 % — AB (ref 12.3–15.4)
WBC: 6.2 10*3/uL (ref 3.4–10.8)

## 2017-03-18 LAB — PROTIME-INR
INR: 1 (ref 0.8–1.2)
Prothrombin Time: 10.4 s (ref 9.1–12.0)

## 2017-03-18 NOTE — Patient Instructions (Addendum)
Medication Instructions: Your physician recommends that you continue on your current medications as directed. Please refer to the Current Medication list given to you today.  Labwork: Your physician recommends that you have lab work today: BMET, CBC, PT/INR   Procedures/Testing: Your physician has requested that you have a cardiac catheterization. Cardiac catheterization is used to diagnose and/or treat various heart conditions. Doctors may recommend this procedure for a number of different reasons. The most common reason is to evaluate chest pain. Chest pain can be a symptom of coronary artery disease (CAD), and cardiac catheterization can show whether plaque is narrowing or blocking your heart's arteries. This procedure is also used to evaluate the valves, as well as measure the blood flow and oxygen levels in different parts of your heart. For further information please visit [employer]uge[employer]iest[employer]tn. Please follow instruction sheet, as given.  Follow-Up: Your physician recommends that you schedule a follow-up appointment in: 2 WEEKS from 03/22/17 with Dr. [name] or an APP on Dr. Kyla Balzarine Team      [facility] CARDIOVASCULAR DIVISION [place] OFFICE [address], [place] [address] [place] [address] Dept: [phone] Loc: [phone]  Adrian Saran  03/18/2017  You are scheduled for a Cardiac Catheterization on Wednesday, March 23, 2017 with Dr. Peter Martinique.  1. Please arrive at the [facility] (Main Entrance A) at [facility]: [address] [place], [place] [address] at 7:30 am . Free valet parking service is available.   Special note: Every effort is made to have your procedure done on time. Please understand that emergencies sometimes delay scheduled procedures.  2. Diet: Do not eat or drink anything after midnight prior to your procedure except sips of water to take medications.  3. Labs: You will have labs drawn in office  today 03/18/17  4. DO NOT take your LASIX the day before and the morning of your procedure.      STOP TAKING your MICARDIS 2 days prior and the morning of your procedure     On the morning of your procedure, take your Aspirin and any other morning medication with sips of water.  5. Plan for one night stay--bring personal belongings. 6. Bring a current list of your medications and current insurance cards. 7. You MUST have a responsible person to drive you home. 8. Someone MUST be with you the first 24 hours after you arrive home or your discharge will be delayed. 9. Please wear clothes that are easy to get on and off and wear slip-on shoes.  Thank you for allowing [place] to care for you!   -- [facility]

## 2017-03-21 ENCOUNTER — Telehealth: Payer: Self-pay

## 2017-03-21 NOTE — Telephone Encounter (Signed)
Notified Pt arrival time is 7:30 am for sodium bicarb prior to cath.  Pt indicates understanding.

## 2017-03-21 NOTE — Telephone Encounter (Signed)
Patient contacted pre-catheterization at [facility] scheduled for:  03/23/2017 @ 1130 Verified arrival time and place:  NT @ 0900 Confirmed AM meds to be taken pre-cath with sip of water: Take ASA and Plavix Hold Micardis 2 days prior to procedure-last dose Sunday 11/4 Hold lasix 1 day prior -Last dose 03/21/2017 Confirmed patient has responsible person to drive home post procedure and observe patient for 24 hours:  yes Addl concerns:  none

## 2017-03-23 ENCOUNTER — Other Ambulatory Visit: Payer: Self-pay | Admitting: Cardiology

## 2017-03-23 ENCOUNTER — Ambulatory Visit (HOSPITAL_COMMUNITY)
Admission: RE | Admit: 2017-03-23 | Discharge: 2017-03-23 | Disposition: A | Discharge status: Home / Self Care | Payer: Medicare Other | Source: Ambulatory Visit | Attending: Cardiology | Admitting: Cardiology

## 2017-03-23 ENCOUNTER — Encounter (HOSPITAL_COMMUNITY)
Admission: RE | Disposition: A | Discharge status: Home / Self Care | Payer: Self-pay | Source: Ambulatory Visit | Attending: Cardiology

## 2017-03-23 DIAGNOSIS — I959 Hypotension, unspecified: Secondary | ICD-10-CM | POA: Insufficient documentation

## 2017-03-23 DIAGNOSIS — Z7982 Long term (current) use of aspirin: Secondary | ICD-10-CM | POA: Insufficient documentation

## 2017-03-23 DIAGNOSIS — Z7902 Long term (current) use of antithrombotics/antiplatelets: Secondary | ICD-10-CM | POA: Diagnosis not present

## 2017-03-23 DIAGNOSIS — H409 Unspecified glaucoma: Secondary | ICD-10-CM | POA: Insufficient documentation

## 2017-03-23 DIAGNOSIS — Z79899 Other long term (current) drug therapy: Secondary | ICD-10-CM | POA: Insufficient documentation

## 2017-03-23 DIAGNOSIS — Z7983 Long term (current) use of bisphosphonates: Secondary | ICD-10-CM | POA: Diagnosis not present

## 2017-03-23 DIAGNOSIS — N183 Chronic kidney disease, stage 3 (moderate): Secondary | ICD-10-CM | POA: Diagnosis not present

## 2017-03-23 DIAGNOSIS — I252 Old myocardial infarction: Secondary | ICD-10-CM | POA: Diagnosis not present

## 2017-03-23 DIAGNOSIS — I130 Hypertensive heart and chronic kidney disease with heart failure and stage 1 through stage 4 chronic kidney disease, or unspecified chronic kidney disease: Secondary | ICD-10-CM | POA: Diagnosis not present

## 2017-03-23 DIAGNOSIS — I25810 Atherosclerosis of coronary artery bypass graft(s) without angina pectoris: Secondary | ICD-10-CM | POA: Insufficient documentation

## 2017-03-23 DIAGNOSIS — E785 Hyperlipidemia, unspecified: Secondary | ICD-10-CM | POA: Insufficient documentation

## 2017-03-23 DIAGNOSIS — I209 Angina pectoris, unspecified: Secondary | ICD-10-CM

## 2017-03-23 DIAGNOSIS — C8330 Diffuse large B-cell lymphoma, unspecified site: Secondary | ICD-10-CM | POA: Insufficient documentation

## 2017-03-23 DIAGNOSIS — N189 Chronic kidney disease, unspecified: Secondary | ICD-10-CM

## 2017-03-23 DIAGNOSIS — E039 Hypothyroidism, unspecified: Secondary | ICD-10-CM | POA: Insufficient documentation

## 2017-03-23 DIAGNOSIS — I25119 Atherosclerotic heart disease of native coronary artery with unspecified angina pectoris: Secondary | ICD-10-CM | POA: Diagnosis not present

## 2017-03-23 DIAGNOSIS — I509 Heart failure, unspecified: Secondary | ICD-10-CM | POA: Insufficient documentation

## 2017-03-23 DIAGNOSIS — E7800 Pure hypercholesterolemia, unspecified: Secondary | ICD-10-CM | POA: Diagnosis present

## 2017-03-23 HISTORY — PX: RIGHT/LEFT HEART CATH AND CORONARY/GRAFT ANGIOGRAPHY: CATH118267

## 2017-03-23 LAB — BASIC METABOLIC PANEL
Anion gap: 7 (ref 5–15)
BUN: 38 mg/dL — ABNORMAL HIGH (ref 6–20)
CALCIUM: 8.8 mg/dL — AB (ref 8.9–10.3)
CHLORIDE: 108 mmol/L (ref 101–111)
CO2: 23 mmol/L (ref 22–32)
CREATININE: 2.11 mg/dL — AB (ref 0.61–1.24)
GFR calc Af Amer: 33 mL/min — ABNORMAL LOW (ref >60)
GFR calc non Af Amer: 28 mL/min — ABNORMAL LOW (ref >60)
GLUCOSE: 131 mg/dL — AB (ref 65–99)
Potassium: 4.4 mmol/L (ref 3.5–5.1)
Sodium: 138 mmol/L (ref 135–145)

## 2017-03-23 LAB — POCT I-STAT 3, VENOUS BLOOD GAS (G3P V)
ACID-BASE DEFICIT: 2 mmol/L (ref 0.0–2.0)
Bicarbonate: 23.2 mmol/L (ref 20.0–28.0)
O2 Saturation: 68 %
PCO2 VEN: 38.9 mmHg — AB (ref 44.0–60.0)
TCO2: 24 mmol/L (ref 22–32)
pH, Ven: 7.384 (ref 7.250–7.430)
pO2, Ven: 36 mmHg (ref 32.0–45.0)

## 2017-03-23 LAB — POCT I-STAT 3, ART BLOOD GAS (G3+)
ACID-BASE DEFICIT: 3 mmol/L — AB (ref 0.0–2.0)
Bicarbonate: 21.3 mmol/L (ref 20.0–28.0)
O2 SAT: 93 %
PCO2 ART: 35 mmHg (ref 32.0–48.0)
TCO2: 22 mmol/L (ref 22–32)
pH, Arterial: 7.392 (ref 7.350–7.450)
pO2, Arterial: 67 mmHg — ABNORMAL LOW (ref 83.0–108.0)

## 2017-03-23 SURGERY — RIGHT/LEFT HEART CATH AND CORONARY/GRAFT ANGIOGRAPHY
Anesthesia: LOCAL

## 2017-03-23 MED ORDER — LIDOCAINE HCL (PF) 1 % IJ SOLN
INTRAMUSCULAR | Status: AC
  Filled 2017-03-23: qty 30

## 2017-03-23 MED ORDER — HEPARIN (PORCINE) IN NACL 2-0.9 UNIT/ML-% IJ SOLN
INTRAMUSCULAR | Status: AC | PRN
  Administered 2017-03-23: 1000 mL

## 2017-03-23 MED ORDER — FENTANYL CITRATE (PF) 100 MCG/2ML IJ SOLN
INTRAMUSCULAR | Status: DC | PRN
  Administered 2017-03-23: 25 ug via INTRAVENOUS

## 2017-03-23 MED ORDER — HEPARIN (PORCINE) IN NACL 2-0.9 UNIT/ML-% IJ SOLN
INTRAMUSCULAR | Status: AC
  Filled 2017-03-23: qty 1000

## 2017-03-23 MED ORDER — MIDAZOLAM HCL 2 MG/2ML IJ SOLN
INTRAMUSCULAR | Status: AC
  Filled 2017-03-23: qty 2

## 2017-03-23 MED ORDER — SODIUM CHLORIDE 0.9% FLUSH: 3.0000 mL | Freq: Two times a day (BID) | INTRAVENOUS | Status: DC

## 2017-03-23 MED ORDER — IOPAMIDOL (ISOVUE-370) INJECTION 76%
INTRAVENOUS | Status: AC
  Filled 2017-03-23: qty 100

## 2017-03-23 MED ORDER — SODIUM CHLORIDE 0.9 % WEIGHT BASED INFUSION: 1.0000 mL/kg/h | INTRAVENOUS | Status: DC

## 2017-03-23 MED ORDER — SODIUM CHLORIDE 0.9 % IV SOLN: 250.0000 mL | INTRAVENOUS | Status: DC | PRN

## 2017-03-23 MED ORDER — SODIUM BICARBONATE BOLUS VIA INFUSION
INTRAVENOUS | Status: AC
  Administered 2017-03-23: 150 meq via INTRAVENOUS
  Filled 2017-03-23: qty 1

## 2017-03-23 MED ORDER — SODIUM BICARBONATE 8.4 % IV SOLN
INTRAVENOUS | Status: DC
  Filled 2017-03-23: qty 1000

## 2017-03-23 MED ORDER — FENTANYL CITRATE (PF) 100 MCG/2ML IJ SOLN
INTRAMUSCULAR | Status: AC
  Filled 2017-03-23: qty 2

## 2017-03-23 MED ORDER — ASPIRIN 81 MG PO CHEW: 81.0000 mg | CHEWABLE_TABLET | ORAL | Status: DC

## 2017-03-23 MED ORDER — IOPAMIDOL (ISOVUE-370) INJECTION 76%
INTRAVENOUS | Status: DC | PRN
  Administered 2017-03-23: 85 mL via INTRA_ARTERIAL

## 2017-03-23 MED ORDER — SODIUM CHLORIDE 0.9 % IV SOLN
INTRAVENOUS | Status: DC
  Administered 2017-03-23: 09:00:00 via INTRAVENOUS

## 2017-03-23 MED ORDER — SODIUM CHLORIDE 0.9% FLUSH: 3.0000 mL | INTRAVENOUS | Status: DC | PRN

## 2017-03-23 MED ORDER — SODIUM CHLORIDE 0.9% FLUSH
3.0000 mL | INTRAVENOUS | Status: DC | PRN
Start: 2017-03-23 — End: 2017-03-23

## 2017-03-23 MED ORDER — MIDAZOLAM HCL 2 MG/2ML IJ SOLN
INTRAMUSCULAR | Status: DC | PRN
  Administered 2017-03-23: 1 mg via INTRAVENOUS

## 2017-03-23 MED ORDER — LIDOCAINE HCL (PF) 1 % IJ SOLN
INTRAMUSCULAR | Status: DC | PRN
  Administered 2017-03-23: 15 mL

## 2017-03-23 SURGICAL SUPPLY — 14 items
CATH INFINITI 5 FR IM (CATHETERS) ×1 IMPLANT
CATH INFINITI 5FR AL1 (CATHETERS) ×1 IMPLANT
CATH INFINITI 5FR MULTPACK ANG (CATHETERS) ×1 IMPLANT
CATH LAUNCHER 5F JL5 (CATHETERS) IMPLANT
CATH SWAN GANZ 7F STRAIGHT (CATHETERS) ×1 IMPLANT
CATHETER LAUNCHER 5F JL5 (CATHETERS) ×2
KIT HEART LEFT (KITS) ×2 IMPLANT
KIT HEART RIGHT NAMIC (KITS) ×1 IMPLANT
PACK CARDIAC CATHETERIZATION (CUSTOM PROCEDURE TRAY) ×2 IMPLANT
SHEATH PINNACLE 5F 10CM (SHEATH) ×1 IMPLANT
SHEATH PINNACLE 7F 10CM (SHEATH) ×1 IMPLANT
TRANSDUCER W/STOPCOCK (MISCELLANEOUS) ×2 IMPLANT
TUBING CIL FLEX 10 FLL-RA (TUBING) ×2 IMPLANT
WIRE EMERALD 3MM-J .035X150CM (WIRE) ×1 IMPLANT

## 2017-03-23 NOTE — Progress Notes (Signed)
71F sheath pulled from R femoral artery and 42f sheath pulled from R femoral vein by Tessie Eke. 20 minutes manual pressure applied. Site level 0 with no hematoma noted. Site dressed with tegaderm and 4x4. Instructions given to pt. Bedrest begins at 1120. DP +1.

## 2017-03-23 NOTE — Interval H&P Note (Signed)
History and Physical Interval Note:  03/23/2017 9:39 AM  Luke Mcbride  has presented today for surgery, with the diagnosis of cad  The various methods of treatment have been discussed with the patient and family. After consideration of risks, benefits and other options for treatment, the patient has consented to  Procedure(s): RIGHT/LEFT HEART CATH AND CORONARY/GRAFT ANGIOGRAPHY (N/A) as a surgical intervention .  The patient's history has been reviewed, patient examined, no change in status, stable for surgery.  I have reviewed the patient's chart and labs.  Questions were answered to the patient's satisfaction.    Cath Lab Visit (complete for each Cath Lab visit)  Clinical Evaluation Leading to the Procedure:   ACS: No.  Non-ACS:    Anginal Classification: CCS III  Anti-ischemic medical therapy: Maximal Therapy (2 or more classes of medications)  Non-Invasive Test Results: High-risk stress test findings: cardiac mortality >3%/year  Prior CABG: Previous CABG       [facility] 03/23/2017 9:39 AM

## 2017-03-23 NOTE — Discharge Instructions (Addendum)
Do not take lasix  We will arrange to draw blood work and check your renal function on Friday November 9.  If renal function is stable we will scheduled you for intervention with stenting next week.  Angiogram, Care After This sheet gives you information about how to care for yourself after your procedure. Your health care provider may also give you more specific instructions. If you have problems or questions, contact your health care provider. What can I expect after the procedure? After the procedure, it is common to have bruising and tenderness at the catheter insertion area. Follow these instructions at home: Insertion site care  Follow instructions from your health care provider about how to take care of your insertion site. Make sure you: ? Wash your hands with soap and water before you change your bandage (dressing). If soap and water are not available, use hand sanitizer. ? Change your dressing as told by your health care provider. ? Leave stitches (sutures), skin glue, or adhesive strips in place. These skin closures may need to stay in place for 2 weeks or longer. If adhesive strip edges start to loosen and curl up, you may trim the loose edges. Do not remove adhesive strips completely unless your health care provider tells you to do that.  Do not take baths, swim, or use a hot tub until your health care provider approves.  You may shower 24-48 hours after the procedure or as told by your health care provider. ? Gently wash the site with plain soap and water. ? Pat the area dry with a clean towel. ? Do not rub the site. This may cause bleeding.  Do not apply powder or lotion to the site. Keep the site clean and dry.  Check your insertion site every day for signs of infection. Check for: ? Redness, swelling, or pain. ? Fluid or blood. ? Warmth. ? Pus or a bad smell. Activity  Rest as told by your health care provider, usually for 1-2 days.  Do not lift anything that is  heavier than 10 lbs. (4.5 kg) or as told by your health care provider.  Do not drive for 24 hours if you were given a medicine to help you relax (sedative).  Do not drive or use heavy machinery while taking prescription pain medicine. General instructions  Return to your normal activities as told by your health care provider, usually in about a week. Ask your health care provider what activities are safe for you.  If the catheter site starts bleeding, lie flat and put pressure on the site. If the bleeding does not stop, get help right away. This is a medical emergency.  Drink enough fluid to keep your urine clear or pale yellow. This helps flush the contrast dye from your body.  Take over-the-counter and prescription medicines only as told by your health care provider.  Keep all follow-up visits as told by your health care provider. This is important. Contact a health care provider if:  You have a fever or chills.  You have redness, swelling, or pain around your insertion site.  You have fluid or blood coming from your insertion site.  The insertion site feels warm to the touch.  You have pus or a bad smell coming from your insertion site.  You have bruising around the insertion site.  You notice blood collecting in the tissue around the catheter site (hematoma). The hematoma may be painful to the touch. Get help right away if:  You  have severe pain at the catheter insertion area.  The catheter insertion area swells very fast.  The catheter insertion area is bleeding, and the bleeding does not stop when you hold steady pressure on the area.  The area near or just beyond the catheter insertion site becomes pale, cool, tingly, or numb. These symptoms may represent a serious problem that is an emergency. Do not wait to see if the symptoms will go away. Get medical help right away. Call your local emergency services (911 in the [place] Do not drive yourself to the  hospital. Summary  After the procedure, it is common to have bruising and tenderness at the catheter insertion area.  After the procedure, it is important to rest and drink plenty of fluids.  Do not take baths, swim, or use a hot tub until your health care provider says it is okay to do so. You may shower 24-48 hours after the procedure or as told by your health care provider.  If the catheter site starts bleeding, lie flat and put pressure on the site. If the bleeding does not stop, get help right away. This is a medical emergency. This information is not intended to replace advice given to you by your health care provider. Make sure you discuss any questions you have with your health care provider. Document Released: 11/19/2004 Document Revised: 04/07/2016 Document Reviewed: 04/07/2016 Elsevier Interactive Patient Education  2017 [employer].

## 2017-03-24 ENCOUNTER — Encounter (HOSPITAL_COMMUNITY): Payer: Self-pay | Admitting: Cardiology

## 2017-03-25 ENCOUNTER — Other Ambulatory Visit: Payer: Medicare Other | Admitting: *Deleted

## 2017-03-25 DIAGNOSIS — N189 Chronic kidney disease, unspecified: Secondary | ICD-10-CM

## 2017-03-25 DIAGNOSIS — N184 Chronic kidney disease, stage 4 (severe): Secondary | ICD-10-CM

## 2017-03-25 LAB — BASIC METABOLIC PANEL
BUN/Creatinine Ratio: 17 (ref 10–24)
BUN: 30 mg/dL — ABNORMAL HIGH (ref 8–27)
CO2: 21 mmol/L (ref 20–29)
CREATININE: 1.75 mg/dL — AB (ref 0.76–1.27)
Calcium: 8.3 mg/dL — ABNORMAL LOW (ref 8.6–10.2)
Chloride: 103 mmol/L (ref 96–106)
GFR calc Af Amer: 42 mL/min/{1.73_m2} — ABNORMAL LOW (ref >59)
GFR, EST NON AFRICAN AMERICAN: 36 mL/min/{1.73_m2} — AB (ref >59)
Glucose: 92 mg/dL (ref 65–99)
Potassium: 4.5 mmol/L (ref 3.5–5.2)
SODIUM: 137 mmol/L (ref 134–144)

## 2017-03-28 ENCOUNTER — Encounter: Payer: Self-pay | Admitting: Cardiology

## 2017-03-30 ENCOUNTER — Inpatient Hospital Stay (HOSPITAL_COMMUNITY)
Admission: AD | Admit: 2017-03-30 | Discharge: 2017-04-01 | DRG: 247 | Disposition: A | Discharge status: Home / Self Care | Payer: Medicare Other | Source: Ambulatory Visit | Attending: Interventional Cardiology | Admitting: Interventional Cardiology

## 2017-03-30 ENCOUNTER — Other Ambulatory Visit: Payer: Self-pay

## 2017-03-30 ENCOUNTER — Encounter (HOSPITAL_COMMUNITY): Payer: Self-pay | Admitting: Cardiology

## 2017-03-30 DIAGNOSIS — E1122 Type 2 diabetes mellitus with diabetic chronic kidney disease: Secondary | ICD-10-CM | POA: Diagnosis present

## 2017-03-30 DIAGNOSIS — Z9221 Personal history of antineoplastic chemotherapy: Secondary | ICD-10-CM

## 2017-03-30 DIAGNOSIS — K2270 Barrett's esophagus without dysplasia: Secondary | ICD-10-CM | POA: Diagnosis present

## 2017-03-30 DIAGNOSIS — J42 Unspecified chronic bronchitis: Secondary | ICD-10-CM | POA: Diagnosis not present

## 2017-03-30 DIAGNOSIS — I25810 Atherosclerosis of coronary artery bypass graft(s) without angina pectoris: Principal | ICD-10-CM | POA: Diagnosis present

## 2017-03-30 DIAGNOSIS — Z87448 Personal history of other diseases of urinary system: Secondary | ICD-10-CM | POA: Diagnosis not present

## 2017-03-30 DIAGNOSIS — I130 Hypertensive heart and chronic kidney disease with heart failure and stage 1 through stage 4 chronic kidney disease, or unspecified chronic kidney disease: Secondary | ICD-10-CM | POA: Diagnosis present

## 2017-03-30 DIAGNOSIS — N183 Chronic kidney disease, stage 3 (moderate): Secondary | ICD-10-CM | POA: Diagnosis not present

## 2017-03-30 DIAGNOSIS — Z89429 Acquired absence of other toe(s), unspecified side: Secondary | ICD-10-CM | POA: Diagnosis not present

## 2017-03-30 DIAGNOSIS — Z9049 Acquired absence of other specified parts of digestive tract: Secondary | ICD-10-CM | POA: Diagnosis not present

## 2017-03-30 DIAGNOSIS — J8489 Other specified interstitial pulmonary diseases: Secondary | ICD-10-CM | POA: Diagnosis present

## 2017-03-30 DIAGNOSIS — E669 Obesity, unspecified: Secondary | ICD-10-CM | POA: Diagnosis not present

## 2017-03-30 DIAGNOSIS — E039 Hypothyroidism, unspecified: Secondary | ICD-10-CM | POA: Diagnosis present

## 2017-03-30 DIAGNOSIS — R109 Unspecified abdominal pain: Secondary | ICD-10-CM | POA: Diagnosis not present

## 2017-03-30 DIAGNOSIS — I129 Hypertensive chronic kidney disease with stage 1 through stage 4 chronic kidney disease, or unspecified chronic kidney disease: Secondary | ICD-10-CM | POA: Diagnosis not present

## 2017-03-30 DIAGNOSIS — I2510 Atherosclerotic heart disease of native coronary artery without angina pectoris: Secondary | ICD-10-CM | POA: Diagnosis not present

## 2017-03-30 DIAGNOSIS — R0609 Other forms of dyspnea: Secondary | ICD-10-CM

## 2017-03-30 DIAGNOSIS — N400 Enlarged prostate without lower urinary tract symptoms: Secondary | ICD-10-CM | POA: Diagnosis not present

## 2017-03-30 DIAGNOSIS — Z7982 Long term (current) use of aspirin: Secondary | ICD-10-CM

## 2017-03-30 DIAGNOSIS — H409 Unspecified glaucoma: Secondary | ICD-10-CM | POA: Diagnosis present

## 2017-03-30 DIAGNOSIS — E785 Hyperlipidemia, unspecified: Secondary | ICD-10-CM | POA: Diagnosis present

## 2017-03-30 DIAGNOSIS — I5042 Chronic combined systolic (congestive) and diastolic (congestive) heart failure: Secondary | ICD-10-CM | POA: Diagnosis present

## 2017-03-30 DIAGNOSIS — K219 Gastro-esophageal reflux disease without esophagitis: Secondary | ICD-10-CM | POA: Diagnosis present

## 2017-03-30 DIAGNOSIS — Z79899 Other long term (current) drug therapy: Secondary | ICD-10-CM

## 2017-03-30 DIAGNOSIS — N013 Rapidly progressive nephritic syndrome with diffuse mesangial proliferative glomerulonephritis: Secondary | ICD-10-CM | POA: Diagnosis not present

## 2017-03-30 DIAGNOSIS — E7800 Pure hypercholesterolemia, unspecified: Secondary | ICD-10-CM | POA: Diagnosis not present

## 2017-03-30 DIAGNOSIS — Z95828 Presence of other vascular implants and grafts: Secondary | ICD-10-CM

## 2017-03-30 DIAGNOSIS — I25118 Atherosclerotic heart disease of native coronary artery with other forms of angina pectoris: Secondary | ICD-10-CM

## 2017-03-30 DIAGNOSIS — I252 Old myocardial infarction: Secondary | ICD-10-CM

## 2017-03-30 DIAGNOSIS — I25119 Atherosclerotic heart disease of native coronary artery with unspecified angina pectoris: Secondary | ICD-10-CM | POA: Diagnosis not present

## 2017-03-30 DIAGNOSIS — C8330 Diffuse large B-cell lymphoma, unspecified site: Secondary | ICD-10-CM | POA: Diagnosis present

## 2017-03-30 DIAGNOSIS — Z955 Presence of coronary angioplasty implant and graft: Secondary | ICD-10-CM

## 2017-03-30 DIAGNOSIS — R402414 Glasgow coma scale score 13-15, 24 hours or more after hospital admission: Secondary | ICD-10-CM | POA: Diagnosis not present

## 2017-03-30 DIAGNOSIS — H5462 Unqualified visual loss, left eye, normal vision right eye: Secondary | ICD-10-CM | POA: Diagnosis present

## 2017-03-30 DIAGNOSIS — Z7902 Long term (current) use of antithrombotics/antiplatelets: Secondary | ICD-10-CM

## 2017-03-30 DIAGNOSIS — R05 Cough: Secondary | ICD-10-CM | POA: Diagnosis not present

## 2017-03-30 LAB — CBC WITH DIFFERENTIAL/PLATELET
BASOS ABS: 0 10*3/uL (ref 0.0–0.1)
Basophils Relative: 0 %
Eosinophils Absolute: 0.1 10*3/uL (ref 0.0–0.7)
Eosinophils Relative: 3 %
HEMATOCRIT: 33.2 % — AB (ref 39.0–52.0)
Hemoglobin: 10.5 g/dL — ABNORMAL LOW (ref 13.0–17.0)
LYMPHS PCT: 10 %
Lymphs Abs: 0.4 10*3/uL — ABNORMAL LOW (ref 0.7–4.0)
MCH: 31.2 pg (ref 26.0–34.0)
MCHC: 31.6 g/dL (ref 30.0–36.0)
MCV: 98.5 fL (ref 78.0–100.0)
MONO ABS: 0.7 10*3/uL (ref 0.1–1.0)
Monocytes Relative: 17 %
NEUTROS ABS: 3.1 10*3/uL (ref 1.7–7.7)
Neutrophils Relative %: 70 %
Platelets: 170 10*3/uL (ref 150–400)
RBC: 3.37 MIL/uL — AB (ref 4.22–5.81)
RDW: 16.8 % — ABNORMAL HIGH (ref 11.5–15.5)
WBC: 4.4 10*3/uL (ref 4.0–10.5)

## 2017-03-30 LAB — BASIC METABOLIC PANEL
Anion gap: 6 (ref 5–15)
BUN: 24 mg/dL — ABNORMAL HIGH (ref 6–20)
CO2: 22 mmol/L (ref 22–32)
Calcium: 8.7 mg/dL — ABNORMAL LOW (ref 8.9–10.3)
Chloride: 110 mmol/L (ref 101–111)
Creatinine, Ser: 1.82 mg/dL — ABNORMAL HIGH (ref 0.61–1.24)
GFR calc Af Amer: 39 mL/min — ABNORMAL LOW (ref >60)
GFR, EST NON AFRICAN AMERICAN: 34 mL/min — AB (ref >60)
GLUCOSE: 98 mg/dL (ref 65–99)
POTASSIUM: 4.2 mmol/L (ref 3.5–5.1)
SODIUM: 138 mmol/L (ref 135–145)

## 2017-03-30 MED ORDER — TAMSULOSIN HCL 0.4 MG PO CAPS
0.4000 mg | ORAL_CAPSULE | Freq: Every day | ORAL | Status: DC
  Administered 2017-03-30 – 2017-03-31 (×2): 0.4 mg via ORAL
  Filled 2017-03-30 (×3): qty 1

## 2017-03-30 MED ORDER — SODIUM CHLORIDE 0.9 % IV SOLN
INTRAVENOUS | Status: DC
  Administered 2017-03-30: 16:00:00 via INTRAVENOUS

## 2017-03-30 MED ORDER — NITROGLYCERIN 0.4 MG SL SUBL: 0.4000 mg | SUBLINGUAL_TABLET | SUBLINGUAL | Status: DC | PRN

## 2017-03-30 MED ORDER — HEPARIN SODIUM (PORCINE) 5000 UNIT/ML IJ SOLN
5000.0000 [IU] | Freq: Three times a day (TID) | INTRAMUSCULAR | Status: DC
  Administered 2017-03-30 – 2017-03-31 (×3): 5000 [IU] via SUBCUTANEOUS
  Filled 2017-03-30 (×3): qty 1

## 2017-03-30 MED ORDER — FINASTERIDE 5 MG PO TABS
5.0000 mg | ORAL_TABLET | Freq: Every day | ORAL | Status: DC
  Administered 2017-03-30 – 2017-03-31 (×2): 5 mg via ORAL
  Filled 2017-03-30 (×3): qty 1

## 2017-03-30 MED ORDER — SODIUM CHLORIDE 0.9 % IV SOLN: 250.0000 mL | INTRAVENOUS | Status: DC | PRN

## 2017-03-30 MED ORDER — LEVOTHYROXINE SODIUM 100 MCG PO TABS
100.0000 ug | ORAL_TABLET | Freq: Every day | ORAL | Status: DC
  Administered 2017-03-31 – 2017-04-01 (×2): 100 ug via ORAL
  Filled 2017-03-30 (×2): qty 1

## 2017-03-30 MED ORDER — METOPROLOL SUCCINATE ER 25 MG PO TB24
25.0000 mg | ORAL_TABLET | Freq: Every day | ORAL | Status: DC
  Administered 2017-04-01: 25 mg via ORAL
  Filled 2017-03-30 (×2): qty 1

## 2017-03-30 MED ORDER — ACETAMINOPHEN 325 MG PO TABS
650.0000 mg | ORAL_TABLET | ORAL | Status: DC | PRN
  Administered 2017-03-31: 650 mg via ORAL

## 2017-03-30 MED ORDER — CLOPIDOGREL BISULFATE 75 MG PO TABS
75.0000 mg | ORAL_TABLET | Freq: Every day | ORAL | Status: DC
  Administered 2017-03-31 – 2017-04-01 (×2): 75 mg via ORAL
  Filled 2017-03-30 (×3): qty 1

## 2017-03-30 MED ORDER — ISOSORB DINITRATE-HYDRALAZINE 20-37.5 MG PO TABS
1.0000 | ORAL_TABLET | Freq: Two times a day (BID) | ORAL | Status: DC
  Administered 2017-03-30 – 2017-04-01 (×4): 1 via ORAL
  Filled 2017-03-30 (×4): qty 1

## 2017-03-30 MED ORDER — ONDANSETRON HCL 4 MG/2ML IJ SOLN: 4.0000 mg | Freq: Four times a day (QID) | INTRAMUSCULAR | Status: DC | PRN

## 2017-03-30 MED ORDER — ASPIRIN EC 81 MG PO TBEC
81.0000 mg | DELAYED_RELEASE_TABLET | Freq: Every day | ORAL | Status: DC
  Administered 2017-03-31 – 2017-04-01 (×2): 81 mg via ORAL
  Filled 2017-03-30 (×3): qty 1

## 2017-03-30 MED ORDER — PANTOPRAZOLE SODIUM 40 MG PO TBEC
40.0000 mg | DELAYED_RELEASE_TABLET | Freq: Every day | ORAL | Status: DC
  Administered 2017-04-01: 40 mg via ORAL
  Filled 2017-03-30 (×2): qty 1

## 2017-03-30 NOTE — H&P (Signed)
History & Physical    Patient ID: Luke Mcbride MRN: [id number], DOB/AGE: 79/01/1936   Admit date: 03/30/2017   Primary Physician: Donato Heinz, MD Primary Cardiologist: [name]  Patient Profile    81 yo male with PMH of CABG x6 ('02), HTN, Hypothyroidism, CKD, DM, B-cell lymphoma, chronic combined HF who presented for pre-hydration for cath with PCI tomorrow.    Past Medical History   Past Medical History:  Diagnosis Date  . ALLERGIC RHINITIS   . Anemia    hx  . Barrett's esophagus   . BOOP (bronchiolitis obliterans with organizing pneumonia) ([place])    a. s/p R VATS 2008.  [name] CAD (coronary artery disease)    a. 05/2000: NSTEMI/CABG x 6: LIMA->LAD, VG->D1, VG->OM1->2, VG->PDA->RPL;  b. 07/2007 MV: high lat infarct, no ischemia, EF 47%;  c. Cath/PCI: LM nl, LAD20p, 31m, D1 nl, D2 60-70ost, D3 nl, LCX 70ost, 188m, OM1/OM2 min irregs, RCA 30 diff, PDA 99, LIMA->LAD atretic, VG->D1 100, VG->OM1->2 100, VG->PDA->RPL 90p (4.0x23 Vision BMS);  c. 07/2012 Echo: EF 55%, gr1 DD.  [name] Cataract   . CKD (chronic kidney disease), stage III ([place])    a. renal bx 2008: GLN with vasculitis  . Diffuse large B cell lymphoma ([place]) 08/07/2016  . GERD (gastroesophageal reflux disease)   . Glaucoma    a. Cannot see out of L eye.  . Hematuria    Microscopic  . Hyperglycemia    Patient reported while on prednisone, had to take insulin  . Hyperlipemia   . Hypertension   . Hypothyroidism   . ILD (interstitial lung disease) ([place])   . Iritis   . Local infection of skin and subcutaneous tissue   . Membranoproliferative nephritis   . Myocardial infarction ([place]) 2002  . Neutropenia, drug-induced ([place])   . Recurrent boils   . Residual foreign body in soft tissue   . Vasculitis ([place])    a.  pauciimmune vasculitis with renal involvement and hx of transient hemoptysis in the past with associated BOOP, 2008 (renal bx 2008: GLN with vasculitis). b. History of treatment with 2 cycles of Cytoxan and  pheresis. H/o hemoptysis and pulm hemorrhage with 2nd cycle of cytoxan.    Past Surgical History:  Procedure Laterality Date  . CATARACT EXTRACTION Bilateral   . CORONARY ARTERY BYPASS GRAFT  2002  . IR FLUORO GUIDE PORT INSERTION RIGHT  08/20/2016  . IR REMOVAL TUN ACCESS W/ PORT W/O FL MOD SED  01/20/2017  . IR US GUIDE VASC ACCESS RIGHT  08/20/2016  . LUNG BIOPSY    . RENAL BIOPSY    . TOE AMPUTATION  2009   hammer toe     Allergies  No Known Allergies  History of Present Illness    Luke Mcbride is a 81 yo male with PMH of CABG x6 ('02), HTN, Hypothyroidism, CKD, DM, chronic combined HF and B cell Lymphoma. Seen in the office on 2/18 for pre op clearance and underwent stress test that showed scar but no ischemia. Underwent colon resection and chemotherapy. He was seen in the office on 11/2 by [name] and reported ongoing DOE. Appears to have had medications adjustments with no improvement in symptoms. Last echo showed reduced EF of 35%. Given his progression in symptoms and newly reduced EF decision was made to proceed with outpatient cath.   Presented on 11/7 and underwent cath with Dr. Martinique showing severe 3 vessel disease with patent SVG to PDA/PLOM, and occluded SVG--Diag, and SVG--OM with  atretic LIMA to the LAD. Given his hx of renal dysfunction it was felt he should come back for staged intervention to the mLAD. He was instructed to hold his lasix post cath.   Presents back now for pre admission for cath. Overall symptoms have been stable. Recheck of Cr 1.75 (03/25/17). Respiratory status has been stable.   Home Medications    Prior to Admission medications   Medication Sig Start Date End Date Taking? Authorizing Provider  acetaminophen (TYLENOL) 500 MG tablet Take 500-1,000 mg by mouth daily as needed for moderate pain or headache.    [provider]  aspirin EC 81 MG EC tablet Take 1 tablet (81 mg total) by mouth daily. 07/29/12   Rogelia Mire, NP    bacitracin-polymyxin b (POLYSPORIN) ointment Apply 1 application topically daily as needed (wound care).    [provider]  clopidogrel (PLAVIX) 75 MG tablet Take 1 tablet (75 mg total) by mouth daily. 01/21/17   Lyda Jester M, PA-C  dorzolamide-timolol (COSOPT) 22.3-6.8 MG/ML ophthalmic solution Place 1 drop into both eyes 2 (two) times daily.     [provider]  finasteride (PROSCAR) 5 MG tablet Take 5 mg by mouth at bedtime.     [provider]  isosorbide-hydrALAZINE (BIDIL) 20-37.5 MG tablet Take 1 tablet by mouth 2 (two) times daily. Patient taking differently: Take 1 tablet by mouth at bedtime.  02/22/17   Isaiah Serge, NP  levothyroxine (SYNTHROID, LEVOTHROID) 100 MCG tablet Take 100 mcg by mouth daily before breakfast.     [provider]  metoprolol succinate (TOPROL XL) 25 MG 24 hr tablet Take 1 tablet (25 mg total) by mouth daily. 01/24/17   Consuelo Pandy, PA-C  Multiple Vitamin (MULTIVITAMIN) tablet Take 1 tablet by mouth daily.      [provider]  nitroGLYCERIN (NITROSTAT) 0.4 MG SL tablet Place 0.4 mg under the tongue every 5 (five) minutes as needed for chest pain.    [provider]  pantoprazole (PROTONIX) 40 MG tablet TAKE 1 TABLET BY MOUTH  DAILY 09/23/16   [name], MD  Tamsulosin HCl (FLOMAX) 0.4 MG CAPS Take 0.4 mg by mouth at bedtime.     [provider]  telmisartan (MICARDIS) 40 MG tablet Take 20 mg by mouth daily.     [provider]    Family History    Family History  Problem Relation Age of Onset  . Heart disease Mother   . Diabetes Mother   . Prostate cancer Father   . Depression Other   . Diabetes Other   . Prostate cancer Other   . Colon polyps Neg Hx   . Colon cancer Neg Hx   . Rectal cancer Neg Hx   . Stomach cancer Neg Hx   . Esophageal cancer Neg Hx     Social History    Social History   Socioeconomic History  . Marital status: Married    Spouse  name: Not on file  . Number of children: Not on file  . Years of education: Not on file  . Highest education level: Not on file  Social Needs  . Financial resource strain: Not on file  . Food insecurity - worry: Not on file  . Food insecurity - inability: Not on file  . Transportation needs - medical: Not on file  . Transportation needs - non-medical: Not on file  Occupational History  . Occupation: [employer]  .  Smoking status: Never Smoker  . Smokeless tobacco: Never Used  Substance and Sexual Activity  . Alcohol use: No  . Drug use: No  . Sexual activity: Not on file  Other Topics Concern  . Not on file  Social History Narrative   Regular exercise - yes      [facility] (07/12/16):   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  no pets currently. No bird or mold exposure.     Review of Systems    See HPI  All other systems reviewed and are otherwise negative except as noted above.  Physical Exam    Blood pressure 132/76, pulse 78, temperature (!) 97.5 F (36.4 C), temperature source Oral, resp. rate 20, height 6\' 1"  (1.854 m), weight 226 lb 6.4 oz (102.7 kg), SpO2 96 %.  General: Pleasant, older WM, NAD Psych: Normal affect. Neuro: Alert and oriented X 3. Moves all extremities spontaneously. HEENT: Normal  Neck: Supple, no JVD. Lungs:  Resp regular and unlabored, diminished bilaterally. Heart: RRR no s3, s4, or murmurs. Abdomen: Soft, non-tender, non-distended, BS + x 4.  Extremities: No clubbing, cyanosis, 1+ LE edema. DP/PT/Radials 2+ and equal bilaterally.  Labs    Troponin (Point of Care Test) No results for input(s): TROPIPOC in the last 72 hours. No results for input(s): CKTOTAL, CKMB, TROPONINI in the last 72 hours. Lab Results  Component Value Date   WBC 6.2 03/18/2017   HGB 10.9 (L) 03/18/2017   HCT 34.1 (L) 03/18/2017   MCV 97 03/18/2017   PLT 205 03/18/2017    Recent Labs  Lab 03/25/17 1026  NA 137  K  4.5  CL 103  CO2 21  BUN 30*  CREATININE 1.75*  CALCIUM 8.3*  GLUCOSE 92   Lab Results  Component Value Date   CHOL 157 02/11/2014   HDL 36.30 (L) 02/11/2014   LDLCALC 91 02/11/2014   TRIG 150.0 (H) 02/11/2014   No results found for: [facility]   Radiology Studies    No results found.  ECG & Cardiac Imaging    Cath: 03/23/17  Conclusion     LV end diastolic pressure is normal.  Prox LAD to Mid LAD lesion is 90% stenosed.  Ost 1st Diag lesion is 95% stenosed.  Ost Cx to Prox Cx lesion is 80% stenosed.  Ost 2nd Mrg to 2nd Mrg lesion is 85% stenosed.  Ost RPDA to RPDA lesion is 100% stenosed.  Post Atrio lesion is 80% stenosed.  Origin to Prox Graft lesion before RPDA is 25% stenosed.  Origin to Prox Graft lesion is 100% stenosed.  Origin lesion is 100% stenosed.  Prox Graft to Mid Graft lesion is 100% stenosed.   1. Severe 3 vessel obstructive CAD. 2. Patent SVG to PDA/PLOM. Stent in proximal SVG is patent 3. Occluded SVG to the first diagonal. The first diagonal is small in caliber 4. Occluded SVG to OM 5. Atretic LIMA to the LAD 6. Normal LVEDP 7. Normal right heart pressures.   Plan: The culprit for this patient's symptoms is the new high grade stenosis in the mid LAD which was not present previously. I would recommend PCI of the mid LAD with stenting. Given his renal insufficiency this would be best done as a staged procedure. Will hydrate for 6 hours today. Repeat renal panel in 48 hours. If renal function is stable will plan to return next week for PCI.    Assessment & Plan    81 yo male  with PMH of CABG x6 ('02), HTN, Hypothyroidism, CKD, DM, B-cell lymphoma, chronic combined HF who presented for pre-hydration for cath with PCI tomorrow.    1. CAD s/p CABG ('02): Underwent cardiac cath last week with Dr. Martinique, now planned for PCI to the mLAD tomorrow pending labs.  -- The patient understands that risks included but are not limited to stroke (1 in  1000), death (1 in 12), kidney failure [usually temporary] (1 in 500), bleeding (1 in 200), allergic reaction [possibly serious] (1 in 200).  -- continue ASA, plavix, BB, imdur  2. CKD: Cr at time of cath was elevated at 2. He has held his lasix with outpt Cr improved to 1.7.  -- recheck BMET now, and again in the am.  -- given gentle IVF at 50cc/hr  3. Chronic combined HF: Overall appears volume stable on exam. Monitor volume status closely.   4. HTN: stable, will hold home ARB  5. Hypothyroidism: on synthroid   Barnet Pall, NP-C Pager [phone] 03/30/2017, 10:59 AM

## 2017-03-31 ENCOUNTER — Inpatient Hospital Stay (HOSPITAL_COMMUNITY)
Admission: AD | Disposition: A | Discharge status: Home / Self Care | Payer: Self-pay | Source: Ambulatory Visit | Attending: Interventional Cardiology

## 2017-03-31 ENCOUNTER — Other Ambulatory Visit: Payer: Self-pay

## 2017-03-31 ENCOUNTER — Ambulatory Visit (HOSPITAL_COMMUNITY): Admission: RE | Admit: 2017-03-31 | Payer: Medicare Other | Source: Ambulatory Visit | Admitting: Cardiology

## 2017-03-31 ENCOUNTER — Encounter (HOSPITAL_COMMUNITY): Payer: Self-pay | Admitting: Cardiology

## 2017-03-31 DIAGNOSIS — N183 Chronic kidney disease, stage 3 (moderate): Secondary | ICD-10-CM

## 2017-03-31 HISTORY — PX: CORONARY STENT INTERVENTION: CATH118234

## 2017-03-31 LAB — BASIC METABOLIC PANEL
Anion gap: 6 (ref 5–15)
BUN: 26 mg/dL — AB (ref 6–20)
CHLORIDE: 111 mmol/L (ref 101–111)
CO2: 22 mmol/L (ref 22–32)
CREATININE: 1.78 mg/dL — AB (ref 0.61–1.24)
Calcium: 8 mg/dL — ABNORMAL LOW (ref 8.9–10.3)
GFR calc Af Amer: 40 mL/min — ABNORMAL LOW (ref >60)
GFR calc non Af Amer: 35 mL/min — ABNORMAL LOW (ref >60)
GLUCOSE: 108 mg/dL — AB (ref 65–99)
POTASSIUM: 4.1 mmol/L (ref 3.5–5.1)
SODIUM: 139 mmol/L (ref 135–145)

## 2017-03-31 LAB — CBC
HEMATOCRIT: 29.2 % — AB (ref 39.0–52.0)
Hemoglobin: 9.2 g/dL — ABNORMAL LOW (ref 13.0–17.0)
MCH: 30.7 pg (ref 26.0–34.0)
MCHC: 31.5 g/dL (ref 30.0–36.0)
MCV: 97.3 fL (ref 78.0–100.0)
PLATELETS: 151 10*3/uL (ref 150–400)
RBC: 3 MIL/uL — ABNORMAL LOW (ref 4.22–5.81)
RDW: 16.9 % — AB (ref 11.5–15.5)
WBC: 4 10*3/uL (ref 4.0–10.5)

## 2017-03-31 LAB — PROTIME-INR
INR: 0.99
Prothrombin Time: 13 seconds (ref 11.4–15.2)

## 2017-03-31 LAB — POCT ACTIVATED CLOTTING TIME: ACTIVATED CLOTTING TIME: 384 s

## 2017-03-31 SURGERY — CORONARY STENT INTERVENTION
Anesthesia: LOCAL

## 2017-03-31 MED ORDER — SODIUM CHLORIDE 0.9% FLUSH: 3.0000 mL | INTRAVENOUS | Status: DC | PRN

## 2017-03-31 MED ORDER — SODIUM CHLORIDE 0.9 % WEIGHT BASED INFUSION
1.0000 mL/kg/h | INTRAVENOUS | Status: AC
  Administered 2017-03-31: 1 mL/kg/h via INTRAVENOUS

## 2017-03-31 MED ORDER — ANGIOPLASTY BOOK
Freq: Once | Status: AC
  Administered 2017-03-31: 22:00:00
  Filled 2017-03-31: qty 1

## 2017-03-31 MED ORDER — ROSUVASTATIN CALCIUM 10 MG PO TABS
10.0000 mg | ORAL_TABLET | ORAL | Status: DC
  Administered 2017-04-01: 07:00:00 10 mg via ORAL
  Filled 2017-03-31: qty 1

## 2017-03-31 MED ORDER — BIVALIRUDIN TRIFLUOROACETATE 250 MG IV SOLR
INTRAVENOUS | Status: DC | PRN
  Administered 2017-03-31: 1.75 mg/kg/h via INTRAVENOUS

## 2017-03-31 MED ORDER — HEPARIN SODIUM (PORCINE) 1000 UNIT/ML IJ SOLN
INTRAMUSCULAR | Status: AC
  Filled 2017-03-31: qty 1

## 2017-03-31 MED ORDER — SODIUM CHLORIDE 0.9% FLUSH
3.0000 mL | Freq: Two times a day (BID) | INTRAVENOUS | Status: DC
  Administered 2017-03-31: 3 mL via INTRAVENOUS

## 2017-03-31 MED ORDER — SODIUM CHLORIDE 0.9 % IV SOLN: 250.0000 mL | INTRAVENOUS | Status: DC | PRN

## 2017-03-31 MED ORDER — MIDAZOLAM HCL 2 MG/2ML IJ SOLN
INTRAMUSCULAR | Status: AC
  Filled 2017-03-31: qty 2

## 2017-03-31 MED ORDER — NITROGLYCERIN 1 MG/10 ML FOR IR/CATH LAB
INTRA_ARTERIAL | Status: AC
  Filled 2017-03-31: qty 10

## 2017-03-31 MED ORDER — IOPAMIDOL (ISOVUE-370) INJECTION 76%
INTRAVENOUS | Status: AC
  Filled 2017-03-31: qty 100

## 2017-03-31 MED ORDER — FENTANYL CITRATE (PF) 100 MCG/2ML IJ SOLN
INTRAMUSCULAR | Status: AC
  Filled 2017-03-31: qty 2

## 2017-03-31 MED ORDER — HEPARIN (PORCINE) IN NACL 2-0.9 UNIT/ML-% IJ SOLN
INTRAMUSCULAR | Status: AC
  Filled 2017-03-31: qty 1000

## 2017-03-31 MED ORDER — MORPHINE SULFATE (PF) 4 MG/ML IV SOLN: 2.0000 mg | INTRAVENOUS | Status: DC | PRN

## 2017-03-31 MED ORDER — SODIUM CHLORIDE 0.9% FLUSH: 3.0000 mL | Freq: Two times a day (BID) | INTRAVENOUS | Status: DC

## 2017-03-31 MED ORDER — LIDOCAINE HCL (PF) 1 % IJ SOLN
INTRAMUSCULAR | Status: AC
  Filled 2017-03-31: qty 30

## 2017-03-31 MED ORDER — BIVALIRUDIN BOLUS VIA INFUSION - CUPID
INTRAVENOUS | Status: DC | PRN
  Administered 2017-03-31: 77.325 mg via INTRAVENOUS

## 2017-03-31 MED ORDER — MORPHINE SULFATE (PF) 10 MG/ML IV SOLN: 2.0000 mg | INTRAVENOUS | Status: DC | PRN

## 2017-03-31 MED ORDER — LIDOCAINE HCL (PF) 1 % IJ SOLN
INTRAMUSCULAR | Status: DC | PRN
  Administered 2017-03-31: 15 mL

## 2017-03-31 MED ORDER — IOPAMIDOL (ISOVUE-370) INJECTION 76%
INTRAVENOUS | Status: DC | PRN
Start: 2017-03-31 — End: 2017-03-31
  Administered 2017-03-31: 30 mL via INTRA_ARTERIAL

## 2017-03-31 MED ORDER — HEPARIN SODIUM (PORCINE) 5000 UNIT/ML IJ SOLN
5000.0000 [IU] | Freq: Three times a day (TID) | INTRAMUSCULAR | Status: DC
  Administered 2017-04-01: 5000 [IU] via SUBCUTANEOUS
  Filled 2017-03-31: qty 1

## 2017-03-31 MED ORDER — HEPARIN (PORCINE) IN NACL 2-0.9 UNIT/ML-% IJ SOLN
INTRAMUSCULAR | Status: AC | PRN
  Administered 2017-03-31: 1000 mL

## 2017-03-31 MED ORDER — MIDAZOLAM HCL 2 MG/2ML IJ SOLN
INTRAMUSCULAR | Status: DC | PRN
  Administered 2017-03-31: 1 mg via INTRAVENOUS

## 2017-03-31 MED ORDER — BIVALIRUDIN TRIFLUOROACETATE 250 MG IV SOLR
INTRAVENOUS | Status: AC
  Filled 2017-03-31: qty 250

## 2017-03-31 MED ORDER — FENTANYL CITRATE (PF) 100 MCG/2ML IJ SOLN
INTRAMUSCULAR | Status: DC | PRN
  Administered 2017-03-31 (×2): 50 ug via INTRAVENOUS

## 2017-03-31 MED ORDER — ACETAMINOPHEN 325 MG PO TABS
ORAL_TABLET | ORAL | Status: AC
  Filled 2017-03-31: qty 2

## 2017-03-31 MED ORDER — OXYCODONE HCL 5 MG PO TABS: 5.0000 mg | ORAL_TABLET | ORAL | Status: DC | PRN

## 2017-03-31 SURGICAL SUPPLY — 17 items
BALLN EUPHORA RX 2.5X15 (BALLOONS) ×2
BALLN ~~LOC~~ EUPHORA RX 3.25X20 (BALLOONS) ×2
BALLOON EUPHORA RX 2.5X15 (BALLOONS) IMPLANT
BALLOON ~~LOC~~ EUPHORA RX 3.25X20 (BALLOONS) IMPLANT
CATH INFINITI JR4 5F (CATHETERS) ×1 IMPLANT
CATH VISTA GUIDE 6FR XBLAD3.5 (CATHETERS) ×1 IMPLANT
GUIDEWIRE INQWIRE 1.5J.035X260 (WIRE) IMPLANT
INQWIRE 1.5J .035X260CM (WIRE) ×2
KIT ENCORE 26 ADVANTAGE (KITS) ×3 IMPLANT
KIT HEART LEFT (KITS) ×2 IMPLANT
PACK CARDIAC CATHETERIZATION (CUSTOM PROCEDURE TRAY) ×2 IMPLANT
SHEATH PINNACLE 6F 10CM (SHEATH) ×1 IMPLANT
STENT PROMUS PREM MR 3.0X38 (Permanent Stent) ×1 IMPLANT
TRANSDUCER W/STOPCOCK (MISCELLANEOUS) ×2 IMPLANT
TUBING CIL FLEX 10 FLL-RA (TUBING) ×2 IMPLANT
WIRE ASAHI PROWATER 180CM (WIRE) ×1 IMPLANT
WIRE EMERALD 3MM-J .035X150CM (WIRE) ×1 IMPLANT

## 2017-03-31 NOTE — Care Management Note (Signed)
Case Management Note  Patient Details  Name: Luke Mcbride MRN: [id number] Date of Birth: [birth year]  Subjective/Objective:  From home with spouse,  pta indep, s/p coronary stent intervention, will be on plavix.  He has PCP and medication coverage.  For DC today, no needs.                  Action/Plan: DC home today, no needs..   Expected Discharge Date:  04/03/17               Expected Discharge Plan:  Home/Self Care  In-House Referral:     Discharge planning Services  CM Consult  Post Acute Care Choice:    Choice offered to:     DME Arranged:    DME Agency:     HH Arranged:    HH Agency:     Status of Service:  Completed, signed off  If discussed at [employer] of Stay Meetings, dates discussed:    Additional Comments:  Zenon Mayo, RN 03/31/2017, 1:57 PM

## 2017-03-31 NOTE — Progress Notes (Signed)
Progress Note  Patient Name: Luke Mcbride Date of Encounter: 03/31/2017  Primary Cardiologist: P. Nishan  Subjective   Successful DES mid LAD earlier this morning.  No difficulty overnight.  IV hydration was completed without dyspnea.  Inpatient Medications    Scheduled Meds: . [MAR Hold] aspirin EC  81 mg Oral Daily  . [MAR Hold] clopidogrel  75 mg Oral Daily  . [MAR Hold] finasteride  5 mg Oral QHS  . [MAR Hold] heparin  5,000 Units Subcutaneous Q8H  . [MAR Hold] isosorbide-hydrALAZINE  1 tablet Oral BID  . [MAR Hold] levothyroxine  100 mcg Oral QAC breakfast  . [MAR Hold] metoprolol succinate  25 mg Oral Daily  . [MAR Hold] pantoprazole  40 mg Oral Daily  . sodium chloride flush  3 mL Intravenous Q12H  . [MAR Hold] tamsulosin  0.4 mg Oral QHS   Continuous Infusions: . sodium chloride    . sodium chloride 50 mL/hr at 03/30/17 1604  . sodium chloride 1 mL/kg/hr (03/31/17 1014)   PRN Meds: sodium chloride, [MAR Hold] acetaminophen, morphine injection, [MAR Hold] nitroGLYCERIN, [MAR Hold] ondansetron (ZOFRAN) IV, oxyCODONE, sodium chloride flush   Vital Signs    Vitals:   03/31/17 1015 03/31/17 1020 03/31/17 1025 03/31/17 1030  BP: (!) 151/72 (!) 150/71 132/68 130/77  Pulse: 60 (!) 57 65 (!) 56  Resp: 12 16 20 18   Temp:      TempSrc:      SpO2: 99% 100% 100% 99%  Weight:      Height:        Intake/Output Summary (Last 24 hours) at 03/31/2017 1036 Last data filed at 03/31/2017 1000 Gross per 24 hour  Intake 576.67 ml  Output 1150 ml  Net -573.33 ml   Filed Weights   03/30/17 1053 03/31/17 0433  Weight: 226 lb 6.4 oz (102.7 kg) 227 lb 3.2 oz (103.1 kg)    Telemetry    Dermal sinus rhythm- Personally Reviewed  ECG    Normal sinus rhythm with small inferior Q waves and occasional PVCs.  No acute ST-T changes noted post PCI.- Personally Reviewed  Physical Exam  Comfortable and able to lie flat. GEN: No acute distress.   Neck: No JVD Cardiac:  RRR, no murmurs, rubs, or gallops.  Respiratory: Clear to auscultation bilaterally. GI: Soft, nontender, non-distended  MS: No edema; No deformity. Neuro:  Nonfocal  Psych: Normal affect   Labs    Chemistry Recent Labs  Lab 03/25/17 1026 03/30/17 1209 03/31/17 0534  NA 137 138 139  K 4.5 4.2 4.1  CL 103 110 111  CO2 21 22 22   GLUCOSE 92 98 108*  BUN 30* 24* 26*  CREATININE 1.75* 1.82* 1.78*  CALCIUM 8.3* 8.7* 8.0*  GFRNONAA 36* 34* 35*  GFRAA 42* 39* 40*  ANIONGAP  --  6 6     Hematology Recent Labs  Lab 03/30/17 1209 03/31/17 0534  WBC 4.4 4.0  RBC 3.37* 3.00*  HGB 10.5* 9.2*  HCT 33.2* 29.2*  MCV 98.5 97.3  MCH 31.2 30.7  MCHC 31.6 31.5  RDW 16.8* 16.9*  PLT 170 151    Cardiac EnzymesNo results for input(s): TROPONINI in the last 168 hours. No results for input(s): TROPIPOC in the last 168 hours.   BNPNo results for input(s): BNP, PROBNP in the last 168 hours.   DDimer No results for input(s): DDIMER in the last 168 hours.   Radiology    No results found.  Cardiac Studies  Coronary Diagrams   Diagnostic Diagram       Post-Intervention Diagram          Patient Profile     81 y.o. male with PMH of CABG x6 ('02), HTN, Hypothyroidism, CKD, DM, B-cell lymphoma, chronic combined HF who presented for pre-hydration for cath 03/30/2017 and successful LAD stent on 03/31/2017 with minimal contrast exposure.   Assessment & Plan    1.  Native LAD disease previously grafting with LIMA which is now atretic.  Successful drug-eluting stent to the LAD.  Plan discharge tomorrow if no ischemic complications or other complaints. 2.  Stage III/IV chronic kidney disease, overnight hydration, and minimal contrast during the procedure.  Likely discharge in a.m.  We will track kidney function.  For questions or updates, please contact [place] Please consult [email or web address] for contact info under Cardiology/STEMI.      Signed, Sinclair Grooms, MD    03/31/2017, 10:36 AM

## 2017-03-31 NOTE — Interval H&P Note (Signed)
History and Physical Interval Note:  03/31/2017 7:19 AM  Luke Mcbride  has presented today for surgery, with the diagnosis of cad  The various methods of treatment have been discussed with the patient and family. After consideration of risks, benefits and other options for treatment, the patient has consented to  Procedure(s): CORONARY STENT INTERVENTION (N/A) as a surgical intervention .  The patient's history has been reviewed, patient examined, no change in status, stable for surgery.  I have reviewed the patient's chart and labs.  Questions were answered to the patient's satisfaction.   Cath Lab Visit (complete for each Cath Lab visit)  Clinical Evaluation Leading to the Procedure:   ACS: No.  Non-ACS:    Anginal Classification: CCS III  Anti-ischemic medical therapy: Maximal Therapy (2 or more classes of medications)  Non-Invasive Test Results: High-risk stress test findings: cardiac mortality >3%/year  Prior CABG: Previous CABG        [facility] 03/31/2017 7:20 AM

## 2017-03-31 NOTE — Progress Notes (Addendum)
Site area: Right groin a 6 french arterial sheath was pulled  Site Prior to Removal:  Level 0  Pressure Applied For 30 MINUTES     Bedrest Beginning at 1100a-1500p  Manual:   Yes.    Patient Status During Pull:  stable  Post Pull Groin Site:  Level 0  Post Pull Instructions Given:  Yes.    Post Pull Pulses Present:  Yes.    Dressing Applied:  Yes.    Comments:  VS remain stable

## 2017-04-01 LAB — BASIC METABOLIC PANEL
Anion gap: 7 (ref 5–15)
BUN: 25 mg/dL — ABNORMAL HIGH (ref 6–20)
CHLORIDE: 112 mmol/L — AB (ref 101–111)
CO2: 20 mmol/L — AB (ref 22–32)
CREATININE: 1.86 mg/dL — AB (ref 0.61–1.24)
Calcium: 8.3 mg/dL — ABNORMAL LOW (ref 8.9–10.3)
GFR calc Af Amer: 38 mL/min — ABNORMAL LOW (ref >60)
GFR calc non Af Amer: 33 mL/min — ABNORMAL LOW (ref >60)
Glucose, Bld: 121 mg/dL — ABNORMAL HIGH (ref 65–99)
Potassium: 4.2 mmol/L (ref 3.5–5.1)
SODIUM: 139 mmol/L (ref 135–145)

## 2017-04-01 LAB — CBC
HCT: 30.2 % — ABNORMAL LOW (ref 39.0–52.0)
Hemoglobin: 9.5 g/dL — ABNORMAL LOW (ref 13.0–17.0)
MCH: 30.4 pg (ref 26.0–34.0)
MCHC: 31.5 g/dL (ref 30.0–36.0)
MCV: 96.8 fL (ref 78.0–100.0)
PLATELETS: 141 10*3/uL — AB (ref 150–400)
RBC: 3.12 MIL/uL — ABNORMAL LOW (ref 4.22–5.81)
RDW: 16.8 % — AB (ref 11.5–15.5)
WBC: 4.5 10*3/uL (ref 4.0–10.5)

## 2017-04-01 MED ORDER — FUROSEMIDE 20 MG PO TABS: 20.0000 mg | ORAL_TABLET | Freq: Every day | ORAL | 6 refills | Status: DC

## 2017-04-01 MED ORDER — TELMISARTAN 20 MG PO TABS: 20.0000 mg | ORAL_TABLET | Freq: Every day | ORAL | 6 refills | Status: DC

## 2017-04-01 MED FILL — Heparin Sodium (Porcine) Inj 1000 Unit/ML: INTRAMUSCULAR | Qty: 10 | Status: AC

## 2017-04-01 MED FILL — Nitroglycerin IV Soln 100 MCG/ML in D5W: INTRA_ARTERIAL | Qty: 10 | Status: AC

## 2017-04-01 NOTE — Progress Notes (Signed)
Progress Note  Patient Name: Luke Mcbride Date of Encounter: 04/01/2017  Primary Cardiologist Dr. [name]   Subjective   Feeling well. No chest pain, sob or palpitations.   Inpatient Medications    Scheduled Meds: . aspirin EC  81 mg Oral Daily  . clopidogrel  75 mg Oral Daily  . finasteride  5 mg Oral QHS  . heparin  5,000 Units Subcutaneous Q8H  . isosorbide-hydrALAZINE  1 tablet Oral BID  . levothyroxine  100 mcg Oral QAC breakfast  . metoprolol succinate  25 mg Oral Daily  . pantoprazole  40 mg Oral Daily  . rosuvastatin  10 mg Oral [id number]  . sodium chloride flush  3 mL Intravenous Q12H  . tamsulosin  0.4 mg Oral QHS   Continuous Infusions: . sodium chloride     PRN Meds: sodium chloride, acetaminophen, morphine injection, nitroGLYCERIN, ondansetron (ZOFRAN) IV, oxyCODONE, sodium chloride flush   Vital Signs    Vitals:   03/31/17 1947 03/31/17 2000 04/01/17 0359 04/01/17 0719  BP: 132/68  126/65 135/73  Pulse: 72  75 99  Resp: 12 14 17    Temp: 98.4 F (36.9 C)  98 F (36.7 C) 98.2 F (36.8 C)  TempSrc: Oral  Oral Oral  SpO2: 97%  93%   Weight:   231 lb 7.7 oz (105 kg)   Height:        Intake/Output Summary (Last 24 hours) at 04/01/2017 0734 Last data filed at 04/01/2017 4098 Gross per 24 hour  Intake 1031 ml  Output 1600 ml  Net -569 ml   Filed Weights   03/30/17 1053 03/31/17 0433 04/01/17 0359  Weight: 226 lb 6.4 oz (102.7 kg) 227 lb 3.2 oz (103.1 kg) 231 lb 7.7 oz (105 kg)    Telemetry    SR with rare PVCs - Personally Reviewed  ECG    NSR - Personally Reviewed  Physical Exam   GEN: No acute distress.   Neck: No JVD Cardiac: RRR, no murmurs, rubs, or gallops. No hematoma or bruise at R groin cath site.  Respiratory: faint bibasilar rales GI: Soft, nontender, non-distended  MS: No edema; No deformity. Neuro:  Nonfocal  Psych: Normal affect   Labs    Chemistry Recent Labs  Lab 03/30/17 1209 03/31/17 0534 04/01/17 0313    NA 138 139 139  K 4.2 4.1 4.2  CL 110 111 112*  CO2 22 22 20*  GLUCOSE 98 108* 121*  BUN 24* 26* 25*  CREATININE 1.82* 1.78* 1.86*  CALCIUM 8.7* 8.0* 8.3*  GFRNONAA 34* 35* 33*  GFRAA 39* 40* 38*  ANIONGAP 6 6 7      Hematology Recent Labs  Lab 03/30/17 1209 03/31/17 0534 04/01/17 0313  WBC 4.4 4.0 4.5  RBC 3.37* 3.00* 3.12*  HGB 10.5* 9.2* 9.5*  HCT 33.2* 29.2* 30.2*  MCV 98.5 97.3 96.8  MCH 31.2 30.7 30.4  MCHC 31.6 31.5 31.5  RDW 16.8* 16.9* 16.8*  PLT 170 151 141*    Radiology    No results found.  Cardiac Studies   CORONARY STENT INTERVENTION 03/31/17  Conclusion     Prox LAD to Mid LAD lesion is 90% stenosed.  Ost 1st Diag lesion is 100% stenosed.  Post intervention, there is a 0% residual stenosis.  A stent was successfully placed.   1.Successful stenting of the mid LAD with DES 2. Normal LVEDP of 11 mm Hg  Plan: overnight hydration. DAPT for at least one year. Anticipate DC in am  if stable.     Diagnostic Diagram       Post-Intervention Diagram          Patient Profile     81 y.o. male 81 yo male with PMH of CABG x6 ('02), HTN, Hypothyroidism, CKD, DM, B-cell lymphoma, chronic combined HF who admitted 11/14 for pre-hydration for cath with PCI.   Seen in the office on 2/18 for pre op clearance and underwent stress test that showed scar but no ischemia. Underwent colon resection and chemotherapy. He was seen in the office on 11/2 by [name] and reported ongoing DOE. Appears to have had medications adjustments with no improvement in symptoms. Last echo showed reduced EF of 35%. Given his progression in symptoms and newly reduced EF decision was made to proceed with outpatient cath.   Presented on 11/7 and underwent cath with Dr. Martinique showing severe 3 vessel disease with patent SVG to PDA/PLOM, and occluded SVG--Diag, and SVG--OM with atretic LIMA to the LAD. Given his hx of renal dysfunction it was felt he should come back for  staged intervention to the mLAD. He was instructed to hold his lasix post cath.   Assessment & Plan    1. CAD - s/p Successful stenting of the mid LAD with DES. Atretic LIMA to the LAD. Continue ASA, Plavix, statin, BIdil and BB.   2. HLD - No results found for requested labs within last 8760 hours.  - Consider LFT check as outpatient. Continue Crestor 10mg  qd for now.   3. Chronic combined CHF - Continue BB and Bidil. Faint rales and LE edema. His micardis 20mg  qd and lasix 20mg  qd on hold, resumption per MD.   4. CKD stage III - SCr stable at baseline of 1.7-1.8. As above. Follows by Dr. Marval Regal     For questions or updates, please contact [facility] Please consult [email or web address] for contact info under Cardiology/STEMI.      Jarrett Soho, PA  04/01/2017, 7:34 AM

## 2017-04-01 NOTE — Discharge Summary (Signed)
The patient has been seen in conjunction with Vin Bhagat, PAC. All aspects of care have been considered and discussed. The patient has been personally interviewed, examined, and all clinical data has been reviewed.   Stable post PCI.  Kidney function is stable.  Access site reveals no evidence of hematoma or clinical problem.  She did resume ARB and diuretic therapy tomorrow morning.  Basic metabolic panel will be checked on follow-up.  The patient is stable and discharge is pending today.   Discharge Summary    Patient ID: Luke Mcbride,  MRN: [id number], DOB/AGE: 11-27-1935 81 y.o.  Admit date: 03/30/2017 Discharge date: 04/01/2017  Primary Care Provider: Donato Heinz Primary Cardiologist: Dr. [name]   Discharge Diagnoses    Active Problems:   CAD (coronary artery disease)   Dyspnea on effort   HLD  CKD stage III  Chronic combined CHF  HTN  Allergies No Known Allergies  Diagnostic Studies/Procedures    CORONARY STENT INTERVENTION 03/31/17  Conclusion     Prox LAD to Mid LAD lesion is 90% stenosed.  Ost 1st Diag lesion is 100% stenosed.  Post intervention, there is a 0% residual stenosis.  A stent was successfully placed.  1.Successful stenting of the mid LAD with DES 2. Normal LVEDP of 11 mm Hg  Plan: overnight hydration. DAPT for at least one year. Anticipate DC in am if stable.     Diagnostic Diagram       Post-Intervention Diagram           History of Present Illness     81 y.o. male 81 yo male with PMH of CABG x6 ('02), HTN, Hypothyroidism, CKD, DM, B-cell lymphoma, chronic combined HF who admitted 11/14 for pre-hydration for cath with PCI.   Seen in the office on 2/18 for pre op clearance and underwent stress test that showed scar but no ischemia. Underwent colon resection and chemotherapy. He was seen in the office on 11/2 by [name] and reported ongoing DOE. Appears to have had medications adjustments with no  improvement in symptoms. Last echo showed reduced EF of 35%. Given his progression in symptoms and newly reduced EF decision was made to proceed with outpatient cath.   Presented on 11/7 and underwent cath with Dr. Martinique showing severe 3 vessel disease with patent SVG to PDA/PLOM, and occluded SVG--Diag, and SVG--OM with atretic LIMA to the LAD. Given his hx of renal dysfunction it was felt he should come back for staged intervention to the mLAD. He was instructed to hold his lasix post cath.    Hospital Course     Consultants: None  1. CAD - s/p Successful stenting of the mid LAD with DES. Atretic LIMA to the LAD. Continue ASA, Plavix, statin, BIdil and BB. Ambulated well. CRP II as outpatient.   2. HLD - No results found for requested labs within last 8760 hours.  - Consider LFT check as outpatient. Continue Crestor 10mg  qd for now.   3. Chronic combined CHF - Continue BB and Bidil. Faint rales and LE edema. His micardis 20mg  qd and lasix 20mg  qd on hold, resumption at discharge.   4. CKD stage III - SCr stable at baseline of 1.7-1.8. As above. Follows by Dr. Marval Regal   5. HTN - minimally elevated at times.  The patient has been seen by Dr. Tamala Julian today and deemed ready for discharge home. All follow-up appointments have been scheduled. Discharge medications are listed below.   Discharge Vitals Blood  pressure 135/73, pulse 99, temperature 98.2 F (36.8 C), temperature source Oral, resp. rate 18, height 6\' 1"  (1.854 m), weight 231 lb 7.7 oz (105 kg), SpO2 93 %.  Filed Weights   03/30/17 1053 03/31/17 0433 04/01/17 0359  Weight: 226 lb 6.4 oz (102.7 kg) 227 lb 3.2 oz (103.1 kg) 231 lb 7.7 oz (105 kg)    Labs & Radiologic Studies     CBC Recent Labs    03/30/17 1209 03/31/17 0534 04/01/17 0313  WBC 4.4 4.0 4.5  NEUTROABS 3.1  --   --   HGB 10.5* 9.2* 9.5*  HCT 33.2* 29.2* 30.2*  MCV 98.5 97.3 96.8  PLT 170 151 765*   Basic Metabolic Panel Recent Labs     03/31/17 0534 04/01/17 0313  NA 139 139  K 4.1 4.2  CL 111 112*  CO2 22 20*  GLUCOSE 108* 121*  BUN 26* 25*  CREATININE 1.78* 1.86*  CALCIUM 8.0* 8.3*    No results found.  Disposition   Pt is being discharged home today in good condition.  Follow-up Plans & Appointments    Follow-up Information    [name], MD. Go on 04/19/2017.   Specialty:  Cardiology Why:  @10am  for post hosptial  Contact information: [address] [place] [address] [phone]          Discharge Instructions    AMB Referral to Cardiac Rehabilitation - Phase II   Complete by:  As directed    Diagnosis:  Coronary Stents   Amb Referral to Cardiac Rehabilitation   Complete by:  As directed    Diagnosis:  Coronary Stents   Diet - low sodium heart healthy   Complete by:  As directed    Discharge instructions   Complete by:  As directed    No driving for 48 hours. No lifting over 5 lbs for 1 week. No sexual activity for 1 week.  Keep procedure site clean & dry. If you notice increased pain, swelling, bleeding or pus, call/return!  You may shower, but no soaking baths/hot tubs/pools for 1 week.   Increase activity slowly   Complete by:  As directed       Discharge Medications   Current Discharge Medication List    START taking these medications   Details  furosemide (LASIX) 20 MG tablet Take 1 tablet (20 mg total) daily by mouth. Qty: 30 tablet, Refills: 6    telmisartan (MICARDIS) 20 MG tablet Take 1 tablet (20 mg total) daily by mouth. Qty: 30 tablet, Refills: 6      CONTINUE these medications which have NOT CHANGED   Details  acetaminophen (TYLENOL) 500 MG tablet Take 500-1,000 mg by mouth daily as needed for moderate pain or headache.    aspirin EC 81 MG EC tablet Take 1 tablet (81 mg total) by mouth daily. Qty: 30 tablet    clopidogrel (PLAVIX) 75 MG tablet Take 1 tablet (75 mg total) by mouth daily. Qty: 90 tablet, Refills: 1      dorzolamide-timolol (COSOPT) 22.3-6.8 MG/ML ophthalmic solution Place 1 drop into both eyes 2 (two) times daily.     finasteride (PROSCAR) 5 MG tablet Take 5 mg by mouth at bedtime.     levothyroxine (SYNTHROID, LEVOTHROID) 100 MCG tablet Take 100 mcg by mouth daily before breakfast.     metoprolol succinate (TOPROL XL) 25 MG 24 hr tablet Take 1 tablet (25 mg total) by mouth daily. Qty: 90 tablet, Refills: 3  Multiple Vitamin (MULTIVITAMIN) tablet Take 1 tablet by mouth daily.      pantoprazole (PROTONIX) 40 MG tablet TAKE 1 TABLET BY MOUTH  DAILY Qty: 90 tablet, Refills: 1    rosuvastatin (CRESTOR) 10 MG tablet Take 10 mg every morning by mouth.    Tamsulosin HCl (FLOMAX) 0.4 MG CAPS Take 0.4 mg by mouth at bedtime.     isosorbide-hydrALAZINE (BIDIL) 20-37.5 MG tablet Take 1 tablet by mouth 2 (two) times daily. Qty: 60 tablet, Refills: 3    nitroGLYCERIN (NITROSTAT) 0.4 MG SL tablet Place 0.4 mg under the tongue every 5 (five) minutes as needed for chest pain.          Outstanding Labs/Studies   BMET during follow up  Duration of Discharge Encounter   Greater than 30 minutes including physician time.  Signed, Crista Luria Bhagat PA-C 04/01/2017, 9:25 AM

## 2017-04-01 NOTE — Progress Notes (Signed)
CARDIAC REHAB PHASE I   PRE:  Rate/Rhythm: 97 SR  BP:  Supine:   Sitting: 120/71  Standing:    SaO2: 95%RA  MODE:  Ambulation: 500 ft   POST:  Rate/Rhythm: 108 ST  BP:  Supine:   Sitting: 130/72  Standing:    SaO2: 98%RA 0810-0855 Pt walked 500 ft independently but had to sit down to rest due to SOB and legs weak once . Denied CP. Pt stated he has been limited by SOB. Had abdominal surgery for cancer in February and received chemo so he has not fully gotten back to much ex. Reviewed NTG use, heart healthy diet choices, modified ex, CRP 2 and plavix use. He has been on plavix since CABG. Will refer to [place] CRP 2. Pt has not attended before with previous cardiac events.   Graylon Good, RN BSN  04/01/2017 8:51 AM

## 2017-04-04 ENCOUNTER — Telehealth (HOSPITAL_COMMUNITY): Payer: Self-pay

## 2017-04-04 NOTE — Telephone Encounter (Signed)
Patients insurance is active and benefits verified through Medicare Part A & B - No co-pay, deductible amount of $183.00/$183.00 has been met, 20% co-insurance, and no pre-authorization is required. Passport/reference [phone]  Patients insurance is active and benefits verified through [facility] - No co-pay, no deductible, no out of pocket, no co-insurance, and no pre-authorization is required. Passport/reference [phone]

## 2017-04-05 ENCOUNTER — Telehealth (HOSPITAL_COMMUNITY): Payer: Self-pay

## 2017-04-05 NOTE — Telephone Encounter (Signed)
Attempted to call patient in regards to Cardiac Rehab - Lm on Vm °

## 2017-04-08 ENCOUNTER — Other Ambulatory Visit: Payer: Self-pay | Admitting: Cardiovascular Disease

## 2017-04-12 ENCOUNTER — Telehealth (HOSPITAL_COMMUNITY): Payer: Self-pay

## 2017-04-12 NOTE — Telephone Encounter (Signed)
Called and spoke with patient in regards to Cardiac Rehab - Patient is not interested in the program. Closed referral.

## 2017-04-14 ENCOUNTER — Ambulatory Visit: Payer: Medicare Other | Admitting: Cardiology

## 2017-04-17 NOTE — Progress Notes (Signed)
Cardiology Office Note   Date:  04/19/2017   ID:  Luke Mcbride, DOB Aug 08, 1935, MRN [id number]  PCP:  Donato Heinz, MD  Cardiologist:  Dr. [name]    No chief complaint on file.     History of Present Illness: Luke Mcbride is a 81 y.o. male who presents for SOB.    He had CABG X 6 in 2002. He was admitted 07/2012 with NSTEMI and underwent BMS to the SVR-PDA/PL. His LIMA to LAD was atretic but disease in the LAD was moderate. SVG-OM and SVG to diagonal were known to be occluded. Other PMH includes DM, HTN, HLD and CKD.   February, he was diagnosed with Stage IV Diffuse large B-cell lymphoma involving the small intestine. He required surgery. Prior to surgery, he had a nuclear stress test on 07/01/16 which showed scar but no ischemia. He was cleared to undergo surgery. He had 6 inchesof his colon resected. Following surgery he had 6 rounds of chemo. He recently had a f/u PET scan and was told he was in remission.   Recently seen for DOE, echo revealed EF 35-40% down from 55%. Seen by PA 03/18/17 and cath set up done by Dr Martinique 03/23/17 Reviewed films Had staged stent to the mid native LAD Lasix 20 mg And micardis 20 mg resumed on d/c   1. Severe 3 vessel obstructive CAD. 2. Patent SVG to PDA/PLOM. Stent in proximal SVG is patent 3. Occluded SVG to the first diagonal. The first diagonal is small in caliber 4. Occluded SVG to OM 5. Atretic LIMA to the LAD 6. Normal LVEDP 7. Normal right heart pressures.   No dyspnea Activity limited by back pain and neuropathy in legs ? From chemo.   Past Medical History:  Diagnosis Date  . ALLERGIC RHINITIS   . Anemia    hx  . Barrett's esophagus   . BOOP (bronchiolitis obliterans with organizing pneumonia) ([facility])    a. s/p R VATS 2008.  [name] CAD (coronary artery disease)    a. 05/2000: NSTEMI/CABG x 6: LIMA->LAD, VG->D1, VG->OM1->2, VG->PDA->RPL;  b. 07/2007 MV: high lat infarct, no ischemia, EF 47%;  c. Cath/PCI: LM nl, LAD20p, 37m,  D1 nl, D2 60-70ost, D3 nl, LCX 70ost, 130m, OM1/OM2 min irregs, RCA 30 diff, PDA 99, LIMA->LAD atretic, VG->D1 100, VG->OM1->2 100, VG->PDA->RPL 90p (4.0x23 Vision BMS);  c. 07/2012 Echo: EF 55%, gr1 DD.  [name] Cataract   . CKD (chronic kidney disease), stage III ([facility])    a. renal bx 2008: GLN with vasculitis  . Diffuse large B cell lymphoma ([facility]) 08/07/2016  . GERD (gastroesophageal reflux disease)   . Glaucoma    a. Cannot see out of L eye.  . Hematuria    Microscopic  . Hyperglycemia    Patient reported while on prednisone, had to take insulin  . Hyperlipemia   . Hypertension   . Hypothyroidism   . ILD (interstitial lung disease) ([facility])   . Iritis   . Local infection of skin and subcutaneous tissue   . Membranoproliferative nephritis   . Myocardial infarction ([facility]) 2002  . Neutropenia, drug-induced ([facility])   . Recurrent boils   . Residual foreign body in soft tissue   . Vasculitis ([facility])    a.  pauciimmune vasculitis with renal involvement and hx of transient hemoptysis in the past with associated BOOP, 2008 (renal bx 2008: GLN with vasculitis). b. History of treatment with 2 cycles of Cytoxan and pheresis. H/o hemoptysis and pulm hemorrhage  with 2nd cycle of cytoxan.    Past Surgical History:  Procedure Laterality Date  . BOWEL RESECTION N/A 07/12/2016   Procedure: SMALL BOWEL RESECTION;  Surgeon: Rolm Bookbinder, MD;  Location: [facility];  Service: General;  Laterality: N/A;  . CATARACT EXTRACTION Bilateral   . CORONARY ARTERY BYPASS GRAFT  2002  . CORONARY STENT INTERVENTION N/A 03/31/2017   Procedure: CORONARY STENT INTERVENTION;  Surgeon: Martinique, Nymir Ringler M, MD;  Location: [facility] CV LAB;  Service: Cardiovascular;  Laterality: N/A;  . IR FLUORO GUIDE PORT INSERTION RIGHT  08/20/2016  . IR REMOVAL TUN ACCESS W/ PORT W/O FL MOD SED  01/20/2017  . IR US GUIDE VASC ACCESS RIGHT  08/20/2016  . LAPAROSCOPY N/A 07/12/2016   Procedure: LAPAROSCOPY DIAGNOSTIC;  Surgeon: Rolm Bookbinder, MD;   Location: [facility];  Service: General;  Laterality: N/A;  . LEFT HEART CATHETERIZATION WITH CORONARY/GRAFT ANGIOGRAM N/A 07/26/2012   Procedure: LEFT HEART CATHETERIZATION WITH Beatrix Fetters;  Surgeon: Wellington Hampshire, MD;  Location: [facility] CATH LAB;  Service: Cardiovascular;  Laterality: N/A;  . LUNG BIOPSY    . PERCUTANEOUS CORONARY STENT INTERVENTION (PCI-S)  07/26/2012   Procedure: PERCUTANEOUS CORONARY STENT INTERVENTION (PCI-S);  Surgeon: Wellington Hampshire, MD;  [facility] CATH LAB;  Service: Cardiovascular;;  . RENAL BIOPSY    . RIGHT/LEFT HEART CATH AND CORONARY/GRAFT ANGIOGRAPHY N/A 03/23/2017   Procedure: RIGHT/LEFT HEART CATH AND CORONARY/GRAFT ANGIOGRAPHY;  Surgeon: Martinique, Myanna Ziesmer M, MD;  Location: [facility] CV LAB;  Service: Cardiovascular;  Laterality: N/A;  . TOE AMPUTATION  2009   hammer toe     Current Outpatient Medications  Medication Sig Dispense Refill  . acetaminophen (TYLENOL) 500 MG tablet Take 500-1,000 mg by mouth daily as needed for moderate pain or headache.    [name] aspirin EC 81 MG EC tablet Take 1 tablet (81 mg total) by mouth daily. 30 tablet   . clopidogrel (PLAVIX) 75 MG tablet Take 1 tablet (75 mg total) by mouth daily. 90 tablet 1  . dorzolamide-timolol (COSOPT) 22.3-6.8 MG/ML ophthalmic solution Place 1 drop into both eyes 2 (two) times daily.     . finasteride (PROSCAR) 5 MG tablet Take 5 mg by mouth at bedtime.     . furosemide (LASIX) 20 MG tablet Take 1 tablet (20 mg total) daily by mouth. 30 tablet 6  . isosorbide-hydrALAZINE (BIDIL) 20-37.5 MG tablet Take 1 tablet by mouth 2 (two) times daily. 60 tablet 3  . levothyroxine (SYNTHROID, LEVOTHROID) 100 MCG tablet Take 100 mcg by mouth daily before breakfast.     . metoprolol succinate (TOPROL XL) 25 MG 24 hr tablet Take 1 tablet (25 mg total) by mouth daily. 90 tablet 3  . Multiple Vitamin (MULTIVITAMIN) tablet Take 1 tablet by mouth daily.      . nitroGLYCERIN (NITROSTAT) 0.4 MG SL tablet Place 1  tablet (0.4 mg total) under the tongue every 5 (five) minutes as needed for chest pain. 25 tablet 3  . pantoprazole (PROTONIX) 40 MG tablet TAKE 1 TABLET BY MOUTH  DAILY 90 tablet 3  . rosuvastatin (CRESTOR) 10 MG tablet Take 10 mg every morning by mouth.    . Tamsulosin HCl (FLOMAX) 0.4 MG CAPS Take 0.4 mg by mouth at bedtime.     [name] telmisartan (MICARDIS) 20 MG tablet Take 1 tablet (20 mg total) daily by mouth. 30 tablet 6   No current facility-administered medications for this visit.    Facility-Administered Medications Ordered in Other Visits  Medication  Dose Route Frequency Provider Last Rate Last Dose  . sodium chloride flush (NS) 0.9 % injection 10 mL  10 mL Intracatheter PRN Brunetta Genera, MD   10 mL at 08/24/16 1653  . sodium chloride flush (NS) 0.9 % injection 10 mL  10 mL Intracatheter PRN Brunetta Genera, MD   10 mL at 10/07/16 1555    Allergies:   Patient has no known allergies.    Social History:  The patient  reports that  has never smoked. he has never used smokeless tobacco. He reports that he does not drink alcohol or use drugs.   Family History:  The patient's family history includes Depression in his other; Diabetes in his mother and other; Heart disease in his mother; Prostate cancer in his father and other.    ROS:  General:no colds or fevers, no weight changes Skin:no rashes or ulcers HEENT:no blurred vision, no congestion CV:see HPI PUL:see HPI GI:no diarrhea constipation or melena, no indigestion GU:no hematuria, no dysuria MS:no joint pain, no claudication Neuro:no syncope, no lightheadedness Endo:no diabetes, + thyroid disease  Wt Readings from Last 3 Encounters:  04/19/17 228 lb 4 oz (103.5 kg)  04/01/17 231 lb 7.7 oz (105 kg)  03/23/17 220 lb (99.8 kg)     PHYSICAL EXAM: VS:  BP 114/70   Pulse (!) 106   Ht 6\' 1"  (1.854 m)   Wt 228 lb 4 oz (103.5 kg)   SpO2 95%   BMI 30.11 kg/m  , BMI Body mass index is 30.11 kg/m. Affect  appropriate Healthy:  appears stated age 81: normal Neck supple with no adenopathy JVP normal no bruits no thyromegaly Lungs clear with no wheezing and good diaphragmatic motion Heart:  S1/S2 no murmur, no rub, gallop or click PMI normal Abdomen: benighn, BS positve, no tenderness, no AAA no bruit.  No HSM or HJR Distal pulses intact with no bruits No edema Neuro non-focal Skin warm and dry No muscular weakness   EKG: 04/01/17 SR nonspecific ST/T wave changes    Recent Labs: 09/01/2016: B Natriuretic Peptide 288.4 01/21/2017: NT-Pro BNP 880 03/07/2017: ALT 16 04/01/2017: BUN 25; Creatinine, Ser 1.86; Hemoglobin 9.5; Platelets 141; Potassium 4.2; Sodium 139    Lipid Panel    Component Value Date/Time   CHOL 157 02/11/2014 0733   TRIG 150.0 (H) 02/11/2014 0733   HDL 36.30 (L) 02/11/2014 0733   CHOLHDL 4 02/11/2014 0733   VLDL 30.0 02/11/2014 0733   LDLCALC 91 02/11/2014 0733   LDLDIRECT 221.7 05/23/2007 0000       Other studies Reviewed: Additional studies/ records that were reviewed today include:  Echo 01/18/17 Study Conclusions  - Left ventricle: The cavity size was normal. There was mild focal   basal hypertrophy of the septum. Systolic function was moderately   reduced. The estimated ejection fraction was in the range of 35%   to 40%. There is akinesis of the inferolateral myocardium.   Doppler parameters are consistent with abnormal left ventricular   relaxation (grade 1 diastolic dysfunction). - Mitral valve: Calcified annulus. There was mild regurgitation. - Left atrium: The atrium was mildly dilated. - Atrial septum: There was an atrial septal aneurysm.  Impressions:  - Limited study with definity; full doppler not performed; akinesis   of the inferolateral wall with overall moderately reduced LV   systolic function; mild diastolic dysfunction; mild MR; mild LAE.  ECHO 12/2016  Study Conclusions  - Left ventricle: The cavity size was mildly  dilated. There  was   mild focal basal hypertrophy of the septum. Systolic function was   mildly to moderately reduced. The estimated ejection fraction was   in the range of 40% to 45%. Mild diffuse hypokinesis. There was   fusion of early and atrial contributions to ventricular filling.   The study is not technically sufficient to allow evaluation of LV   diastolic function. - Left atrium: The atrium was mildly dilated. - Right ventricle: The cavity size was mildly dilated. Wall   thickness was normal. - Recommendations: Recommend limited repeat study with definity   contrast to assess wall motion and more accurate assessment of   EF.  Recommendations:  Recommend limited repeat study with definity contrast to assess wall motion and more accurate assessment of EF.   NUC study  06/30/16 study Highlights     The left ventricular ejection fraction is moderately decreased (30-44%).  Nuclear stress EF: 40%.  There was no ST segment deviation noted during stress.  No T wave inversion was noted during stress.  Defect 1: There is a large defect of severe severity present in the basal inferolateral, basal anterolateral, mid inferolateral and mid anterolateral location.  Findings consistent with prior myocardial infarction.  This is an intermediate risk study.   Intermediate risk stress nuclear study with a large scar in the left circumflex coronary artery distribution moderate reduction in left ventricular systolic function.     ASSESSMENT AND PLAN:    1.  CRF with Cr at 1.86 stable f/u renal    2.  Hypotension resolved back on CHF meds improved   3.   CAD s/p CABG   See recent cath above DES to native LAD 04/03/17 DAT   4.  Interstitial lung disease, stable f/u pulmonary   5.  Diffuse Large B cell lymphoma treated and in remission. Has appt with oncology in January   6.  HLD stable.  Lab Results  Component Value Date   LDLCALC 91 02/11/2014    F/U with me 6 months     Current medicines are reviewed with the patient today.  The patient Has no concerns regarding medicines.  Jenkins Rouge

## 2017-04-19 ENCOUNTER — Ambulatory Visit (INDEPENDENT_AMBULATORY_CARE_PROVIDER_SITE_OTHER): Payer: Medicare Other | Admitting: Cardiovascular Disease

## 2017-04-19 ENCOUNTER — Encounter: Payer: Self-pay | Admitting: Cardiovascular Disease

## 2017-04-19 VITALS — BP 114/70 | HR 106 | Ht 73.0 in | Wt 228.2 lb

## 2017-04-19 DIAGNOSIS — X32XXXD Exposure to sunlight, subsequent encounter: Secondary | ICD-10-CM | POA: Diagnosis not present

## 2017-04-19 DIAGNOSIS — L570 Actinic keratosis: Secondary | ICD-10-CM | POA: Diagnosis not present

## 2017-04-19 DIAGNOSIS — I2583 Coronary atherosclerosis due to lipid rich plaque: Secondary | ICD-10-CM | POA: Diagnosis not present

## 2017-04-19 DIAGNOSIS — C44622 Squamous cell carcinoma of skin of right upper limb, including shoulder: Secondary | ICD-10-CM | POA: Diagnosis not present

## 2017-04-19 DIAGNOSIS — I2510 Atherosclerotic heart disease of native coronary artery without angina pectoris: Secondary | ICD-10-CM | POA: Diagnosis not present

## 2017-04-19 DIAGNOSIS — B078 Other viral warts: Secondary | ICD-10-CM | POA: Diagnosis not present

## 2017-04-19 DIAGNOSIS — Z08 Encounter for follow-up examination after completed treatment for malignant neoplasm: Secondary | ICD-10-CM | POA: Diagnosis not present

## 2017-04-19 DIAGNOSIS — Z85828 Personal history of other malignant neoplasm of skin: Secondary | ICD-10-CM | POA: Diagnosis not present

## 2017-04-19 DIAGNOSIS — D0471 Carcinoma in situ of skin of right lower limb, including hip: Secondary | ICD-10-CM | POA: Diagnosis not present

## 2017-04-19 MED ORDER — NITROGLYCERIN 0.4 MG SL SUBL: 0.4000 mg | SUBLINGUAL_TABLET | SUBLINGUAL | 3 refills | Status: DC | PRN

## 2017-04-19 NOTE — Patient Instructions (Signed)

## 2017-05-27 DIAGNOSIS — L570 Actinic keratosis: Secondary | ICD-10-CM | POA: Diagnosis not present

## 2017-05-27 DIAGNOSIS — Z85828 Personal history of other malignant neoplasm of skin: Secondary | ICD-10-CM | POA: Diagnosis not present

## 2017-05-27 DIAGNOSIS — Z08 Encounter for follow-up examination after completed treatment for malignant neoplasm: Secondary | ICD-10-CM | POA: Diagnosis not present

## 2017-05-27 DIAGNOSIS — X32XXXD Exposure to sunlight, subsequent encounter: Secondary | ICD-10-CM | POA: Diagnosis not present

## 2017-06-01 DIAGNOSIS — C8330 Diffuse large B-cell lymphoma, unspecified site: Secondary | ICD-10-CM | POA: Diagnosis not present

## 2017-06-01 DIAGNOSIS — R109 Unspecified abdominal pain: Secondary | ICD-10-CM | POA: Diagnosis not present

## 2017-06-01 DIAGNOSIS — J42 Unspecified chronic bronchitis: Secondary | ICD-10-CM | POA: Diagnosis not present

## 2017-06-01 DIAGNOSIS — E669 Obesity, unspecified: Secondary | ICD-10-CM | POA: Diagnosis not present

## 2017-06-01 DIAGNOSIS — I2510 Atherosclerotic heart disease of native coronary artery without angina pectoris: Secondary | ICD-10-CM | POA: Diagnosis not present

## 2017-06-01 DIAGNOSIS — G629 Polyneuropathy, unspecified: Secondary | ICD-10-CM | POA: Diagnosis not present

## 2017-06-01 DIAGNOSIS — N400 Enlarged prostate without lower urinary tract symptoms: Secondary | ICD-10-CM | POA: Diagnosis not present

## 2017-06-01 DIAGNOSIS — I129 Hypertensive chronic kidney disease with stage 1 through stage 4 chronic kidney disease, or unspecified chronic kidney disease: Secondary | ICD-10-CM | POA: Diagnosis not present

## 2017-06-01 DIAGNOSIS — N057 Unspecified nephritic syndrome with diffuse crescentic glomerulonephritis: Secondary | ICD-10-CM | POA: Diagnosis not present

## 2017-06-01 DIAGNOSIS — N183 Chronic kidney disease, stage 3 (moderate): Secondary | ICD-10-CM | POA: Diagnosis not present

## 2017-06-01 DIAGNOSIS — E7800 Pure hypercholesterolemia, unspecified: Secondary | ICD-10-CM | POA: Diagnosis not present

## 2017-06-01 DIAGNOSIS — N058 Unspecified nephritic syndrome with other morphologic changes: Secondary | ICD-10-CM | POA: Diagnosis not present

## 2017-06-03 NOTE — Progress Notes (Signed)
HEMATOLOGY/ONCOLOGY CLINIC NOTE  Date of Service: 06/06/17  Patient Care Team: Donato Heinz, MD as PCP - General (Nephrology) Brunetta Genera, MD as Consulting Physician (Hematology) [name], MD as Consulting Physician (Cardiology)  CHIEF COMPLAINTS/PURPOSE OF CONSULTATION:  Diffuse large B cell lymphoma of the small intestine  HISTORY OF PRESENTING ILLNESS:   Luke Mcbride is a wonderful 82 y.o. male who has been referred to [place] by Dr .Donato Heinz, MD and Mauricio Po MD for evaluation and management of newly diagnosed diffuse large B-cell lymphoma of the small bowel.  Patient has a history of multiple medical comorbidities as noted below including coronary artery disease status post PCI and CABG, chronic kidney disease stage III, Barrett's esophagus, hypertension, dyslipidemia, interstitial lung disease, GERD, vasculitis involving the lungs and kidneys on chronic Imuran therapy.  He was having upper abdominal discomfort for a few months with loss of about 15-20 pounds. He had a CT scan of the abdomen done by Dr. Havery Moros on 06/15/2016 which showed a 3.3 x 3.2 x 2.4 cm indeterminate lesion involving the left upper quadrant small bowel loops. No additional small bowel lesions noted. No ascites. No retroperitoneal mesenteric or pelvic lymphadenopathy noted.  Patient was seen by Dr. Serita Grammes and underwent a diagnostic laparoscopy with small bowel resection on 07/12/2016. Pathology showed that the lesion was consistent with diffuse large B-cell lymphoma 2 cm in length and 4 cm in width centrally ulcerated mass involving 100% of the bowel circumference. The tumor was about 1 cm thick invading through the entire wall and into the underlying mesenteric fat. The tumor also abuts and focally involve the serosa. No extramural satellite lymph nodes noted. Did not seem to involve the peri-intestinal lymph nodes. Tumor was noted to have a germinal center  phenotype.  Patient is healing well from surgery. Continues to be on Imuran 100 mg by mouth daily. Has not reported any other peripheral enlarged lymph nodes. He is starting to eat better.   INTERVAL HISTORY  Luke Mcbride is here for follow-up appointment. He reports that he is doing well overall and enjoyed spending the holidays with his granchildren. Since his last visit he received a stent in the heart a month ago since his last visit, after a catheterization prompted by SOB and has been on plavix and aspirin since. He continues to take gabapentin for his neuropathy. No concerning new constitutional symptoms reported.  The pt notes a few lesions on his skin that his dermatologist Dr Nevada Crane has recently resected per pt was squamous cell carcinoma.   Discussed pt labwork today (06/06/17) CBC revealed RBC at 3.73, Hgb at 11.5, HCT at 36.5, MCHC at 31.5, RDW at 17.3, and Lymphs Abs at 0.8 with all other values WNL.   He notes Dr Marval Regal recommended not going back on the Imuran for vasculitis at this time and monitor.  On review of systems, pt reports improved SOB, tingling to bilateral feet, skin lesions (from his recent dermatologist visit) and denies fevers, mouth sores, chills, night sweats, leg swelling, new skin tumors, abdominal pains, groin swellings or bumps.                 MEDICAL HISTORY:  Past Medical History:  Diagnosis Date  . ALLERGIC RHINITIS   . Anemia    hx  . Barrett's esophagus   . BOOP (bronchiolitis obliterans with organizing pneumonia) ([place])    a. s/p R VATS 2008.  [name] CAD (coronary artery disease)  a. 05/2000: NSTEMI/CABG x 6: LIMA->LAD, VG->D1, VG->OM1->2, VG->PDA->RPL;  b. 07/2007 MV: high lat infarct, no ischemia, EF 47%;  c. Cath/PCI: LM nl, LAD20p, 9m, D1 nl, D2 60-70ost, D3 nl, LCX 70ost, 18m, OM1/OM2 min irregs, RCA 30 diff, PDA 99, LIMA->LAD atretic, VG->D1 100, VG->OM1->2 100, VG->PDA->RPL 90p (4.0x23 Vision BMS);  c. 07/2012 Echo: EF 55%, gr1 DD.  [name]  Cataract   . CKD (chronic kidney disease), stage III ([place])    a. renal bx 2008: GLN with vasculitis  . Diffuse large B cell lymphoma ([place]) 08/07/2016  . GERD (gastroesophageal reflux disease)   . Glaucoma    a. Cannot see out of L eye.  . Hematuria    Microscopic  . Hyperglycemia    Patient reported while on prednisone, had to take insulin  . Hyperlipemia   . Hypertension   . Hypothyroidism   . ILD (interstitial lung disease) ([place])   . Iritis   . Local infection of skin and subcutaneous tissue   . Membranoproliferative nephritis   . Myocardial infarction ([place]) 2002  . Neutropenia, drug-induced ([place])   . Recurrent boils   . Residual foreign body in soft tissue   . Vasculitis ([place])    a.  pauciimmune vasculitis with renal involvement and hx of transient hemoptysis in the past with associated BOOP, 2008 (renal bx 2008: GLN with vasculitis). b. History of treatment with 2 cycles of Cytoxan and pheresis. H/o hemoptysis and pulm hemorrhage with 2nd cycle of cytoxan.    SURGICAL HISTORY: Past Surgical History:  Procedure Laterality Date  . BOWEL RESECTION N/A 07/12/2016   Procedure: SMALL BOWEL RESECTION;  Surgeon: Rolm Bookbinder, MD;  Location: [facility];  Service: General;  Laterality: N/A;  . CATARACT EXTRACTION Bilateral   . CORONARY ARTERY BYPASS GRAFT  2002  . CORONARY STENT INTERVENTION N/A 03/31/2017   Procedure: CORONARY STENT INTERVENTION;  Surgeon: Martinique, Peter M, MD;  Location: [facility];  Service: Cardiovascular;  Laterality: N/A;  . IR FLUORO GUIDE PORT INSERTION RIGHT  08/20/2016  . IR REMOVAL TUN ACCESS W/ PORT W/O FL MOD SED  01/20/2017  . IR US GUIDE VASC ACCESS RIGHT  08/20/2016  . LAPAROSCOPY N/A 07/12/2016   Procedure: LAPAROSCOPY DIAGNOSTIC;  Surgeon: Rolm Bookbinder, MD;  Location: [facility];  Service: General;  Laterality: N/A;  . LEFT HEART CATHETERIZATION WITH CORONARY/GRAFT ANGIOGRAM N/A 07/26/2012   Procedure: LEFT HEART CATHETERIZATION WITH Beatrix Fetters;  Surgeon: Wellington Hampshire, MD;  Location: [facility];  Service: Cardiovascular;  Laterality: N/A;  . LUNG BIOPSY    . PERCUTANEOUS CORONARY STENT INTERVENTION (PCI-S)  07/26/2012   Procedure: PERCUTANEOUS CORONARY STENT INTERVENTION (PCI-S);  Surgeon: Wellington Hampshire, MD;  Location: [facility];  Service: Cardiovascular;;  . RENAL BIOPSY    . RIGHT/LEFT HEART CATH AND CORONARY/GRAFT ANGIOGRAPHY N/A 03/23/2017   Procedure: RIGHT/LEFT HEART CATH AND CORONARY/GRAFT ANGIOGRAPHY;  Surgeon: Martinique, Peter M, MD;  Location: [facility];  Service: Cardiovascular;  Laterality: N/A;  . TOE AMPUTATION  2009   hammer toe    SOCIAL HISTORY: Social History   Socioeconomic History  . Marital status: Married    Spouse name: Not on file  . Number of children: Not on file  . Years of education: Not on file  . Highest education level: Not on file  Social Needs  . Financial resource strain: Not on file  . Food insecurity - worry: Not on file  . Food insecurity - inability:  Not on file  . Transportation needs - medical: Not on file  . Transportation needs - non-medical: Not on file  Occupational History  . Occupation: [employer]  . Smoking status: Never Smoker  . Smokeless tobacco: Never Used  Substance and Sexual Activity  . Alcohol use: No  . Drug use: No  . Sexual activity: Not on file  Other Topics Concern  . Not on file  Social History Narrative   Regular exercise - yes      [facility] (07/12/16):   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  no pets currently. No bird or mold exposure.    FAMILY HISTORY: Family History  Problem Relation Age of Onset  . Heart disease Mother   . Diabetes Mother   . Prostate cancer Father   . Depression Other   . Diabetes Other   . Prostate cancer Other   . Colon polyps Neg Hx   . Colon cancer Neg Hx   . Rectal cancer Neg Hx   . Stomach cancer Neg Hx   . Esophageal cancer Neg Hx      ALLERGIES:  has No Known Allergies.  MEDICATIONS:  Current Outpatient Medications  Medication Sig Dispense Refill  . acetaminophen (TYLENOL) 500 MG tablet Take 500-1,000 mg by mouth daily as needed for moderate pain or headache.    [name] aspirin EC 81 MG EC tablet Take 1 tablet (81 mg total) by mouth daily. 30 tablet   . clopidogrel (PLAVIX) 75 MG tablet Take 1 tablet (75 mg total) by mouth daily. 90 tablet 1  . dorzolamide-timolol (COSOPT) 22.3-6.8 MG/ML ophthalmic solution Place 1 drop into both eyes 2 (two) times daily.     . finasteride (PROSCAR) 5 MG tablet Take 5 mg by mouth at bedtime.     . furosemide (LASIX) 20 MG tablet Take 1 tablet (20 mg total) daily by mouth. 30 tablet 6  . isosorbide-hydrALAZINE (BIDIL) 20-37.5 MG tablet Take 1 tablet by mouth 2 (two) times daily. 60 tablet 3  . levothyroxine (SYNTHROID, LEVOTHROID) 100 MCG tablet Take 100 mcg by mouth daily before breakfast.     . metoprolol succinate (TOPROL XL) 25 MG 24 hr tablet Take 1 tablet (25 mg total) by mouth daily. 90 tablet 3  . Multiple Vitamin (MULTIVITAMIN) tablet Take 1 tablet by mouth daily.      . nitroGLYCERIN (NITROSTAT) 0.4 MG SL tablet Place 1 tablet (0.4 mg total) under the tongue every 5 (five) minutes as needed for chest pain. 25 tablet 3  . pantoprazole (PROTONIX) 40 MG tablet TAKE 1 TABLET BY MOUTH  DAILY 90 tablet 3  . rosuvastatin (CRESTOR) 10 MG tablet Take 10 mg every morning by mouth.    . Tamsulosin HCl (FLOMAX) 0.4 MG CAPS Take 0.4 mg by mouth at bedtime.     [name] telmisartan (MICARDIS) 20 MG tablet Take 1 tablet (20 mg total) daily by mouth. 30 tablet 6   No current facility-administered medications for this visit.    Facility-Administered Medications Ordered in Other Visits  Medication Dose Route Frequency Provider Last Rate Last Dose  . sodium chloride flush (NS) 0.9 % injection 10 mL  10 mL Intracatheter PRN Brunetta Genera, MD   10 mL at 08/24/16 1653  . sodium chloride flush (NS)  0.9 % injection 10 mL  10 mL Intracatheter PRN Brunetta Genera, MD   10 mL at 10/07/16 1555    REVIEW OF SYSTEMS:  10 Point review of Systems was done is negative except as noted above.  PHYSICAL EXAMINATION: ECOG PERFORMANCE STATUS: 2 - Symptomatic, <50% confined to bed  . Vitals:   06/06/17 1116  BP: 102/68  Pulse: 85  Resp: 18  Temp: 98.6 F (37 C)  SpO2: 95%   Filed Weights   06/06/17 1116  Weight: 235 lb 9.6 oz (106.9 kg)   .Body mass index is 31.08 kg/m.  GENERAL:alert, in no acute distress and comfortable SKIN: no acute rashes, eschar from recent resection on his forearm, and an oblasion on his left pinna EYES: conjunctiva are pink and non-injected, sclera anicteric OROPHARYNX: MMM, no exudates, no oropharyngeal erythema or ulceration NECK: supple, no JVD LYMPH:  no palpable lymphadenopathy in the cervical, axillary or inguinal regions LUNGS: clear to auscultation b/l with normal respiratory effort HEART: regular rate & rhythm ABDOMEN:  normoactive bowel sounds , non tender, not distended. Healed laparoscopic surgical scars Extremity: 1+ pitting pedal edema improved from last visit PSYCH: alert & oriented x 3 with fluent speech NEURO: no focal motor/sensory deficits  LABORATORY DATA:  I have reviewed the data as listed . CBC Latest Ref Rng & Units 06/06/2017 04/01/2017 03/31/2017  WBC 4.0 - 10.3 K/uL 5.8 4.5 4.0  Hemoglobin 13.0 - 17.0 g/dL - 9.5(L) 9.2(L)  Hematocrit 38.4 - 49.9 % 36.5(L) 30.2(L) 29.2(L)  Platelets 140 - 400 K/uL 164 141(L) 151   . CMP Latest Ref Rng & Units 04/01/2017 03/31/2017 03/30/2017  Glucose 65 - 99 mg/dL 121(H) 108(H) 98  BUN 6 - 20 mg/dL 25(H) 26(H) 24(H)  Creatinine 0.61 - 1.24 mg/dL 1.86(H) 1.78(H) 1.82(H)  Sodium 135 - 145 mmol/L 139 139 138  Potassium 3.5 - 5.1 mmol/L 4.2 4.1 4.2  Chloride 101 - 111 mmol/L 112(H) 111 110  CO2 22 - 32 mmol/L 20(L) 22 22  Calcium 8.9 - 10.3 mg/dL 8.3(L) 8.0(L) 8.7(L)  Total Protein  6.4 - 8.3 g/dL - - -  Total Bilirubin 0.20 - 1.20 mg/dL - - -  Alkaline Phos 40 - 150 U/L - - -  AST 5 - 34 U/L - - -  ALT 0 - 55 U/L - - -    Lab Results  Component Value Date   LDH 254 (H) 06/06/2017     RADIOGRAPHIC STUDIES: I have personally reviewed the radiological images as listed and agreed with the findings in the report.  Nm Pet Image Restag (ps) Skull Base To Thigh  Result Date: 10/22/2016 CLINICAL DATA:  Subsequent treatment strategy for diffuse large B-cell non-Hodgkin's lymphoma. EXAM: NUCLEAR MEDICINE PET SKULL BASE TO THIGH TECHNIQUE: 11.15 mCi F-18 FDG was injected intravenously. Full-ring PET imaging was performed from the skull base to thigh after the radiotracer. CT data was obtained and used for attenuation correction and anatomic localization. FASTING BLOOD GLUCOSE:  Value: 102 mg/dl COMPARISON:  None. FINDINGS: NECK No hypermetabolic lymph nodes in the neck. CHEST The FDG avid right axillary lymph nodes and epicardial lymph nodes have resolved in the interval. The focal hypermetabolic activity in the left ventricular myocardium described on the previous study is unchanged. No new abnormalities in the chest. ABDOMEN/PELVIS Significant improvement in the abdomen. No definitive FDG avid disease remains in the liver or spleen. The adenopathy in the abdomen has resolved. Minimal uptake in the normal appearing left adrenal gland is of no significance. No abnormal uptake seen in the soft tissues of the pelvis. The omental nodularity seen previously has resolved. There is increased attenuation in the  fat associated with the anterior ventral hernia with low level uptake, likely inflammatory. The maximum SUV is measured on image 161 is 3.7. SKELETON There has been significant overall improvement in the bones as well. Uptake in the anterior left acetabulum has significantly diminished with a maximum SUV of 3.4 today versus 29.4 previously. The left iliac uptake seen previously is no  longer significantly FDG avid. There is mild focal uptake in the posterior left iliac bone on series 4, image 166 which was not seen previously and demonstrates a maximum SUV of 4.26 today. No other bony abnormalities are identified. IMPRESSION: 1. Significant interval improvement. No FDG avid disease remains in the soft tissues of the neck, chest, abdomen, or pelvis. Very mild uptake remains in the known left anterior acetabular lesion. Mild new uptake in the posterior left iliac bone is nonspecific but could represent mild involvement. Recommend attention on follow-up. 2. Increased attenuation and mild uptake in the fat of a lower midline ventral hernia is likely inflammatory. Recommend attention on follow-up. 3. No other interval change. Electronically Signed   By: Dorise Bullion III M.D   On: 10/22/2016 15:33   PET/CT 08/06/2016: IMPRESSION: 1. In addition to hypermetabolic nodal involvement of the right axilla, pericardial space, porta hepatis/mesenteric root, and retroperitoneum, there is extensive involvement of the liver as well as peritoneal involvement along the liver surface, splenic surface, and upper omentum. 2. Hypermetabolic bony lesions in the left pelvis compatible with malignant involvement. 3. There is a focus of hypermetabolic activity along the anterolateral wall of the left ventricle. Normally such findings turn out to simply be hypermetabolic myocardium. Strictly speaking given how focal this appears I cannot completely exclude the possibility of tumor involvement within or along the myocardium.   Electronically Signed   By: Van Clines M.D.   On: 08/06/2016 12:48   PET/CT 12/29/2016  IMPRESSION: 1. No evidence of residual or recurrent hypermetabolic lymphoma. 2.  Aortic Atherosclerosis (ICD10-I70.0). 3. Dilatation of the infrarenal aorta and pelvic vasculature, as detailed above.   Electronically Signed   By: Abigail Miyamoto M.D.   On: 12/29/2016  15:17   ASSESSMENT & PLAN:   82 yo male with multiple medical co-morbidities including cardiac co-morbidities, IPF, vascular on chronic immunosupression with    1) Stage IV Diffuse large B-cell lymphoma involving the small intestine (germinal cell phenotype) with complete thickness involvement of bowel and abutting serosa.  Initial PET/CT scan shows extensive involvement with DLBCL, hypermetabolic nodal involvement of the right axilla, pericardial space, porta hepatis/mesenteric root, and retroperitoneum, there is extensive involvement of the liver as well as peritoneal involvement along the liver surface, splenic surface, and upper omentum. 2. Hypermetabolic bony lesions in the left pelvis compatible with malignant involvement. 3. There is a focus of hypermetabolic activity along the anterolateral wall of the left ventricle.   s/p 6 cycles of R-CEOP .   PET/CT scan on 10/22/2016 after 3 cycles of chemotherapy shows excellent response to treatment. PET/CT scan on 12/29/2016 after 6 cycles of treatment shows No evidence of residual or recurrent hypermetabolic lymphoma  A)Status post neutropenic fever and E.coli/pseudomonal bacteremia/HCAP after the first cycle of treatment.  Significant chemotherapy related thrombocytopenia - now resolved. B) h/o hospitalization for perforation diverticulitis - no issues currently  2) chronic kidney disease stage III baseline creatinine about 2. Renal function stable . 3) coronary disease status post PCI and CABG - had a myocardial perfusion imaging scan on 06/30/2016 that showed a moderately decreased ejection fraction of 30-44%-  following with cardiology for management. Was having some SOB/DOE and had cardiac cath with PCI to mid LAD on11/15/2018. 4) history of pulmonary and renal vasculitis previously on chronic Imuran. Now off Imuran considering chemotherapy and Rituxan use.  5) Neutropenia -resolved 6) thrombocytopenia due to chemotherapy -resolved 7)  Anemia - much improved hgb is upto 11.5  Plan -patient had no clinical evidence of lymphoma recurrence at this time . -no indication for additional lymphoma treatment at this time. Labs stable. LDH borderline elevated - could be from recent URI and SCC ca resection/ablation- will monitor -pt no longer on Imuran, will watch and recommended to f/u with PCP .Donato Heinz, MD  -Recommended Prevnar and PCV to the pt today (06/06/17) to which the pt claimed was completed in the last 5 yrs. Records of vaccinations requested of the pt. -Recommended continued f/u with dermatology for screening/management of his recurrent cutaneous SCC                  RTC with Dr Irene Limbo in 3 months with labs. Earlier if any new lymphoma related concerns  All of the patients questions were answered with apparent satisfaction. The patient knows to call the clinic with any problems, questions or concerns.  I spent 20  minutes counseling the patient face to face. The total time spent in the appointment was 25 minutes and more than 50% was on counseling and direct patient cares.    Sullivan Lone MD [place] AAHIVMS [facility] Hematology/Oncology Physician [facility]  (Office):       [phone] (Work cell):  [phone] (Fax):           [phone]  This document serves as a record of services personally performed by Sullivan Lone, MD. It was created on his behalf by Baldwin Jamaica, a trained medical scribe. The creation of this record is based on the scribe's personal observations and the provider's statements to them.   .I have reviewed the above documentation for accuracy and completeness, and I agree with the above. Brunetta Genera MD MS

## 2017-06-06 ENCOUNTER — Telehealth: Payer: Self-pay | Admitting: Hematology

## 2017-06-06 ENCOUNTER — Inpatient Hospital Stay: Payer: Medicare Other

## 2017-06-06 ENCOUNTER — Inpatient Hospital Stay: Payer: Medicare Other | Attending: Hematology | Admitting: Hematology

## 2017-06-06 VITALS — BP 102/68 | HR 85 | Temp 98.6°F | Resp 18 | Ht 73.0 in | Wt 235.6 lb

## 2017-06-06 DIAGNOSIS — N183 Chronic kidney disease, stage 3 (moderate): Secondary | ICD-10-CM

## 2017-06-06 DIAGNOSIS — C8338 Diffuse large B-cell lymphoma, lymph nodes of multiple sites: Secondary | ICD-10-CM

## 2017-06-06 DIAGNOSIS — G620 Drug-induced polyneuropathy: Secondary | ICD-10-CM | POA: Insufficient documentation

## 2017-06-06 DIAGNOSIS — C8339 Diffuse large B-cell lymphoma, extranodal and solid organ sites: Secondary | ICD-10-CM | POA: Insufficient documentation

## 2017-06-06 DIAGNOSIS — C8330 Diffuse large B-cell lymphoma, unspecified site: Secondary | ICD-10-CM

## 2017-06-06 LAB — COMPREHENSIVE METABOLIC PANEL
ALK PHOS: 69 U/L (ref 40–150)
ALT: 17 U/L (ref 0–55)
AST: 17 U/L (ref 5–34)
Albumin: 3.6 g/dL (ref 3.5–5.0)
Anion gap: 8 (ref 3–11)
BILIRUBIN TOTAL: 0.4 mg/dL (ref 0.2–1.2)
BUN: 37 mg/dL — AB (ref 7–26)
CO2: 23 mmol/L (ref 22–29)
CREATININE: 2.24 mg/dL — AB (ref 0.70–1.30)
Calcium: 8.9 mg/dL (ref 8.4–10.4)
Chloride: 107 mmol/L (ref 98–109)
GFR calc Af Amer: 30 mL/min — ABNORMAL LOW (ref >60)
GFR calc non Af Amer: 26 mL/min — ABNORMAL LOW (ref >60)
GLUCOSE: 109 mg/dL (ref 70–140)
POTASSIUM: 5.3 mmol/L — AB (ref 3.5–5.1)
Sodium: 138 mmol/L (ref 136–145)
TOTAL PROTEIN: 6.9 g/dL (ref 6.4–8.3)

## 2017-06-06 LAB — CBC WITH DIFFERENTIAL (CANCER CENTER ONLY)
BASOS ABS: 0 10*3/uL (ref 0.0–0.1)
Basophils Relative: 1 %
EOS PCT: 2 %
Eosinophils Absolute: 0.1 10*3/uL (ref 0.0–0.5)
HCT: 36.5 % — ABNORMAL LOW (ref 38.4–49.9)
Hemoglobin: 11.5 g/dL — ABNORMAL LOW (ref 13.0–17.1)
LYMPHS PCT: 14 %
Lymphs Abs: 0.8 10*3/uL — ABNORMAL LOW (ref 0.9–3.3)
MCH: 30.8 pg (ref 27.2–33.4)
MCHC: 31.5 g/dL — ABNORMAL LOW (ref 32.0–36.0)
MCV: 97.9 fL (ref 79.3–98.0)
MONO ABS: 0.5 10*3/uL (ref 0.1–0.9)
Monocytes Relative: 8 %
Neutro Abs: 4.4 10*3/uL (ref 1.5–6.5)
Neutrophils Relative %: 75 %
PLATELETS: 164 10*3/uL (ref 140–400)
RBC: 3.73 MIL/uL — ABNORMAL LOW (ref 4.20–5.82)
RDW: 17.3 % — AB (ref 11.0–15.6)
WBC Count: 5.8 10*3/uL (ref 4.0–10.3)

## 2017-06-06 LAB — RETICULOCYTES
RBC.: 3.73 MIL/uL — ABNORMAL LOW (ref 4.20–5.82)
RETIC COUNT ABSOLUTE: 56 10*3/uL (ref 34.8–93.9)
Retic Ct Pct: 1.5 % (ref 0.8–1.8)

## 2017-06-06 LAB — LACTATE DEHYDROGENASE: LDH: 254 U/L — ABNORMAL HIGH (ref 125–245)

## 2017-06-06 NOTE — Telephone Encounter (Signed)
Gave patient avs and calendar with appts per 1/21 los.  °

## 2017-06-08 DIAGNOSIS — H44512 Absolute glaucoma, left eye: Secondary | ICD-10-CM | POA: Diagnosis not present

## 2017-06-08 DIAGNOSIS — H401211 Low-tension glaucoma, right eye, mild stage: Secondary | ICD-10-CM | POA: Diagnosis not present

## 2017-06-16 ENCOUNTER — Other Ambulatory Visit: Payer: Self-pay | Admitting: Cardiology

## 2017-06-16 ENCOUNTER — Other Ambulatory Visit: Payer: Self-pay | Admitting: Cardiovascular Disease

## 2017-06-16 NOTE — Telephone Encounter (Signed)
REFILL 

## 2017-07-25 ENCOUNTER — Other Ambulatory Visit: Payer: Self-pay

## 2017-07-25 ENCOUNTER — Other Ambulatory Visit: Payer: Self-pay | Admitting: *Deleted

## 2017-07-25 MED ORDER — METOPROLOL SUCCINATE ER 25 MG PO TB24: 25.0000 mg | ORAL_TABLET | Freq: Every day | ORAL | 1 refills | Status: DC

## 2017-07-31 ENCOUNTER — Other Ambulatory Visit: Payer: Self-pay | Admitting: Cardiology

## 2017-08-01 DIAGNOSIS — N400 Enlarged prostate without lower urinary tract symptoms: Secondary | ICD-10-CM | POA: Diagnosis not present

## 2017-08-01 DIAGNOSIS — E669 Obesity, unspecified: Secondary | ICD-10-CM | POA: Diagnosis not present

## 2017-08-01 DIAGNOSIS — E7800 Pure hypercholesterolemia, unspecified: Secondary | ICD-10-CM | POA: Diagnosis not present

## 2017-08-01 DIAGNOSIS — N057 Unspecified nephritic syndrome with diffuse crescentic glomerulonephritis: Secondary | ICD-10-CM | POA: Diagnosis not present

## 2017-08-01 DIAGNOSIS — R05 Cough: Secondary | ICD-10-CM | POA: Diagnosis not present

## 2017-08-01 DIAGNOSIS — R109 Unspecified abdominal pain: Secondary | ICD-10-CM | POA: Diagnosis not present

## 2017-08-01 DIAGNOSIS — N058 Unspecified nephritic syndrome with other morphologic changes: Secondary | ICD-10-CM | POA: Diagnosis not present

## 2017-08-01 DIAGNOSIS — I129 Hypertensive chronic kidney disease with stage 1 through stage 4 chronic kidney disease, or unspecified chronic kidney disease: Secondary | ICD-10-CM | POA: Diagnosis not present

## 2017-08-01 DIAGNOSIS — G629 Polyneuropathy, unspecified: Secondary | ICD-10-CM | POA: Diagnosis not present

## 2017-08-01 DIAGNOSIS — N183 Chronic kidney disease, stage 3 (moderate): Secondary | ICD-10-CM | POA: Diagnosis not present

## 2017-08-01 DIAGNOSIS — C8330 Diffuse large B-cell lymphoma, unspecified site: Secondary | ICD-10-CM | POA: Diagnosis not present

## 2017-08-01 DIAGNOSIS — I2510 Atherosclerotic heart disease of native coronary artery without angina pectoris: Secondary | ICD-10-CM | POA: Diagnosis not present

## 2017-09-05 ENCOUNTER — Inpatient Hospital Stay: Payer: Medicare Other

## 2017-09-05 ENCOUNTER — Inpatient Hospital Stay: Payer: Medicare Other | Admitting: Hematology and Oncology

## 2017-09-06 DIAGNOSIS — L570 Actinic keratosis: Secondary | ICD-10-CM | POA: Diagnosis not present

## 2017-09-06 DIAGNOSIS — B078 Other viral warts: Secondary | ICD-10-CM | POA: Diagnosis not present

## 2017-09-06 DIAGNOSIS — X32XXXD Exposure to sunlight, subsequent encounter: Secondary | ICD-10-CM | POA: Diagnosis not present

## 2017-09-20 ENCOUNTER — Ambulatory Visit (INDEPENDENT_AMBULATORY_CARE_PROVIDER_SITE_OTHER): Payer: Medicare Other | Admitting: Orthopaedic Surgery

## 2017-09-20 ENCOUNTER — Encounter (INDEPENDENT_AMBULATORY_CARE_PROVIDER_SITE_OTHER): Payer: Self-pay | Admitting: Orthopaedic Surgery

## 2017-09-20 ENCOUNTER — Ambulatory Visit (INDEPENDENT_AMBULATORY_CARE_PROVIDER_SITE_OTHER): Payer: Medicare Other

## 2017-09-20 DIAGNOSIS — M25561 Pain in right knee: Secondary | ICD-10-CM

## 2017-09-20 DIAGNOSIS — M1711 Unilateral primary osteoarthritis, right knee: Secondary | ICD-10-CM | POA: Diagnosis not present

## 2017-09-20 MED ORDER — BUPIVACAINE HCL 0.5 % IJ SOLN
2.0000 mL | INTRAMUSCULAR | Status: AC | PRN
  Administered 2017-09-20: 2 mL via INTRA_ARTICULAR

## 2017-09-20 MED ORDER — LIDOCAINE HCL 1 % IJ SOLN
2.0000 mL | INTRAMUSCULAR | Status: AC | PRN
  Administered 2017-09-20: 2 mL

## 2017-09-20 MED ORDER — METHYLPREDNISOLONE ACETATE 40 MG/ML IJ SUSP
40.0000 mg | INTRAMUSCULAR | Status: AC | PRN
Start: 2017-09-20 — End: 2017-09-20
  Administered 2017-09-20: 40 mg via INTRA_ARTICULAR

## 2017-09-20 NOTE — Addendum Note (Signed)
Addended by: Precious Bard on: 09/20/2017 10:35 AM   Modules accepted: Orders

## 2017-09-20 NOTE — Progress Notes (Signed)
Office Visit Note   Patient: Luke Mcbride           Date of Birth: 1935/08/13           MRN: [id number] Visit Date: 09/20/2017              Requested by: Donato Heinz, MD [place], [place] [address] PCP: Donato Heinz, MD   Assessment & Plan: Visit Diagnoses:  1. Unilateral primary osteoarthritis, right knee   2. Acute pain of right knee     Plan: Impression is right knee degenerative joint disease and arthritis exacerbation.  25 cc of joint fluid was aspirated and then injected with cortisone.  Patient tolerates well.  Follow-up as needed.  Follow-Up Instructions: Return if symptoms worsen or fail to improve.   Orders:  Orders Placed This Encounter  Procedures  . XR KNEE 3 VIEW RIGHT   No orders of the defined types were placed in this encounter.     Procedures: Large Joint Inj: R knee on 09/20/2017 10:15 AM Indications: pain Details: 22 G needle  Arthrogram: No  Medications: 40 mg methylPREDNISolone acetate 40 MG/ML; 2 mL lidocaine 1 %; 2 mL bupivacaine 0.5 % Aspirate: 25 mL serous; sent for lab analysis Outcome: tolerated well, no immediate complications Consent was given by the patient. Patient was prepped and draped in the usual sterile fashion.       Clinical Data: No additional findings.   Subjective: Chief Complaint  Patient presents with  . Right Knee - Pain, Edema    Patient is a 82 year old retired gentleman comes in with 2-day history of right knee pain and swelling.  He endorses burning pain with weightbearing.  He is currently in remission from lymphoma.  He denies any constitutional symptoms.  Denies any injuries.  Denies any numbness and tingling.   Review of Systems  Constitutional: Negative.   All other systems reviewed and are negative.    Objective: Vital Signs: There were no vitals taken for this visit.  Physical Exam  Constitutional: He is oriented to person, place, and time. He appears well-developed  and well-nourished.  HENT:  Head: Normocephalic and atraumatic.  Eyes: Pupils are equal, round, and reactive to light.  Neck: Neck supple.  Pulmonary/Chest: Effort normal.  Abdominal: Soft.  Musculoskeletal: Normal range of motion.  Neurological: He is alert and oriented to person, place, and time.  Skin: Skin is warm.  Psychiatric: He has a normal mood and affect. His behavior is normal. Judgment and thought content normal.  Nursing note and vitals reviewed.   Ortho Exam Right knee exam shows a moderate joint effusion.  Collaterals and cruciates are stable.  No warmth or redness or signs of infection. Specialty Comments:  No specialty comments available.  Imaging: Xr Knee 3 View Right  Result Date: 09/20/2017 Degenerative joint disease worst in the lateral compartment with valgus alignment    PMFS History: Patient Active Problem List   Diagnosis Date Noted  . CAD (coronary artery disease) 03/30/2017  . Dyspnea on effort   . CAD (coronary artery disease) of bypass graft 03/23/2017  . Angina pectoris ([place]) 03/23/2017  . Immunosuppressed due to chemotherapy   . Acute diverticulitis 10/30/2016  . Diverticulitis of colon with perforation 10/30/2016  . Counseling regarding advanced care planning and goals of care 09/16/2016  . Port catheter in place 09/15/2016  . Acute lower UTI 09/02/2016  . Pancytopenia ([place]) 09/02/2016  . Neutropenia with fever ([place]) 09/02/2016  . Sepsis ([place])  09/02/2016  . Chemotherapy-induced neutropenia ([place])   . Diffuse large B cell lymphoma ([place]) 08/07/2016  . S/P small bowel resection 07/12/2016  . Spinal stenosis 07/08/2015  . CKD (chronic kidney disease), stage III ([place]) 07/29/2012  . NSTEMI (non-ST elevated myocardial infarction) ([place]) 07/29/2012  . Vasculitis ([place])   . ALLERGIC RHINITIS 07/16/2010  . HYPERCHOLESTEROLEMIA 12/04/2008  . ANEMIA 12/04/2008  . IRITIS 12/04/2008  . RENAL INSUFFICIENCY 12/04/2008  . Coronary atherosclerosis  07/23/2008  . GERD 04/15/2008  . Barrett's esophagus 04/15/2008  . HYPERLIPIDEMIA 07/20/2007  . Essential hypertension, benign 05/23/2007  . FOREIGN BODY, SOFT TISSUE, RESIDUAL 04/24/2007  . INTERSTITIAL LUNG DISEASE 02/05/2007  . RESTLESS LEG SYNDROME, SEVERE 01/26/2007  . NEPHRITIS, MEMBRANOPROLIFERATIVE 01/26/2007  . Hypothyroidism 11/30/2006  . DIABETES MELLITUS, TYPE II 11/30/2006  . Myocardial infarction ([place]) 05/17/2000   Past Medical History:  Diagnosis Date  . ALLERGIC RHINITIS   . Anemia    hx  . Barrett's esophagus   . BOOP (bronchiolitis obliterans with organizing pneumonia) ([place])    a. s/p R VATS 2008.  [name] CAD (coronary artery disease)    a. 05/2000: NSTEMI/CABG x 6: LIMA->LAD, VG->D1, VG->OM1->2, VG->PDA->RPL;  b. 07/2007 MV: high lat infarct, no ischemia, EF 47%;  c. Cath/PCI: LM nl, LAD20p, 61m, D1 nl, D2 60-70ost, D3 nl, LCX 70ost, 143m, OM1/OM2 min irregs, RCA 30 diff, PDA 99, LIMA->LAD atretic, VG->D1 100, VG->OM1->2 100, VG->PDA->RPL 90p (4.0x23 Vision BMS);  c. 07/2012 Echo: EF 55%, gr1 DD.  [name] Cataract   . CKD (chronic kidney disease), stage III ([place])    a. renal bx 2008: GLN with vasculitis  . Diffuse large B cell lymphoma ([place]) 08/07/2016  . GERD (gastroesophageal reflux disease)   . Glaucoma    a. Cannot see out of L eye.  . Hematuria    Microscopic  . Hyperglycemia    Patient reported while on prednisone, had to take insulin  . Hyperlipemia   . Hypertension   . Hypothyroidism   . ILD (interstitial lung disease) ([place])   . Iritis   . Local infection of skin and subcutaneous tissue   . Membranoproliferative nephritis   . Myocardial infarction ([place]) 2002  . Neutropenia, drug-induced ([place])   . Recurrent boils   . Residual foreign body in soft tissue   . Vasculitis ([place])    a.  pauciimmune vasculitis with renal involvement and hx of transient hemoptysis in the past with associated BOOP, 2008 (renal bx 2008: GLN with vasculitis). b. History of treatment with  2 cycles of Cytoxan and pheresis. H/o hemoptysis and pulm hemorrhage with 2nd cycle of cytoxan.    Family History  Problem Relation Age of Onset  . Heart disease Mother   . Diabetes Mother   . Prostate cancer Father   . Depression Other   . Diabetes Other   . Prostate cancer Other   . Colon polyps Neg Hx   . Colon cancer Neg Hx   . Rectal cancer Neg Hx   . Stomach cancer Neg Hx   . Esophageal cancer Neg Hx     Past Surgical History:  Procedure Laterality Date  . BOWEL RESECTION N/A 07/12/2016   Procedure: SMALL BOWEL RESECTION;  Surgeon: Rolm Bookbinder, MD;  Location: [facility];  Service: General;  Laterality: N/A;  . CATARACT EXTRACTION Bilateral   . CORONARY ARTERY BYPASS GRAFT  2002  . CORONARY STENT INTERVENTION N/A 03/31/2017   Procedure: CORONARY STENT INTERVENTION;  Surgeon: Martinique, Peter M, MD;  Location:  [facility];  Service: Cardiovascular;  Laterality: N/A;  . IR FLUORO GUIDE PORT INSERTION RIGHT  08/20/2016  . IR REMOVAL TUN ACCESS W/ PORT W/O FL MOD SED  01/20/2017  . IR US GUIDE VASC ACCESS RIGHT  08/20/2016  . LAPAROSCOPY N/A 07/12/2016   Procedure: LAPAROSCOPY DIAGNOSTIC;  Surgeon: Rolm Bookbinder, MD;  Location: [facility];  Service: General;  Laterality: N/A;  . LEFT HEART CATHETERIZATION WITH CORONARY/GRAFT ANGIOGRAM N/A 07/26/2012   Procedure: LEFT HEART CATHETERIZATION WITH Beatrix Fetters;  Surgeon: Wellington Hampshire, MD;  Location: [facility]ATH [facility];  Service: Cardiovascular;  Laterality: N/A;  . LUNG BIOPSY    . PERCUTANEOUS CORONARY STENT INTERVENTION (PCI-S)  07/26/2012   Procedure: PERCUTANEOUS CORONARY STENT INTERVENTION (PCI-S);  Surgeon: Wellington Hampshire, MD;  Location: [facility] C[facility];  Service: Cardiovascular;;  . RENAL BIOPSY    . RIGHT/LEFT HEART CATH AND CORONARY/GRAFT ANGIOGRAPHY N/A 03/23/2017   Procedure: RIGHT/LEFT HEART CATH AND CORONARY/GRAFT ANGIOGRAPHY;  Surgeon: Martinique, Peter M, MD;  Location: [facility] CV [facility];  Service: Cardiovascular;   Laterality: N/A;  . TOE AMPUTATION  2009   hammer toe   Social History   Occupational History  . Occupation: [employer]  . Smoking status: Never Smoker  . Smokeless tobacco: Never Used  Substance and Sexual Activity  . Alcohol use: No  . Drug use: No  . Sexual activity: Not on file

## 2017-09-21 LAB — SYNOVIAL CELL COUNT + DIFF, W/ CRYSTALS
BASOPHILS, %: 0 %
EOSINOPHILS-SYNOVIAL: 0 % (ref 0–2)
Lymphocytes-Synovial Fld: 16 % (ref 0–74)
Monocyte/Macrophage: 36 % (ref 0–69)
NEUTROPHIL, SYNOVIAL: 47 % — AB (ref 0–24)
Synoviocytes, %: 1 % (ref 0–15)
WBC, Synovial: 161 cells/uL — ABNORMAL HIGH (ref <150)

## 2017-09-21 LAB — TIQ-NTM

## 2017-09-21 NOTE — Progress Notes (Signed)
Arthritis flare up fluid

## 2017-09-23 NOTE — Progress Notes (Signed)
HEMATOLOGY/ONCOLOGY CLINIC NOTE  Date of Service: 09/27/17  Patient Care Team: Donato Heinz, MD as PCP - General (Nephrology) Brunetta Genera, MD as Consulting Physician (Hematology) [name], MD as Consulting Physician (Cardiology)  CHIEF COMPLAINTS/PURPOSE OF CONSULTATION:  F/u for Diffuse large B cell lymphoma of the small intestine  HISTORY OF PRESENTING ILLNESS:   Luke Mcbride is a wonderful 81 y.o. male who has been referred to [place] by Dr .Donato Heinz, MD and Mauricio Po MD for evaluation and management of newly diagnosed diffuse large B-cell lymphoma of the small bowel.  Patient has a history of multiple medical comorbidities as noted below including coronary artery disease status post PCI and CABG, chronic kidney disease stage III, Barrett's esophagus, hypertension, dyslipidemia, interstitial lung disease, GERD, vasculitis involving the lungs and kidneys on chronic Imuran therapy.  He was having upper abdominal discomfort for a few months with loss of about 15-20 pounds. He had a CT scan of the abdomen done by Dr. Havery Moros on 06/15/2016 which showed a 3.3 x 3.2 x 2.4 cm indeterminate lesion involving the left upper quadrant small bowel loops. No additional small bowel lesions noted. No ascites. No retroperitoneal mesenteric or pelvic lymphadenopathy noted.  Patient was seen by Dr. Serita Grammes and underwent a diagnostic laparoscopy with small bowel resection on 07/12/2016. Pathology showed that the lesion was consistent with diffuse large B-cell lymphoma 2 cm in length and 4 cm in width centrally ulcerated mass involving 100% of the bowel circumference. The tumor was about 1 cm thick invading through the entire wall and into the underlying mesenteric fat. The tumor also abuts and focally involve the serosa. No extramural satellite lymph nodes noted. Did not seem to involve the peri-intestinal lymph nodes. Tumor was noted to have a germinal center  phenotype.  Patient is healing well from surgery. Continues to be on Imuran 100 mg by mouth daily. Has not reported any other peripheral enlarged lymph nodes. He is starting to eat better.   INTERVAL HISTORY   Luke Mcbride is here for follow-up for his Diffuse large B cell lymphoma of the small intestine. He was last seen by me in 05/2017. He presents to the clinic today accompanied by his wife. He notes he continues to follow with dermatology. He notes he was placed on gabapentin by PCP. And since then this helped with his neuropathy. With lower BP he denies lightheadedness or dizziness. He will see his cardiologist in June.  On review of symptoms, pt notes he has right knee pain from arthritis. He has fluid drawn off his right knee from swelling last week. He was given cortisone shot. He notes he gets pain depending on his position and still has some swelling. He notes of RLE. He notes this onset has been gradual. This is the same leg he had a vein grafted from. He denies new lumps or bumps on his skin. He denies fever, chill or night sweats. He denies abdominal pain or change in bowel movements.                 MEDICAL HISTORY:  Past Medical History:  Diagnosis Date   ALLERGIC RHINITIS    Anemia    hx   Barrett's esophagus    BOOP (bronchiolitis obliterans with organizing pneumonia) ([place])    a. s/p R VATS 2008.   CAD (coronary artery disease)    a. 05/2000: NSTEMI/CABG x 6: LIMA->LAD, VG->D1, VG->OM1->2, VG->PDA->RPL;  b. 07/2007 MV: high  lat infarct, no ischemia, EF 47%;  c. Cath/PCI: LM nl, LAD20p, 7m, D1 nl, D2 60-70ost, D3 nl, LCX 70ost, 183m, OM1/OM2 min irregs, RCA 30 diff, PDA 99, LIMA->LAD atretic, VG->D1 100, VG->OM1->2 100, VG->PDA->RPL 90p (4.0x23 Vision BMS);  c. 07/2012 Echo: EF 55%, gr1 DD.   Cataract    CKD (chronic kidney disease), stage III ([place])    a. renal bx 2008: GLN with vasculitis   Diffuse large B cell lymphoma ([place]) 08/07/2016   GERD (gastroesophageal  reflux disease)    Glaucoma    a. Cannot see out of L eye.   Hematuria    Microscopic   Hyperglycemia    Patient reported while on prednisone, had to take insulin   Hyperlipemia    Hypertension    Hypothyroidism    ILD (interstitial lung disease) ([place])    Iritis    Local infection of skin and subcutaneous tissue    Membranoproliferative nephritis    Myocardial infarction ([place]) 2002   Neutropenia, drug-induced ([facility])    Recurrent boils    Residual foreign body in soft tissue    Vasculitis ([place])    a.  pauciimmune vasculitis with renal involvement and hx of transient hemoptysis in the past with associated BOOP, 2008 (renal bx 2008: GLN with vasculitis). b. History of treatment with 2 cycles of Cytoxan and pheresis. H/o hemoptysis and pulm hemorrhage with 2nd cycle of cytoxan.    SURGICAL HISTORY: Past Surgical History:  Procedure Laterality Date   BOWEL RESECTION N/A 07/12/2016   Procedure: SMALL BOWEL RESECTION;  Surgeon: Rolm Bookbinder, MD;  Location: [facility];  Service: General;  Laterality: N/A;   CATARACT EXTRACTION Bilateral    CORONARY ARTERY BYPASS GRAFT  2002   CORONARY STENT INTERVENTION N/A 03/31/2017   Procedure: CORONARY STENT INTERVENTION;  Surgeon: Martinique, Peter M, MD;  Location: [facility];  Service: Cardiovascular;  Laterality: N/A;   IR FLUORO GUIDE PORT INSERTION RIGHT  08/20/2016   IR REMOVAL TUN ACCESS W/ PORT W/O FL MOD SED  01/20/2017   IR US GUIDE VASC ACCESS RIGHT  08/20/2016   LAPAROSCOPY N/A 07/12/2016   Procedure: LAPAROSCOPY DIAGNOSTIC;  Surgeon: Rolm Bookbinder, MD;  Location: [facility];  Service: General;  Laterality: N/A;   LEFT HEART CATHETERIZATION WITH CORONARY/GRAFT ANGIOGRAM N/A 07/26/2012   Procedure: LEFT HEART CATHETERIZATION WITH Beatrix Fetters;  Surgeon: Wellington Hampshire, MD;  Location: [facility];  Service: Cardiovascular;  Laterality: N/A;   LUNG BIOPSY     PERCUTANEOUS CORONARY STENT INTERVENTION  (PCI-S)  07/26/2012   Procedure: PERCUTANEOUS CORONARY STENT INTERVENTION (PCI-S);  Surgeon: Wellington Hampshire, MD;  Location: [facility];  Service: Cardiovascular;;   RENAL BIOPSY     RIGHT/LEFT HEART CATH AND CORONARY/GRAFT ANGIOGRAPHY N/A 03/23/2017   Procedure: RIGHT/LEFT HEART CATH AND CORONARY/GRAFT ANGIOGRAPHY;  Surgeon: Martinique, Peter M, MD;  Location: [facility];  Service: Cardiovascular;  Laterality: N/A;   TOE AMPUTATION  2009   hammer toe    SOCIAL HISTORY: Social History   Socioeconomic History   Marital status: Married    Spouse name: Not on file   Number of children: Not on file   Years of education: Not on file   Highest education level: Not on file  Occupational History   Occupation: Geophysical data processor strain: Not on file   Food insecurity:    Worry: Not on file    Inability: Not on Lexicographer  needs:    Medical: Not on file    Non-medical: Not on file  Tobacco Use   Smoking status: Never Smoker   Smokeless tobacco: Never Used  Substance and Sexual Activity   Alcohol use: No   Drug use: No   Sexual activity: Not on file  Lifestyle   Physical activity:    Days per week: Not on file    Minutes per session: Not on file   Stress: Not on file  Relationships   Social connections:    Talks on phone: Not on file    Gets together: Not on file    Attends religious service: Not on file    Active member of club or organization: Not on file    Attends meetings of clubs or organizations: Not on file    Relationship status: Not on file   Intimate partner violence:    Fear of current or ex partner: Not on file    Emotionally abused: Not on file    Physically abused: Not on file    Forced sexual activity: Not on file  Other Topics Concern   Not on file  Social History Narrative   Regular exercise - yes       Pulmonary (07/12/16):   Lives with his wife. Currently works  Careers adviser.  no pets currently. No bird or mold exposure.    FAMILY HISTORY: Family History  Problem Relation Age of Onset   Heart disease Mother    Diabetes Mother    Prostate cancer Father    Depression Other    Diabetes Other    Prostate cancer Other    Colon polyps Neg Hx    Colon cancer Neg Hx    Rectal cancer Neg Hx    Stomach cancer Neg Hx    Esophageal cancer Neg Hx     ALLERGIES:  has No Known Allergies.  MEDICATIONS:  Current Outpatient Medications  Medication Sig Dispense Refill   acetaminophen (TYLENOL) 500 MG tablet Take 500-1,000 mg by mouth daily as needed for moderate pain or headache.     aspirin EC 81 MG EC tablet Take 1 tablet (81 mg total) by mouth daily. 30 tablet    BIDIL 20-37.5 MG tablet TAKE 1 TABLET BY MOUTH TWICE A DAY 60 tablet 6   clopidogrel (PLAVIX) 75 MG tablet TAKE 1 TABLET BY MOUTH  DAILY 90 tablet 1   dorzolamide-timolol (COSOPT) 22.3-6.8 MG/ML ophthalmic solution Place 1 drop into both eyes 2 (two) times daily.      finasteride (PROSCAR) 5 MG tablet Take 5 mg by mouth at bedtime.      furosemide (LASIX) 20 MG tablet Take 1 tablet (20 mg total) daily by mouth. 30 tablet 6   furosemide (LASIX) 20 MG tablet TAKE 1 TABLET BY MOUTH  DAILY 90 tablet 2   levothyroxine (SYNTHROID, LEVOTHROID) 100 MCG tablet Take 100 mcg by mouth daily before breakfast.      metoprolol succinate (TOPROL XL) 25 MG 24 hr tablet Take 1 tablet (25 mg total) by mouth daily. 90 tablet 1   Multiple Vitamin (MULTIVITAMIN) tablet Take 1 tablet by mouth daily.       nitroGLYCERIN (NITROSTAT) 0.4 MG SL tablet Place 1 tablet (0.4 mg total) under the tongue every 5 (five) minutes as needed for chest pain. 25 tablet 3   pantoprazole (PROTONIX) 40 MG tablet TAKE 1 TABLET BY MOUTH  DAILY 90 tablet 3   rosuvastatin (CRESTOR) 10 MG tablet Take  10 mg every morning by mouth.     Tamsulosin HCl (FLOMAX) 0.4 MG CAPS Take 0.4 mg by mouth at bedtime.      telmisartan  (MICARDIS) 20 MG tablet Take 1 tablet (20 mg total) daily by mouth. 30 tablet 6   No current facility-administered medications for this visit.    Facility-Administered Medications Ordered in Other Visits  Medication Dose Route Frequency Provider Last Rate Last Dose   sodium chloride flush (NS) 0.9 % injection 10 mL  10 mL Intracatheter PRN Brunetta Genera, MD   10 mL at 08/24/16 1653   sodium chloride flush (NS) 0.9 % injection 10 mL  10 mL Intracatheter PRN Brunetta Genera, MD   10 mL at 10/07/16 1555    REVIEW OF SYSTEMS:  .10 Point review of Systems was done is negative except as noted above.   PHYSICAL EXAMINATION: ECOG PERFORMANCE STATUS: 2 - Symptomatic, <50% confined to bed  Vitals:   09/27/17 0911  BP: 98/61  Pulse: 83  Resp: 17  Temp: 98.3 F (36.8 C)  SpO2: 97%   Filed Weights   09/27/17 0911  Weight: 246 lb 8 oz (111.8 kg)   .Body mass index is 32.52 kg/m. [name] GENERAL:alert, in no acute distress and comfortable SKIN: no acute rashes, no significant lesions EYES: conjunctiva are pink and non-injected, sclera anicteric OROPHARYNX: MMM, no exudates, no oropharyngeal erythema or ulceration NECK: supple, no JVD LYMPH:  no palpable lymphadenopathy in the cervical, axillary or inguinal regions LUNGS: clear to auscultation b/l with normal respiratory effort HEART: regular rate & rhythm ABDOMEN:  normoactive bowel sounds , non tender, not distended. Extremity: 1+ pittingpedal edema PSYCH: alert & oriented x 3 with fluent speech NEURO: no focal motor/sensory deficits   LABORATORY DATA:  I have reviewed the data as listed . CBC Latest Ref Rng & Units 09/27/2017 06/06/2017 04/01/2017  WBC 4.0 - 10.3 K/uL 6.3 5.8 4.5  Hemoglobin 13.0 - 17.1 g/dL 12.1(L) 11.5(L) 9.5(L)  Hematocrit 38.4 - 49.9 % 37.6(L) 36.5(L) 30.2(L)  Platelets 140 - 400 K/uL 169 164 141(L)   . CMP Latest Ref Rng & Units 09/27/2017 06/06/2017 04/01/2017  Glucose 70 - 140 mg/dL 128 109  121(H)  BUN 7 - 26 mg/dL 49(H) 37(H) 25(H)  Creatinine 0.70 - 1.30 mg/dL 2.41(H) 2.24(H) 1.86(H)  Sodium 136 - 145 mmol/L 139 138 139  Potassium 3.5 - 5.1 mmol/L 4.9 5.3(H) 4.2  Chloride 98 - 109 mmol/L 108 107 112(H)  CO2 22 - 29 mmol/L 24 23 20(L)  Calcium 8.4 - 10.4 mg/dL 8.8 8.9 8.3(L)  Total Protein 6.4 - 8.3 g/dL 6.8 6.9 -  Total Bilirubin 0.2 - 1.2 mg/dL 0.4 0.4 -  Alkaline Phos 40 - 150 U/L 61 69 -  AST 5 - 34 U/L 15 17 -  ALT 0 - 55 U/L 17 17 -    Lab Results  Component Value Date   LDH 161 09/27/2017     RADIOGRAPHIC STUDIES: I have personally reviewed the radiological images as listed and agreed with the findings in the report.  Nm Pet Image Restag (ps) Skull Base To Thigh  Result Date: 10/22/2016 CLINICAL DATA:  Subsequent treatment strategy for diffuse large B-cell non-Hodgkin's lymphoma. EXAM: NUCLEAR MEDICINE PET SKULL BASE TO THIGH TECHNIQUE: 11.15 mCi F-18 FDG was injected intravenously. Full-ring PET imaging was performed from the skull base to thigh after the radiotracer. CT data was obtained and used for attenuation correction and anatomic localization. FASTING BLOOD GLUCOSE:  Value: 102 mg/dl COMPARISON:  None. FINDINGS: NECK No hypermetabolic lymph nodes in the neck. CHEST The FDG avid right axillary lymph nodes and epicardial lymph nodes have resolved in the interval. The focal hypermetabolic activity in the left ventricular myocardium described on the previous study is unchanged. No new abnormalities in the chest. ABDOMEN/PELVIS Significant improvement in the abdomen. No definitive FDG avid disease remains in the liver or spleen. The adenopathy in the abdomen has resolved. Minimal uptake in the normal appearing left adrenal gland is of no significance. No abnormal uptake seen in the soft tissues of the pelvis. The omental nodularity seen previously has resolved. There is increased attenuation in the fat associated with the anterior ventral hernia with low level  uptake, likely inflammatory. The maximum SUV is measured on image 161 is 3.7. SKELETON There has been significant overall improvement in the bones as well. Uptake in the anterior left acetabulum has significantly diminished with a maximum SUV of 3.4 today versus 29.4 previously. The left iliac uptake seen previously is no longer significantly FDG avid. There is mild focal uptake in the posterior left iliac bone on series 4, image 166 which was not seen previously and demonstrates a maximum SUV of 4.26 today. No other bony abnormalities are identified. IMPRESSION: 1. Significant interval improvement. No FDG avid disease remains in the soft tissues of the neck, chest, abdomen, or pelvis. Very mild uptake remains in the known left anterior acetabular lesion. Mild new uptake in the posterior left iliac bone is nonspecific but could represent mild involvement. Recommend attention on follow-up. 2. Increased attenuation and mild uptake in the fat of a lower midline ventral hernia is likely inflammatory. Recommend attention on follow-up. 3. No other interval change. Electronically Signed   By: Dorise Bullion III M.D   On: 10/22/2016 15:33   PET/CT 08/06/2016: IMPRESSION: 1. In addition to hypermetabolic nodal involvement of the right axilla, pericardial space, porta hepatis/mesenteric root, and retroperitoneum, there is extensive involvement of the liver as well as peritoneal involvement along the liver surface, splenic surface, and upper omentum. 2. Hypermetabolic bony lesions in the left pelvis compatible with malignant involvement. 3. There is a focus of hypermetabolic activity along the anterolateral wall of the left ventricle. Normally such findings turn out to simply be hypermetabolic myocardium. Strictly speaking given how focal this appears I cannot completely exclude the possibility of tumor involvement within or along the myocardium.   Electronically Signed   By: Van Clines M.D.   On:  08/06/2016 12:48   PET/CT 12/29/2016  IMPRESSION: 1. No evidence of residual or recurrent hypermetabolic lymphoma. 2.  Aortic Atherosclerosis (ICD10-I70.0). 3. Dilatation of the infrarenal aorta and pelvic vasculature, as detailed above.   Electronically Signed   By: Abigail Miyamoto M.D.   On: 12/29/2016 15:17   ASSESSMENT & PLAN:   82 yo male with multiple medical co-morbidities including cardiac co-morbidities, IPF, vascular on chronic immunosupression with    1) Stage IV Diffuse large B-cell lymphoma involving the small intestine (germinal cell phenotype) with complete thickness involvement of bowel and abutting serosa.  Initial PET/CT scan shows extensive involvement with DLBCL, hypermetabolic nodal involvement of the right axilla, pericardial space, porta hepatis/mesenteric root, and retroperitoneum, there is extensive involvement of the liver as well as peritoneal involvement along the liver surface, splenic surface, and upper omentum. 2. Hypermetabolic bony lesions in the left pelvis compatible with malignant involvement. 3. There is a focus of hypermetabolic activity along the anterolateral wall of the left ventricle.  s/p 6 cycles of R-CEOP .   PET/CT scan on 10/22/2016 after 3 cycles of chemotherapy shows excellent response to treatment. PET/CT scan on 12/29/2016 after 6 cycles of treatment shows No evidence of residual or recurrent hypermetabolic lymphoma  A)Sh/oneutropenic fever and E.coli/pseudomonal bacteremia/HCAP after the first cycle of treatment.  Significant chemotherapy related thrombocytopenia - now resolved. B) h/o hospitalization for perforation diverticulitis - no issues currently  2) chronic kidney disease stage III baseline creatinine about 2 to 2.5.  3) coronary disease status post PCI and CABG - had a myocardial perfusion imaging scan on 06/30/2016 that showed a moderately decreased ejection fraction of 30-44%- following with cardiology for management. Was  having some SOB/DOE and had cardiac cath with PCI to mid LAD on11/15/2018. 4) history of pulmonary and renal vasculitis previously on chronic Imuran. Now off Imuran considering chemotherapy and Rituxan use. 5) Neutropenia -resolved  6) thrombocytopenia due to chemotherapy -resolved with lymphocyte count at 0.8 7) Anemia - much improved hgb is up to 12.1  Plan   -I reviewed labs from today with pt in detail. Hg up to 12.1, his Cr stable and BUN has increased. I encouraged him to reduce caffeine, carbonated drinks and to increase water intake to hydrate.  -LDH WNL. -His BP is low at 98/61 today, I encouraged him to watch his weight for water retention while on Fluid pill. He should continue to closely be followed by his Cardiologist for any adjustments.  -patient had no clinical or labs evidence of lymphoma recurrence at this time -no indication for additional lymphoma treatment at this time. -Recommended continued f/u with dermatology for screening/management of his recurrent cutaneous SCC   8) Arthritis, especially in right knee -Week of 09/18/17 he had fluid drawn form right knee and given cortisone shot by PCP -Encouraged him to be active with walking or PT            RTC with Dr Irene Limbo in 3 months with labs.  All of the patients questions were answered with apparent satisfaction. The patient knows to call the clinic with any problems, questions or concerns.  . The total time spent in the appointment was 20 minutes and more than 50% was on counseling and direct patient cares.      Sullivan Lone MD [place] AAHIVMS [facility] Hematology/Oncology Physician [facility]  (Office):       [phone] (Work cell):  [phone] (Fax):           [phone]  This document serves as a record of services personally performed by Sullivan Lone, MD. It was created on his behalf by Joslyn Devon, a trained medical scribe. The creation of this record is based on the scribe's personal  observations and the provider's statements to them.    .I have reviewed the above documentation for accuracy and completeness, and I agree with the above.   Brunetta Genera MD MS

## 2017-09-27 ENCOUNTER — Encounter: Payer: Self-pay | Admitting: Hematology

## 2017-09-27 ENCOUNTER — Inpatient Hospital Stay (HOSPITAL_BASED_OUTPATIENT_CLINIC_OR_DEPARTMENT_OTHER): Payer: Medicare Other | Admitting: Hematology

## 2017-09-27 ENCOUNTER — Telehealth: Payer: Self-pay | Admitting: Hematology

## 2017-09-27 ENCOUNTER — Inpatient Hospital Stay: Payer: Medicare Other | Attending: Hematology

## 2017-09-27 VITALS — BP 98/61 | HR 83 | Temp 98.3°F | Resp 17 | Ht 73.0 in | Wt 246.5 lb

## 2017-09-27 DIAGNOSIS — Z7982 Long term (current) use of aspirin: Secondary | ICD-10-CM | POA: Diagnosis not present

## 2017-09-27 DIAGNOSIS — N183 Chronic kidney disease, stage 3 (moderate): Secondary | ICD-10-CM | POA: Diagnosis not present

## 2017-09-27 DIAGNOSIS — Z8042 Family history of malignant neoplasm of prostate: Secondary | ICD-10-CM

## 2017-09-27 DIAGNOSIS — C8339 Diffuse large B-cell lymphoma, extranodal and solid organ sites: Secondary | ICD-10-CM | POA: Diagnosis not present

## 2017-09-27 DIAGNOSIS — C8330 Diffuse large B-cell lymphoma, unspecified site: Secondary | ICD-10-CM

## 2017-09-27 LAB — RETICULOCYTES
RBC.: 3.74 MIL/uL — ABNORMAL LOW (ref 4.20–5.82)
RETIC COUNT ABSOLUTE: 78.5 10*3/uL (ref 34.8–93.9)
Retic Ct Pct: 2.1 % — ABNORMAL HIGH (ref 0.8–1.8)

## 2017-09-27 LAB — CBC WITH DIFFERENTIAL/PLATELET
BASOS ABS: 0 10*3/uL (ref 0.0–0.1)
Basophils Relative: 0 %
Eosinophils Absolute: 0.1 10*3/uL (ref 0.0–0.5)
Eosinophils Relative: 2 %
HCT: 37.6 % — ABNORMAL LOW (ref 38.4–49.9)
Hemoglobin: 12.1 g/dL — ABNORMAL LOW (ref 13.0–17.1)
LYMPHS ABS: 0.8 10*3/uL — AB (ref 0.9–3.3)
Lymphocytes Relative: 12 %
MCH: 32.4 pg (ref 27.2–33.4)
MCHC: 32.2 g/dL (ref 32.0–36.0)
MCV: 100.5 fL — AB (ref 79.3–98.0)
MONO ABS: 0.7 10*3/uL (ref 0.1–0.9)
MONOS PCT: 12 %
NEUTROS ABS: 4.6 10*3/uL (ref 1.5–6.5)
Neutrophils Relative %: 74 %
Platelets: 169 10*3/uL (ref 140–400)
RBC: 3.74 MIL/uL — ABNORMAL LOW (ref 4.20–5.82)
RDW: 16 % — AB (ref 11.0–14.6)
WBC: 6.3 10*3/uL (ref 4.0–10.3)

## 2017-09-27 LAB — CMP (CANCER CENTER ONLY)
ALT: 17 U/L (ref 0–55)
AST: 15 U/L (ref 5–34)
Albumin: 3.7 g/dL (ref 3.5–5.0)
Alkaline Phosphatase: 61 U/L (ref 40–150)
Anion gap: 7 (ref 3–11)
BUN: 49 mg/dL — ABNORMAL HIGH (ref 7–26)
CO2: 24 mmol/L (ref 22–29)
Calcium: 8.8 mg/dL (ref 8.4–10.4)
Chloride: 108 mmol/L (ref 98–109)
Creatinine: 2.41 mg/dL — ABNORMAL HIGH (ref 0.70–1.30)
GFR, Est AFR Am: 28 mL/min — ABNORMAL LOW
GFR, Estimated: 24 mL/min — ABNORMAL LOW
Glucose, Bld: 128 mg/dL (ref 70–140)
Potassium: 4.9 mmol/L (ref 3.5–5.1)
Sodium: 139 mmol/L (ref 136–145)
Total Bilirubin: 0.4 mg/dL (ref 0.2–1.2)
Total Protein: 6.8 g/dL (ref 6.4–8.3)

## 2017-09-27 LAB — LACTATE DEHYDROGENASE: LDH: 161 U/L (ref 125–245)

## 2017-09-27 NOTE — Telephone Encounter (Signed)
Scheduled appt per 5/14 los - Gave patient aVS and calender per los. 

## 2017-10-03 DIAGNOSIS — H401211 Low-tension glaucoma, right eye, mild stage: Secondary | ICD-10-CM | POA: Diagnosis not present

## 2017-10-03 DIAGNOSIS — H401111 Primary open-angle glaucoma, right eye, mild stage: Secondary | ICD-10-CM | POA: Diagnosis not present

## 2017-10-03 DIAGNOSIS — H44512 Absolute glaucoma, left eye: Secondary | ICD-10-CM | POA: Diagnosis not present

## 2017-10-03 DIAGNOSIS — H401131 Primary open-angle glaucoma, bilateral, mild stage: Secondary | ICD-10-CM | POA: Diagnosis not present

## 2017-10-03 DIAGNOSIS — H40023 Open angle with borderline findings, high risk, bilateral: Secondary | ICD-10-CM | POA: Diagnosis not present

## 2017-10-03 DIAGNOSIS — H401133 Primary open-angle glaucoma, bilateral, severe stage: Secondary | ICD-10-CM | POA: Diagnosis not present

## 2017-10-07 ENCOUNTER — Other Ambulatory Visit: Payer: Self-pay | Admitting: Cardiovascular Disease

## 2017-10-07 MED ORDER — ISOSORB DINITRATE-HYDRALAZINE 20-37.5 MG PO TABS: 1.0000 | ORAL_TABLET | Freq: Two times a day (BID) | ORAL | 1 refills | Status: DC

## 2017-10-07 MED ORDER — METOPROLOL SUCCINATE ER 25 MG PO TB24: 25.0000 mg | ORAL_TABLET | Freq: Every day | ORAL | 1 refills | Status: DC

## 2017-10-12 ENCOUNTER — Encounter (INDEPENDENT_AMBULATORY_CARE_PROVIDER_SITE_OTHER): Payer: Self-pay | Admitting: Orthopaedic Surgery

## 2017-10-12 ENCOUNTER — Telehealth (INDEPENDENT_AMBULATORY_CARE_PROVIDER_SITE_OTHER): Payer: Self-pay

## 2017-10-12 ENCOUNTER — Ambulatory Visit (INDEPENDENT_AMBULATORY_CARE_PROVIDER_SITE_OTHER): Payer: Medicare Other | Admitting: Orthopaedic Surgery

## 2017-10-12 DIAGNOSIS — M1711 Unilateral primary osteoarthritis, right knee: Secondary | ICD-10-CM | POA: Diagnosis not present

## 2017-10-12 MED ORDER — DICLOFENAC SODIUM 1 % TD GEL: 2.0000 g | Freq: Four times a day (QID) | TRANSDERMAL | 11 refills | Status: DC

## 2017-10-12 NOTE — Telephone Encounter (Signed)
Please submit for gel injection- [place] DR Erlinda Hong

## 2017-10-12 NOTE — Progress Notes (Signed)
Office Visit Note   Patient: Luke Mcbride           Date of Birth: 1936-04-18           MRN: [id number] Visit Date: 10/12/2017              Requested by: Donato Heinz, MD [place], [place] [id number] PCP: Donato Heinz, MD   Assessment & Plan: Visit Diagnoses:  1. Unilateral primary osteoarthritis, right knee     Plan: Impression is end-stage tricompartmental degenerative joint disease.  Patient unfortunately is a poor surgical candidate with multiple medical comorbidities.  We will submit preauthorization for HA injection.  Questions encouraged and answered.  Follow-Up Instructions: Return if symptoms worsen or fail to improve.   Orders:  No orders of the defined types were placed in this encounter.  Meds ordered this encounter  Medications  . diclofenac sodium (VOLTAREN) 1 % GEL    Sig: Apply 2 g topically 4 (four) times daily.    Dispense:  1 Tube    Refill:  11      Procedures: No procedures performed   Clinical Data: No additional findings.   Subjective: Chief Complaint  Patient presents with  . Right Knee - Pain    Luke Mcbride comes in today for continued right knee pain.  I saw him about 3 weeks ago and he states that he did not get significant relief from the cortisone injection.  He has constant burning pain.  He takes Tylenol.   Review of Systems  Constitutional: Negative.   All other systems reviewed and are negative.    Objective: Vital Signs: There were no vitals taken for this visit.  Physical Exam  Constitutional: He is oriented to person, place, and time. He appears well-developed and well-nourished.  Pulmonary/Chest: Effort normal.  Abdominal: Soft.  Neurological: He is alert and oriented to person, place, and time.  Skin: Skin is warm.  Psychiatric: He has a normal mood and affect. His behavior is normal. Judgment and thought content normal.  Nursing note and vitals reviewed.   Ortho Exam Right knee exam shows no  joint effusion.  Pitting edema of the right lower extremity. Specialty Comments:  No specialty comments available.  Imaging: No results found.   PMFS History: Patient Active Problem List   Diagnosis Date Noted  . CAD (coronary artery disease) 03/30/2017  . Dyspnea on effort   . CAD (coronary artery disease) of bypass graft 03/23/2017  . Angina pectoris ([place]) 03/23/2017  . Immunosuppressed due to chemotherapy   . Acute diverticulitis 10/30/2016  . Diverticulitis of colon with perforation 10/30/2016  . Counseling regarding advanced care planning and goals of care 09/16/2016  . Port catheter in place 09/15/2016  . Acute lower UTI 09/02/2016  . Pancytopenia ([place]) 09/02/2016  . Neutropenia with fever ([place]) 09/02/2016  . Sepsis ([employer]) 09/02/2016  . Chemotherapy-induced neutropenia ([place])   . Diffuse large B cell lymphoma ([place]) 08/07/2016  . S/P small bowel resection 07/12/2016  . Spinal stenosis 07/08/2015  . CKD (chronic kidney disease), stage III ([place]) 07/29/2012  . NSTEMI (non-ST elevated myocardial infarction) ([place]) 07/29/2012  . Vasculitis ([place])   . ALLERGIC RHINITIS 07/16/2010  . HYPERCHOLESTEROLEMIA 12/04/2008  . ANEMIA 12/04/2008  . IRITIS 12/04/2008  . RENAL INSUFFICIENCY 12/04/2008  . Coronary atherosclerosis 07/23/2008  . GERD 04/15/2008  . Barrett's esophagus 04/15/2008  . HYPERLIPIDEMIA 07/20/2007  . Essential hypertension, benign 05/23/2007  . FOREIGN BODY, SOFT TISSUE, RESIDUAL 04/24/2007  . INTERSTITIAL  LUNG DISEASE 02/05/2007  . RESTLESS LEG SYNDROME, SEVERE 01/26/2007  . NEPHRITIS, MEMBRANOPROLIFERATIVE 01/26/2007  . Hypothyroidism 11/30/2006  . DIABETES MELLITUS, TYPE II 11/30/2006  . Myocardial infarction ([place]) 05/17/2000   Past Medical History:  Diagnosis Date  . ALLERGIC RHINITIS   . Anemia    hx  . Barrett's esophagus   . BOOP (bronchiolitis obliterans with organizing pneumonia) ([place])    a. s/p R VATS 2008.  [name] CAD (coronary artery disease)     a. 05/2000: NSTEMI/CABG x 6: LIMA->LAD, VG->D1, VG->OM1->2, VG->PDA->RPL;  b. 07/2007 MV: high lat infarct, no ischemia, EF 47%;  c. Cath/PCI: LM nl, LAD20p, 41m, D1 nl, D2 60-70ost, D3 nl, LCX 70ost, 143m, OM1/OM2 min irregs, RCA 30 diff, PDA 99, LIMA->LAD atretic, VG->D1 100, VG->OM1->2 100, VG->PDA->RPL 90p (4.0x23 Vision BMS);  c. 07/2012 Echo: EF 55%, gr1 DD.  [name] Cataract   . CKD (chronic kidney disease), stage III ([place])    a. renal bx 2008: GLN with vasculitis  . Diffuse large B cell lymphoma ([place]) 08/07/2016  . GERD (gastroesophageal reflux disease)   . Glaucoma    a. Cannot see out of L eye.  . Hematuria    Microscopic  . Hyperglycemia    Patient reported while on prednisone, had to take insulin  . Hyperlipemia   . Hypertension   . Hypothyroidism   . ILD (interstitial lung disease) ([place])   . Iritis   . Local infection of skin and subcutaneous tissue   . Membranoproliferative nephritis   . Myocardial infarction ([place]) 2002  . Neutropenia, drug-induced ([place])   . Recurrent boils   . Residual foreign body in soft tissue   . Vasculitis ([place])    a.  pauciimmune vasculitis with renal involvement and hx of transient hemoptysis in the past with associated BOOP, 2008 (renal bx 2008: GLN with vasculitis). b. History of treatment with 2 cycles of Cytoxan and pheresis. H/o hemoptysis and pulm hemorrhage with 2nd cycle of cytoxan.    Family History  Problem Relation Age of Onset  . Heart disease Mother   . Diabetes Mother   . Prostate cancer Father   . Depression Other   . Diabetes Other   . Prostate cancer Other   . Colon polyps Neg Hx   . Colon cancer Neg Hx   . Rectal cancer Neg Hx   . Stomach cancer Neg Hx   . Esophageal cancer Neg Hx     Past Surgical History:  Procedure Laterality Date  . BOWEL RESECTION N/A 07/12/2016   Procedure: SMALL BOWEL RESECTION;  Surgeon: Rolm Bookbinder, MD;  Location: [facility];  Service: General;  Laterality: N/A;  . CATARACT EXTRACTION Bilateral   .  CORONARY ARTERY BYPASS GRAFT  2002  . CORONARY STENT INTERVENTION N/A 03/31/2017   Procedure: CORONARY STENT INTERVENTION;  Surgeon: Martinique, Peter M, MD;  Location: [facility];  Service: Cardiovascular;  Laterality: N/A;  . IR FLUORO GUIDE PORT INSERTION RIGHT  08/20/2016  . IR REMOVAL TUN ACCESS W/ PORT W/O FL MOD SED  01/20/2017  . IR US GUIDE VASC ACCESS RIGHT  08/20/2016  . LAPAROSCOPY N/A 07/12/2016   Procedure: LAPAROSCOPY DIAGNOSTIC;  Surgeon: Rolm Bookbinder, MD;  Location: [facility];  Service: General;  Laterality: N/A;  . LEFT HEART CATHETERIZATION WITH CORONARY/GRAFT ANGIOGRAM N/A 07/26/2012   Procedure: LEFT HEART CATHETERIZATION WITH Beatrix Fetters;  Surgeon: Wellington Hampshire, MD;  Location: [facility];  Service: Cardiovascular;  Laterality: N/A;  . LUNG BIOPSY    .  PERCUTANEOUS CORONARY STENT INTERVENTION (PCI-S)  07/26/2012   Procedure: PERCUTANEOUS CORONARY STENT INTERVENTION (PCI-S);  Surgeon: Wellington Hampshire, MD;  Location: [facility];  Service: Cardiovascular;;  . RENAL BIOPSY    . RIGHT/LEFT HEART CATH AND CORONARY/GRAFT ANGIOGRAPHY N/A 03/23/2017   Procedure: RIGHT/LEFT HEART CATH AND CORONARY/GRAFT ANGIOGRAPHY;  Surgeon: Martinique, Peter M, MD;  Location: [facility];  Service: Cardiovascular;  Laterality: N/A;  . TOE AMPUTATION  2009   hammer toe   Social History   Occupational History  . Occupation: [employer]  . Smoking status: Never Smoker  . Smokeless tobacco: Never Used  Substance and Sexual Activity  . Alcohol use: No  . Drug use: No  . Sexual activity: Not on file

## 2017-10-12 NOTE — Telephone Encounter (Signed)
Noted  

## 2017-10-14 ENCOUNTER — Telehealth (INDEPENDENT_AMBULATORY_CARE_PROVIDER_SITE_OTHER): Payer: Self-pay

## 2017-10-14 NOTE — Telephone Encounter (Signed)
Submitted application online for Monovisc injection, right knee. 

## 2017-10-17 ENCOUNTER — Encounter: Payer: Self-pay | Admitting: Cardiovascular Disease

## 2017-10-17 ENCOUNTER — Ambulatory Visit (HOSPITAL_COMMUNITY)
Admission: RE | Admit: 2017-10-17 | Discharge: 2017-10-17 | Disposition: A | Discharge status: Home / Self Care | Payer: Medicare Other | Source: Ambulatory Visit | Attending: Internal Medicine | Admitting: Internal Medicine

## 2017-10-17 ENCOUNTER — Ambulatory Visit (INDEPENDENT_AMBULATORY_CARE_PROVIDER_SITE_OTHER): Payer: Medicare Other | Admitting: Cardiovascular Disease

## 2017-10-17 VITALS — BP 110/70 | HR 91 | Ht 73.0 in | Wt 243.5 lb

## 2017-10-17 DIAGNOSIS — I2510 Atherosclerotic heart disease of native coronary artery without angina pectoris: Secondary | ICD-10-CM | POA: Diagnosis not present

## 2017-10-17 DIAGNOSIS — I959 Hypotension, unspecified: Secondary | ICD-10-CM

## 2017-10-17 DIAGNOSIS — M7989 Other specified soft tissue disorders: Secondary | ICD-10-CM | POA: Diagnosis not present

## 2017-10-17 DIAGNOSIS — M79604 Pain in right leg: Secondary | ICD-10-CM

## 2017-10-17 DIAGNOSIS — I2583 Coronary atherosclerosis due to lipid rich plaque: Secondary | ICD-10-CM | POA: Diagnosis not present

## 2017-10-17 NOTE — Patient Instructions (Addendum)
Medication Instructions:  Your physician recommends that you continue on your current medications as directed. Please refer to the Current Medication list given to you today.  Labwork: NONE  Testing/Procedures: Your physician has requested that you have a ultra sound to rule out DVT of right lower extremity venous duplex STAT.   Follow-Up: Your physician wants you to follow-up in: 6 months with Dr. [name]. You will receive a reminder letter in the mail two months in advance. If you don't receive a letter, please call our office to schedule the follow-up appointment.   If you need a refill on your cardiac medications before your next appointment, please call your pharmacy.

## 2017-10-17 NOTE — Progress Notes (Signed)
Cardiology Office Note   Date:  10/17/2017   ID:  Luke Mcbride, DOB [birth year], MRN [id number]  PCP:  Luke Heinz, MD  Cardiologist:  Dr. Johnsie Mcbride    No chief complaint on file.     History of Present Illness: Luke Mcbride is a 82 y.o. male who presents for SOB.    He had CABG X 6 in 2002. He was admitted 07/2012 with NSTEMI and underwent BMS to the SVR-PDA/PL. His LIMA to LAD was atretic but disease in the LAD was moderate. SVG-OM and SVG to diagonal were known to be occluded. Other PMH includes DM, HTN, HLD and CKD.   Diagnosed with Stage IV Diffuse large B-cell lymphoma involving the small intestine. He required surgery. Prior to surgery, he had a nuclear stress test on 07/01/16 which showed scar but no ischemia. He was cleared to undergo surgery. He had 6 inchesof his colon resected. Following surgery he had 6 rounds of chemo. F/u PET scan and was told he was in remission.   November seen for DOE, echo revealed EF 35-40% down from 55%. Seen by PA 03/18/17 and cath set up done by Dr Martinique 03/23/17 Reviewed films Had staged stent to the mid native LAD Lasix 20 mg And micardis 20 mg resumed on d/c   1. Severe 3 vessel obstructive CAD. 2. Patent SVG to PDA/PLOM. Stent in proximal SVG is patent 3. Occluded SVG to the first diagonal. The first diagonal is small in caliber 4. Occluded SVG to OM 5. Atretic LIMA to the LAD 6. Normal LVEDP 7. Normal right heart pressures.   No dyspnea Activity limited by back pain and neuropathy in legs ? From chemo.  Lots of right knee pain "bone on bone"  More RLE swelling   Past Medical History:  Diagnosis Date  . ALLERGIC RHINITIS   . Anemia    hx  . Barrett's esophagus   . BOOP (bronchiolitis obliterans with organizing pneumonia) ([place])    a. s/p R VATS 2008.  [name] CAD (coronary artery disease)    a. 05/2000: NSTEMI/CABG x 6: LIMA->LAD, VG->D1, VG->OM1->2, VG->PDA->RPL;  b. 07/2007 MV: high lat infarct, no ischemia, EF 47%;  c.  Cath/PCI: LM nl, LAD20p, 71m, D1 nl, D2 60-70ost, D3 nl, LCX 70ost, 125m, OM1/OM2 min irregs, RCA 30 diff, PDA 99, LIMA->LAD atretic, VG->D1 100, VG->OM1->2 100, VG->PDA->RPL 90p (4.0x23 Vision BMS);  c. 07/2012 Echo: EF 55%, gr1 DD.  [name] Cataract   . CKD (chronic kidney disease), stage III ([place])    a. renal bx 2008: GLN with vasculitis  . Diffuse large B cell lymphoma ([place]) 08/07/2016  . GERD (gastroesophageal reflux disease)   . Glaucoma    a. Cannot see out of L eye.  . Hematuria    Microscopic  . Hyperglycemia    Patient reported while on prednisone, had to take insulin  . Hyperlipemia   . Hypertension   . Hypothyroidism   . ILD (interstitial lung disease) ([place])   . Iritis   . Local infection of skin and subcutaneous tissue   . Membranoproliferative nephritis   . Myocardial infarction ([place]) 2002  . Neutropenia, drug-induced ([place])   . Recurrent boils   . Residual foreign body in soft tissue   . Vasculitis ([place])    a.  pauciimmune vasculitis with renal involvement and hx of transient hemoptysis in the past with associated BOOP, 2008 (renal bx 2008: GLN with vasculitis). b. History of treatment with 2 cycles of Cytoxan and  pheresis. H/o hemoptysis and pulm hemorrhage with 2nd cycle of cytoxan.    Past Surgical History:  Procedure Laterality Date  . BOWEL RESECTION N/A 07/12/2016   Procedure: SMALL BOWEL RESECTION;  Surgeon: Luke Bookbinder, MD;  Location: [facility];  Service: General;  Laterality: N/A;  . CATARACT EXTRACTION Bilateral   . CORONARY ARTERY BYPASS GRAFT  2002  . CORONARY STENT INTERVENTION N/A 03/31/2017   Procedure: CORONARY STENT INTERVENTION;  Surgeon: Martinique, Peter M, MD;  Location: [facility];  Service: Cardiovascular;  Laterality: N/A;  . IR FLUORO GUIDE PORT INSERTION RIGHT  08/20/2016  . IR REMOVAL TUN ACCESS W/ PORT W/O FL MOD SED  01/20/2017  . IR US GUIDE VASC ACCESS RIGHT  08/20/2016  . LAPAROSCOPY N/A 07/12/2016   Procedure: LAPAROSCOPY DIAGNOSTIC;   Surgeon: Luke Bookbinder, MD;  Location: [facility];  Service: General;  Laterality: N/A;  . LEFT HEART CATHETERIZATION WITH CORONARY/GRAFT ANGIOGRAM N/A 07/26/2012   Procedure: LEFT HEART CATHETERIZATION WITH Beatrix Fetters;  Surgeon: Luke Hampshire, MD;  Location: [facility] CATH LAB;  Service: Cardiovascular;  Laterality: N/A;  . LUNG BIOPSY    . PERCUTANEOUS CORONARY STENT INTERVENTION (PCI-S)  07/26/2012   Procedure: PERCUTANEOUS CORONARY STENT INTERVENTION (PCI-S);  Surgeon: Luke Hampshire, MD;  Location: [facility] CATH LAB;  Service: Cardiovascular;;  . RENAL BIOPSY    . RIGHT/LEFT HEART CATH AND CORONARY/GRAFT ANGIOGRAPHY N/A 03/23/2017   Procedure: RIGHT/LEFT HEART CATH AND CORONARY/GRAFT ANGIOGRAPHY;  Surgeon: Martinique, Peter M, MD;  Location: [facility];  Service: Cardiovascular;  Laterality: N/A;  . TOE AMPUTATION  2009   hammer toe     Current Outpatient Medications  Medication Sig Dispense Refill  . acetaminophen (TYLENOL) 500 MG tablet Take 500-1,000 mg by mouth daily as needed for moderate pain or headache.    [name] aspirin EC 81 MG EC tablet Take 1 tablet (81 mg total) by mouth daily. 30 tablet   . clopidogrel (PLAVIX) 75 MG tablet TAKE 1 TABLET BY MOUTH  DAILY 90 tablet 1  . diclofenac sodium (VOLTAREN) 1 % GEL Apply 2 g topically 4 (four) times daily. 1 Tube 11  . dorzolamide-timolol (COSOPT) 22.3-6.8 MG/ML ophthalmic solution Place 1 drop into both eyes 2 (two) times daily.     . finasteride (PROSCAR) 5 MG tablet Take 5 mg by mouth at bedtime.     . furosemide (LASIX) 20 MG tablet TAKE 1 TABLET BY MOUTH  DAILY 90 tablet 2  . gabapentin (NEURONTIN) 100 MG capsule Take 100 mg by mouth 2 (two) times daily.    . isosorbide-hydrALAZINE (BIDIL) 20-37.5 MG tablet Take 1 tablet by mouth 2 (two) times daily. 180 tablet 1  . levothyroxine (SYNTHROID, LEVOTHROID) 100 MCG tablet Take 100 mcg by mouth daily before breakfast.     . metoprolol succinate (TOPROL XL) 25 MG 24 hr tablet Take 1  tablet (25 mg total) by mouth daily. 90 tablet 1  . Multiple Vitamin (MULTIVITAMIN) tablet Take 1 tablet by mouth daily.      . nitroGLYCERIN (NITROSTAT) 0.4 MG SL tablet Place 1 tablet (0.4 mg total) under the tongue every 5 (five) minutes as needed for chest pain. 25 tablet 3  . pantoprazole (PROTONIX) 40 MG tablet TAKE 1 TABLET BY MOUTH  DAILY 90 tablet 3  . rosuvastatin (CRESTOR) 10 MG tablet Take 10 mg every morning by mouth.    . Tamsulosin HCl (FLOMAX) 0.4 MG CAPS Take 0.4 mg by mouth at bedtime.     [name]  telmisartan (MICARDIS) 20 MG tablet Take 1 tablet (20 mg total) daily by mouth. 30 tablet 6   No current facility-administered medications for this visit.    Facility-Administered Medications Ordered in Other Visits  Medication Dose Route Frequency Provider Last Rate Last Dose  . sodium chloride flush (NS) 0.9 % injection 10 mL  10 mL Intracatheter PRN Brunetta Genera, MD   10 mL at 08/24/16 1653  . sodium chloride flush (NS) 0.9 % injection 10 mL  10 mL Intracatheter PRN Brunetta Genera, MD   10 mL at 10/07/16 1555    Allergies:   Patient has no known allergies.    Social History:  The patient  reports that he has never smoked. He has never used smokeless tobacco. He reports that he does not drink alcohol or use drugs.   Family History:  The patient's family history includes Depression in his other; Diabetes in his mother and other; Heart disease in his mother; Prostate cancer in his father and other.    ROS:  General:no colds or fevers, no weight changes Skin:no rashes or ulcers HEENT:no blurred vision, no congestion CV:see HPI PUL:see HPI GI:no diarrhea constipation or melena, no indigestion GU:no hematuria, no dysuria MS:no joint pain, no claudication Neuro:no syncope, no lightheadedness Endo:no diabetes, + thyroid disease  Wt Readings from Last 3 Encounters:  10/17/17 243 lb 8 oz (110.5 kg)  09/27/17 246 lb 8 oz (111.8 kg)  06/06/17 235 lb 9.6 oz (106.9 kg)       PHYSICAL EXAM: VS:  BP 110/70   Pulse 91   Ht 6\' 1"  (1.854 Mcbride)   Wt 243 lb 8 oz (110.5 kg)   SpO2 96%   BMI 32.13 kg/Mcbride  , BMI Body mass index is 32.13 kg/Mcbride. Affect appropriate Chronically ill white male  HEENT: normal Neck supple with no adenopathy JVP normal no bruits no thyromegaly Lungs clear with no wheezing and good diaphragmatic motion Heart:  S1/S2 no murmur, no rub, gallop or click PMI normal Abdomen: benighn, BS positve, no tenderness, no AAA no bruit.  No HSM or HJR Distal pulses intact with no bruits Plus 2 RLE edema plus one LLE edema  Neuro non-focal Skin warm and dry No muscular weakness   EKG: 04/01/17 SR nonspecific ST/T wave changes    Recent Labs: 01/21/2017: NT-Pro BNP 880 09/27/2017: ALT 17; BUN 49; Creatinine 2.41; Hemoglobin 12.1; Platelets 169; Potassium 4.9; Sodium 139    Lipid Panel    Component Value Date/Time   CHOL 157 02/11/2014 0733   TRIG 150.0 (H) 02/11/2014 0733   HDL 36.30 (L) 02/11/2014 0733   CHOLHDL 4 02/11/2014 0733   VLDL 30.0 02/11/2014 0733   LDLCALC 91 02/11/2014 0733   LDLDIRECT 221.7 05/23/2007 0000       Other studies Reviewed: Additional studies/ records that were reviewed today include:  Echo 01/18/17 Study Conclusions  - Left ventricle: The cavity size was normal. There was mild focal   basal hypertrophy of the septum. Systolic function was moderately   reduced. The estimated ejection fraction was in the range of 35%   to 40%. There is akinesis of the inferolateral myocardium.   Doppler parameters are consistent with abnormal left ventricular   relaxation (grade 1 diastolic dysfunction). - Mitral valve: Calcified annulus. There was mild regurgitation. - Left atrium: The atrium was mildly dilated. - Atrial septum: There was an atrial septal aneurysm.  Impressions:  - Limited study with definity; full doppler not performed; akinesis  of the inferolateral wall with overall moderately reduced LV    systolic function; mild diastolic dysfunction; mild MR; mild LAE.  ECHO 12/2016  Study Conclusions  - Left ventricle: The cavity size was mildly dilated. There was   mild focal basal hypertrophy of the septum. Systolic function was   mildly to moderately reduced. The estimated ejection fraction was   in the range of 40% to 45%. Mild diffuse hypokinesis. There was   fusion of early and atrial contributions to ventricular filling.   The study is not technically sufficient to allow evaluation of LV   diastolic function. - Left atrium: The atrium was mildly dilated. - Right ventricle: The cavity size was mildly dilated. Wall   thickness was normal. - Recommendations: Recommend limited repeat study with definity   contrast to assess wall motion and more accurate assessment of   EF.  Recommendations:  Recommend limited repeat study with definity contrast to assess wall motion and more accurate assessment of EF.   NUC study  06/30/16 study Highlights     The left ventricular ejection fraction is moderately decreased (30-44%).  Nuclear stress EF: 40%.  There was no ST segment deviation noted during stress.  No T wave inversion was noted during stress.  Defect 1: There is a large defect of severe severity present in the basal inferolateral, basal anterolateral, mid inferolateral and mid anterolateral location.  Findings consistent with prior myocardial infarction.  This is an intermediate risk study.   Intermediate risk stress nuclear study with a large scar in the left circumflex coronary artery distribution moderate reduction in left ventricular systolic function.     ASSESSMENT AND PLAN:    1.  CRF with Cr at 1.86 stable f/u renal    2.  Hypotension resolved back on CHF meds improved   3.   CAD s/p CABG    DES to native LAD 04/03/17 DAT   4.  Interstitial lung disease, stable f/u pulmonary   5.  Diffuse Large B cell lymphoma treated and in remission. Has appt with  oncology in January   6.  HLD stable.  Lab Results  Component Value Date   LDLCALC 91 02/11/2014   7. Edema:  Arthritis right knee post vein stripping check RLE duplex r/o DVT   F/U with me 6 months   Current medicines are reviewed with the patient today.  The patient Has no concerns regarding medicines.  Jenkins Rouge

## 2017-10-19 DIAGNOSIS — N057 Unspecified nephritic syndrome with diffuse crescentic glomerulonephritis: Secondary | ICD-10-CM | POA: Diagnosis not present

## 2017-10-19 DIAGNOSIS — I129 Hypertensive chronic kidney disease with stage 1 through stage 4 chronic kidney disease, or unspecified chronic kidney disease: Secondary | ICD-10-CM | POA: Diagnosis not present

## 2017-10-19 DIAGNOSIS — E7800 Pure hypercholesterolemia, unspecified: Secondary | ICD-10-CM | POA: Diagnosis not present

## 2017-10-19 DIAGNOSIS — N058 Unspecified nephritic syndrome with other morphologic changes: Secondary | ICD-10-CM | POA: Diagnosis not present

## 2017-10-19 DIAGNOSIS — R05 Cough: Secondary | ICD-10-CM | POA: Diagnosis not present

## 2017-10-19 DIAGNOSIS — E1129 Type 2 diabetes mellitus with other diabetic kidney complication: Secondary | ICD-10-CM | POA: Diagnosis not present

## 2017-10-19 DIAGNOSIS — C8330 Diffuse large B-cell lymphoma, unspecified site: Secondary | ICD-10-CM | POA: Diagnosis not present

## 2017-10-19 DIAGNOSIS — R109 Unspecified abdominal pain: Secondary | ICD-10-CM | POA: Diagnosis not present

## 2017-10-19 DIAGNOSIS — N183 Chronic kidney disease, stage 3 (moderate): Secondary | ICD-10-CM | POA: Diagnosis not present

## 2017-10-19 DIAGNOSIS — I2510 Atherosclerotic heart disease of native coronary artery without angina pectoris: Secondary | ICD-10-CM | POA: Diagnosis not present

## 2017-10-19 DIAGNOSIS — R809 Proteinuria, unspecified: Secondary | ICD-10-CM | POA: Diagnosis not present

## 2017-10-19 DIAGNOSIS — E1122 Type 2 diabetes mellitus with diabetic chronic kidney disease: Secondary | ICD-10-CM | POA: Diagnosis not present

## 2017-10-19 DIAGNOSIS — N400 Enlarged prostate without lower urinary tract symptoms: Secondary | ICD-10-CM | POA: Diagnosis not present

## 2017-11-08 ENCOUNTER — Other Ambulatory Visit: Payer: Self-pay | Admitting: Cardiovascular Disease

## 2017-11-08 ENCOUNTER — Other Ambulatory Visit: Payer: Self-pay | Admitting: Cardiology

## 2017-11-09 ENCOUNTER — Encounter (INDEPENDENT_AMBULATORY_CARE_PROVIDER_SITE_OTHER): Payer: Self-pay | Admitting: Radiology

## 2017-11-09 ENCOUNTER — Ambulatory Visit (INDEPENDENT_AMBULATORY_CARE_PROVIDER_SITE_OTHER): Payer: Medicare Other | Admitting: Orthopaedic Surgery

## 2017-11-09 DIAGNOSIS — M1711 Unilateral primary osteoarthritis, right knee: Secondary | ICD-10-CM | POA: Diagnosis not present

## 2017-11-09 MED ORDER — HYALURONAN 88 MG/4ML IX SOSY
88.0000 mg | PREFILLED_SYRINGE | INTRA_ARTICULAR | Status: AC | PRN
  Administered 2017-11-09: 88 mg via INTRA_ARTICULAR

## 2017-11-09 NOTE — Progress Notes (Signed)
   Procedure Note  Patient: Luke Mcbride             Date of Birth: 1935-07-04           MRN: [id number]             Visit Date: 11/09/2017  Procedures: Visit Diagnoses: Unilateral primary osteoarthritis, right knee  Large Joint Inj: R knee on 11/09/2017 2:06 PM Indications: pain Details: 22 G needle  Arthrogram: No  Medications: 88 mg Hyaluronan 88 MG/4ML Outcome: tolerated well, no immediate complications Patient was prepped and draped in the usual sterile fashion.

## 2017-11-09 NOTE — Progress Notes (Signed)
Monovisc covered, both ins should cover all costs.  Buy and bill allowed, patient here 11/09/17 for Monvisc.

## 2017-12-09 DIAGNOSIS — X32XXXD Exposure to sunlight, subsequent encounter: Secondary | ICD-10-CM | POA: Diagnosis not present

## 2017-12-09 DIAGNOSIS — L570 Actinic keratosis: Secondary | ICD-10-CM | POA: Diagnosis not present

## 2017-12-09 DIAGNOSIS — D044 Carcinoma in situ of skin of scalp and neck: Secondary | ICD-10-CM | POA: Diagnosis not present

## 2017-12-27 NOTE — Progress Notes (Signed)
HEMATOLOGY/ONCOLOGY CLINIC NOTE  Date of Service: 12/28/17  Patient Care Team: Donato Heinz, MD as PCP - General (Nephrology) [name], MD as PCP - Cardiology (Cardiology) Brunetta Genera, MD as Consulting Physician (Hematology)  CHIEF COMPLAINTS/PURPOSE OF CONSULTATION:  F/u for Diffuse large B cell lymphoma of the small intestine  HISTORY OF PRESENTING ILLNESS:   Luke Mcbride is a wonderful 82 y.o. male who has been referred to [place] by Dr .Donato Heinz, MD and Mauricio Po MD for evaluation and management of newly diagnosed diffuse large B-cell lymphoma of the small bowel.  Patient has a history of multiple medical comorbidities as noted below including coronary artery disease status post PCI and CABG, chronic kidney disease stage III, Barrett's esophagus, hypertension, dyslipidemia, interstitial lung disease, GERD, vasculitis involving the lungs and kidneys on chronic Imuran therapy.  He was having upper abdominal discomfort for a few months with loss of about 15-20 pounds. He had a CT scan of the abdomen done by Dr. Havery Moros on 06/15/2016 which showed a 3.3 x 3.2 x 2.4 cm indeterminate lesion involving the left upper quadrant small bowel loops. No additional small bowel lesions noted. No ascites. No retroperitoneal mesenteric or pelvic lymphadenopathy noted.  Patient was seen by Dr. Serita Grammes and underwent a diagnostic laparoscopy with small bowel resection on 07/12/2016. Pathology showed that the lesion was consistent with diffuse large B-cell lymphoma 2 cm in length and 4 cm in width centrally ulcerated mass involving 100% of the bowel circumference. The tumor was about 1 cm thick invading through the entire wall and into the underlying mesenteric fat. The tumor also abuts and focally involve the serosa. No extramural satellite lymph nodes noted. Did not seem to involve the peri-intestinal lymph nodes. Tumor was noted to have a germinal center  phenotype.  Patient is healing well from surgery. Continues to be on Imuran 100 mg by mouth daily. Has not reported any other peripheral enlarged lymph nodes. He is starting to eat better.   INTERVAL HISTORY   Luke Mcbride is here for follow-up for his Diffuse large B cell lymphoma of the small intestine. The patient's last visit with [place] was on 09/27/17. He is accompanied today by his wife. The pt reports that he is doing well overall.   The pt reports that he has felt mildly more fatigued in the interim , but denies any fevers, chills, night sweats, or unexpected weight loss.  He notes that he doesn't have a scheduled follow up with his nephrologist. He notes that he urinates frequently but denies any discomfort or pain when urinating. He continues on Lasix and denies any leg swelling.   Lab results today (12/28/17) of CBC w/diff, CMP, and Reticulocytes is as follows: all values are WNL except for RBC at 3.47, HGB at 11.0, HCT at 34.4, MCV at 99.1, RDW at 15.3, CO2 at 18, BUN at 53, Creatinine at 3.18, Calcium at 8.5, Albumin at 3.4, AST at 11, GFR at 17. LDH 12/28/17 is WNL at 181  On review of systems, pt reports feeling tired, frequent urination, and denies CP, difficulty passing urine, pain along the spine, leg swelling, and any other symptoms.    MEDICAL HISTORY:  Past Medical History:  Diagnosis Date  . ALLERGIC RHINITIS   . Anemia    hx  . Barrett's esophagus   . BOOP (bronchiolitis obliterans with organizing pneumonia) ([place])    a. s/p R VATS 2008.  [name] CAD (coronary artery disease)  a. 05/2000: NSTEMI/CABG x 6: LIMA->LAD, VG->D1, VG->OM1->2, VG->PDA->RPL;  b. 07/2007 MV: high lat infarct, no ischemia, EF 47%;  c. Cath/PCI: LM nl, LAD20p, 6m, D1 nl, D2 60-70ost, D3 nl, LCX 70ost, 149m, OM1/OM2 min irregs, RCA 30 diff, PDA 99, LIMA->LAD atretic, VG->D1 100, VG->OM1->2 100, VG->PDA->RPL 90p (4.0x23 Vision BMS);  c. 07/2012 Echo: EF 55%, gr1 DD.  [name] Cataract   . CKD (chronic kidney  disease), stage III ([place])    a. renal bx 2008: GLN with vasculitis  . Diffuse large B cell lymphoma ([place]) 08/07/2016  . GERD (gastroesophageal reflux disease)   . Glaucoma    a. Cannot see out of L eye.  . Hematuria    Microscopic  . Hyperglycemia    Patient reported while on prednisone, had to take insulin  . Hyperlipemia   . Hypertension   . Hypothyroidism   . ILD (interstitial lung disease) ([place])   . Iritis   . Local infection of skin and subcutaneous tissue   . Membranoproliferative nephritis   . Myocardial infarction ([place]) 2002  . Neutropenia, drug-induced ([place])   . Recurrent boils   . Residual foreign body in soft tissue   . Vasculitis ([place])    a.  pauciimmune vasculitis with renal involvement and hx of transient hemoptysis in the past with associated BOOP, 2008 (renal bx 2008: GLN with vasculitis). b. History of treatment with 2 cycles of Cytoxan and pheresis. H/o hemoptysis and pulm hemorrhage with 2nd cycle of cytoxan.    SURGICAL HISTORY: Past Surgical History:  Procedure Laterality Date  . BOWEL RESECTION N/A 07/12/2016   Procedure: SMALL BOWEL RESECTION;  Surgeon: Rolm Bookbinder, MD;  Location: [facility];  Service: General;  Laterality: N/A;  . CATARACT EXTRACTION Bilateral   . CORONARY ARTERY BYPASS GRAFT  2002  . CORONARY STENT INTERVENTION N/A 03/31/2017   Procedure: CORONARY STENT INTERVENTION;  Surgeon: Martinique, Peter M, MD;  Location: [facility];  Service: Cardiovascular;  Laterality: N/A;  . IR FLUORO GUIDE PORT INSERTION RIGHT  08/20/2016  . IR REMOVAL TUN ACCESS W/ PORT W/O FL MOD SED  01/20/2017  . IR US GUIDE VASC ACCESS RIGHT  08/20/2016  . LAPAROSCOPY N/A 07/12/2016   Procedure: LAPAROSCOPY DIAGNOSTIC;  Surgeon: Rolm Bookbinder, MD;  Location: [facility];  Service: General;  Laterality: N/A;  . LEFT HEART CATHETERIZATION WITH CORONARY/GRAFT ANGIOGRAM N/A 07/26/2012   Procedure: LEFT HEART CATHETERIZATION WITH Beatrix Fetters;  Surgeon: Wellington Hampshire, MD;  Location: [facility];  Service: Cardiovascular;  Laterality: N/A;  . LUNG BIOPSY    . PERCUTANEOUS CORONARY STENT INTERVENTION (PCI-S)  07/26/2012   Procedure: PERCUTANEOUS CORONARY STENT INTERVENTION (PCI-S);  Surgeon: Wellington Hampshire, MD;  Location: [facility] C[facility];  Service: Cardiovascular;;  . RENAL BIOPSY    . RIGHT/LEFT HEART CATH AND CORONARY/GRAFT ANGIOGRAPHY N/A 03/23/2017   Procedure: RIGHT/LEFT HEART CATH AND CORONARY/GRAFT ANGIOGRAPHY;  Surgeon: Martinique, Peter M, MD;  Location: [facility];  Service: Cardiovascular;  Laterality: N/A;  . TOE AMPUTATION  2009   hammer toe    SOCIAL HISTORY: Social History   Socioeconomic History  . Marital status: Married    Spouse name: Not on file  . Number of children: Not on file  . Years of education: Not on file  . Highest education level: Not on file  Occupational History  . Occupation: [employer]  . Financial resource strain: Not on file  . Food insecurity:  Worry: Not on file    Inability: Not on file  . Transportation needs:    Medical: Not on file    Non-medical: Not on file  Tobacco Use  . Smoking status: Never Smoker  . Smokeless tobacco: Never Used  Substance and Sexual Activity  . Alcohol use: No  . Drug use: No  . Sexual activity: Not on file  Lifestyle  . Physical activity:    Days per week: Not on file    Minutes per session: Not on file  . Stress: Not on file  Relationships  . Social connections:    Talks on phone: Not on file    Gets together: Not on file    Attends religious service: Not on file    Active member of club or organization: Not on file    Attends meetings of clubs or organizations: Not on file    Relationship status: Not on file  . Intimate partner violence:    Fear of current or ex partner: Not on file    Emotionally abused: Not on file    Physically abused: Not on file    Forced sexual activity: Not on file  Other Topics Concern  . Not on file  Social  History Narrative   Regular exercise - yes      [facility] (07/12/16):   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  no pets currently. No bird or mold exposure.    FAMILY HISTORY: Family History  Problem Relation Age of Onset  . Heart disease Mother   . Diabetes Mother   . Prostate cancer Father   . Depression Other   . Diabetes Other   . Prostate cancer Other   . Colon polyps Neg Hx   . Colon cancer Neg Hx   . Rectal cancer Neg Hx   . Stomach cancer Neg Hx   . Esophageal cancer Neg Hx     ALLERGIES:  has No Known Allergies.  MEDICATIONS:  Current Outpatient Medications  Medication Sig Dispense Refill  . acetaminophen (TYLENOL) 500 MG tablet Take 500-1,000 mg by mouth daily as needed for moderate pain or headache.    [name] aspirin EC 81 MG EC tablet Take 1 tablet (81 mg total) by mouth daily. 30 tablet   . clopidogrel (PLAVIX) 75 MG tablet TAKE 1 TABLET BY MOUTH  DAILY 90 tablet 3  . diclofenac sodium (VOLTAREN) 1 % GEL Apply 2 g topically 4 (four) times daily. 1 Tube 11  . dorzolamide-timolol (COSOPT) 22.3-6.8 MG/ML ophthalmic solution Place 1 drop into both eyes 2 (two) times daily.     . finasteride (PROSCAR) 5 MG tablet Take 5 mg by mouth at bedtime.     . furosemide (LASIX) 20 MG tablet TAKE 1 TABLET BY MOUTH  DAILY 90 tablet 2  . gabapentin (NEURONTIN) 100 MG capsule Take 100 mg by mouth 2 (two) times daily.    . isosorbide-hydrALAZINE (BIDIL) 20-37.5 MG tablet Take 1 tablet by mouth 2 (two) times daily. 180 tablet 1  . levothyroxine (SYNTHROID, LEVOTHROID) 100 MCG tablet Take 100 mcg by mouth daily before breakfast.     . metoprolol succinate (TOPROL XL) 25 MG 24 hr tablet Take 1 tablet (25 mg total) by mouth daily. 90 tablet 1  . Multiple Vitamin (MULTIVITAMIN) tablet Take 1 tablet by mouth daily.      . nitroGLYCERIN (NITROSTAT) 0.4 MG SL tablet DISSOLVE 1 TABLET UNDER THE TONGUE EVERY 5 MINUTES AS  NEEDED  FOR CHEST PAIN.  DO  NOT EXCEED A TOTAL  OF 3  DOSES IN 15 MINUTES. 100 tablet 0  . pantoprazole (PROTONIX) 40 MG tablet TAKE 1 TABLET BY MOUTH  DAILY 90 tablet 3  . rosuvastatin (CRESTOR) 10 MG tablet Take 10 mg every morning by mouth.    . Tamsulosin HCl (FLOMAX) 0.4 MG CAPS Take 0.4 mg by mouth at bedtime.     [name] telmisartan (MICARDIS) 20 MG tablet Take 1 tablet (20 mg total) daily by mouth. 30 tablet 6   No current facility-administered medications for this visit.    Facility-Administered Medications Ordered in Other Visits  Medication Dose Route Frequency Provider Last Rate Last Dose  . sodium chloride flush (NS) 0.9 % injection 10 mL  10 mL Intracatheter PRN Brunetta Genera, MD   10 mL at 08/24/16 1653  . sodium chloride flush (NS) 0.9 % injection 10 mL  10 mL Intracatheter PRN Brunetta Genera, MD   10 mL at 10/07/16 1555    REVIEW OF SYSTEMS:   A 10+ POINT REVIEW OF SYSTEMS WAS OBTAINED including neurology, dermatology, psychiatry, cardiac, respiratory, lymph, extremities, GI, GU, Musculoskeletal, constitutional, breasts, reproductive, HEENT.  All pertinent positives are noted in the HPI.  All others are negative.   PHYSICAL EXAMINATION: ECOG PERFORMANCE STATUS: 2 - Symptomatic, <50% confined to bed  Vitals:   12/28/17 1027  BP: 102/64  Pulse: 93  Resp: 20  Temp: 98.4 F (36.9 C)  SpO2: 96%   Filed Weights   12/28/17 1027  Weight: 235 lb 4.8 oz (106.7 kg)   .Body mass index is 31.04 kg/m.  GENERAL:alert, in no acute distress and comfortable SKIN: no acute rashes, no significant lesions EYES: conjunctiva are pink and non-injected, sclera anicteric OROPHARYNX: MMM, no exudates, no oropharyngeal erythema or ulceration NECK: supple, no JVD LYMPH:  no palpable lymphadenopathy in the cervical, axillary or inguinal regions LUNGS: clear to auscultation b/l with normal respiratory effort HEART: regular rate & rhythm ABDOMEN:  normoactive bowel sounds , non tender, not distended. No palpable  hepatosplenomegaly.  Extremity: 1+ pitting pedal edema PSYCH: alert & oriented x 3 with fluent speech NEURO: no focal motor/sensory deficits    LABORATORY DATA:  I have reviewed the data as listed . CBC Latest Ref Rng & Units 12/28/2017 09/27/2017 06/06/2017  WBC 4.0 - 10.3 K/uL 7.4 6.3 5.8  Hemoglobin 13.0 - 17.1 g/dL 11.0(L) 12.1(L) 11.5(L)  Hematocrit 38.4 - 49.9 % 34.4(L) 37.6(L) 36.5(L)  Platelets 140 - 400 K/uL 194 169 164   . CMP Latest Ref Rng & Units 12/28/2017 09/27/2017 06/06/2017  Glucose 70 - 99 mg/dL 97 128 109  BUN 8 - 23 mg/dL 53(H) 49(H) 37(H)  Creatinine 0.61 - 1.24 mg/dL 3.18(HH) 2.41(H) 2.24(H)  Sodium 135 - 145 mmol/L 141 139 138  Potassium 3.5 - 5.1 mmol/L 4.9 4.9 5.3(H)  Chloride 98 - 111 mmol/L 110 108 107  CO2 22 - 32 mmol/L 18(L) 24 23  Calcium 8.9 - 10.3 mg/dL 8.5(L) 8.8 8.9  Total Protein 6.5 - 8.1 g/dL 6.9 6.8 6.9  Total Bilirubin 0.3 - 1.2 mg/dL 0.6 0.4 0.4  Alkaline Phos 38 - 126 U/L 55 61 69  AST 15 - 41 U/L 11(L) 15 17  ALT 0 - 44 U/L 13 17 17     Lab Results  Component Value Date   LDH 181 12/28/2017     RADIOGRAPHIC STUDIES: I have personally reviewed the radiological images as listed and agreed with the  findings in the report.  Nm Pet Image Restag (ps) Skull Base To Thigh  Result Date: 10/22/2016 CLINICAL DATA:  Subsequent treatment strategy for diffuse large B-cell non-Hodgkin's lymphoma. EXAM: NUCLEAR MEDICINE PET SKULL BASE TO THIGH TECHNIQUE: 11.15 mCi F-18 FDG was injected intravenously. Full-ring PET imaging was performed from the skull base to thigh after the radiotracer. CT data was obtained and used for attenuation correction and anatomic localization. FASTING BLOOD GLUCOSE:  Value: 102 mg/dl COMPARISON:  None. FINDINGS: NECK No hypermetabolic lymph nodes in the neck. CHEST The FDG avid right axillary lymph nodes and epicardial lymph nodes have resolved in the interval. The focal hypermetabolic activity in the left ventricular myocardium  described on the previous study is unchanged. No new abnormalities in the chest. ABDOMEN/PELVIS Significant improvement in the abdomen. No definitive FDG avid disease remains in the liver or spleen. The adenopathy in the abdomen has resolved. Minimal uptake in the normal appearing left adrenal gland is of no significance. No abnormal uptake seen in the soft tissues of the pelvis. The omental nodularity seen previously has resolved. There is increased attenuation in the fat associated with the anterior ventral hernia with low level uptake, likely inflammatory. The maximum SUV is measured on image 161 is 3.7. SKELETON There has been significant overall improvement in the bones as well. Uptake in the anterior left acetabulum has significantly diminished with a maximum SUV of 3.4 today versus 29.4 previously. The left iliac uptake seen previously is no longer significantly FDG avid. There is mild focal uptake in the posterior left iliac bone on series 4, image 166 which was not seen previously and demonstrates a maximum SUV of 4.26 today. No other bony abnormalities are identified. IMPRESSION: 1. Significant interval improvement. No FDG avid disease remains in the soft tissues of the neck, chest, abdomen, or pelvis. Very mild uptake remains in the known left anterior acetabular lesion. Mild new uptake in the posterior left iliac bone is nonspecific but could represent mild involvement. Recommend attention on follow-up. 2. Increased attenuation and mild uptake in the fat of a lower midline ventral hernia is likely inflammatory. Recommend attention on follow-up. 3. No other interval change. Electronically Signed   By: Dorise Bullion III M.D   On: 10/22/2016 15:33   PET/CT 08/06/2016: IMPRESSION: 1. In addition to hypermetabolic nodal involvement of the right axilla, pericardial space, porta hepatis/mesenteric root, and retroperitoneum, there is extensive involvement of the liver as well as peritoneal involvement  along the liver surface, splenic surface, and upper omentum. 2. Hypermetabolic bony lesions in the left pelvis compatible with malignant involvement. 3. There is a focus of hypermetabolic activity along the anterolateral wall of the left ventricle. Normally such findings turn out to simply be hypermetabolic myocardium. Strictly speaking given how focal this appears I cannot completely exclude the possibility of tumor involvement within or along the myocardium.   Electronically Signed   By: Van Clines M.D.   On: 08/06/2016 12:48   PET/CT 12/29/2016  IMPRESSION: 1. No evidence of residual or recurrent hypermetabolic lymphoma. 2.  Aortic Atherosclerosis (ICD10-I70.0). 3. Dilatation of the infrarenal aorta and pelvic vasculature, as detailed above.   Electronically Signed   By: Abigail Miyamoto M.D.   On: 12/29/2016 15:17   ASSESSMENT & PLAN:   82 y.o.  male with multiple medical co-morbidities including cardiac co-morbidities, IPF, vascular on chronic immunosupression with    1) Stage IV Diffuse large B-cell lymphoma involving the small intestine (germinal cell phenotype) with complete thickness involvement  of bowel and abutting serosa.  Initial PET/CT scan showed extensive involvement with DLBCL, hypermetabolic nodal involvement of the right axilla, pericardial space, porta hepatis/mesenteric root, and retroperitoneum, there is extensive involvement of the liver as well as peritoneal involvement along the liver surface, splenic surface, and upper omentum. 2. Hypermetabolic bony lesions in the left pelvis compatible with malignant involvement. 3. There is a focus of hypermetabolic activity along the anterolateral wall of the left ventricle.   s/p 6 cycles of R-CEOP .   PET/CT scan on 10/22/2016 after 3 cycles of chemotherapy shows excellent response to treatment. PET/CT scan on 12/29/2016 after 6 cycles of treatment shows No evidence of residual or recurrent hypermetabolic  lymphoma  A)Sh/oneutropenic fever and E.coli/pseudomonal bacteremia/HCAP after the first cycle of treatment.  Significant chemotherapy related thrombocytopenia - now resolved. B) h/o hospitalization for perforation diverticulitis - no issues currently  2) Chronic kidney disease stage III baseline creatinine about 2 to 2.5.  3) Coronary disease status post PCI and CABG - had a myocardial perfusion imaging scan on 06/30/2016 that showed a moderately decreased ejection fraction of 30-44%- following with cardiology for management. Was having some SOB/DOE and had cardiac cath with PCI to mid LAD on11/15/2018. 4) History of pulmonary and renal vasculitis previously on chronic Imuran. Now off Imuran considering chemotherapy and Rituxan use. 5) Neutropenia -resolved  6) Thrombocytopenia due to chemotherapy -resolved with lymphocyte count at 0.8 7) Anemia - much improved hgb is up to 12.1   PLAN:  - -Discussed pt labwork today, 12/28/17; HGB at 11.0, blood counts otherwise stable, Creatinine increased to 3.18 and Calcium at 8.5. LDH normal at 181 -Bump in creatinine due to Obstructive component vs over diuresis with Lasix, or vasculitis ? -Recommended that the pt hold Lasix  -Recommended that the pt return to care with his nephrologist Dr. Servando Salina and asked pt to call his office today -Continue to hold NSAIDs -The pt shows no clinical or lab progression/return of DLBCL at this time.  -No indication for further treatment at this time.    -Will see pt back in 3 months   -His BP is low at 98/61 today, I encouraged him to watch his weight for water retention while on Fluid pill. He should continue to closely be followed by his Cardiologist for any adjustments.  -patient had no clinical or labs evidence of lymphoma recurrence at this time -no indication for additional lymphoma treatment at this time. -Recommended continued f/u with dermatology for screening/management of his recurrent cutaneous  SCC  8) Arthritis, especially in right knee -Week of 09/18/17 he had fluid drawn form right knee and given cortisone shot by PCP -Encouraged him to be active with walking or PT            RTC with Dr Irene Limbo in 3 months with labs Plz call Dr Meredeth Ide today regarding recommendations about a bump in your kidney numbers.    All of the patients questions were answered with apparent satisfaction. The patient knows to call the clinic with any problems, questions or concerns.   The total time spent in the appt was 25 minutes and more than 50% was on counseling and direct patient cares.    Sullivan Lone MD MS AAHIVMS [facility] Hematology/Oncology Physician [facility]  (Office):       [phone] (Work cell):  [phone] (Fax):           [phone]  I, Baldwin Jamaica, am acting as a scribe for Dr. Irene Limbo  .  I have reviewed the above documentation for accuracy and completeness, and I agree with the above. Brunetta Genera MD

## 2017-12-28 ENCOUNTER — Telehealth: Payer: Self-pay

## 2017-12-28 ENCOUNTER — Inpatient Hospital Stay: Payer: Medicare Other

## 2017-12-28 ENCOUNTER — Inpatient Hospital Stay: Payer: Medicare Other | Attending: Hematology | Admitting: Hematology

## 2017-12-28 VITALS — BP 102/64 | HR 93 | Temp 98.4°F | Resp 20 | Ht 73.0 in | Wt 235.3 lb

## 2017-12-28 DIAGNOSIS — N183 Chronic kidney disease, stage 3 (moderate): Secondary | ICD-10-CM | POA: Insufficient documentation

## 2017-12-28 DIAGNOSIS — Z79899 Other long term (current) drug therapy: Secondary | ICD-10-CM | POA: Insufficient documentation

## 2017-12-28 DIAGNOSIS — Z7982 Long term (current) use of aspirin: Secondary | ICD-10-CM | POA: Diagnosis not present

## 2017-12-28 DIAGNOSIS — C8339 Diffuse large B-cell lymphoma, extranodal and solid organ sites: Secondary | ICD-10-CM | POA: Insufficient documentation

## 2017-12-28 DIAGNOSIS — D649 Anemia, unspecified: Secondary | ICD-10-CM | POA: Insufficient documentation

## 2017-12-28 DIAGNOSIS — Z8042 Family history of malignant neoplasm of prostate: Secondary | ICD-10-CM | POA: Insufficient documentation

## 2017-12-28 DIAGNOSIS — R7989 Other specified abnormal findings of blood chemistry: Secondary | ICD-10-CM

## 2017-12-28 DIAGNOSIS — C8330 Diffuse large B-cell lymphoma, unspecified site: Secondary | ICD-10-CM

## 2017-12-28 LAB — CMP (CANCER CENTER ONLY)
ALBUMIN: 3.4 g/dL — AB (ref 3.5–5.0)
ALT: 13 U/L (ref 0–44)
AST: 11 U/L — AB (ref 15–41)
Alkaline Phosphatase: 55 U/L (ref 38–126)
Anion gap: 13 (ref 5–15)
BILIRUBIN TOTAL: 0.6 mg/dL (ref 0.3–1.2)
BUN: 53 mg/dL — AB (ref 8–23)
CALCIUM: 8.5 mg/dL — AB (ref 8.9–10.3)
CO2: 18 mmol/L — ABNORMAL LOW (ref 22–32)
CREATININE: 3.18 mg/dL — AB (ref 0.61–1.24)
Chloride: 110 mmol/L (ref 98–111)
GFR, Est AFR Am: 20 mL/min — ABNORMAL LOW (ref >60)
GFR, Estimated: 17 mL/min — ABNORMAL LOW (ref >60)
GLUCOSE: 97 mg/dL (ref 70–99)
Potassium: 4.9 mmol/L (ref 3.5–5.1)
Sodium: 141 mmol/L (ref 135–145)
TOTAL PROTEIN: 6.9 g/dL (ref 6.5–8.1)

## 2017-12-28 LAB — CBC WITH DIFFERENTIAL/PLATELET
BASOS ABS: 0 10*3/uL (ref 0.0–0.1)
BASOS PCT: 0 %
EOS PCT: 3 %
Eosinophils Absolute: 0.2 10*3/uL (ref 0.0–0.5)
HCT: 34.4 % — ABNORMAL LOW (ref 38.4–49.9)
Hemoglobin: 11 g/dL — ABNORMAL LOW (ref 13.0–17.1)
Lymphocytes Relative: 13 %
Lymphs Abs: 0.9 10*3/uL (ref 0.9–3.3)
MCH: 31.7 pg (ref 27.2–33.4)
MCHC: 32 g/dL (ref 32.0–36.0)
MCV: 99.1 fL — ABNORMAL HIGH (ref 79.3–98.0)
MONO ABS: 0.5 10*3/uL (ref 0.1–0.9)
Monocytes Relative: 7 %
Neutro Abs: 5.6 10*3/uL (ref 1.5–6.5)
Neutrophils Relative %: 77 %
PLATELETS: 194 10*3/uL (ref 140–400)
RBC: 3.47 MIL/uL — ABNORMAL LOW (ref 4.20–5.82)
RDW: 15.3 % — AB (ref 11.0–14.6)
WBC: 7.4 10*3/uL (ref 4.0–10.3)

## 2017-12-28 LAB — LACTATE DEHYDROGENASE: LDH: 181 U/L (ref 98–192)

## 2017-12-28 NOTE — Telephone Encounter (Signed)
rijnted avs and calender of upcoming appointment. Per 8/14 los

## 2018-01-05 DIAGNOSIS — N400 Enlarged prostate without lower urinary tract symptoms: Secondary | ICD-10-CM | POA: Diagnosis not present

## 2018-01-05 DIAGNOSIS — R809 Proteinuria, unspecified: Secondary | ICD-10-CM | POA: Diagnosis not present

## 2018-01-05 DIAGNOSIS — E1129 Type 2 diabetes mellitus with other diabetic kidney complication: Secondary | ICD-10-CM | POA: Diagnosis not present

## 2018-01-05 DIAGNOSIS — N179 Acute kidney failure, unspecified: Secondary | ICD-10-CM | POA: Diagnosis not present

## 2018-01-05 DIAGNOSIS — N183 Chronic kidney disease, stage 3 (moderate): Secondary | ICD-10-CM | POA: Diagnosis not present

## 2018-01-05 DIAGNOSIS — I129 Hypertensive chronic kidney disease with stage 1 through stage 4 chronic kidney disease, or unspecified chronic kidney disease: Secondary | ICD-10-CM | POA: Diagnosis not present

## 2018-01-05 DIAGNOSIS — E1122 Type 2 diabetes mellitus with diabetic chronic kidney disease: Secondary | ICD-10-CM | POA: Diagnosis not present

## 2018-01-05 DIAGNOSIS — N057 Unspecified nephritic syndrome with diffuse crescentic glomerulonephritis: Secondary | ICD-10-CM | POA: Diagnosis not present

## 2018-01-05 DIAGNOSIS — C8330 Diffuse large B-cell lymphoma, unspecified site: Secondary | ICD-10-CM | POA: Diagnosis not present

## 2018-01-05 DIAGNOSIS — E7800 Pure hypercholesterolemia, unspecified: Secondary | ICD-10-CM | POA: Diagnosis not present

## 2018-01-05 DIAGNOSIS — I2510 Atherosclerotic heart disease of native coronary artery without angina pectoris: Secondary | ICD-10-CM | POA: Diagnosis not present

## 2018-01-05 DIAGNOSIS — N058 Unspecified nephritic syndrome with other morphologic changes: Secondary | ICD-10-CM | POA: Diagnosis not present

## 2018-01-05 DIAGNOSIS — E669 Obesity, unspecified: Secondary | ICD-10-CM | POA: Diagnosis not present

## 2018-01-06 ENCOUNTER — Telehealth: Payer: Self-pay | Admitting: Cardiovascular Disease

## 2018-01-06 NOTE — Telephone Encounter (Signed)
New Message:     Per staff msg:   blood pressure very low.90/56.kidney dr.asked that i get appt. asap.thanks,bob Kerce

## 2018-01-06 NOTE — Telephone Encounter (Signed)
Pt called to report that he had a low BP at his appt yesterday with his Nephrologist.. He says the nurse checked it manually and it was 90/50 and does not know his HR. However, he has been checking his BP at home and has been consistently running 130/70 and HR 70. He denies dizziness, presyncope, but is sob with exertion and fatigue mid afternoon every day. He also reports that Nephrology took him off of his Lasix several weeks ago. He has not had any edema problems. He says that they made him feel anxious about his BP and stressed to him to see Dr, [name] soon but pt does not have appt until 02/08/18. I advised him that his BP sounds like it is running good and he feels fairly well, and that he should be sure to be getting enough fluids and rest and to let us know if anything changes. Pt agrees and still requests message be sent to Dr. [name] to see if he is okay with the plan and has any further recommendations.

## 2018-01-06 NOTE — Telephone Encounter (Signed)
Pt advised Dr. [name] ok with plan and will keep appt 02/08/18 with Dr. [name].. Will call if he has any further problems with his BP before then.

## 2018-01-06 NOTE — Telephone Encounter (Signed)
Ok

## 2018-01-11 ENCOUNTER — Ambulatory Visit
Admission: RE | Admit: 2018-01-11 | Discharge: 2018-01-11 | Disposition: A | Discharge status: Home / Self Care | Payer: Medicare Other | Source: Ambulatory Visit | Attending: Nephrology | Admitting: Nephrology

## 2018-01-11 ENCOUNTER — Other Ambulatory Visit: Payer: Self-pay | Admitting: Nephrology

## 2018-01-11 DIAGNOSIS — L570 Actinic keratosis: Secondary | ICD-10-CM | POA: Diagnosis not present

## 2018-01-11 DIAGNOSIS — N057 Unspecified nephritic syndrome with diffuse crescentic glomerulonephritis: Secondary | ICD-10-CM

## 2018-01-11 DIAGNOSIS — Z85828 Personal history of other malignant neoplasm of skin: Secondary | ICD-10-CM | POA: Diagnosis not present

## 2018-01-11 DIAGNOSIS — X32XXXD Exposure to sunlight, subsequent encounter: Secondary | ICD-10-CM | POA: Diagnosis not present

## 2018-01-11 DIAGNOSIS — N058 Unspecified nephritic syndrome with other morphologic changes: Principal | ICD-10-CM

## 2018-01-11 DIAGNOSIS — R0609 Other forms of dyspnea: Secondary | ICD-10-CM | POA: Diagnosis not present

## 2018-01-11 DIAGNOSIS — Z08 Encounter for follow-up examination after completed treatment for malignant neoplasm: Secondary | ICD-10-CM | POA: Diagnosis not present

## 2018-01-12 DIAGNOSIS — I129 Hypertensive chronic kidney disease with stage 1 through stage 4 chronic kidney disease, or unspecified chronic kidney disease: Secondary | ICD-10-CM | POA: Diagnosis not present

## 2018-01-29 ENCOUNTER — Other Ambulatory Visit: Payer: Self-pay

## 2018-01-29 ENCOUNTER — Encounter (HOSPITAL_COMMUNITY): Payer: Self-pay

## 2018-01-29 ENCOUNTER — Emergency Department (HOSPITAL_COMMUNITY): Payer: Medicare Other

## 2018-01-29 ENCOUNTER — Inpatient Hospital Stay (HOSPITAL_COMMUNITY)
Admission: EM | Admit: 2018-01-29 | Discharge: 2018-01-30 | DRG: 291 | Disposition: A | Discharge status: Home / Self Care | Payer: Medicare Other | Attending: Internal Medicine | Admitting: Internal Medicine

## 2018-01-29 DIAGNOSIS — Z89429 Acquired absence of other toe(s), unspecified side: Secondary | ICD-10-CM

## 2018-01-29 DIAGNOSIS — E1165 Type 2 diabetes mellitus with hyperglycemia: Secondary | ICD-10-CM | POA: Diagnosis present

## 2018-01-29 DIAGNOSIS — E785 Hyperlipidemia, unspecified: Secondary | ICD-10-CM | POA: Diagnosis present

## 2018-01-29 DIAGNOSIS — E1136 Type 2 diabetes mellitus with diabetic cataract: Secondary | ICD-10-CM | POA: Diagnosis present

## 2018-01-29 DIAGNOSIS — I2510 Atherosclerotic heart disease of native coronary artery without angina pectoris: Secondary | ICD-10-CM | POA: Diagnosis present

## 2018-01-29 DIAGNOSIS — Z7902 Long term (current) use of antithrombotics/antiplatelets: Secondary | ICD-10-CM

## 2018-01-29 DIAGNOSIS — R0989 Other specified symptoms and signs involving the circulatory and respiratory systems: Secondary | ICD-10-CM

## 2018-01-29 DIAGNOSIS — I5043 Acute on chronic combined systolic (congestive) and diastolic (congestive) heart failure: Secondary | ICD-10-CM | POA: Diagnosis present

## 2018-01-29 DIAGNOSIS — Z9221 Personal history of antineoplastic chemotherapy: Secondary | ICD-10-CM | POA: Diagnosis not present

## 2018-01-29 DIAGNOSIS — Z955 Presence of coronary angioplasty implant and graft: Secondary | ICD-10-CM | POA: Diagnosis not present

## 2018-01-29 DIAGNOSIS — C8330 Diffuse large B-cell lymphoma, unspecified site: Secondary | ICD-10-CM | POA: Diagnosis present

## 2018-01-29 DIAGNOSIS — Z9114 Patient's other noncompliance with medication regimen: Secondary | ICD-10-CM

## 2018-01-29 DIAGNOSIS — R350 Frequency of micturition: Secondary | ICD-10-CM | POA: Diagnosis not present

## 2018-01-29 DIAGNOSIS — H409 Unspecified glaucoma: Secondary | ICD-10-CM | POA: Diagnosis present

## 2018-01-29 DIAGNOSIS — I776 Arteritis, unspecified: Secondary | ICD-10-CM

## 2018-01-29 DIAGNOSIS — N184 Chronic kidney disease, stage 4 (severe): Secondary | ICD-10-CM | POA: Diagnosis present

## 2018-01-29 DIAGNOSIS — E1122 Type 2 diabetes mellitus with diabetic chronic kidney disease: Secondary | ICD-10-CM | POA: Diagnosis present

## 2018-01-29 DIAGNOSIS — L959 Vasculitis limited to the skin, unspecified: Secondary | ICD-10-CM | POA: Diagnosis not present

## 2018-01-29 DIAGNOSIS — G479 Sleep disorder, unspecified: Secondary | ICD-10-CM | POA: Diagnosis not present

## 2018-01-29 DIAGNOSIS — I130 Hypertensive heart and chronic kidney disease with heart failure and stage 1 through stage 4 chronic kidney disease, or unspecified chronic kidney disease: Secondary | ICD-10-CM | POA: Diagnosis present

## 2018-01-29 DIAGNOSIS — Z79890 Hormone replacement therapy (postmenopausal): Secondary | ICD-10-CM

## 2018-01-29 DIAGNOSIS — Z7982 Long term (current) use of aspirin: Secondary | ICD-10-CM

## 2018-01-29 DIAGNOSIS — Z833 Family history of diabetes mellitus: Secondary | ICD-10-CM

## 2018-01-29 DIAGNOSIS — Z79899 Other long term (current) drug therapy: Secondary | ICD-10-CM

## 2018-01-29 DIAGNOSIS — I5040 Unspecified combined systolic (congestive) and diastolic (congestive) heart failure: Secondary | ICD-10-CM | POA: Diagnosis not present

## 2018-01-29 DIAGNOSIS — D638 Anemia in other chronic diseases classified elsewhere: Secondary | ICD-10-CM | POA: Diagnosis present

## 2018-01-29 DIAGNOSIS — Z8249 Family history of ischemic heart disease and other diseases of the circulatory system: Secondary | ICD-10-CM

## 2018-01-29 DIAGNOSIS — R0602 Shortness of breath: Secondary | ICD-10-CM | POA: Diagnosis not present

## 2018-01-29 DIAGNOSIS — N059 Unspecified nephritic syndrome with unspecified morphologic changes: Secondary | ICD-10-CM | POA: Diagnosis present

## 2018-01-29 DIAGNOSIS — E039 Hypothyroidism, unspecified: Secondary | ICD-10-CM | POA: Diagnosis present

## 2018-01-29 DIAGNOSIS — I43 Cardiomyopathy in diseases classified elsewhere: Secondary | ICD-10-CM | POA: Diagnosis not present

## 2018-01-29 DIAGNOSIS — I509 Heart failure, unspecified: Secondary | ICD-10-CM

## 2018-01-29 DIAGNOSIS — I255 Ischemic cardiomyopathy: Secondary | ICD-10-CM

## 2018-01-29 DIAGNOSIS — Z951 Presence of aortocoronary bypass graft: Secondary | ICD-10-CM

## 2018-01-29 DIAGNOSIS — T501X5A Adverse effect of loop [high-ceiling] diuretics, initial encounter: Secondary | ICD-10-CM | POA: Diagnosis not present

## 2018-01-29 DIAGNOSIS — K219 Gastro-esophageal reflux disease without esophagitis: Secondary | ICD-10-CM | POA: Diagnosis present

## 2018-01-29 DIAGNOSIS — R531 Weakness: Secondary | ICD-10-CM

## 2018-01-29 DIAGNOSIS — N183 Chronic kidney disease, stage 3 (moderate): Secondary | ICD-10-CM | POA: Diagnosis not present

## 2018-01-29 DIAGNOSIS — D631 Anemia in chronic kidney disease: Secondary | ICD-10-CM | POA: Diagnosis not present

## 2018-01-29 DIAGNOSIS — I252 Old myocardial infarction: Secondary | ICD-10-CM

## 2018-01-29 DIAGNOSIS — R252 Cramp and spasm: Secondary | ICD-10-CM | POA: Diagnosis not present

## 2018-01-29 DIAGNOSIS — I959 Hypotension, unspecified: Secondary | ICD-10-CM | POA: Diagnosis not present

## 2018-01-29 DIAGNOSIS — R0601 Orthopnea: Secondary | ICD-10-CM | POA: Diagnosis not present

## 2018-01-29 LAB — CBC
HEMATOCRIT: 37.3 % — AB (ref 39.0–52.0)
HEMOGLOBIN: 11.1 g/dL — AB (ref 13.0–17.0)
MCH: 31.4 pg (ref 26.0–34.0)
MCHC: 29.8 g/dL — AB (ref 30.0–36.0)
MCV: 105.4 fL — AB (ref 78.0–100.0)
Platelets: 222 10*3/uL (ref 150–400)
RBC: 3.54 MIL/uL — ABNORMAL LOW (ref 4.22–5.81)
RDW: 14.6 % (ref 11.5–15.5)
WBC: 8.4 10*3/uL (ref 4.0–10.5)

## 2018-01-29 LAB — BASIC METABOLIC PANEL
Anion gap: 10 (ref 5–15)
BUN: 29 mg/dL — ABNORMAL HIGH (ref 8–23)
CHLORIDE: 107 mmol/L (ref 98–111)
CO2: 23 mmol/L (ref 22–32)
Calcium: 9.1 mg/dL (ref 8.9–10.3)
Creatinine, Ser: 2.21 mg/dL — ABNORMAL HIGH (ref 0.61–1.24)
GFR calc Af Amer: 31 mL/min — ABNORMAL LOW (ref >60)
GFR calc non Af Amer: 26 mL/min — ABNORMAL LOW (ref >60)
GLUCOSE: 131 mg/dL — AB (ref 70–99)
POTASSIUM: 4.6 mmol/L (ref 3.5–5.1)
Sodium: 140 mmol/L (ref 135–145)

## 2018-01-29 LAB — TROPONIN I: Troponin I: <0.03 ng/mL (ref <0.03)

## 2018-01-29 LAB — BRAIN NATRIURETIC PEPTIDE: B NATRIURETIC PEPTIDE 5: 427.2 pg/mL — AB (ref 0.0–100.0)

## 2018-01-29 MED ORDER — ACETAMINOPHEN 325 MG PO TABS: 650.0000 mg | ORAL_TABLET | Freq: Four times a day (QID) | ORAL | Status: DC | PRN

## 2018-01-29 MED ORDER — SENNOSIDES-DOCUSATE SODIUM 8.6-50 MG PO TABS: 1.0000 | ORAL_TABLET | Freq: Every evening | ORAL | Status: DC | PRN

## 2018-01-29 MED ORDER — LEVOTHYROXINE SODIUM 100 MCG PO TABS
100.0000 ug | ORAL_TABLET | Freq: Every day | ORAL | Status: DC
  Administered 2018-01-30: 100 ug via ORAL
  Filled 2018-01-29: qty 1

## 2018-01-29 MED ORDER — FINASTERIDE 5 MG PO TABS
5.0000 mg | ORAL_TABLET | Freq: Every day | ORAL | Status: DC
  Administered 2018-01-29: 5 mg via ORAL
  Filled 2018-01-29: qty 1

## 2018-01-29 MED ORDER — ENOXAPARIN SODIUM 40 MG/0.4ML ~~LOC~~ SOLN
40.0000 mg | SUBCUTANEOUS | Status: DC
  Administered 2018-01-29 – 2018-01-30 (×2): 40 mg via SUBCUTANEOUS
  Filled 2018-01-29 (×2): qty 0.4

## 2018-01-29 MED ORDER — ACETAMINOPHEN 650 MG RE SUPP: 650.0000 mg | Freq: Four times a day (QID) | RECTAL | Status: DC | PRN

## 2018-01-29 MED ORDER — DORZOLAMIDE HCL-TIMOLOL MAL 2-0.5 % OP SOLN
1.0000 [drp] | Freq: Two times a day (BID) | OPHTHALMIC | Status: DC
  Administered 2018-01-29 – 2018-01-30 (×2): 1 [drp] via OPHTHALMIC
  Filled 2018-01-29: qty 10

## 2018-01-29 MED ORDER — ROSUVASTATIN CALCIUM 10 MG PO TABS
10.0000 mg | ORAL_TABLET | ORAL | Status: DC
  Administered 2018-01-30: 10 mg via ORAL
  Filled 2018-01-29: qty 1

## 2018-01-29 MED ORDER — PANTOPRAZOLE SODIUM 40 MG PO TBEC
40.0000 mg | DELAYED_RELEASE_TABLET | Freq: Every day | ORAL | Status: DC
  Administered 2018-01-30: 40 mg via ORAL
  Filled 2018-01-29: qty 1

## 2018-01-29 MED ORDER — TAMSULOSIN HCL 0.4 MG PO CAPS
0.4000 mg | ORAL_CAPSULE | Freq: Every day | ORAL | Status: DC
  Administered 2018-01-29: 0.4 mg via ORAL
  Filled 2018-01-29: qty 1

## 2018-01-29 MED ORDER — FUROSEMIDE 10 MG/ML IJ SOLN
40.0000 mg | Freq: Once | INTRAMUSCULAR | Status: AC
  Administered 2018-01-29: 40 mg via INTRAVENOUS
  Filled 2018-01-29: qty 4

## 2018-01-29 MED ORDER — CLOPIDOGREL BISULFATE 75 MG PO TABS
75.0000 mg | ORAL_TABLET | Freq: Every day | ORAL | Status: DC
  Administered 2018-01-30: 75 mg via ORAL
  Filled 2018-01-29: qty 1

## 2018-01-29 MED ORDER — METOPROLOL SUCCINATE ER 25 MG PO TB24
25.0000 mg | ORAL_TABLET | Freq: Every day | ORAL | Status: DC
  Administered 2018-01-29 – 2018-01-30 (×2): 25 mg via ORAL
  Filled 2018-01-29 (×2): qty 1

## 2018-01-29 NOTE — H&P (Signed)
Date: 01/29/2018               Patient Name:  Luke Mcbride MRN: [id number]  DOB: [birth year] Age / Sex: 82 y.o., male   PCP: Donato Heinz, MD         Medical Service: Internal Medicine Teaching Service         Attending Physician: Dr. Lenice Pressman    First Contact: Dr. Eileen Stanford Pager: [phone]  Second Contact: Dr. Trilby Drummer Pager: [phone]       After Hours (After 5p/  First Contact Pager: [phone]  weekends / holidays): Second Contact Pager: [phone]   Chief Complaint: Weakness and shortness of breath  History of Present Illness:   [name] is an 82 year old pleasant gentleman with medical history significant for combined systolic and diastolic heart failure EF 35-40% secondary to ischemic cardiomyopathy, CAD s/p CABG x6 in 2002, PCI in 2014 and 2018, Stage IV Diffuse large B-cell lymphoma in remission s/p chemotherapy and small bowel resection 2018, Pauci Immune pulmonary-renal vasculitis, Type II Diabetes Mellitus, Hypertension, Hyperlipidemia, Chronic Kidney Disease III, hypothyroidism who presented to the ED with shortness of breath and weakness.   He was in his usual state of health until 5 days ago when he began experiencing orthopnea, paroxysmal nocturnal dyspnea and dyspnea on exertion.  He reports that previously he was able to walk 25 yards without shortness of breath but recently he has been unable to.  Also, he stopped taking his Lasix due to increased voiding.  He denies chest pain, palpitation, headaches, dizziness, lightheadedness.  He usually gets up slowly from a seated position.   Per chart review, he last saw his Oncologist Dr. Irene Limbo on 12/28/2016 with complains of faiture. He was found to have elevated creatinine to 3.18 and was asked to hold off on Lasix and follow up with nephrology. Patient today reports that he continued to take lasix but had stopped in the past few days.  He also states that 3 weeks ago he noticed specks of blood in his cough but none  since.  Also endorses decreased appetite but is currently asking for food to eat. He denies fevers, chills, night sweats or weight loss.   ED course: BP 144/83, afebrile, HR 86, BNP elevated at 427, negative troponin, EKG revealed sinus rhythm with PVCs, CXR with no evidence of pulmonary congestion. He received 1 dose of IV Lasix 40mg    Meds:  Current Meds  Medication Sig  . acetaminophen (TYLENOL) 500 MG tablet Take 500 mg by mouth daily as needed for moderate pain or headache.   [name] aspirin EC 81 MG EC tablet Take 1 tablet (81 mg total) by mouth daily.  . clopidogrel (PLAVIX) 75 MG tablet TAKE 1 TABLET BY MOUTH  DAILY (Patient taking differently: Take 75 mg by mouth daily. )  . dorzolamide-timolol (COSOPT) 22.3-6.8 MG/ML ophthalmic solution Place 1 drop into both eyes 2 (two) times daily.   . finasteride (PROSCAR) 5 MG tablet Take 5 mg by mouth at bedtime.   . furosemide (LASIX) 20 MG tablet TAKE 1 TABLET BY MOUTH  DAILY (Patient taking differently: Take 20 mg by mouth daily. )  . gabapentin (NEURONTIN) 100 MG capsule Take 100 mg by mouth 2 (two) times daily.  [name] levothyroxine (SYNTHROID, LEVOTHROID) 100 MCG tablet Take 100 mcg by mouth daily before breakfast.   . metoprolol succinate (TOPROL XL) 25 MG 24 hr tablet Take 1 tablet (25 mg total) by mouth daily.  . Multiple Vitamin (  MULTIVITAMIN) tablet Take 1 tablet by mouth daily.    . nitroGLYCERIN (NITROSTAT) 0.4 MG SL tablet DISSOLVE 1 TABLET UNDER THE TONGUE EVERY 5 MINUTES AS  NEEDED FOR CHEST PAIN.  DO  NOT EXCEED A TOTAL OF 3  DOSES IN 15 MINUTES. (Patient taking differently: Place 0.4 mg under the tongue every 5 (five) minutes as needed for chest pain. )  . pantoprazole (PROTONIX) 40 MG tablet TAKE 1 TABLET BY MOUTH  DAILY (Patient taking differently: Take 40 mg by mouth daily. )  . rosuvastatin (CRESTOR) 10 MG tablet Take 10 mg every morning by mouth.  . Tamsulosin HCl (FLOMAX) 0.4 MG CAPS Take 0.4 mg by mouth at bedtime.       Allergies: Allergies as of 01/29/2018  . (No Known Allergies)   Past Medical History:  Diagnosis Date  . ALLERGIC RHINITIS   . Anemia    hx  . Barrett's esophagus   . BOOP (bronchiolitis obliterans with organizing pneumonia) ([place])    a. s/p R VATS 2008.  [name] CAD (coronary artery disease)    a. 05/2000: NSTEMI/CABG x 6: LIMA->LAD, VG->D1, VG->OM1->2, VG->PDA->RPL;  b. 07/2007 MV: high lat infarct, no ischemia, EF 47%;  c. Cath/PCI: LM nl, LAD20p, 7m, D1 nl, D2 60-70ost, D3 nl, LCX 70ost, 173m, OM1/OM2 min irregs, RCA 30 diff, PDA 99, LIMA->LAD atretic, VG->D1 100, VG->OM1->2 100, VG->PDA->RPL 90p (4.0x23 Vision BMS);  c. 07/2012 Echo: EF 55%, gr1 DD.  [name] Cataract   . CKD (chronic kidney disease), stage III ([place])    a. renal bx 2008: GLN with vasculitis  . Diffuse large B cell lymphoma ([place]) 08/07/2016  . GERD (gastroesophageal reflux disease)   . Glaucoma    a. Cannot see out of L eye.  . Hematuria    Microscopic  . Hyperglycemia    Patient reported while on prednisone, had to take insulin  . Hyperlipemia   . Hypertension   . Hypothyroidism   . ILD (interstitial lung disease) ([place])   . Iritis   . Local infection of skin and subcutaneous tissue   . Membranoproliferative nephritis   . Myocardial infarction ([place]) 2002  . Neutropenia, drug-induced ([place])   . Recurrent boils   . Residual foreign body in soft tissue   . Vasculitis ([place])    a.  pauciimmune vasculitis with renal involvement and hx of transient hemoptysis in the past with associated BOOP, 2008 (renal bx 2008: GLN with vasculitis). b. History of treatment with 2 cycles of Cytoxan and pheresis. H/o hemoptysis and pulm hemorrhage with 2nd cycle of cytoxan.    Family History:  -Mother with heart disease and diabetes -Father with prostate cancer   Social History: Denies EtOH, tobacco or illicit drug use   Review of Systems: A complete ROS was negative except as per HPI.   Physical Exam: Blood pressure (!) 154/88,  pulse 83, temperature 97.9 F (36.6 C), temperature source Oral, resp. rate 20, height 6' (1.829 m), weight 100.2 kg, SpO2 98 %.  Physical Exam  Constitutional: He is well-developed, well-nourished, and in no distress. No distress.  HENT:  Head: Normocephalic and atraumatic.  Eyes: No scleral icterus.  Neck: Neck supple.  Cardiovascular: Normal rate, regular rhythm, normal heart sounds and intact distal pulses. Exam reveals no gallop and no friction rub.  No murmur heard. Pulmonary/Chest: Effort normal. No respiratory distress. He has no wheezes. He has rales (mild bibasilar rales ).  Abdominal: Soft. Bowel sounds are normal. He exhibits no distension. There is  no tenderness.  Neurological: He is alert.  Skin: Skin is warm. He is not diaphoretic.  -Well healed surgical scar on chest fron CABG -Well healed linear scar at RLE  Psychiatric: Mood and affect normal.    EKG: personally reviewed my interpretation is sinus rhythm with PVCs  CXR: personally reviewed my interpretation is unremarkable with no evidence of pulmonary congestion   Assessment & Plan by Problem: Active Problems:   Acute on chronic heart failure [facility])  [name] is an 82 year old pleasant gentleman with medical history significant for combined systolic and diastolic heart failure EF 35-40% secondary to ischemic cardiomyopathy, CAD s/p CABG x6 in 2002, PCI in 2014 and 2018, Stage IV Diffuse large B-cell lymphoma in remission s/p chemotherapy and small bowel resection 2018, Pauci Immune pulmonary-renal vasculitis, Type II Diabetes Mellitus, Hypertension, Hyperlipidemia, Chronic Kidney Disease III, hypothyroidism here with suspected CHF exacerbation   Suspected Acute on chronic combined systolic and diastolic heart failure: Presents with 5 day history of DOE, orthopnea, PND most likely secondary to medication non-compliance. In the ED, BNP elevated at 427, CXR unremarkable. Mild bibasilar rales on lung auscultation  otherwise. His recorded weight is 100.2kg << 106.7kg (1 month ago) <<110.5kg (3 months ago). He exhibits mild CHF exacerbation and will continue -s/p 1 dose of IV Lasix 40mg  -Echocardiogram  -Strict I/Os -Daily weights -Telemetry   CAD: s/p CABG x6 in 2002, PCI in 2014 and 2018 -Continue Plavix 75mg  daily, Crestor 10mg  daily  -Cardiology appointment with Dr. [name] 02/08/2018  Chronic kidney disease III-IV: Stable. Cr.2.21 [Baseline 2.4]  Type II Diabetes Mellitus: Well controlled, on no medication  -Monitor CBGs  Hypertension: Per chart review, there have been episodes of hypotension with SBP in the 90s. --Home meds: isosorbide-hydralazine, toprol-XL, telmisartan  Stage IV Diffuse large B-cell lymphoma: Currently In remission s/p chemotherapy and small bowel resection 2018. PET/CT scan on 12/29/2016 showed no evidence of "residual or recurrent hypermetabolic lymphoma." Follow up with Dr. Irene Limbo  -Oncology appointment with Dr. Irene Limbo: 03/29/18  Pauci Immune pulmonary-renal vasculitis: Follows up with [facility] and Per chart review he has Pauci-immune glomerulonephritis after undergoing renal biopsy in June 2008.  He received IV Cytoxan with reported good response but however relapsed with symptoms of hemoptysis and pulmonary hemorrhage and subsequently underwent plasmapheresis and another round of Cytoxan. He has since been on Imuran which was briefly held during  chemotherapy in 2018. Reports that he takes Imuran 100mg  am and 50mg  pm -Follows up with Dr. Victorino Sparrow and last saw him 01/05/2018  Hypothyroidism: Continue synthroid 100 mcg daily   Anemia of chronic disease: Hgb stable at 11.1   FEN: No fluids, replace electrolytes as needed, heart healthy diet  VTE ppx: Lovenox SQ Code status: Full Code   Dispo: Admit patient to inpatient with expected length of stay greater than 2 midnights   Signed: Jean Rosenthal, MD 01/29/2018, 6:27 PM  Pager: [phone] IMTS PGY-1

## 2018-01-29 NOTE — ED Provider Notes (Signed)
[facility] EMERGENCY DEPARTMENT Provider Note   CSN: [id number] Arrival date & time: 01/29/18  1101     History   Chief Complaint Chief Complaint  Patient presents with  . Shortness of Breath  . Weakness    HPI Luke Mcbride is a 82 y.o. male.  This is a 82 year old male who presents with weakness and increased shortness of breath times several days.  He has not had any cough or congestion.  No chest pain.  No fever or chills.  No nausea vomiting or diarrhea.  Weakness has been persistent along with his dyspnea that is worse with activity and better with rest.  He endorses orthopnea as well as lower extremity edema.  No syncope or near syncope.  Has been compliant with his medications.  Does have a prior history of CKD as well as CAD.     Past Medical History:  Diagnosis Date  . ALLERGIC RHINITIS   . Anemia    hx  . Barrett's esophagus   . BOOP (bronchiolitis obliterans with organizing pneumonia) ([place])    a. s/p R VATS 2008.  [name] CAD (coronary artery disease)    a. 05/2000: NSTEMI/CABG x 6: LIMA->LAD, VG->D1, VG->OM1->2, VG->PDA->RPL;  b. 07/2007 MV: high lat infarct, no ischemia, EF 47%;  c. Cath/PCI: LM nl, LAD20p, 90m, D1 nl, D2 60-70ost, D3 nl, LCX 70ost, 143m, OM1/OM2 min irregs, RCA 30 diff, PDA 99, LIMA->LAD atretic, VG->D1 100, VG->OM1->2 100, VG->PDA->RPL 90p (4.0x23 Vision BMS);  c. 07/2012 Echo: EF 55%, gr1 DD.  [name] Cataract   . CKD (chronic kidney disease), stage III ([place])    a. renal bx 2008: GLN with vasculitis  . Diffuse large B cell lymphoma ([place]) 08/07/2016  . GERD (gastroesophageal reflux disease)   . Glaucoma    a. Cannot see out of L eye.  . Hematuria    Microscopic  . Hyperglycemia    Patient reported while on prednisone, had to take insulin  . Hyperlipemia   . Hypertension   . Hypothyroidism   . ILD (interstitial lung disease) ([place])   . Iritis   . Local infection of skin and subcutaneous tissue   . Membranoproliferative nephritis    . Myocardial infarction ([place]) 2002  . Neutropenia, drug-induced ([place])   . Recurrent boils   . Residual foreign body in soft tissue   . Vasculitis ([place])    a.  pauciimmune vasculitis with renal involvement and hx of transient hemoptysis in the past with associated BOOP, 2008 (renal bx 2008: GLN with vasculitis). b. History of treatment with 2 cycles of Cytoxan and pheresis. H/o hemoptysis and pulm hemorrhage with 2nd cycle of cytoxan.    Patient Active Problem List   Diagnosis Date Noted  . CAD (coronary artery disease) 03/30/2017  . Dyspnea on effort   . CAD (coronary artery disease) of bypass graft 03/23/2017  . Angina pectoris ([place]) 03/23/2017  . Immunosuppressed due to chemotherapy   . Acute diverticulitis 10/30/2016  . Diverticulitis of colon with perforation 10/30/2016  . Counseling regarding advanced care planning and goals of care 09/16/2016  . Port catheter in place 09/15/2016  . Acute lower UTI 09/02/2016  . Pancytopenia ([place]) 09/02/2016  . Neutropenia with fever ([place]) 09/02/2016  . Sepsis ([place]) 09/02/2016  . Chemotherapy-induced neutropenia ([place])   . Diffuse large B cell lymphoma ([place]) 08/07/2016  . S/P small bowel resection 07/12/2016  . Spinal stenosis 07/08/2015  . CKD (chronic kidney disease), stage III ([place]) 07/29/2012  .  NSTEMI (non-ST elevated myocardial infarction) ([place]) 07/29/2012  . Vasculitis ([place])   . ALLERGIC RHINITIS 07/16/2010  . HYPERCHOLESTEROLEMIA 12/04/2008  . ANEMIA 12/04/2008  . IRITIS 12/04/2008  . RENAL INSUFFICIENCY 12/04/2008  . Coronary atherosclerosis 07/23/2008  . GERD 04/15/2008  . Barrett's esophagus 04/15/2008  . HYPERLIPIDEMIA 07/20/2007  . Essential hypertension, benign 05/23/2007  . FOREIGN BODY, SOFT TISSUE, RESIDUAL 04/24/2007  . INTERSTITIAL LUNG DISEASE 02/05/2007  . RESTLESS LEG SYNDROME, SEVERE 01/26/2007  . NEPHRITIS, MEMBRANOPROLIFERATIVE 01/26/2007  . Hypothyroidism 11/30/2006  . DIABETES MELLITUS, TYPE II 11/30/2006    . Myocardial infarction ([place]) 05/17/2000    Past Surgical History:  Procedure Laterality Date  . BOWEL RESECTION N/A 07/12/2016   Procedure: SMALL BOWEL RESECTION;  Surgeon: Rolm Bookbinder, MD;  Location: [facility];  Service: General;  Laterality: N/A;  . CATARACT EXTRACTION Bilateral   . CORONARY ARTERY BYPASS GRAFT  2002  . CORONARY STENT INTERVENTION N/A 03/31/2017   Procedure: CORONARY STENT INTERVENTION;  Surgeon: Martinique, Peter M, MD;  Location: [facility] CV LAB;  Service: Cardiovascular;  Laterality: N/A;  . IR FLUORO GUIDE PORT INSERTION RIGHT  08/20/2016  . IR REMOVAL TUN ACCESS W/ PORT W/O FL MOD SED  01/20/2017  . IR US GUIDE VASC ACCESS RIGHT  08/20/2016  . LAPAROSCOPY N/A 07/12/2016   Procedure: LAPAROSCOPY DIAGNOSTIC;  Surgeon: Rolm Bookbinder, MD;  Location: [facility];  Service: General;  Laterality: N/A;  . LEFT HEART CATHETERIZATION WITH CORONARY/GRAFT ANGIOGRAM N/A 07/26/2012   Procedure: LEFT HEART CATHETERIZATION WITH Beatrix Fetters;  Surgeon: Wellington Hampshire, MD;  Location: [facility] CATH LAB;  Service: Cardiovascular;  Laterality: N/A;  . LUNG BIOPSY    . PERCUTANEOUS CORONARY STENT INTERVENTION (PCI-S)  07/26/2012   Procedure: PERCUTANEOUS CORONARY STENT INTERVENTION (PCI-S);  Surgeon: Wellington Hampshire, MD;  Location: [facility] CATH LAB;  Service: Cardiovascular;;  . RENAL BIOPSY    . RIGHT/LEFT HEART CATH AND CORONARY/GRAFT ANGIOGRAPHY N/A 03/23/2017   Procedure: RIGHT/LEFT HEART CATH AND CORONARY/GRAFT ANGIOGRAPHY;  Surgeon: Martinique, Peter M, MD;  Location: [facility] CV LAB;  Service: Cardiovascular;  Laterality: N/A;  . TOE AMPUTATION  2009   hammer toe        Home Medications    Prior to Admission medications   Medication Sig Start Date End Date Taking? Authorizing Provider  acetaminophen (TYLENOL) 500 MG tablet Take 500 mg by mouth daily as needed for moderate pain or headache.    Yes [provider]  aspirin EC 81 MG EC tablet Take 1 tablet (81 mg total)  by mouth daily. 07/29/12  Yes Theora Gianotti, NP  clopidogrel (PLAVIX) 75 MG tablet TAKE 1 TABLET BY MOUTH  DAILY Patient taking differently: Take 75 mg by mouth daily.  11/09/17  Yes [name], MD  dorzolamide-timolol (COSOPT) 22.3-6.8 MG/ML ophthalmic solution Place 1 drop into both eyes 2 (two) times daily.    Yes [provider]  finasteride (PROSCAR) 5 MG tablet Take 5 mg by mouth at bedtime.    Yes [provider]  furosemide (LASIX) 20 MG tablet TAKE 1 TABLET BY MOUTH  DAILY Patient taking differently: Take 20 mg by mouth daily.  06/16/17  Yes [name], MD  gabapentin (NEURONTIN) 100 MG capsule Take 100 mg by mouth 2 (two) times daily.   Yes [provider]  levothyroxine (SYNTHROID, LEVOTHROID) 100 MCG tablet Take 100 mcg by mouth daily before breakfast.    Yes [provider]  metoprolol succinate (TOPROL XL) 25  MG 24 hr tablet Take 1 tablet (25 mg total) by mouth daily. 10/07/17  Yes [name], MD  Multiple Vitamin (MULTIVITAMIN) tablet Take 1 tablet by mouth daily.     Yes [provider]  nitroGLYCERIN (NITROSTAT) 0.4 MG SL tablet DISSOLVE 1 TABLET UNDER THE TONGUE EVERY 5 MINUTES AS  NEEDED FOR CHEST PAIN.  DO  NOT EXCEED A TOTAL OF 3  DOSES IN 15 MINUTES. Patient taking differently: Place 0.4 mg under the tongue every 5 (five) minutes as needed for chest pain.  11/08/17  Yes [name], MD  pantoprazole (PROTONIX) 40 MG tablet TAKE 1 TABLET BY MOUTH  DAILY Patient taking differently: Take 40 mg by mouth daily.  04/11/17  Yes [name], MD  rosuvastatin (CRESTOR) 10 MG tablet Take 10 mg every morning by mouth. 03/22/17  Yes [provider]  Tamsulosin HCl (FLOMAX) 0.4 MG CAPS Take 0.4 mg by mouth at bedtime.    Yes [provider]  diclofenac sodium (VOLTAREN) 1 % GEL Apply 2 g topically 4 (four) times daily. Patient not taking: Reported on 01/29/2018 10/12/17   Leandrew Koyanagi, MD   isosorbide-hydrALAZINE (BIDIL) 20-37.5 MG tablet Take 1 tablet by mouth 2 (two) times daily. Patient not taking: Reported on 01/29/2018 10/07/17   [name], MD  telmisartan (MICARDIS) 20 MG tablet Take 1 tablet (20 mg total) daily by mouth. Patient not taking: Reported on 01/29/2018 04/02/17   Leanor Kail, PA    Family History Family History  Problem Relation Age of Onset  . Heart disease Mother   . Diabetes Mother   . Prostate cancer Father   . Depression Other   . Diabetes Other   . Prostate cancer Other   . Colon polyps Neg Hx   . Colon cancer Neg Hx   . Rectal cancer Neg Hx   . Stomach cancer Neg Hx   . Esophageal cancer Neg Hx     Social History Social History   Tobacco Use  . Smoking status: Never Smoker  . Smokeless tobacco: Never Used  Substance Use Topics  . Alcohol use: No  . Drug use: No     Allergies   Patient has no known allergies.   Review of Systems Review of Systems  All other systems reviewed and are negative.    Physical Exam Updated Vital Signs BP (!) 144/83 (BP Location: Right Arm)   Pulse 86   Temp 97.9 F (36.6 C) (Oral)   Resp 18   Ht 1.829 m (6')   Wt 100.2 kg   SpO2 98%   BMI 29.97 kg/m   Physical Exam  Constitutional: He is oriented to person, place, and time. He appears well-developed and well-nourished.  Non-toxic appearance. No distress.  HENT:  Head: Normocephalic and atraumatic.  Eyes: Pupils are equal, round, and reactive to light. Conjunctivae, EOM and lids are normal.  Neck: Normal range of motion. Neck supple. No tracheal deviation present. No thyroid mass present.  Cardiovascular: Normal rate, regular rhythm and normal heart sounds. Exam reveals no gallop.  No murmur heard. Pulmonary/Chest: Effort normal and breath sounds normal. No stridor. No respiratory distress. He has no decreased breath sounds. He has no wheezes. He has no rhonchi. He has no rales.  Abdominal: Soft. Normal appearance and bowel  sounds are normal. He exhibits no distension. There is no tenderness. There is no rebound and no CVA tenderness.  Musculoskeletal: Normal range of motion. He exhibits no edema  or tenderness.  Neurological: He is alert and oriented to person, place, and time. He has normal strength. No cranial nerve deficit or sensory deficit. GCS eye subscore is 4. GCS verbal subscore is 5. GCS motor subscore is 6.  Skin: Skin is warm and dry. No abrasion and no rash noted.  Psychiatric: He has a normal mood and affect. His speech is normal and behavior is normal.  Nursing note and vitals reviewed.    ED Treatments / Results  Labs (all labs ordered are listed, but only abnormal results are displayed) Labs Reviewed  BASIC METABOLIC PANEL - Abnormal; Notable for the following components:      Result Value   Glucose, Bld 131 (*)    BUN 29 (*)    Creatinine, Ser 2.21 (*)    GFR calc non Af Amer 26 (*)    GFR calc Af Amer 31 (*)    All other components within normal limits  CBC - Abnormal; Notable for the following components:   RBC 3.54 (*)    Hemoglobin 11.1 (*)    HCT 37.3 (*)    MCV 105.4 (*)    MCHC 29.8 (*)    All other components within normal limits  URINALYSIS, ROUTINE W REFLEX MICROSCOPIC  TROPONIN I  BRAIN NATRIURETIC PEPTIDE  CBG MONITORING, ED    EKG EKG Interpretation  Date/Time:  Sunday January 29 2018 11:23:14 EDT Ventricular Rate:  91 PR Interval:  158 QRS Duration: 106 QT Interval:  368 QTC Calculation: 452 R Axis:   79 Text Interpretation:  Sinus rhythm with occasional Premature ventricular complexes Nonspecific ST and T wave abnormality Abnormal ECG No significant change since last tracing Confirmed by Lacretia Leigh ([id number]) on 01/29/2018 12:23:44 PM   Radiology Dg Chest 2 View  Result Date: 01/29/2018 CLINICAL DATA:  Shortness of breath since Tuesday. EXAM: CHEST - 2 VIEW COMPARISON:  01/11/2018 FINDINGS: The cardiac silhouette, mediastinal and hilar contours are  within normal limits and stable. Stable surgical changes from coronary artery bypass surgery. There are streaky basilar scarring changes and possibly superimposed subsegmental atelectasis but no infiltrates, edema or effusions. No worrisome pulmonary lesions. The bony thorax is intact. IMPRESSION: Streaky basilar scarring changes and possible superimposed atelectasis but no infiltrates, edema or effusions. Electronically Signed   By: Marijo Sanes M.D.   On: 01/29/2018 12:21    Procedures Procedures (including critical care time)  Medications Ordered in ED Medications - No data to display   Initial Impression / Assessment and Plan / ED Course  I have reviewed the triage vital signs and the nursing notes.  Pertinent labs & imaging results that were available during my care of the patient were reviewed by me and considered in my medical decision making (see chart for details).     Pt here with weakness and sob Pt has elevated bnp Suspect new onset chf Will give lasix and admit to medicine service  Final Clinical Impressions(s) / ED Diagnoses   Final diagnoses:  None    ED Discharge Orders    None       Lacretia Leigh, MD 01/29/18 1603

## 2018-01-29 NOTE — ED Triage Notes (Signed)
Pt BIB POV for eval of shortness of breath and weakness since Tuesday. Denies chest pain, cough, dysuria.

## 2018-01-29 NOTE — Progress Notes (Signed)
Dinner order placed  [employer], Therapist, sports

## 2018-01-29 NOTE — Progress Notes (Addendum)
New pt admission from ED. Pt brought to the floor in stable condition. Vitals taken. Initial Assessment done. All immediate pertinent needs to patient addressed. Patient Guide given to patient. Important safety instructions relating to hospitalization reviewed with patient. Patient verbalized understanding.pt's wife with her. Will continue to monitor pt.  Left eye blind (hx of Glucoma) Lower denture partial plate  Palma Holter, RN

## 2018-01-29 NOTE — ED Notes (Signed)
Regular Tray ordered

## 2018-01-30 ENCOUNTER — Inpatient Hospital Stay (HOSPITAL_COMMUNITY): Payer: Medicare Other

## 2018-01-30 DIAGNOSIS — R252 Cramp and spasm: Secondary | ICD-10-CM | POA: Diagnosis not present

## 2018-01-30 DIAGNOSIS — L959 Vasculitis limited to the skin, unspecified: Secondary | ICD-10-CM

## 2018-01-30 DIAGNOSIS — Z7902 Long term (current) use of antithrombotics/antiplatelets: Secondary | ICD-10-CM

## 2018-01-30 DIAGNOSIS — D631 Anemia in chronic kidney disease: Secondary | ICD-10-CM

## 2018-01-30 DIAGNOSIS — R0601 Orthopnea: Secondary | ICD-10-CM

## 2018-01-30 DIAGNOSIS — I43 Cardiomyopathy in diseases classified elsewhere: Secondary | ICD-10-CM

## 2018-01-30 DIAGNOSIS — N183 Chronic kidney disease, stage 3 (moderate): Secondary | ICD-10-CM | POA: Diagnosis not present

## 2018-01-30 DIAGNOSIS — I5040 Unspecified combined systolic (congestive) and diastolic (congestive) heart failure: Secondary | ICD-10-CM

## 2018-01-30 DIAGNOSIS — E1122 Type 2 diabetes mellitus with diabetic chronic kidney disease: Secondary | ICD-10-CM | POA: Diagnosis not present

## 2018-01-30 DIAGNOSIS — Z9114 Patient's other noncompliance with medication regimen: Secondary | ICD-10-CM

## 2018-01-30 DIAGNOSIS — I2510 Atherosclerotic heart disease of native coronary artery without angina pectoris: Secondary | ICD-10-CM

## 2018-01-30 DIAGNOSIS — E039 Hypothyroidism, unspecified: Secondary | ICD-10-CM

## 2018-01-30 DIAGNOSIS — Z8572 Personal history of non-Hodgkin lymphomas: Secondary | ICD-10-CM

## 2018-01-30 DIAGNOSIS — R531 Weakness: Secondary | ICD-10-CM | POA: Diagnosis not present

## 2018-01-30 DIAGNOSIS — I130 Hypertensive heart and chronic kidney disease with heart failure and stage 1 through stage 4 chronic kidney disease, or unspecified chronic kidney disease: Principal | ICD-10-CM

## 2018-01-30 DIAGNOSIS — C8330 Diffuse large B-cell lymphoma, unspecified site: Secondary | ICD-10-CM | POA: Diagnosis not present

## 2018-01-30 DIAGNOSIS — E785 Hyperlipidemia, unspecified: Secondary | ICD-10-CM

## 2018-01-30 DIAGNOSIS — Z9049 Acquired absence of other specified parts of digestive tract: Secondary | ICD-10-CM

## 2018-01-30 DIAGNOSIS — Z79890 Hormone replacement therapy (postmenopausal): Secondary | ICD-10-CM

## 2018-01-30 DIAGNOSIS — Z9221 Personal history of antineoplastic chemotherapy: Secondary | ICD-10-CM

## 2018-01-30 DIAGNOSIS — Z951 Presence of aortocoronary bypass graft: Secondary | ICD-10-CM

## 2018-01-30 DIAGNOSIS — N184 Chronic kidney disease, stage 4 (severe): Secondary | ICD-10-CM | POA: Diagnosis not present

## 2018-01-30 DIAGNOSIS — Z79899 Other long term (current) drug therapy: Secondary | ICD-10-CM

## 2018-01-30 DIAGNOSIS — I5043 Acute on chronic combined systolic (congestive) and diastolic (congestive) heart failure: Secondary | ICD-10-CM | POA: Diagnosis not present

## 2018-01-30 DIAGNOSIS — Z955 Presence of coronary angioplasty implant and graft: Secondary | ICD-10-CM

## 2018-01-30 LAB — BASIC METABOLIC PANEL
Anion gap: 12 (ref 5–15)
BUN: 36 mg/dL — ABNORMAL HIGH (ref 8–23)
CHLORIDE: 106 mmol/L (ref 98–111)
CO2: 23 mmol/L (ref 22–32)
Calcium: 9.3 mg/dL (ref 8.9–10.3)
Creatinine, Ser: 2.38 mg/dL — ABNORMAL HIGH (ref 0.61–1.24)
GFR, EST AFRICAN AMERICAN: 28 mL/min — AB (ref >60)
GFR, EST NON AFRICAN AMERICAN: 24 mL/min — AB (ref >60)
Glucose, Bld: 117 mg/dL — ABNORMAL HIGH (ref 70–99)
POTASSIUM: 4.3 mmol/L (ref 3.5–5.1)
SODIUM: 141 mmol/L (ref 135–145)

## 2018-01-30 LAB — CBC
HEMATOCRIT: 36.6 % — AB (ref 39.0–52.0)
HEMOGLOBIN: 11.2 g/dL — AB (ref 13.0–17.0)
MCH: 31.3 pg (ref 26.0–34.0)
MCHC: 30.6 g/dL (ref 30.0–36.0)
MCV: 102.2 fL — AB (ref 78.0–100.0)
Platelets: 207 10*3/uL (ref 150–400)
RBC: 3.58 MIL/uL — AB (ref 4.22–5.81)
RDW: 14.7 % (ref 11.5–15.5)
WBC: 7.7 10*3/uL (ref 4.0–10.5)

## 2018-01-30 LAB — ECHOCARDIOGRAM COMPLETE
HEIGHTINCHES: 72 in
Weight: 3536 oz

## 2018-01-30 MED ORDER — AZATHIOPRINE 50 MG PO TABS: 100.0000 mg | ORAL_TABLET | Freq: Every day | ORAL | 1 refills | Status: DC

## 2018-01-30 MED ORDER — AZATHIOPRINE 50 MG PO TABS
100.0000 mg | ORAL_TABLET | Freq: Every day | ORAL | Status: DC
  Administered 2018-01-30: 100 mg via ORAL
  Filled 2018-01-30: qty 2

## 2018-01-30 MED ORDER — AZATHIOPRINE 50 MG PO TABS
50.0000 mg | ORAL_TABLET | Freq: Every day | ORAL | Status: DC
  Filled 2018-01-30: qty 1

## 2018-01-30 NOTE — Progress Notes (Signed)
  Echocardiogram 2D Echocardiogram has been performed.  Luke Mcbride 01/30/2018, 4:03 PM

## 2018-01-30 NOTE — Progress Notes (Signed)
   Subjective:   Overnight: No acute events   Today, [name] and evaluated at bedside this AM. He was observed resting comfortably in bed. He reports of difficulty sleeping last night due to watery eyes and frequent urination 2/2 Lasix. He kept his bed at ~30 degrees incline. States usually 2 pillow orthopnea at home His breathing appears to be improving. States dyspnea on exertion at baseline(walking to mailbox). Also states he has mild lower extremity edema at baseline.  He was able to tolerate breakfast and currently denies loss of appetite. Endorsing mild constipation but had howel movement this AM. He endorsing some acute left hand cramping. Denies any chest pain, palpitations, nausea, vomiting.  Objective:  Vital signs in last 24 hours: Vitals:   01/29/18 1810 01/29/18 2043 01/30/18 0102 01/30/18 0905  BP: (!) 154/88 138/80 108/73 110/72  Pulse: 83 82 72 70  Resp: 20 18 20    Temp: 97.9 F (36.6 C) 98.6 F (37 C) 99.1 F (37.3 C)   TempSrc: Oral Oral Oral   SpO2: 98% 94% 95%   Weight:      Height:       Const: In NAD, pleasantly sitting on bed  CV: RRR, no MGR Resp: CTABL, no wheezes, crackles, rhonchi  Abd: BS+, NTTP Ext: Trace bilateral edema - baseline   Assessment/Plan:  Active Problems:   Acute on chronic heart failure (HCC)   Weakness  [name] is an 82 year old pleasant gentleman with medical history significant for combined systolic and diastolic heart failure EF 35-40% (2018) secondary to ischemic cardiomyopathy, CAD s/p CABG x6 in 2002, PCI in 2014 and 2018, Stage IV Diffuse large B-cell lymphoma in remissions/p chemotherapy and small bowel resection 2018, Pauci Immune pulmonary-renal vasculitis, Type II Diabetes Mellitus, Hypertension, Hyperlipidemia, Chronic Kidney Disease III and hypothyroidism here with suspected CHF exacerbation.  Suspected Acute on chronic combined systolic and diastolic heart failure: He has chronic 2-pillow orthopnea which remains  unchanged. He reports of improvement with SOB and actually denies DOE or lower extremity edema. UOP in last 24 hrs of 200cc (net negative 80cc). There is no recorded weight this AM however on admission his weight was 100.2kg<<106.7kg. He is status post 1 dose of IV lasix 40mg  with no significant diuresis and I'm unsure if he is truly in HF exacerbation. On Physical exam, he appears euvolemic with no evidence of pulmonary crackles. I will obtain echocardiogram to access heart function.  -Hold further diuresis for now -Pending echocardiogram  -Strict I/Os -Daily weights -Telemetry   CAD: s/p CABG x6 in 2002, PCI in 2014 and 2018 -Continue Plavix 75mg  daily, Crestor 10mg  daily  -Cardiology appointment with Dr. [name] 02/08/2018  Chronic kidney disease III-IV: Stable. [Baseline 2.4]  Type II Diabetes Mellitus: Well controlled, on no medication  -Monitor CBGs  Hypertension: Stable. Will hold antihypertensives for now  --Home meds: isosorbide-hydralazine, toprol-XL, telmisartan  Dispo: Anticipated discharge pending echocardiogram.  Jean Rosenthal, MD 01/30/2018 Pager: [phone] [facility] PGY-1

## 2018-01-30 NOTE — Progress Notes (Signed)
  Date: 01/30/2018  Patient name: Luke Mcbride  Medical record number: [id number]  Date of birth: 1935-10-08   I have seen and evaluated this patient and I have discussed the plan of care with the house staff. Please see their note for complete details. I concur with their findings with the following additions/corrections: Mr. Saindon orthopnea, PND, and dyspnea on exertion which were the reasons for his admission have resolved.  He did state he got a little dyspneic this morning but it resolved when he ate his breakfast.  He did not have a weight recorded this morning.  As recorded in the chart, he was net -80 cc yesterday but that does not seem accurate.  His creatinine is elevated at 2.38 but this is stable for him.  His lungs have good airflow without any rails.  He does have lower extremity edema, right greater than left, but this is chronic as he had his vein harvesting from his right leg for his CABG.  I agree with Dr. Eileen Stanford that this does not seem consistent with a heart failure exacerbation.  His chest x-ray does not show an infiltrate.  He is not anemic.  His EKG and troponin were not consistent with an acute MI.  As his symptoms have resolved, we are awaiting his echo and if stable, he will be able to be discharged home on his home dose of diuretic.  He will need close follow-up as apparently he had some renal dysfunction as an outpatient while on his diuretic.  Bartholomew Crews, MD 01/30/2018, 2:32 PM

## 2018-01-30 NOTE — Progress Notes (Signed)
Pt discharge instructions reviewed with pt and wife. Pt and wife verbalize understanding and state they have no questions. Pt belongings with pt. Pt is not in distress. Pt discharged via wheelchair. Pt's wife is driving him home.

## 2018-01-30 NOTE — Discharge Summary (Signed)
Name: Luke Mcbride MRN: [id number] DOB: 04/05/1936 82 y.o. PCP: Donato Heinz, MD  Date of Admission: 01/29/2018 11:07 AM Date of Discharge: 01/30/2018 Attending Physician: Dr. Lynnae January   Discharge Diagnosis: 1. Acute on chronic combined systolic and diastolic heart failure 2. Coronary artery disease s/p CABG x6 in 2002, PCI in 2014 and 2018 3. Chronic kidney disease III-IV 4. Type II Diabetes Mellitus 5. Hypertension 6. Stage IV Diffuse large B-cell lymphoma 7. Pauci Immune pulmonary-renal vasculitis 8. Hypothyroidism 9. Anemia of chronic disease   Discharge Medications: Allergies as of 01/30/2018   No Known Allergies     Medication List    TAKE these medications   acetaminophen 500 MG tablet Commonly known as:  TYLENOL Take 500 mg by mouth daily as needed for moderate pain or headache.   aspirin 81 MG EC tablet Take 1 tablet (81 mg total) by mouth daily.   azaTHIOprine 50 MG tablet Commonly known as:  IMURAN Take 2 tablets (100 mg total) by mouth daily.   clopidogrel 75 MG tablet Commonly known as:  PLAVIX TAKE 1 TABLET BY MOUTH  DAILY   diclofenac sodium 1 % Gel Commonly known as:  VOLTAREN Apply 2 g topically 4 (four) times daily.   dorzolamide-timolol 22.3-6.8 MG/ML ophthalmic solution Commonly known as:  COSOPT Place 1 drop into both eyes 2 (two) times daily.   finasteride 5 MG tablet Commonly known as:  PROSCAR Take 5 mg by mouth at bedtime.   FLOMAX 0.4 MG Caps capsule Generic drug:  tamsulosin Take 0.4 mg by mouth at bedtime.   furosemide 20 MG tablet Commonly known as:  LASIX TAKE 1 TABLET BY MOUTH  DAILY   gabapentin 100 MG capsule Commonly known as:  NEURONTIN Take 100 mg by mouth 2 (two) times daily.   isosorbide-hydrALAZINE 20-37.5 MG tablet Commonly known as:  BIDIL Take 1 tablet by mouth 2 (two) times daily.   levothyroxine 100 MCG tablet Commonly known as:  SYNTHROID, LEVOTHROID Take 100 mcg by mouth daily before  breakfast.   metoprolol succinate 25 MG 24 hr tablet Commonly known as:  TOPROL-XL Take 1 tablet (25 mg total) by mouth daily.   multivitamin tablet Take 1 tablet by mouth daily.   nitroGLYCERIN 0.4 MG SL tablet Commonly known as:  NITROSTAT DISSOLVE 1 TABLET UNDER THE TONGUE EVERY 5 MINUTES AS  NEEDED FOR CHEST PAIN.  DO  NOT EXCEED A TOTAL OF 3  DOSES IN 15 MINUTES. What changed:  See the new instructions.   pantoprazole 40 MG tablet Commonly known as:  PROTONIX TAKE 1 TABLET BY MOUTH  DAILY   rosuvastatin 10 MG tablet Commonly known as:  CRESTOR Take 10 mg every morning by mouth.   telmisartan 20 MG tablet Commonly known as:  MICARDIS Take 1 tablet (20 mg total) daily by mouth.       Disposition and follow-up:   [name] was discharged from [facility] in [place] condition.  At the hospital follow up visit please address:  1.  Acute on chronic combined systolic and diastolic heart failure: Repeat Echo showed EF of 35-40%.   2.  Labs / imaging needed at time of follow-up: BMP  3.  Pending labs/ test needing follow-up: None  Follow-up Appointments:   Hospital Course by problem list: 1. Suspected Acute on chronic combined systolic and diastolic heart failure: [name] is an 82 year old pleasant gentleman with medical history significant for combined systolic and diastolic heart failure EF 35-40%  secondary to ischemic cardiomyopathy, CAD s/p CABG x6 in 2002, PCI in 2014 and 2018, Stage IV Diffuse large B-cell lymphoma in remissions/p chemotherapy and small bowel resection 2018, Pauci Immune pulmonary-renal vasculitis, Type II Diabetes Mellitus, Hypertension, Hyperlipidemia, Chronic Kidney Disease III, hypothyroidism who presented with 5 day history of DOE, orthopnea, PND most likely secondary to medication non-compliance. He reports that he had not been taking his Lasix for the past week prior to admission. In the ED, BNP elevated at 427, CXR was  unremarkable. He had mild bibasilar rales on lung auscultation. His recorded weight on admission was 100.2kg however it was 106.7kg (1 month ago) and 110.5kg (3 months ago). He received a one time dose of IV Lasix 40mg  which seemed to have improved his symptoms on the following hospital day.  2. CAD: s/p CABG x6 in 2002, PCI in 2014 and 2018. He was continued on Plavix 75mg  daily, Crestor 10mg  daily. He has Cardiology appointment with Dr. [name] 02/08/2018  3. Chronic kidney disease III-IV: Renal function remained stable.   4. Hypertension: Per chart review, there have been episodes of hypotension with SBP in the 90s. His mome antihypertensives include isosorbide-hydralazine, toprol-XL, telmisartan however ut these were held during this admission.   5. Stage IV Diffuse large B-cell lymphoma: He is currently in remissionand is status post chemotherapy and small bowel resection 2018. PET/CT scan on 12/29/2016 showed no evidence of "residual or recurrent hypermetabolic lymphoma." He follows up with Dr. Irene Limbo and has oncology appointment with Dr. Irene Limbo: 03/29/18  6. 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  He received IV Cytoxan with reported good response but however relapsed with symptoms of hemoptysis and pulmonary hemorrhage and subsequently underwent plasmapheresis and another round of Cytoxan. He has since been on Imuran which was briefly held during  chemotherapy in 2018. He stated that he takes Imuran 100mg  am and 50mg  pm which was also confirmed on chart review from  Dr. Victorino Sparrow note on 01/05/2018  7. Hypothyroidism: We continued synthroid 100 mcg daily   Discharge Vitals:   BP 110/72   Pulse 70   Temp 99.1 F (37.3 C) (Oral)   Resp 20   Ht 6' (1.829 m)   Wt 100.2 kg   SpO2 95%   BMI 29.97 kg/m   Pertinent Labs, Studies, and Procedures:    Echo 2018/02/21  LV EF: 35% -   40%  ------------------------------------------------------------------- Indications:      Dyspnea 786.09.  ------------------------------------------------------------------- Study Conclusions  - Left ventricle: The cavity size was normal. Systolic function was   moderately reduced. The estimated ejection fraction was in the   range of 35% to 40%. Diffuse hypokinesis. There is akinesis of   the inferolateral and inferior myocardium. Doppler parameters are   consistent with abnormal left ventricular relaxation (grade 1   diastolic dysfunction). - Aortic valve: Trileaflet; mildly thickened, mildly calcified   leaflets. Valve area (VTI): 2.5 cm^2. Valve area (Vmax): 2.3   cm^2. Valve area (Vmean): 2.09 cm^2. - Aorta: Ascending aortic diameter: 39 mm (S). - Ascending aorta: The ascending aorta was mildly dilated. - Mitral valve: Calcified annulus. Mild prolapse, involving the   anterior leaflet. - Left atrium: The atrium was moderately dilated. - Right ventricle: The cavity size was moderately dilated. Wall   thickness was normal. - Right atrium: The atrium was moderately dilated.  Impressions:  - Compared to the  prior study, there has been no significant   interval change.  Discharge Instructions: Discharge Instructions    Call MD for:  difficulty breathing, headache or visual disturbances   Complete by:  As directed    Call MD for:  extreme fatigue   Complete by:  As directed    Call MD for:  persistant dizziness or light-headedness   Complete by:  As directed    Call MD for:  persistant nausea and vomiting   Complete by:  As directed    Call MD for:  severe uncontrolled pain   Complete by:  As directed    Diet - low sodium heart healthy   Complete by:  As directed    Discharge instructions   Complete by:  As directed    Mr. Derousse,  It was a pleasure taking care of you at the hospital. You were admitted because of trouble  breathing and weakness. We thought this was due to heart failure as one of your labs came back abnormal. We gave you Lasix and also got an echocardiogram. The Echocardiogram is essentially the same as your old one.   Please follow up with your cardiologist, nephrologist and oncologist.   ~Take Care Dr. Eileen Stanford   Increase activity slowly   Complete by:  As directed       Signed: Jean Rosenthal, MD 02/01/2018, 8:59 PM   Pager: [phone] [facility]MT[facility] PGY-1

## 2018-01-31 NOTE — Progress Notes (Signed)
Cardiology Office Note   Date:  02/08/2018   ID:  Luke Mcbride, DOB 08/24/1935, MRN [id number]  PCP:  Donato Heinz, MD  Cardiologist:  Dr. [name]    No chief complaint on file.     History of Present Illness: Luke Mcbride is a 82 y.o. male who presents for f/u CAD/CABG 2002, DM, HTN, HLD and CKD  He was admitted 07/2012 with NSTEMI and underwent BMS to the SVR-PDA/PL. His LIMA to LAD was atretic but disease in the LAD was moderate. SVG-OM and SVG to diagonal were known to be occluded.  06/21/16 myovue scar no ischemia low risk  However he was admitted for angina and had cath 03/23/17 with new native mid LAD lesion that was stented due to atretic LIMA Echo with EF 35-40%   Diagnosed with Stage IV Diffuse large B-cell lymphoma involving the small intestine.. He had 6 inchesof his colon resected. Following surgery he had 6 rounds of chemo. F/u PET scan and was told he was in remission.   Cath 04/02/17   1. Severe 3 vessel obstructive CAD. 2. Patent SVG to PDA/PLOM. Stent in proximal SVG is patent 3. Occluded SVG to the first diagonal. The first diagonal is small in caliber 4. Occluded SVG to OM 5. Atretic LIMA to the LAD 6. Normal LVEDP 7. Normal right heart pressures.   Activity limited by right knee arthritis and neuropathy likely from chemo therapy   D/c from hospital 01/30/18  Volume overload.  5 day history of dyspnea orthopnea BNP 427 Nephrologist had held his diuretic  For low BP even though BP at home was ok TTE reviewed from 01/30/18 and stable with EF 35-40% trivial MR   Still with low appetite and unintentional weight loss LE edema better   Past Medical History:  Diagnosis Date  . ALLERGIC RHINITIS   . Anemia    hx  . Barrett's esophagus   . BOOP (bronchiolitis obliterans with organizing pneumonia) ([place])    a. s/p R VATS 2008.  [name] CAD (coronary artery disease)    a. 05/2000: NSTEMI/CABG x 6: LIMA->LAD, VG->D1, VG->OM1->2, VG->PDA->RPL;  b. 07/2007 MV:  high lat infarct, no ischemia, EF 47%;  c. Cath/PCI: LM nl, LAD20p, 20m, D1 nl, D2 60-70ost, D3 nl, LCX 70ost, 136m, OM1/OM2 min irregs, RCA 30 diff, PDA 99, LIMA->LAD atretic, VG->D1 100, VG->OM1->2 100, VG->PDA->RPL 90p (4.0x23 Vision BMS);  c. 07/2012 Echo: EF 55%, gr1 DD.  [name] Cataract   . CKD (chronic kidney disease), stage III ([place])    a. renal bx 2008: GLN with vasculitis  . Diffuse large B cell lymphoma ([place]) 08/07/2016  . GERD (gastroesophageal reflux disease)   . Glaucoma    a. Cannot see out of L eye.  . Hematuria    Microscopic  . Hyperglycemia    Patient reported while on prednisone, had to take insulin  . Hyperlipemia   . Hypertension   . Hypothyroidism   . ILD (interstitial lung disease) ([place])   . Iritis   . Local infection of skin and subcutaneous tissue   . Membranoproliferative nephritis   . Myocardial infarction ([place]) 2002  . Neutropenia, drug-induced ([place])   . Recurrent boils   . Residual foreign body in soft tissue   . Vasculitis ([place])    a.  pauciimmune vasculitis with renal involvement and hx of transient hemoptysis in the past with associated BOOP, 2008 (renal bx 2008: GLN with vasculitis). b. History of treatment with 2 cycles of Cytoxan  and pheresis. H/o hemoptysis and pulm hemorrhage with 2nd cycle of cytoxan.    Past Surgical History:  Procedure Laterality Date  . BOWEL RESECTION N/A 07/12/2016   Procedure: SMALL BOWEL RESECTION;  Surgeon: Rolm Bookbinder, MD;  Location: [facility];  Service: General;  Laterality: N/A;  . CATARACT EXTRACTION Bilateral   . CORONARY ARTERY BYPASS GRAFT  2002  . CORONARY STENT INTERVENTION N/A 03/31/2017   Procedure: CORONARY STENT INTERVENTION;  Surgeon: Martinique, Lenoir Facchini M, MD;  Location: [facility];  Service: Cardiovascular;  Laterality: N/A;  . IR FLUORO GUIDE PORT INSERTION RIGHT  08/20/2016  . IR REMOVAL TUN ACCESS W/ PORT W/O FL MOD SED  01/20/2017  . IR US GUIDE VASC ACCESS RIGHT  08/20/2016  . LAPAROSCOPY N/A 07/12/2016    Procedure: LAPAROSCOPY DIAGNOSTIC;  Surgeon: Rolm Bookbinder, MD;  Location: [facility];  Service: General;  Laterality: N/A;  . LEFT HEART CATHETERIZATION WITH CORONARY/GRAFT ANGIOGRAM N/A 07/26/2012   Procedure: LEFT HEART CATHETERIZATION WITH Beatrix Fetters;  Surgeon: Wellington Hampshire, MD;  Location: [facility];  Service: Cardiovascular;  Laterality: N/A;  . LUNG BIOPSY    . PERCUTANEOUS CORONARY STENT INTERVENTION (PCI-S)  07/26/2012   Procedure: PERCUTANEOUS CORONARY STENT INTERVENTION (PCI-S);  Surgeon: Wellington Hampshire, MD;  Location: [facility] CATH LAB;  Service: Cardiovascular;;  . RENAL BIOPSY    . RIGHT/LEFT HEART CATH AND CORONARY/GRAFT ANGIOGRAPHY N/A 03/23/2017   Procedure: RIGHT/LEFT HEART CATH AND CORONARY/GRAFT ANGIOGRAPHY;  Surgeon: Martinique, Anapaula Severt M, MD;  Location: [facility];  Service: Cardiovascular;  Laterality: N/A;  . TOE AMPUTATION  2009   hammer toe     Current Outpatient Medications  Medication Sig Dispense Refill  . acetaminophen (TYLENOL) 500 MG tablet Take 500 mg by mouth daily as needed for moderate pain or headache.     [name] aspirin EC 81 MG EC tablet Take 1 tablet (81 mg total) by mouth daily. 30 tablet   . azaTHIOprine (IMURAN) 50 MG tablet Take 2 tablets (100 mg total) by mouth daily. 90 tablet 1  . clopidogrel (PLAVIX) 75 MG tablet TAKE 1 TABLET BY MOUTH  DAILY 90 tablet 3  . dorzolamide-timolol (COSOPT) 22.3-6.8 MG/ML ophthalmic solution Place 1 drop into both eyes 2 (two) times daily.     . finasteride (PROSCAR) 5 MG tablet Take 5 mg by mouth at bedtime.     . furosemide (LASIX) 20 MG tablet TAKE 1 TABLET BY MOUTH  DAILY 90 tablet 2  . levothyroxine (SYNTHROID, LEVOTHROID) 100 MCG tablet Take 100 mcg by mouth daily before breakfast.     . metoprolol succinate (TOPROL XL) 25 MG 24 hr tablet Take 1 tablet (25 mg total) by mouth daily. 90 tablet 1  . Multiple Vitamin (MULTIVITAMIN) tablet Take 1 tablet by mouth daily.      . nitroGLYCERIN (NITROSTAT) 0.4  MG SL tablet DISSOLVE 1 TABLET UNDER THE TONGUE EVERY 5 MINUTES AS  NEEDED FOR CHEST PAIN.  DO  NOT EXCEED A TOTAL OF 3  DOSES IN 15 MINUTES. 100 tablet 0  . pantoprazole (PROTONIX) 40 MG tablet TAKE 1 TABLET BY MOUTH  DAILY 90 tablet 3  . rosuvastatin (CRESTOR) 10 MG tablet Take 10 mg every morning by mouth.    . Tamsulosin HCl (FLOMAX) 0.4 MG CAPS Take 0.4 mg by mouth at bedtime.      No current facility-administered medications for this visit.    Facility-Administered Medications Ordered in Other Visits  Medication Dose Route Frequency Provider  Last Rate Last Dose  . sodium chloride flush (NS) 0.9 % injection 10 mL  10 mL Intracatheter PRN Brunetta Genera, MD   10 mL at 08/24/16 1653  . sodium chloride flush (NS) 0.9 % injection 10 mL  10 mL Intracatheter PRN Brunetta Genera, MD   10 mL at 10/07/16 1555    Allergies:   Patient has no known allergies.    Social History:  The patient  reports that he has never smoked. He has never used smokeless tobacco. He reports that he does not drink alcohol or use drugs.   Family History:  The patient's family history includes Depression in his other; Diabetes in his mother and other; Heart disease in his mother; Prostate cancer in his father and other.    ROS:  General:no colds or fevers, no weight changes Skin:no rashes or ulcers HEENT:no blurred vision, no congestion CV:see HPI PUL:see HPI GI:no diarrhea constipation or melena, no indigestion GU:no hematuria, no dysuria MS:no joint pain, no claudication Neuro:no syncope, no lightheadedness Endo:no diabetes, + thyroid disease  Wt Readings from Last 3 Encounters:  02/08/18 224 lb 3.2 oz (101.7 kg)  01/29/18 221 lb (100.2 kg)  12/28/17 235 lb 4.8 oz (106.7 kg)     PHYSICAL EXAM: VS:  BP 120/64   Pulse 77   Ht 6' (1.829 m)   Wt 224 lb 3.2 oz (101.7 kg)   SpO2 95%   BMI 30.41 kg/m  , BMI Body mass index is 30.41 kg/m. Affect appropriate Chronically ill white male  HEENT:  normal Neck supple with no adenopathy JVP normal no bruits no thyromegaly Lungs clear with no wheezing and good diaphragmatic motion Heart:  S1/S2 no murmur, no rub, gallop or click PMI normal Abdomen: benighn, BS positve, no tenderness, no AAA no bruit.  No HSM or HJR Distal pulses intact with no bruits Plus one bilateral  Edema a bit worse on right  Neuro non-focal Skin warm and dry No muscular weakness    EKG: 04/01/17 SR nonspecific ST/T wave changes    Recent Labs: 12/28/2017: ALT 13 01/29/2018: B Natriuretic Peptide 427.2 01/30/2018: BUN 36; Creatinine, Ser 2.38; Hemoglobin 11.2; Platelets 207; Potassium 4.3; Sodium 141    Lipid Panel    Component Value Date/Time   CHOL 157 02/11/2014 0733   TRIG 150.0 (H) 02/11/2014 0733   HDL 36.30 (L) 02/11/2014 0733   CHOLHDL 4 02/11/2014 0733   VLDL 30.0 02/11/2014 0733   LDLCALC 91 02/11/2014 0733   LDLDIRECT 221.7 05/23/2007 0000       Other studies Reviewed: Additional studies/ records that were reviewed today include:  Echo 01/18/17 Study Conclusions  - Left ventricle: The cavity size was normal. There was mild focal   basal hypertrophy of the septum. Systolic function was moderately   reduced. The estimated ejection fraction was in the range of 35%   to 40%. There is akinesis of the inferolateral myocardium.   Doppler parameters are consistent with abnormal left ventricular   relaxation (grade 1 diastolic dysfunction). - Mitral valve: Calcified annulus. There was mild regurgitation. - Left atrium: The atrium was mildly dilated. - Atrial septum: There was an atrial septal aneurysm.  Impressions:  - Limited study with definity; full doppler not performed; akinesis   of the inferolateral wall with overall moderately reduced LV   systolic function; mild diastolic dysfunction; mild MR; mild LAE.  ECHO 12/2016  Study Conclusions  - Left ventricle: The cavity size was mildly dilated. There was  mild focal basal  hypertrophy of the septum. Systolic function was   mildly to moderately reduced. The estimated ejection fraction was   in the range of 40% to 45%. Mild diffuse hypokinesis. There was   fusion of early and atrial contributions to ventricular filling.   The study is not technically sufficient to allow evaluation of LV   diastolic function. - Left atrium: The atrium was mildly dilated. - Right ventricle: The cavity size was mildly dilated. Wall   thickness was normal. - Recommendations: Recommend limited repeat study with definity   contrast to assess wall motion and more accurate assessment of   EF.  Recommendations:  Recommend limited repeat study with definity contrast to assess wall motion and more accurate assessment of EF.   NUC study  06/30/16 study Highlights     The left ventricular ejection fraction is moderately decreased (30-44%).  Nuclear stress EF: 40%.  There was no ST segment deviation noted during stress.  No T wave inversion was noted during stress.  Defect 1: There is a large defect of severe severity present in the basal inferolateral, basal anterolateral, mid inferolateral and mid anterolateral location.  Findings consistent with prior myocardial infarction.  This is an intermediate risk study.   Intermediate risk stress nuclear study with a large scar in the left circumflex coronary artery distribution moderate reduction in left ventricular systolic function.     ASSESSMENT AND PLAN:   1. CAD/CABG:  Stable no angina DES to native LAD 04/03/17 DAT 2. Lymphoma:  Diffuse B cell intestinal post surgical resection and chemo Rx in remission f/u oncology Especially given poor appetite and unintentional weight loss 3. Edema: from arthritis and previous vein stripping CABG duplex with no DVT  4. CRF:  Diuretics cut back by nephrology for low BP Cr 2.38 BUN 36 01/30/18 back on lasix 20 mg daily  5. Thyroid:  On synthroid replacement f/u primary TSH  6. CHF:  Hospitalized 01/29/18 after renal stopped diuretics. TTE with stable EF 35-40% cleared quickly With iv lasix On PO lasix 20 mg unable to use ACE/ARB due to renal failure f/u next available see if bidil Needed again   F/U with me next available   Current medicines are reviewed with the patient today.  The patient Has no concerns regarding medicines.  Jenkins Rouge

## 2018-01-31 NOTE — Consult Note (Signed)
            [facility] CM Primary Care Navigator  01/31/2018  CHRIS CRIPPS 1936/01/15 [id number]   Went to seepatient at the bedside to identify possible discharge needs buthe was already discharged homeper staff.  Per MD note,patientpresented with shortness of breath and weakness which have resolved. (suspected acute on chronic combined systolic and diastolic heart failure- does not seem consistent with a heart failure exacerbation per MD, CAD- coronary artery disease)  Patient has discharge instruction to follow-upwith cardiology on 02/08/18 and oncology follow-up in 1 month.   For additional questions please contact:  Edwena Felty A. Mykayla Brinton, BSN, RN-BC [facility] PRIMARY CARE Navigator Cell: [phone]

## 2018-02-03 DIAGNOSIS — N183 Chronic kidney disease, stage 3 (moderate): Secondary | ICD-10-CM | POA: Diagnosis not present

## 2018-02-08 ENCOUNTER — Encounter: Payer: Self-pay | Admitting: Cardiovascular Disease

## 2018-02-08 ENCOUNTER — Ambulatory Visit (INDEPENDENT_AMBULATORY_CARE_PROVIDER_SITE_OTHER): Payer: Medicare Other | Admitting: Cardiovascular Disease

## 2018-02-08 VITALS — BP 120/64 | HR 77 | Ht 72.0 in | Wt 224.2 lb

## 2018-02-08 DIAGNOSIS — I2583 Coronary atherosclerosis due to lipid rich plaque: Secondary | ICD-10-CM | POA: Diagnosis not present

## 2018-02-08 DIAGNOSIS — I2510 Atherosclerotic heart disease of native coronary artery without angina pectoris: Secondary | ICD-10-CM

## 2018-02-08 DIAGNOSIS — I5020 Unspecified systolic (congestive) heart failure: Secondary | ICD-10-CM | POA: Diagnosis not present

## 2018-02-08 DIAGNOSIS — E782 Mixed hyperlipidemia: Secondary | ICD-10-CM | POA: Diagnosis not present

## 2018-02-08 DIAGNOSIS — R0609 Other forms of dyspnea: Secondary | ICD-10-CM

## 2018-02-08 NOTE — Patient Instructions (Addendum)
Medication Instructions:  Your physician recommends that you continue on your current medications as directed. Please refer to the Current Medication list given to you today.  Labwork: NONE  Testing/Procedures: NONE  Follow-Up: Your physician wants you to follow-up next available with Dr. Nishan.   If you need a refill on your cardiac medications before your next appointment, please call your pharmacy.    

## 2018-02-27 DIAGNOSIS — Z9842 Cataract extraction status, left eye: Secondary | ICD-10-CM | POA: Diagnosis not present

## 2018-02-27 DIAGNOSIS — H5211 Myopia, right eye: Secondary | ICD-10-CM | POA: Diagnosis not present

## 2018-02-27 DIAGNOSIS — Z9841 Cataract extraction status, right eye: Secondary | ICD-10-CM | POA: Diagnosis not present

## 2018-02-27 DIAGNOSIS — H44512 Absolute glaucoma, left eye: Secondary | ICD-10-CM | POA: Diagnosis not present

## 2018-02-27 DIAGNOSIS — H401211 Low-tension glaucoma, right eye, mild stage: Secondary | ICD-10-CM | POA: Diagnosis not present

## 2018-03-01 DIAGNOSIS — N179 Acute kidney failure, unspecified: Secondary | ICD-10-CM | POA: Diagnosis not present

## 2018-03-01 DIAGNOSIS — N058 Unspecified nephritic syndrome with other morphologic changes: Secondary | ICD-10-CM | POA: Diagnosis not present

## 2018-03-01 DIAGNOSIS — E669 Obesity, unspecified: Secondary | ICD-10-CM | POA: Diagnosis not present

## 2018-03-01 DIAGNOSIS — C8330 Diffuse large B-cell lymphoma, unspecified site: Secondary | ICD-10-CM | POA: Diagnosis not present

## 2018-03-01 DIAGNOSIS — E7800 Pure hypercholesterolemia, unspecified: Secondary | ICD-10-CM | POA: Diagnosis not present

## 2018-03-01 DIAGNOSIS — E1122 Type 2 diabetes mellitus with diabetic chronic kidney disease: Secondary | ICD-10-CM | POA: Diagnosis not present

## 2018-03-01 DIAGNOSIS — I2510 Atherosclerotic heart disease of native coronary artery without angina pectoris: Secondary | ICD-10-CM | POA: Diagnosis not present

## 2018-03-01 DIAGNOSIS — N183 Chronic kidney disease, stage 3 (moderate): Secondary | ICD-10-CM | POA: Diagnosis not present

## 2018-03-01 DIAGNOSIS — N057 Unspecified nephritic syndrome with diffuse crescentic glomerulonephritis: Secondary | ICD-10-CM | POA: Diagnosis not present

## 2018-03-01 DIAGNOSIS — I129 Hypertensive chronic kidney disease with stage 1 through stage 4 chronic kidney disease, or unspecified chronic kidney disease: Secondary | ICD-10-CM | POA: Diagnosis not present

## 2018-03-01 DIAGNOSIS — R809 Proteinuria, unspecified: Secondary | ICD-10-CM | POA: Diagnosis not present

## 2018-03-01 DIAGNOSIS — N400 Enlarged prostate without lower urinary tract symptoms: Secondary | ICD-10-CM | POA: Diagnosis not present

## 2018-03-06 DIAGNOSIS — Z23 Encounter for immunization: Secondary | ICD-10-CM | POA: Diagnosis not present

## 2018-03-23 ENCOUNTER — Telehealth: Payer: Self-pay | Admitting: Hematology

## 2018-03-23 NOTE — Telephone Encounter (Signed)
GK out 11/13 - per [place] moved lab/fu two weeks out. Spoke with patient re lab/fu 11/27.

## 2018-03-29 ENCOUNTER — Ambulatory Visit: Payer: Medicare Other | Admitting: Hematology

## 2018-03-29 ENCOUNTER — Other Ambulatory Visit: Payer: Medicare Other

## 2018-04-03 ENCOUNTER — Other Ambulatory Visit: Payer: Self-pay | Admitting: Internal Medicine

## 2018-04-11 ENCOUNTER — Other Ambulatory Visit: Payer: Self-pay | Admitting: Cardiovascular Disease

## 2018-04-11 NOTE — Progress Notes (Signed)
HEMATOLOGY/ONCOLOGY CLINIC NOTE  Date of Service: 04/12/18  Patient Care Team: Donato Heinz, MD as PCP - General (Nephrology) [name], MD as PCP - Cardiology (Cardiology) Brunetta Genera, MD as Consulting Physician (Hematology)  CHIEF COMPLAINTS/PURPOSE OF CONSULTATION:  F/u for Diffuse large B cell lymphoma of the small intestine  HISTORY OF PRESENTING ILLNESS:   Luke Mcbride is a wonderful 82 y.o. male who has been referred to [place] by Dr .Donato Heinz, MD and Mauricio Po MD for evaluation and management of newly diagnosed diffuse large B-cell lymphoma of the small bowel.  Patient has a history of multiple medical comorbidities as noted below including coronary artery disease status post PCI and CABG, chronic kidney disease stage III, Barrett's esophagus, hypertension, dyslipidemia, interstitial lung disease, GERD, vasculitis involving the lungs and kidneys on chronic Imuran therapy.  He was having upper abdominal discomfort for a few months with loss of about 15-20 pounds. He had a CT scan of the abdomen done by Dr. Havery Moros on 06/15/2016 which showed a 3.3 x 3.2 x 2.4 cm indeterminate lesion involving the left upper quadrant small bowel loops. No additional small bowel lesions noted. No ascites. No retroperitoneal mesenteric or pelvic lymphadenopathy noted.  Patient was seen by Dr. Serita Grammes and underwent a diagnostic laparoscopy with small bowel resection on 07/12/2016. Pathology showed that the lesion was consistent with diffuse large B-cell lymphoma 2 cm in length and 4 cm in width centrally ulcerated mass involving 100% of the bowel circumference. The tumor was about 1 cm thick invading through the entire wall and into the underlying mesenteric fat. The tumor also abuts and focally involve the serosa. No extramural satellite lymph nodes noted. Did not seem to involve the peri-intestinal lymph nodes. Tumor was noted to have a germinal center  phenotype.  Patient is healing well from surgery. Continues to be on Imuran 100 mg by mouth daily. Has not reported any other peripheral enlarged lymph nodes. He is starting to eat better.   INTERVAL HISTORY   Luke Mcbride is here for follow-up for his Diffuse large B cell lymphoma of the small intestine. The patient's last visit with [place] was on 12/28/17. He is accompanied today by his wife. The pt reports that he is doing well overall.   The pt reports that he has not developed any new concerns in the interim. He notes that he has enjoyed stable energy levels and has been eating well. He denies noticing any new lumps or bumps, denies fevers, chills, night sweats, abdominal pains, or unexpected weight loss.   The pt notes that he has returned to Imuran for his vasculitis. He also notes that his creatinine has been improving.   Lab results today (04/12/18) of CBC w/diff and CMP is as follows: all values are WNL except for RBC at 3.56, HGB at 11.8, HCT at 37.5, MCV at 105.3, RDW at 17.8, Lymphs abs at 400, Glucose at 111, BUN at 31, Creatinine at 2.10, GFR at 29. 04/12/18 LDH is normal at 166  On review of systems, pt reports moving his bowels well, eating well, stable energy levels, and denies fevers, chills, night sweats, abdominal pains, unexpected weight loss, new bone pains, new back pains, leg swelling, noticing any new lumps or bumps, changes in bowel habits, blood in the stools, black stools, and any other symptoms.    MEDICAL HISTORY:  Past Medical History:  Diagnosis Date  . ALLERGIC RHINITIS   . Anemia  hx  . Barrett's esophagus   . BOOP (bronchiolitis obliterans with organizing pneumonia) ([place])    a. s/p R VATS 2008.  [name] CAD (coronary artery disease)    a. 05/2000: NSTEMI/CABG x 6: LIMA->LAD, VG->D1, VG->OM1->2, VG->PDA->RPL;  b. 07/2007 MV: high lat infarct, no ischemia, EF 47%;  c. Cath/PCI: LM nl, LAD20p, 6m, D1 nl, D2 60-70ost, D3 nl, LCX 70ost, 141m, OM1/OM2 min irregs,  RCA 30 diff, PDA 99, LIMA->LAD atretic, VG->D1 100, VG->OM1->2 100, VG->PDA->RPL 90p (4.0x23 Vision BMS);  c. 07/2012 Echo: EF 55%, gr1 DD.  [name] Cataract   . CKD (chronic kidney disease), stage III ([place])    a. renal bx 2008: GLN with vasculitis  . Diffuse large B cell lymphoma ([place]) 08/07/2016  . GERD (gastroesophageal reflux disease)   . Glaucoma    a. Cannot see out of L eye.  . Hematuria    Microscopic  . Hyperglycemia    Patient reported while on prednisone, had to take insulin  . Hyperlipemia   . Hypertension   . Hypothyroidism   . ILD (interstitial lung disease) ([place])   . Iritis   . Local infection of skin and subcutaneous tissue   . Membranoproliferative nephritis   . Myocardial infarction ([place]) 2002  . Neutropenia, drug-induced ([place])   . Recurrent boils   . Residual foreign body in soft tissue   . Vasculitis ([place])    a.  pauciimmune vasculitis with renal involvement and hx of transient hemoptysis in the past with associated BOOP, 2008 (renal bx 2008: GLN with vasculitis). b. History of treatment with 2 cycles of Cytoxan and pheresis. H/o hemoptysis and pulm hemorrhage with 2nd cycle of cytoxan.    SURGICAL HISTORY: Past Surgical History:  Procedure Laterality Date  . BOWEL RESECTION N/A 07/12/2016   Procedure: SMALL BOWEL RESECTION;  Surgeon: Rolm Bookbinder, MD;  Location: [facility];  Service: General;  Laterality: N/A;  . CATARACT EXTRACTION Bilateral   . CORONARY ARTERY BYPASS GRAFT  2002  . CORONARY STENT INTERVENTION N/A 03/31/2017   Procedure: CORONARY STENT INTERVENTION;  Surgeon: Martinique, Peter M, MD;  Location: [facility];  Service: Cardiovascular;  Laterality: N/A;  . IR FLUORO GUIDE PORT INSERTION RIGHT  08/20/2016  . IR REMOVAL TUN ACCESS W/ PORT W/O FL MOD SED  01/20/2017  . IR US GUIDE VASC ACCESS RIGHT  08/20/2016  . LAPAROSCOPY N/A 07/12/2016   Procedure: LAPAROSCOPY DIAGNOSTIC;  Surgeon: Rolm Bookbinder, MD;  Location: [facility];  Service: General;  Laterality:  N/A;  . LEFT HEART CATHETERIZATION WITH CORONARY/GRAFT ANGIOGRAM N/A 07/26/2012   Procedure: LEFT HEART CATHETERIZATION WITH Beatrix Fetters;  Surgeon: Wellington Hampshire, MD;  Location: [facility];  Service: Cardiovascular;  Laterality: N/A;  . LUNG BIOPSY    . PERCUTANEOUS CORONARY STENT INTERVENTION (PCI-S)  07/26/2012   Procedure: PERCUTANEOUS CORONARY STENT INTERVENTION (PCI-S);  Surgeon: Wellington Hampshire, MD;  Location: [facility];  Service: Cardiovascular;;  . RENAL BIOPSY    . RIGHT/LEFT HEART CATH AND CORONARY/GRAFT ANGIOGRAPHY N/A 03/23/2017   Procedure: RIGHT/LEFT HEART CATH AND CORONARY/GRAFT ANGIOGRAPHY;  Surgeon: Martinique, Peter M, MD;  Location: [facility];  Service: Cardiovascular;  Laterality: N/A;  . TOE AMPUTATION  2009   hammer toe    SOCIAL HISTORY: Social History   Socioeconomic History  . Marital status: Married    Spouse name: Not on file  . Number of children: Not on file  . Years of education: Not on file  .  Highest education level: Not on file  Occupational History  . Occupation: [employer]  . Financial resource strain: Not on file  . Food insecurity:    Worry: Not on file    Inability: Not on file  . Transportation needs:    Medical: Not on file    Non-medical: Not on file  Tobacco Use  . Smoking status: Never Smoker  . Smokeless tobacco: Never Used  Substance and Sexual Activity  . Alcohol use: No  . Drug use: No  . Sexual activity: Not on file  Lifestyle  . Physical activity:    Days per week: Not on file    Minutes per session: Not on file  . Stress: Not on file  Relationships  . Social connections:    Talks on phone: Not on file    Gets together: Not on file    Attends religious service: Not on file    Active member of club or organization: Not on file    Attends meetings of clubs or organizations: Not on file    Relationship status: Not on file  . Intimate partner violence:    Fear of current or ex partner:  Not on file    Emotionally abused: Not on file    Physically abused: Not on file    Forced sexual activity: Not on file  Other Topics Concern  . Not on file  Social History Narrative   Regular exercise - yes      [facility] (07/12/16):   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  no pets currently. No bird or mold exposure.    FAMILY HISTORY: Family History  Problem Relation Age of Onset  . Heart disease Mother   . Diabetes Mother   . Prostate cancer Father   . Depression Other   . Diabetes Other   . Prostate cancer Other   . Colon polyps Neg Hx   . Colon cancer Neg Hx   . Rectal cancer Neg Hx   . Stomach cancer Neg Hx   . Esophageal cancer Neg Hx     ALLERGIES:  has No Known Allergies.  MEDICATIONS:  Current Outpatient Medications  Medication Sig Dispense Refill  . acetaminophen (TYLENOL) 500 MG tablet Take 500 mg by mouth daily as needed for moderate pain or headache.     [name] aspirin EC 81 MG EC tablet Take 1 tablet (81 mg total) by mouth daily. 30 tablet   . azaTHIOprine (IMURAN) 50 MG tablet Take 2 tablets (100 mg total) by mouth daily. 90 tablet 1  . clopidogrel (PLAVIX) 75 MG tablet TAKE 1 TABLET BY MOUTH  DAILY 90 tablet 3  . dorzolamide-timolol (COSOPT) 22.3-6.8 MG/ML ophthalmic solution Place 1 drop into both eyes 2 (two) times daily.     . finasteride (PROSCAR) 5 MG tablet Take 5 mg by mouth at bedtime.     . furosemide (LASIX) 20 MG tablet TAKE 1 TABLET BY MOUTH  DAILY 90 tablet 2  . levothyroxine (SYNTHROID, LEVOTHROID) 100 MCG tablet Take 100 mcg by mouth daily before breakfast.     . metoprolol succinate (TOPROL-XL) 25 MG 24 hr tablet TAKE 1 TABLET BY MOUTH  DAILY 90 tablet 3  . Multiple Vitamin (MULTIVITAMIN) tablet Take 1 tablet by mouth daily.      . nitroGLYCERIN (NITROSTAT) 0.4 MG SL tablet DISSOLVE 1 TABLET UNDER THE TONGUE EVERY 5 MINUTES AS  NEEDED FOR CHEST PAIN.  DO  NOT  EXCEED A TOTAL OF 3  DOSES IN 15 MINUTES. 100 tablet 0    . pantoprazole (PROTONIX) 40 MG tablet TAKE 1 TABLET BY MOUTH  DAILY 90 tablet 2  . rosuvastatin (CRESTOR) 10 MG tablet Take 10 mg every morning by mouth.    . Tamsulosin HCl (FLOMAX) 0.4 MG CAPS Take 0.4 mg by mouth at bedtime.      No current facility-administered medications for this visit.    Facility-Administered Medications Ordered in Other Visits  Medication Dose Route Frequency Provider Last Rate Last Dose  . sodium chloride flush (NS) 0.9 % injection 10 mL  10 mL Intracatheter PRN Brunetta Genera, MD   10 mL at 08/24/16 1653  . sodium chloride flush (NS) 0.9 % injection 10 mL  10 mL Intracatheter PRN Brunetta Genera, MD   10 mL at 10/07/16 1555    REVIEW OF SYSTEMS:   A 10+ POINT REVIEW OF SYSTEMS WAS OBTAINED including neurology, dermatology, psychiatry, cardiac, respiratory, lymph, extremities, GI, GU, Musculoskeletal, constitutional, breasts, reproductive, HEENT.  All pertinent positives are noted in the HPI.  All others are negative.   PHYSICAL EXAMINATION: ECOG PERFORMANCE STATUS: 2 - Symptomatic, <50% confined to bed  Vitals:   04/12/18 1123  BP: 132/85  Pulse: 85  Resp: 18  Temp: 98.2 F (36.8 C)  SpO2: 100%   Filed Weights   04/12/18 1123  Weight: 224 lb 12.8 oz (102 kg)   .Body mass index is 30.49 kg/m.  GENERAL:alert, in no acute distress and comfortable SKIN: no acute rashes, no significant lesions EYES: conjunctiva are pink and non-injected, sclera anicteric OROPHARYNX: MMM, no exudates, no oropharyngeal erythema or ulceration NECK: supple, no JVD LYMPH:  no palpable lymphadenopathy in the cervical, axillary or inguinal regions LUNGS: clear to auscultation b/l with normal respiratory effort HEART: regular rate & rhythm ABDOMEN:  normoactive bowel sounds , non tender, not distended. No palpable hepatosplenomegaly.  Extremity: 1+ pitting pedal edema PSYCH: alert & oriented x 3 with fluent speech NEURO: no focal motor/sensory deficits    LABORATORY DATA:  I have reviewed the data as listed . CBC Latest Ref Rng & Units 04/12/2018 01/30/2018 01/29/2018  WBC 4.0 - 10.5 K/uL 5.0 7.7 8.4  Hemoglobin 13.0 - 17.0 g/dL 11.8(L) 11.2(L) 11.1(L)  Hematocrit 39.0 - 52.0 % 37.5(L) 36.6(L) 37.3(L)  Platelets 150 - 400 K/uL 191 207 222   . CMP Latest Ref Rng & Units 04/12/2018 01/30/2018 01/29/2018  Glucose 70 - 99 mg/dL 111(H) 117(H) 131(H)  BUN 8 - 23 mg/dL 31(H) 36(H) 29(H)  Creatinine 0.61 - 1.24 mg/dL 2.10(H) 2.38(H) 2.21(H)  Sodium 135 - 145 mmol/L 143 141 140  Potassium 3.5 - 5.1 mmol/L 4.3 4.3 4.6  Chloride 98 - 111 mmol/L 106 106 107  CO2 22 - 32 mmol/L 26 23 23   Calcium 8.9 - 10.3 mg/dL 9.0 9.3 9.1  Total Protein 6.5 - 8.1 g/dL 7.0 - -  Total Bilirubin 0.3 - 1.2 mg/dL 0.6 - -  Alkaline Phos 38 - 126 U/L 59 - -  AST 15 - 41 U/L 16 - -  ALT 0 - 44 U/L 12 - -    Lab Results  Component Value Date   LDH 166 04/12/2018     RADIOGRAPHIC STUDIES: I have personally reviewed the radiological images as listed and agreed with the findings in the report.  Nm Pet Image Restag (ps) Skull Base To Thigh  Result Date: 10/22/2016 CLINICAL DATA:  Subsequent treatment strategy for diffuse  large B-cell non-Hodgkin's lymphoma. EXAM: NUCLEAR MEDICINE PET SKULL BASE TO THIGH TECHNIQUE: 11.15 mCi F-18 FDG was injected intravenously. Full-ring PET imaging was performed from the skull base to thigh after the radiotracer. CT data was obtained and used for attenuation correction and anatomic localization. FASTING BLOOD GLUCOSE:  Value: 102 mg/dl COMPARISON:  None. FINDINGS: NECK No hypermetabolic lymph nodes in the neck. CHEST The FDG avid right axillary lymph nodes and epicardial lymph nodes have resolved in the interval. The focal hypermetabolic activity in the left ventricular myocardium described on the previous study is unchanged. No new abnormalities in the chest. ABDOMEN/PELVIS Significant improvement in the abdomen. No definitive FDG avid  disease remains in the liver or spleen. The adenopathy in the abdomen has resolved. Minimal uptake in the normal appearing left adrenal gland is of no significance. No abnormal uptake seen in the soft tissues of the pelvis. The omental nodularity seen previously has resolved. There is increased attenuation in the fat associated with the anterior ventral hernia with low level uptake, likely inflammatory. The maximum SUV is measured on image 161 is 3.7. SKELETON There has been significant overall improvement in the bones as well. Uptake in the anterior left acetabulum has significantly diminished with a maximum SUV of 3.4 today versus 29.4 previously. The left iliac uptake seen previously is no longer significantly FDG avid. There is mild focal uptake in the posterior left iliac bone on series 4, image 166 which was not seen previously and demonstrates a maximum SUV of 4.26 today. No other bony abnormalities are identified. IMPRESSION: 1. Significant interval improvement. No FDG avid disease remains in the soft tissues of the neck, chest, abdomen, or pelvis. Very mild uptake remains in the known left anterior acetabular lesion. Mild new uptake in the posterior left iliac bone is nonspecific but could represent mild involvement. Recommend attention on follow-up. 2. Increased attenuation and mild uptake in the fat of a lower midline ventral hernia is likely inflammatory. Recommend attention on follow-up. 3. No other interval change. Electronically Signed   By: Dorise Bullion III M.D   On: 10/22/2016 15:33   PET/CT 08/06/2016: IMPRESSION: 1. In addition to hypermetabolic nodal involvement of the right axilla, pericardial space, porta hepatis/mesenteric root, and retroperitoneum, there is extensive involvement of the liver as well as peritoneal involvement along the liver surface, splenic surface, and upper omentum. 2. Hypermetabolic bony lesions in the left pelvis compatible with malignant involvement. 3.  There is a focus of hypermetabolic activity along the anterolateral wall of the left ventricle. Normally such findings turn out to simply be hypermetabolic myocardium. Strictly speaking given how focal this appears I cannot completely exclude the possibility of tumor involvement within or along the myocardium.   Electronically Signed   By: Van Clines M.D.   On: 08/06/2016 12:48   PET/CT 12/29/2016  IMPRESSION: 1. No evidence of residual or recurrent hypermetabolic lymphoma. 2.  Aortic Atherosclerosis (ICD10-I70.0). 3. Dilatation of the infrarenal aorta and pelvic vasculature, as detailed above.   Electronically Signed   By: Abigail Miyamoto M.D.   On: 12/29/2016 15:17   ASSESSMENT & PLAN:   82 y.o.  male with multiple medical co-morbidities including cardiac co-morbidities, IPF, vascular on chronic immunosupression with    1) Stage IV Diffuse large B-cell lymphoma involving the small intestine (germinal cell phenotype) with complete thickness involvement of bowel and abutting serosa.  Initial PET/CT scan showed extensive involvement with DLBCL, hypermetabolic nodal involvement of the right axilla, pericardial space, porta hepatis/mesenteric root,  and retroperitoneum, there is extensive involvement of the liver as well as peritoneal involvement along the liver surface, splenic surface, and upper omentum. 2. Hypermetabolic bony lesions in the left pelvis compatible with malignant involvement. 3. There is a focus of hypermetabolic activity along the anterolateral wall of the left ventricle.   s/p 6 cycles of R-CEOP .   PET/CT scan on 10/22/2016 after 3 cycles of chemotherapy shows excellent response to treatment. PET/CT scan on 12/29/2016 after 6 cycles of treatment shows No evidence of residual or recurrent hypermetabolic lymphoma  A)Sh/oneutropenic fever and E.coli/pseudomonal bacteremia/HCAP after the first cycle of treatment.  Significant chemotherapy related  thrombocytopenia - now resolved. B) h/o hospitalization for perforation diverticulitis - no issues currently  2) Chronic kidney disease stage III baseline creatinine about 2 to 2.5.  3) Coronary disease status post PCI and CABG - had a myocardial perfusion imaging scan on 06/30/2016 that showed a moderately decreased ejection fraction of 30-44%- following with cardiology for management. Was having some SOB/DOE and had cardiac cath with PCI to mid LAD on11/15/2018. 4) History of pulmonary and renal vasculitis previously on chronic Imuran. Now off Imuran considering chemotherapy and Rituxan use. 5) Neutropenia -resolved  6) Thrombocytopenia due to chemotherapy -resolved with lymphocyte count at 0.8 7) Anemia - much improved hgb is up to 12.1   PLAN:  -Discussed pt labwork today, 04/12/18; HGB improved to 11.8, Lymphocytes at 400, PLT normal at 191k, LDH WNL at 166. Creatinine improved to 2.10 -Continue follow up with nephrologist Dr. Servando Salina regarding vasculitis and Imuran  -The pt shows no clinical or lab progression or return of his DLBCL at this time.  -No indication for further treatment at this time.   -Recommended continued f/u with dermatology for screening/management of his recurrent cutaneous SCC -Will see the pt back in 3 months     8) Arthritis, especially in right knee -Week of 09/18/17 he had fluid drawn form right knee and given cortisone shot by PCP -Encouraged him to be active with walking or PT            RTC with Dr Irene Limbo with labs in 3 months   All of the patients questions were answered with apparent satisfaction. The patient knows to call the clinic with any problems, questions or concerns.   The total time spent in the appt was 20 minutes and more than 50% was on counseling and direct patient cares.     Sullivan Lone MD [place] AAHIVMS [facility] Hematology/Oncology Physician [facility]  (Office):       [phone] (Work cell):  [phone] (Fax):            [phone]  I, Baldwin Jamaica, am acting as a scribe for Dr. Sullivan Lone.   .I have reviewed the above documentation for accuracy and completeness, and I agree with the above. Brunetta Genera MD

## 2018-04-12 ENCOUNTER — Inpatient Hospital Stay (HOSPITAL_BASED_OUTPATIENT_CLINIC_OR_DEPARTMENT_OTHER): Payer: Medicare Other | Admitting: Hematology

## 2018-04-12 ENCOUNTER — Telehealth: Payer: Self-pay | Admitting: Hematology

## 2018-04-12 ENCOUNTER — Inpatient Hospital Stay: Payer: Medicare Other | Attending: Hematology

## 2018-04-12 VITALS — BP 132/85 | HR 85 | Temp 98.2°F | Resp 18 | Ht 72.0 in | Wt 224.8 lb

## 2018-04-12 DIAGNOSIS — D649 Anemia, unspecified: Secondary | ICD-10-CM | POA: Diagnosis not present

## 2018-04-12 DIAGNOSIS — N183 Chronic kidney disease, stage 3 (moderate): Secondary | ICD-10-CM | POA: Insufficient documentation

## 2018-04-12 DIAGNOSIS — Z8249 Family history of ischemic heart disease and other diseases of the circulatory system: Secondary | ICD-10-CM

## 2018-04-12 DIAGNOSIS — Z7982 Long term (current) use of aspirin: Secondary | ICD-10-CM

## 2018-04-12 DIAGNOSIS — C8339 Diffuse large B-cell lymphoma, extranodal and solid organ sites: Secondary | ICD-10-CM

## 2018-04-12 DIAGNOSIS — C8330 Diffuse large B-cell lymphoma, unspecified site: Secondary | ICD-10-CM

## 2018-04-12 DIAGNOSIS — Z8042 Family history of malignant neoplasm of prostate: Secondary | ICD-10-CM | POA: Insufficient documentation

## 2018-04-12 DIAGNOSIS — Z79899 Other long term (current) drug therapy: Secondary | ICD-10-CM | POA: Insufficient documentation

## 2018-04-12 LAB — CBC WITH DIFFERENTIAL/PLATELET
Abs Immature Granulocytes: 0.02 10*3/uL (ref 0.00–0.07)
BASOS ABS: 0 10*3/uL (ref 0.0–0.1)
BASOS PCT: 1 %
EOS ABS: 0.2 10*3/uL (ref 0.0–0.5)
EOS PCT: 4 %
HEMATOCRIT: 37.5 % — AB (ref 39.0–52.0)
Hemoglobin: 11.8 g/dL — ABNORMAL LOW (ref 13.0–17.0)
IMMATURE GRANULOCYTES: 0 %
Lymphocytes Relative: 8 %
Lymphs Abs: 0.4 10*3/uL — ABNORMAL LOW (ref 0.7–4.0)
MCH: 33.1 pg (ref 26.0–34.0)
MCHC: 31.5 g/dL (ref 30.0–36.0)
MCV: 105.3 fL — AB (ref 80.0–100.0)
Monocytes Absolute: 0.6 10*3/uL (ref 0.1–1.0)
Monocytes Relative: 11 %
Neutro Abs: 3.8 10*3/uL (ref 1.7–7.7)
Neutrophils Relative %: 76 %
PLATELETS: 191 10*3/uL (ref 150–400)
RBC: 3.56 MIL/uL — ABNORMAL LOW (ref 4.22–5.81)
RDW: 17.8 % — AB (ref 11.5–15.5)
WBC: 5 10*3/uL (ref 4.0–10.5)
nRBC: 0 % (ref 0.0–0.2)

## 2018-04-12 LAB — CMP (CANCER CENTER ONLY)
ALBUMIN: 3.5 g/dL (ref 3.5–5.0)
ALK PHOS: 59 U/L (ref 38–126)
ALT: 12 U/L (ref 0–44)
ANION GAP: 11 (ref 5–15)
AST: 16 U/L (ref 15–41)
BUN: 31 mg/dL — ABNORMAL HIGH (ref 8–23)
CALCIUM: 9 mg/dL (ref 8.9–10.3)
CO2: 26 mmol/L (ref 22–32)
Chloride: 106 mmol/L (ref 98–111)
Creatinine: 2.1 mg/dL — ABNORMAL HIGH (ref 0.61–1.24)
GFR, EST AFRICAN AMERICAN: 33 mL/min — AB (ref >60)
GFR, Estimated: 29 mL/min — ABNORMAL LOW (ref >60)
GLUCOSE: 111 mg/dL — AB (ref 70–99)
POTASSIUM: 4.3 mmol/L (ref 3.5–5.1)
SODIUM: 143 mmol/L (ref 135–145)
TOTAL PROTEIN: 7 g/dL (ref 6.5–8.1)
Total Bilirubin: 0.6 mg/dL (ref 0.3–1.2)

## 2018-04-12 LAB — LACTATE DEHYDROGENASE: LDH: 166 U/L (ref 98–192)

## 2018-04-12 NOTE — Telephone Encounter (Signed)
Printed calendar and avs. °

## 2018-04-26 NOTE — Progress Notes (Signed)
Cardiology Office Note   Date:  04/28/2018   ID:  Luke Mcbride, DOB [birth year], MRN [id number]  PCP:  Donato Heinz, MD  Cardiologist:  Dr. [name]    No chief complaint on file.     History of Present Illness: Luke Mcbride is a 82 y.o. male who presents for f/u CAD/CABG 2002, DM, HTN, HLD and CKD  He was admitted 07/2012 with NSTEMI and underwent BMS to the SVR-PDA/PL. His LIMA to LAD was atretic but disease in the LAD was moderate. SVG-OM and SVG to diagonal were known to be occluded.  06/21/16 myovue scar no ischemia low risk  However he was admitted for angina and had cath 03/23/17 with new native mid LAD lesion that was stented due to atretic LIMA Echo with EF 35-40%   Diagnosed with Stage IV Diffuse large B-cell lymphoma involving the small intestine.. He had 6 inchesof his colon resected. Following surgery he had 6 rounds of chemo. F/u PET scan and was told he was in remission.   Cath 04/02/17   1. Severe 3 vessel obstructive CAD. 2. Patent SVG to PDA/PLOM. Stent in proximal SVG is patent 3. Occluded SVG to the first diagonal. The first diagonal is small in caliber 4. Occluded SVG to OM 5. Atretic LIMA to the LAD 6. Normal LVEDP 7. Normal right heart pressures.   Activity limited by right knee arthritis and neuropathy likely from chemo therapy   D/c from hospital 01/30/18  Volume overload.  5 day history of dyspnea orthopnea BNP 427 Nephrologist had held his diuretic  For low BP even though BP at home was ok TTE reviewed from 01/30/18 and stable with EF 35-40% trivial MR   Still with low appetite and unintentional weight loss LE edema better Last visit considered future use of Bidil if BP elevated but ok today   Lots of family in/out of the house including daughter in [place]   Past Medical History:  Diagnosis Date  . ALLERGIC RHINITIS   . Anemia    hx  . Barrett's esophagus   . BOOP (bronchiolitis obliterans with organizing pneumonia) ([place])    a. s/p  R VATS 2008.  [name] CAD (coronary artery disease)    a. 05/2000: NSTEMI/CABG x 6: LIMA->LAD, VG->D1, VG->OM1->2, VG->PDA->RPL;  b. 07/2007 MV: high lat infarct, no ischemia, EF 47%;  c. Cath/PCI: LM nl, LAD20p, 35m, D1 nl, D2 60-70ost, D3 nl, LCX 70ost, 129m, OM1/OM2 min irregs, RCA 30 diff, PDA 99, LIMA->LAD atretic, VG->D1 100, VG->OM1->2 100, VG->PDA->RPL 90p (4.0x23 Vision BMS);  c. 07/2012 Echo: EF 55%, gr1 DD.  [name] Cataract   . CKD (chronic kidney disease), stage III ([place])    a. renal bx 2008: GLN with vasculitis  . Diffuse large B cell lymphoma ([place]) 08/07/2016  . GERD (gastroesophageal reflux disease)   . Glaucoma    a. Cannot see out of L eye.  . Hematuria    Microscopic  . Hyperglycemia    Patient reported while on prednisone, had to take insulin  . Hyperlipemia   . Hypertension   . Hypothyroidism   . ILD (interstitial lung disease) ([place])   . Iritis   . Local infection of skin and subcutaneous tissue   . Membranoproliferative nephritis   . Myocardial infarction ([place]) 2002  . Neutropenia, drug-induced ([place])   . Recurrent boils   . Residual foreign body in soft tissue   . Vasculitis ([place])    a.  pauciimmune vasculitis with renal involvement and  hx of transient hemoptysis in the past with associated BOOP, 2008 (renal bx 2008: GLN with vasculitis). b. History of treatment with 2 cycles of Cytoxan and pheresis. H/o hemoptysis and pulm hemorrhage with 2nd cycle of cytoxan.    Past Surgical History:  Procedure Laterality Date  . BOWEL RESECTION N/A 07/12/2016   Procedure: SMALL BOWEL RESECTION;  Surgeon: Rolm Bookbinder, MD;  Location: [facility];  Service: General;  Laterality: N/A;  . CATARACT EXTRACTION Bilateral   . CORONARY ARTERY BYPASS GRAFT  2002  . CORONARY STENT INTERVENTION N/A 03/31/2017   Procedure: CORONARY STENT INTERVENTION;  Surgeon: Martinique, Alajiah Dutkiewicz M, MD;  Location: [facility];  Service: Cardiovascular;  Laterality: N/A;  . IR FLUORO GUIDE PORT INSERTION RIGHT   08/20/2016  . IR REMOVAL TUN ACCESS W/ PORT W/O FL MOD SED  01/20/2017  . IR US GUIDE VASC ACCESS RIGHT  08/20/2016  . LAPAROSCOPY N/A 07/12/2016   Procedure: LAPAROSCOPY DIAGNOSTIC;  Surgeon: Rolm Bookbinder, MD;  Location: [facility];  Service: General;  Laterality: N/A;  . LEFT HEART CATHETERIZATION WITH CORONARY/GRAFT ANGIOGRAM N/A 07/26/2012   Procedure: LEFT HEART CATHETERIZATION WITH Beatrix Fetters;  Surgeon: Wellington Hampshire, MD;  Location: [facility];  Service: Cardiovascular;  Laterality: N/A;  . LUNG BIOPSY    . PERCUTANEOUS CORONARY STENT INTERVENTION (PCI-S)  07/26/2012   Procedure: PERCUTANEOUS CORONARY STENT INTERVENTION (PCI-S);  Surgeon: Wellington Hampshire, MD;  Location: [facility];  Service: Cardiovascular;;  . RENAL BIOPSY    . RIGHT/LEFT HEART CATH AND CORONARY/GRAFT ANGIOGRAPHY N/A 03/23/2017   Procedure: RIGHT/LEFT HEART CATH AND CORONARY/GRAFT ANGIOGRAPHY;  Surgeon: Martinique, Marry Kusch M, MD;  Location: [facility];  Service: Cardiovascular;  Laterality: N/A;  . TOE AMPUTATION  2009   hammer toe     Current Outpatient Medications  Medication Sig Dispense Refill  . acetaminophen (TYLENOL) 500 MG tablet Take 500 mg by mouth daily as needed for moderate pain or headache.     [name] aspirin EC 81 MG EC tablet Take 1 tablet (81 mg total) by mouth daily. 30 tablet   . azaTHIOprine (IMURAN) 50 MG tablet Take 2 tablets (100 mg total) by mouth daily. 90 tablet 1  . clopidogrel (PLAVIX) 75 MG tablet TAKE 1 TABLET BY MOUTH  DAILY 90 tablet 3  . dorzolamide-timolol (COSOPT) 22.3-6.8 MG/ML ophthalmic solution Place 1 drop into both eyes 2 (two) times daily.     . finasteride (PROSCAR) 5 MG tablet Take 5 mg by mouth at bedtime.     . furosemide (LASIX) 20 MG tablet TAKE 1 TABLET BY MOUTH  DAILY 90 tablet 2  . levothyroxine (SYNTHROID, LEVOTHROID) 100 MCG tablet Take 100 mcg by mouth daily before breakfast.     . metoprolol succinate (TOPROL-XL) 25 MG 24 hr tablet TAKE 1 TABLET BY MOUTH   DAILY 90 tablet 3  . Multiple Vitamin (MULTIVITAMIN) tablet Take 1 tablet by mouth daily.      . nitroGLYCERIN (NITROSTAT) 0.4 MG SL tablet DISSOLVE 1 TABLET UNDER THE TONGUE EVERY 5 MINUTES AS  NEEDED FOR CHEST PAIN.  DO  NOT EXCEED A TOTAL OF 3  DOSES IN 15 MINUTES. 100 tablet 0  . pantoprazole (PROTONIX) 40 MG tablet TAKE 1 TABLET BY MOUTH  DAILY 90 tablet 2  . rosuvastatin (CRESTOR) 10 MG tablet Take 10 mg every morning by mouth.    . Tamsulosin HCl (FLOMAX) 0.4 MG CAPS Take 0.4 mg by mouth at bedtime.  No current facility-administered medications for this visit.    Facility-Administered Medications Ordered in Other Visits  Medication Dose Route Frequency Provider Last Rate Last Dose  . sodium chloride flush (NS) 0.9 % injection 10 mL  10 mL Intracatheter PRN Brunetta Genera, MD   10 mL at 08/24/16 1653  . sodium chloride flush (NS) 0.9 % injection 10 mL  10 mL Intracatheter PRN Brunetta Genera, MD   10 mL at 10/07/16 1555    Allergies:   Patient has no known allergies.    Social History:  The patient  reports that he has never smoked. He has never used smokeless tobacco. He reports that he does not drink alcohol or use drugs.   Family History:  The patient's family history includes Depression in an other family member; Diabetes in his mother and another family member; Heart disease in his mother; Prostate cancer in his father and another family member.    ROS:  General:no colds or fevers, no weight changes Skin:no rashes or ulcers HEENT:no blurred vision, no congestion CV:see HPI PUL:see HPI GI:no diarrhea constipation or melena, no indigestion GU:no hematuria, no dysuria MS:no joint pain, no claudication Neuro:no syncope, no lightheadedness Endo:no diabetes, + thyroid disease  Wt Readings from Last 3 Encounters:  04/28/18 227 lb (103 kg)  04/12/18 224 lb 12.8 oz (102 kg)  02/08/18 224 lb 3.2 oz (101.7 kg)     PHYSICAL EXAM: VS:  BP (!) 146/84   Pulse 77    Ht 6' (1.829 m)   Wt 227 lb (103 kg)   BMI 30.79 kg/m  , BMI Body mass index is 30.79 kg/m. Affect appropriate Chronically ill white male  HEENT: normal Neck supple with no adenopathy JVP normal no bruits no thyromegaly Lungs clear with no wheezing and good diaphragmatic motion Heart:  S1/S2 no murmur, no rub, gallop or click PMI normal Abdomen: benighn, BS positve, no tenderness, no AAA no bruit.  No HSM or HJR Distal pulses intact with no bruits Plus one bilateral  Edema a bit worse on right  Neuro non-focal Skin warm and dry No muscular weakness    EKG: 04/01/17 SR nonspecific ST/T wave changes    Recent Labs: 01/29/2018: B Natriuretic Peptide 427.2 04/12/2018: ALT 12; BUN 31; Creatinine 2.10; Hemoglobin 11.8; Platelets 191; Potassium 4.3; Sodium 143    Lipid Panel    Component Value Date/Time   CHOL 157 02/11/2014 0733   TRIG 150.0 (H) 02/11/2014 0733   HDL 36.30 (L) 02/11/2014 0733   CHOLHDL 4 02/11/2014 0733   VLDL 30.0 02/11/2014 0733   LDLCALC 91 02/11/2014 0733   LDLDIRECT 221.7 05/23/2007 0000       Other studies Reviewed: Additional studies/ records that were reviewed today include:  Echo 01/18/17 Study Conclusions  - Left ventricle: The cavity size was normal. There was mild focal   basal hypertrophy of the septum. Systolic function was moderately   reduced. The estimated ejection fraction was in the range of 35%   to 40%. There is akinesis of the inferolateral myocardium.   Doppler parameters are consistent with abnormal left ventricular   relaxation (grade 1 diastolic dysfunction). - Mitral valve: Calcified annulus. There was mild regurgitation. - Left atrium: The atrium was mildly dilated. - Atrial septum: There was an atrial septal aneurysm.  Impressions:  - Limited study with definity; full doppler not performed; akinesis   of the inferolateral wall with overall moderately reduced LV   systolic function; mild diastolic dysfunction;  mild MR; mild LAE.  ECHO 12/2016  Study Conclusions  - Left ventricle: The cavity size was mildly dilated. There was   mild focal basal hypertrophy of the septum. Systolic function was   mildly to moderately reduced. The estimated ejection fraction was   in the range of 40% to 45%. Mild diffuse hypokinesis. There was   fusion of early and atrial contributions to ventricular filling.   The study is not technically sufficient to allow evaluation of LV   diastolic function. - Left atrium: The atrium was mildly dilated. - Right ventricle: The cavity size was mildly dilated. Wall   thickness was normal. - Recommendations: Recommend limited repeat study with definity   contrast to assess wall motion and more accurate assessment of   EF.  Recommendations:  Recommend limited repeat study with definity contrast to assess wall motion and more accurate assessment of EF.   NUC study  06/30/16 study Highlights     The left ventricular ejection fraction is moderately decreased (30-44%).  Nuclear stress EF: 40%.  There was no ST segment deviation noted during stress.  No T wave inversion was noted during stress.  Defect 1: There is a large defect of severe severity present in the basal inferolateral, basal anterolateral, mid inferolateral and mid anterolateral location.  Findings consistent with prior myocardial infarction.  This is an intermediate risk study.   Intermediate risk stress nuclear study with a large scar in the left circumflex coronary artery distribution moderate reduction in left ventricular systolic function.     ASSESSMENT AND PLAN:   1. CAD/CABG:  Stable no angina DES to native LAD 04/03/17 DAT  2. Lymphoma:  Diffuse B cell intestinal post surgical resection and chemo Rx in remission Seen by oncology 04/12/18 improved counts no clinical or lab progression no active Rx at this time   3. Edema: from arthritis and previous vein stripping CABG duplex with no DVT     4. CRF:  Diuretics cut back by nephrology  Cr 2.1 04/12/18 f/u Dr Azzie Roup  5. Thyroid:  On synthroid replacement f/u primary TSH   6. CHF: Hospitalized 01/29/18 after renal stopped diuretics. TTE with stable EF 35-40% cleared quickly With iv lasix On PO lasix 20 mg unable to use ACE/ARB due to renal failure    F/u with me in 6 months   Jenkins Rouge

## 2018-04-28 ENCOUNTER — Ambulatory Visit (INDEPENDENT_AMBULATORY_CARE_PROVIDER_SITE_OTHER): Payer: Medicare Other | Admitting: Cardiovascular Disease

## 2018-04-28 VITALS — BP 146/84 | HR 77 | Ht 72.0 in | Wt 227.0 lb

## 2018-04-28 DIAGNOSIS — I2583 Coronary atherosclerosis due to lipid rich plaque: Secondary | ICD-10-CM

## 2018-04-28 DIAGNOSIS — I2510 Atherosclerotic heart disease of native coronary artery without angina pectoris: Secondary | ICD-10-CM | POA: Diagnosis not present

## 2018-04-28 NOTE — Patient Instructions (Signed)

## 2018-05-03 DIAGNOSIS — R05 Cough: Secondary | ICD-10-CM | POA: Diagnosis not present

## 2018-05-03 DIAGNOSIS — E1122 Type 2 diabetes mellitus with diabetic chronic kidney disease: Secondary | ICD-10-CM | POA: Diagnosis not present

## 2018-05-03 DIAGNOSIS — I129 Hypertensive chronic kidney disease with stage 1 through stage 4 chronic kidney disease, or unspecified chronic kidney disease: Secondary | ICD-10-CM | POA: Diagnosis not present

## 2018-05-03 DIAGNOSIS — R809 Proteinuria, unspecified: Secondary | ICD-10-CM | POA: Diagnosis not present

## 2018-05-03 DIAGNOSIS — N179 Acute kidney failure, unspecified: Secondary | ICD-10-CM | POA: Diagnosis not present

## 2018-05-03 DIAGNOSIS — J42 Unspecified chronic bronchitis: Secondary | ICD-10-CM | POA: Diagnosis not present

## 2018-05-03 DIAGNOSIS — N057 Unspecified nephritic syndrome with diffuse crescentic glomerulonephritis: Secondary | ICD-10-CM | POA: Diagnosis not present

## 2018-05-03 DIAGNOSIS — N058 Unspecified nephritic syndrome with other morphologic changes: Secondary | ICD-10-CM | POA: Diagnosis not present

## 2018-05-03 DIAGNOSIS — R109 Unspecified abdominal pain: Secondary | ICD-10-CM | POA: Diagnosis not present

## 2018-05-03 DIAGNOSIS — N183 Chronic kidney disease, stage 3 (moderate): Secondary | ICD-10-CM | POA: Diagnosis not present

## 2018-05-03 DIAGNOSIS — N400 Enlarged prostate without lower urinary tract symptoms: Secondary | ICD-10-CM | POA: Diagnosis not present

## 2018-05-03 DIAGNOSIS — I2510 Atherosclerotic heart disease of native coronary artery without angina pectoris: Secondary | ICD-10-CM | POA: Diagnosis not present

## 2018-05-19 DIAGNOSIS — D0439 Carcinoma in situ of skin of other parts of face: Secondary | ICD-10-CM | POA: Diagnosis not present

## 2018-05-19 DIAGNOSIS — X32XXXD Exposure to sunlight, subsequent encounter: Secondary | ICD-10-CM | POA: Diagnosis not present

## 2018-05-19 DIAGNOSIS — L570 Actinic keratosis: Secondary | ICD-10-CM | POA: Diagnosis not present

## 2018-06-02 ENCOUNTER — Other Ambulatory Visit: Payer: Self-pay | Admitting: Cardiovascular Disease

## 2018-06-05 DIAGNOSIS — N183 Chronic kidney disease, stage 3 (moderate): Secondary | ICD-10-CM | POA: Diagnosis not present

## 2018-06-13 DIAGNOSIS — X32XXXD Exposure to sunlight, subsequent encounter: Secondary | ICD-10-CM | POA: Diagnosis not present

## 2018-06-13 DIAGNOSIS — Z85828 Personal history of other malignant neoplasm of skin: Secondary | ICD-10-CM | POA: Diagnosis not present

## 2018-06-13 DIAGNOSIS — Z08 Encounter for follow-up examination after completed treatment for malignant neoplasm: Secondary | ICD-10-CM | POA: Diagnosis not present

## 2018-06-13 DIAGNOSIS — L570 Actinic keratosis: Secondary | ICD-10-CM | POA: Diagnosis not present

## 2018-06-26 DIAGNOSIS — H401211 Low-tension glaucoma, right eye, mild stage: Secondary | ICD-10-CM | POA: Diagnosis not present

## 2018-06-26 DIAGNOSIS — H44512 Absolute glaucoma, left eye: Secondary | ICD-10-CM | POA: Diagnosis not present

## 2018-06-26 DIAGNOSIS — H401131 Primary open-angle glaucoma, bilateral, mild stage: Secondary | ICD-10-CM | POA: Diagnosis not present

## 2018-06-26 DIAGNOSIS — H401133 Primary open-angle glaucoma, bilateral, severe stage: Secondary | ICD-10-CM | POA: Diagnosis not present

## 2018-06-26 DIAGNOSIS — H40023 Open angle with borderline findings, high risk, bilateral: Secondary | ICD-10-CM | POA: Diagnosis not present

## 2018-07-13 NOTE — Progress Notes (Signed)
HEMATOLOGY/ONCOLOGY CLINIC NOTE  Date of Service: 07/13/18  Patient Care Team: Donato Heinz, MD as PCP - General (Nephrology) [name], MD as PCP - Cardiology (Cardiology) Brunetta Genera, MD as Consulting Physician (Hematology)  CHIEF COMPLAINTS/PURPOSE OF CONSULTATION:  F/u for Diffuse large B cell lymphoma of the small intestine  HISTORY OF PRESENTING ILLNESS:   Luke Mcbride is a wonderful 83 y.o. male who has been referred to [place] by Dr .Donato Heinz, MD and Mauricio Po MD for evaluation and management of newly diagnosed diffuse large B-cell lymphoma of the small bowel.  Patient has a history of multiple medical comorbidities as noted below including coronary artery disease status post PCI and CABG, chronic kidney disease stage III, Barrett's esophagus, hypertension, dyslipidemia, interstitial lung disease, GERD, vasculitis involving the lungs and kidneys on chronic Imuran therapy.  He was having upper abdominal discomfort for a few months with loss of about 15-20 pounds. He had a CT scan of the abdomen done by Dr. Havery Moros on 06/15/2016 which showed a 3.3 x 3.2 x 2.4 cm indeterminate lesion involving the left upper quadrant small bowel loops. No additional small bowel lesions noted. No ascites. No retroperitoneal mesenteric or pelvic lymphadenopathy noted.  Patient was seen by Dr. Serita Grammes and underwent a diagnostic laparoscopy with small bowel resection on 07/12/2016. Pathology showed that the lesion was consistent with diffuse large B-cell lymphoma 2 cm in length and 4 cm in width centrally ulcerated mass involving 100% of the bowel circumference. The tumor was about 1 cm thick invading through the entire wall and into the underlying mesenteric fat. The tumor also abuts and focally involve the serosa. No extramural satellite lymph nodes noted. Did not seem to involve the peri-intestinal lymph nodes. Tumor was noted to have a germinal center  phenotype.  Patient is healing well from surgery. Continues to be on Imuran 100 mg by mouth daily. Has not reported any other peripheral enlarged lymph nodes. He is starting to eat better.   INTERVAL HISTORY   Luke Mcbride is here for follow-up for his Diffuse large B cell lymphoma of the small intestine. The patient's last visit with [place] was on 04/12/18. He is accompanied today by his wife. The pt reports that he is doing well overall.   The pt reports that he has not developed any new concerns. He denies new lung or kidney issues. The pt notes that he has continued eating well and has enjoyed stable energy levels. He denies having any infections since we last met.  The pt continues on aspirin, plavix, and Imuran.  Lab results today (07/14/18) of CBC w/diff and CMP is as follows: all values are WNL except for RBC at 3.47, HGB at 12.2, HCT at 38.4, MCV at 110.7, MCH at 35.2, RDW at 17.3, Lymphs abs at 300, Glucose at 110, BUN at 27, Creatinine at 1.97, Calcium at 8.8, GFR at 31. 07/14/18 LDH at 187  On review of systems, pt reports eating well, stable energy levels, stable weight, and denies fevers, chills, night sweats, noticing any new lumps or bumps, recent infections, and any other symptoms.   MEDICAL HISTORY:  Past Medical History:  Diagnosis Date  . ALLERGIC RHINITIS   . Anemia    hx  . Barrett's esophagus   . BOOP (bronchiolitis obliterans with organizing pneumonia) ([place])    a. s/p R VATS 2008.  [name] CAD (coronary artery disease)    a. 05/2000: NSTEMI/CABG x 6: LIMA->LAD, VG->D1,  VG->OM1->2, VG->PDA->RPL;  b. 07/2007 MV: high lat infarct, no ischemia, EF 47%;  c. Cath/PCI: LM nl, LAD20p, 12m D1 nl, D2 60-70ost, D3 nl, LCX 70ost, 1067mOM1/OM2 min irregs, RCA 30 diff, PDA 99, LIMA->LAD atretic, VG->D1 100, VG->OM1->2 100, VG->PDA->RPL 90p (4.0x23 Vision BMS);  c. 07/2012 Echo: EF 55%, gr1 DD.  . Marland Kitchenataract   . CKD (chronic kidney disease), stage III ([facility]   a. renal bx 2008: GLN  with vasculitis  . Diffuse large B cell lymphoma ([facility]3/24/2018  . GERD (gastroesophageal reflux disease)   . Glaucoma    a. Cannot see out of L eye.  . Hematuria    Microscopic  . Hyperglycemia    Patient reported while on prednisone, had to take insulin  . Hyperlipemia   . Hypertension   . Hypothyroidism   . ILD (interstitial lung disease) ([facility] [place]  . Iritis   . Local infection of skin and subcutaneous tissue   . Membranoproliferative nephritis   . Myocardial infarction ([facility]  . Neutropenia, drug-induced ([facility]  . Recurrent boils   . Residual foreign body in soft tissue   . Vasculitis ([place]   a.  pauciimmune vasculitis with renal involvement and hx of transient hemoptysis in the past with associated BOOP, 2008 (renal bx 2008: GLN with vasculitis). b. History of treatment with 2 cycles of Cytoxan and pheresis. H/o hemoptysis and pulm hemorrhage with 2nd cycle of cytoxan.    SURGICAL HISTORY: Past Surgical History:  Procedure Laterality Date  . BOWEL RESECTION N/A 07/12/2016   Procedure: SMALL BOWEL RESECTION;  Surgeon: MaRolm BookbinderMD;  Location: [facility] Service: General;  Laterality: N/A;  . CATARACT EXTRACTION Bilateral   . CORONARY ARTERY BYPASS GRAFT  2002  . CORONARY STENT INTERVENTION N/A 03/31/2017   Procedure: CORONARY STENT INTERVENTION;  Surgeon: JoMartiniquePeter M, MD;  [facility];  Service: Cardiovascular;  Laterality: N/A;  . IR FLUORO GUIDE PORT INSERTION RIGHT  08/20/2016  . IR REMOVAL TUN ACCESS W/ PORT W/O FL MOD SED  01/20/2017  . IR [place] VASC ACCESS RIGHT  08/20/2016  . LAPAROSCOPY N/A 07/12/2016   Procedure: LAPAROSCOPY DIAGNOSTIC;  Surgeon: MaRolm BookbinderMD;  Location: [facility] Service: General;  Laterality: N/A;  . LEFT HEART CATHETERIZATION WITH CORONARY/GRAFT ANGIOGRAM N/A 07/26/2012   Procedure: LEFT HEART CATHETERIZATION WITH COBeatrix Fetters Surgeon: MuWellington HampshireMD;  Location: [facility];  Service:  Cardiovascular;  Laterality: N/A;  . LUNG BIOPSY    . PERCUTANEOUS CORONARY STENT INTERVENTION (PCI-S)  07/26/2012   Procedure: PERCUTANEOUS CORONARY STENT INTERVENTION (PCI-S);  Surgeon: MuWellington HampshireMD;  [facility];  Service: Cardiovascular;;  . RENAL BIOPSY    . RIGHT/LEFT HEART CATH AND CORONARY/GRAFT ANGIOGRAPHY N/A 03/23/2017   Procedure: RIGHT/LEFT HEART CATH AND CORONARY/GRAFT ANGIOGRAPHY;  Surgeon: JoMartiniquePeter M, MD;  [facility];  Service: Cardiovascular;  Laterality: N/A;  . TOE AMPUTATION  2009   hammer toe    SOCIAL HISTORY: Social History   Socioeconomic History  . Marital status: Married    Spouse name: Not on file  . Number of children: Not on file  . Years of education: Not on file  . Highest education level: Not on file  Occupational History  . Occupation: [employer]. Financial resource strain: Not on file  . Food insecurity:    Worry: Not on file  Inability: Not on file  . Transportation needs:    Medical: Not on file    Non-medical: Not on file  Tobacco Use  . Smoking status: Never Smoker  . Smokeless tobacco: Never Used  Substance and Sexual Activity  . Alcohol use: No  . Drug use: No  . Sexual activity: Not on file  Lifestyle  . Physical activity:    Days per week: Not on file    Minutes per session: Not on file  . Stress: Not on file  Relationships  . Social connections:    Talks on phone: Not on file    Gets together: Not on file    Attends religious service: Not on file    Active member of club or organization: Not on file    Attends meetings of clubs or organizations: Not on file    Relationship status: Not on file  . Intimate partner violence:    Fear of current or ex partner: Not on file    Emotionally abused: Not on file    Physically abused: Not on file    Forced sexual activity: Not on file  Other Topics Concern  . Not on file  Social History Narrative   Regular exercise - yes       [facility] (07/12/16):   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  no pets currently. No bird or mold exposure.    FAMILY HISTORY: Family History  Problem Relation Age of Onset  . Heart disease Mother   . Diabetes Mother   . Prostate cancer Father   . Depression Other   . Diabetes Other   . Prostate cancer Other   . Colon polyps Neg Hx   . Colon cancer Neg Hx   . Rectal cancer Neg Hx   . Stomach cancer Neg Hx   . Esophageal cancer Neg Hx     ALLERGIES:  has No Known Allergies.  MEDICATIONS:  Current Outpatient Medications  Medication Sig Dispense Refill  . acetaminophen (TYLENOL) 500 MG tablet Take 500 mg by mouth daily as needed for moderate pain or headache.     [name] aspirin EC 81 MG EC tablet Take 1 tablet (81 mg total) by mouth daily. 30 tablet   . azaTHIOprine (IMURAN) 50 MG tablet Take 2 tablets (100 mg total) by mouth daily. 90 tablet 1  . clopidogrel (PLAVIX) 75 MG tablet TAKE 1 TABLET BY MOUTH  DAILY 90 tablet 3  . dorzolamide-timolol (COSOPT) 22.3-6.8 MG/ML ophthalmic solution Place 1 drop into both eyes 2 (two) times daily.     . finasteride (PROSCAR) 5 MG tablet Take 5 mg by mouth at bedtime.     . furosemide (LASIX) 20 MG tablet TAKE 1 TABLET BY MOUTH  DAILY 90 tablet 3  . levothyroxine (SYNTHROID, LEVOTHROID) 100 MCG tablet Take 100 mcg by mouth daily before breakfast.     . metoprolol succinate (TOPROL-XL) 25 MG 24 hr tablet TAKE 1 TABLET BY MOUTH  DAILY 90 tablet 3  . Multiple Vitamin (MULTIVITAMIN) tablet Take 1 tablet by mouth daily.      . nitroGLYCERIN (NITROSTAT) 0.4 MG SL tablet DISSOLVE 1 TABLET UNDER THE TONGUE EVERY 5 MINUTES AS  NEEDED FOR CHEST PAIN.  DO  NOT EXCEED A TOTAL OF 3  DOSES IN 15 MINUTES. 100 tablet 0  . pantoprazole (PROTONIX) 40 MG tablet TAKE 1 TABLET BY MOUTH  DAILY 90 tablet 2  . rosuvastatin (CRESTOR) 10 MG tablet Take 10  mg every morning by mouth.    . Tamsulosin HCl (FLOMAX) 0.4 MG CAPS Take 0.4 mg by  mouth at bedtime.      No current facility-administered medications for this visit.    Facility-Administered Medications Ordered in Other Visits  Medication Dose Route Frequency Provider Last Rate Last Dose  . sodium chloride flush (NS) 0.9 % injection 10 mL  10 mL Intracatheter PRN Brunetta Genera, MD   10 mL at 08/24/16 1653  . sodium chloride flush (NS) 0.9 % injection 10 mL  10 mL Intracatheter PRN Brunetta Genera, MD   10 mL at 10/07/16 1555    REVIEW OF SYSTEMS:   A 10+ POINT REVIEW OF SYSTEMS WAS OBTAINED including neurology, dermatology, psychiatry, cardiac, respiratory, lymph, extremities, GI, GU, Musculoskeletal, constitutional, breasts, reproductive, HEENT.  All pertinent positives are noted in the HPI.  All others are negative.   PHYSICAL EXAMINATION: ECOG PERFORMANCE STATUS: 2 - Symptomatic, <50% confined to bed  Vitals:   07/14/18 1033  BP: (!) 141/100  Pulse: 84  Resp: 18  Temp: 98.4 F (36.9 C)  SpO2: 97%   Filed Weights   07/14/18 1033  Weight: 228 lb 12.8 oz (103.8 kg)   .Body mass index is 31.03 kg/m.  GENERAL:alert, in no acute distress and comfortable SKIN: no acute rashes, no significant lesions EYES: conjunctiva are pink and non-injected, sclera anicteric OROPHARYNX: MMM, no exudates, no oropharyngeal erythema or ulceration NECK: supple, no JVD LYMPH:  no palpable lymphadenopathy in the cervical, axillary or inguinal regions LUNGS: clear to auscultation b/l with normal respiratory effort HEART: regular rate & rhythm ABDOMEN:  normoactive bowel sounds , non tender, not distended. No palpable hepatosplenomegaly.  Extremity: 1+ pitting pedal edema PSYCH: alert & oriented x 3 with fluent speech NEURO: no focal motor/sensory deficits    LABORATORY DATA:  I have reviewed the data as listed . CBC Latest Ref Rng & Units 07/14/2018 04/12/2018 01/30/2018  WBC 4.0 - 10.5 K/uL 4.8 5.0 7.7  Hemoglobin 13.0 - 17.0 g/dL 12.2(L) 11.8(L) 11.2(L)    Hematocrit 39.0 - 52.0 % 38.4(L) 37.5(L) 36.6(L)  Platelets 150 - 400 K/uL 166 191 207   . CMP Latest Ref Rng & Units 07/14/2018 04/12/2018 01/30/2018  Glucose 70 - 99 mg/dL 110(H) 111(H) 117(H)  BUN 8 - 23 mg/dL 27(H) 31(H) 36(H)  Creatinine 0.61 - 1.24 mg/dL 1.97(H) 2.10(H) 2.38(H)  Sodium 135 - 145 mmol/L 141 143 141  Potassium 3.5 - 5.1 mmol/L 4.8 4.3 4.3  Chloride 98 - 111 mmol/L 108 106 106  CO2 22 - 32 mmol/L '26 26 23  '$ Calcium 8.9 - 10.3 mg/dL 8.8(L) 9.0 9.3  Total Protein 6.5 - 8.1 g/dL 7.1 7.0 -  Total Bilirubin 0.3 - 1.2 mg/dL 0.6 0.6 -  Alkaline Phos 38 - 126 U/L 61 59 -  AST 15 - 41 U/L 15 16 -  ALT 0 - 44 U/L 9 12 -    Lab Results  Component Value Date   LDH 187 07/14/2018     RADIOGRAPHIC STUDIES: I have personally reviewed the radiological images as listed and agreed with the findings in the report.  Nm Pet Image Restag (ps) Skull Base To Thigh  Result Date: 10/22/2016 CLINICAL DATA:  Subsequent treatment strategy for diffuse large B-cell non-Hodgkin's lymphoma. EXAM: NUCLEAR MEDICINE PET SKULL BASE TO THIGH TECHNIQUE: 11.15 mCi F-18 FDG was injected intravenously. Full-ring PET imaging was performed from the skull base to thigh after the radiotracer. CT data was  obtained and used for attenuation correction and anatomic localization. FASTING BLOOD GLUCOSE:  Value: 102 mg/dl COMPARISON:  None. FINDINGS: NECK No hypermetabolic lymph nodes in the neck. CHEST The FDG avid right axillary lymph nodes and epicardial lymph nodes have resolved in the interval. The focal hypermetabolic activity in the left ventricular myocardium described on the previous study is unchanged. No new abnormalities in the chest. ABDOMEN/PELVIS Significant improvement in the abdomen. No definitive FDG avid disease remains in the liver or spleen. The adenopathy in the abdomen has resolved. Minimal uptake in the normal appearing left adrenal gland is of no significance. No abnormal uptake seen in the soft  tissues of the pelvis. The omental nodularity seen previously has resolved. There is increased attenuation in the fat associated with the anterior ventral hernia with low level uptake, likely inflammatory. The maximum SUV is measured on image 161 is 3.7. SKELETON There has been significant overall improvement in the bones as well. Uptake in the anterior left acetabulum has significantly diminished with a maximum SUV of 3.4 today versus 29.4 previously. The left iliac uptake seen previously is no longer significantly FDG avid. There is mild focal uptake in the posterior left iliac bone on series 4, image 166 which was not seen previously and demonstrates a maximum SUV of 4.26 today. No other bony abnormalities are identified. IMPRESSION: 1. Significant interval improvement. No FDG avid disease remains in the soft tissues of the neck, chest, abdomen, or pelvis. Very mild uptake remains in the known left anterior acetabular lesion. Mild new uptake in the posterior left iliac bone is nonspecific but could represent mild involvement. Recommend attention on follow-up. 2. Increased attenuation and mild uptake in the fat of a lower midline ventral hernia is likely inflammatory. Recommend attention on follow-up. 3. No other interval change. Electronically Signed   By: Dorise Bullion III M.D   On: 10/22/2016 15:33   PET/CT 08/06/2016: IMPRESSION: 1. In addition to hypermetabolic nodal involvement of the right axilla, pericardial space, porta hepatis/mesenteric root, and retroperitoneum, there is extensive involvement of the liver as well as peritoneal involvement along the liver surface, splenic surface, and upper omentum. 2. Hypermetabolic bony lesions in the left pelvis compatible with malignant involvement. 3. There is a focus of hypermetabolic activity along the anterolateral wall of the left ventricle. Normally such findings turn out to simply be hypermetabolic myocardium. Strictly speaking given how focal  this appears I cannot completely exclude the possibility of tumor involvement within or along the myocardium.   Electronically Signed   By: Van Clines M.D.   On: 08/06/2016 12:48   PET/CT 12/29/2016  IMPRESSION: 1. No evidence of residual or recurrent hypermetabolic lymphoma. 2.  Aortic Atherosclerosis (ICD10-I70.0). 3. Dilatation of the infrarenal aorta and pelvic vasculature, as detailed above.   Electronically Signed   By: Abigail Miyamoto M.D.   On: 12/29/2016 15:17   ASSESSMENT & PLAN:   83 y.o.  male with multiple medical co-morbidities including cardiac co-morbidities, IPF, vascular on chronic immunosupression with    1) Stage IV Diffuse large B-cell lymphoma involving the small intestine (germinal cell phenotype) with complete thickness involvement of bowel and abutting serosa.- currently in remission.  Initial PET/CT scan showed extensive involvement with DLBCL, hypermetabolic nodal involvement of the right axilla, pericardial space, porta hepatis/mesenteric root, and retroperitoneum, there is extensive involvement of the liver as well as peritoneal involvement along the liver surface, splenic surface, and upper omentum. 2. Hypermetabolic bony lesions in the left pelvis compatible with malignant  involvement. 3. There is a focus of hypermetabolic activity along the anterolateral wall of the left ventricle.   s/p 6 cycles of R-CEOP .   PET/CT scan on 10/22/2016 after 3 cycles of chemotherapy shows excellent response to treatment. PET/CT scan on 12/29/2016 after 6 cycles of treatment shows No evidence of residual or recurrent hypermetabolic lymphoma  A)Sh/oneutropenic fever and E.coli/pseudomonal bacteremia/HCAP after the first cycle of treatment.  Significant chemotherapy related thrombocytopenia - now resolved. B) h/o hospitalization for perforation diverticulitis - no issues currently  2) Chronic kidney disease stage III baseline creatinine about 2 to 2.5. -  stable today 3) Coronary disease status post PCI and CABG - had a myocardial perfusion imaging scan on 06/30/2016 that showed a moderately decreased ejection fraction of 30-44%- following with cardiology for management. Was having some SOB/DOE and had cardiac cath with PCI to mid LAD on11/15/2018. 4) History of pulmonary and renal vasculitis back on chronic Imuran.  5) Neutropenia -resolved  6) Thrombocytopenia due to chemotherapy -resolved  7) Anemia - much improved hgb is up to 12.2  PLAN:  -Discussed pt labwork today, 07/14/18; HGB has continued to improve now to 12.2, PLT normal at 166k, WBC normal at 4.8k. Creatinine improved to 1.97. Other chemistries are stable. LDH normal at 187. -The pt is about 18 months out from treatment and shows no clinical or lab progression/return of his DLBCL at this time.  -No indication for further treatment at this time.   -Recommend using a humidifier at night  -Recommend continuing with annual flu vaccines, pt last received Prevnar in 2016, and Pneumovax in 2004. He denies receiving Pneumovax in the last 5 years. -Pneumovax in 4 month -Will give Prevnar in clinic today, 07/14/18, which the pt prefers -Continue follow up with nephrologist Dr. Servando Salina regarding vasculitis and Imuran  -Recommended continued f/u with dermatology for screening/management of his recurrent cutaneous SCC -Will see the pt back in 4 months  8) Arthritis, especially in right knee -Week of 09/18/17 he had fluid drawn form right knee and given cortisone shot by PCP -Encouraged him to be active with walking or PT            Prevnar vaccine today RTC with Dr Irene Limbo with labs in 4 months Pneumovax in 4 months on return    All of the patients questions were answered with apparent satisfaction. The patient knows to call the clinic with any problems, questions or concerns.   The total time spent in the appt was 25 minutes and more than 50% was on counseling and direct patient  cares.    Sullivan Lone MD [place] AAHIVMS [facility] Hematology/Oncology Physician [facility]  (Office):       [phone] (Work cell):  [phone] (Fax):           [phone]  I, Baldwin [place], am acting as a scribe for Dr. Sullivan Lone.   .I have reviewed the above documentation for accuracy and completeness, and I agree with the above. Brunetta Genera MD

## 2018-07-14 ENCOUNTER — Inpatient Hospital Stay (HOSPITAL_BASED_OUTPATIENT_CLINIC_OR_DEPARTMENT_OTHER): Payer: Medicare Other | Admitting: Hematology

## 2018-07-14 ENCOUNTER — Inpatient Hospital Stay: Payer: Medicare Other | Attending: Hematology

## 2018-07-14 ENCOUNTER — Telehealth: Payer: Self-pay | Admitting: Hematology

## 2018-07-14 VITALS — BP 141/100 | HR 84 | Temp 98.4°F | Resp 18 | Ht 72.0 in | Wt 228.8 lb

## 2018-07-14 DIAGNOSIS — D649 Anemia, unspecified: Secondary | ICD-10-CM | POA: Insufficient documentation

## 2018-07-14 DIAGNOSIS — Z23 Encounter for immunization: Secondary | ICD-10-CM | POA: Diagnosis not present

## 2018-07-14 DIAGNOSIS — Z951 Presence of aortocoronary bypass graft: Secondary | ICD-10-CM | POA: Insufficient documentation

## 2018-07-14 DIAGNOSIS — Z7982 Long term (current) use of aspirin: Secondary | ICD-10-CM

## 2018-07-14 DIAGNOSIS — Z79899 Other long term (current) drug therapy: Secondary | ICD-10-CM | POA: Insufficient documentation

## 2018-07-14 DIAGNOSIS — I129 Hypertensive chronic kidney disease with stage 1 through stage 4 chronic kidney disease, or unspecified chronic kidney disease: Secondary | ICD-10-CM | POA: Insufficient documentation

## 2018-07-14 DIAGNOSIS — C8330 Diffuse large B-cell lymphoma, unspecified site: Secondary | ICD-10-CM

## 2018-07-14 DIAGNOSIS — C8338 Diffuse large B-cell lymphoma, lymph nodes of multiple sites: Secondary | ICD-10-CM

## 2018-07-14 DIAGNOSIS — Z8249 Family history of ischemic heart disease and other diseases of the circulatory system: Secondary | ICD-10-CM

## 2018-07-14 DIAGNOSIS — N183 Chronic kidney disease, stage 3 (moderate): Secondary | ICD-10-CM | POA: Insufficient documentation

## 2018-07-14 LAB — CBC WITH DIFFERENTIAL/PLATELET
Abs Immature Granulocytes: 0.02 10*3/uL (ref 0.00–0.07)
Basophils Absolute: 0 10*3/uL (ref 0.0–0.1)
Basophils Relative: 0 %
Eosinophils Absolute: 0.1 10*3/uL (ref 0.0–0.5)
Eosinophils Relative: 2 %
HCT: 38.4 % — ABNORMAL LOW (ref 39.0–52.0)
Hemoglobin: 12.2 g/dL — ABNORMAL LOW (ref 13.0–17.0)
Immature Granulocytes: 0 %
LYMPHS ABS: 0.3 10*3/uL — AB (ref 0.7–4.0)
Lymphocytes Relative: 5 %
MCH: 35.2 pg — ABNORMAL HIGH (ref 26.0–34.0)
MCHC: 31.8 g/dL (ref 30.0–36.0)
MCV: 110.7 fL — ABNORMAL HIGH (ref 80.0–100.0)
MONO ABS: 0.5 10*3/uL (ref 0.1–1.0)
Monocytes Relative: 11 %
NRBC: 0 % (ref 0.0–0.2)
Neutro Abs: 3.9 10*3/uL (ref 1.7–7.7)
Neutrophils Relative %: 82 %
Platelets: 166 10*3/uL (ref 150–400)
RBC: 3.47 MIL/uL — ABNORMAL LOW (ref 4.22–5.81)
RDW: 17.3 % — ABNORMAL HIGH (ref 11.5–15.5)
WBC: 4.8 10*3/uL (ref 4.0–10.5)

## 2018-07-14 LAB — CMP (CANCER CENTER ONLY)
ALT: 9 U/L (ref 0–44)
AST: 15 U/L (ref 15–41)
Albumin: 3.6 g/dL (ref 3.5–5.0)
Alkaline Phosphatase: 61 U/L (ref 38–126)
Anion gap: 7 (ref 5–15)
BUN: 27 mg/dL — ABNORMAL HIGH (ref 8–23)
CO2: 26 mmol/L (ref 22–32)
CREATININE: 1.97 mg/dL — AB (ref 0.61–1.24)
Calcium: 8.8 mg/dL — ABNORMAL LOW (ref 8.9–10.3)
Chloride: 108 mmol/L (ref 98–111)
GFR, EST AFRICAN AMERICAN: 36 mL/min — AB (ref >60)
GFR, Estimated: 31 mL/min — ABNORMAL LOW (ref >60)
Glucose, Bld: 110 mg/dL — ABNORMAL HIGH (ref 70–99)
Potassium: 4.8 mmol/L (ref 3.5–5.1)
Sodium: 141 mmol/L (ref 135–145)
Total Bilirubin: 0.6 mg/dL (ref 0.3–1.2)
Total Protein: 7.1 g/dL (ref 6.5–8.1)

## 2018-07-14 LAB — LACTATE DEHYDROGENASE: LDH: 187 U/L (ref 98–192)

## 2018-07-14 MED ORDER — PNEUMOCOCCAL 13-VAL CONJ VACC IM SUSP
0.5000 mL | Freq: Once | INTRAMUSCULAR | Status: DC
  Filled 2018-07-14: qty 0.5

## 2018-07-14 NOTE — Telephone Encounter (Signed)
Scheduled appt per 02/28 los. ° °Printed calendar and avs. °

## 2018-08-09 IMAGING — PT NM PET TUM IMG RESTAG (PS) SKULL BASE T - THIGH
8 series · 25 of 25 positions shown · non-contrast
Comparison: None.

CLINICAL DATA: Subsequent treatment strategy for diffuse large
B-cell non-Hodgkin's lymphoma.

EXAM:
NUCLEAR MEDICINE PET SKULL BASE TO THIGH
TECHNIQUE: 11.15 mCi F-18 FDG was injected intravenously. Full-ring PET imaging
was performed from the skull base to thigh after the radiotracer. CT
data was obtained and used for attenuation correction and anatomic
localization.
FASTING BLOOD GLUCOSE:  Value: 102 mg/dl

[Series 3: pet sk_thigh ac · axial · 5.0mm · 4.07mm/px · z∈[-1006,-98]mm · 5 of 228 slices shown]
[im 1/228]
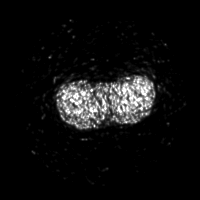
[im 57/228]
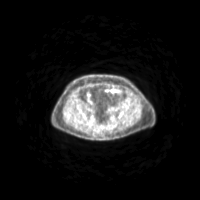
[im 114/228]
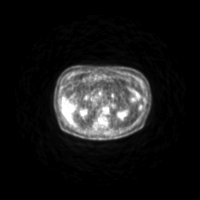
[im 171/228]
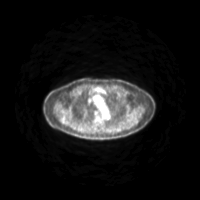
[im 228/228]
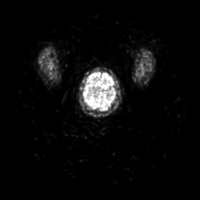

[Series 4: ct sk_thigh 5.0 b31f · axial · 5.0mm · 0.98mm/px · z∈[-1006,-98]mm · 5 of 228 slices shown]
[im 1/228]
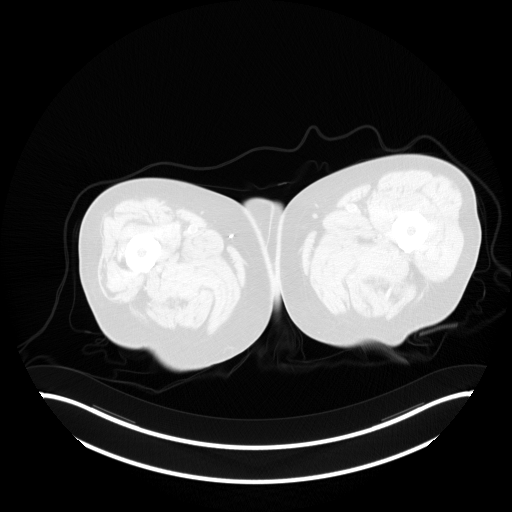
[im 57/228]
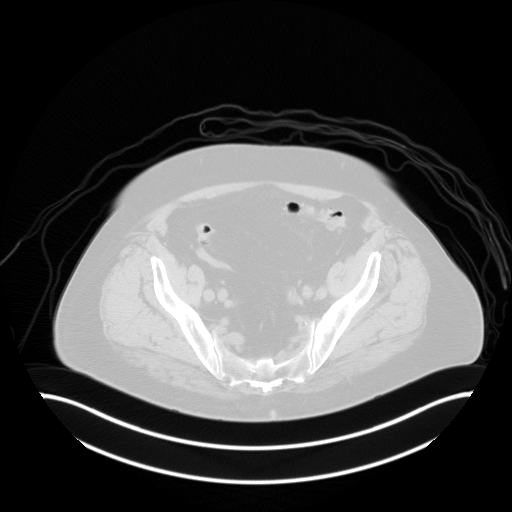
[im 114/228]
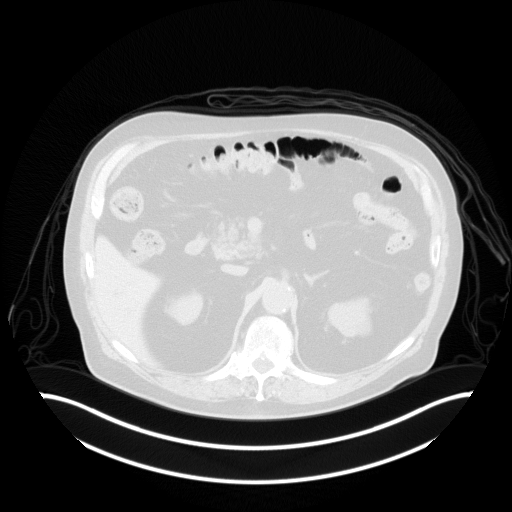
[im 171/228]
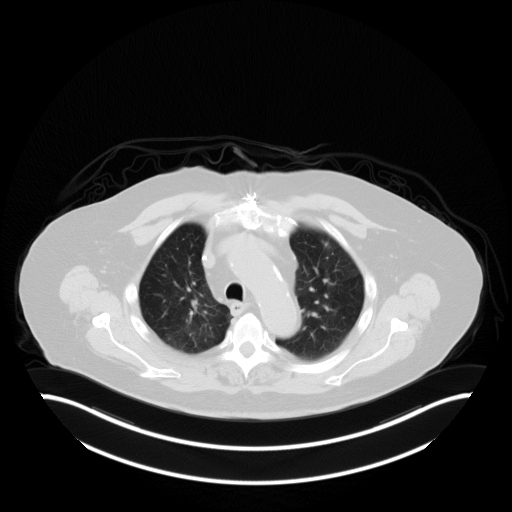
[im 228/228  brain]
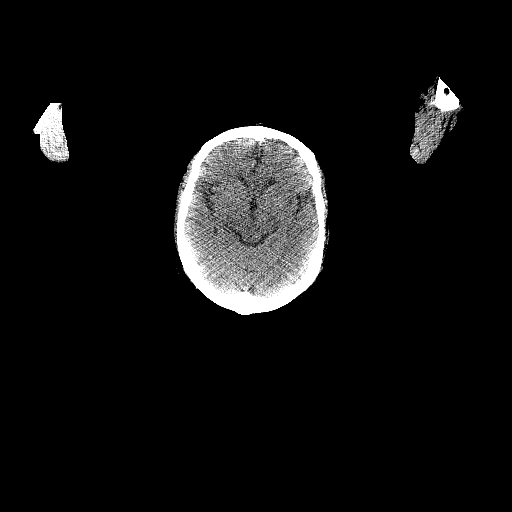

[Series 7: pet sk_thigh nac · axial · 5.0mm · 4.07mm/px · z∈[-1006,-98]mm · 5 of 228 slices shown]
[im 1/228]
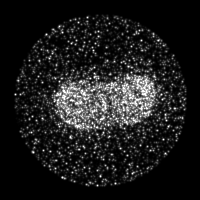
[im 57/228]
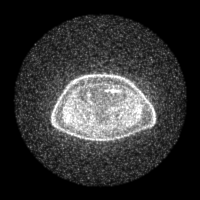
[im 114/228]
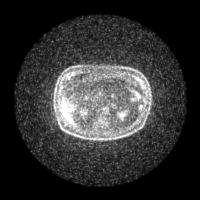
[im 171/228]
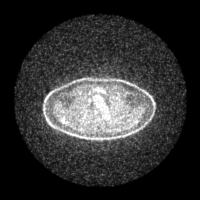
[im 228/228]
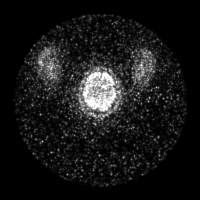

[Series 8: ct sk_thigh $2ct sk_thigh 3$lung_bone · axial · 5.0mm · 0.73mm/px · 1 of 65 slices shown]
[im 1/65  bone]
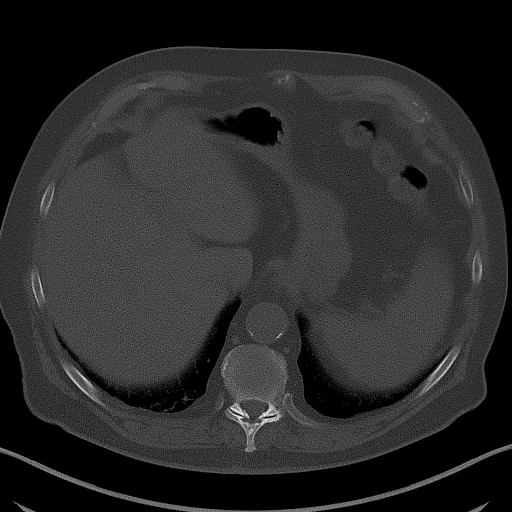

[Series 604: mip collection · coronal · 1.88mm/px · 1 of 32 slices shown]
[im 1/32]
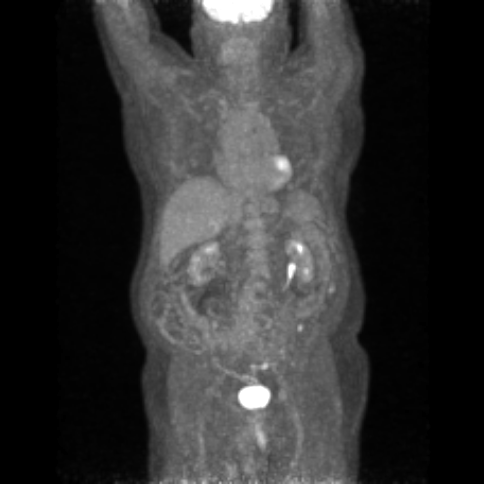

[Series 605: range-ct sk_thigh 5.0 (id)<alpha range> · 2 of 82 slices shown (1 of 2)]
[im 1/82]
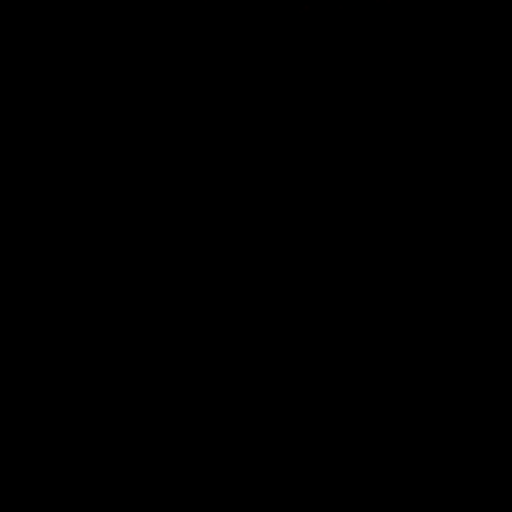
[im 82/82]
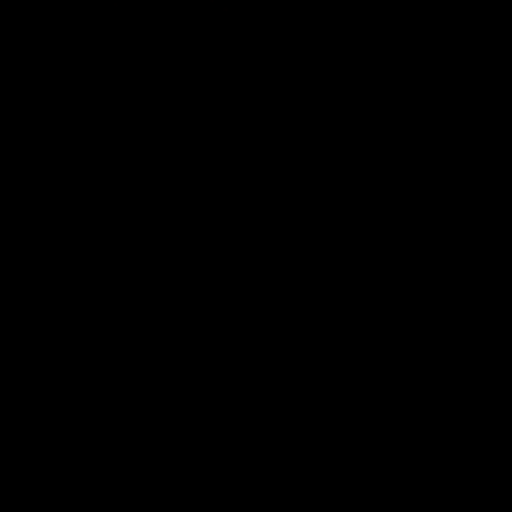

[Series 606: range-ct sk_thigh 5.0 (id)<alpha range> · 5 of 220 slices shown (2 of 2)]
[im 1/220]
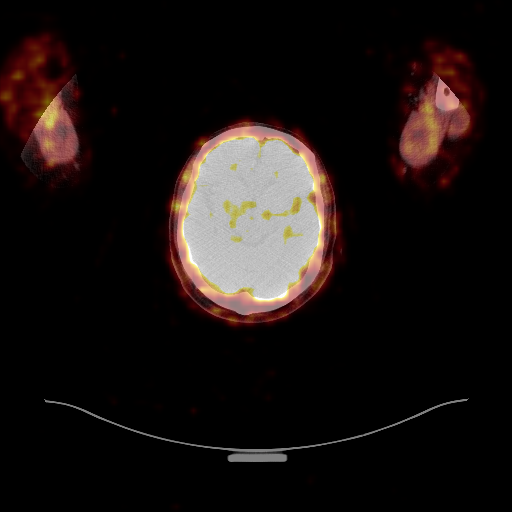
[im 55/220]
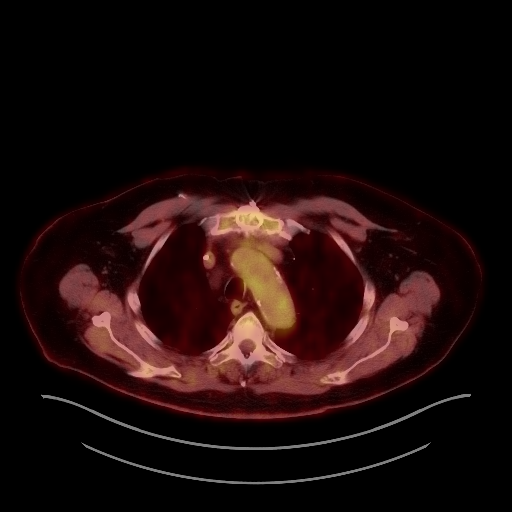
[im 110/220]
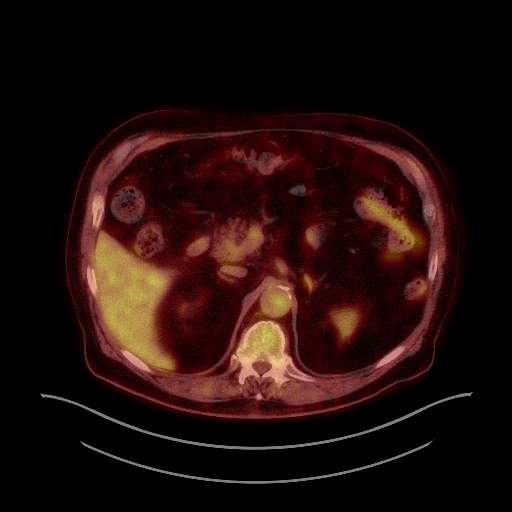
[im 165/220]
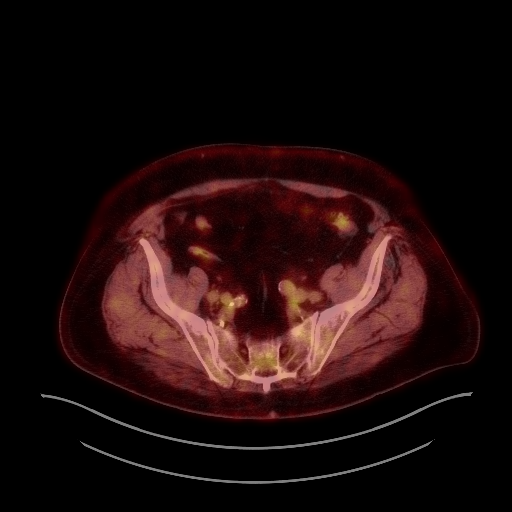
[im 220/220]
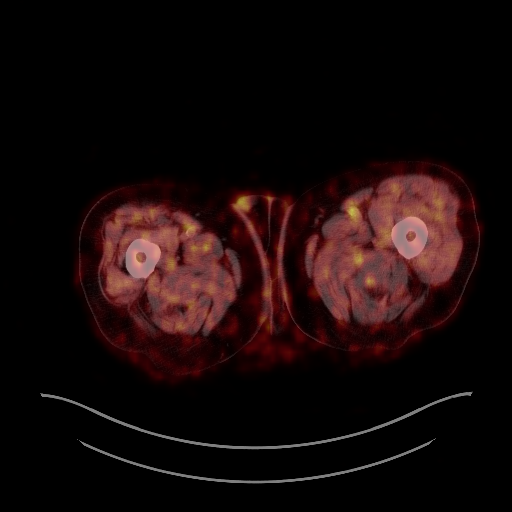

[Series 1065: results mm oncology reading · 1.24mm/px · 1 of 1 slices shown]
[im 1/1]
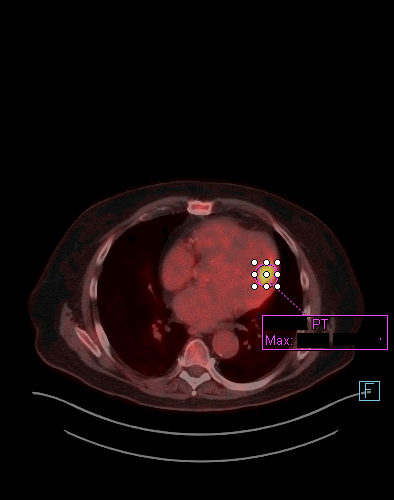

[25 of 25 positions shown; findings below may reference images not displayed]

FINDINGS: NECK

No hypermetabolic lymph nodes in the neck.

CHEST

The FDG avid right axillary lymph nodes and epicardial lymph nodes
have resolved in the interval. The focal hypermetabolic activity in
the left ventricular myocardium described on the previous study is
unchanged. No new abnormalities in the chest.

ABDOMEN/PELVIS

Significant improvement in the abdomen. No definitive FDG avid
disease remains in the liver or spleen. The adenopathy in the
abdomen has resolved. Minimal uptake in the normal appearing left
adrenal gland is of no significance. No abnormal uptake seen in the
soft tissues of the pelvis. The omental nodularity seen previously
has resolved.

There is increased attenuation in the fat associated with the
anterior ventral hernia with low level uptake, likely inflammatory.
The maximum SUV is measured on image 161 is 3.7.

SKELETON

There has been significant overall improvement in the bones as well.
Uptake in the anterior left acetabulum has significantly diminished
with a maximum SUV of 3.4 today versus 29.4 previously. The left
iliac uptake seen previously is no longer significantly FDG avid.
There is mild focal uptake in the posterior left iliac bone on
series 4, image 166 which was not seen previously and demonstrates a
maximum SUV of 4.26 today. No other bony abnormalities are
identified.
IMPRESSION: 1. Significant interval improvement. No FDG avid disease remains in
the soft tissues of the neck, chest, abdomen, or pelvis. Very mild
uptake remains in the known left anterior acetabular lesion. Mild
new uptake in the posterior left iliac bone is nonspecific but could
represent mild involvement. Recommend attention on follow-up.
2. Increased attenuation and mild uptake in the fat of a lower
midline ventral hernia is likely inflammatory. Recommend attention
on follow-up.
3. No other interval change.

## 2018-10-10 DIAGNOSIS — C44319 Basal cell carcinoma of skin of other parts of face: Secondary | ICD-10-CM | POA: Diagnosis not present

## 2018-10-10 DIAGNOSIS — D0472 Carcinoma in situ of skin of left lower limb, including hip: Secondary | ICD-10-CM | POA: Diagnosis not present

## 2018-10-24 ENCOUNTER — Telehealth: Payer: Self-pay

## 2018-10-24 NOTE — Telephone Encounter (Signed)
Patient did not want a virtual visit. Moved patient's appointment to 01/17/19.

## 2018-10-26 DIAGNOSIS — H401133 Primary open-angle glaucoma, bilateral, severe stage: Secondary | ICD-10-CM | POA: Diagnosis not present

## 2018-10-26 DIAGNOSIS — H401211 Low-tension glaucoma, right eye, mild stage: Secondary | ICD-10-CM | POA: Diagnosis not present

## 2018-10-26 DIAGNOSIS — H401131 Primary open-angle glaucoma, bilateral, mild stage: Secondary | ICD-10-CM | POA: Diagnosis not present

## 2018-10-26 DIAGNOSIS — H40023 Open angle with borderline findings, high risk, bilateral: Secondary | ICD-10-CM | POA: Diagnosis not present

## 2018-10-26 DIAGNOSIS — H44512 Absolute glaucoma, left eye: Secondary | ICD-10-CM | POA: Diagnosis not present

## 2018-10-30 ENCOUNTER — Ambulatory Visit: Payer: Medicare Other | Admitting: Cardiovascular Disease

## 2018-11-02 DIAGNOSIS — N183 Chronic kidney disease, stage 3 (moderate): Secondary | ICD-10-CM | POA: Diagnosis not present

## 2018-11-02 DIAGNOSIS — K2270 Barrett's esophagus without dysplasia: Secondary | ICD-10-CM | POA: Diagnosis not present

## 2018-11-02 DIAGNOSIS — D631 Anemia in chronic kidney disease: Secondary | ICD-10-CM | POA: Diagnosis not present

## 2018-11-02 DIAGNOSIS — C8330 Diffuse large B-cell lymphoma, unspecified site: Secondary | ICD-10-CM | POA: Diagnosis not present

## 2018-11-02 DIAGNOSIS — N2581 Secondary hyperparathyroidism of renal origin: Secondary | ICD-10-CM | POA: Diagnosis not present

## 2018-11-02 DIAGNOSIS — N039 Chronic nephritic syndrome with unspecified morphologic changes: Secondary | ICD-10-CM | POA: Diagnosis not present

## 2018-11-02 DIAGNOSIS — I129 Hypertensive chronic kidney disease with stage 1 through stage 4 chronic kidney disease, or unspecified chronic kidney disease: Secondary | ICD-10-CM | POA: Diagnosis not present

## 2018-11-02 DIAGNOSIS — N189 Chronic kidney disease, unspecified: Secondary | ICD-10-CM | POA: Diagnosis not present

## 2018-11-02 DIAGNOSIS — E1122 Type 2 diabetes mellitus with diabetic chronic kidney disease: Secondary | ICD-10-CM | POA: Diagnosis not present

## 2018-11-02 DIAGNOSIS — R809 Proteinuria, unspecified: Secondary | ICD-10-CM | POA: Diagnosis not present

## 2018-11-02 DIAGNOSIS — N400 Enlarged prostate without lower urinary tract symptoms: Secondary | ICD-10-CM | POA: Diagnosis not present

## 2018-11-02 DIAGNOSIS — I2510 Atherosclerotic heart disease of native coronary artery without angina pectoris: Secondary | ICD-10-CM | POA: Diagnosis not present

## 2018-11-13 NOTE — Progress Notes (Signed)
HEMATOLOGY/ONCOLOGY CLINIC NOTE  Date of Service: 11/14/18  Patient Care Team: Donato Heinz, MD as PCP - General (Nephrology) [name], MD as PCP - Cardiology (Cardiology) Brunetta Genera, MD as Consulting Physician (Hematology)  CHIEF COMPLAINTS/PURPOSE OF CONSULTATION:  F/u for Diffuse large B cell lymphoma of the small intestine  HISTORY OF PRESENTING ILLNESS:   Luke Mcbride is a wonderful 83 y.o. male who has been referred to [place] by Dr .Donato Heinz, MD and Mauricio Po MD for evaluation and management of newly diagnosed diffuse large B-cell lymphoma of the small bowel.  Patient has a history of multiple medical comorbidities as noted below including coronary artery disease status post PCI and CABG, chronic kidney disease stage III, Barrett's esophagus, hypertension, dyslipidemia, interstitial lung disease, GERD, vasculitis involving the lungs and kidneys on chronic Imuran therapy.  He was having upper abdominal discomfort for a few months with loss of about 15-20 pounds. He had a CT scan of the abdomen done by Dr. Havery Moros on 06/15/2016 which showed a 3.3 x 3.2 x 2.4 cm indeterminate lesion involving the left upper quadrant small bowel loops. No additional small bowel lesions noted. No ascites. No retroperitoneal mesenteric or pelvic lymphadenopathy noted.  Patient was seen by Dr. Serita Grammes and underwent a diagnostic laparoscopy with small bowel resection on 07/12/2016. Pathology showed that the lesion was consistent with diffuse large B-cell lymphoma 2 cm in length and 4 cm in width centrally ulcerated mass involving 100% of the bowel circumference. The tumor was about 1 cm thick invading through the entire wall and into the underlying mesenteric fat. The tumor also abuts and focally involve the serosa. No extramural satellite lymph nodes noted. Did not seem to involve the peri-intestinal lymph nodes. Tumor was noted to have a germinal center  phenotype.  Patient is healing well from surgery. Continues to be on Imuran 100 mg by mouth daily. Has not reported any other peripheral enlarged lymph nodes. He is starting to eat better.   INTERVAL HISTORY   Luke Mcbride is here for follow-up for his Diffuse large B cell lymphoma of the small intestine. The patient's last visit with [place] was on 07/14/18. The pt reports that he is doing well overall.  The pt reports that he has been avoiding social distancing and denies any concerns for infections at this time. He has had some SCC removed with his dermatologist.  The pt notes that he has returned to Imuran, two in the morning and one at night. He continues on Plavix without any concerns for bleeding.   The pt denies noticing any other lumps or bumps and denies fevers, chills, night sweats, or unexpected weight loss.  Lab results today (11/14/18) of CBC w/diff is as follows: all values are WNL except for RBC at 3.59, HGB at 12.7, HCT at 38.7, MCV at 107.8, MCH at 35.4, RDW at 15.9, Lymphs abs at 200. 11/14/18 LDH and CMP are pending  On review of systems, pt reports eating well, staying active, stable weight, stable energy levels, and denies abdominal pains, fevers, chills, night sweats, unexpected weight loss, changes in breathing, noticing any new lumps or bumps, back pain, pain along the spine, changes in breathing, concerns for infections, testicular pain or swelling, and any other symptoms.   MEDICAL HISTORY:  Past Medical History:  Diagnosis Date   ALLERGIC RHINITIS    Anemia    hx   Barrett's esophagus    BOOP (bronchiolitis obliterans with organizing  pneumonia) ([place])    a. s/p R VATS 2008.   CAD (coronary artery disease)    a. 05/2000: NSTEMI/CABG x 6: LIMA->LAD, VG->D1, VG->OM1->2, VG->PDA->RPL;  b. 07/2007 MV: high lat infarct, no ischemia, EF 47%;  c. Cath/PCI: LM nl, LAD20p, 5m, D1 nl, D2 60-70ost, D3 nl, LCX 70ost, 113m, OM1/OM2 min irregs, RCA 30 diff, PDA 99,  LIMA->LAD atretic, VG->D1 100, VG->OM1->2 100, VG->PDA->RPL 90p (4.0x23 Vision BMS);  c. 07/2012 Echo: EF 55%, gr1 DD.   Cataract    CKD (chronic kidney disease), stage III ([place])    a. renal bx 2008: GLN with vasculitis   Diffuse large B cell lymphoma ([place]) 08/07/2016   GERD (gastroesophageal reflux disease)    Glaucoma    a. Cannot see out of L eye.   Hematuria    Microscopic   Hyperglycemia    Patient reported while on prednisone, had to take insulin   Hyperlipemia    Hypertension    Hypothyroidism    ILD (interstitial lung disease) ([place])    Iritis    Local infection of skin and subcutaneous tissue    Membranoproliferative nephritis    Myocardial infarction ([place]) 2002   Neutropenia, drug-induced (H[facility])    Recurrent boils    Residual foreign body in soft tissue    Vasculitis ([place])    a.  pauciimmune vasculitis with renal involvement and hx of transient hemoptysis in the past with associated BOOP, 2008 (renal bx 2008: GLN with vasculitis). b. History of treatment with 2 cycles of Cytoxan and pheresis. H/o hemoptysis and pulm hemorrhage with 2nd cycle of cytoxan.    SURGICAL HISTORY: Past Surgical History:  Procedure Laterality Date   BOWEL RESECTION N/A 07/12/2016   Procedure: SMALL BOWEL RESECTION;  Surgeon: Rolm Bookbinder, MD;  Location: [facility];  Service: General;  Laterality: N/A;   CATARACT EXTRACTION Bilateral    CORONARY ARTERY BYPASS GRAFT  2002   CORONARY STENT INTERVENTION N/A 03/31/2017   Procedure: CORONARY STENT INTERVENTION;  Surgeon: Martinique, Peter M, MD;  Location: [facility];  Service: Cardiovascular;  Laterality: N/A;   IR FLUORO GUIDE PORT INSERTION RIGHT  08/20/2016   IR REMOVAL TUN ACCESS W/ PORT W/O FL MOD SED  01/20/2017   IR US GUIDE VASC ACCESS RIGHT  08/20/2016   LAPAROSCOPY N/A 07/12/2016   Procedure: LAPAROSCOPY DIAGNOSTIC;  Surgeon: Rolm Bookbinder, MD;  Location: [facility];  Service: General;  Laterality: N/A;   LEFT HEART  CATHETERIZATION WITH CORONARY/GRAFT ANGIOGRAM N/A 07/26/2012   Procedure: LEFT HEART CATHETERIZATION WITH Beatrix Fetters;  Surgeon: Wellington Hampshire, MD;  Location: [facility];  Service: Cardiovascular;  Laterality: N/A;   LUNG BIOPSY     PERCUTANEOUS CORONARY STENT INTERVENTION (PCI-S)  07/26/2012   Procedure: PERCUTANEOUS CORONARY STENT INTERVENTION (PCI-S);  Surgeon: Wellington Hampshire, MD;  Location: [facility];  Service: Cardiovascular;;   RENAL BIOPSY     RIGHT/LEFT HEART CATH AND CORONARY/GRAFT ANGIOGRAPHY N/A 03/23/2017   Procedure: RIGHT/LEFT HEART CATH AND CORONARY/GRAFT ANGIOGRAPHY;  Surgeon: Martinique, Peter M, MD;  Location: [facility] CV LAB;  Service: Cardiovascular;  Laterality: N/A;   TOE AMPUTATION  2009   hammer toe    SOCIAL HISTORY: Social History   Socioeconomic History   Marital status: Married    Spouse name: Not on file   Number of children: Not on file   Years of education: Not on file   Highest education level: Not on file  Occupational History   Occupation: [employer]  Scientist, product/process development strain: Not on file   Food insecurity    Worry: Not on file    Inability: Not on file   Transportation needs    Medical: Not on file    Non-medical: Not on file  Tobacco Use   Smoking status: Never Smoker   Smokeless tobacco: Never Used  Substance and Sexual Activity   Alcohol use: No   Drug use: No   Sexual activity: Not on file  Lifestyle   Physical activity    Days per week: Not on file    Minutes per session: Not on file   Stress: Not on file  Relationships   Social connections    Talks on phone: Not on file    Gets together: Not on file    Attends religious service: Not on file    Active member of club or organization: Not on file    Attends meetings of clubs or organizations: Not on file    Relationship status: Not on file   Intimate partner violence    Fear of current or ex partner: Not on file     Emotionally abused: Not on file    Physically abused: Not on file    Forced sexual activity: Not on file  Other Topics Concern   Not on file  Social History Narrative   Regular exercise - yes      [facility] (07/12/16):   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  no pets currently. No bird or mold exposure.    FAMILY HISTORY: Family History  Problem Relation Age of Onset   Heart disease Mother    Diabetes Mother    Prostate cancer Father    Depression Other    Diabetes Other    Prostate cancer Other    Colon polyps Neg Hx    Colon cancer Neg Hx    Rectal cancer Neg Hx    Stomach cancer Neg Hx    Esophageal cancer Neg Hx     ALLERGIES:  has No Known Allergies.  MEDICATIONS:  Current Outpatient Medications  Medication Sig Dispense Refill   acetaminophen (TYLENOL) 500 MG tablet Take 500 mg by mouth daily as needed for moderate pain or headache.      aspirin EC 81 MG EC tablet Take 1 tablet (81 mg total) by mouth daily. 30 tablet    azaTHIOprine (IMURAN) 50 MG tablet Take 2 tablets (100 mg total) by mouth daily. 90 tablet 1   clopidogrel (PLAVIX) 75 MG tablet TAKE 1 TABLET BY MOUTH  DAILY 90 tablet 3   dorzolamide-timolol (COSOPT) 22.3-6.8 MG/ML ophthalmic solution Place 1 drop into both eyes 2 (two) times daily.      finasteride (PROSCAR) 5 MG tablet Take 5 mg by mouth at bedtime.      furosemide (LASIX) 20 MG tablet TAKE 1 TABLET BY MOUTH  DAILY 90 tablet 3   levothyroxine (SYNTHROID, LEVOTHROID) 100 MCG tablet Take 100 mcg by mouth daily before breakfast.      metoprolol succinate (TOPROL-XL) 25 MG 24 hr tablet TAKE 1 TABLET BY MOUTH  DAILY 90 tablet 3   Multiple Vitamin (MULTIVITAMIN) tablet Take 1 tablet by mouth daily.       nitroGLYCERIN (NITROSTAT) 0.4 MG SL tablet DISSOLVE 1 TABLET UNDER THE TONGUE EVERY 5 MINUTES AS  NEEDED FOR CHEST PAIN.  DO  NOT EXCEED A TOTAL OF 3  DOSES IN 15 MINUTES. 100 tablet 0  pantoprazole  (PROTONIX) 40 MG tablet TAKE 1 TABLET BY MOUTH  DAILY 90 tablet 2   rosuvastatin (CRESTOR) 10 MG tablet Take 10 mg every morning by mouth.     Tamsulosin HCl (FLOMAX) 0.4 MG CAPS Take 0.4 mg by mouth at bedtime.      Current Facility-Administered Medications  Medication Dose Route Frequency Provider Last Rate Last Dose   pneumococcal 23 valent vaccine (PNU-IMMUNE) injection 0.5 mL  0.5 mL Intramuscular Once Brunetta Genera, MD       Facility-Administered Medications Ordered in Other Visits  Medication Dose Route Frequency Provider Last Rate Last Dose   sodium chloride flush (NS) 0.9 % injection 10 mL  10 mL Intracatheter PRN Brunetta Genera, MD   10 mL at 08/24/16 1653   sodium chloride flush (NS) 0.9 % injection 10 mL  10 mL Intracatheter PRN Brunetta Genera, MD   10 mL at 10/07/16 1555    REVIEW OF SYSTEMS:   A 10+ POINT REVIEW OF SYSTEMS WAS OBTAINED including neurology, dermatology, psychiatry, cardiac, respiratory, lymph, extremities, GI, GU, Musculoskeletal, constitutional, breasts, reproductive, HEENT.  All pertinent positives are noted in the HPI.  All others are negative.   PHYSICAL EXAMINATION: ECOG PERFORMANCE STATUS: 2 - Symptomatic, <50% confined to bed  Vitals:   11/14/18 1023  BP: (!) 159/92  Pulse: 84  Resp: 19  Temp: 99.1 F (37.3 C)  SpO2: 98%   Filed Weights   11/14/18 1023  Weight: 232 lb 12.8 oz (105.6 kg)   .Body mass index is 31.57 kg/m.  GENERAL:alert, in no acute distress and comfortable SKIN: no acute rashes, no significant lesions EYES: conjunctiva are pink and non-injected, sclera anicteric OROPHARYNX: MMM, no exudates, no oropharyngeal erythema or ulceration NECK: supple, no JVD LYMPH:  no palpable lymphadenopathy in the cervical, axillary or inguinal regions LUNGS: clear to auscultation b/l with normal respiratory effort HEART: regular rate & rhythm ABDOMEN:  normoactive bowel sounds , non tender, not distended. No  palpable hepatosplenomegaly.  Extremity: 1+ pedal edema PSYCH: alert & oriented x 3 with fluent speech NEURO: no focal motor/sensory deficits   LABORATORY DATA:  I have reviewed the data as listed . CBC Latest Ref Rng & Units 11/14/2018 07/14/2018 04/12/2018  WBC 4.0 - 10.5 K/uL 4.9 4.8 5.0  Hemoglobin 13.0 - 17.0 g/dL 12.7(L) 12.2(L) 11.8(L)  Hematocrit 39.0 - 52.0 % 38.7(L) 38.4(L) 37.5(L)  Platelets 150 - 400 K/uL 172 166 191   . CMP Latest Ref Rng & Units 11/14/2018 07/14/2018 04/12/2018  Glucose 70 - 99 mg/dL 135(H) 110(H) 111(H)  BUN 8 - 23 mg/dL 28(H) 27(H) 31(H)  Creatinine 0.61 - 1.24 mg/dL 2.09(H) 1.97(H) 2.10(H)  Sodium 135 - 145 mmol/L 139 141 143  Potassium 3.5 - 5.1 mmol/L 4.3 4.8 4.3  Chloride 98 - 111 mmol/L 106 108 106  CO2 22 - 32 mmol/L 25 26 26   Calcium 8.9 - 10.3 mg/dL 8.6(L) 8.8(L) 9.0  Total Protein 6.5 - 8.1 g/dL 6.9 7.1 7.0  Total Bilirubin 0.3 - 1.2 mg/dL 0.4 0.6 0.6  Alkaline Phos 38 - 126 U/L 60 61 59  AST 15 - 41 U/L 16 15 16   ALT 0 - 44 U/L 16 9 12     Lab Results  Component Value Date   LDH 180 11/14/2018     RADIOGRAPHIC STUDIES: I have personally reviewed the radiological images as listed and agreed with the findings in the report.  Nm Pet Image Restag (ps) Skull Base To Thigh  Result Date: 10/22/2016 CLINICAL DATA:  Subsequent treatment strategy for diffuse large B-cell non-Hodgkin's lymphoma. EXAM: NUCLEAR MEDICINE PET SKULL BASE TO THIGH TECHNIQUE: 11.15 mCi F-18 FDG was injected intravenously. Full-ring PET imaging was performed from the skull base to thigh after the radiotracer. CT data was obtained and used for attenuation correction and anatomic localization. FASTING BLOOD GLUCOSE:  Value: 102 mg/dl COMPARISON:  None. FINDINGS: NECK No hypermetabolic lymph nodes in the neck. CHEST The FDG avid right axillary lymph nodes and epicardial lymph nodes have resolved in the interval. The focal hypermetabolic activity in the left ventricular  myocardium described on the previous study is unchanged. No new abnormalities in the chest. ABDOMEN/PELVIS Significant improvement in the abdomen. No definitive FDG avid disease remains in the liver or spleen. The adenopathy in the abdomen has resolved. Minimal uptake in the normal appearing left adrenal gland is of no significance. No abnormal uptake seen in the soft tissues of the pelvis. The omental nodularity seen previously has resolved. There is increased attenuation in the fat associated with the anterior ventral hernia with low level uptake, likely inflammatory. The maximum SUV is measured on image 161 is 3.7. SKELETON There has been significant overall improvement in the bones as well. Uptake in the anterior left acetabulum has significantly diminished with a maximum SUV of 3.4 today versus 29.4 previously. The left iliac uptake seen previously is no longer significantly FDG avid. There is mild focal uptake in the posterior left iliac bone on series 4, image 166 which was not seen previously and demonstrates a maximum SUV of 4.26 today. No other bony abnormalities are identified. IMPRESSION: 1. Significant interval improvement. No FDG avid disease remains in the soft tissues of the neck, chest, abdomen, or pelvis. Very mild uptake remains in the known left anterior acetabular lesion. Mild new uptake in the posterior left iliac bone is nonspecific but could represent mild involvement. Recommend attention on follow-up. 2. Increased attenuation and mild uptake in the fat of a lower midline ventral hernia is likely inflammatory. Recommend attention on follow-up. 3. No other interval change. Electronically Signed   By: Dorise Bullion III M.D   On: 10/22/2016 15:33   PET/CT 08/06/2016: IMPRESSION: 1. In addition to hypermetabolic nodal involvement of the right axilla, pericardial space, porta hepatis/mesenteric root, and retroperitoneum, there is extensive involvement of the liver as well as peritoneal  involvement along the liver surface, splenic surface, and upper omentum. 2. Hypermetabolic bony lesions in the left pelvis compatible with malignant involvement. 3. There is a focus of hypermetabolic activity along the anterolateral wall of the left ventricle. Normally such findings turn out to simply be hypermetabolic myocardium. Strictly speaking given how focal this appears I cannot completely exclude the possibility of tumor involvement within or along the myocardium.   Electronically Signed   By: Van Clines M.D.   On: 08/06/2016 12:48   PET/CT 12/29/2016  IMPRESSION: 1. No evidence of residual or recurrent hypermetabolic lymphoma. 2.  Aortic Atherosclerosis (ICD10-I70.0). 3. Dilatation of the infrarenal aorta and pelvic vasculature, as detailed above.   Electronically Signed   By: Abigail Miyamoto M.D.   On: 12/29/2016 15:17   ASSESSMENT & PLAN:   83 y.o.  male with multiple medical co-morbidities including cardiac co-morbidities, IPF, vascular on chronic immunosupression with    1) Stage IV Diffuse large B-cell lymphoma involving the small intestine (germinal cell phenotype) with complete thickness involvement of bowel and abutting serosa.- currently in remission.  Initial PET/CT scan showed extensive involvement  with DLBCL, hypermetabolic nodal involvement of the right axilla, pericardial space, porta hepatis/mesenteric root, and retroperitoneum, there is extensive involvement of the liver as well as peritoneal involvement along the liver surface, splenic surface, and upper omentum. 2. Hypermetabolic bony lesions in the left pelvis compatible with malignant involvement. 3. There is a focus of hypermetabolic activity along the anterolateral wall of the left ventricle.   s/p 6 cycles of R-CEOP .   PET/CT scan on 10/22/2016 after 3 cycles of chemotherapy shows excellent response to treatment. PET/CT scan on 12/29/2016 after 6 cycles of treatment shows No evidence of  residual or recurrent hypermetabolic lymphoma  A)Sh/oneutropenic fever and E.coli/pseudomonal bacteremia/HCAP after the first cycle of treatment.  Significant chemotherapy related thrombocytopenia - now resolved. B) h/o hospitalization for perforation diverticulitis - no issues currently  2) Chronic kidney disease stage III baseline creatinine about 2 to 2.5. - stable today 3) Coronary disease status post PCI and CABG - had a myocardial perfusion imaging scan on 06/30/2016 that showed a moderately decreased ejection fraction of 30-44%- following with cardiology for management. Was having some SOB/DOE and had cardiac cath with PCI to mid LAD on11/15/2018. 4) History of pulmonary and renal vasculitis back on chronic Imuran.  5) Neutropenia -resolved  6) Thrombocytopenia due to chemotherapy -resolved  7) Anemia - much improved hgb is up to 12.2  PLAN:  -Discussed pt labwork today, 11/14/18; blood counts are stable. -11/14/18 LDH is . Lab Results  Component Value Date   LDH 180 11/14/2018  -The pt shows no clinical or lab progression/return of his DLBCL at this time, and is nearly two years post treatment -No indication for further treatment at this time. -Recommend using a humidifier at night  -Prevnar in clinic today, 11/14/18 -Will give Pneumovax at next visit in 4 months -Continue follow up with nephrologist Dr. Servando Salina regarding vasculitis and Imuran  -Recommended continued f/u with dermatology for screening/management of his recurrent cutaneous SCC -Will see the pt back in 4 months  8) Arthritis, especially in right knee -Week of 09/18/17 he had fluid drawn form right knee and given cortisone shot by PCP -Encouraged him to be active with walking or PT            RTC with Dr Irene Limbo with labs in 4 months   All of the patients questions were answered with apparent satisfaction. The patient knows to call the clinic with any problems, questions or concerns.   The total time spent in  the appt was 20 minutes and more than 50% was on counseling and direct patient cares.    Sullivan Lone MD [place] AAHIVMS [facility] Hematology/Oncology Physician [facility]  (Office):       [phone] (Work cell):  [phone] (Fax):           [phone]  I, Baldwin Jamaica, am acting as a scribe for Dr. Sullivan Lone.   .I have reviewed the above documentation for accuracy and completeness, and I agree with the above. Brunetta Genera MD

## 2018-11-14 ENCOUNTER — Ambulatory Visit: Payer: Medicare Other

## 2018-11-14 ENCOUNTER — Inpatient Hospital Stay: Payer: Medicare Other | Attending: Hematology

## 2018-11-14 ENCOUNTER — Inpatient Hospital Stay (HOSPITAL_BASED_OUTPATIENT_CLINIC_OR_DEPARTMENT_OTHER): Payer: Medicare Other | Admitting: Hematology

## 2018-11-14 ENCOUNTER — Telehealth: Payer: Self-pay | Admitting: Hematology

## 2018-11-14 ENCOUNTER — Other Ambulatory Visit: Payer: Self-pay

## 2018-11-14 ENCOUNTER — Inpatient Hospital Stay: Payer: Medicare Other

## 2018-11-14 VITALS — BP 159/92 | HR 84 | Temp 99.1°F | Resp 19 | Ht 72.0 in | Wt 232.8 lb

## 2018-11-14 DIAGNOSIS — C8330 Diffuse large B-cell lymphoma, unspecified site: Secondary | ICD-10-CM

## 2018-11-14 DIAGNOSIS — D649 Anemia, unspecified: Secondary | ICD-10-CM | POA: Insufficient documentation

## 2018-11-14 DIAGNOSIS — Z7982 Long term (current) use of aspirin: Secondary | ICD-10-CM | POA: Insufficient documentation

## 2018-11-14 DIAGNOSIS — K219 Gastro-esophageal reflux disease without esophagitis: Secondary | ICD-10-CM

## 2018-11-14 DIAGNOSIS — I252 Old myocardial infarction: Secondary | ICD-10-CM | POA: Insufficient documentation

## 2018-11-14 DIAGNOSIS — N183 Chronic kidney disease, stage 3 (moderate): Secondary | ICD-10-CM

## 2018-11-14 DIAGNOSIS — Z951 Presence of aortocoronary bypass graft: Secondary | ICD-10-CM | POA: Insufficient documentation

## 2018-11-14 DIAGNOSIS — Z8249 Family history of ischemic heart disease and other diseases of the circulatory system: Secondary | ICD-10-CM | POA: Insufficient documentation

## 2018-11-14 DIAGNOSIS — Z79899 Other long term (current) drug therapy: Secondary | ICD-10-CM | POA: Insufficient documentation

## 2018-11-14 DIAGNOSIS — Z23 Encounter for immunization: Secondary | ICD-10-CM | POA: Diagnosis not present

## 2018-11-14 DIAGNOSIS — C8338 Diffuse large B-cell lymphoma, lymph nodes of multiple sites: Secondary | ICD-10-CM

## 2018-11-14 DIAGNOSIS — I129 Hypertensive chronic kidney disease with stage 1 through stage 4 chronic kidney disease, or unspecified chronic kidney disease: Secondary | ICD-10-CM

## 2018-11-14 LAB — CMP (CANCER CENTER ONLY)
ALT: 16 U/L (ref 0–44)
AST: 16 U/L (ref 15–41)
Albumin: 3.3 g/dL — ABNORMAL LOW (ref 3.5–5.0)
Alkaline Phosphatase: 60 U/L (ref 38–126)
Anion gap: 8 (ref 5–15)
BUN: 28 mg/dL — ABNORMAL HIGH (ref 8–23)
CO2: 25 mmol/L (ref 22–32)
Calcium: 8.6 mg/dL — ABNORMAL LOW (ref 8.9–10.3)
Chloride: 106 mmol/L (ref 98–111)
Creatinine: 2.09 mg/dL — ABNORMAL HIGH (ref 0.61–1.24)
GFR, Est AFR Am: 33 mL/min — ABNORMAL LOW (ref >60)
GFR, Estimated: 29 mL/min — ABNORMAL LOW (ref >60)
Glucose, Bld: 135 mg/dL — ABNORMAL HIGH (ref 70–99)
Potassium: 4.3 mmol/L (ref 3.5–5.1)
Sodium: 139 mmol/L (ref 135–145)
Total Bilirubin: 0.4 mg/dL (ref 0.3–1.2)
Total Protein: 6.9 g/dL (ref 6.5–8.1)

## 2018-11-14 LAB — CBC WITH DIFFERENTIAL/PLATELET
Abs Immature Granulocytes: 0.03 10*3/uL (ref 0.00–0.07)
Basophils Absolute: 0 10*3/uL (ref 0.0–0.1)
Basophils Relative: 1 %
Eosinophils Absolute: 0.1 10*3/uL (ref 0.0–0.5)
Eosinophils Relative: 2 %
HCT: 38.7 % — ABNORMAL LOW (ref 39.0–52.0)
Hemoglobin: 12.7 g/dL — ABNORMAL LOW (ref 13.0–17.0)
Immature Granulocytes: 1 %
Lymphocytes Relative: 5 %
Lymphs Abs: 0.2 10*3/uL — ABNORMAL LOW (ref 0.7–4.0)
MCH: 35.4 pg — ABNORMAL HIGH (ref 26.0–34.0)
MCHC: 32.8 g/dL (ref 30.0–36.0)
MCV: 107.8 fL — ABNORMAL HIGH (ref 80.0–100.0)
Monocytes Absolute: 0.5 10*3/uL (ref 0.1–1.0)
Monocytes Relative: 11 %
Neutro Abs: 4 10*3/uL (ref 1.7–7.7)
Neutrophils Relative %: 80 %
Platelets: 172 10*3/uL (ref 150–400)
RBC: 3.59 MIL/uL — ABNORMAL LOW (ref 4.22–5.81)
RDW: 15.9 % — ABNORMAL HIGH (ref 11.5–15.5)
WBC: 4.9 10*3/uL (ref 4.0–10.5)
nRBC: 0 % (ref 0.0–0.2)

## 2018-11-14 LAB — LACTATE DEHYDROGENASE: LDH: 180 U/L (ref 98–192)

## 2018-11-14 MED ORDER — PNEUMOCOCCAL VAC POLYVALENT 25 MCG/0.5ML IJ INJ: 0.5000 mL | INJECTION | Freq: Once | INTRAMUSCULAR | Status: DC

## 2018-11-14 MED ORDER — PNEUMOCOCCAL 13-VAL CONJ VACC IM SUSP
0.5000 mL | Freq: Once | INTRAMUSCULAR | Status: AC
  Administered 2018-11-14: 0.5 mL via INTRAMUSCULAR
  Filled 2018-11-14: qty 0.5

## 2018-11-14 NOTE — Patient Instructions (Signed)
Pneumococcal Conjugate Vaccine suspension for injection What is this medicine? PNEUMOCOCCAL VACCINE (NEU mo KOK al vak SEEN) is a vaccine used to prevent pneumococcus bacterial infections. These bacteria can cause serious infections like pneumonia, meningitis, and blood infections. This vaccine will lower your chance of getting pneumonia. If you do get pneumonia, it can make your symptoms milder and your illness shorter. This vaccine will not treat an infection and will not cause infection. This vaccine is recommended for infants and young children, adults with certain medical conditions, and adults 65 years or older. This medicine may be used for other purposes; ask your health care provider or pharmacist if you have questions. COMMON BRAND NAME(S): Prevnar, Prevnar 13 What should I tell my health care provider before I take this medicine? They need to know if you have any of these conditions:  bleeding problems  fever  immune system problems  an unusual or allergic reaction to pneumococcal vaccine, diphtheria toxoid, other vaccines, latex, other medicines, foods, dyes, or preservatives  pregnant or trying to get pregnant  breast-feeding How should I use this medicine? This vaccine is for injection into a muscle. It is given by a health care professional. A copy of Vaccine Information Statements will be given before each vaccination. Read this sheet carefully each time. The sheet may change frequently. Talk to your pediatrician regarding the use of this medicine in children. While this drug may be prescribed for children as young as 6 weeks old for selected conditions, precautions do apply. Overdosage: If you think you have taken too much of this medicine contact a poison control center or emergency room at once. NOTE: This medicine is only for you. Do not share this medicine with others. What if I miss a dose? It is important not to miss your dose. Call your doctor or health care  professional if you are unable to keep an appointment. What may interact with this medicine?  medicines for cancer chemotherapy  medicines that suppress your immune function  steroid medicines like prednisone or cortisone This list may not describe all possible interactions. Give your health care provider a list of all the medicines, herbs, non-prescription drugs, or dietary supplements you use. Also tell them if you smoke, drink alcohol, or use illegal drugs. Some items may interact with your medicine. What should I watch for while using this medicine? Mild fever and pain should go away in 3 days or less. Report any unusual symptoms to your doctor or health care professional. What side effects may I notice from receiving this medicine? Side effects that you should report to your doctor or health care professional as soon as possible:  allergic reactions like skin rash, itching or hives, swelling of the face, lips, or tongue  breathing problems  confused  fast or irregular heartbeat  fever over 102 degrees F  seizures  unusual bleeding or bruising  unusual muscle weakness Side effects that usually do not require medical attention (report to your doctor or health care professional if they continue or are bothersome):  aches and pains  diarrhea  fever of 102 degrees F or less  headache  irritable  loss of appetite  pain, tender at site where injected  trouble sleeping This list may not describe all possible side effects. Call your doctor for medical advice about side effects. You may report side effects to FDA at 1-800-FDA-1088. Where should I keep my medicine? This does not apply. This vaccine is given in a clinic, pharmacy, doctor's office,   or other health care setting and will not be stored at home. NOTE: This sheet is a summary. It may not cover all possible information. If you have questions about this medicine, talk to your doctor, pharmacist, or health care  provider.  2020 Elsevier/Gold Standard (2014-02-07 10:27:27)  

## 2018-11-14 NOTE — Telephone Encounter (Signed)
Scheduled appt per 6/30 los. °Printed calendar and avs. °

## 2018-11-15 DIAGNOSIS — L570 Actinic keratosis: Secondary | ICD-10-CM | POA: Diagnosis not present

## 2018-11-15 DIAGNOSIS — C44319 Basal cell carcinoma of skin of other parts of face: Secondary | ICD-10-CM | POA: Diagnosis not present

## 2018-11-15 DIAGNOSIS — Z08 Encounter for follow-up examination after completed treatment for malignant neoplasm: Secondary | ICD-10-CM | POA: Diagnosis not present

## 2018-11-15 DIAGNOSIS — X32XXXD Exposure to sunlight, subsequent encounter: Secondary | ICD-10-CM | POA: Diagnosis not present

## 2018-11-15 DIAGNOSIS — Z85828 Personal history of other malignant neoplasm of skin: Secondary | ICD-10-CM | POA: Diagnosis not present

## 2018-11-15 DIAGNOSIS — C4441 Basal cell carcinoma of skin of scalp and neck: Secondary | ICD-10-CM | POA: Diagnosis not present

## 2018-12-22 ENCOUNTER — Other Ambulatory Visit: Payer: Self-pay | Admitting: Cardiovascular Disease

## 2018-12-27 ENCOUNTER — Other Ambulatory Visit: Payer: Self-pay | Admitting: Cardiovascular Disease

## 2019-01-04 NOTE — Progress Notes (Signed)
Cardiology Office Note   Date:  01/17/2019   ID:  Luke Mcbride, DOB 06/04/1935, MRN [id number]  PCP:  Donato Heinz, MD  Cardiologist:  Dr. [name]    No chief complaint on file.     History of Present Illness: Luke Mcbride is a 83 y.o. male who presents for f/u CAD/CABG 2002, DM, HTN, HLD and CKD  He was admitted 07/2012 with NSTEMI and underwent BMS to the SVR-PDA/PL. His LIMA to LAD was atretic but disease in the LAD was moderate. SVG-OM and SVG to diagonal were known to be occluded.  06/21/16 myovue scar no ischemia low risk  However he was admitted for angina and had cath 03/23/17 with new native mid LAD lesion that was stented due to atretic LIMA Echo with EF 35-40% September 2019 had mild CHF when diuretic held by nephrology. Has done well with 20 mg lasix daily and baseline Cr around 2.0 Sees [place]  Diagnosed with Stage IV Diffuse large B-cell lymphoma involving the small intestine.. He had 6 inchesof his colon resected. Following surgery he had 6 rounds of chemo. F/u PET scan and was told he was in remission.    Activity limited by right knee arthritis and neuropathy likely from chemo therapy   No angina compliant with meds   Past Medical History:  Diagnosis Date  . ALLERGIC RHINITIS   . Anemia    hx  . Barrett's esophagus   . BOOP (bronchiolitis obliterans with organizing pneumonia) ([place])    a. s/p R VATS 2008.  [name] CAD (coronary artery disease)    a. 05/2000: NSTEMI/CABG x 6: LIMA->LAD, VG->D1, VG->OM1->2, VG->PDA->RPL;  b. 07/2007 MV: high lat infarct, no ischemia, EF 47%;  c. Cath/PCI: LM nl, LAD20p, 54m, D1 nl, D2 60-70ost, D3 nl, LCX 70ost, 138m, OM1/OM2 min irregs, RCA 30 diff, PDA 99, LIMA->LAD atretic, VG->D1 100, VG->OM1->2 100, VG->PDA->RPL 90p (4.0x23 Vision BMS);  c. 07/2012 Echo: EF 55%, gr1 DD.  [name] Cataract   . CKD (chronic kidney disease), stage III ([place])    a. renal bx 2008: GLN with vasculitis  . Diffuse large B cell lymphoma ([place]) 08/07/2016  .  GERD (gastroesophageal reflux disease)   . Glaucoma    a. Cannot see out of L eye.  . Hematuria    Microscopic  . Hyperglycemia    Patient reported while on prednisone, had to take insulin  . Hyperlipemia   . Hypertension   . Hypothyroidism   . ILD (interstitial lung disease) ([place])   . Iritis   . Local infection of skin and subcutaneous tissue   . Membranoproliferative nephritis   . Myocardial infarction ([place]) 2002  . Neutropenia, drug-induced ([place])   . Recurrent boils   . Residual foreign body in soft tissue   . Vasculitis ([place])    a.  pauciimmune vasculitis with renal involvement and hx of transient hemoptysis in the past with associated BOOP, 2008 (renal bx 2008: GLN with vasculitis). b. History of treatment with 2 cycles of Cytoxan and pheresis. H/o hemoptysis and pulm hemorrhage with 2nd cycle of cytoxan.    Past Surgical History:  Procedure Laterality Date  . BOWEL RESECTION N/A 07/12/2016   Procedure: SMALL BOWEL RESECTION;  Surgeon: Rolm Bookbinder, MD;  Location: [facility];  Service: General;  Laterality: N/A;  . CATARACT EXTRACTION Bilateral   . CORONARY ARTERY BYPASS GRAFT  2002  . CORONARY STENT INTERVENTION N/A 03/31/2017   Procedure: CORONARY STENT INTERVENTION;  Surgeon: Martinique, Bernice Mullin M, MD;  Location: [facility];  Service: Cardiovascular;  Laterality: N/A;  . IR FLUORO GUIDE PORT INSERTION RIGHT  08/20/2016  . IR REMOVAL TUN ACCESS W/ PORT W/O FL MOD SED  01/20/2017  . IR US GUIDE VASC ACCESS RIGHT  08/20/2016  . LAPAROSCOPY N/A 07/12/2016   Procedure: LAPAROSCOPY DIAGNOSTIC;  Surgeon: Rolm Bookbinder, MD;  Location: [facility];  Service: General;  Laterality: N/A;  . LEFT HEART CATHETERIZATION WITH CORONARY/GRAFT ANGIOGRAM N/A 07/26/2012   Procedure: LEFT HEART CATHETERIZATION WITH Beatrix Fetters;  Surgeon: Wellington Hampshire, MD;  Location:  CATH LAB;  Service: Cardiovascular;  Laterality: N/A;  . LUNG BIOPSY    . PERCUTANEOUS CORONARY STENT INTERVENTION  (PCI-S)  07/26/2012   Procedure: PERCUTANEOUS CORONARY STENT INTERVENTION (PCI-S);  Surgeon: Wellington Hampshire, MD;  Location: [facility] CATH [facility];  Service: Cardiovascular;;  . RENAL BIOPSY    . RIGHT/LEFT HEART CATH AND CORONARY/GRAFT ANGIOGRAPHY N/A 03/23/2017   Procedure: RIGHT/LEFT HEART CATH AND CORONARY/GRAFT ANGIOGRAPHY;  Surgeon: Martinique, Malaky Tetrault M, MD;  Location: [facility];  Service: Cardiovascular;  Laterality: N/A;  . TOE AMPUTATION  2009   hammer toe     Current Outpatient Medications  Medication Sig Dispense Refill  . acetaminophen (TYLENOL) 500 MG tablet Take 500 mg by mouth daily as needed for moderate pain or headache.     [name] aspirin EC 81 MG EC tablet Take 1 tablet (81 mg total) by mouth daily. 30 tablet   . azaTHIOprine (IMURAN) 50 MG tablet Take 2 tablets (100 mg total) by mouth daily. 90 tablet 1  . clopidogrel (PLAVIX) 75 MG tablet Take 1 tablet (75 mg total) by mouth daily. Please schedule appt for any future refills. 1st attempt. 90 tablet 0  . dorzolamide-timolol (COSOPT) 22.3-6.8 MG/ML ophthalmic solution Place 1 drop into both eyes 2 (two) times daily.     . finasteride (PROSCAR) 5 MG tablet Take 5 mg by mouth at bedtime.     . furosemide (LASIX) 20 MG tablet TAKE 1 TABLET BY MOUTH  DAILY 90 tablet 3  . levothyroxine (SYNTHROID, LEVOTHROID) 100 MCG tablet Take 100 mcg by mouth daily before breakfast.     . metoprolol succinate (TOPROL-XL) 25 MG 24 hr tablet TAKE 1 TABLET BY MOUTH  DAILY 90 tablet 3  . Multiple Vitamin (MULTIVITAMIN) tablet Take 1 tablet by mouth daily.      . nitroGLYCERIN (NITROSTAT) 0.4 MG SL tablet DISSOLVE 1 TABLET UNDER THE TONGUE EVERY 5 MINUTES AS  NEEDED FOR CHEST PAIN.  DO  NOT EXCEED A TOTAL OF 3  DOSES IN 15 MINUTES. 100 tablet 0  . pantoprazole (PROTONIX) 40 MG tablet Take 1 tablet (40 mg total) by mouth daily. Please keep upcoming appt in September for future refills. Thank you 90 tablet 1  . rosuvastatin (CRESTOR) 10 MG tablet Take 10 mg  every morning by mouth.    . Tamsulosin HCl (FLOMAX) 0.4 MG CAPS Take 0.4 mg by mouth at bedtime.      No current facility-administered medications for this visit.    Facility-Administered Medications Ordered in Other Visits  Medication Dose Route Frequency Provider Last Rate Last Dose  . sodium chloride flush (NS) 0.9 % injection 10 mL  10 mL Intracatheter PRN Brunetta Genera, MD   10 mL at 08/24/16 1653  . sodium chloride flush (NS) 0.9 % injection 10 mL  10 mL Intracatheter PRN Brunetta Genera, MD   10 mL at 10/07/16 1555    Allergies:  Patient has no known allergies.    Social History:  The patient  reports that he has never smoked. He has never used smokeless tobacco. He reports that he does not drink alcohol or use drugs.   Family History:  The patient's family history includes Depression in an other family member; Diabetes in his mother and another family member; Heart disease in his mother; Prostate cancer in his father and another family member.    ROS:  General:no colds or fevers, no weight changes Skin:no rashes or ulcers HEENT:no blurred vision, no congestion CV:see HPI PUL:see HPI GI:no diarrhea constipation or melena, no indigestion GU:no hematuria, no dysuria MS:no joint pain, no claudication Neuro:no syncope, no lightheadedness Endo:no diabetes, + thyroid disease  Wt Readings from Last 3 Encounters:  01/17/19 236 lb 6.4 oz (107.2 kg)  11/14/18 232 lb 12.8 oz (105.6 kg)  07/14/18 228 lb 12.8 oz (103.8 kg)     PHYSICAL EXAM: VS:  BP (!) 144/98   Pulse 81   Ht 6' (1.829 m)   Wt 236 lb 6.4 oz (107.2 kg)   SpO2 95%   BMI 32.06 kg/m  , BMI Body mass index is 32.06 kg/m. Affect appropriate Chronically ill white male  HEENT: normal Neck supple with no adenopathy JVP normal no bruits no thyromegaly Lungs clear with no wheezing and good diaphragmatic motion Heart:  S1/S2 no murmur, no rub, gallop or click PMI normal post sternotomy  Abdomen:  benighn, BS positve, no tenderness, no AAA no bruit.  No HSM or HJR Distal pulses intact with no bruits Plus one bilateral  Edema a bit worse on right  Neuro non-focal Skin warm and dry No muscular weakness    EKG: 01/17/19 SR rate 81 nonspecific ST changes QT 402    Recent Labs: 01/29/2018: B Natriuretic Peptide 427.2 11/14/2018: ALT 16; BUN 28; Creatinine 2.09; Hemoglobin 12.7; Platelets 172; Potassium 4.3; Sodium 139    Lipid Panel    Component Value Date/Time   CHOL 157 02/11/2014 0733   TRIG 150.0 (H) 02/11/2014 0733   HDL 36.30 (L) 02/11/2014 0733   CHOLHDL 4 02/11/2014 0733   VLDL 30.0 02/11/2014 0733   LDLCALC 91 02/11/2014 0733   LDLDIRECT 221.7 05/23/2007 0000       Other studies Reviewed: Additional studies/ records that were reviewed today include:  Echo 01/30/18 EF 35-40%inferior MI trivial MR Moderate bi atrial enlargement Cath 03/23/17  Atretic LIMA to LAD Native LAD stented SVG D1 occluded small native vessel Patent SVG to PDA/PLB with stent Occluded SVG to OM   ASSESSMENT AND PLAN:   1. CAD/CABG:  Stable no angina DES to native LAD 04/03/17 DAT but can stop plavix now   2. Lymphoma:  Diffuse B cell intestinal post surgical resection and chemo Rx in remission Seen by oncology 04/12/18 improved counts no clinical or lab progression no active Rx at this time   3. Edema: from arthritis and previous vein stripping CABG duplex with no DVT   4. CRF:  Diuretics cut back by nephrology  Cr 2.1  11/14/18 f/u Dr Azzie Roup  5. Thyroid:  On synthroid replacement f/u primary TSH   6. CHF: Hospitalized 01/29/18 after renal stopped diuretics. TTE with stable EF 35-40% cleared quickly With iv lasix On PO lasix 20 mg unable to use ACE/ARB due to renal failure    F/u with me in 6 months   Jenkins Rouge

## 2019-01-17 ENCOUNTER — Encounter: Payer: Self-pay | Admitting: Cardiovascular Disease

## 2019-01-17 ENCOUNTER — Ambulatory Visit (INDEPENDENT_AMBULATORY_CARE_PROVIDER_SITE_OTHER): Payer: Medicare Other | Admitting: Cardiovascular Disease

## 2019-01-17 ENCOUNTER — Other Ambulatory Visit: Payer: Self-pay

## 2019-01-17 VITALS — BP 144/98 | HR 81 | Ht 72.0 in | Wt 236.4 lb

## 2019-01-17 DIAGNOSIS — I10 Essential (primary) hypertension: Secondary | ICD-10-CM | POA: Diagnosis not present

## 2019-01-17 DIAGNOSIS — I5043 Acute on chronic combined systolic (congestive) and diastolic (congestive) heart failure: Secondary | ICD-10-CM

## 2019-01-17 DIAGNOSIS — Z951 Presence of aortocoronary bypass graft: Secondary | ICD-10-CM

## 2019-01-17 DIAGNOSIS — I25118 Atherosclerotic heart disease of native coronary artery with other forms of angina pectoris: Secondary | ICD-10-CM | POA: Diagnosis not present

## 2019-01-17 DIAGNOSIS — I2510 Atherosclerotic heart disease of native coronary artery without angina pectoris: Secondary | ICD-10-CM

## 2019-01-17 NOTE — Patient Instructions (Signed)
Your physician recommends that you continue on your current medications as directed. Please refer to the Current Medication list given to you today.   Your physician wants you to follow-up in:  6 MONTHS WITH DR NISHAN  You will receive a reminder letter in the mail two months in advance. If you don't receive a letter, please call our office to schedule the follow-up appointment. 

## 2019-01-30 DIAGNOSIS — L570 Actinic keratosis: Secondary | ICD-10-CM | POA: Diagnosis not present

## 2019-01-30 DIAGNOSIS — X32XXXD Exposure to sunlight, subsequent encounter: Secondary | ICD-10-CM | POA: Diagnosis not present

## 2019-01-30 DIAGNOSIS — Z08 Encounter for follow-up examination after completed treatment for malignant neoplasm: Secondary | ICD-10-CM | POA: Diagnosis not present

## 2019-01-30 DIAGNOSIS — D0472 Carcinoma in situ of skin of left lower limb, including hip: Secondary | ICD-10-CM | POA: Diagnosis not present

## 2019-01-30 DIAGNOSIS — C44319 Basal cell carcinoma of skin of other parts of face: Secondary | ICD-10-CM | POA: Diagnosis not present

## 2019-01-30 DIAGNOSIS — Z85828 Personal history of other malignant neoplasm of skin: Secondary | ICD-10-CM | POA: Diagnosis not present

## 2019-02-26 DIAGNOSIS — H40023 Open angle with borderline findings, high risk, bilateral: Secondary | ICD-10-CM | POA: Diagnosis not present

## 2019-02-26 DIAGNOSIS — Z9842 Cataract extraction status, left eye: Secondary | ICD-10-CM | POA: Diagnosis not present

## 2019-02-26 DIAGNOSIS — H401131 Primary open-angle glaucoma, bilateral, mild stage: Secondary | ICD-10-CM | POA: Diagnosis not present

## 2019-02-26 DIAGNOSIS — H401133 Primary open-angle glaucoma, bilateral, severe stage: Secondary | ICD-10-CM | POA: Diagnosis not present

## 2019-02-26 DIAGNOSIS — H44512 Absolute glaucoma, left eye: Secondary | ICD-10-CM | POA: Diagnosis not present

## 2019-02-26 DIAGNOSIS — Z9841 Cataract extraction status, right eye: Secondary | ICD-10-CM | POA: Diagnosis not present

## 2019-02-26 DIAGNOSIS — H5211 Myopia, right eye: Secondary | ICD-10-CM | POA: Diagnosis not present

## 2019-02-26 DIAGNOSIS — H401211 Low-tension glaucoma, right eye, mild stage: Secondary | ICD-10-CM | POA: Diagnosis not present

## 2019-02-27 ENCOUNTER — Other Ambulatory Visit: Payer: Self-pay | Admitting: Cardiovascular Disease

## 2019-03-13 DIAGNOSIS — L820 Inflamed seborrheic keratosis: Secondary | ICD-10-CM | POA: Diagnosis not present

## 2019-03-13 DIAGNOSIS — Z85828 Personal history of other malignant neoplasm of skin: Secondary | ICD-10-CM | POA: Diagnosis not present

## 2019-03-13 DIAGNOSIS — Z08 Encounter for follow-up examination after completed treatment for malignant neoplasm: Secondary | ICD-10-CM | POA: Diagnosis not present

## 2019-03-13 DIAGNOSIS — X32XXXD Exposure to sunlight, subsequent encounter: Secondary | ICD-10-CM | POA: Diagnosis not present

## 2019-03-13 DIAGNOSIS — L570 Actinic keratosis: Secondary | ICD-10-CM | POA: Diagnosis not present

## 2019-03-16 ENCOUNTER — Inpatient Hospital Stay (HOSPITAL_BASED_OUTPATIENT_CLINIC_OR_DEPARTMENT_OTHER): Payer: Medicare Other | Admitting: Hematology

## 2019-03-16 ENCOUNTER — Inpatient Hospital Stay: Payer: Medicare Other | Attending: Hematology

## 2019-03-16 ENCOUNTER — Other Ambulatory Visit: Payer: Self-pay

## 2019-03-16 VITALS — BP 131/76 | HR 88 | Temp 98.5°F | Resp 18 | Ht 72.0 in | Wt 239.4 lb

## 2019-03-16 DIAGNOSIS — I129 Hypertensive chronic kidney disease with stage 1 through stage 4 chronic kidney disease, or unspecified chronic kidney disease: Secondary | ICD-10-CM | POA: Insufficient documentation

## 2019-03-16 DIAGNOSIS — C8338 Diffuse large B-cell lymphoma, lymph nodes of multiple sites: Secondary | ICD-10-CM | POA: Insufficient documentation

## 2019-03-16 DIAGNOSIS — Z833 Family history of diabetes mellitus: Secondary | ICD-10-CM | POA: Insufficient documentation

## 2019-03-16 DIAGNOSIS — R7881 Bacteremia: Secondary | ICD-10-CM | POA: Insufficient documentation

## 2019-03-16 DIAGNOSIS — M1711 Unilateral primary osteoarthritis, right knee: Secondary | ICD-10-CM | POA: Diagnosis not present

## 2019-03-16 DIAGNOSIS — Z9049 Acquired absence of other specified parts of digestive tract: Secondary | ICD-10-CM | POA: Insufficient documentation

## 2019-03-16 DIAGNOSIS — E785 Hyperlipidemia, unspecified: Secondary | ICD-10-CM | POA: Insufficient documentation

## 2019-03-16 DIAGNOSIS — I2510 Atherosclerotic heart disease of native coronary artery without angina pectoris: Secondary | ICD-10-CM

## 2019-03-16 DIAGNOSIS — K219 Gastro-esophageal reflux disease without esophagitis: Secondary | ICD-10-CM | POA: Insufficient documentation

## 2019-03-16 DIAGNOSIS — K439 Ventral hernia without obstruction or gangrene: Secondary | ICD-10-CM | POA: Diagnosis not present

## 2019-03-16 DIAGNOSIS — Z79899 Other long term (current) drug therapy: Secondary | ICD-10-CM | POA: Diagnosis not present

## 2019-03-16 DIAGNOSIS — Z818 Family history of other mental and behavioral disorders: Secondary | ICD-10-CM | POA: Insufficient documentation

## 2019-03-16 DIAGNOSIS — I252 Old myocardial infarction: Secondary | ICD-10-CM | POA: Diagnosis not present

## 2019-03-16 DIAGNOSIS — I700 Atherosclerosis of aorta: Secondary | ICD-10-CM | POA: Insufficient documentation

## 2019-03-16 DIAGNOSIS — Z8249 Family history of ischemic heart disease and other diseases of the circulatory system: Secondary | ICD-10-CM | POA: Insufficient documentation

## 2019-03-16 DIAGNOSIS — C8330 Diffuse large B-cell lymphoma, unspecified site: Secondary | ICD-10-CM

## 2019-03-16 DIAGNOSIS — Z8719 Personal history of other diseases of the digestive system: Secondary | ICD-10-CM | POA: Diagnosis not present

## 2019-03-16 DIAGNOSIS — N183 Chronic kidney disease, stage 3 unspecified: Secondary | ICD-10-CM | POA: Diagnosis not present

## 2019-03-16 DIAGNOSIS — Z8042 Family history of malignant neoplasm of prostate: Secondary | ICD-10-CM | POA: Diagnosis not present

## 2019-03-16 LAB — CMP (CANCER CENTER ONLY)
ALT: 16 U/L (ref 0–44)
AST: 15 U/L (ref 15–41)
Albumin: 3.3 g/dL — ABNORMAL LOW (ref 3.5–5.0)
Alkaline Phosphatase: 64 U/L (ref 38–126)
Anion gap: 10 (ref 5–15)
BUN: 32 mg/dL — ABNORMAL HIGH (ref 8–23)
CO2: 23 mmol/L (ref 22–32)
Calcium: 8.7 mg/dL — ABNORMAL LOW (ref 8.9–10.3)
Chloride: 107 mmol/L (ref 98–111)
Creatinine: 2.22 mg/dL — ABNORMAL HIGH (ref 0.61–1.24)
GFR, Est AFR Am: 31 mL/min — ABNORMAL LOW (ref >60)
GFR, Estimated: 27 mL/min — ABNORMAL LOW (ref >60)
Glucose, Bld: 140 mg/dL — ABNORMAL HIGH (ref 70–99)
Potassium: 4 mmol/L (ref 3.5–5.1)
Sodium: 140 mmol/L (ref 135–145)
Total Bilirubin: 0.5 mg/dL (ref 0.3–1.2)
Total Protein: 6.9 g/dL (ref 6.5–8.1)

## 2019-03-16 LAB — CBC WITH DIFFERENTIAL/PLATELET
Abs Immature Granulocytes: 0.02 10*3/uL (ref 0.00–0.07)
Basophils Absolute: 0 10*3/uL (ref 0.0–0.1)
Basophils Relative: 1 %
Eosinophils Absolute: 0.1 10*3/uL (ref 0.0–0.5)
Eosinophils Relative: 2 %
HCT: 39.1 % (ref 39.0–52.0)
Hemoglobin: 12.9 g/dL — ABNORMAL LOW (ref 13.0–17.0)
Immature Granulocytes: 0 %
Lymphocytes Relative: 6 %
Lymphs Abs: 0.3 10*3/uL — ABNORMAL LOW (ref 0.7–4.0)
MCH: 34.9 pg — ABNORMAL HIGH (ref 26.0–34.0)
MCHC: 33 g/dL (ref 30.0–36.0)
MCV: 105.7 fL — ABNORMAL HIGH (ref 80.0–100.0)
Monocytes Absolute: 0.6 10*3/uL (ref 0.1–1.0)
Monocytes Relative: 12 %
Neutro Abs: 4.2 10*3/uL (ref 1.7–7.7)
Neutrophils Relative %: 79 %
Platelets: 144 10*3/uL — ABNORMAL LOW (ref 150–400)
RBC: 3.7 MIL/uL — ABNORMAL LOW (ref 4.22–5.81)
RDW: 15.7 % — ABNORMAL HIGH (ref 11.5–15.5)
WBC: 5.3 10*3/uL (ref 4.0–10.5)
nRBC: 0 % (ref 0.0–0.2)

## 2019-03-16 LAB — LACTATE DEHYDROGENASE: LDH: 190 U/L (ref 98–192)

## 2019-03-16 NOTE — Progress Notes (Signed)
HEMATOLOGY/ONCOLOGY CLINIC NOTE  Date of Service: 03/16/19  Patient Care Team: Donato Heinz, MD as PCP - General (Nephrology) [name], MD as PCP - Cardiology (Cardiology) Brunetta Genera, MD as Consulting Physician (Hematology)  CHIEF COMPLAINTS/PURPOSE OF CONSULTATION:  F/u for Diffuse large B cell lymphoma of the small intestine  HISTORY OF PRESENTING ILLNESS:   Luke Mcbride is a wonderful 83 y.o. male who has been referred to [place] by Dr .Donato Heinz, MD and Mauricio Po MD for evaluation and management of newly diagnosed diffuse large B-cell lymphoma of the small bowel.  Patient has a history of multiple medical comorbidities as noted below including coronary artery disease status post PCI and CABG, chronic kidney disease stage III, Barrett's esophagus, hypertension, dyslipidemia, interstitial lung disease, GERD, vasculitis involving the lungs and kidneys on chronic Imuran therapy.  He was having upper abdominal discomfort for a few months with loss of about 15-20 pounds. He had a CT scan of the abdomen done by Dr. Havery Moros on 06/15/2016 which showed a 3.3 x 3.2 x 2.4 cm indeterminate lesion involving the left upper quadrant small bowel loops. No additional small bowel lesions noted. No ascites. No retroperitoneal mesenteric or pelvic lymphadenopathy noted.  Patient was seen by Dr. Serita Grammes and underwent a diagnostic laparoscopy with small bowel resection on 07/12/2016. Pathology showed that the lesion was consistent with diffuse large B-cell lymphoma 2 cm in length and 4 cm in width centrally ulcerated mass involving 100% of the bowel circumference. The tumor was about 1 cm thick invading through the entire wall and into the underlying mesenteric fat. The tumor also abuts and focally involve the serosa. No extramural satellite lymph nodes noted. Did not seem to involve the peri-intestinal lymph nodes. Tumor was noted to have a germinal center  phenotype.  Patient is healing well from surgery. Continues to be on Imuran 100 mg by mouth daily. Has not reported any other peripheral enlarged lymph nodes. He is starting to eat better.   INTERVAL HISTORY   Luke Mcbride is here for follow-up for his Diffuse large B cell lymphoma of the small intestine. The patient's last visit with [place] was on 11/14/2018. The pt reports that he is doing well overall.  The pt reports he is staying active and avoiding crowds.  Dermatologist has biopsied a lesion on his leg. He is waiting for the results.  Lab results today (03/16/19) of CBC w/diff and CMP is as follows: all values are WNL except for RBC at 3.7, Hemoglobin at 12.9, MCV at 105.7, Mch at 34.9, RDW at 15.7, Platelets at 144, Lymphs Abs at 0.3, Glucose Bld at 140, BUN at 32, Creatinine at 2.22, Calcium at 8.7, Albumin at 3.3, GFR Est Non Af Am at 27, GFR Est AFR Am at 31.  On review of systems, pt denies no sudden weight loss and any other symptoms.   Sees Dr. Nevada Crane for his dermatology f/u.  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 ALLERGIC RHINITIS   . Anemia    hx  . Barrett's esophagus   . BOOP (bronchiolitis obliterans with organizing pneumonia) ([place])    a. s/p R VATS 2008.  [name] CAD (coronary artery disease)    a. 05/2000: NSTEMI/CABG x 6: LIMA->LAD, VG->D1, VG->OM1->2, VG->PDA->RPL;  b. 07/2007 MV: high lat infarct, no ischemia, EF 47%;  c. Cath/PCI: LM nl, LAD20p, 72m, D1 nl, D2 60-70ost, D3 nl, LCX  70ost, 17m, OM1/OM2 min irregs, RCA 30 diff, PDA 99, LIMA->LAD atretic, VG->D1 100, VG->OM1->2 100, VG->PDA->RPL 90p (4.0x23 Vision BMS);  c. 07/2012 Echo: EF 55%, gr1 DD.  [name] Cataract   . CKD (chronic kidney disease), stage III ([place])    a. renal bx 2008: GLN with vasculitis  . Diffuse large B cell lymphoma ([place]) 08/07/2016  . GERD (gastroesophageal reflux disease)   . Glaucoma    a. Cannot see out of L eye.  . Hematuria    Microscopic   . Hyperglycemia    Patient reported while on prednisone, had to take insulin  . Hyperlipemia   . Hypertension   . Hypothyroidism   . ILD (interstitial lung disease) ([place])   . Iritis   . Local infection of skin and subcutaneous tissue   . Membranoproliferative nephritis   . Myocardial infarction ([place]) 2002  . Neutropenia, drug-induced ([place])   . Recurrent boils   . Residual foreign body in soft tissue   . Vasculitis ([place])    a.  pauciimmune vasculitis with renal involvement and hx of transient hemoptysis in the past with associated BOOP, 2008 (renal bx 2008: GLN with vasculitis). b. History of treatment with 2 cycles of Cytoxan and pheresis. H/o hemoptysis and pulm hemorrhage with 2nd cycle of cytoxan.    SURGICAL HISTORY: Past Surgical History:  Procedure Laterality Date  . BOWEL RESECTION N/A 07/12/2016   Procedure: SMALL BOWEL RESECTION;  Surgeon: Rolm Bookbinder, MD;  Location: [facility];  Service: General;  Laterality: N/A;  . CATARACT EXTRACTION Bilateral   . CORONARY ARTERY BYPASS GRAFT  2002  . CORONARY STENT INTERVENTION N/A 03/31/2017   Procedure: CORONARY STENT INTERVENTION;  Surgeon: Martinique, Peter M, MD;  Location: [facility];  Service: Cardiovascular;  Laterality: N/A;  . IR FLUORO GUIDE PORT INSERTION RIGHT  08/20/2016  . IR REMOVAL TUN ACCESS W/ PORT W/O FL MOD SED  01/20/2017  . IR US GUIDE VASC ACCESS RIGHT  08/20/2016  . LAPAROSCOPY N/A 07/12/2016   Procedure: LAPAROSCOPY DIAGNOSTIC;  Surgeon: Rolm Bookbinder, MD;  Location: [facility];  Service: General;  Laterality: N/A;  . LEFT HEART CATHETERIZATION WITH CORONARY/GRAFT ANGIOGRAM N/A 07/26/2012   Procedure: LEFT HEART CATHETERIZATION WITH Beatrix Fetters;  Surgeon: Wellington Hampshire, MD;  Location: [facility];  Service: Cardiovascular;  Laterality: N/A;  . LUNG BIOPSY    . PERCUTANEOUS CORONARY STENT INTERVENTION (PCI-S)  07/26/2012   Procedure: PERCUTANEOUS CORONARY STENT INTERVENTION (PCI-S);  Surgeon:  Wellington Hampshire, MD;  Location: [facility] C[facility];  Service: Cardiovascular;;  . RENAL BIOPSY    . RIGHT/LEFT HEART CATH AND CORONARY/GRAFT ANGIOGRAPHY N/A 03/23/2017   Procedure: RIGHT/LEFT HEART CATH AND CORONARY/GRAFT ANGIOGRAPHY;  Surgeon: Martinique, Peter M, MD;  Location: [facility];  Service: Cardiovascular;  Laterality: N/A;  . TOE AMPUTATION  2009   hammer toe    SOCIAL HISTORY: Social History   Socioeconomic History  . Marital status: Married    Spouse name: Not on file  . Number of children: Not on file  . Years of education: Not on file  . Highest education level: Not on file  Occupational History  . Occupation: [employer]  . Financial resource strain: Not on file  . Food insecurity    Worry: Not on file    Inability: Not on file  . Transportation needs    Medical: Not on file    Non-medical: Not on file  Tobacco Use  .  Smoking status: Never Smoker  . Smokeless tobacco: Never Used  Substance and Sexual Activity  . Alcohol use: No  . Drug use: No  . Sexual activity: Not on file  Lifestyle  . Physical activity    Days per week: Not on file    Minutes per session: Not on file  . Stress: Not on file  Relationships  . Social Herbalist on phone: Not on file    Gets together: Not on file    Attends religious service: Not on file    Active member of club or organization: Not on file    Attends meetings of clubs or organizations: Not on file    Relationship status: Not on file  . Intimate partner violence    Fear of current or ex partner: Not on file    Emotionally abused: Not on file    Physically abused: Not on file    Forced sexual activity: Not on file  Other Topics Concern  . Not on file  Social History Narrative   Regular exercise - yes      [facility] (07/12/16):   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  no pets currently. No bird or mold exposure.    FAMILY HISTORY: Family History  Problem  Relation Age of Onset  . Heart disease Mother   . Diabetes Mother   . Prostate cancer Father   . Depression Other   . Diabetes Other   . Prostate cancer Other   . Colon polyps Neg Hx   . Colon cancer Neg Hx   . Rectal cancer Neg Hx   . Stomach cancer Neg Hx   . Esophageal cancer Neg Hx     ALLERGIES:  has No Known Allergies.  MEDICATIONS:  Current Outpatient Medications  Medication Sig Dispense Refill  . acetaminophen (TYLENOL) 500 MG tablet Take 500 mg by mouth daily as needed for moderate pain or headache.     [name] aspirin EC 81 MG EC tablet Take 1 tablet (81 mg total) by mouth daily. 30 tablet   . azaTHIOprine (IMURAN) 50 MG tablet Take 2 tablets (100 mg total) by mouth daily. 90 tablet 1  . clopidogrel (PLAVIX) 75 MG tablet Take 1 tablet (75 mg total) by mouth daily. Please schedule appt for any future refills. 1st attempt. 90 tablet 0  . dorzolamide-timolol (COSOPT) 22.3-6.8 MG/ML ophthalmic solution Place 1 drop into both eyes 2 (two) times daily.     . finasteride (PROSCAR) 5 MG tablet Take 5 mg by mouth at bedtime.     . furosemide (LASIX) 20 MG tablet TAKE 1 TABLET BY MOUTH  DAILY 90 tablet 3  . levothyroxine (SYNTHROID, LEVOTHROID) 100 MCG tablet Take 100 mcg by mouth daily before breakfast.     . metoprolol succinate (TOPROL-XL) 25 MG 24 hr tablet TAKE 1 TABLET BY MOUTH  DAILY 90 tablet 0  . Multiple Vitamin (MULTIVITAMIN) tablet Take 1 tablet by mouth daily.      . nitroGLYCERIN (NITROSTAT) 0.4 MG SL tablet DISSOLVE 1 TABLET UNDER THE TONGUE EVERY 5 MINUTES AS  NEEDED FOR CHEST PAIN.  DO  NOT EXCEED A TOTAL OF 3  DOSES IN 15 MINUTES. 100 tablet 0  . pantoprazole (PROTONIX) 40 MG tablet Take 1 tablet (40 mg total) by mouth daily. Please keep upcoming appt in September for future refills. Thank you 90 tablet 1  . rosuvastatin (CRESTOR) 10 MG tablet Take 10 mg every morning  by mouth.    . Tamsulosin HCl (FLOMAX) 0.4 MG CAPS Take 0.4 mg by mouth at bedtime.      No current  facility-administered medications for this visit.    Facility-Administered Medications Ordered in Other Visits  Medication Dose Route Frequency Provider Last Rate Last Dose  . sodium chloride flush (NS) 0.9 % injection 10 mL  10 mL Intracatheter PRN Brunetta Genera, MD   10 mL at 08/24/16 1653  . sodium chloride flush (NS) 0.9 % injection 10 mL  10 mL Intracatheter PRN Brunetta Genera, MD   10 mL at 10/07/16 1555    REVIEW OF SYSTEMS:  A 10+ POINT REVIEW OF SYSTEMS WAS OBTAINED including neurology, dermatology, psychiatry, cardiac, respiratory, lymph, extremities, GI, GU, Musculoskeletal, constitutional, breasts, reproductive, HEENT.  All pertinent positives are noted in the HPI.  All others are negative.    PHYSICAL EXAMINATION: ECOG FS:2 - Symptomatic, <50% confined to bed  There were no vitals filed for this visit. Wt Readings from Last 3 Encounters:  01/17/19 236 lb 6.4 oz (107.2 kg)  11/14/18 232 lb 12.8 oz (105.6 kg)  07/14/18 228 lb 12.8 oz (103.8 kg)   There is no height or weight on file to calculate BMI.    GENERAL:alert, in no acute distress and comfortable SKIN: no acute rashes, no significant lesions EYES: conjunctiva are pink and non-injected, sclera anicteric OROPHARYNX: MMM, no exudates, no oropharyngeal erythema or ulceration NECK: supple, no JVD LYMPH:  no palpable lymphadenopathy in the cervical, axillary or inguinal regions LUNGS: clear to auscultation b/l with normal respiratory effort HEART: regular rate & rhythm ABDOMEN:  normoactive bowel sounds , non tender, not distended. Extremity: Trace pedal edema  PSYCH: alert & oriented x 3 with fluent speech NEURO: no focal motor/sensory deficits   LABORATORY DATA:  I have reviewed the data as listed . CBC Latest Ref Rng & Units 11/14/2018 07/14/2018 04/12/2018  WBC 4.0 - 10.5 K/uL 4.9 4.8 5.0  Hemoglobin 13.0 - 17.0 g/dL 12.7(L) 12.2(L) 11.8(L)  Hematocrit 39.0 - 52.0 % 38.7(L) 38.4(L) 37.5(L)   Platelets 150 - 400 K/uL 172 166 191   . CMP Latest Ref Rng & Units 11/14/2018 07/14/2018 04/12/2018  Glucose 70 - 99 mg/dL 135(H) 110(H) 111(H)  BUN 8 - 23 mg/dL 28(H) 27(H) 31(H)  Creatinine 0.61 - 1.24 mg/dL 2.09(H) 1.97(H) 2.10(H)  Sodium 135 - 145 mmol/L 139 141 143  Potassium 3.5 - 5.1 mmol/L 4.3 4.8 4.3  Chloride 98 - 111 mmol/L 106 108 106  CO2 22 - 32 mmol/L 25 26 26   Calcium 8.9 - 10.3 mg/dL 8.6(L) 8.8(L) 9.0  Total Protein 6.5 - 8.1 g/dL 6.9 7.1 7.0  Total Bilirubin 0.3 - 1.2 mg/dL 0.4 0.6 0.6  Alkaline Phos 38 - 126 U/L 60 61 59  AST 15 - 41 U/L 16 15 16   ALT 0 - 44 U/L 16 9 12     Lab Results  Component Value Date   LDH 180 11/14/2018     RADIOGRAPHIC STUDIES: I have personally reviewed the radiological images as listed and agreed with the findings in the report.  Nm Pet Image Restag (ps) Skull Base To Thigh  Result Date: 10/22/2016 CLINICAL DATA:  Subsequent treatment strategy for diffuse large B-cell non-Hodgkin's lymphoma. EXAM: NUCLEAR MEDICINE PET SKULL BASE TO THIGH TECHNIQUE: 11.15 mCi F-18 FDG was injected intravenously. Full-ring PET imaging was performed from the skull base to thigh after the radiotracer. CT data was obtained and used for attenuation correction  and anatomic localization. FASTING BLOOD GLUCOSE:  Value: 102 mg/dl COMPARISON:  None. FINDINGS: NECK No hypermetabolic lymph nodes in the neck. CHEST The FDG avid right axillary lymph nodes and epicardial lymph nodes have resolved in the interval. The focal hypermetabolic activity in the left ventricular myocardium described on the previous study is unchanged. No new abnormalities in the chest. ABDOMEN/PELVIS Significant improvement in the abdomen. No definitive FDG avid disease remains in the liver or spleen. The adenopathy in the abdomen has resolved. Minimal uptake in the normal appearing left adrenal gland is of no significance. No abnormal uptake seen in the soft tissues of the pelvis. The omental  nodularity seen previously has resolved. There is increased attenuation in the fat associated with the anterior ventral hernia with low level uptake, likely inflammatory. The maximum SUV is measured on image 161 is 3.7. SKELETON There has been significant overall improvement in the bones as well. Uptake in the anterior left acetabulum has significantly diminished with a maximum SUV of 3.4 today versus 29.4 previously. The left iliac uptake seen previously is no longer significantly FDG avid. There is mild focal uptake in the posterior left iliac bone on series 4, image 166 which was not seen previously and demonstrates a maximum SUV of 4.26 today. No other bony abnormalities are identified. IMPRESSION: 1. Significant interval improvement. No FDG avid disease remains in the soft tissues of the neck, chest, abdomen, or pelvis. Very mild uptake remains in the known left anterior acetabular lesion. Mild new uptake in the posterior left iliac bone is nonspecific but could represent mild involvement. Recommend attention on follow-up. 2. Increased attenuation and mild uptake in the fat of a lower midline ventral hernia is likely inflammatory. Recommend attention on follow-up. 3. No other interval change. Electronically Signed   By: Dorise Bullion III M.D   On: 10/22/2016 15:33   PET/CT 08/06/2016: IMPRESSION: 1. In addition to hypermetabolic nodal involvement of the right axilla, pericardial space, porta hepatis/mesenteric root, and retroperitoneum, there is extensive involvement of the liver as well as peritoneal involvement along the liver surface, splenic surface, and upper omentum. 2. Hypermetabolic bony lesions in the left pelvis compatible with malignant involvement. 3. There is a focus of hypermetabolic activity along the anterolateral wall of the left ventricle. Normally such findings turn out to simply be hypermetabolic myocardium. Strictly speaking given how focal this appears I cannot completely  exclude the possibility of tumor involvement within or along the myocardium.   Electronically Signed   By: Van Clines M.D.   On: 08/06/2016 12:48   PET/CT 12/29/2016  IMPRESSION: 1. No evidence of residual or recurrent hypermetabolic lymphoma. 2.  Aortic Atherosclerosis (ICD10-I70.0). 3. Dilatation of the infrarenal aorta and pelvic vasculature, as detailed above.   Electronically Signed   By: Abigail Miyamoto M.D.   On: 12/29/2016 15:17   ASSESSMENT & PLAN:   83 y.o.  male with multiple medical co-morbidities including cardiac co-morbidities, IPF, vascular on chronic immunosupression with    1) Stage IV Diffuse large B-cell lymphoma involving the small intestine (germinal cell phenotype) with complete thickness involvement of bowel and abutting serosa.- currently in remission.  Initial PET/CT scan showed extensive involvement with DLBCL, hypermetabolic nodal involvement of the right axilla, pericardial space, porta hepatis/mesenteric root, and retroperitoneum, there is extensive involvement of the liver as well as peritoneal involvement along the liver surface, splenic surface, and upper omentum. 2. Hypermetabolic bony lesions in the left pelvis compatible with malignant involvement. 3. There is a focus  of hypermetabolic activity along the anterolateral wall of the left ventricle.   s/p 6 cycles of R-CEOP .   PET/CT scan on 10/22/2016 after 3 cycles of chemotherapy shows excellent response to treatment. PET/CT scan on 12/29/2016 after 6 cycles of treatment shows No evidence of residual or recurrent hypermetabolic lymphoma  A)Sh/oneutropenic fever and E.coli/pseudomonal bacteremia/HCAP after the first cycle of treatment.  Significant chemotherapy related thrombocytopenia - now resolved. B) h/o hospitalization for perforation diverticulitis - no issues currently  2) Chronic kidney disease stage III baseline creatinine about 2 to 2.5. - stable today 3) Coronary disease  status post PCI and CABG - had a myocardial perfusion imaging scan on 06/30/2016 that showed a moderately decreased ejection fraction of 30-44%- following with cardiology for management. Was having some SOB/DOE and had cardiac cath with PCI to mid LAD on11/15/2018. 4) History of pulmonary and renal vasculitis back on chronic Imuran per Dr Meredeth Ide 5) Neutropenia -resolved  6) Thrombocytopenia due to chemotherapy -resolved  7) Anemia - much improved hgb is up to 12.9 8) Arthritis, especially in right knee -Week of 09/18/17 he had fluid drawn form right knee and given cortisone shot by PCP -Encouraged him to be active with walking or PT   PLAN:  A&P: -Discussed pt labwork today, 03/16/19; CBC w/diff and CMP is as follows: all values are WNL except for RBC at 3.7, Hemoglobin at 12.9, MCV at 105.7, Mch at 34.9, RDW at 15.7, Platelets at 144, Lymphs Abs at 0.3, Glucose Bld at 140, BUN at 32, Creatinine at 2.22, Calcium at 8.7, Albumin at 3.3, GFR Est Non Af Am at 27, GFR Est AFR Am at 31. -Discussed blood counts are normal -Discussed LDH is normal -Discussed blood chemistries are stable  -no clinical or lab evidence of lymphoma recurrence at this time. -continue f/u with PCP for mxof other medical issues. -patient is now out 2 yrs from treatmenf for his DLBCL -- will switch q77month f/u            FOLLOW UP: RTC with Dr Irene Limbo with labs in 6 months  The total time spent in the appt was 20 minutes and more than 50% was on counseling and direct patient cares.  All of the patient's questions were answered with apparent satisfaction. The patient knows to call the clinic with any problems, questions or concerns.    Sullivan Lone MD [place] AAHIVMS [facility] Hematology/Oncology Physician [facility]  (Office):       [phone] (Work cell):  [phone] (Fax):           [phone]  I, Scot Dock, am acting as a scribe for Dr. Sullivan Lone.   .I have reviewed the above documentation for  accuracy and completeness, and I agree with the above. Brunetta Genera MD

## 2019-03-19 ENCOUNTER — Telehealth: Payer: Self-pay | Admitting: Hematology

## 2019-03-19 NOTE — Telephone Encounter (Signed)
Scheduled appt per 10/30 los. ° °Left a VM of the appt date and time/ °

## 2019-03-22 ENCOUNTER — Other Ambulatory Visit: Payer: Self-pay | Admitting: Cardiovascular Disease

## 2019-03-30 DIAGNOSIS — R109 Unspecified abdominal pain: Secondary | ICD-10-CM | POA: Diagnosis not present

## 2019-03-30 DIAGNOSIS — R809 Proteinuria, unspecified: Secondary | ICD-10-CM | POA: Diagnosis not present

## 2019-03-30 DIAGNOSIS — I2510 Atherosclerotic heart disease of native coronary artery without angina pectoris: Secondary | ICD-10-CM | POA: Diagnosis not present

## 2019-03-30 DIAGNOSIS — E1122 Type 2 diabetes mellitus with diabetic chronic kidney disease: Secondary | ICD-10-CM | POA: Diagnosis not present

## 2019-03-30 DIAGNOSIS — N058 Unspecified nephritic syndrome with other morphologic changes: Secondary | ICD-10-CM | POA: Diagnosis not present

## 2019-03-30 DIAGNOSIS — N400 Enlarged prostate without lower urinary tract symptoms: Secondary | ICD-10-CM | POA: Diagnosis not present

## 2019-03-30 DIAGNOSIS — N184 Chronic kidney disease, stage 4 (severe): Secondary | ICD-10-CM | POA: Diagnosis not present

## 2019-03-30 DIAGNOSIS — Z23 Encounter for immunization: Secondary | ICD-10-CM | POA: Diagnosis not present

## 2019-03-30 DIAGNOSIS — E7800 Pure hypercholesterolemia, unspecified: Secondary | ICD-10-CM | POA: Diagnosis not present

## 2019-03-30 DIAGNOSIS — E1129 Type 2 diabetes mellitus with other diabetic kidney complication: Secondary | ICD-10-CM | POA: Diagnosis not present

## 2019-03-30 DIAGNOSIS — N057 Unspecified nephritic syndrome with diffuse crescentic glomerulonephritis: Secondary | ICD-10-CM | POA: Diagnosis not present

## 2019-03-30 DIAGNOSIS — N179 Acute kidney failure, unspecified: Secondary | ICD-10-CM | POA: Diagnosis not present

## 2019-03-30 DIAGNOSIS — I129 Hypertensive chronic kidney disease with stage 1 through stage 4 chronic kidney disease, or unspecified chronic kidney disease: Secondary | ICD-10-CM | POA: Diagnosis not present

## 2019-04-03 ENCOUNTER — Encounter: Payer: Self-pay | Admitting: Gastroenterology

## 2019-04-03 DIAGNOSIS — X32XXXD Exposure to sunlight, subsequent encounter: Secondary | ICD-10-CM | POA: Diagnosis not present

## 2019-04-03 DIAGNOSIS — L0100 Impetigo, unspecified: Secondary | ICD-10-CM | POA: Diagnosis not present

## 2019-04-03 DIAGNOSIS — L570 Actinic keratosis: Secondary | ICD-10-CM | POA: Diagnosis not present

## 2019-04-06 ENCOUNTER — Other Ambulatory Visit: Payer: Self-pay

## 2019-05-23 ENCOUNTER — Ambulatory Visit: Payer: Medicare Other | Admitting: Gastroenterology

## 2019-06-13 DIAGNOSIS — X32XXXD Exposure to sunlight, subsequent encounter: Secondary | ICD-10-CM | POA: Diagnosis not present

## 2019-06-13 DIAGNOSIS — L570 Actinic keratosis: Secondary | ICD-10-CM | POA: Diagnosis not present

## 2019-06-13 DIAGNOSIS — C44319 Basal cell carcinoma of skin of other parts of face: Secondary | ICD-10-CM | POA: Diagnosis not present

## 2019-06-21 ENCOUNTER — Other Ambulatory Visit: Payer: Self-pay | Admitting: Cardiovascular Disease

## 2019-06-25 DIAGNOSIS — N057 Unspecified nephritic syndrome with diffuse crescentic glomerulonephritis: Secondary | ICD-10-CM | POA: Diagnosis not present

## 2019-06-25 DIAGNOSIS — N400 Enlarged prostate without lower urinary tract symptoms: Secondary | ICD-10-CM | POA: Diagnosis not present

## 2019-06-25 DIAGNOSIS — G629 Polyneuropathy, unspecified: Secondary | ICD-10-CM | POA: Diagnosis not present

## 2019-06-25 DIAGNOSIS — N058 Unspecified nephritic syndrome with other morphologic changes: Secondary | ICD-10-CM | POA: Diagnosis not present

## 2019-06-25 DIAGNOSIS — I129 Hypertensive chronic kidney disease with stage 1 through stage 4 chronic kidney disease, or unspecified chronic kidney disease: Secondary | ICD-10-CM | POA: Diagnosis not present

## 2019-06-25 DIAGNOSIS — I2510 Atherosclerotic heart disease of native coronary artery without angina pectoris: Secondary | ICD-10-CM | POA: Diagnosis not present

## 2019-06-25 DIAGNOSIS — R809 Proteinuria, unspecified: Secondary | ICD-10-CM | POA: Diagnosis not present

## 2019-06-25 DIAGNOSIS — R109 Unspecified abdominal pain: Secondary | ICD-10-CM | POA: Diagnosis not present

## 2019-06-25 DIAGNOSIS — E7800 Pure hypercholesterolemia, unspecified: Secondary | ICD-10-CM | POA: Diagnosis not present

## 2019-06-25 DIAGNOSIS — N184 Chronic kidney disease, stage 4 (severe): Secondary | ICD-10-CM | POA: Diagnosis not present

## 2019-06-25 DIAGNOSIS — E1122 Type 2 diabetes mellitus with diabetic chronic kidney disease: Secondary | ICD-10-CM | POA: Diagnosis not present

## 2019-06-25 DIAGNOSIS — E1129 Type 2 diabetes mellitus with other diabetic kidney complication: Secondary | ICD-10-CM | POA: Diagnosis not present

## 2019-06-25 DIAGNOSIS — N179 Acute kidney failure, unspecified: Secondary | ICD-10-CM | POA: Diagnosis not present

## 2019-06-29 DIAGNOSIS — H44512 Absolute glaucoma, left eye: Secondary | ICD-10-CM | POA: Diagnosis not present

## 2019-06-29 DIAGNOSIS — H401211 Low-tension glaucoma, right eye, mild stage: Secondary | ICD-10-CM | POA: Diagnosis not present

## 2019-07-03 ENCOUNTER — Other Ambulatory Visit: Payer: Self-pay | Admitting: Cardiovascular Disease

## 2019-07-12 NOTE — Progress Notes (Signed)
Cardiology Office Note   Date:  07/17/2019   ID:  Luke Mcbride, DOB [birth year], MRN [id number]  PCP:  Donato Heinz, MD  Cardiologist:  Dr. [name]    No chief complaint on file.     History of Present Illness: Luke Mcbride is a 84 y.o. male who presents for f/u CAD/CABG 2002, DM, HTN, HLD and CKD  He was admitted 07/2012 with NSTEMI and underwent BMS to the SVR-PDA/PL. His LIMA to LAD was atretic but disease in the LAD was moderate. SVG-OM and SVG to diagonal were known to be occluded.  06/21/16 myovue scar no ischemia low risk  However he was admitted for angina and had cath 03/23/17 with new native mid LAD lesion that was stented due to atretic LIMA Echo with EF 35-40% September 2019 had mild CHF when diuretic held by nephrology. Has done well with 20 mg lasix daily and baseline Cr around 2.0 Sees [place]  Diagnosed with Stage IV Diffuse large B-cell lymphoma involving the small intestine.. He had 6 inchesof his colon resected. Following surgery he had 6 rounds of chemo. F/u PET scan and was told he was in remission.   Activity limited by right knee arthritis and neuropathy likely from chemo therapy   No angina compliant with meds    Some neuropathy in feet   He and wife got vaccine   Past Medical History:  Diagnosis Date  . ALLERGIC RHINITIS   . Anemia    hx  . Barrett's esophagus   . BOOP (bronchiolitis obliterans with organizing pneumonia) ([place])    a. s/p R VATS 2008.  [name] CAD (coronary artery disease)    a. 05/2000: NSTEMI/CABG x 6: LIMA->LAD, VG->D1, VG->OM1->2, VG->PDA->RPL;  b. 07/2007 MV: high lat infarct, no ischemia, EF 47%;  c. Cath/PCI: LM nl, LAD20p, 27m, D1 nl, D2 60-70ost, D3 nl, LCX 70ost, 191m, OM1/OM2 min irregs, RCA 30 diff, PDA 99, LIMA->LAD atretic, VG->D1 100, VG->OM1->2 100, VG->PDA->RPL 90p (4.0x23 Vision BMS);  c. 07/2012 Echo: EF 55%, gr1 DD.  [name] Cataract   . CKD (chronic kidney disease), stage III    a. renal bx 2008: GLN with vasculitis  .  Diffuse large B cell lymphoma ([place]) 08/07/2016  . GERD (gastroesophageal reflux disease)   . Glaucoma    a. Cannot see out of L eye.  . Hematuria    Microscopic  . Hyperglycemia    Patient reported while on prednisone, had to take insulin  . Hyperlipemia   . Hypertension   . Hypothyroidism   . ILD (interstitial lung disease) ([place])   . Iritis   . Local infection of skin and subcutaneous tissue   . Membranoproliferative nephritis   . Myocardial infarction ([place]) 2002  . Neutropenia, drug-induced ([place])   . Recurrent boils   . Residual foreign body in soft tissue   . Vasculitis ([place])    a.  pauciimmune vasculitis with renal involvement and hx of transient hemoptysis in the past with associated BOOP, 2008 (renal bx 2008: GLN with vasculitis). b. History of treatment with 2 cycles of Cytoxan and pheresis. H/o hemoptysis and pulm hemorrhage with 2nd cycle of cytoxan.    Past Surgical History:  Procedure Laterality Date  . BOWEL RESECTION N/A 07/12/2016   Procedure: SMALL BOWEL RESECTION;  Surgeon: Rolm Bookbinder, MD;  Location: [facility];  Service: General;  Laterality: N/A;  . CATARACT EXTRACTION Bilateral   . CORONARY ARTERY BYPASS GRAFT  2002  . CORONARY STENT INTERVENTION N/A 03/31/2017  Procedure: CORONARY STENT INTERVENTION;  Surgeon: Martinique, Derric Dealmeida M, MD;  Location: [facility];  Service: Cardiovascular;  Laterality: N/A;  . IR FLUORO GUIDE PORT INSERTION RIGHT  08/20/2016  . IR REMOVAL TUN ACCESS W/ PORT W/O FL MOD SED  01/20/2017  . IR US GUIDE VASC ACCESS RIGHT  08/20/2016  . LAPAROSCOPY N/A 07/12/2016   Procedure: LAPAROSCOPY DIAGNOSTIC;  Surgeon: Rolm Bookbinder, MD;  Location: [facility];  Service: General;  Laterality: N/A;  . LEFT HEART CATHETERIZATION WITH CORONARY/GRAFT ANGIOGRAM N/A 07/26/2012   Procedure: LEFT HEART CATHETERIZATION WITH Beatrix Fetters;  Surgeon: Wellington Hampshire, MD;  Location: [facility];  Service: Cardiovascular;  Laterality: N/A;  . LUNG BIOPSY     . PERCUTANEOUS CORONARY STENT INTERVENTION (PCI-S)  07/26/2012   Procedure: PERCUTANEOUS CORONARY STENT INTERVENTION (PCI-S);  Surgeon: Wellington Hampshire, MD;  Location: [facility];  Service: Cardiovascular;;  . RENAL BIOPSY    . RIGHT/LEFT HEART CATH AND CORONARY/GRAFT ANGIOGRAPHY N/A 03/23/2017   Procedure: RIGHT/LEFT HEART CATH AND CORONARY/GRAFT ANGIOGRAPHY;  Surgeon: Martinique, Will Heinkel M, MD;  Location: [facility];  Service: Cardiovascular;  Laterality: N/A;  . TOE AMPUTATION  2009   hammer toe     Current Outpatient Medications  Medication Sig Dispense Refill  . acetaminophen (TYLENOL) 500 MG tablet Take 500 mg by mouth daily as needed for moderate pain or headache.     [name] aspirin EC 81 MG EC tablet Take 1 tablet (81 mg total) by mouth daily. 30 tablet   . azaTHIOprine (IMURAN) 50 MG tablet Take 100 mg by mouth daily.    . clopidogrel (PLAVIX) 75 MG tablet TAKE 1 TABLET BY MOUTH  DAILY 90 tablet 1  . dorzolamide-timolol (COSOPT) 22.3-6.8 MG/ML ophthalmic solution Place 1 drop into both eyes 2 (two) times daily.     . finasteride (PROSCAR) 5 MG tablet Take 5 mg by mouth at bedtime.     . furosemide (LASIX) 20 MG tablet TAKE 1 TABLET BY MOUTH  DAILY 90 tablet 3  . levothyroxine (SYNTHROID, LEVOTHROID) 100 MCG tablet Take 100 mcg by mouth daily before breakfast.     . metoprolol succinate (TOPROL-XL) 25 MG 24 hr tablet TAKE 1 TABLET BY MOUTH  DAILY 90 tablet 2  . Multiple Vitamin (MULTIVITAMIN) tablet Take 1 tablet by mouth daily.      . nitroGLYCERIN (NITROSTAT) 0.4 MG SL tablet DISSOLVE 1 TABLET UNDER THE TONGUE EVERY 5 MINUTES AS  NEEDED FOR CHEST PAIN.  DO  NOT EXCEED A TOTAL OF 3  DOSES IN 15 MINUTES. 100 tablet 0  . pantoprazole (PROTONIX) 40 MG tablet TAKE 1 TABLET BY MOUTH  DAILY 90 tablet 0  . rosuvastatin (CRESTOR) 10 MG tablet Take 10 mg every morning by mouth.    . Tamsulosin HCl (FLOMAX) 0.4 MG CAPS Take 0.4 mg by mouth at bedtime.      No current facility-administered  medications for this visit.   Facility-Administered Medications Ordered in Other Visits  Medication Dose Route Frequency Provider Last Rate Last Admin  . sodium chloride flush (NS) 0.9 % injection 10 mL  10 mL Intracatheter PRN Brunetta Genera, MD   10 mL at 08/24/16 1653  . sodium chloride flush (NS) 0.9 % injection 10 mL  10 mL Intracatheter PRN Brunetta Genera, MD   10 mL at 10/07/16 1555    Allergies:   Patient has no known allergies.    Social History:  The patient  reports that  he has never smoked. He has never used smokeless tobacco. He reports that he does not drink alcohol or use drugs.   Family History:  The patient's family history includes Depression in an other family member; Diabetes in his mother and another family member; Heart disease in his mother; Prostate cancer in his father and another family member.    ROS:  General:no colds or fevers, no weight changes Skin:no rashes or ulcers HEENT:no blurred vision, no congestion CV:see HPI PUL:see HPI GI:no diarrhea constipation or melena, no indigestion GU:no hematuria, no dysuria MS:no joint pain, no claudication Neuro:no syncope, no lightheadedness Endo:no diabetes, + thyroid disease  Wt Readings from Last 3 Encounters:  07/17/19 246 lb (111.6 kg)  03/16/19 239 lb 6.4 oz (108.6 kg)  01/17/19 236 lb 6.4 oz (107.2 kg)     PHYSICAL EXAM: VS:  BP 130/88   Pulse 88   Ht 6' (1.829 m)   Wt 246 lb (111.6 kg)   SpO2 98%   BMI 33.36 kg/m  , BMI Body mass index is 33.36 kg/m. Affect appropriate Chronically ill white male  HEENT: normal Neck supple with no adenopathy JVP normal no bruits no thyromegaly Lungs clear with no wheezing and good diaphragmatic motion Heart:  S1/S2 no murmur, no rub, gallop or click PMI normal post sternotomy  Abdomen: benighn, BS positve, no tenderness, no AAA no bruit.  No HSM or HJR Distal pulses intact with no bruits Plus one bilateral  Edema a bit worse on right  Neuro  non-focal Skin warm and dry No muscular weakness    EKG: 01/17/19 SR rate 81 nonspecific ST changes QT 402    Recent Labs: 03/16/2019: ALT 16; BUN 32; Creatinine 2.22; Hemoglobin 12.9; Platelets 144; Potassium 4.0; Sodium 140    Lipid Panel    Component Value Date/Time   CHOL 157 02/11/2014 0733   TRIG 150.0 (H) 02/11/2014 0733   HDL 36.30 (L) 02/11/2014 0733   CHOLHDL 4 02/11/2014 0733   VLDL 30.0 02/11/2014 0733   LDLCALC 91 02/11/2014 0733   LDLDIRECT 221.7 05/23/2007 0000       Other studies Reviewed: Additional studies/ records that were reviewed today include:  Echo 01/30/18 EF 35-40%inferior MI trivial MR Moderate bi atrial enlargement Cath 03/23/17  Atretic LIMA to LAD Native LAD stented SVG D1 occluded small native vessel Patent SVG to PDA/PLB with stent Occluded SVG to OM   ASSESSMENT AND PLAN:   1. CAD/CABG:  Stable no angina DES to native LAD 04/03/17    2. Lymphoma:  Diffuse B cell intestinal post surgical resection and chemo Rx in remission Seen by oncology 04/12/18 improved counts no clinical or lab progression no active Rx at this time   3. Edema: from arthritis and previous vein stripping CABG duplex with no DVT   4. CRF:  Diuretics cut back by nephrology  Cr 2.1  11/14/18 f/u Dr Azzie Roup On Imuran for ? Component of vasculitis   5. Thyroid:  On synthroid replacement f/u primary TSH   6. CHF: Hospitalized 01/29/18 after renal stopped diuretics. TTE with stable EF 35-40% cleared quickly With iv lasix On PO lasix 20 mg unable to use ACE/ARB due to renal failure    F/u with me in 6 months   Jenkins Rouge

## 2019-07-17 ENCOUNTER — Other Ambulatory Visit: Payer: Self-pay

## 2019-07-17 ENCOUNTER — Encounter: Payer: Self-pay | Admitting: Cardiovascular Disease

## 2019-07-17 ENCOUNTER — Ambulatory Visit (INDEPENDENT_AMBULATORY_CARE_PROVIDER_SITE_OTHER): Payer: Medicare Other | Admitting: Cardiovascular Disease

## 2019-07-17 VITALS — BP 130/88 | HR 88 | Ht 72.0 in | Wt 246.0 lb

## 2019-07-17 DIAGNOSIS — Z951 Presence of aortocoronary bypass graft: Secondary | ICD-10-CM

## 2019-07-17 MED ORDER — NITROGLYCERIN 0.4 MG SL SUBL: 0.4000 mg | SUBLINGUAL_TABLET | SUBLINGUAL | 3 refills | Status: DC | PRN

## 2019-07-17 NOTE — Patient Instructions (Signed)
Medication Instructions:  *If you need a refill on your cardiac medications before your next appointment, please call your pharmacy*  Lab Work: If you have labs (blood work) drawn today and your tests are completely normal, you will receive your results only by: . MyChart Message (if you have MyChart) OR . A paper copy in the mail If you have any lab test that is abnormal or we need to change your treatment, we will call you to review the results.  Testing/Procedures: None ordered at this time.   Follow-Up: At [facility], you and your health needs are our priority.  As part of our continuing mission to provide you with exceptional heart care, we have created designated Provider Care Teams.  These Care Teams include your primary Cardiologist (physician) and Advanced Practice Providers (APPs -  Physician Assistants and Nurse Practitioners) who all work together to provide you with the care you need, when you need it.  We recommend signing up for the patient portal called "MyChart".  Sign up information is provided on this After Visit Summary.  MyChart is used to connect with patients for Virtual Visits (Telemedicine).  Patients are able to view lab/test results, encounter notes, upcoming appointments, etc.  Non-urgent messages can be sent to your provider as well.   To learn more about what you can do with MyChart, go to [email or web address].    Your next appointment:   6 month(s)  The format for your next appointment:   In Person  Provider:   You may see Peter Nishan, MD or one of the following Advanced Practice Providers on your designated Care Team:    Lori Gerhardt, NP  Laura Ingold, NP  Jill McDaniel, NP     

## 2019-08-22 DIAGNOSIS — D045 Carcinoma in situ of skin of trunk: Secondary | ICD-10-CM | POA: Diagnosis not present

## 2019-08-22 DIAGNOSIS — C44329 Squamous cell carcinoma of skin of other parts of face: Secondary | ICD-10-CM | POA: Diagnosis not present

## 2019-09-04 DIAGNOSIS — L570 Actinic keratosis: Secondary | ICD-10-CM | POA: Diagnosis not present

## 2019-09-04 DIAGNOSIS — X32XXXD Exposure to sunlight, subsequent encounter: Secondary | ICD-10-CM | POA: Diagnosis not present

## 2019-09-04 DIAGNOSIS — C44329 Squamous cell carcinoma of skin of other parts of face: Secondary | ICD-10-CM | POA: Diagnosis not present

## 2019-09-04 DIAGNOSIS — D0421 Carcinoma in situ of skin of right ear and external auricular canal: Secondary | ICD-10-CM | POA: Diagnosis not present

## 2019-09-12 ENCOUNTER — Other Ambulatory Visit: Payer: Self-pay | Admitting: *Deleted

## 2019-09-12 DIAGNOSIS — C8330 Diffuse large B-cell lymphoma, unspecified site: Secondary | ICD-10-CM

## 2019-09-12 NOTE — Progress Notes (Signed)
HEMATOLOGY/ONCOLOGY CLINIC NOTE  Date of Service: 09/13/19  Patient Care Team: Donato Heinz, MD as PCP - General (Nephrology) [name], MD as PCP - Cardiology (Cardiology) Brunetta Genera, MD as Consulting Physician (Hematology)  CHIEF COMPLAINTS/PURPOSE OF CONSULTATION:  F/u for Diffuse large B cell lymphoma of the small intestine  HISTORY OF PRESENTING ILLNESS:   Luke Mcbride is a wonderful 84 y.o. male who has been referred to [place] by Dr .Donato Heinz, MD and Mauricio Po MD for evaluation and management of newly diagnosed diffuse large B-cell lymphoma of the small bowel.  Patient has a history of multiple medical comorbidities as noted below including coronary artery disease status post PCI and CABG, chronic kidney disease stage III, Barrett's esophagus, hypertension, dyslipidemia, interstitial lung disease, GERD, vasculitis involving the lungs and kidneys on chronic Imuran therapy.  He was having upper abdominal discomfort for a few months with loss of about 15-20 pounds. He had a CT scan of the abdomen done by Dr. Havery Moros on 06/15/2016 which showed a 3.3 x 3.2 x 2.4 cm indeterminate lesion involving the left upper quadrant small bowel loops. No additional small bowel lesions noted. No ascites. No retroperitoneal mesenteric or pelvic lymphadenopathy noted.  Patient was seen by Dr. Serita Grammes and underwent a diagnostic laparoscopy with small bowel resection on 07/12/2016. Pathology showed that the lesion was consistent with diffuse large B-cell lymphoma 2 cm in length and 4 cm in width centrally ulcerated mass involving 100% of the bowel circumference. The tumor was about 1 cm thick invading through the entire wall and into the underlying mesenteric fat. The tumor also abuts and focally involve the serosa. No extramural satellite lymph nodes noted. Did not seem to involve the peri-intestinal lymph nodes. Tumor was noted to have a germinal center  phenotype.  Patient is healing well from surgery. Continues to be on Imuran 100 mg by mouth daily. Has not reported any other peripheral enlarged lymph nodes. He is starting to eat better.   INTERVAL HISTORY   YUG LORIA is here for follow-up for his Diffuse large B cell lymphoma of the small intestine. The patient's last visit with [place] was on 03/16/2019. The pt reports that he is doing well overall.  The pt reports he is good. He has been seeing dermatologist and was referred to skin surgery center for Moh's surgery. Pt has squamous cell carcinoma's. He has been put back on 100mg  of Imuran. Pt has gotten both doses of COVID19 vaccine.   Lab results today (09/13/19) of CBC w/diff and CMP is as follows: all values are WNL except for RBC at 3.38, Hemoglobin at 12.7, MCV at 102.9, RDW at 15.8, Platelets at 147K, Lymph Abs at 0.4K, Glucose at 122, BUN at 45, Creatinine at 2.41, Albumin at 3.2, GFR, Est Non Af Am at 24, GFR, Est AFR Am at 28  09/13/19 of LDH at 178: WNL  On review of systems, pt  denies fever, chills, night sweats, infection issues, abdominal pain and any other symptoms.   Sees Dr. Nevada Crane for his dermatology f/u.  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 ALLERGIC RHINITIS   . Anemia    hx  . Barrett's esophagus   . BOOP (bronchiolitis obliterans with organizing pneumonia) ([place])    a. s/p R VATS 2008.  [name] CAD (coronary artery disease)    a. 05/2000: NSTEMI/CABG x 6: LIMA->LAD, VG->D1, VG->OM1->2, VG->PDA->RPL;  b. 07/2007 MV: high lat infarct, no ischemia, EF 47%;  c. Cath/PCI: LM nl, LAD20p, 26m, D1 nl, D2 60-70ost, D3 nl, LCX 70ost, 160m, OM1/OM2 min irregs, RCA 30 diff, PDA 99, LIMA->LAD atretic, VG->D1 100, VG->OM1->2 100, VG->PDA->RPL 90p (4.0x23 Vision BMS);  c. 07/2012 Echo: EF 55%, gr1 DD.  [name] Cataract   . CKD (chronic kidney disease), stage III    a. renal bx 2008: GLN with vasculitis  . Diffuse large B cell lymphoma  ([place]) 08/07/2016  . GERD (gastroesophageal reflux disease)   . Glaucoma    a. Cannot see out of L eye.  . Hematuria    Microscopic  . Hyperglycemia    Patient reported while on prednisone, had to take insulin  . Hyperlipemia   . Hypertension   . Hypothyroidism   . ILD (interstitial lung disease) ([place])   . Iritis   . Local infection of skin and subcutaneous tissue   . Membranoproliferative nephritis   . Myocardial infarction ([place]) 2002  . Neutropenia, drug-induced ([place])   . Recurrent boils   . Residual foreign body in soft tissue   . Vasculitis ([place])    a.  pauciimmune vasculitis with renal involvement and hx of transient hemoptysis in the past with associated BOOP, 2008 (renal bx 2008: GLN with vasculitis). b. History of treatment with 2 cycles of Cytoxan and pheresis. H/o hemoptysis and pulm hemorrhage with 2nd cycle of cytoxan.    SURGICAL HISTORY: Past Surgical History:  Procedure Laterality Date  . BOWEL RESECTION N/A 07/12/2016   Procedure: SMALL BOWEL RESECTION;  Surgeon: Rolm Bookbinder, MD;  Location: [facility];  Service: General;  Laterality: N/A;  . CATARACT EXTRACTION Bilateral   . CORONARY ARTERY BYPASS GRAFT  2002  . CORONARY STENT INTERVENTION N/A 03/31/2017   Procedure: CORONARY STENT INTERVENTION;  Surgeon: Martinique, Peter M, MD;  Location: [facility] CV LAB;  Service: Cardiovascular;  Laterality: N/A;  . IR FLUORO GUIDE PORT INSERTION RIGHT  08/20/2016  . IR REMOVAL TUN ACCESS W/ PORT W/O FL MOD SED  01/20/2017  . IR US GUIDE VASC ACCESS RIGHT  08/20/2016  . LAPAROSCOPY N/A 07/12/2016   Procedure: LAPAROSCOPY DIAGNOSTIC;  Surgeon: Rolm Bookbinder, MD;  Location: [facility];  Service: General;  Laterality: N/A;  . LEFT HEART CATHETERIZATION WITH CORONARY/GRAFT ANGIOGRAM N/A 07/26/2012   Procedure: LEFT HEART CATHETERIZATION WITH Beatrix Fetters;  Surgeon: Wellington Hampshire, MD;  Location: [facility] CATH LAB;  Service: Cardiovascular;  Laterality: N/A;  . LUNG BIOPSY    .  PERCUTANEOUS CORONARY STENT INTERVENTION (PCI-S)  07/26/2012   Procedure: PERCUTANEOUS CORONARY STENT INTERVENTION (PCI-S);  Surgeon: Wellington Hampshire, MD;  Location: [facility] CATH LAB;  Service: Cardiovascular;;  . RENAL BIOPSY    . RIGHT/LEFT HEART CATH AND CORONARY/GRAFT ANGIOGRAPHY N/A 03/23/2017   Procedure: RIGHT/LEFT HEART CATH AND CORONARY/GRAFT ANGIOGRAPHY;  Surgeon: Martinique, Peter M, MD;  Location: [facility] CV LAB;  Service: Cardiovascular;  Laterality: N/A;  . TOE AMPUTATION  2009   hammer toe    SOCIAL HISTORY: Social History   Socioeconomic History  . Marital status: Married    Spouse name: Not on file  . Number of children: Not on file  . Years of education: Not on file  . Highest education level: Not on file  Occupational History  . Occupation: [employer]  . Smoking status: Never Smoker  . Smokeless tobacco: Never Used  Substance and Sexual Activity  . Alcohol use: No  . Drug use:  No  . Sexual activity: Not on file  Other Topics Concern  . Not on file  Social History Narrative   Regular exercise - yes      [place] [facility] (07/12/16):   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  no pets currently. No bird or mold exposure.   Social Determinants of Health   Financial Resource Strain:   . Difficulty of Paying Living Expenses:   Food Insecurity:   . Worried About Charity fundraiser in the Last Year:   . Arboriculturist in the Last Year:   Transportation Needs:   . Film/video editor (Medical):   [name] Lack of Transportation (Non-Medical):   Physical Activity:   . Days of Exercise per Week:   . Minutes of Exercise per Session:   Stress:   . Feeling of Stress :   Social Connections:   . Frequency of Communication with Friends and Family:   . Frequency of Social Gatherings with Friends and Family:   . Attends Religious Services:   . Active Member of Clubs or Organizations:   . Attends Archivist Meetings:   [name]  Marital Status:   Intimate Partner Violence:   . Fear of Current or Ex-Partner:   . Emotionally Abused:   [name] Physically Abused:   . Sexually Abused:     FAMILY HISTORY: Family History  Problem Relation Age of Onset  . Heart disease Mother   . Diabetes Mother   . Prostate cancer Father   . Depression Other   . Diabetes Other   . Prostate cancer Other   . Colon polyps Neg Hx   . Colon cancer Neg Hx   . Rectal cancer Neg Hx   . Stomach cancer Neg Hx   . Esophageal cancer Neg Hx     ALLERGIES:  has No Known Allergies.  MEDICATIONS:  Current Outpatient Medications  Medication Sig Dispense Refill  . acetaminophen (TYLENOL) 500 MG tablet Take 500 mg by mouth daily as needed for moderate pain or headache.     [name] aspirin EC 81 MG EC tablet Take 1 tablet (81 mg total) by mouth daily. 30 tablet   . azaTHIOprine (IMURAN) 50 MG tablet Take 100 mg by mouth daily.    . clopidogrel (PLAVIX) 75 MG tablet TAKE 1 TABLET BY MOUTH  DAILY 90 tablet 1  . dorzolamide-timolol (COSOPT) 22.3-6.8 MG/ML ophthalmic solution Place 1 drop into both eyes 2 (two) times daily.     . finasteride (PROSCAR) 5 MG tablet Take 5 mg by mouth at bedtime.     . furosemide (LASIX) 20 MG tablet TAKE 1 TABLET BY MOUTH  DAILY 90 tablet 3  . levothyroxine (SYNTHROID, LEVOTHROID) 100 MCG tablet Take 100 mcg by mouth daily before breakfast.     . metoprolol succinate (TOPROL-XL) 25 MG 24 hr tablet TAKE 1 TABLET BY MOUTH  DAILY 90 tablet 2  . Multiple Vitamin (MULTIVITAMIN) tablet Take 1 tablet by mouth daily.      . nitroGLYCERIN (NITROSTAT) 0.4 MG SL tablet Place 1 tablet (0.4 mg total) under the tongue every 5 (five) minutes as needed for chest pain. 25 tablet 3  . pantoprazole (PROTONIX) 40 MG tablet TAKE 1 TABLET BY MOUTH  DAILY 90 tablet 0  . rosuvastatin (CRESTOR) 10 MG tablet Take 10 mg every morning by mouth.    . Tamsulosin HCl (FLOMAX) 0.4 MG CAPS Take 0.4 mg by mouth at bedtime.  No current  facility-administered medications for this visit.   Facility-Administered Medications Ordered in Other Visits  Medication Dose Route Frequency Provider Last Rate Last Admin  . sodium chloride flush (NS) 0.9 % injection 10 mL  10 mL Intracatheter PRN Brunetta Genera, MD   10 mL at 08/24/16 1653  . sodium chloride flush (NS) 0.9 % injection 10 mL  10 mL Intracatheter PRN Brunetta Genera, MD   10 mL at 10/07/16 1555    REVIEW OF SYSTEMS:  A 10+ POINT REVIEW OF SYSTEMS WAS OBTAINED including neurology, dermatology, psychiatry, cardiac, respiratory, lymph, extremities, GI, GU, Musculoskeletal, constitutional, breasts, reproductive, HEENT.  All pertinent positives are noted in the HPI.  All others are negative.   PHYSICAL EXAMINATION: ECOG FS:2 - Symptomatic, <50% confined to bed  Vitals:   09/13/19 1018 09/13/19 1019  BP: (!) 153/86 (!) 150/96  Pulse: 90   Resp: 18   Temp: 98.7 F (37.1 C)   SpO2: 96%    Wt Readings from Last 3 Encounters:  09/13/19 244 lb 6.4 oz (110.9 kg)  07/17/19 246 lb (111.6 kg)  03/16/19 239 lb 6.4 oz (108.6 kg)   Body mass index is 33.15 kg/m.    GENERAL:alert, in no acute distress and comfortable SKIN: no acute rashes, no significant lesions EYES: conjunctiva are pink and non-injected, sclera anicteric OROPHARYNX: MMM, no exudates, no oropharyngeal erythema or ulceration NECK: supple, no JVD LYMPH:  no palpable lymphadenopathy in the cervical, axillary or inguinal regions LUNGS: clear to auscultation b/l with normal respiratory effort HEART: regular rate & rhythm ABDOMEN:  normoactive bowel sounds , non tender, not distended. Extremity: 1-2+ pedal edema PSYCH: alert & oriented x 3 with fluent speech NEURO: no focal motor/sensory deficits  LABORATORY DATA:  I have reviewed the data as listed . CBC Latest Ref Rng & Units 09/13/2019 03/16/2019 11/14/2018  WBC 4.0 - 10.5 K/uL 5.7 5.3 4.9  Hemoglobin 13.0 - 17.0 g/dL 12.7(L) 12.9(L) 12.7(L)   Hematocrit 39.0 - 52.0 % 39.4 39.1 38.7(L)  Platelets 150 - 400 K/uL 147(L) 144(L) 172   . CMP Latest Ref Rng & Units 09/13/2019 03/16/2019 11/14/2018  Glucose 70 - 99 mg/dL 122(H) 140(H) 135(H)  BUN 8 - 23 mg/dL 45(H) 32(H) 28(H)  Creatinine 0.61 - 1.24 mg/dL 2.41(H) 2.22(H) 2.09(H)  Sodium 135 - 145 mmol/L 144 140 139  Potassium 3.5 - 5.1 mmol/L 4.1 4.0 4.3  Chloride 98 - 111 mmol/L 109 107 106  CO2 22 - 32 mmol/L 24 23 25   Calcium 8.9 - 10.3 mg/dL 8.9 8.7(L) 8.6(L)  Total Protein 6.5 - 8.1 g/dL 6.8 6.9 6.9  Total Bilirubin 0.3 - 1.2 mg/dL 0.5 0.5 0.4  Alkaline Phos 38 - 126 U/L 63 64 60  AST 15 - 41 U/L 15 15 16   ALT 0 - 44 U/L 13 16 16     Lab Results  Component Value Date   LDH 178 09/13/2019     RADIOGRAPHIC STUDIES: I have personally reviewed the radiological images as listed and agreed with the findings in the report.  Nm Pet Image Restag (ps) Skull Base To Thigh  Result Date: 10/22/2016 CLINICAL DATA:  Subsequent treatment strategy for diffuse large B-cell non-Hodgkin's lymphoma. EXAM: NUCLEAR MEDICINE PET SKULL BASE TO THIGH TECHNIQUE: 11.15 mCi F-18 FDG was injected intravenously. Full-ring PET imaging was performed from the skull base to thigh after the radiotracer. CT data was obtained and used for attenuation correction and anatomic localization. FASTING BLOOD GLUCOSE:  Value: 102  mg/dl COMPARISON:  None. FINDINGS: NECK No hypermetabolic lymph nodes in the neck. CHEST The FDG avid right axillary lymph nodes and epicardial lymph nodes have resolved in the interval. The focal hypermetabolic activity in the left ventricular myocardium described on the previous study is unchanged. No new abnormalities in the chest. ABDOMEN/PELVIS Significant improvement in the abdomen. No definitive FDG avid disease remains in the liver or spleen. The adenopathy in the abdomen has resolved. Minimal uptake in the normal appearing left adrenal gland is of no significance. No abnormal uptake seen  in the soft tissues of the pelvis. The omental nodularity seen previously has resolved. There is increased attenuation in the fat associated with the anterior ventral hernia with low level uptake, likely inflammatory. The maximum SUV is measured on image 161 is 3.7. SKELETON There has been significant overall improvement in the bones as well. Uptake in the anterior left acetabulum has significantly diminished with a maximum SUV of 3.4 today versus 29.4 previously. The left iliac uptake seen previously is no longer significantly FDG avid. There is mild focal uptake in the posterior left iliac bone on series 4, image 166 which was not seen previously and demonstrates a maximum SUV of 4.26 today. No other bony abnormalities are identified. IMPRESSION: 1. Significant interval improvement. No FDG avid disease remains in the soft tissues of the neck, chest, abdomen, or pelvis. Very mild uptake remains in the known left anterior acetabular lesion. Mild new uptake in the posterior left iliac bone is nonspecific but could represent mild involvement. Recommend attention on follow-up. 2. Increased attenuation and mild uptake in the fat of a lower midline ventral hernia is likely inflammatory. Recommend attention on follow-up. 3. No other interval change. Electronically Signed   By: Dorise Bullion III M.D   On: 10/22/2016 15:33   PET/CT 08/06/2016: IMPRESSION: 1. In addition to hypermetabolic nodal involvement of the right axilla, pericardial space, porta hepatis/mesenteric root, and retroperitoneum, there is extensive involvement of the liver as well as peritoneal involvement along the liver surface, splenic surface, and upper omentum. 2. Hypermetabolic bony lesions in the left pelvis compatible with malignant involvement. 3. There is a focus of hypermetabolic activity along the anterolateral wall of the left ventricle. Normally such findings turn out to simply be hypermetabolic myocardium. Strictly speaking  given how focal this appears I cannot completely exclude the possibility of tumor involvement within or along the myocardium.   Electronically Signed   By: Van Clines M.D.   On: 08/06/2016 12:48   PET/CT 12/29/2016  IMPRESSION: 1. No evidence of residual or recurrent hypermetabolic lymphoma. 2.  Aortic Atherosclerosis (ICD10-I70.0). 3. Dilatation of the infrarenal aorta and pelvic vasculature, as detailed above.   Electronically Signed   By: Abigail Miyamoto M.D.   On: 12/29/2016 15:17   ASSESSMENT & PLAN:   84 y.o.  male with multiple medical co-morbidities including cardiac co-morbidities, IPF, vascular on chronic immunosupression with    1) Stage IV Diffuse large B-cell lymphoma involving the small intestine (germinal cell phenotype) with complete thickness involvement of bowel and abutting serosa.- currently in remission.  Initial PET/CT scan showed extensive involvement with DLBCL, hypermetabolic nodal involvement of the right axilla, pericardial space, porta hepatis/mesenteric root, and retroperitoneum, there is extensive involvement of the liver as well as peritoneal involvement along the liver surface, splenic surface, and upper omentum. 2. Hypermetabolic bony lesions in the left pelvis compatible with malignant involvement. 3. There is a focus of hypermetabolic activity along the anterolateral wall of the  left ventricle.   s/p 6 cycles of R-CEOP .   PET/CT scan on 10/22/2016 after 3 cycles of chemotherapy shows excellent response to treatment. PET/CT scan on 12/29/2016 after 6 cycles of treatment shows No evidence of residual or recurrent hypermetabolic lymphoma  A)Sh/oneutropenic fever and E.coli/pseudomonal bacteremia/HCAP after the first cycle of treatment.  Significant chemotherapy related thrombocytopenia - now resolved. B) h/o hospitalization for perforation diverticulitis - no issues currently  2) Chronic kidney disease stage III baseline creatinine about  2 to 2.5. - stable today 3) Coronary disease status post PCI and CABG - had a myocardial perfusion imaging scan on 06/30/2016 that showed a moderately decreased ejection fraction of 30-44%- following with cardiology for management. Was having some SOB/DOE and had cardiac cath with PCI to mid LAD on11/15/2018. 4) History of pulmonary and renal vasculitis back on chronic Imuran per Dr Meredeth Ide 5) Neutropenia -resolved  6) Thrombocytopenia due to chemotherapy -resolved  7) Anemia - much improved hgb is up to 12.9 8) Arthritis, especially in right knee -Week of 09/18/17 he had fluid drawn form right knee and given cortisone shot by PCP -Encouraged him to be active with walking or PT   PLAN:  -Discussed pt labwork today, 09/13/19; of CBC w/diff and CMP is as follows: all values are WNL except for RBC at 3.38, Hemoglobin at 12.7, MCV at 102.9, RDW at 15.8, Platelets at 147K, Lymph Abs at 0.4K, Glucose at 122, BUN at 45, Creatinine at 2.41, Albumin at 3.2, GFR, Est Non Af Am at 24, GFR, Est AFR Am at 28  -Discussed 09/13/19 of LDH at 178: WNL -no clinical or lab evidence of lymphoma recurrence at this time. -continue f/u with PCP for mx of other medical issues. -patient is now out 2 yrs from treatmenf for his DLBCL  -Advised imuran is recurrent risk factor for skin cancers  -Advised Squamous cell Carcinomas do not affect lymphoma -probably mostly sun exposure  -Continue f/u with Dermatologist -Will see back in 6 months            FOLLOW UP: RTC with Dr Irene Limbo with labs in 6 months  The total time spent in the appt was 20 minutes and more than 50% was on counseling and direct patient cares.  All of the patient's questions were answered with apparent satisfaction. The patient knows to call the clinic with any problems, questions or concerns.  Sullivan Lone MD [place] [facility]AHIVMS [facility] Hematology/Oncology Physician [facility]  (Office):       [phone] (Work cell):  [phone] (Fax):            [phone]  I, Dawayne Cirri am acting as a scribe for Dr. Sullivan Lone.   .I have reviewed the above documentation for accuracy and completeness, and I agree with the above. Brunetta Genera MD

## 2019-09-13 ENCOUNTER — Inpatient Hospital Stay: Payer: Medicare Other | Attending: Hematology

## 2019-09-13 ENCOUNTER — Telehealth: Payer: Self-pay | Admitting: Hematology

## 2019-09-13 ENCOUNTER — Other Ambulatory Visit: Payer: Self-pay

## 2019-09-13 ENCOUNTER — Inpatient Hospital Stay (HOSPITAL_BASED_OUTPATIENT_CLINIC_OR_DEPARTMENT_OTHER): Payer: Medicare Other | Admitting: Hematology

## 2019-09-13 VITALS — BP 150/96 | HR 90 | Temp 98.7°F | Resp 18 | Ht 72.0 in | Wt 244.4 lb

## 2019-09-13 DIAGNOSIS — K219 Gastro-esophageal reflux disease without esophagitis: Secondary | ICD-10-CM | POA: Insufficient documentation

## 2019-09-13 DIAGNOSIS — Z8719 Personal history of other diseases of the digestive system: Secondary | ICD-10-CM | POA: Insufficient documentation

## 2019-09-13 DIAGNOSIS — C8338 Diffuse large B-cell lymphoma, lymph nodes of multiple sites: Secondary | ICD-10-CM

## 2019-09-13 DIAGNOSIS — I129 Hypertensive chronic kidney disease with stage 1 through stage 4 chronic kidney disease, or unspecified chronic kidney disease: Secondary | ICD-10-CM | POA: Insufficient documentation

## 2019-09-13 DIAGNOSIS — E785 Hyperlipidemia, unspecified: Secondary | ICD-10-CM | POA: Diagnosis not present

## 2019-09-13 DIAGNOSIS — Z818 Family history of other mental and behavioral disorders: Secondary | ICD-10-CM | POA: Diagnosis not present

## 2019-09-13 DIAGNOSIS — D84821 Immunodeficiency due to drugs: Secondary | ICD-10-CM | POA: Diagnosis not present

## 2019-09-13 DIAGNOSIS — I252 Old myocardial infarction: Secondary | ICD-10-CM | POA: Diagnosis not present

## 2019-09-13 DIAGNOSIS — N183 Chronic kidney disease, stage 3 unspecified: Secondary | ICD-10-CM | POA: Insufficient documentation

## 2019-09-13 DIAGNOSIS — Z8249 Family history of ischemic heart disease and other diseases of the circulatory system: Secondary | ICD-10-CM | POA: Insufficient documentation

## 2019-09-13 DIAGNOSIS — M1711 Unilateral primary osteoarthritis, right knee: Secondary | ICD-10-CM | POA: Insufficient documentation

## 2019-09-13 DIAGNOSIS — Z833 Family history of diabetes mellitus: Secondary | ICD-10-CM | POA: Insufficient documentation

## 2019-09-13 DIAGNOSIS — K439 Ventral hernia without obstruction or gangrene: Secondary | ICD-10-CM | POA: Diagnosis not present

## 2019-09-13 DIAGNOSIS — I2510 Atherosclerotic heart disease of native coronary artery without angina pectoris: Secondary | ICD-10-CM | POA: Insufficient documentation

## 2019-09-13 DIAGNOSIS — C8330 Diffuse large B-cell lymphoma, unspecified site: Secondary | ICD-10-CM | POA: Insufficient documentation

## 2019-09-13 DIAGNOSIS — R509 Fever, unspecified: Secondary | ICD-10-CM | POA: Insufficient documentation

## 2019-09-13 DIAGNOSIS — Z8042 Family history of malignant neoplasm of prostate: Secondary | ICD-10-CM | POA: Insufficient documentation

## 2019-09-13 DIAGNOSIS — I700 Atherosclerosis of aorta: Secondary | ICD-10-CM | POA: Diagnosis not present

## 2019-09-13 DIAGNOSIS — Z9049 Acquired absence of other specified parts of digestive tract: Secondary | ICD-10-CM | POA: Insufficient documentation

## 2019-09-13 DIAGNOSIS — R7881 Bacteremia: Secondary | ICD-10-CM | POA: Diagnosis not present

## 2019-09-13 DIAGNOSIS — Z79899 Other long term (current) drug therapy: Secondary | ICD-10-CM | POA: Diagnosis not present

## 2019-09-13 LAB — CBC WITH DIFFERENTIAL (CANCER CENTER ONLY)
Abs Immature Granulocytes: 0.02 10*3/uL (ref 0.00–0.07)
Basophils Absolute: 0 10*3/uL (ref 0.0–0.1)
Basophils Relative: 1 %
Eosinophils Absolute: 0.1 10*3/uL (ref 0.0–0.5)
Eosinophils Relative: 2 %
HCT: 39.4 % (ref 39.0–52.0)
Hemoglobin: 12.7 g/dL — ABNORMAL LOW (ref 13.0–17.0)
Immature Granulocytes: 0 %
Lymphocytes Relative: 7 %
Lymphs Abs: 0.4 10*3/uL — ABNORMAL LOW (ref 0.7–4.0)
MCH: 33.2 pg (ref 26.0–34.0)
MCHC: 32.2 g/dL (ref 30.0–36.0)
MCV: 102.9 fL — ABNORMAL HIGH (ref 80.0–100.0)
Monocytes Absolute: 0.6 10*3/uL (ref 0.1–1.0)
Monocytes Relative: 11 %
Neutro Abs: 4.5 10*3/uL (ref 1.7–7.7)
Neutrophils Relative %: 79 %
Platelet Count: 147 10*3/uL — ABNORMAL LOW (ref 150–400)
RBC: 3.83 MIL/uL — ABNORMAL LOW (ref 4.22–5.81)
RDW: 15.8 % — ABNORMAL HIGH (ref 11.5–15.5)
WBC Count: 5.7 10*3/uL (ref 4.0–10.5)
nRBC: 0 % (ref 0.0–0.2)

## 2019-09-13 LAB — CMP (CANCER CENTER ONLY)
ALT: 13 U/L (ref 0–44)
AST: 15 U/L (ref 15–41)
Albumin: 3.2 g/dL — ABNORMAL LOW (ref 3.5–5.0)
Alkaline Phosphatase: 63 U/L (ref 38–126)
Anion gap: 11 (ref 5–15)
BUN: 45 mg/dL — ABNORMAL HIGH (ref 8–23)
CO2: 24 mmol/L (ref 22–32)
Calcium: 8.9 mg/dL (ref 8.9–10.3)
Chloride: 109 mmol/L (ref 98–111)
Creatinine: 2.41 mg/dL — ABNORMAL HIGH (ref 0.61–1.24)
GFR, Est AFR Am: 28 mL/min — ABNORMAL LOW (ref >60)
GFR, Estimated: 24 mL/min — ABNORMAL LOW (ref >60)
Glucose, Bld: 122 mg/dL — ABNORMAL HIGH (ref 70–99)
Potassium: 4.1 mmol/L (ref 3.5–5.1)
Sodium: 144 mmol/L (ref 135–145)
Total Bilirubin: 0.5 mg/dL (ref 0.3–1.2)
Total Protein: 6.8 g/dL (ref 6.5–8.1)

## 2019-09-13 LAB — LACTATE DEHYDROGENASE: LDH: 178 U/L (ref 98–192)

## 2019-09-13 NOTE — Telephone Encounter (Signed)
Scheduled appt per 4/29 los. ° °Printed calendar and avs. °

## 2019-09-20 DIAGNOSIS — C44329 Squamous cell carcinoma of skin of other parts of face: Secondary | ICD-10-CM | POA: Diagnosis not present

## 2019-10-02 DIAGNOSIS — L570 Actinic keratosis: Secondary | ICD-10-CM | POA: Diagnosis not present

## 2019-10-02 DIAGNOSIS — G629 Polyneuropathy, unspecified: Secondary | ICD-10-CM | POA: Diagnosis not present

## 2019-10-02 DIAGNOSIS — Z08 Encounter for follow-up examination after completed treatment for malignant neoplasm: Secondary | ICD-10-CM | POA: Diagnosis not present

## 2019-10-02 DIAGNOSIS — N179 Acute kidney failure, unspecified: Secondary | ICD-10-CM | POA: Diagnosis not present

## 2019-10-02 DIAGNOSIS — N057 Unspecified nephritic syndrome with diffuse crescentic glomerulonephritis: Secondary | ICD-10-CM | POA: Diagnosis not present

## 2019-10-02 DIAGNOSIS — E669 Obesity, unspecified: Secondary | ICD-10-CM | POA: Diagnosis not present

## 2019-10-02 DIAGNOSIS — I129 Hypertensive chronic kidney disease with stage 1 through stage 4 chronic kidney disease, or unspecified chronic kidney disease: Secondary | ICD-10-CM | POA: Diagnosis not present

## 2019-10-02 DIAGNOSIS — X32XXXD Exposure to sunlight, subsequent encounter: Secondary | ICD-10-CM | POA: Diagnosis not present

## 2019-10-02 DIAGNOSIS — N400 Enlarged prostate without lower urinary tract symptoms: Secondary | ICD-10-CM | POA: Diagnosis not present

## 2019-10-02 DIAGNOSIS — C44329 Squamous cell carcinoma of skin of other parts of face: Secondary | ICD-10-CM | POA: Diagnosis not present

## 2019-10-02 DIAGNOSIS — R809 Proteinuria, unspecified: Secondary | ICD-10-CM | POA: Diagnosis not present

## 2019-10-02 DIAGNOSIS — I2510 Atherosclerotic heart disease of native coronary artery without angina pectoris: Secondary | ICD-10-CM | POA: Diagnosis not present

## 2019-10-02 DIAGNOSIS — N184 Chronic kidney disease, stage 4 (severe): Secondary | ICD-10-CM | POA: Diagnosis not present

## 2019-10-02 DIAGNOSIS — L820 Inflamed seborrheic keratosis: Secondary | ICD-10-CM | POA: Diagnosis not present

## 2019-10-02 DIAGNOSIS — Z85828 Personal history of other malignant neoplasm of skin: Secondary | ICD-10-CM | POA: Diagnosis not present

## 2019-10-02 DIAGNOSIS — E1122 Type 2 diabetes mellitus with diabetic chronic kidney disease: Secondary | ICD-10-CM | POA: Diagnosis not present

## 2019-10-02 DIAGNOSIS — N058 Unspecified nephritic syndrome with other morphologic changes: Secondary | ICD-10-CM | POA: Diagnosis not present

## 2019-10-02 DIAGNOSIS — E7800 Pure hypercholesterolemia, unspecified: Secondary | ICD-10-CM | POA: Diagnosis not present

## 2019-10-23 ENCOUNTER — Other Ambulatory Visit: Payer: Self-pay | Admitting: Cardiovascular Disease

## 2019-11-04 ENCOUNTER — Other Ambulatory Visit: Payer: Self-pay | Admitting: Cardiovascular Disease

## 2019-11-05 DIAGNOSIS — N184 Chronic kidney disease, stage 4 (severe): Secondary | ICD-10-CM | POA: Diagnosis not present

## 2019-11-06 DIAGNOSIS — C44329 Squamous cell carcinoma of skin of other parts of face: Secondary | ICD-10-CM | POA: Diagnosis not present

## 2019-12-18 DIAGNOSIS — X32XXXD Exposure to sunlight, subsequent encounter: Secondary | ICD-10-CM | POA: Diagnosis not present

## 2019-12-18 DIAGNOSIS — L570 Actinic keratosis: Secondary | ICD-10-CM | POA: Diagnosis not present

## 2019-12-18 DIAGNOSIS — D0462 Carcinoma in situ of skin of left upper limb, including shoulder: Secondary | ICD-10-CM | POA: Diagnosis not present

## 2019-12-18 DIAGNOSIS — Z08 Encounter for follow-up examination after completed treatment for malignant neoplasm: Secondary | ICD-10-CM | POA: Diagnosis not present

## 2019-12-18 DIAGNOSIS — C44622 Squamous cell carcinoma of skin of right upper limb, including shoulder: Secondary | ICD-10-CM | POA: Diagnosis not present

## 2019-12-18 DIAGNOSIS — Z85828 Personal history of other malignant neoplasm of skin: Secondary | ICD-10-CM | POA: Diagnosis not present

## 2019-12-24 DIAGNOSIS — N184 Chronic kidney disease, stage 4 (severe): Secondary | ICD-10-CM | POA: Diagnosis not present

## 2019-12-25 DIAGNOSIS — N184 Chronic kidney disease, stage 4 (severe): Secondary | ICD-10-CM | POA: Diagnosis not present

## 2020-01-02 DIAGNOSIS — G629 Polyneuropathy, unspecified: Secondary | ICD-10-CM | POA: Diagnosis not present

## 2020-01-02 DIAGNOSIS — I129 Hypertensive chronic kidney disease with stage 1 through stage 4 chronic kidney disease, or unspecified chronic kidney disease: Secondary | ICD-10-CM | POA: Diagnosis not present

## 2020-01-02 DIAGNOSIS — I2510 Atherosclerotic heart disease of native coronary artery without angina pectoris: Secondary | ICD-10-CM | POA: Diagnosis not present

## 2020-01-02 DIAGNOSIS — E1122 Type 2 diabetes mellitus with diabetic chronic kidney disease: Secondary | ICD-10-CM | POA: Diagnosis not present

## 2020-01-02 DIAGNOSIS — N058 Unspecified nephritic syndrome with other morphologic changes: Secondary | ICD-10-CM | POA: Diagnosis not present

## 2020-01-02 DIAGNOSIS — E7800 Pure hypercholesterolemia, unspecified: Secondary | ICD-10-CM | POA: Diagnosis not present

## 2020-01-02 DIAGNOSIS — N057 Unspecified nephritic syndrome with diffuse crescentic glomerulonephritis: Secondary | ICD-10-CM | POA: Diagnosis not present

## 2020-01-02 DIAGNOSIS — N400 Enlarged prostate without lower urinary tract symptoms: Secondary | ICD-10-CM | POA: Diagnosis not present

## 2020-01-02 DIAGNOSIS — N184 Chronic kidney disease, stage 4 (severe): Secondary | ICD-10-CM | POA: Diagnosis not present

## 2020-01-02 DIAGNOSIS — R809 Proteinuria, unspecified: Secondary | ICD-10-CM | POA: Diagnosis not present

## 2020-01-02 DIAGNOSIS — N179 Acute kidney failure, unspecified: Secondary | ICD-10-CM | POA: Diagnosis not present

## 2020-01-02 DIAGNOSIS — E669 Obesity, unspecified: Secondary | ICD-10-CM | POA: Diagnosis not present

## 2020-01-06 DIAGNOSIS — Z23 Encounter for immunization: Secondary | ICD-10-CM | POA: Diagnosis not present

## 2020-01-18 DIAGNOSIS — D0439 Carcinoma in situ of skin of other parts of face: Secondary | ICD-10-CM | POA: Diagnosis not present

## 2020-01-18 DIAGNOSIS — Z85828 Personal history of other malignant neoplasm of skin: Secondary | ICD-10-CM | POA: Diagnosis not present

## 2020-01-18 DIAGNOSIS — Z08 Encounter for follow-up examination after completed treatment for malignant neoplasm: Secondary | ICD-10-CM | POA: Diagnosis not present

## 2020-01-18 DIAGNOSIS — X32XXXD Exposure to sunlight, subsequent encounter: Secondary | ICD-10-CM | POA: Diagnosis not present

## 2020-01-18 DIAGNOSIS — B078 Other viral warts: Secondary | ICD-10-CM | POA: Diagnosis not present

## 2020-01-18 DIAGNOSIS — L570 Actinic keratosis: Secondary | ICD-10-CM | POA: Diagnosis not present

## 2020-01-29 NOTE — Progress Notes (Signed)
Cardiology Office Note   Date:  02/12/2020   ID:  DREDYN GUBBELS, DOB [birth year], MRN [id number]  PCP:  Donato Heinz, MD  Cardiologist:  Dr. [name]    No chief complaint on file.     History of Present Illness: Luke Mcbride is a 84 y.o. male who presents for f/u CAD/CABG 2002, DM, HTN, HLD and CKD  He was admitted 07/2012 with NSTEMI and underwent BMS to the SVR-PDA/PL. His LIMA to LAD was atretic but disease in the LAD was moderate. SVG-OM and SVG to diagonal were known to be occluded.  06/21/16 myovue scar no ischemia low risk  However he was admitted for angina and had cath 03/23/17 with new native mid LAD lesion that was stented due to atretic LIMA Echo with EF 35-40% September 2019 had mild CHF when diuretic held by nephrology. Has done well with 20 mg lasix daily and baseline Cr around 2.4 Sees [place]  Diagnosed with Stage IV Diffuse large B-cell lymphoma involving the small intestine.. He had 6 inchesof his colon resected. Following surgery he had 6 rounds of chemo. F/u PET scan and was told he was in remission.   Activity limited by right knee arthritis and neuropathy likely from chemo therapy   No angina compliant with meds    Some neuropathy in feet   He and wife got vaccine   He has had some increased dyspnea Weight ok Compliant with meds Still with some edema  Past Medical History:  Diagnosis Date  . ALLERGIC RHINITIS   . Anemia    hx  . Barrett's esophagus   . BOOP (bronchiolitis obliterans with organizing pneumonia) ([place])    a. s/p R VATS 2008.  [name] CAD (coronary artery disease)    a. 05/2000: NSTEMI/CABG x 6: LIMA->LAD, VG->D1, VG->OM1->2, VG->PDA->RPL;  b. 07/2007 MV: high lat infarct, no ischemia, EF 47%;  c. Cath/PCI: LM nl, LAD20p, 64m, D1 nl, D2 60-70ost, D3 nl, LCX 70ost, 135m, OM1/OM2 min irregs, RCA 30 diff, PDA 99, LIMA->LAD atretic, VG->D1 100, VG->OM1->2 100, VG->PDA->RPL 90p (4.0x23 Vision BMS);  c. 07/2012 Echo: EF 55%, gr1 DD.  [name] Cataract    . CKD (chronic kidney disease), stage III    a. renal bx 2008: GLN with vasculitis  . Diffuse large B cell lymphoma ([place]) 08/07/2016  . GERD (gastroesophageal reflux disease)   . Glaucoma    a. Cannot see out of L eye.  . Hematuria    Microscopic  . Hyperglycemia    Patient reported while on prednisone, had to take insulin  . Hyperlipemia   . Hypertension   . Hypothyroidism   . ILD (interstitial lung disease) ([place])   . Iritis   . Local infection of skin and subcutaneous tissue   . Membranoproliferative nephritis   . Myocardial infarction ([place]) 2002  . Neutropenia, drug-induced ([place])   . Recurrent boils   . Residual foreign body in soft tissue   . Vasculitis ([place])    a.  pauciimmune vasculitis with renal involvement and hx of transient hemoptysis in the past with associated BOOP, 2008 (renal bx 2008: GLN with vasculitis). b. History of treatment with 2 cycles of Cytoxan and pheresis. H/o hemoptysis and pulm hemorrhage with 2nd cycle of cytoxan.    Past Surgical History:  Procedure Laterality Date  . BOWEL RESECTION N/A 07/12/2016   Procedure: SMALL BOWEL RESECTION;  Surgeon: Rolm Bookbinder, MD;  Location: [facility];  Service: General;  Laterality: N/A;  . CATARACT EXTRACTION Bilateral   .  CORONARY ARTERY BYPASS GRAFT  2002  . CORONARY STENT INTERVENTION N/A 03/31/2017   Procedure: CORONARY STENT INTERVENTION;  Surgeon: Martinique, Lorianne Malbrough M, MD;  Location: [facility];  Service: Cardiovascular;  Laterality: N/A;  . IR FLUORO GUIDE PORT INSERTION RIGHT  08/20/2016  . IR REMOVAL TUN ACCESS W/ PORT W/O FL MOD SED  01/20/2017  . IR US GUIDE VASC ACCESS RIGHT  08/20/2016  . LAPAROSCOPY N/A 07/12/2016   Procedure: LAPAROSCOPY DIAGNOSTIC;  Surgeon: Rolm Bookbinder, MD;  Location: [facility];  Service: General;  Laterality: N/A;  . LEFT HEART CATHETERIZATION WITH CORONARY/GRAFT ANGIOGRAM N/A 07/26/2012   Procedure: LEFT HEART CATHETERIZATION WITH Beatrix Fetters;  Surgeon: Wellington Hampshire, MD;  Location: [facility];  Service: Cardiovascular;  Laterality: N/A;  . LUNG BIOPSY    . PERCUTANEOUS CORONARY STENT INTERVENTION (PCI-S)  07/26/2012   Procedure: PERCUTANEOUS CORONARY STENT INTERVENTION (PCI-S);  Surgeon: Wellington Hampshire, MD;  Location: [facility] C[facility];  Service: Cardiovascular;;  . RENAL BIOPSY    . RIGHT/LEFT HEART CATH AND CORONARY/GRAFT ANGIOGRAPHY N/A 03/23/2017   Procedure: RIGHT/LEFT HEART CATH AND CORONARY/GRAFT ANGIOGRAPHY;  Surgeon: Martinique, Amarie Viles M, MD;  Location: [facility];  Service: Cardiovascular;  Laterality: N/A;  . TOE AMPUTATION  2009   hammer toe     Current Outpatient Medications  Medication Sig Dispense Refill  . acetaminophen (TYLENOL) 500 MG tablet Take 500 mg by mouth daily as needed for moderate pain or headache.     [name] aspirin EC 81 MG EC tablet Take 1 tablet (81 mg total) by mouth daily. 30 tablet   . azaTHIOprine (IMURAN) 50 MG tablet Take 100 mg by mouth daily.    . clopidogrel (PLAVIX) 75 MG tablet TAKE 1 TABLET BY MOUTH  DAILY 90 tablet 2  . dorzolamide-timolol (COSOPT) 22.3-6.8 MG/ML ophthalmic solution Place 1 drop into both eyes 2 (two) times daily.     . finasteride (PROSCAR) 5 MG tablet Take 5 mg by mouth at bedtime.     . furosemide (LASIX) 20 MG tablet TAKE 1 TABLET BY MOUTH  DAILY 90 tablet 3  . levothyroxine (SYNTHROID, LEVOTHROID) 100 MCG tablet Take 100 mcg by mouth daily before breakfast.     . metoprolol succinate (TOPROL-XL) 25 MG 24 hr tablet TAKE 1 TABLET BY MOUTH  DAILY 90 tablet 2  . Multiple Vitamin (MULTIVITAMIN) tablet Take 1 tablet by mouth daily.      . nitroGLYCERIN (NITROSTAT) 0.4 MG SL tablet Place 1 tablet (0.4 mg total) under the tongue every 5 (five) minutes as needed for chest pain. 25 tablet 3  . pantoprazole (PROTONIX) 40 MG tablet TAKE 1 TABLET BY MOUTH  DAILY 90 tablet 2  . rosuvastatin (CRESTOR) 10 MG tablet Take 10 mg every morning by mouth.    . Tamsulosin HCl (FLOMAX) 0.4 MG CAPS Take 0.4 mg  by mouth at bedtime.      No current facility-administered medications for this visit.   Facility-Administered Medications Ordered in Other Visits  Medication Dose Route Frequency Provider Last Rate Last Admin  . sodium chloride flush (NS) 0.9 % injection 10 mL  10 mL Intracatheter PRN Brunetta Genera, MD   10 mL at 08/24/16 1653  . sodium chloride flush (NS) 0.9 % injection 10 mL  10 mL Intracatheter PRN Brunetta Genera, MD   10 mL at 10/07/16 1555    Allergies:   Patient has no known allergies.    Social History:  The  patient  reports that he has never smoked. He has never used smokeless tobacco. He reports that he does not drink alcohol and does not use drugs.   Family History:  The patient's family history includes Depression in an other family member; Diabetes in his mother and another family member; Heart disease in his mother; Prostate cancer in his father and another family member.    ROS:  General:no colds or fevers, no weight changes Skin:no rashes or ulcers HEENT:no blurred vision, no congestion CV:see HPI PUL:see HPI GI:no diarrhea constipation or melena, no indigestion GU:no hematuria, no dysuria MS:no joint pain, no claudication Neuro:no syncope, no lightheadedness Endo:no diabetes, + thyroid disease  Wt Readings from Last 3 Encounters:  02/12/20 236 lb 6.4 oz (107.2 kg)  09/13/19 244 lb 6.4 oz (110.9 kg)  07/17/19 246 lb (111.6 kg)     PHYSICAL EXAM: VS:  BP (!) 144/84   Pulse 85   Ht 6' (1.829 m)   Wt 236 lb 6.4 oz (107.2 kg)   SpO2 95%   BMI 32.06 kg/m  , BMI Body mass index is 32.06 kg/m.   Affect appropriate Chronically ill white male  HEENT: normal Neck supple with no adenopathy JVP normal no bruits no thyromegaly Lungs clear with no wheezing and good diaphragmatic motion Heart:  S1/S2 no murmur, no rub, gallop or click PMI normal post sternotomy  Abdomen: benighn, BS positve, no tenderness, no AAA no bruit.  No HSM or HJR post  surgery for lymphoma  Distal pulses intact with no bruits Plus one bilateral  Edema a bit worse on right  Neuro non-focal Skin warm and dry No muscular weakness   EKG: 01/17/19 SR rate 81 nonspecific ST changes QT 402 02/10/20 SR rate 85 nonspecific ST changes    Recent Labs: 09/13/2019: ALT 13; BUN 45; Creatinine 2.41; Hemoglobin 12.7; Platelet Count 147; Potassium 4.1; Sodium 144    Lipid Panel    Component Value Date/Time   CHOL 157 02/11/2014 0733   TRIG 150.0 (H) 02/11/2014 0733   HDL 36.30 (L) 02/11/2014 0733   CHOLHDL 4 02/11/2014 0733   VLDL 30.0 02/11/2014 0733   LDLCALC 91 02/11/2014 0733   LDLDIRECT 221.7 05/23/2007 0000       Other studies Reviewed: Additional studies/ records that were reviewed today include:  Echo 01/30/18 EF 35-40%inferior MI trivial MR Moderate bi atrial enlargement Cath 03/23/17  Atretic LIMA to LAD Native LAD stented SVG D1 occluded small native vessel Patent SVG to PDA/PLB with stent Occluded SVG to OM   ASSESSMENT AND PLAN:   1. CAD/CABG:  Stable no angina DES to native LAD 04/03/17    2. Lymphoma:  Diffuse B cell intestinal post surgical resection and chemo Rx in remission Seen by oncology 09/13/19 stable on Imuran   3. Edema: from arthritis and previous vein stripping CABG duplex with no DVT   4. CRF:  Diuretics cut back by nephrology  Cr 2.4  09/13/19  f/u Dr Azzie Roup    5. Thyroid:  On synthroid replacement f/u primary TSH   6. CHF: Hospitalized 01/29/18 after renal stopped diuretics. TTE with stable EF 35-40% cleared quickly With iv lasix On PO lasix 20 mg unable to use ACE/ARB due to renal failure  Will update echo for EF ischemic DCM Discussed taking added lasix if needed. BMET/BNP today Consider adding bidil if EF still moderately reduced   F/u with me in 6 months  Echo ordered   [employer]

## 2020-02-12 ENCOUNTER — Ambulatory Visit (INDEPENDENT_AMBULATORY_CARE_PROVIDER_SITE_OTHER): Payer: Medicare Other | Admitting: Cardiovascular Disease

## 2020-02-12 ENCOUNTER — Encounter: Payer: Self-pay | Admitting: Cardiovascular Disease

## 2020-02-12 ENCOUNTER — Other Ambulatory Visit: Payer: Self-pay

## 2020-02-12 VITALS — BP 144/84 | HR 85 | Ht 72.0 in | Wt 236.4 lb

## 2020-02-12 DIAGNOSIS — I10 Essential (primary) hypertension: Secondary | ICD-10-CM | POA: Diagnosis not present

## 2020-02-12 DIAGNOSIS — I25118 Atherosclerotic heart disease of native coronary artery with other forms of angina pectoris: Secondary | ICD-10-CM

## 2020-02-12 DIAGNOSIS — I5043 Acute on chronic combined systolic (congestive) and diastolic (congestive) heart failure: Secondary | ICD-10-CM

## 2020-02-12 NOTE — Patient Instructions (Signed)
Medication Instructions:  *If you need a refill on your cardiac medications before your next appointment, please call your pharmacy*  Lab Work: Your physician recommends that you have lab work today- BMET and BNP  If you have labs (blood work) drawn today and your tests are completely normal, you will receive your results only by: . MyChart Message (if you have MyChart) OR . A paper copy in the mail If you have any lab test that is abnormal or we need to change your treatment, we will call you to review the results.   Testing/Procedures: Your physician has requested that you have an echocardiogram. Echocardiography is a painless test that uses sound waves to create images of your heart. It provides your doctor with information about the size and shape of your heart and how well your heart's chambers and valves are working. This procedure takes approximately one hour. There are no restrictions for this procedure.  Follow-Up: At [facility], you and your health needs are our priority.  As part of our continuing mission to provide you with exceptional heart care, we have created designated Provider Care Teams.  These Care Teams include your primary Cardiologist (physician) and Advanced Practice Providers (APPs -  Physician Assistants and Nurse Practitioners) who all work together to provide you with the care you need, when you need it.  We recommend signing up for the patient portal called "MyChart".  Sign up information is provided on this After Visit Summary.  MyChart is used to connect with patients for Virtual Visits (Telemedicine).  Patients are able to view lab/test results, encounter notes, upcoming appointments, etc.  Non-urgent messages can be sent to your provider as well.   To learn more about what you can do with MyChart, go to [email or web address].    Your next appointment:   6 month(s)  The format for your next appointment:   In Person  Provider:   You may see Peter  Nishan, MD or one of the following Advanced Practice Providers on your designated Care Team:    Lori Gerhardt, NP  Laura Ingold, NP  Jill McDaniel, NP 

## 2020-02-13 ENCOUNTER — Telehealth: Payer: Self-pay

## 2020-02-13 DIAGNOSIS — I5043 Acute on chronic combined systolic (congestive) and diastolic (congestive) heart failure: Secondary | ICD-10-CM

## 2020-02-13 DIAGNOSIS — R0600 Dyspnea, unspecified: Secondary | ICD-10-CM

## 2020-02-13 LAB — BASIC METABOLIC PANEL
BUN/Creatinine Ratio: 15 (ref 10–24)
BUN: 35 mg/dL — ABNORMAL HIGH (ref 8–27)
CO2: 21 mmol/L (ref 20–29)
Calcium: 8.8 mg/dL (ref 8.6–10.2)
Chloride: 104 mmol/L (ref 96–106)
Creatinine, Ser: 2.27 mg/dL — ABNORMAL HIGH (ref 0.76–1.27)
GFR calc Af Amer: 30 mL/min/{1.73_m2} — ABNORMAL LOW (ref >59)
GFR calc non Af Amer: 26 mL/min/{1.73_m2} — ABNORMAL LOW (ref >59)
Glucose: 131 mg/dL — ABNORMAL HIGH (ref 65–99)
Potassium: 4.4 mmol/L (ref 3.5–5.2)
Sodium: 139 mmol/L (ref 134–144)

## 2020-02-13 LAB — PRO B NATRIURETIC PEPTIDE: NT-Pro BNP: 1293 pg/mL — ABNORMAL HIGH (ref 0–486)

## 2020-02-13 MED ORDER — FUROSEMIDE 20 MG PO TABS: 20.0000 mg | ORAL_TABLET | Freq: Two times a day (BID) | ORAL | 3 refills | Status: DC

## 2020-02-13 NOTE — Telephone Encounter (Signed)
The patient has been notified of the result and verbalized understanding.  All questions (if any) were answered. Michaelyn Barter, RN 02/13/2020 12:33 PM   Patient will come in on November 10th  for repeat lab work. Will send new prescription to patient's pharmacy of choice.

## 2020-02-13 NOTE — Telephone Encounter (Signed)
-----   Message from [name], MD sent at 02/13/2020  9:48 AM EDT ----- BNP elevated increase lasix to 20 mg bid f/u labs in 6 weeks

## 2020-02-29 DIAGNOSIS — Z08 Encounter for follow-up examination after completed treatment for malignant neoplasm: Secondary | ICD-10-CM | POA: Diagnosis not present

## 2020-02-29 DIAGNOSIS — Z85828 Personal history of other malignant neoplasm of skin: Secondary | ICD-10-CM | POA: Diagnosis not present

## 2020-02-29 DIAGNOSIS — L570 Actinic keratosis: Secondary | ICD-10-CM | POA: Diagnosis not present

## 2020-02-29 DIAGNOSIS — X32XXXD Exposure to sunlight, subsequent encounter: Secondary | ICD-10-CM | POA: Diagnosis not present

## 2020-02-29 DIAGNOSIS — D0461 Carcinoma in situ of skin of right upper limb, including shoulder: Secondary | ICD-10-CM | POA: Diagnosis not present

## 2020-03-04 ENCOUNTER — Ambulatory Visit (HOSPITAL_COMMUNITY): Payer: Medicare Other | Attending: Cardiovascular Disease

## 2020-03-04 ENCOUNTER — Other Ambulatory Visit: Payer: Self-pay

## 2020-03-04 DIAGNOSIS — I5043 Acute on chronic combined systolic (congestive) and diastolic (congestive) heart failure: Secondary | ICD-10-CM | POA: Diagnosis not present

## 2020-03-04 DIAGNOSIS — I10 Essential (primary) hypertension: Secondary | ICD-10-CM | POA: Insufficient documentation

## 2020-03-04 DIAGNOSIS — I25118 Atherosclerotic heart disease of native coronary artery with other forms of angina pectoris: Secondary | ICD-10-CM | POA: Insufficient documentation

## 2020-03-04 LAB — ECHOCARDIOGRAM COMPLETE
Area-P 1/2: 3.17 cm2
MV M vel: 5.53 m/s
MV Peak grad: 122.3 mmHg
Radius: 0.5 cm
S' Lateral: 3.5 cm

## 2020-03-12 NOTE — Progress Notes (Signed)
HEMATOLOGY/ONCOLOGY CLINIC NOTE  Date of Service: 03/12/20  Patient Care Team: Donato Heinz, MD as PCP - General (Nephrology) [name], MD as PCP - Cardiology (Cardiology) Brunetta Genera, MD as Consulting Physician (Hematology)  CHIEF COMPLAINTS/PURPOSE OF CONSULTATION:  F/u for Diffuse large B cell lymphoma of the small intestine  HISTORY OF PRESENTING ILLNESS:   Luke Mcbride is a wonderful 84 y.o. male who has been referred to [place] by Dr .Donato Heinz, MD and Mauricio Po MD for evaluation and management of newly diagnosed diffuse large B-cell lymphoma of the small bowel.  Patient has a history of multiple medical comorbidities as noted below including coronary artery disease status post PCI and CABG, chronic kidney disease stage III, Barrett's esophagus, hypertension, dyslipidemia, interstitial lung disease, GERD, vasculitis involving the lungs and kidneys on chronic Imuran therapy.  He was having upper abdominal discomfort for a few months with loss of about 15-20 pounds. He had a CT scan of the abdomen done by Dr. Havery Moros on 06/15/2016 which showed a 3.3 x 3.2 x 2.4 cm indeterminate lesion involving the left upper quadrant small bowel loops. No additional small bowel lesions noted. No ascites. No retroperitoneal mesenteric or pelvic lymphadenopathy noted.  Patient was seen by Dr. Serita Grammes and underwent a diagnostic laparoscopy with small bowel resection on 07/12/2016. Pathology showed that the lesion was consistent with diffuse large B-cell lymphoma 2 cm in length and 4 cm in width centrally ulcerated mass involving 100% of the bowel circumference. The tumor was about 1 cm thick invading through the entire wall and into the underlying mesenteric fat. The tumor also abuts and focally involve the serosa. No extramural satellite lymph nodes noted. Did not seem to involve the peri-intestinal lymph nodes. Tumor was noted to have a germinal center  phenotype.  Patient is healing well from surgery. Continues to be on Imuran 100 mg by mouth daily. Has not reported any other peripheral enlarged lymph nodes. He is starting to eat better.   INTERVAL HISTORY:  Luke Mcbride is here for follow-up for his Diffuse large B cell lymphoma of the small intestine. The patient's last visit with [place] was on 09/13/2019. The pt reports that he is doing well overall.  The pt reports forehead SCC for which he had Mohs surgery 2 months ago- wound still healing.  Labs today reviewed with patient  On review of systems, pt reports no fevers/chills/night sweats or new unexpected weight loss.    MEDICAL HISTORY:  Past Medical History:  Diagnosis Date  . ALLERGIC RHINITIS   . Anemia    hx  . Barrett's esophagus   . BOOP (bronchiolitis obliterans with organizing pneumonia) ([place])    a. s/p R VATS 2008.  [name] CAD (coronary artery disease)    a. 05/2000: NSTEMI/CABG x 6: LIMA->LAD, VG->D1, VG->OM1->2, VG->PDA->RPL;  b. 07/2007 MV: high lat infarct, no ischemia, EF 47%;  c. Cath/PCI: LM nl, LAD20p, 82m, D1 nl, D2 60-70ost, D3 nl, LCX 70ost, 121m, OM1/OM2 min irregs, RCA 30 diff, PDA 99, LIMA->LAD atretic, VG->D1 100, VG->OM1->2 100, VG->PDA->RPL 90p (4.0x23 Vision BMS);  c. 07/2012 Echo: EF 55%, gr1 DD.  [name] Cataract   . CKD (chronic kidney disease), stage III    a. renal bx 2008: GLN with vasculitis  . Diffuse large B cell lymphoma ([employer]) 08/07/2016  . GERD (gastroesophageal reflux disease)   . Glaucoma    a. Cannot see out of L eye.  . Hematuria  Microscopic  . Hyperglycemia    Patient reported while on prednisone, had to take insulin  . Hyperlipemia   . Hypertension   . Hypothyroidism   . ILD (interstitial lung disease) ([place])   . Iritis   . Local infection of skin and subcutaneous tissue   . Membranoproliferative nephritis   . Myocardial infarction ([place]) 2002  . Neutropenia, drug-induced ([place])   . Recurrent boils   . Residual foreign body in soft  tissue   . Vasculitis ([place])    a.  pauciimmune vasculitis with renal involvement and hx of transient hemoptysis in the past with associated BOOP, 2008 (renal bx 2008: GLN with vasculitis). b. History of treatment with 2 cycles of Cytoxan and pheresis. H/o hemoptysis and pulm hemorrhage with 2nd cycle of cytoxan.    SURGICAL HISTORY: Past Surgical History:  Procedure Laterality Date  . BOWEL RESECTION N/A 07/12/2016   Procedure: SMALL BOWEL RESECTION;  Surgeon: Rolm Bookbinder, MD;  Location: [facility];  Service: General;  Laterality: N/A;  . CATARACT EXTRACTION Bilateral   . CORONARY ARTERY BYPASS GRAFT  2002  . CORONARY STENT INTERVENTION N/A 03/31/2017   Procedure: CORONARY STENT INTERVENTION;  Surgeon: Martinique, Peter M, MD;  Location: [facility];  Service: Cardiovascular;  Laterality: N/A;  . IR FLUORO GUIDE PORT INSERTION RIGHT  08/20/2016  . IR REMOVAL TUN ACCESS W/ PORT W/O FL MOD SED  01/20/2017  . IR US GUIDE VASC ACCESS RIGHT  08/20/2016  . LAPAROSCOPY N/A 07/12/2016   Procedure: LAPAROSCOPY DIAGNOSTIC;  Surgeon: Rolm Bookbinder, MD;  Location: [facility];  Service: General;  Laterality: N/A;  . LEFT HEART CATHETERIZATION WITH CORONARY/GRAFT ANGIOGRAM N/A 07/26/2012   Procedure: LEFT HEART CATHETERIZATION WITH Beatrix Fetters;  Surgeon: Wellington Hampshire, MD;  Location: [facility] C[facility];  Service: Cardiovascular;  Laterality: N/A;  . LUNG BIOPSY    . PERCUTANEOUS CORONARY STENT INTERVENTION (PCI-S)  07/26/2012   Procedure: PERCUTANEOUS CORONARY STENT INTERVENTION (PCI-S);  Surgeon: Wellington Hampshire, MD;  Location[facility] C[facility];  Service: Cardiovascular;;  . RENAL BIOPSY    . RIGHT/LEFT HEART CATH AND CORONARY/GRAFT ANGIOGRAPHY N/A 03/23/2017   Procedure: RIGHT/LEFT HEART CATH AND CORONARY/GRAFT ANGIOGRAPHY;  Surgeon: Martinique, Peter M, MD;  Location: [facility];  Service: Cardiovascular;  Laterality: N/A;  . TOE AMPUTATION  2009   hammer toe    SOCIAL HISTORY: Social History     Socioeconomic History  . Marital status: Married    Spouse name: Not on file  . Number of children: Not on file  . Years of education: Not on file  . Highest education level: Not on file  Occupational History  . Occupation: [employer]  . Smoking status: Never Smoker  . Smokeless tobacco: Never Used  Vaping Use  . Vaping Use: Never used  Substance and Sexual Activity  . Alcohol use: No  . Drug use: No  . Sexual activity: Not on file  Other Topics Concern  . Not on file  Social History Narrative   Regular exercise - yes      [facility] (07/12/16):   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  no pets currently. No bird or mold exposure.   Social Determinants of Health   Financial Resource Strain:   . Difficulty of Paying Living Expenses: Not on file  Food Insecurity:   . Worried About Charity fundraiser in the Last Year: Not on file  . Ran  Out of Food in the Last Year: Not on file  Transportation Needs:   . Lack of Transportation (Medical): Not on file  . Lack of Transportation (Non-Medical): Not on file  Physical Activity:   . Days of Exercise per Week: Not on file  . Minutes of Exercise per Session: Not on file  Stress:   . Feeling of Stress : Not on file  Social Connections:   . Frequency of Communication with Friends and Family: Not on file  . Frequency of Social Gatherings with Friends and Family: Not on file  . Attends Religious Services: Not on file  . Active Member of Clubs or Organizations: Not on file  . Attends Archivist Meetings: Not on file  . Marital Status: Not on file  Intimate Partner Violence:   . Fear of Current or Ex-Partner: Not on file  . Emotionally Abused: Not on file  . Physically Abused: Not on file  . Sexually Abused: Not on file    FAMILY HISTORY: Family History  Problem Relation Age of Onset  . Heart disease Mother   . Diabetes Mother   . Prostate cancer Father   . Depression  Other   . Diabetes Other   . Prostate cancer Other   . Colon polyps Neg Hx   . Colon cancer Neg Hx   . Rectal cancer Neg Hx   . Stomach cancer Neg Hx   . Esophageal cancer Neg Hx     ALLERGIES:  has No Known Allergies.  MEDICATIONS:  Current Outpatient Medications  Medication Sig Dispense Refill  . acetaminophen (TYLENOL) 500 MG tablet Take 500 mg by mouth daily as needed for moderate pain or headache.     [name] aspirin EC 81 MG EC tablet Take 1 tablet (81 mg total) by mouth daily. 30 tablet   . azaTHIOprine (IMURAN) 50 MG tablet Take 100 mg by mouth daily.    . clopidogrel (PLAVIX) 75 MG tablet TAKE 1 TABLET BY MOUTH  DAILY 90 tablet 2  . dorzolamide-timolol (COSOPT) 22.3-6.8 MG/ML ophthalmic solution Place 1 drop into both eyes 2 (two) times daily.     . finasteride (PROSCAR) 5 MG tablet Take 5 mg by mouth at bedtime.     . furosemide (LASIX) 20 MG tablet Take 1 tablet (20 mg total) by mouth 2 (two) times daily. 180 tablet 3  . levothyroxine (SYNTHROID, LEVOTHROID) 100 MCG tablet Take 100 mcg by mouth daily before breakfast.     . metoprolol succinate (TOPROL-XL) 25 MG 24 hr tablet TAKE 1 TABLET BY MOUTH  DAILY 90 tablet 2  . Multiple Vitamin (MULTIVITAMIN) tablet Take 1 tablet by mouth daily.      . nitroGLYCERIN (NITROSTAT) 0.4 MG SL tablet Place 1 tablet (0.4 mg total) under the tongue every 5 (five) minutes as needed for chest pain. 25 tablet 3  . pantoprazole (PROTONIX) 40 MG tablet TAKE 1 TABLET BY MOUTH  DAILY 90 tablet 2  . rosuvastatin (CRESTOR) 10 MG tablet Take 10 mg every morning by mouth.    . Tamsulosin HCl (FLOMAX) 0.4 MG CAPS Take 0.4 mg by mouth at bedtime.      No current facility-administered medications for this visit.   Facility-Administered Medications Ordered in Other Visits  Medication Dose Route Frequency Provider Last Rate Last Admin  . sodium chloride flush (NS) 0.9 % injection 10 mL  10 mL Intracatheter PRN Brunetta Genera, MD   10 mL at 08/24/16 1653    . sodium  chloride flush (NS) 0.9 % injection 10 mL  10 mL Intracatheter PRN Brunetta Genera, MD   10 mL at 10/07/16 1555    REVIEW OF SYSTEMS:  A 10+ POINT REVIEW OF SYSTEMS WAS OBTAINED including neurology, dermatology, psychiatry, cardiac, respiratory, lymph, extremities, GI, GU, Musculoskeletal, constitutional, breasts, reproductive, HEENT.  All pertinent positives are noted in the HPI.  All others are negative.   PHYSICAL EXAMINATION: ECOG FS:2 - Symptomatic, <50% confined to bed  There were no vitals filed for this visit. Wt Readings from Last 3 Encounters:  02/12/20 236 lb 6.4 oz (107.2 kg)  09/13/19 244 lb 6.4 oz (110.9 kg)  07/17/19 246 lb (111.6 kg)   There is no height or weight on file to calculate BMI.    NAD GENERAL:alert, in no acute distress and comfortable SKIN: no acute rashes, no significant lesions EYES: conjunctiva are pink and non-injected, sclera anicteric OROPHARYNX: MMM, no exudates, no oropharyngeal erythema or ulceration NECK: supple, no JVD LYMPH:  no palpable lymphadenopathy in the cervical, axillary or inguinal regions LUNGS: clear to auscultation b/l with normal respiratory effort HEART: regular rate & rhythm ABDOMEN:  normoactive bowel sounds , non tender, not distended. No palpable hepatosplenomegaly.  Extremity: no pedal edema PSYCH: alert & oriented x 3 with fluent speech NEURO: no focal motor/sensory deficits  LABORATORY DATA:  I have reviewed the data as listed . CBC Latest Ref Rng & Units 03/14/2020 09/13/2019 03/16/2019  WBC 4.0 - 10.5 K/uL 5.3 5.7 5.3  Hemoglobin 13.0 - 17.0 g/dL 12.3(L) 12.7(L) 12.9(L)  Hematocrit 39 - 52 % 37.6(L) 39.4 39.1  Platelets 150 - 400 K/uL 156 147(L) 144(L)   . CMP Latest Ref Rng & Units 03/14/2020 02/12/2020 09/13/2019  Glucose 70 - 99 mg/dL 159(H) 131(H) 122(H)  BUN 8 - 23 mg/dL 34(H) 35(H) 45(H)  Creatinine 0.61 - 1.24 mg/dL 2.38(H) 2.27(H) 2.41(H)  Sodium 135 - 145 mmol/L 139 139 144  Potassium  3.5 - 5.1 mmol/L 4.1 4.4 4.1  Chloride 98 - 111 mmol/L 109 104 109  CO2 22 - 32 mmol/L 23 21 24   Calcium 8.9 - 10.3 mg/dL 8.8(L) 8.8 8.9  Total Protein 6.5 - 8.1 g/dL 6.9 - 6.8  Total Bilirubin 0.3 - 1.2 mg/dL 0.6 - 0.5  Alkaline Phos 38 - 126 U/L 59 - 63  AST 15 - 41 U/L 17 - 15  ALT 0 - 44 U/L 17 - 13    Lab Results  Component Value Date   LDH 185 03/14/2020     RADIOGRAPHIC STUDIES: I have personally reviewed the radiological images as listed and agreed with the findings in the report.  Nm Pet Image Restag (ps) Skull Base To Thigh  Result Date: 10/22/2016 CLINICAL DATA:  Subsequent treatment strategy for diffuse large B-cell non-Hodgkin's lymphoma. EXAM: NUCLEAR MEDICINE PET SKULL BASE TO THIGH TECHNIQUE: 11.15 mCi F-18 FDG was injected intravenously. Full-ring PET imaging was performed from the skull base to thigh after the radiotracer. CT data was obtained and used for attenuation correction and anatomic localization. FASTING BLOOD GLUCOSE:  Value: 102 mg/dl COMPARISON:  None. FINDINGS: NECK No hypermetabolic lymph nodes in the neck. CHEST The FDG avid right axillary lymph nodes and epicardial lymph nodes have resolved in the interval. The focal hypermetabolic activity in the left ventricular myocardium described on the previous study is unchanged. No new abnormalities in the chest. ABDOMEN/PELVIS Significant improvement in the abdomen. No definitive FDG avid disease remains in the liver or spleen. The adenopathy in  the abdomen has resolved. Minimal uptake in the normal appearing left adrenal gland is of no significance. No abnormal uptake seen in the soft tissues of the pelvis. The omental nodularity seen previously has resolved. There is increased attenuation in the fat associated with the anterior ventral hernia with low level uptake, likely inflammatory. The maximum SUV is measured on image 161 is 3.7. SKELETON There has been significant overall improvement in the bones as well. Uptake  in the anterior left acetabulum has significantly diminished with a maximum SUV of 3.4 today versus 29.4 previously. The left iliac uptake seen previously is no longer significantly FDG avid. There is mild focal uptake in the posterior left iliac bone on series 4, image 166 which was not seen previously and demonstrates a maximum SUV of 4.26 today. No other bony abnormalities are identified. IMPRESSION: 1. Significant interval improvement. No FDG avid disease remains in the soft tissues of the neck, chest, abdomen, or pelvis. Very mild uptake remains in the known left anterior acetabular lesion. Mild new uptake in the posterior left iliac bone is nonspecific but could represent mild involvement. Recommend attention on follow-up. 2. Increased attenuation and mild uptake in the fat of a lower midline ventral hernia is likely inflammatory. Recommend attention on follow-up. 3. No other interval change. Electronically Signed   By: Dorise Bullion III M.D   On: 10/22/2016 15:33   PET/CT 08/06/2016: IMPRESSION: 1. In addition to hypermetabolic nodal involvement of the right axilla, pericardial space, porta hepatis/mesenteric root, and retroperitoneum, there is extensive involvement of the liver as well as peritoneal involvement along the liver surface, splenic surface, and upper omentum. 2. Hypermetabolic bony lesions in the left pelvis compatible with malignant involvement. 3. There is a focus of hypermetabolic activity along the anterolateral wall of the left ventricle. Normally such findings turn out to simply be hypermetabolic myocardium. Strictly speaking given how focal this appears I cannot completely exclude the possibility of tumor involvement within or along the myocardium.   Electronically Signed   By: Van Clines M.D.   On: 08/06/2016 12:48   PET/CT 12/29/2016  IMPRESSION: 1. No evidence of residual or recurrent hypermetabolic lymphoma. 2.  Aortic Atherosclerosis  (ICD10-I70.0). 3. Dilatation of the infrarenal aorta and pelvic vasculature, as detailed above.   Electronically Signed   By: Abigail Miyamoto M.D.   On: 12/29/2016 15:17   ASSESSMENT & PLAN:   84 y.o.  male with multiple medical co-morbidities including cardiac co-morbidities, IPF, vascular on chronic immunosupression with    1) Stage IV Diffuse large B-cell lymphoma involving the small intestine (germinal cell phenotype) with complete thickness involvement of bowel and abutting serosa.- currently in remission.  Initial PET/CT scan showed extensive involvement with DLBCL, hypermetabolic nodal involvement of the right axilla, pericardial space, porta hepatis/mesenteric root, and retroperitoneum, there is extensive involvement of the liver as well as peritoneal involvement along the liver surface, splenic surface, and upper omentum. 2. Hypermetabolic bony lesions in the left pelvis compatible with malignant involvement. 3. There is a focus of hypermetabolic activity along the anterolateral wall of the left ventricle.   s/p 6 cycles of R-CEOP .   PET/CT scan on 10/22/2016 after 3 cycles of chemotherapy shows excellent response to treatment. PET/CT scan on 12/29/2016 after 6 cycles of treatment shows No evidence of residual or recurrent hypermetabolic lymphoma  A)Sh/oneutropenic fever and E.coli/pseudomonal bacteremia/HCAP after the first cycle of treatment.  Significant chemotherapy related thrombocytopenia - now resolved. B) h/o hospitalization for perforation diverticulitis - no  issues currently  2) Chronic kidney disease stage III baseline creatinine about 2 to 2.5. - stable today 3) Coronary disease status post PCI and CABG - had a myocardial perfusion imaging scan on 06/30/2016 that showed a moderately decreased ejection fraction of 30-44%- following with cardiology for management. Was having some SOB/DOE and had cardiac cath with PCI to mid LAD on11/15/2018. 4) History of pulmonary and  renal vasculitis back on chronic Imuran per Dr Meredeth Ide 5) Neutropenia -resolved  6) Thrombocytopenia due to chemotherapy -resolved  7) Anemia - much improved hgb is up to 12.9 8) Arthritis, especially in right knee -Week of 09/18/17 he had fluid drawn form right knee and given cortisone shot by PCP -Encouraged him to be active with walking or PT   PLAN:  - labs reviewed with patient. Stable. LDH WNL -No clinical or lab evidence of lymphoma recurrence at this time. -Continue f/u with Dermatologist for mx of recurrent squamous cell carcinomas. (Chronic immune suppression from Imuran is likely risk factor).           FOLLOW UP:  RTC with Dr Irene Limbo with labs in 6 months  The total time spent in the appt was 20 minutes and more than 50% was on counseling and direct patient cares.  All of the patient's questions were answered with apparent satisfaction. The patient knows to call the clinic with any problems, questions or concerns.   Sullivan Lone MD [place] AAHIVMS [facility] Hematology/Oncology Physician [facility]  (Office):       [phone] (Work cell):  [phone] (Fax):           [phone]  I, Yevette Edwards, am acting as a scribe for Dr. Sullivan Lone.   .I have reviewed the above documentation for accuracy and completeness, and I agree with the above. Brunetta Genera MD

## 2020-03-13 ENCOUNTER — Other Ambulatory Visit: Payer: Self-pay | Admitting: *Deleted

## 2020-03-13 DIAGNOSIS — C8338 Diffuse large B-cell lymphoma, lymph nodes of multiple sites: Secondary | ICD-10-CM

## 2020-03-14 ENCOUNTER — Inpatient Hospital Stay: Payer: Medicare Other

## 2020-03-14 ENCOUNTER — Other Ambulatory Visit: Payer: Self-pay

## 2020-03-14 ENCOUNTER — Inpatient Hospital Stay: Payer: Medicare Other | Attending: Hematology | Admitting: Hematology

## 2020-03-14 VITALS — BP 146/83 | HR 93 | Temp 97.7°F | Resp 18 | Ht 72.0 in | Wt 236.7 lb

## 2020-03-14 DIAGNOSIS — D84821 Immunodeficiency due to drugs: Secondary | ICD-10-CM | POA: Insufficient documentation

## 2020-03-14 DIAGNOSIS — I129 Hypertensive chronic kidney disease with stage 1 through stage 4 chronic kidney disease, or unspecified chronic kidney disease: Secondary | ICD-10-CM | POA: Insufficient documentation

## 2020-03-14 DIAGNOSIS — Z9049 Acquired absence of other specified parts of digestive tract: Secondary | ICD-10-CM | POA: Insufficient documentation

## 2020-03-14 DIAGNOSIS — E785 Hyperlipidemia, unspecified: Secondary | ICD-10-CM | POA: Diagnosis not present

## 2020-03-14 DIAGNOSIS — Z8249 Family history of ischemic heart disease and other diseases of the circulatory system: Secondary | ICD-10-CM | POA: Insufficient documentation

## 2020-03-14 DIAGNOSIS — I2510 Atherosclerotic heart disease of native coronary artery without angina pectoris: Secondary | ICD-10-CM | POA: Insufficient documentation

## 2020-03-14 DIAGNOSIS — I700 Atherosclerosis of aorta: Secondary | ICD-10-CM | POA: Diagnosis not present

## 2020-03-14 DIAGNOSIS — Z833 Family history of diabetes mellitus: Secondary | ICD-10-CM | POA: Insufficient documentation

## 2020-03-14 DIAGNOSIS — I25118 Atherosclerotic heart disease of native coronary artery with other forms of angina pectoris: Secondary | ICD-10-CM | POA: Diagnosis not present

## 2020-03-14 DIAGNOSIS — D649 Anemia, unspecified: Secondary | ICD-10-CM | POA: Insufficient documentation

## 2020-03-14 DIAGNOSIS — Z8042 Family history of malignant neoplasm of prostate: Secondary | ICD-10-CM | POA: Diagnosis not present

## 2020-03-14 DIAGNOSIS — K219 Gastro-esophageal reflux disease without esophagitis: Secondary | ICD-10-CM | POA: Diagnosis not present

## 2020-03-14 DIAGNOSIS — I252 Old myocardial infarction: Secondary | ICD-10-CM | POA: Diagnosis not present

## 2020-03-14 DIAGNOSIS — N183 Chronic kidney disease, stage 3 unspecified: Secondary | ICD-10-CM | POA: Insufficient documentation

## 2020-03-14 DIAGNOSIS — K439 Ventral hernia without obstruction or gangrene: Secondary | ICD-10-CM | POA: Diagnosis not present

## 2020-03-14 DIAGNOSIS — Z818 Family history of other mental and behavioral disorders: Secondary | ICD-10-CM | POA: Diagnosis not present

## 2020-03-14 DIAGNOSIS — Z8719 Personal history of other diseases of the digestive system: Secondary | ICD-10-CM | POA: Diagnosis not present

## 2020-03-14 DIAGNOSIS — C8330 Diffuse large B-cell lymphoma, unspecified site: Secondary | ICD-10-CM | POA: Diagnosis not present

## 2020-03-14 DIAGNOSIS — M1711 Unilateral primary osteoarthritis, right knee: Secondary | ICD-10-CM | POA: Insufficient documentation

## 2020-03-14 DIAGNOSIS — Z79899 Other long term (current) drug therapy: Secondary | ICD-10-CM | POA: Diagnosis not present

## 2020-03-14 DIAGNOSIS — C8338 Diffuse large B-cell lymphoma, lymph nodes of multiple sites: Secondary | ICD-10-CM | POA: Diagnosis not present

## 2020-03-14 LAB — CMP (CANCER CENTER ONLY)
ALT: 17 U/L (ref 0–44)
AST: 17 U/L (ref 15–41)
Albumin: 3.3 g/dL — ABNORMAL LOW (ref 3.5–5.0)
Alkaline Phosphatase: 59 U/L (ref 38–126)
Anion gap: 7 (ref 5–15)
BUN: 34 mg/dL — ABNORMAL HIGH (ref 8–23)
CO2: 23 mmol/L (ref 22–32)
Calcium: 8.8 mg/dL — ABNORMAL LOW (ref 8.9–10.3)
Chloride: 109 mmol/L (ref 98–111)
Creatinine: 2.38 mg/dL — ABNORMAL HIGH (ref 0.61–1.24)
GFR, Estimated: 27 mL/min — ABNORMAL LOW (ref >60)
Glucose, Bld: 159 mg/dL — ABNORMAL HIGH (ref 70–99)
Potassium: 4.1 mmol/L (ref 3.5–5.1)
Sodium: 139 mmol/L (ref 135–145)
Total Bilirubin: 0.6 mg/dL (ref 0.3–1.2)
Total Protein: 6.9 g/dL (ref 6.5–8.1)

## 2020-03-14 LAB — CBC WITH DIFFERENTIAL (CANCER CENTER ONLY)
Abs Immature Granulocytes: 0.02 10*3/uL (ref 0.00–0.07)
Basophils Absolute: 0 10*3/uL (ref 0.0–0.1)
Basophils Relative: 1 %
Eosinophils Absolute: 0.1 10*3/uL (ref 0.0–0.5)
Eosinophils Relative: 2 %
HCT: 37.6 % — ABNORMAL LOW (ref 39.0–52.0)
Hemoglobin: 12.3 g/dL — ABNORMAL LOW (ref 13.0–17.0)
Immature Granulocytes: 0 %
Lymphocytes Relative: 6 %
Lymphs Abs: 0.3 10*3/uL — ABNORMAL LOW (ref 0.7–4.0)
MCH: 33.1 pg (ref 26.0–34.0)
MCHC: 32.7 g/dL (ref 30.0–36.0)
MCV: 101.1 fL — ABNORMAL HIGH (ref 80.0–100.0)
Monocytes Absolute: 0.5 10*3/uL (ref 0.1–1.0)
Monocytes Relative: 9 %
Neutro Abs: 4.3 10*3/uL (ref 1.7–7.7)
Neutrophils Relative %: 82 %
Platelet Count: 156 10*3/uL (ref 150–400)
RBC: 3.72 MIL/uL — ABNORMAL LOW (ref 4.22–5.81)
RDW: 16.1 % — ABNORMAL HIGH (ref 11.5–15.5)
WBC Count: 5.3 10*3/uL (ref 4.0–10.5)
nRBC: 0 % (ref 0.0–0.2)

## 2020-03-14 LAB — LACTATE DEHYDROGENASE: LDH: 185 U/L (ref 98–192)

## 2020-03-26 ENCOUNTER — Other Ambulatory Visit: Payer: Medicare Other

## 2020-03-26 ENCOUNTER — Other Ambulatory Visit: Payer: Self-pay

## 2020-03-26 DIAGNOSIS — I5043 Acute on chronic combined systolic (congestive) and diastolic (congestive) heart failure: Secondary | ICD-10-CM

## 2020-03-26 DIAGNOSIS — R0600 Dyspnea, unspecified: Secondary | ICD-10-CM

## 2020-03-27 ENCOUNTER — Telehealth: Payer: Self-pay

## 2020-03-27 DIAGNOSIS — Z79899 Other long term (current) drug therapy: Secondary | ICD-10-CM

## 2020-03-27 DIAGNOSIS — R0600 Dyspnea, unspecified: Secondary | ICD-10-CM

## 2020-03-27 DIAGNOSIS — R7989 Other specified abnormal findings of blood chemistry: Secondary | ICD-10-CM

## 2020-03-27 LAB — BASIC METABOLIC PANEL
BUN/Creatinine Ratio: 14 (ref 10–24)
BUN: 29 mg/dL — ABNORMAL HIGH (ref 8–27)
CO2: 24 mmol/L (ref 20–29)
Calcium: 9 mg/dL (ref 8.6–10.2)
Chloride: 102 mmol/L (ref 96–106)
Creatinine, Ser: 2.09 mg/dL — ABNORMAL HIGH (ref 0.76–1.27)
GFR calc Af Amer: 33 mL/min/{1.73_m2} — ABNORMAL LOW (ref >59)
GFR calc non Af Amer: 29 mL/min/{1.73_m2} — ABNORMAL LOW (ref >59)
Glucose: 143 mg/dL — ABNORMAL HIGH (ref 65–99)
Potassium: 4.5 mmol/L (ref 3.5–5.2)
Sodium: 139 mmol/L (ref 134–144)

## 2020-03-27 LAB — PRO B NATRIURETIC PEPTIDE: NT-Pro BNP: 1357 pg/mL — ABNORMAL HIGH (ref 0–486)

## 2020-03-27 MED ORDER — FUROSEMIDE 20 MG PO TABS
40.0000 mg | ORAL_TABLET | Freq: Every day | ORAL | 3 refills | Status: DC
Start: 2020-03-27 — End: 2020-09-09

## 2020-03-27 NOTE — Telephone Encounter (Signed)
The patient has been notified of the result and verbalized understanding.  All questions (if any) were answered. [place], RN 03/27/2020 2:02 PM   Patient will increase his dose of lasix to 40 mg daily and then come back on 04/17/20 for BMET and BNP.

## 2020-03-27 NOTE — Telephone Encounter (Signed)
-----   Message from [name], MD sent at 03/27/2020  7:41 AM EST ----- Increase lasix to 40 mg daily and f/u BNP/BMET in 3 weeks Has edema and BNP elevated

## 2020-04-01 DIAGNOSIS — N179 Acute kidney failure, unspecified: Secondary | ICD-10-CM | POA: Diagnosis not present

## 2020-04-01 DIAGNOSIS — N057 Unspecified nephritic syndrome with diffuse crescentic glomerulonephritis: Secondary | ICD-10-CM | POA: Diagnosis not present

## 2020-04-01 DIAGNOSIS — R809 Proteinuria, unspecified: Secondary | ICD-10-CM | POA: Diagnosis not present

## 2020-04-01 DIAGNOSIS — E1122 Type 2 diabetes mellitus with diabetic chronic kidney disease: Secondary | ICD-10-CM | POA: Diagnosis not present

## 2020-04-01 DIAGNOSIS — I129 Hypertensive chronic kidney disease with stage 1 through stage 4 chronic kidney disease, or unspecified chronic kidney disease: Secondary | ICD-10-CM | POA: Diagnosis not present

## 2020-04-01 DIAGNOSIS — N184 Chronic kidney disease, stage 4 (severe): Secondary | ICD-10-CM | POA: Diagnosis not present

## 2020-04-01 DIAGNOSIS — N058 Unspecified nephritic syndrome with other morphologic changes: Secondary | ICD-10-CM | POA: Diagnosis not present

## 2020-04-01 DIAGNOSIS — N400 Enlarged prostate without lower urinary tract symptoms: Secondary | ICD-10-CM | POA: Diagnosis not present

## 2020-04-01 DIAGNOSIS — E7800 Pure hypercholesterolemia, unspecified: Secondary | ICD-10-CM | POA: Diagnosis not present

## 2020-04-01 DIAGNOSIS — I2510 Atherosclerotic heart disease of native coronary artery without angina pectoris: Secondary | ICD-10-CM | POA: Diagnosis not present

## 2020-04-01 DIAGNOSIS — I5042 Chronic combined systolic (congestive) and diastolic (congestive) heart failure: Secondary | ICD-10-CM | POA: Diagnosis not present

## 2020-04-01 DIAGNOSIS — E669 Obesity, unspecified: Secondary | ICD-10-CM | POA: Diagnosis not present

## 2020-04-03 DIAGNOSIS — H40023 Open angle with borderline findings, high risk, bilateral: Secondary | ICD-10-CM | POA: Diagnosis not present

## 2020-04-03 DIAGNOSIS — H5211 Myopia, right eye: Secondary | ICD-10-CM | POA: Diagnosis not present

## 2020-04-03 DIAGNOSIS — H401211 Low-tension glaucoma, right eye, mild stage: Secondary | ICD-10-CM | POA: Diagnosis not present

## 2020-04-03 DIAGNOSIS — Z9841 Cataract extraction status, right eye: Secondary | ICD-10-CM | POA: Diagnosis not present

## 2020-04-03 DIAGNOSIS — H44512 Absolute glaucoma, left eye: Secondary | ICD-10-CM | POA: Diagnosis not present

## 2020-04-03 DIAGNOSIS — Z9842 Cataract extraction status, left eye: Secondary | ICD-10-CM | POA: Diagnosis not present

## 2020-04-03 DIAGNOSIS — H401131 Primary open-angle glaucoma, bilateral, mild stage: Secondary | ICD-10-CM | POA: Diagnosis not present

## 2020-04-03 DIAGNOSIS — H401133 Primary open-angle glaucoma, bilateral, severe stage: Secondary | ICD-10-CM | POA: Diagnosis not present

## 2020-04-17 ENCOUNTER — Other Ambulatory Visit: Payer: Medicare Other | Admitting: *Deleted

## 2020-04-17 ENCOUNTER — Other Ambulatory Visit: Payer: Self-pay

## 2020-04-17 DIAGNOSIS — R0600 Dyspnea, unspecified: Secondary | ICD-10-CM | POA: Diagnosis not present

## 2020-04-17 DIAGNOSIS — R7989 Other specified abnormal findings of blood chemistry: Secondary | ICD-10-CM

## 2020-04-17 DIAGNOSIS — Z79899 Other long term (current) drug therapy: Secondary | ICD-10-CM | POA: Diagnosis not present

## 2020-04-18 ENCOUNTER — Telehealth: Payer: Self-pay | Admitting: Cardiovascular Disease

## 2020-04-18 ENCOUNTER — Telehealth: Payer: Self-pay

## 2020-04-18 DIAGNOSIS — Z951 Presence of aortocoronary bypass graft: Secondary | ICD-10-CM

## 2020-04-18 DIAGNOSIS — R0600 Dyspnea, unspecified: Secondary | ICD-10-CM

## 2020-04-18 DIAGNOSIS — R7989 Other specified abnormal findings of blood chemistry: Secondary | ICD-10-CM

## 2020-04-18 DIAGNOSIS — I5043 Acute on chronic combined systolic (congestive) and diastolic (congestive) heart failure: Secondary | ICD-10-CM

## 2020-04-18 LAB — BASIC METABOLIC PANEL
BUN/Creatinine Ratio: 14 (ref 10–24)
BUN: 32 mg/dL — ABNORMAL HIGH (ref 8–27)
CO2: 24 mmol/L (ref 20–29)
Calcium: 8.9 mg/dL (ref 8.6–10.2)
Chloride: 103 mmol/L (ref 96–106)
Creatinine, Ser: 2.35 mg/dL — ABNORMAL HIGH (ref 0.76–1.27)
GFR calc Af Amer: 29 mL/min/{1.73_m2} — ABNORMAL LOW (ref >59)
GFR calc non Af Amer: 25 mL/min/{1.73_m2} — ABNORMAL LOW (ref >59)
Glucose: 84 mg/dL (ref 65–99)
Potassium: 4.5 mmol/L (ref 3.5–5.2)
Sodium: 141 mmol/L (ref 134–144)

## 2020-04-18 LAB — PRO B NATRIURETIC PEPTIDE: NT-Pro BNP: 1426 pg/mL — ABNORMAL HIGH (ref 0–486)

## 2020-04-18 NOTE — Telephone Encounter (Signed)
Patient returning Pam's call for results, connected call to Pam  

## 2020-04-18 NOTE — Telephone Encounter (Signed)
-----   Message from [name], MD sent at 04/18/2020  7:47 AM EST ----- BNP remains elevated and Cr elevated have him see CHF clinic

## 2020-04-18 NOTE — Telephone Encounter (Signed)
Will place order for referral to CHF clinic.

## 2020-07-07 DIAGNOSIS — N057 Unspecified nephritic syndrome with diffuse crescentic glomerulonephritis: Secondary | ICD-10-CM | POA: Diagnosis not present

## 2020-07-07 DIAGNOSIS — E1122 Type 2 diabetes mellitus with diabetic chronic kidney disease: Secondary | ICD-10-CM | POA: Diagnosis not present

## 2020-07-07 DIAGNOSIS — E7800 Pure hypercholesterolemia, unspecified: Secondary | ICD-10-CM | POA: Diagnosis not present

## 2020-07-07 DIAGNOSIS — N058 Unspecified nephritic syndrome with other morphologic changes: Secondary | ICD-10-CM | POA: Diagnosis not present

## 2020-07-07 DIAGNOSIS — I5042 Chronic combined systolic (congestive) and diastolic (congestive) heart failure: Secondary | ICD-10-CM | POA: Diagnosis not present

## 2020-07-07 DIAGNOSIS — I2510 Atherosclerotic heart disease of native coronary artery without angina pectoris: Secondary | ICD-10-CM | POA: Diagnosis not present

## 2020-07-07 DIAGNOSIS — N179 Acute kidney failure, unspecified: Secondary | ICD-10-CM | POA: Diagnosis not present

## 2020-07-07 DIAGNOSIS — N184 Chronic kidney disease, stage 4 (severe): Secondary | ICD-10-CM | POA: Diagnosis not present

## 2020-07-07 DIAGNOSIS — R809 Proteinuria, unspecified: Secondary | ICD-10-CM | POA: Diagnosis not present

## 2020-07-07 DIAGNOSIS — N400 Enlarged prostate without lower urinary tract symptoms: Secondary | ICD-10-CM | POA: Diagnosis not present

## 2020-07-07 DIAGNOSIS — G629 Polyneuropathy, unspecified: Secondary | ICD-10-CM | POA: Diagnosis not present

## 2020-07-07 DIAGNOSIS — I129 Hypertensive chronic kidney disease with stage 1 through stage 4 chronic kidney disease, or unspecified chronic kidney disease: Secondary | ICD-10-CM | POA: Diagnosis not present

## 2020-07-15 DIAGNOSIS — X32XXXD Exposure to sunlight, subsequent encounter: Secondary | ICD-10-CM | POA: Diagnosis not present

## 2020-07-15 DIAGNOSIS — L570 Actinic keratosis: Secondary | ICD-10-CM | POA: Diagnosis not present

## 2020-07-24 ENCOUNTER — Other Ambulatory Visit: Payer: Self-pay | Admitting: Cardiovascular Disease

## 2020-07-31 ENCOUNTER — Other Ambulatory Visit: Payer: Self-pay | Admitting: Cardiovascular Disease

## 2020-08-01 DIAGNOSIS — H401211 Low-tension glaucoma, right eye, mild stage: Secondary | ICD-10-CM | POA: Diagnosis not present

## 2020-08-01 DIAGNOSIS — H40023 Open angle with borderline findings, high risk, bilateral: Secondary | ICD-10-CM | POA: Diagnosis not present

## 2020-08-01 DIAGNOSIS — H401133 Primary open-angle glaucoma, bilateral, severe stage: Secondary | ICD-10-CM | POA: Diagnosis not present

## 2020-08-01 DIAGNOSIS — H401131 Primary open-angle glaucoma, bilateral, mild stage: Secondary | ICD-10-CM | POA: Diagnosis not present

## 2020-08-01 DIAGNOSIS — H44512 Absolute glaucoma, left eye: Secondary | ICD-10-CM | POA: Diagnosis not present

## 2020-08-05 NOTE — Progress Notes (Signed)
Cardiology Office Note   Date:  08/13/2020   ID:  Luke Mcbride, DOB [birth year], MRN [id number]  PCP:  Donato Heinz, MD  Cardiologist:  Dr. [name]    No chief complaint on file.     History of Present Illness: Luke Mcbride is a 85 y.o. male who presents for f/u CAD/CABG 2002, DM, HTN, HLD and CKD  He was admitted 07/2012 with NSTEMI and underwent BMS to the SVR-PDA/PL. His LIMA to LAD was atretic but disease in the LAD was moderate. SVG-OM and SVG to diagonal were known to be occluded.  06/21/16 myovue scar no ischemia low risk  However he was admitted for angina and had cath 03/23/17 with new native mid LAD lesion that was stented due to atretic LIMA Echo with EF 35-40% September 2019 had mild CHF when diuretic held by nephrology. Has done well with 20 mg lasix daily and baseline Cr around 2.4 Sees [place]  Diagnosed with Stage IV Diffuse large B-cell lymphoma involving the small intestine.. He had 6 inchesof his colon resected. Following surgery he had 6 rounds of chemo. F/u PET scan and was told he was in remission.   Activity limited by right knee arthritis and neuropathy likely from chemo therapy   No angina compliant with meds    Some neuropathy in feet   He and wife got vaccine   He has had some increased dyspnea Weight ok Compliant with meds Still with some edema  Past Medical History:  Diagnosis Date  . ALLERGIC RHINITIS   . Anemia    hx  . Barrett's esophagus   . BOOP (bronchiolitis obliterans with organizing pneumonia) ([place])    a. s/p R VATS 2008.  [name] CAD (coronary artery disease)    a. 05/2000: NSTEMI/CABG x 6: LIMA->LAD, VG->D1, VG->OM1->2, VG->PDA->RPL;  b. 07/2007 MV: high lat infarct, no ischemia, EF 47%;  c. Cath/PCI: LM nl, LAD20p, 41m, D1 nl, D2 60-70ost, D3 nl, LCX 70ost, 141m, OM1/OM2 min irregs, RCA 30 diff, PDA 99, LIMA->LAD atretic, VG->D1 100, VG->OM1->2 100, VG->PDA->RPL 90p (4.0x23 Vision BMS);  c. 07/2012 Echo: EF 55%, gr1 DD.  [name] Cataract    . CKD (chronic kidney disease), stage III ([place])    a. renal bx 2008: GLN with vasculitis  . Diffuse large B cell lymphoma ([place]) 08/07/2016  . GERD (gastroesophageal reflux disease)   . Glaucoma    a. Cannot see out of L eye.  . Hematuria    Microscopic  . Hyperglycemia    Patient reported while on prednisone, had to take insulin  . Hyperlipemia   . Hypertension   . Hypothyroidism   . ILD (interstitial lung disease) ([place])   . Iritis   . Local infection of skin and subcutaneous tissue   . Membranoproliferative nephritis   . Myocardial infarction ([place]) 2002  . Neutropenia, drug-induced ([place])   . Recurrent boils   . Residual foreign body in soft tissue   . Vasculitis ([place])    a.  pauciimmune vasculitis with renal involvement and hx of transient hemoptysis in the past with associated BOOP, 2008 (renal bx 2008: GLN with vasculitis). b. History of treatment with 2 cycles of Cytoxan and pheresis. H/o hemoptysis and pulm hemorrhage with 2nd cycle of cytoxan.    Past Surgical History:  Procedure Laterality Date  . BOWEL RESECTION N/A 07/12/2016   Procedure: SMALL BOWEL RESECTION;  Surgeon: Rolm Bookbinder, MD;  Location: [place];  Service: General;  Laterality: N/A;  . CATARACT EXTRACTION  Bilateral   . CORONARY ARTERY BYPASS GRAFT  2002  . CORONARY STENT INTERVENTION N/A 03/31/2017   Procedure: CORONARY STENT INTERVENTION;  Surgeon: Martinique, Sangeeta Youse M, MD;  Location: [place] [facility];  Service: Cardiovascular;  Laterality: N/A;  . IR FLUORO GUIDE PORT INSERTION RIGHT  08/20/2016  . IR REMOVAL TUN ACCESS W/ PORT W/O FL MOD SED  01/20/2017  . IR US GUIDE VASC ACCESS RIGHT  08/20/2016  . LAPAROSCOPY N/A 07/12/2016   Procedure: LAPAROSCOPY DIAGNOSTIC;  Surgeon: Rolm Bookbinder, MD;  Location: [place];  Service: General;  Laterality: N/A;  . LEFT HEART CATHETERIZATION WITH CORONARY/GRAFT ANGIOGRAM N/A 07/26/2012   Procedure: LEFT HEART CATHETERIZATION WITH Beatrix Fetters;  Surgeon: Wellington Hampshire, MD;  Location: [place] [facility];  Service: Cardiovascular;  Laterality: N/A;  . LUNG BIOPSY    . PERCUTANEOUS CORONARY STENT INTERVENTION (PCI-S)  07/26/2012   Procedure: PERCUTANEOUS CORONARY STENT INTERVENTION (PCI-S);  Surgeon: Wellington Hampshire, MD;  Location: [facility] C[facility];  Service: Cardiovascular;;  . RENAL BIOPSY    . RIGHT/LEFT HEART CATH AND CORONARY/GRAFT ANGIOGRAPHY N/A 03/23/2017   Procedure: RIGHT/LEFT HEART CATH AND CORONARY/GRAFT ANGIOGRAPHY;  Surgeon: Martinique, Sher Hellinger M, MD;  Location: [place] [facility];  Service: Cardiovascular;  Laterality: N/A;  . TOE AMPUTATION  2009   hammer toe     Current Outpatient Medications  Medication Sig Dispense Refill  . acetaminophen (TYLENOL) 500 MG tablet Take 500 mg by mouth daily as needed for moderate pain or headache.     [name] aspirin EC 81 MG EC tablet Take 1 tablet (81 mg total) by mouth daily. 30 tablet   . azaTHIOprine (IMURAN) 50 MG tablet Take 100 mg by mouth daily.    . clopidogrel (PLAVIX) 75 MG tablet TAKE 1 TABLET BY MOUTH  DAILY 90 tablet 1  . dorzolamide-timolol (COSOPT) 22.3-6.8 MG/ML ophthalmic solution Place 1 drop into both eyes 2 (two) times daily.     . finasteride (PROSCAR) 5 MG tablet Take 5 mg by mouth at bedtime.     . furosemide (LASIX) 20 MG tablet Take 2 tablets (40 mg total) by mouth daily. 180 tablet 3  . levothyroxine (SYNTHROID, LEVOTHROID) 100 MCG tablet Take 100 mcg by mouth daily before breakfast.    . metoprolol succinate (TOPROL-XL) 25 MG 24 hr tablet TAKE 1 TABLET BY MOUTH  DAILY 90 tablet 2  . Multiple Vitamin (MULTIVITAMIN) tablet Take 1 tablet by mouth daily.    . nitroGLYCERIN (NITROSTAT) 0.4 MG SL tablet Place 1 tablet (0.4 mg total) under the tongue every 5 (five) minutes as needed for chest pain. 25 tablet 3  . pantoprazole (PROTONIX) 40 MG tablet TAKE 1 TABLET BY MOUTH  DAILY 90 tablet 1  . rosuvastatin (CRESTOR) 10 MG tablet Take 10 mg every morning by mouth.    . tamsulosin (FLOMAX) 0.4 MG CAPS  capsule Take 0.4 mg by mouth at bedtime.     No current facility-administered medications for this visit.   Facility-Administered Medications Ordered in Other Visits  Medication Dose Route Frequency Provider Last Rate Last Admin  . sodium chloride flush (NS) 0.9 % injection 10 mL  10 mL Intracatheter PRN Brunetta Genera, MD   10 mL at 08/24/16 1653  . sodium chloride flush (NS) 0.9 % injection 10 mL  10 mL Intracatheter PRN Brunetta Genera, MD   10 mL at 10/07/16 1555    Allergies:   Patient has no known allergies.    Social History:  The patient  reports that he has never smoked. He has never used smokeless tobacco. He reports that he does not drink alcohol and does not use drugs.   Family History:  The patient's family history includes Depression in an other family member; Diabetes in his mother and another family member; Heart disease in his mother; Prostate cancer in his father and another family member.    ROS:  General:no colds or fevers, no weight changes Skin:no rashes or ulcers HEENT:no blurred vision, no congestion CV:see HPI PUL:see HPI GI:no diarrhea constipation or melena, no indigestion GU:no hematuria, no dysuria MS:no joint pain, no claudication Neuro:no syncope, no lightheadedness Endo:no diabetes, + thyroid disease  Wt Readings from Last 3 Encounters:  08/13/20 104.8 kg  03/14/20 107.4 kg  02/12/20 107.2 kg     PHYSICAL EXAM: VS:  BP 118/82   Pulse 83   Ht 6' (1.829 m)   Wt 104.8 kg   SpO2 98%   BMI 31.33 kg/m  , BMI Body mass index is 31.33 kg/m.   Affect appropriate Chronically ill white male  HEENT: normal Neck supple with no adenopathy JVP normal no bruits no thyromegaly Lungs clear with no wheezing and good diaphragmatic motion Heart:  S1/S2 no murmur, no rub, gallop or click PMI normal post sternotomy  Abdomen: benighn, BS positve, no tenderness, no AAA no bruit.  No HSM or HJR post surgery for lymphoma  Distal pulses intact  with no bruits Plus one bilateral  Edema a bit worse on right  Neuro non-focal Skin warm and dry No muscular weakness   EKG: 01/17/19 SR rate 81 nonspecific ST changes QT 402 02/10/20 SR rate 85 nonspecific ST changes    Recent Labs: 03/14/2020: ALT 17; Hemoglobin 12.3; Platelet Count 156 04/17/2020: BUN 32; Creatinine, Ser 2.35; NT-Pro BNP 1,426; Potassium 4.5; Sodium 141    Lipid Panel    Component Value Date/Time   CHOL 157 02/11/2014 0733   TRIG 150.0 (H) 02/11/2014 0733   HDL 36.30 (L) 02/11/2014 0733   CHOLHDL 4 02/11/2014 0733   VLDL 30.0 02/11/2014 0733   LDLCALC 91 02/11/2014 0733   LDLDIRECT 221.7 05/23/2007 0000       Other studies Reviewed: Additional studies/ records that were reviewed today include:  Echo:  03/04/20 EF 35-40% trivial MR normal RV  Echo 01/30/18 EF 35-40%inferior MI trivial MR Moderate bi atrial enlargement Cath 03/23/17  Atretic LIMA to LAD Native LAD stented SVG D1 occluded small native vessel Patent SVG to PDA/PLB with stent Occluded SVG to OM   ASSESSMENT AND PLAN:   1. CAD/CABG:  Stable no angina DES to native LAD 04/03/17    2. Lymphoma:  Diffuse B cell intestinal post surgical resection and chemo Rx in remission Seen by oncology 09/13/19 stable on Imuran   3. Edema: from arthritis and previous vein stripping CABG duplex with no DVT   4. CRF:  Diuretics cut back by nephrology  Cr 2.4  04/17/20  f/u Dr Azzie Roup    5. Thyroid:  On synthroid replacement f/u primary TSH   6. CHF:  EF 35-40% by TTE 03/04/20 normal RV trivial MR stable from echo 01/30/18 Rx limited by renal issues Currently on lasix and Toprol No ACE/ARB/ARNI due to renal failure refer to CHF clinic to see if other med optimization needed   7. HLD on statin labs with primary   Referred to CHF clinic    [facility]

## 2020-08-13 ENCOUNTER — Other Ambulatory Visit: Payer: Self-pay

## 2020-08-13 ENCOUNTER — Encounter: Payer: Self-pay | Admitting: Cardiovascular Disease

## 2020-08-13 ENCOUNTER — Ambulatory Visit (INDEPENDENT_AMBULATORY_CARE_PROVIDER_SITE_OTHER): Payer: Medicare Other | Admitting: Cardiovascular Disease

## 2020-08-13 VITALS — BP 118/82 | HR 83 | Ht 72.0 in | Wt 231.0 lb

## 2020-08-13 DIAGNOSIS — I5022 Chronic systolic (congestive) heart failure: Secondary | ICD-10-CM

## 2020-08-13 DIAGNOSIS — Z951 Presence of aortocoronary bypass graft: Secondary | ICD-10-CM

## 2020-08-13 NOTE — Patient Instructions (Signed)

## 2020-09-09 ENCOUNTER — Encounter (HOSPITAL_COMMUNITY): Payer: Self-pay | Admitting: Cardiology

## 2020-09-09 ENCOUNTER — Ambulatory Visit (HOSPITAL_COMMUNITY)
Admission: RE | Admit: 2020-09-09 | Discharge: 2020-09-09 | Disposition: A | Discharge status: Home / Self Care | Payer: Medicare Other | Source: Ambulatory Visit | Attending: Cardiology | Admitting: Cardiology

## 2020-09-09 VITALS — BP 152/80 | HR 85 | Wt 229.2 lb

## 2020-09-09 DIAGNOSIS — I5022 Chronic systolic (congestive) heart failure: Secondary | ICD-10-CM | POA: Insufficient documentation

## 2020-09-09 DIAGNOSIS — I130 Hypertensive heart and chronic kidney disease with heart failure and stage 1 through stage 4 chronic kidney disease, or unspecified chronic kidney disease: Secondary | ICD-10-CM | POA: Insufficient documentation

## 2020-09-09 DIAGNOSIS — E1122 Type 2 diabetes mellitus with diabetic chronic kidney disease: Secondary | ICD-10-CM | POA: Insufficient documentation

## 2020-09-09 DIAGNOSIS — N1832 Chronic kidney disease, stage 3b: Secondary | ICD-10-CM | POA: Diagnosis not present

## 2020-09-09 DIAGNOSIS — I252 Old myocardial infarction: Secondary | ICD-10-CM | POA: Diagnosis not present

## 2020-09-09 DIAGNOSIS — I2510 Atherosclerotic heart disease of native coronary artery without angina pectoris: Secondary | ICD-10-CM | POA: Diagnosis not present

## 2020-09-09 DIAGNOSIS — I255 Ischemic cardiomyopathy: Secondary | ICD-10-CM | POA: Insufficient documentation

## 2020-09-09 DIAGNOSIS — E7800 Pure hypercholesterolemia, unspecified: Secondary | ICD-10-CM

## 2020-09-09 DIAGNOSIS — Z955 Presence of coronary angioplasty implant and graft: Secondary | ICD-10-CM | POA: Insufficient documentation

## 2020-09-09 DIAGNOSIS — Z833 Family history of diabetes mellitus: Secondary | ICD-10-CM | POA: Diagnosis not present

## 2020-09-09 DIAGNOSIS — Z951 Presence of aortocoronary bypass graft: Secondary | ICD-10-CM | POA: Insufficient documentation

## 2020-09-09 DIAGNOSIS — Z8249 Family history of ischemic heart disease and other diseases of the circulatory system: Secondary | ICD-10-CM | POA: Insufficient documentation

## 2020-09-09 DIAGNOSIS — Z79899 Other long term (current) drug therapy: Secondary | ICD-10-CM | POA: Insufficient documentation

## 2020-09-09 DIAGNOSIS — Z7982 Long term (current) use of aspirin: Secondary | ICD-10-CM | POA: Insufficient documentation

## 2020-09-09 DIAGNOSIS — R0600 Dyspnea, unspecified: Secondary | ICD-10-CM | POA: Diagnosis not present

## 2020-09-09 HISTORY — DX: Heart failure, unspecified: I50.9

## 2020-09-09 LAB — BASIC METABOLIC PANEL
Anion gap: 9 (ref 5–15)
BUN: 41 mg/dL — ABNORMAL HIGH (ref 8–23)
CO2: 25 mmol/L (ref 22–32)
Calcium: 9 mg/dL (ref 8.9–10.3)
Chloride: 104 mmol/L (ref 98–111)
Creatinine, Ser: 2.69 mg/dL — ABNORMAL HIGH (ref 0.61–1.24)
GFR, Estimated: 23 mL/min — ABNORMAL LOW (ref >60)
Glucose, Bld: 127 mg/dL — ABNORMAL HIGH (ref 70–99)
Potassium: 4.2 mmol/L (ref 3.5–5.1)
Sodium: 138 mmol/L (ref 135–145)

## 2020-09-09 LAB — LIPID PANEL
Cholesterol: 158 mg/dL (ref 0–200)
HDL: 37 mg/dL — ABNORMAL LOW (ref >40)
LDL Cholesterol: 85 mg/dL (ref 0–99)
Total CHOL/HDL Ratio: 4.3 RATIO
Triglycerides: 178 mg/dL — ABNORMAL HIGH (ref <150)
VLDL: 36 mg/dL (ref 0–40)

## 2020-09-09 MED ORDER — SPIRONOLACTONE 25 MG PO TABS
12.5000 mg | ORAL_TABLET | Freq: Every day | ORAL | 3 refills | Status: DC
Start: 2020-09-09 — End: 2020-09-30

## 2020-09-09 MED ORDER — TORSEMIDE 40 MG PO TABS
40.0000 mg | ORAL_TABLET | Freq: Every day | ORAL | 0 refills | Status: DC
Start: 2020-09-09 — End: 2020-09-19

## 2020-09-09 MED ORDER — TORSEMIDE 40 MG PO TABS: 40.0000 mg | ORAL_TABLET | Freq: Every day | ORAL | 3 refills | Status: DC

## 2020-09-09 MED ORDER — SPIRONOLACTONE 25 MG PO TABS: 12.5000 mg | ORAL_TABLET | Freq: Every day | ORAL | 0 refills | Status: DC

## 2020-09-09 NOTE — Patient Instructions (Addendum)
EKG done today.  Labs done today. We will contact you only if your labs are abnormal.  STOP taking Lasix  START Torsemide 40mg  (1 tablet) by mouth daily.  START Spironolactone 12.5mg  (1/2 tablet) by mouth daily.  No other medication changes were made. Please continue all current medications as prescribed.  Please wear your compression hose daily, place them on as soon as you get up in the morning and remove before you go to bed at night. A prescription was provided to you today during your office visit. You can get this filled at your local pharmacy or at [facility] located at [address] [place], [place] [address] [phone]  Your physician recommends that you schedule a follow-up appointment in: 10 days for a lab only appointment and in 3 weeks with Dr. Aundra Dubin  If you have any questions or concerns before your next appointment please send [place] a message through [facility] or call our office at [phone].    TO LEAVE A MESSAGE FOR THE NURSE SELECT OPTION 2, PLEASE LEAVE A MESSAGE INCLUDING: . YOUR NAME . DATE OF BIRTH . CALL BACK NUMBER . REASON FOR CALL**this is important as we prioritize the call backs  YOU WILL RECEIVE A CALL BACK THE SAME DAY AS LONG AS YOU CALL BEFORE 4:00 PM   Do the following things EVERYDAY: 1) Weigh yourself in the morning before breakfast. Write it down and keep it in a log. 2) Take your medicines as prescribed 3) Eat low salt foods--Limit salt (sodium) to 2000 mg per day.  4) Stay as active as you can everyday 5) Limit all fluids for the day to less than 2 liters   At the [facility], you and your health needs are our priority. As part of our continuing mission to provide you with exceptional heart care, we have created designated Provider Care Teams. These Care Teams include your primary Cardiologist (physician) and Advanced Practice Providers (APPs- Physician Assistants and Nurse Practitioners) who all work together to  provide you with the care you need, when you need it.   You may see any of the following providers on your designated Care Team at your next follow up: [name] Dr Glori Bickers . Dr Loralie Champagne . Darrick Grinder, NP . Lyda Jester, PA . Audry Riles, PharmD   Please be sure to bring in all your medications bottles to every appointment.

## 2020-09-10 NOTE — Progress Notes (Signed)
Nephrology: Donato Heinz, MD  Cardiology: Dr. [name] HF Cardiology: Dr. Aundra Dubin  85 y.o. with history of CAD, ischemic cardiomyopathy, BOOP, pauci-immune vasculitis with CKD stage 3b, and diffuse large B cell lymphoma was referred by Dr. [name] for evaluation of CHF.  Patient had CABG in 2002.  In later years, he had PCI to native LAD and to sequential SVG-PDA/PLV.  LIMA is known to be atretic and SVG-D and sequential SVG-OM1/OM2 known to be occluded.  Most recent echo in 10/21 showed EF 35-40%.  Patient was diagnosed with pauci-immune vasculitis with glomerulonephritis by biopsy in 2008.  He is followed by Dr. Marval Regal and currently is on Imuran.  CKD stage 3b currently. He has been diagnosed by BOOP and used to be followed by pulmonary (not seen recently).  He was diagnosed with diffuse large B cell lymphoma in 3/18 and had 6 inches of his colon resected.  He was treated with chemotherapy.    Medical management of his cardiomyopathy has been limited by CKD stage 3b.  Today, BP is elevated.  Patient reports dyspnea walking more than 50 yards and with walking up stairs.  He has orthopnea, no PND.  No chest pain episodes and no lightheadedness or palpitations.  He drinks high sodium sports drinks frequently.  Due to edema in his legs, he has been taking Lasix 40 mg bid for about a month.   ECG (personally reviewed): NSR, nonspecific T wave flattening  REDS clip 35%  Labs (12/21): K 4.5, creatinine 2.35, pro-BNP 1426  PMH: 1. Type 2 diabetes 2. HTN 3. Diffuse large B cell lymphoma: Diagnosed 3/18, 6 inches of colon resected and treated with chemotherapy.  4. BOOP: Followed by pulmonary  5. GERD with Barrett's esophagus.  6. Glomerulonephritis with vasculitis: Pauci-immune vasculitis. Has had associated pulmonary hemorrhage.  - Prior treatment with Cytoxan and plasmapheresis, currently on Imuran.  - CKD stage 3b.  7. Chronic systolic CHF: Ischemic cardiomyopathy.   - Echo (9/18): EF  35-40%.  - Echo (10/21) with EF 35-40%, mild LVH, severe LAE, normal RV 8. CAD: CABG x 6 in 1/02 with LIMA-LAD, SVG-D1, seq SVG-OM1/2, seq SVG-PDA/PLV.  - NSTEMI 3/14: BMS to seq SVG-PDA/PLV, LIMA atretic but good flow down LAD, SVG-OM1/2 and SVG-D occluded.  - Angina 11/18: PCI to 80% mid LAD stenosis, seq SVG-PDA/PLV remained patent, other grafts occluded (LIMA atretic).   Social History   Socioeconomic History  . Marital status: Married    Spouse name: Not on file  . Number of children: Not on file  . Years of education: Not on file  . Highest education level: Not on file  Occupational History  . Occupation: [employer]  . Smoking status: Never Smoker  . Smokeless tobacco: Never Used  Vaping Use  . Vaping Use: Never used  Substance and Sexual Activity  . Alcohol use: No  . Drug use: No  . Sexual activity: Not on file  Other Topics Concern  . Not on file  Social History Narrative   Regular exercise - yes      [place] [place] [facility] (07/12/16):   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  no pets currently. No bird or mold exposure.   Social Determinants of Health   Financial Resource Strain: Not on file  Food Insecurity: Not on file  Transportation Needs: Not on file  Physical Activity: Not on file  Stress: Not on file  Social Connections: Not on file  Intimate Partner Violence: Not  on file   Family History  Problem Relation Age of Onset  . Heart disease Mother   . Diabetes Mother   . Prostate cancer Father   . Depression Other   . Diabetes Other   . Prostate cancer Other   . Colon polyps Neg Hx   . Colon cancer Neg Hx   . Rectal cancer Neg Hx   . Stomach cancer Neg Hx   . Esophageal cancer Neg Hx    ROS: All systems reviewed and negative except as per HPI.   Current Outpatient Medications  Medication Sig Dispense Refill  . acetaminophen (TYLENOL) 500 MG tablet Take 500 mg by mouth daily as needed for moderate pain or headache.      [name] aspirin EC 81 MG EC tablet Take 1 tablet (81 mg total) by mouth daily. 30 tablet   . azaTHIOprine (IMURAN) 50 MG tablet Take 100 mg by mouth daily.    . clopidogrel (PLAVIX) 75 MG tablet TAKE 1 TABLET BY MOUTH  DAILY 90 tablet 1  . dorzolamide-timolol (COSOPT) 22.3-6.8 MG/ML ophthalmic solution Place 1 drop into both eyes 2 (two) times daily.     . finasteride (PROSCAR) 5 MG tablet Take 5 mg by mouth at bedtime.     [name] levothyroxine (SYNTHROID, LEVOTHROID) 100 MCG tablet Take 100 mcg by mouth daily before breakfast.    . metoprolol succinate (TOPROL-XL) 25 MG 24 hr tablet TAKE 1 TABLET BY MOUTH  DAILY 90 tablet 2  . Multiple Vitamin (MULTIVITAMIN) tablet Take 1 tablet by mouth daily.    . nitroGLYCERIN (NITROSTAT) 0.4 MG SL tablet Place 1 tablet (0.4 mg total) under the tongue every 5 (five) minutes as needed for chest pain. 25 tablet 3  . pantoprazole (PROTONIX) 40 MG tablet TAKE 1 TABLET BY MOUTH  DAILY 90 tablet 1  . rosuvastatin (CRESTOR) 10 MG tablet Take 10 mg every morning by mouth.    . spironolactone (ALDACTONE) 25 MG tablet Take 0.5 tablets (12.5 mg total) by mouth daily. 45 tablet 3  . spironolactone (ALDACTONE) 25 MG tablet Take 0.5 tablets (12.5 mg total) by mouth daily. 7 tablet 0  . tamsulosin (FLOMAX) 0.4 MG CAPS capsule Take 0.4 mg by mouth at bedtime.    . torsemide 40 MG TABS Take 40 mg by mouth daily. 90 tablet 3  . torsemide 40 MG TABS Take 40 mg by mouth daily. 14 tablet 0   No current facility-administered medications for this encounter.   Facility-Administered Medications Ordered in Other Encounters  Medication Dose Route Frequency Provider Last Rate Last Admin  . sodium chloride flush (NS) 0.9 % injection 10 mL  10 mL Intracatheter PRN Brunetta Genera, MD   10 mL at 08/24/16 1653  . sodium chloride flush (NS) 0.9 % injection 10 mL  10 mL Intracatheter PRN Brunetta Genera, MD   10 mL at 10/07/16 1555   BP (!) 152/80   Pulse 85   Wt 104 kg (229 lb 3.2  oz)   SpO2 95%   BMI 31.09 kg/m  General: NAD Neck: JVP 8-9 cm with HJR, no thyromegaly or thyroid nodule.  Lungs: Clear to auscultation bilaterally with normal respiratory effort. CV: Nondisplaced PMI.  Heart regular S1/S2, no S3/S4, no murmur.  1+ edema to knees.  No carotid bruit.  Normal pedal pulses.  Abdomen: Soft, nontender, no hepatosplenomegaly, no distention.  Skin: Intact without lesions or rashes.  Neurologic: Alert and oriented x 3.  Psych: Normal affect. Extremities: No  clubbing or cyanosis.  HEENT: Normal.   Assessment/Plan: 1. Chronic systolic CHF: Ischemic cardiomyopathy. Echo in 10/21 with EF 35-40%, comparable to prior echo in 9/18.  CHF is complicated by CKD stage 3b which worsens fluid retention and also limits GDMT.  On exam today, he is volume overloaded though REDS clip is only borderline elevated (35%). NYHA class III symptoms.   - Stop Lasix and start torsemide 40 mg daily.  BMET today and in 10 days.  - Wear graded compression stockings with significant LE edema.  - If creatinine is stable today compared to last BMET, he can start spironolactone 12.5 mg daily.  - Continue Toprol XL 25 mg daily.  - No ARB/ARNI yet with CKD stage 3b.   - He may be candidate for SGLT2 inhibitor depending on how low his GFR runs.  - Not ICD candidate with age and EF > 35%, narrow QRS so would not benefit from CRT.  2. CAD: S/p CABG 1/02 with subsequent PCI to seq SVG-PDA/PLV and later to native LAD.  He denies chest pain.   - Continue ASA 81 daily.  - Continue Crestor 10 mg daily, check lipids with labs today.  3. CKD stage 3b: From pauci-immune vasculitis with glomerulonephritis.  - Follow BMET closely with cardiac med titration.   Work in with me in 3 wks to reassess volume and renal function.   Loralie Champagne 09/10/2020

## 2020-09-11 NOTE — Progress Notes (Signed)
HEMATOLOGY/ONCOLOGY CLINIC NOTE  Date of Service: 09/12/20  Patient Care Team: Donato Heinz, MD as PCP - General (Nephrology) [name], MD as PCP - Cardiology (Cardiology) Brunetta Genera, MD as Consulting Physician (Hematology)  CHIEF COMPLAINTS/PURPOSE OF CONSULTATION:  F/u for Diffuse large B cell lymphoma of the small intestine  HISTORY OF PRESENTING ILLNESS:   Luke Mcbride is a wonderful 85 y.o. male who has been referred to [place] by Dr .Donato Heinz, MD and Luke Po MD for evaluation and management of newly diagnosed diffuse large B-cell lymphoma of the small bowel.  Patient has a history of multiple medical comorbidities as noted below including coronary artery disease status post PCI and CABG, chronic kidney disease stage III, Barrett's esophagus, hypertension, dyslipidemia, interstitial lung disease, GERD, vasculitis involving the lungs and kidneys on chronic Imuran therapy.  He was having upper abdominal discomfort for a few months with loss of about 15-20 pounds. He had a CT scan of the abdomen done by Dr. Havery Moros on 06/15/2016 which showed a 3.3 x 3.2 x 2.4 cm indeterminate lesion involving the left upper quadrant small bowel loops. No additional small bowel lesions noted. No ascites. No retroperitoneal mesenteric or pelvic lymphadenopathy noted.  Patient was seen by Dr. Serita Grammes and underwent a diagnostic laparoscopy with small bowel resection on 07/12/2016. Pathology showed that the lesion was consistent with diffuse large B-cell lymphoma 2 cm in length and 4 cm in width centrally ulcerated mass involving 100% of the bowel circumference. The tumor was about 1 cm thick invading through the entire wall and into the underlying mesenteric fat. The tumor also abuts and focally involve the serosa. No extramural satellite lymph nodes noted. Did not seem to involve the peri-intestinal lymph nodes. Tumor was noted to have a germinal center  phenotype.  Patient is healing well from surgery. Continues to be on Imuran 100 mg by mouth daily. Has not reported any other peripheral enlarged lymph nodes. He is starting to eat better.   INTERVAL HISTORY   Luke Mcbride is here for follow-up for his Diffuse large B cell lymphoma of the small intestine. The patient's last visit with [place] was on 09/13/2019. The pt reports that he is doing well overall.  The pt reports that he had to get a Mohs procedure for a spot on his forehead. The pt notes it took four times to get this out, but this has healed well since. The pt notes no other concerns or symptoms. He has been eating well. The pt notes he was put on Torsemide within the last week. The pt notes he is still on the Imuran.  Lab results today 09/12/2020 of CBC w/diff and CMP is as follows: all values are WNL except for RBC of 3.81, Hgb of 12.6, MCV of 102.6, RDW of 16.5, Lymphs Abs of 0.4K, BUN of 49, Creatinine of 2.66, Calcium of 8.7, GFR est of 23. 09/12/2020 LDH of 200.  On review of systems, pt reports recent Mohs surgery, recurrence of skin cancer spots and denies fevers, chills, night sweats, sudden weight loss, decreased appetite, changes in bowel habits, acute leg swelling, abdominal pain, and any other symptoms.  MEDICAL HISTORY:  Past Medical History:  Diagnosis Date  . ALLERGIC RHINITIS   . Anemia    hx  . Barrett's esophagus   . BOOP (bronchiolitis obliterans with organizing pneumonia) ([place])    a. s/p R VATS 2008.  [name] CAD (coronary artery disease)  a. 05/2000: NSTEMI/CABG x 6: LIMA->LAD, VG->D1, VG->OM1->2, VG->PDA->RPL;  b. 07/2007 MV: high lat infarct, no ischemia, EF 47%;  c. Cath/PCI: LM nl, LAD20p, 38m, D1 nl, D2 60-70ost, D3 nl, LCX 70ost, 160m, OM1/OM2 min irregs, RCA 30 diff, PDA 99, LIMA->LAD atretic, VG->D1 100, VG->OM1->2 100, VG->PDA->RPL 90p (4.0x23 Vision BMS);  c. 07/2012 Echo: EF 55%, gr1 DD.  [name] Cataract   . CHF (congestive heart failure) ([place])   . CKD  (chronic kidney disease), stage III ([place])    a. renal bx 2008: GLN with vasculitis  . Diffuse large B cell lymphoma ([place]) 08/07/2016  . GERD (gastroesophageal reflux disease)   . Glaucoma    a. Cannot see out of L eye.  . Hematuria    Microscopic  . Hyperglycemia    Patient reported while on prednisone, had to take insulin  . Hyperlipemia   . Hypertension   . Hypothyroidism   . ILD (interstitial lung disease) ([place])   . Iritis   . Local infection of skin and subcutaneous tissue   . Membranoproliferative nephritis   . Myocardial infarction ([place]) 2002  . Neutropenia, drug-induced ([place])   . Recurrent boils   . Residual foreign body in soft tissue   . Vasculitis ([place])    a.  pauciimmune vasculitis with renal involvement and hx of transient hemoptysis in the past with associated BOOP, 2008 (renal bx 2008: GLN with vasculitis). b. History of treatment with 2 cycles of Cytoxan and pheresis. H/o hemoptysis and pulm hemorrhage with 2nd cycle of cytoxan.    SURGICAL HISTORY: Past Surgical History:  Procedure Laterality Date  . BOWEL RESECTION N/A 07/12/2016   Procedure: SMALL BOWEL RESECTION;  Surgeon: Rolm Bookbinder, MD;  Location: [facility];  Service: General;  Laterality: N/A;  . CATARACT EXTRACTION Bilateral   . CORONARY ARTERY BYPASS GRAFT  2002  . CORONARY STENT INTERVENTION N/A 03/31/2017   Procedure: CORONARY STENT INTERVENTION;  Surgeon: Martinique, Peter M, MD;  Location: [facility];  Service: Cardiovascular;  Laterality: N/A;  . IR FLUORO GUIDE PORT INSERTION RIGHT  08/20/2016  . IR REMOVAL TUN ACCESS W/ PORT W/O FL MOD SED  01/20/2017  . IR US GUIDE VASC ACCESS RIGHT  08/20/2016  . LAPAROSCOPY N/A 07/12/2016   Procedure: LAPAROSCOPY DIAGNOSTIC;  Surgeon: Rolm Bookbinder, MD;  Location: [facility];  Service: General;  Laterality: N/A;  . LEFT HEART CATHETERIZATION WITH CORONARY/GRAFT ANGIOGRAM N/A 07/26/2012   Procedure: LEFT HEART CATHETERIZATION WITH Beatrix Fetters;   Surgeon: Wellington Hampshire, MD;  Location: [facility] CATH [facility];  Service: Cardiovascular;  Laterality: N/A;  . LUNG BIOPSY    . PERCUTANEOUS CORONARY STENT INTERVENTION (PCI-S)  07/26/2012   Procedure: PERCUTANEOUS CORONARY STENT INTERVENTION (PCI-S);  Surgeon: Wellington Hampshire, MD;  Location: [facility] C[facility];  Service: Cardiovascular;;  . RENAL BIOPSY    . RIGHT/LEFT HEART CATH AND CORONARY/GRAFT ANGIOGRAPHY N/A 03/23/2017   Procedure: RIGHT/LEFT HEART CATH AND CORONARY/GRAFT ANGIOGRAPHY;  Surgeon: Martinique, Peter M, MD;  Location: [facility] CV LAB;  Service: Cardiovascular;  Laterality: N/A;  . TOE AMPUTATION  2009   hammer toe    SOCIAL HISTORY: Social History   Socioeconomic History  . Marital status: Married    Spouse name: Not on file  . Number of children: Not on file  . Years of education: Not on file  . Highest education level: Not on file  Occupational History  . Occupation: [employer]  . Smoking status: Never Smoker  .  Smokeless tobacco: Never Used  Vaping Use  . Vaping Use: Never used  Substance and Sexual Activity  . Alcohol use: No  . Drug use: No  . Sexual activity: Not on file  Other Topics Concern  . Not on file  Social History Narrative   Regular exercise - yes      [facility] (07/12/16):   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  no pets currently. No bird or mold exposure.   Social Determinants of Health   Financial Resource Strain: Not on file  Food Insecurity: Not on file  Transportation Needs: Not on file  Physical Activity: Not on file  Stress: Not on file  Social Connections: Not on file  Intimate Partner Violence: Not on file    FAMILY HISTORY: Family History  Problem Relation Age of Onset  . Heart disease Mother   . Diabetes Mother   . Prostate cancer Father   . Depression Other   . Diabetes Other   . Prostate cancer Other   . Colon polyps Neg Hx   . Colon cancer Neg Hx   . Rectal cancer Neg Hx   .  Stomach cancer Neg Hx   . Esophageal cancer Neg Hx     ALLERGIES:  has No Known Allergies.  MEDICATIONS:  Current Outpatient Medications  Medication Sig Dispense Refill  . acetaminophen (TYLENOL) 500 MG tablet Take 500 mg by mouth daily as needed for moderate pain or headache.     [name] aspirin EC 81 MG EC tablet Take 1 tablet (81 mg total) by mouth daily. 30 tablet   . azaTHIOprine (IMURAN) 50 MG tablet Take 100 mg by mouth daily.    . clopidogrel (PLAVIX) 75 MG tablet TAKE 1 TABLET BY MOUTH  DAILY 90 tablet 1  . dorzolamide-timolol (COSOPT) 22.3-6.8 MG/ML ophthalmic solution Place 1 drop into both eyes 2 (two) times daily.     . finasteride (PROSCAR) 5 MG tablet Take 5 mg by mouth at bedtime.     [name] levothyroxine (SYNTHROID, LEVOTHROID) 100 MCG tablet Take 100 mcg by mouth daily before breakfast.    . metoprolol succinate (TOPROL-XL) 25 MG 24 hr tablet TAKE 1 TABLET BY MOUTH  DAILY 90 tablet 2  . Multiple Vitamin (MULTIVITAMIN) tablet Take 1 tablet by mouth daily.    . nitroGLYCERIN (NITROSTAT) 0.4 MG SL tablet Place 1 tablet (0.4 mg total) under the tongue every 5 (five) minutes as needed for chest pain. 25 tablet 3  . pantoprazole (PROTONIX) 40 MG tablet TAKE 1 TABLET BY MOUTH  DAILY 90 tablet 1  . rosuvastatin (CRESTOR) 10 MG tablet Take 10 mg every morning by mouth.    . spironolactone (ALDACTONE) 25 MG tablet Take 0.5 tablets (12.5 mg total) by mouth daily. (Patient not taking: Reported on 09/12/2020) 45 tablet 3  . spironolactone (ALDACTONE) 25 MG tablet Take 0.5 tablets (12.5 mg total) by mouth daily. (Patient not taking: Reported on 09/12/2020) 7 tablet 0  . tamsulosin (FLOMAX) 0.4 MG CAPS capsule Take 0.4 mg by mouth at bedtime.    . torsemide 40 MG TABS Take 40 mg by mouth daily. 90 tablet 3  . torsemide 40 MG TABS Take 40 mg by mouth daily. 14 tablet 0   No current facility-administered medications for this visit.   Facility-Administered Medications Ordered in Other Visits   Medication Dose Route Frequency Provider Last Rate Last Admin  . sodium chloride flush (NS) 0.9 % injection 10 mL  10  mL Intracatheter PRN Brunetta Genera, MD   10 mL at 08/24/16 1653  . sodium chloride flush (NS) 0.9 % injection 10 mL  10 mL Intracatheter PRN Brunetta Genera, MD   10 mL at 10/07/16 1555    REVIEW OF SYSTEMS:  10 Point review of Systems was done is negative except as noted above.  PHYSICAL EXAMINATION: ECOG FS:2 - Symptomatic, <50% confined to bed  Vitals:   09/12/20 1042  BP: 132/73  Pulse: 86  Resp: 18  Temp: 98.2 F (36.8 C)  SpO2: 98%   Wt Readings from Last 3 Encounters:  09/12/20 229 lb 6.4 oz (104.1 kg)  09/09/20 229 lb 3.2 oz (104 kg)  08/13/20 231 lb (104.8 kg)   Body mass index is 31.11 kg/m.     GENERAL:alert, in no acute distress and comfortable SKIN: no acute rashes, no significant lesions EYES: conjunctiva are pink and non-injected, sclera anicteric OROPHARYNX: MMM, no exudates, no oropharyngeal erythema or ulceration NECK: supple, no JVD LYMPH:  no palpable lymphadenopathy in the cervical, axillary or inguinal regions LUNGS: clear to auscultation b/l with normal respiratory effort HEART: regular rate & rhythm ABDOMEN:  normoactive bowel sounds , non tender, not distended. Extremity: 1+ leg swelling b/l PSYCH: alert & oriented x 3 with fluent speech NEURO: no focal motor/sensory deficits  LABORATORY DATA:  I have reviewed the data as listed . CBC Latest Ref Rng & Units 09/12/2020 03/14/2020 09/13/2019  WBC 4.0 - 10.5 K/uL 6.2 5.3 5.7  Hemoglobin 13.0 - 17.0 g/dL 12.6(L) 12.3(L) 12.7(L)  Hematocrit 39.0 - 52.0 % 39.1 37.6(L) 39.4  Platelets 150 - 400 K/uL 161 156 147(L)   . CMP Latest Ref Rng & Units 09/12/2020 09/09/2020 04/17/2020  Glucose 70 - 99 mg/dL 91 127(H) 84  BUN 8 - 23 mg/dL 49(H) 41(H) 32(H)  Creatinine 0.61 - 1.24 mg/dL 2.66(H) 2.69(H) 2.35(H)  Sodium 135 - 145 mmol/L 138 138 141  Potassium 3.5 - 5.1 mmol/L 4.0  4.2 4.5  Chloride 98 - 111 mmol/L 100 104 103  CO2 22 - 32 mmol/L 26 25 24   Calcium 8.9 - 10.3 mg/dL 8.7(L) 9.0 8.9  Total Protein 6.5 - 8.1 g/dL 7.4 - -  Total Bilirubin 0.3 - 1.2 mg/dL 0.6 - -  Alkaline Phos 38 - 126 U/L 57 - -  AST 15 - 41 U/L 19 - -  ALT 0 - 44 U/L 13 - -    Lab Results  Component Value Date   LDH 200 (H) 09/12/2020     RADIOGRAPHIC STUDIES: I have personally reviewed the radiological images as listed and agreed with the findings in the report.  Nm Pet Image Restag (ps) Skull Base To Thigh  Result Date: 10/22/2016 CLINICAL DATA:  Subsequent treatment strategy for diffuse large B-cell non-Hodgkin's lymphoma. EXAM: NUCLEAR MEDICINE PET SKULL BASE TO THIGH TECHNIQUE: 11.15 mCi F-18 FDG was injected intravenously. Full-ring PET imaging was performed from the skull base to thigh after the radiotracer. CT data was obtained and used for attenuation correction and anatomic localization. FASTING BLOOD GLUCOSE:  Value: 102 mg/dl COMPARISON:  None. FINDINGS: NECK No hypermetabolic lymph nodes in the neck. CHEST The FDG avid right axillary lymph nodes and epicardial lymph nodes have resolved in the interval. The focal hypermetabolic activity in the left ventricular myocardium described on the previous study is unchanged. No new abnormalities in the chest. ABDOMEN/PELVIS Significant improvement in the abdomen. No definitive FDG avid disease remains in the liver or spleen. The  adenopathy in the abdomen has resolved. Minimal uptake in the normal appearing left adrenal gland is of no significance. No abnormal uptake seen in the soft tissues of the pelvis. The omental nodularity seen previously has resolved. There is increased attenuation in the fat associated with the anterior ventral hernia with low level uptake, likely inflammatory. The maximum SUV is measured on image 161 is 3.7. SKELETON There has been significant overall improvement in the bones as well. Uptake in the anterior left  acetabulum has significantly diminished with a maximum SUV of 3.4 today versus 29.4 previously. The left iliac uptake seen previously is no longer significantly FDG avid. There is mild focal uptake in the posterior left iliac bone on series 4, image 166 which was not seen previously and demonstrates a maximum SUV of 4.26 today. No other bony abnormalities are identified. IMPRESSION: 1. Significant interval improvement. No FDG avid disease remains in the soft tissues of the neck, chest, abdomen, or pelvis. Very mild uptake remains in the known left anterior acetabular lesion. Mild new uptake in the posterior left iliac bone is nonspecific but could represent mild involvement. Recommend attention on follow-up. 2. Increased attenuation and mild uptake in the fat of a lower midline ventral hernia is likely inflammatory. Recommend attention on follow-up. 3. No other interval change. Electronically Signed   By: Dorise Bullion III M.D   On: 10/22/2016 15:33   PET/CT 08/06/2016: IMPRESSION: 1. In addition to hypermetabolic nodal involvement of the right axilla, pericardial space, porta hepatis/mesenteric root, and retroperitoneum, there is extensive involvement of the liver as well as peritoneal involvement along the liver surface, splenic surface, and upper omentum. 2. Hypermetabolic bony lesions in the left pelvis compatible with malignant involvement. 3. There is a focus of hypermetabolic activity along the anterolateral wall of the left ventricle. Normally such findings turn out to simply be hypermetabolic myocardium. Strictly speaking given how focal this appears I cannot completely exclude the possibility of tumor involvement within or along the myocardium.   Electronically Signed   By: Van Clines M.D.   On: 08/06/2016 12:48   PET/CT 12/29/2016  IMPRESSION: 1. No evidence of residual or recurrent hypermetabolic lymphoma. 2.  Aortic Atherosclerosis (ICD10-I70.0). 3. Dilatation of  the infrarenal aorta and pelvic vasculature, as detailed above.   Electronically Signed   By: Abigail Miyamoto M.D.   On: 12/29/2016 15:17   ASSESSMENT & PLAN:   85 y.o.  male with multiple medical co-morbidities including cardiac co-morbidities, IPF, vascular on chronic immunosupression with    1) Stage IV Diffuse large B-cell lymphoma involving the small intestine (germinal cell phenotype) with complete thickness involvement of bowel and abutting serosa.- currently in remission.  Initial PET/CT scan showed extensive involvement with DLBCL, hypermetabolic nodal involvement of the right axilla, pericardial space, porta hepatis/mesenteric root, and retroperitoneum, there is extensive involvement of the liver as well as peritoneal involvement along the liver surface, splenic surface, and upper omentum. 2. Hypermetabolic bony lesions in the left pelvis compatible with malignant involvement. 3. There is a focus of hypermetabolic activity along the anterolateral wall of the left ventricle.   s/p 6 cycles of R-CEOP .   PET/CT scan on 10/22/2016 after 3 cycles of chemotherapy shows excellent response to treatment. PET/CT scan on 12/29/2016 after 6 cycles of treatment shows No evidence of residual or recurrent hypermetabolic lymphoma  A)Sh/oneutropenic fever and E.coli/pseudomonal bacteremia/HCAP after the first cycle of treatment.  Significant chemotherapy related thrombocytopenia - now resolved. B) h/o hospitalization for perforation diverticulitis -  no issues currently  2) Chronic kidney disease stage III baseline creatinine about 2 to 2.5. - stable today 3) Coronary disease status post PCI and CABG - had a myocardial perfusion imaging scan on 06/30/2016 that showed a moderately decreased ejection fraction of 30-44%- following with cardiology for management. Was having some SOB/DOE and had cardiac cath with PCI to mid LAD on11/15/2018. 4) History of pulmonary and renal vasculitis back on chronic  Imuran per Dr Meredeth Ide 5) Neutropenia -resolved  6) Thrombocytopenia due to chemotherapy -resolved  7) Anemia - much improved hgb is up to 12.9 8) Arthritis, especially in right knee -Week of 09/18/17 he had fluid drawn form right knee and given cortisone shot by PCP -Encouraged him to be active with walking or PT   PLAN:  -Discussed pt labwork today, 09/12/2020; blood counts stable, chemistries stable, LDH borderline elevated. -Advised pt that the Imuran can increase the risk of recurrent skin cancer and increases risk of immunosuppression. -No clinical or lab evidence of lymphoma recurrence at this time. -Advised patient that he is now nearly 4 yrs from treatmenf for his DLBCL. -Advised pt that we will continue with one more six month visit prior to switching to annual visits. -Continue f/u with Dermatologist. -Will see back in 6 months with labs.           FOLLOW UP: RTC with Dr Irene Limbo with labs in 6 months   The total time spent in the appt was 20 minutes and more than 50% was on counseling and direct patient cares.  All of the patient's questions were answered with apparent satisfaction. The patient knows to call the clinic with any problems, questions or concerns.  Sullivan Lone MD [place] AAHIVMS [facility] Hematology/Oncology Physician [facility]  (Office):       [phone] (Work cell):  [phone] (Fax):           [phone]  I, Reinaldo Raddle, am acting as scribe for Dr. Sullivan Lone, MD.     .I have reviewed the above documentation for accuracy and completeness, and I agree with the above. Brunetta Genera MD

## 2020-09-12 ENCOUNTER — Inpatient Hospital Stay (HOSPITAL_BASED_OUTPATIENT_CLINIC_OR_DEPARTMENT_OTHER): Payer: Medicare Other | Admitting: Hematology

## 2020-09-12 ENCOUNTER — Inpatient Hospital Stay: Payer: Medicare Other | Attending: Hematology

## 2020-09-12 ENCOUNTER — Other Ambulatory Visit: Payer: Self-pay

## 2020-09-12 VITALS — BP 132/73 | HR 86 | Temp 98.2°F | Resp 18 | Ht 72.0 in | Wt 229.4 lb

## 2020-09-12 DIAGNOSIS — Z8042 Family history of malignant neoplasm of prostate: Secondary | ICD-10-CM | POA: Insufficient documentation

## 2020-09-12 DIAGNOSIS — K439 Ventral hernia without obstruction or gangrene: Secondary | ICD-10-CM | POA: Insufficient documentation

## 2020-09-12 DIAGNOSIS — I700 Atherosclerosis of aorta: Secondary | ICD-10-CM | POA: Diagnosis not present

## 2020-09-12 DIAGNOSIS — R7881 Bacteremia: Secondary | ICD-10-CM | POA: Diagnosis not present

## 2020-09-12 DIAGNOSIS — Z8719 Personal history of other diseases of the digestive system: Secondary | ICD-10-CM | POA: Insufficient documentation

## 2020-09-12 DIAGNOSIS — M1711 Unilateral primary osteoarthritis, right knee: Secondary | ICD-10-CM | POA: Diagnosis not present

## 2020-09-12 DIAGNOSIS — C8338 Diffuse large B-cell lymphoma, lymph nodes of multiple sites: Secondary | ICD-10-CM

## 2020-09-12 DIAGNOSIS — Z9049 Acquired absence of other specified parts of digestive tract: Secondary | ICD-10-CM | POA: Diagnosis not present

## 2020-09-12 DIAGNOSIS — Z8249 Family history of ischemic heart disease and other diseases of the circulatory system: Secondary | ICD-10-CM | POA: Diagnosis not present

## 2020-09-12 DIAGNOSIS — E785 Hyperlipidemia, unspecified: Secondary | ICD-10-CM | POA: Diagnosis not present

## 2020-09-12 DIAGNOSIS — Z79899 Other long term (current) drug therapy: Secondary | ICD-10-CM | POA: Insufficient documentation

## 2020-09-12 DIAGNOSIS — N183 Chronic kidney disease, stage 3 unspecified: Secondary | ICD-10-CM | POA: Insufficient documentation

## 2020-09-12 DIAGNOSIS — Z833 Family history of diabetes mellitus: Secondary | ICD-10-CM | POA: Insufficient documentation

## 2020-09-12 DIAGNOSIS — I2510 Atherosclerotic heart disease of native coronary artery without angina pectoris: Secondary | ICD-10-CM | POA: Insufficient documentation

## 2020-09-12 DIAGNOSIS — D84821 Immunodeficiency due to drugs: Secondary | ICD-10-CM | POA: Insufficient documentation

## 2020-09-12 DIAGNOSIS — I129 Hypertensive chronic kidney disease with stage 1 through stage 4 chronic kidney disease, or unspecified chronic kidney disease: Secondary | ICD-10-CM | POA: Insufficient documentation

## 2020-09-12 DIAGNOSIS — D649 Anemia, unspecified: Secondary | ICD-10-CM | POA: Insufficient documentation

## 2020-09-12 DIAGNOSIS — Z818 Family history of other mental and behavioral disorders: Secondary | ICD-10-CM | POA: Insufficient documentation

## 2020-09-12 DIAGNOSIS — K219 Gastro-esophageal reflux disease without esophagitis: Secondary | ICD-10-CM | POA: Diagnosis not present

## 2020-09-12 LAB — CBC WITH DIFFERENTIAL/PLATELET
Abs Immature Granulocytes: 0.02 10*3/uL (ref 0.00–0.07)
Basophils Absolute: 0 10*3/uL (ref 0.0–0.1)
Basophils Relative: 1 %
Eosinophils Absolute: 0.1 10*3/uL (ref 0.0–0.5)
Eosinophils Relative: 1 %
HCT: 39.1 % (ref 39.0–52.0)
Hemoglobin: 12.6 g/dL — ABNORMAL LOW (ref 13.0–17.0)
Immature Granulocytes: 0 %
Lymphocytes Relative: 7 %
Lymphs Abs: 0.4 10*3/uL — ABNORMAL LOW (ref 0.7–4.0)
MCH: 33.1 pg (ref 26.0–34.0)
MCHC: 32.2 g/dL (ref 30.0–36.0)
MCV: 102.6 fL — ABNORMAL HIGH (ref 80.0–100.0)
Monocytes Absolute: 0.8 10*3/uL (ref 0.1–1.0)
Monocytes Relative: 13 %
Neutro Abs: 4.8 10*3/uL (ref 1.7–7.7)
Neutrophils Relative %: 78 %
Platelets: 161 10*3/uL (ref 150–400)
RBC: 3.81 MIL/uL — ABNORMAL LOW (ref 4.22–5.81)
RDW: 16.5 % — ABNORMAL HIGH (ref 11.5–15.5)
WBC: 6.2 10*3/uL (ref 4.0–10.5)
nRBC: 0 % (ref 0.0–0.2)

## 2020-09-12 LAB — CMP (CANCER CENTER ONLY)
ALT: 13 U/L (ref 0–44)
AST: 19 U/L (ref 15–41)
Albumin: 3.8 g/dL (ref 3.5–5.0)
Alkaline Phosphatase: 57 U/L (ref 38–126)
Anion gap: 12 (ref 5–15)
BUN: 49 mg/dL — ABNORMAL HIGH (ref 8–23)
CO2: 26 mmol/L (ref 22–32)
Calcium: 8.7 mg/dL — ABNORMAL LOW (ref 8.9–10.3)
Chloride: 100 mmol/L (ref 98–111)
Creatinine: 2.66 mg/dL — ABNORMAL HIGH (ref 0.61–1.24)
GFR, Estimated: 23 mL/min — ABNORMAL LOW (ref >60)
Glucose, Bld: 91 mg/dL (ref 70–99)
Potassium: 4 mmol/L (ref 3.5–5.1)
Sodium: 138 mmol/L (ref 135–145)
Total Bilirubin: 0.6 mg/dL (ref 0.3–1.2)
Total Protein: 7.4 g/dL (ref 6.5–8.1)

## 2020-09-12 LAB — LACTATE DEHYDROGENASE: LDH: 200 U/L — ABNORMAL HIGH (ref 98–192)

## 2020-09-19 ENCOUNTER — Ambulatory Visit (HOSPITAL_COMMUNITY)
Admission: RE | Admit: 2020-09-19 | Discharge: 2020-09-19 | Disposition: A | Discharge status: Home / Self Care | Payer: Medicare Other | Source: Ambulatory Visit | Attending: Cardiology | Admitting: Cardiology

## 2020-09-19 ENCOUNTER — Other Ambulatory Visit (HOSPITAL_COMMUNITY): Payer: Self-pay | Admitting: Cardiology

## 2020-09-19 ENCOUNTER — Other Ambulatory Visit: Payer: Self-pay

## 2020-09-19 DIAGNOSIS — I5022 Chronic systolic (congestive) heart failure: Secondary | ICD-10-CM | POA: Insufficient documentation

## 2020-09-19 LAB — BASIC METABOLIC PANEL
Anion gap: 10 (ref 5–15)
BUN: 47 mg/dL — ABNORMAL HIGH (ref 8–23)
CO2: 25 mmol/L (ref 22–32)
Calcium: 8.8 mg/dL — ABNORMAL LOW (ref 8.9–10.3)
Chloride: 104 mmol/L (ref 98–111)
Creatinine, Ser: 2.8 mg/dL — ABNORMAL HIGH (ref 0.61–1.24)
GFR, Estimated: 22 mL/min — ABNORMAL LOW (ref >60)
Glucose, Bld: 117 mg/dL — ABNORMAL HIGH (ref 70–99)
Potassium: 4.3 mmol/L (ref 3.5–5.1)
Sodium: 139 mmol/L (ref 135–145)

## 2020-09-19 MED ORDER — TORSEMIDE 40 MG PO TABS: 40.0000 mg | ORAL_TABLET | Freq: Every day | ORAL | 3 refills | Status: DC

## 2020-09-19 MED ORDER — TORSEMIDE 40 MG PO TABS: 40.0000 mg | ORAL_TABLET | Freq: Every day | ORAL | 0 refills | Status: DC

## 2020-09-19 NOTE — Telephone Encounter (Signed)
Patient reports he was unable to get torsemide through mail order pharmacy Advised original script was sent in correctly however will update script again and send supply to local pharmacy

## 2020-09-25 DIAGNOSIS — M1711 Unilateral primary osteoarthritis, right knee: Secondary | ICD-10-CM | POA: Diagnosis not present

## 2020-09-26 ENCOUNTER — Other Ambulatory Visit (HOSPITAL_COMMUNITY): Payer: Self-pay

## 2020-09-26 MED ORDER — TORSEMIDE 20 MG PO TABS: 40.0000 mg | ORAL_TABLET | Freq: Every day | ORAL | 3 refills | Status: DC

## 2020-09-30 ENCOUNTER — Ambulatory Visit (HOSPITAL_COMMUNITY)
Admission: RE | Admit: 2020-09-30 | Discharge: 2020-09-30 | Disposition: A | Discharge status: Home / Self Care | Payer: Medicare Other | Source: Ambulatory Visit | Attending: Cardiology | Admitting: Cardiology

## 2020-09-30 ENCOUNTER — Encounter (HOSPITAL_COMMUNITY): Payer: Self-pay | Admitting: Cardiology

## 2020-09-30 ENCOUNTER — Other Ambulatory Visit: Payer: Self-pay

## 2020-09-30 ENCOUNTER — Ambulatory Visit: Payer: Medicare Other | Admitting: Orthopaedic Surgery

## 2020-09-30 VITALS — BP 128/80 | HR 68 | Wt 225.8 lb

## 2020-09-30 DIAGNOSIS — Z951 Presence of aortocoronary bypass graft: Secondary | ICD-10-CM | POA: Insufficient documentation

## 2020-09-30 DIAGNOSIS — Z7982 Long term (current) use of aspirin: Secondary | ICD-10-CM | POA: Insufficient documentation

## 2020-09-30 DIAGNOSIS — E1122 Type 2 diabetes mellitus with diabetic chronic kidney disease: Secondary | ICD-10-CM | POA: Diagnosis not present

## 2020-09-30 DIAGNOSIS — Z955 Presence of coronary angioplasty implant and graft: Secondary | ICD-10-CM | POA: Insufficient documentation

## 2020-09-30 DIAGNOSIS — N1832 Chronic kidney disease, stage 3b: Secondary | ICD-10-CM | POA: Insufficient documentation

## 2020-09-30 DIAGNOSIS — Z79890 Hormone replacement therapy (postmenopausal): Secondary | ICD-10-CM | POA: Insufficient documentation

## 2020-09-30 DIAGNOSIS — I5022 Chronic systolic (congestive) heart failure: Secondary | ICD-10-CM

## 2020-09-30 DIAGNOSIS — Z8572 Personal history of non-Hodgkin lymphomas: Secondary | ICD-10-CM | POA: Diagnosis not present

## 2020-09-30 DIAGNOSIS — I255 Ischemic cardiomyopathy: Secondary | ICD-10-CM | POA: Diagnosis not present

## 2020-09-30 DIAGNOSIS — Z7902 Long term (current) use of antithrombotics/antiplatelets: Secondary | ICD-10-CM | POA: Diagnosis not present

## 2020-09-30 DIAGNOSIS — I252 Old myocardial infarction: Secondary | ICD-10-CM | POA: Insufficient documentation

## 2020-09-30 DIAGNOSIS — I130 Hypertensive heart and chronic kidney disease with heart failure and stage 1 through stage 4 chronic kidney disease, or unspecified chronic kidney disease: Secondary | ICD-10-CM | POA: Diagnosis not present

## 2020-09-30 DIAGNOSIS — I2510 Atherosclerotic heart disease of native coronary artery without angina pectoris: Secondary | ICD-10-CM | POA: Diagnosis not present

## 2020-09-30 DIAGNOSIS — K219 Gastro-esophageal reflux disease without esophagitis: Secondary | ICD-10-CM | POA: Diagnosis not present

## 2020-09-30 DIAGNOSIS — Z79899 Other long term (current) drug therapy: Secondary | ICD-10-CM | POA: Diagnosis not present

## 2020-09-30 DIAGNOSIS — Z8249 Family history of ischemic heart disease and other diseases of the circulatory system: Secondary | ICD-10-CM | POA: Insufficient documentation

## 2020-09-30 LAB — BASIC METABOLIC PANEL
Anion gap: 11 (ref 5–15)
BUN: 55 mg/dL — ABNORMAL HIGH (ref 8–23)
CO2: 25 mmol/L (ref 22–32)
Calcium: 8.7 mg/dL — ABNORMAL LOW (ref 8.9–10.3)
Chloride: 100 mmol/L (ref 98–111)
Creatinine, Ser: 2.89 mg/dL — ABNORMAL HIGH (ref 0.61–1.24)
GFR, Estimated: 21 mL/min — ABNORMAL LOW (ref >60)
Glucose, Bld: 110 mg/dL — ABNORMAL HIGH (ref 70–99)
Potassium: 4 mmol/L (ref 3.5–5.1)
Sodium: 136 mmol/L (ref 135–145)

## 2020-09-30 LAB — CBC
HCT: 40.2 % (ref 39.0–52.0)
Hemoglobin: 13.2 g/dL (ref 13.0–17.0)
MCH: 33.7 pg (ref 26.0–34.0)
MCHC: 32.8 g/dL (ref 30.0–36.0)
MCV: 102.6 fL — ABNORMAL HIGH (ref 80.0–100.0)
Platelets: 179 10*3/uL (ref 150–400)
RBC: 3.92 MIL/uL — ABNORMAL LOW (ref 4.22–5.81)
RDW: 16.5 % — ABNORMAL HIGH (ref 11.5–15.5)
WBC: 6.4 10*3/uL (ref 4.0–10.5)
nRBC: 0 % (ref 0.0–0.2)

## 2020-09-30 LAB — TSH: TSH: 1.53 u[IU]/mL (ref 0.350–4.500)

## 2020-09-30 LAB — BRAIN NATRIURETIC PEPTIDE: B Natriuretic Peptide: 321.7 pg/mL — ABNORMAL HIGH (ref 0.0–100.0)

## 2020-09-30 MED ORDER — TORSEMIDE 20 MG PO TABS: 40.0000 mg | ORAL_TABLET | Freq: Every morning | ORAL | 3 refills | Status: DC

## 2020-09-30 NOTE — Progress Notes (Signed)
Height: 6'    Weight: 225 lb 8 oz  BMI: 30.62  Today's Date: 09/30/2020  STOP BANG RISK ASSESSMENT S (snore) Have you been told that you snore?     YES   T (tired) Are you often tired, fatigued, or sleepy during the day?   YES  O (obstruction) Do you stop breathing, choke, or gasp during sleep? NO   P (pressure) Do you have or are you being treated for high blood pressure? YES   B (BMI) Is your body index greater than 35 kg/m? NO   A (age) Are you 83 years old or older? YES   N (neck) Do you have a neck circumference greater than 16 inches?      G (gender) Are you a male? YES   TOTAL STOP/BANG "YES" ANSWERS 5                                                                       For Office Use Only              Procedure Order Form    YES to 3+ Stop Bang questions OR two clinical symptoms - patient qualifies for WatchPAT (CPT 95800)      Clinical Notes: Will consult Sleep Specialist and refer for management of therapy due to patient increased risk of Sleep Apnea. Ordering a sleep study due to the following two clinical symptoms: Excessive daytime sleepiness G47.10 / Loud snoring R06.83

## 2020-09-30 NOTE — Patient Instructions (Signed)
Take Torsemide 40 mg (2 tabs) every MORNING around 8 am  Labs done today, your results will be available in MyChart, we will contact you for abnormal readings.  Your physician recommends that you schedule a follow-up appointment in: 6 weeks  If you have any questions or concerns before your next appointment please send [place] a message through [place] or call our office at [phone].    TO LEAVE A MESSAGE FOR THE NURSE SELECT OPTION 2, PLEASE LEAVE A MESSAGE INCLUDING: . YOUR NAME . DATE OF BIRTH . CALL BACK NUMBER . REASON FOR CALL**this is important as we prioritize the call backs  [place] AS LONG AS YOU CALL BEFORE 4:00 PM  At the [facility], you and your health needs are our priority. As part of our continuing mission to provide you with exceptional heart care, we have created designated Provider Care Teams. These Care Teams include your primary Cardiologist (physician) and Advanced Practice Providers (APPs- Physician Assistants and Nurse Practitioners) who all work together to provide you with the care you need, when you need it.   You may see any of the following providers on your designated Care Team at your next follow up: [name] Dr Glori Bickers . Dr Loralie Champagne . Dr Vickki Muff . Darrick Grinder, NP . Lyda Jester, [place] . Audry Riles, PharmD   Please be sure to bring in all your medications bottles to every appointment.

## 2020-10-01 NOTE — Progress Notes (Signed)
Nephrology: Donato Heinz, MD  Cardiology: Dr. [name] HF Cardiology: Dr. Aundra Dubin  85 y.o. with history of CAD, ischemic cardiomyopathy, BOOP, pauci-immune vasculitis with CKD stage 3b, and diffuse large B cell lymphoma was referred by Dr. [name] for evaluation of CHF.  Patient had CABG in 2002.  In later years, he had PCI to native LAD and to sequential SVG-PDA/PLV.  LIMA is known to be atretic and SVG-D and sequential SVG-OM1/OM2 known to be occluded.  Most recent echo in 10/21 showed EF 35-40%.  Patient was diagnosed with pauci-immune vasculitis with glomerulonephritis by biopsy in 2008.  He is followed by Dr. Marval Regal and currently is on Imuran.  CKD stage 3b currently. He has been diagnosed by BOOP and used to be followed by pulmonary (not seen recently).  He was diagnosed with diffuse large B cell lymphoma in 3/18 and had 6 inches of his colon resected.  He was treated with chemotherapy.    Medical management of his cardiomyopathy has been limited by CKD stage 3b.    He returns today for followup of CHF.  Weight is down 4 lbs with increased diuretic regimen.  Has a difficult time getting on compression stockings so not wearing. He remains short of breath walking up stairs or up a hill.  Generally ok on flat ground in terms of breathing.  Overall, feels weak.  No lightheadedness or falls. He has chronic right knee pain. No orthopnea/PND.  He is very tired during the day but also says that he is staying up all night urinating.  It turns out that he is taking his torsemide in the afternoon.    Labs (12/21): K 4.5, creatinine 2.35, pro-BNP 1426 Labs (5/22): K 4.3, creatinine 2.8 with GFR 22  PMH: 1. Type 2 diabetes 2. HTN 3. Diffuse large B cell lymphoma: Diagnosed 3/18, 6 inches of colon resected and treated with chemotherapy.  4. BOOP: Followed by pulmonary  5. GERD with Barrett's esophagus.  6. Glomerulonephritis with vasculitis: Pauci-immune vasculitis. Has had associated pulmonary  hemorrhage.  - Prior treatment with Cytoxan and plasmapheresis, currently on Imuran.  - CKD stage 3b.  7. Chronic systolic CHF: Ischemic cardiomyopathy.   - Echo (9/18): EF 35-40%.  - Echo (10/21) with EF 35-40%, mild LVH, severe LAE, normal RV 8. CAD: CABG x 6 in 1/02 with LIMA-LAD, SVG-D1, seq SVG-OM1/2, seq SVG-PDA/PLV.  - NSTEMI 3/14: BMS to seq SVG-PDA/PLV, LIMA atretic but good flow down LAD, SVG-OM1/2 and SVG-D occluded.  - Angina 11/18: PCI to 80% mid LAD stenosis, seq SVG-PDA/PLV remained patent, other grafts occluded (LIMA atretic).   Social History   Socioeconomic History  . Marital status: Married    Spouse name: Not on file  . Number of children: Not on file  . Years of education: Not on file  . Highest education level: Not on file  Occupational History  . Occupation: [employer]  . Smoking status: Never Smoker  . Smokeless tobacco: Never Used  Vaping Use  . Vaping Use: Never used  Substance and Sexual Activity  . Alcohol use: No  . Drug use: No  . Sexual activity: Not on file  Other Topics Concern  . Not on file  Social History Narrative   Regular exercise - yes      [facility] (07/12/16):   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  no pets currently. No bird or mold exposure.   Social Determinants of Radio broadcast assistant  Strain: Not on file  Food Insecurity: Not on file  Transportation Needs: Not on file  Physical Activity: Not on file  Stress: Not on file  Social Connections: Not on file  Intimate Partner Violence: Not on file   Family History  Problem Relation Age of Onset  . Heart disease Mother   . Diabetes Mother   . Prostate cancer Father   . Depression Other   . Diabetes Other   . Prostate cancer Other   . Colon polyps Neg Hx   . Colon cancer Neg Hx   . Rectal cancer Neg Hx   . Stomach cancer Neg Hx   . Esophageal cancer Neg Hx    ROS: All systems reviewed and negative except as per HPI.    Current Outpatient Medications  Medication Sig Dispense Refill  . acetaminophen (TYLENOL) 500 MG tablet Take 500 mg by mouth daily as needed for moderate pain or headache.     [name] aspirin EC 81 MG EC tablet Take 1 tablet (81 mg total) by mouth daily. 30 tablet   . azaTHIOprine (IMURAN) 50 MG tablet Take 100 mg by mouth daily.    . clopidogrel (PLAVIX) 75 MG tablet TAKE 1 TABLET BY MOUTH  DAILY 90 tablet 1  . dorzolamide-timolol (COSOPT) 22.3-6.8 MG/ML ophthalmic solution Place 1 drop into both eyes 2 (two) times daily.     . finasteride (PROSCAR) 5 MG tablet Take 5 mg by mouth at bedtime.     [name] levothyroxine (SYNTHROID, LEVOTHROID) 100 MCG tablet Take 100 mcg by mouth daily before breakfast.    . metoprolol succinate (TOPROL-XL) 25 MG 24 hr tablet TAKE 1 TABLET BY MOUTH  DAILY 90 tablet 2  . Multiple Vitamin (MULTIVITAMIN) tablet Take 1 tablet by mouth daily.    . nitroGLYCERIN (NITROSTAT) 0.4 MG SL tablet Place 1 tablet (0.4 mg total) under the tongue every 5 (five) minutes as needed for chest pain. 25 tablet 3  . pantoprazole (PROTONIX) 40 MG tablet TAKE 1 TABLET BY MOUTH  DAILY 90 tablet 1  . rosuvastatin (CRESTOR) 10 MG tablet Take 10 mg every morning by mouth.    . tamsulosin (FLOMAX) 0.4 MG CAPS capsule Take 0.4 mg by mouth at bedtime.    . torsemide (DEMADEX) 20 MG tablet Take 2 tablets (40 mg total) by mouth in the morning. 60 tablet 3   No current facility-administered medications for this encounter.   Facility-Administered Medications Ordered in Other Encounters  Medication Dose Route Frequency Provider Last Rate Last Admin  . sodium chloride flush (NS) 0.9 % injection 10 mL  10 mL Intracatheter PRN Brunetta Genera, MD   10 mL at 08/24/16 1653  . sodium chloride flush (NS) 0.9 % injection 10 mL  10 mL Intracatheter PRN Brunetta Genera, MD   10 mL at 10/07/16 1555   BP 128/80   Pulse 68   Wt 102.4 kg (225 lb 12.8 oz)   SpO2 96%   BMI 30.62 kg/m  General: NAD Neck:  No JVD, no thyromegaly or thyroid nodule.  Lungs: Clear to auscultation bilaterally with normal respiratory effort. CV: Nondisplaced PMI.  Heart regular S1/S2, no S3/S4, no murmur.  1+ ankle edema.  No carotid bruit.  Normal pedal pulses.  Abdomen: Soft, nontender, no hepatosplenomegaly, no distention.  Skin: Intact without lesions or rashes.  Neurologic: Alert and oriented x 3.  Psych: Normal affect. Extremities: No clubbing or cyanosis.  HEENT: Normal.   Assessment/Plan: 1. Chronic systolic CHF: Ischemic  cardiomyopathy. Echo in 10/21 with EF 35-40%, comparable to prior echo in 9/18.  CHF is complicated by CKD stage 3b which worsens fluid retention and also limits GDMT.  He is not significantly volume overloaded on exam today and weight is down 4 lbs. NYHA class III symptoms.   - Continue torsemide 40 mg daily, BMET/BNP today. He needs to take torsemide in the early morning when he wakes up so he is not getting up so much at night.  I think that lack of sleep is leading to a lot of his fatigue.  - He is unable to get the compression stockings on.  - Creatinine has been too high for spironolactone when last checked, will reassess for future use.  - Continue Toprol XL 25 mg daily.  - No ARB/ARNI yet with CKD stage 3b.   - He may be candidate for SGLT2 inhibitor depending on how low his GFR runs (was too low at last appt).  - Not ICD candidate with age and EF > 35%, narrow QRS so would not benefit from CRT.  2. CAD: S/p CABG 1/02 with subsequent PCI to seq SVG-PDA/PLV and later to native LAD.  He denies chest pain.   - Continue ASA 81 daily.  - Continue Crestor 10 mg daily. 3. CKD stage 3b: From pauci-immune vasculitis with glomerulonephritis.  - Follow BMET closely with cardiac med titration.  4. Fatigue/tiredness: As above, think staying up at night to urinate is playing a large role here.   - He is taking torsemide in the afternoon, needs to move it to the early morning.   - He refuses  sleep study.   - Will also check CBC and TSH.   Followup in 6 wks.   Loralie Champagne 10/01/2020

## 2020-10-07 DIAGNOSIS — R809 Proteinuria, unspecified: Secondary | ICD-10-CM | POA: Diagnosis not present

## 2020-10-07 DIAGNOSIS — E7800 Pure hypercholesterolemia, unspecified: Secondary | ICD-10-CM | POA: Diagnosis not present

## 2020-10-07 DIAGNOSIS — I5042 Chronic combined systolic (congestive) and diastolic (congestive) heart failure: Secondary | ICD-10-CM | POA: Diagnosis not present

## 2020-10-07 DIAGNOSIS — I2510 Atherosclerotic heart disease of native coronary artery without angina pectoris: Secondary | ICD-10-CM | POA: Diagnosis not present

## 2020-10-07 DIAGNOSIS — E1122 Type 2 diabetes mellitus with diabetic chronic kidney disease: Secondary | ICD-10-CM | POA: Diagnosis not present

## 2020-10-07 DIAGNOSIS — N058 Unspecified nephritic syndrome with other morphologic changes: Secondary | ICD-10-CM | POA: Diagnosis not present

## 2020-10-07 DIAGNOSIS — N179 Acute kidney failure, unspecified: Secondary | ICD-10-CM | POA: Diagnosis not present

## 2020-10-07 DIAGNOSIS — N400 Enlarged prostate without lower urinary tract symptoms: Secondary | ICD-10-CM | POA: Diagnosis not present

## 2020-10-07 DIAGNOSIS — N184 Chronic kidney disease, stage 4 (severe): Secondary | ICD-10-CM | POA: Diagnosis not present

## 2020-10-07 DIAGNOSIS — G629 Polyneuropathy, unspecified: Secondary | ICD-10-CM | POA: Diagnosis not present

## 2020-10-07 DIAGNOSIS — I129 Hypertensive chronic kidney disease with stage 1 through stage 4 chronic kidney disease, or unspecified chronic kidney disease: Secondary | ICD-10-CM | POA: Diagnosis not present

## 2020-10-07 DIAGNOSIS — N057 Unspecified nephritic syndrome with diffuse crescentic glomerulonephritis: Secondary | ICD-10-CM | POA: Diagnosis not present

## 2020-10-20 DIAGNOSIS — M1711 Unilateral primary osteoarthritis, right knee: Secondary | ICD-10-CM | POA: Diagnosis not present

## 2020-10-27 DIAGNOSIS — C4442 Squamous cell carcinoma of skin of scalp and neck: Secondary | ICD-10-CM | POA: Diagnosis not present

## 2020-10-27 DIAGNOSIS — L570 Actinic keratosis: Secondary | ICD-10-CM | POA: Diagnosis not present

## 2020-10-27 DIAGNOSIS — X32XXXD Exposure to sunlight, subsequent encounter: Secondary | ICD-10-CM | POA: Diagnosis not present

## 2020-10-27 DIAGNOSIS — L820 Inflamed seborrheic keratosis: Secondary | ICD-10-CM | POA: Diagnosis not present

## 2020-10-27 DIAGNOSIS — N184 Chronic kidney disease, stage 4 (severe): Secondary | ICD-10-CM | POA: Diagnosis not present

## 2020-10-28 DIAGNOSIS — N184 Chronic kidney disease, stage 4 (severe): Secondary | ICD-10-CM | POA: Diagnosis not present

## 2020-11-10 NOTE — Progress Notes (Signed)
Nephrology: Donato Heinz, MD  Cardiology: Dr. [name] HF Cardiology: Dr. Aundra Dubin  85 y.o. with history of CAD, ischemic cardiomyopathy, BOOP, pauci-immune vasculitis with CKD stage 3b, and diffuse large B cell lymphoma was referred by Dr. [name] for evaluation of CHF.  Patient had CABG in 2002.  In later years, he had PCI to native LAD and to sequential SVG-PDA/PLV.  LIMA is known to be atretic and SVG-D and sequential SVG-OM1/OM2 known to be occluded.  Most recent echo in 10/21 showed EF 35-40%.  Patient was diagnosed with pauci-immune vasculitis with glomerulonephritis by biopsy in 2008.  He is followed by Dr. Marval Regal and currently is on Imuran.  CKD stage 3b currently. He has been diagnosed by BOOP and used to be followed by pulmonary (not seen recently).  He was diagnosed with diffuse large B cell lymphoma in 3/18 and had 6 inches of his colon resected.  He was treated with chemotherapy.    Medical management of his cardiomyopathy has been limited by CKD stage 3b.    He returns today for followup of CHF.  Weight is down 4 lbs with increased diuretic regimen.  Has a difficult time getting on compression stockings so not wearing. He remains short of breath walking up stairs or up a hill.  Generally ok on flat ground in terms of breathing.  Overall, feels weak.  No lightheadedness or falls. He has chronic right knee pain. No orthopnea/PND.  He is very tired during the day but also says that he is staying up all night urinating.  It turns out that he is taking his torsemide in the afternoon.    Today he returns for HF follow up. Overall feeling fine. Denies increasing SOB, CP, dizziness, edema, or PND/Orthopnea. Appetite ok. No fever or chills. He does not weigh at home. Taking all medications. Has not need to take extra torsemide.  REDs: 33%  Labs (12/21): K 4.5, creatinine 2.35, pro-BNP 1426 Labs (5/22): K 4.3, creatinine 2.8 with GFR 22  PMH: 1. Type 2 diabetes 2. HTN 3. Diffuse large B  cell lymphoma: Diagnosed 3/18, 6 inches of colon resected and treated with chemotherapy.  4. BOOP: Followed by pulmonary  5. GERD with Barrett's esophagus.  6. Glomerulonephritis with vasculitis: Pauci-immune vasculitis. Has had associated pulmonary hemorrhage.  - Prior treatment with Cytoxan and plasmapheresis, currently on Imuran.  - CKD stage 3b.  7. Chronic systolic CHF: Ischemic cardiomyopathy.   - Echo (9/18): EF 35-40%.  - Echo (10/21) with EF 35-40%, mild LVH, severe LAE, normal RV 8. CAD: CABG x 6 in 1/02 with LIMA-LAD, SVG-D1, seq SVG-OM1/2, seq SVG-PDA/PLV.  - NSTEMI 3/14: BMS to seq SVG-PDA/PLV, LIMA atretic but good flow down LAD, SVG-OM1/2 and SVG-D occluded.  - Angina 11/18: PCI to 80% mid LAD stenosis, seq SVG-PDA/PLV remained patent, other grafts occluded (LIMA atretic).   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  no pets currently. No bird or mold exposure.   Social Determinants of Health   Financial Resource Strain: Not on file  Food Insecurity: Not on file  Transportation Needs: Not on file  Physical Activity: Not on file  Stress: Not on file  Social Connections: Not on file  Intimate Partner Violence: Not on file   Family History  Problem Relation Age of Onset   Heart disease Mother    Diabetes Mother    Prostate cancer Father    Depression Other    Diabetes Other     Prostate cancer Other    Colon polyps Neg Hx    Colon cancer Neg Hx    Rectal cancer Neg Hx    Stomach cancer Neg Hx    Esophageal cancer Neg Hx    ROS: All systems reviewed and negative except as per HPI.   Current Outpatient Medications  Medication Sig Dispense Refill   acetaminophen (TYLENOL) 500 MG tablet Take 500 mg by mouth daily as needed for moderate pain or headache.      aspirin EC 81 MG EC tablet Take 1 tablet (81 mg total) by mouth daily. 30 tablet    azaTHIOprine (IMURAN) 50 MG tablet Take 100 mg by mouth daily.     clopidogrel (PLAVIX) 75 MG tablet TAKE 1 TABLET BY MOUTH  DAILY 90 tablet 1   dorzolamide-timolol (COSOPT) 22.3-6.8 MG/ML ophthalmic solution Place 1 drop into both eyes 2 (two) times daily.      finasteride (PROSCAR) 5 MG tablet Take 5 mg by mouth at bedtime.      levothyroxine (SYNTHROID, LEVOTHROID) 100 MCG tablet Take 100 mcg by mouth daily before breakfast.     metoprolol succinate (TOPROL-XL) 25 MG 24 hr tablet TAKE 1 TABLET BY MOUTH  DAILY 90 tablet 2   Multiple Vitamin (MULTIVITAMIN) tablet Take 1 tablet by mouth daily.     nitroGLYCERIN (NITROSTAT) 0.4 MG SL tablet Place 1 tablet (0.4 mg total) under the tongue every 5 (five) minutes as needed for chest pain. 25 tablet 3   pantoprazole (PROTONIX) 40 MG tablet TAKE 1 TABLET BY MOUTH  DAILY 90 tablet 1   rosuvastatin (CRESTOR) 10 MG tablet Take 10 mg every morning by mouth.     tamsulosin (FLOMAX) 0.4 MG CAPS capsule Take 0.4 mg by mouth at bedtime.     torsemide (DEMADEX) 20 MG tablet Take 2 tablets (40 mg total) by mouth in the morning. 60 tablet 3   No current facility-administered medications for this encounter.   Facility-Administered Medications Ordered in Other Encounters  Medication Dose Route Frequency Provider Last Rate Last Admin   sodium chloride flush (NS) 0.9 % injection 10 mL  10 mL Intracatheter PRN Brunetta Genera, MD   10 mL at 08/24/16 1653   sodium chloride flush (NS) 0.9 %  injection 10 mL  10 mL Intracatheter PRN Brunetta Genera, MD   10 mL at 10/07/16 1555   Wt Readings from Last 3 Encounters:  11/11/20 101.7 kg (224 lb 3.2 oz)  09/30/20 102.4 kg (225 lb 12.8 oz)  09/12/20 104.1 kg (229 lb 6.4 oz)   BP 140/78   Pulse 88   Wt 101.7 kg (224 lb 3.2 oz)   SpO2 96%   BMI 30.41 kg/m  Physical Exam: General:  NAD. No resp difficulty, chronically ill appearing  HEENT: Normal Neck: Supple. No JVD. Carotids 2+ bilat; no bruits. No lymphadenopathy or thryomegaly appreciated. Cor: PMI nondisplaced. Regular rate & rhythm. No rubs, gallops or murmurs. Lungs: Clear Abdomen: Obese, soft, nontender, nondistended. No hepatosplenomegaly. No bruits or masses. Good bowel sounds. Extremities: No cyanosis, clubbing, rash, 1+ LE edema Neuro: Alert & oriented x 3, cranial nerves grossly intact. Moves all 4 extremities w/o difficulty. Affect pleasant.  Assessment/Plan: 1. Chronic systolic CHF: Ischemic cardiomyopathy. Echo in 10/21 with EF 35-40%, comparable to prior echo in 9/18.  CHF is complicated by CKD stage 3b which worsens fluid retention and also limits GDMT.  He is not significantly volume overloaded on exam today, Reds 33%. NYHA class II-early III symptoms.   - Continue torsemide 40 mg daily, BMET/BNP today. He needs to take torsemide in the early morning when he wakes up so he is not getting up so much at night.  I think that lack of sleep is leading to a lot of his fatigue.  - He is unable to get the compression stockings on. Will order slippy gator to assist with donning compression socks. - Creatinine has been too high for spironolactone when last checked, will reassess for future use.  - Continue Toprol XL 25 mg daily.  - No ARB/ARNI yet with CKD stage 3b.   - He may be candidate for SGLT2 inhibitor depending on how low his GFR runs (was too low at last appt).  - Not ICD candidate with age and EF > 35%, narrow QRS so would not benefit from CRT.  2. CAD: S/p  CABG 1/02 with subsequent PCI to seq SVG-PDA/PLV and later to native LAD.  He denies chest pain.   - Continue ASA 81 daily.  - Continue Crestor 10 mg daily. 3. CKD stage 3b: From pauci-immune vasculitis with glomerulonephritis.  - Follow BMET closely with cardiac med titration.  4. Fatigue/tiredness: Resolved after changing torsemide to AM.   - He refuses sleep study.   - TSH & CBC (5/22) ok.  He had labs drawn at Nephrology several days ago, will request BMET from their office.  Followup in 6-8 weeks.  [place] FNP 11/11/2020

## 2020-11-11 ENCOUNTER — Encounter (HOSPITAL_COMMUNITY): Payer: Self-pay

## 2020-11-11 ENCOUNTER — Ambulatory Visit (HOSPITAL_COMMUNITY)
Admission: RE | Admit: 2020-11-11 | Discharge: 2020-11-11 | Disposition: A | Discharge status: Home / Self Care | Payer: Medicare Other | Source: Ambulatory Visit | Attending: Family Medicine | Admitting: Family Medicine

## 2020-11-11 ENCOUNTER — Other Ambulatory Visit: Payer: Self-pay

## 2020-11-11 VITALS — BP 140/78 | HR 88 | Wt 224.2 lb

## 2020-11-11 DIAGNOSIS — E1122 Type 2 diabetes mellitus with diabetic chronic kidney disease: Secondary | ICD-10-CM | POA: Diagnosis not present

## 2020-11-11 DIAGNOSIS — Z7902 Long term (current) use of antithrombotics/antiplatelets: Secondary | ICD-10-CM | POA: Insufficient documentation

## 2020-11-11 DIAGNOSIS — I255 Ischemic cardiomyopathy: Secondary | ICD-10-CM | POA: Diagnosis not present

## 2020-11-11 DIAGNOSIS — I5022 Chronic systolic (congestive) heart failure: Secondary | ICD-10-CM | POA: Diagnosis not present

## 2020-11-11 DIAGNOSIS — Z7982 Long term (current) use of aspirin: Secondary | ICD-10-CM | POA: Insufficient documentation

## 2020-11-11 DIAGNOSIS — I25118 Atherosclerotic heart disease of native coronary artery with other forms of angina pectoris: Secondary | ICD-10-CM

## 2020-11-11 DIAGNOSIS — R5383 Other fatigue: Secondary | ICD-10-CM

## 2020-11-11 DIAGNOSIS — Z9221 Personal history of antineoplastic chemotherapy: Secondary | ICD-10-CM | POA: Diagnosis not present

## 2020-11-11 DIAGNOSIS — I130 Hypertensive heart and chronic kidney disease with heart failure and stage 1 through stage 4 chronic kidney disease, or unspecified chronic kidney disease: Secondary | ICD-10-CM | POA: Diagnosis not present

## 2020-11-11 DIAGNOSIS — Z8249 Family history of ischemic heart disease and other diseases of the circulatory system: Secondary | ICD-10-CM | POA: Diagnosis not present

## 2020-11-11 DIAGNOSIS — I252 Old myocardial infarction: Secondary | ICD-10-CM | POA: Insufficient documentation

## 2020-11-11 DIAGNOSIS — Z951 Presence of aortocoronary bypass graft: Secondary | ICD-10-CM | POA: Diagnosis not present

## 2020-11-11 DIAGNOSIS — Z955 Presence of coronary angioplasty implant and graft: Secondary | ICD-10-CM | POA: Diagnosis not present

## 2020-11-11 DIAGNOSIS — N1832 Chronic kidney disease, stage 3b: Secondary | ICD-10-CM | POA: Diagnosis not present

## 2020-11-11 DIAGNOSIS — Z833 Family history of diabetes mellitus: Secondary | ICD-10-CM | POA: Insufficient documentation

## 2020-11-11 DIAGNOSIS — Z8572 Personal history of non-Hodgkin lymphomas: Secondary | ICD-10-CM | POA: Diagnosis not present

## 2020-11-11 DIAGNOSIS — Z79899 Other long term (current) drug therapy: Secondary | ICD-10-CM | POA: Insufficient documentation

## 2020-11-11 DIAGNOSIS — I2510 Atherosclerotic heart disease of native coronary artery without angina pectoris: Secondary | ICD-10-CM | POA: Insufficient documentation

## 2020-11-11 NOTE — Progress Notes (Signed)
ReDS Vest / Clip - 11/11/20 1200       ReDS Vest / Clip   Station Marker C    Ruler Value 31    ReDS Value Range Low volume    ReDS Actual Value 33

## 2020-11-11 NOTE — Patient Instructions (Signed)
It was great to see you today! No medication changes are needed at this time.  Your physician recommends that you schedule a follow-up appointment in: 6-8 weeks  Do the following things EVERYDAY: Weigh yourself in the morning before breakfast. Write it down and keep it in a log. Take your medicines as prescribed Eat low salt foods--Limit salt (sodium) to 2000 mg per day.  Stay as active as you can everyday Limit all fluids for the day to less than 2 liters  At the [facility], you and your health needs are our priority. As part of our continuing mission to provide you with exceptional heart care, we have created designated Provider Care Teams. These Care Teams include your primary Cardiologist (physician) and Advanced Practice Providers (APPs- Physician Assistants and Nurse Practitioners) who all work together to provide you with the care you need, when you need it.   You may see any of the following providers on your designated Care Team at your next follow up: Dr Glori Bickers Dr Loralie Champagne Dr Patrice Paradise, NP Lyda Jester, [place] Ginnie Smart Audry Riles, PharmD   Please be sure to bring in all your medications bottles to every appointment.   If you have any questions or concerns before your next appointment please send [place] a message through [place] or call our office at [phone].    TO LEAVE A MESSAGE FOR THE NURSE SELECT OPTION 2, PLEASE LEAVE A MESSAGE INCLUDING: YOUR NAME DATE OF BIRTH CALL BACK NUMBER REASON FOR CALL**this is important as we prioritize the call backs  YOU WILL RECEIVE A CALL BACK THE SAME DAY AS LONG AS YOU CALL BEFORE 4:00 PM

## 2020-11-12 ENCOUNTER — Other Ambulatory Visit (HOSPITAL_COMMUNITY): Payer: Self-pay | Admitting: Cardiology

## 2020-11-14 ENCOUNTER — Encounter: Payer: Self-pay | Admitting: Hematology

## 2020-11-25 DIAGNOSIS — Z85828 Personal history of other malignant neoplasm of skin: Secondary | ICD-10-CM | POA: Diagnosis not present

## 2020-11-25 DIAGNOSIS — X32XXXD Exposure to sunlight, subsequent encounter: Secondary | ICD-10-CM | POA: Diagnosis not present

## 2020-11-25 DIAGNOSIS — Z08 Encounter for follow-up examination after completed treatment for malignant neoplasm: Secondary | ICD-10-CM | POA: Diagnosis not present

## 2020-11-25 DIAGNOSIS — L570 Actinic keratosis: Secondary | ICD-10-CM | POA: Diagnosis not present

## 2020-11-25 DIAGNOSIS — C4441 Basal cell carcinoma of skin of scalp and neck: Secondary | ICD-10-CM | POA: Diagnosis not present

## 2020-12-02 DIAGNOSIS — H401211 Low-tension glaucoma, right eye, mild stage: Secondary | ICD-10-CM | POA: Diagnosis not present

## 2020-12-02 DIAGNOSIS — H44512 Absolute glaucoma, left eye: Secondary | ICD-10-CM | POA: Diagnosis not present

## 2020-12-29 ENCOUNTER — Encounter: Payer: Self-pay | Admitting: Cardiovascular Disease

## 2020-12-29 DIAGNOSIS — N184 Chronic kidney disease, stage 4 (severe): Secondary | ICD-10-CM | POA: Diagnosis not present

## 2020-12-30 DIAGNOSIS — N184 Chronic kidney disease, stage 4 (severe): Secondary | ICD-10-CM | POA: Diagnosis not present

## 2021-01-06 DIAGNOSIS — N058 Unspecified nephritic syndrome with other morphologic changes: Secondary | ICD-10-CM | POA: Diagnosis not present

## 2021-01-06 DIAGNOSIS — E1122 Type 2 diabetes mellitus with diabetic chronic kidney disease: Secondary | ICD-10-CM | POA: Diagnosis not present

## 2021-01-06 DIAGNOSIS — N057 Unspecified nephritic syndrome with diffuse crescentic glomerulonephritis: Secondary | ICD-10-CM | POA: Diagnosis not present

## 2021-01-06 DIAGNOSIS — R809 Proteinuria, unspecified: Secondary | ICD-10-CM | POA: Diagnosis not present

## 2021-01-06 DIAGNOSIS — N179 Acute kidney failure, unspecified: Secondary | ICD-10-CM | POA: Diagnosis not present

## 2021-01-06 DIAGNOSIS — I2510 Atherosclerotic heart disease of native coronary artery without angina pectoris: Secondary | ICD-10-CM | POA: Diagnosis not present

## 2021-01-06 DIAGNOSIS — G629 Polyneuropathy, unspecified: Secondary | ICD-10-CM | POA: Diagnosis not present

## 2021-01-06 DIAGNOSIS — I5042 Chronic combined systolic (congestive) and diastolic (congestive) heart failure: Secondary | ICD-10-CM | POA: Diagnosis not present

## 2021-01-06 DIAGNOSIS — E7800 Pure hypercholesterolemia, unspecified: Secondary | ICD-10-CM | POA: Diagnosis not present

## 2021-01-06 DIAGNOSIS — N400 Enlarged prostate without lower urinary tract symptoms: Secondary | ICD-10-CM | POA: Diagnosis not present

## 2021-01-06 DIAGNOSIS — I129 Hypertensive chronic kidney disease with stage 1 through stage 4 chronic kidney disease, or unspecified chronic kidney disease: Secondary | ICD-10-CM | POA: Diagnosis not present

## 2021-01-06 DIAGNOSIS — N184 Chronic kidney disease, stage 4 (severe): Secondary | ICD-10-CM | POA: Diagnosis not present

## 2021-01-13 ENCOUNTER — Encounter (HOSPITAL_COMMUNITY): Payer: Self-pay | Admitting: *Deleted

## 2021-01-13 ENCOUNTER — Other Ambulatory Visit: Payer: Self-pay

## 2021-01-13 ENCOUNTER — Other Ambulatory Visit (HOSPITAL_COMMUNITY): Payer: Self-pay

## 2021-01-13 ENCOUNTER — Encounter (HOSPITAL_COMMUNITY): Payer: Self-pay | Admitting: Cardiology

## 2021-01-13 ENCOUNTER — Encounter: Payer: Self-pay | Admitting: Hematology

## 2021-01-13 ENCOUNTER — Ambulatory Visit (HOSPITAL_COMMUNITY)
Admission: RE | Admit: 2021-01-13 | Discharge: 2021-01-13 | Disposition: A | Discharge status: Home / Self Care | Payer: Medicare Other | Source: Ambulatory Visit | Attending: Cardiology | Admitting: Cardiology

## 2021-01-13 VITALS — BP 142/80 | HR 87 | Wt 218.6 lb

## 2021-01-13 DIAGNOSIS — I493 Ventricular premature depolarization: Secondary | ICD-10-CM | POA: Diagnosis not present

## 2021-01-13 DIAGNOSIS — Z7984 Long term (current) use of oral hypoglycemic drugs: Secondary | ICD-10-CM | POA: Diagnosis not present

## 2021-01-13 DIAGNOSIS — Z8249 Family history of ischemic heart disease and other diseases of the circulatory system: Secondary | ICD-10-CM | POA: Diagnosis not present

## 2021-01-13 DIAGNOSIS — Z7982 Long term (current) use of aspirin: Secondary | ICD-10-CM | POA: Diagnosis not present

## 2021-01-13 DIAGNOSIS — E1122 Type 2 diabetes mellitus with diabetic chronic kidney disease: Secondary | ICD-10-CM | POA: Insufficient documentation

## 2021-01-13 DIAGNOSIS — E7800 Pure hypercholesterolemia, unspecified: Secondary | ICD-10-CM | POA: Diagnosis not present

## 2021-01-13 DIAGNOSIS — Z7902 Long term (current) use of antithrombotics/antiplatelets: Secondary | ICD-10-CM | POA: Insufficient documentation

## 2021-01-13 DIAGNOSIS — Z951 Presence of aortocoronary bypass graft: Secondary | ICD-10-CM | POA: Diagnosis not present

## 2021-01-13 DIAGNOSIS — I2510 Atherosclerotic heart disease of native coronary artery without angina pectoris: Secondary | ICD-10-CM | POA: Insufficient documentation

## 2021-01-13 DIAGNOSIS — Z79899 Other long term (current) drug therapy: Secondary | ICD-10-CM | POA: Insufficient documentation

## 2021-01-13 DIAGNOSIS — N1832 Chronic kidney disease, stage 3b: Secondary | ICD-10-CM | POA: Insufficient documentation

## 2021-01-13 DIAGNOSIS — I130 Hypertensive heart and chronic kidney disease with heart failure and stage 1 through stage 4 chronic kidney disease, or unspecified chronic kidney disease: Secondary | ICD-10-CM | POA: Insufficient documentation

## 2021-01-13 DIAGNOSIS — I255 Ischemic cardiomyopathy: Secondary | ICD-10-CM | POA: Insufficient documentation

## 2021-01-13 DIAGNOSIS — I5022 Chronic systolic (congestive) heart failure: Secondary | ICD-10-CM

## 2021-01-13 DIAGNOSIS — Z8572 Personal history of non-Hodgkin lymphomas: Secondary | ICD-10-CM | POA: Diagnosis not present

## 2021-01-13 DIAGNOSIS — I252 Old myocardial infarction: Secondary | ICD-10-CM | POA: Diagnosis not present

## 2021-01-13 DIAGNOSIS — Z833 Family history of diabetes mellitus: Secondary | ICD-10-CM | POA: Diagnosis not present

## 2021-01-13 DIAGNOSIS — Z955 Presence of coronary angioplasty implant and graft: Secondary | ICD-10-CM | POA: Diagnosis not present

## 2021-01-13 LAB — BASIC METABOLIC PANEL
Anion gap: 9 (ref 5–15)
BUN: 44 mg/dL — ABNORMAL HIGH (ref 8–23)
CO2: 24 mmol/L (ref 22–32)
Calcium: 8.8 mg/dL — ABNORMAL LOW (ref 8.9–10.3)
Chloride: 106 mmol/L (ref 98–111)
Creatinine, Ser: 2.6 mg/dL — ABNORMAL HIGH (ref 0.61–1.24)
GFR, Estimated: 24 mL/min — ABNORMAL LOW (ref >60)
Glucose, Bld: 106 mg/dL — ABNORMAL HIGH (ref 70–99)
Potassium: 4 mmol/L (ref 3.5–5.1)
Sodium: 139 mmol/L (ref 135–145)

## 2021-01-13 LAB — LIPID PANEL
Cholesterol: 160 mg/dL (ref 0–200)
HDL: 37 mg/dL — ABNORMAL LOW (ref >40)
LDL Cholesterol: 78 mg/dL (ref 0–99)
Total CHOL/HDL Ratio: 4.3 RATIO
Triglycerides: 226 mg/dL — ABNORMAL HIGH (ref <150)
VLDL: 45 mg/dL — ABNORMAL HIGH (ref 0–40)

## 2021-01-13 LAB — BRAIN NATRIURETIC PEPTIDE: B Natriuretic Peptide: 268.3 pg/mL — ABNORMAL HIGH (ref 0.0–100.0)

## 2021-01-13 MED ORDER — EMPAGLIFLOZIN 10 MG PO TABS: 10.0000 mg | ORAL_TABLET | Freq: Every day | ORAL | 11 refills | Status: DC

## 2021-01-13 MED ORDER — EMPAGLIFLOZIN 10 MG PO TABS: 10.0000 mg | ORAL_TABLET | Freq: Every day | ORAL | 0 refills | Status: DC

## 2021-01-13 MED ORDER — METOPROLOL SUCCINATE ER 50 MG PO TB24: 50.0000 mg | ORAL_TABLET | Freq: Every day | ORAL | 3 refills | Status: DC

## 2021-01-13 NOTE — Patient Instructions (Addendum)
EKG done today.  Labs done today. We will contact you only if your labs are abnormal.  START Jardiance 10mg  (1 tablet) by mouth daily.   INCREASE Metoprolol to 50mg  (1 tablet) by mouth daily.   No other medication changes were made. Please continue all current medications as prescribed.  Your physician recommends that you schedule a follow-up appointment in: 10 days for a lab only appointment and in 6 weeks with our [facility] here in our office.   If you have any questions or concerns before your next appointment please send [place] a message through [place] or call our office at [phone].    TO LEAVE A MESSAGE FOR THE NURSE SELECT OPTION 2, PLEASE LEAVE A MESSAGE INCLUDING: YOUR NAME DATE OF BIRTH CALL BACK NUMBER REASON FOR CALL**this is important as we prioritize the call backs  YOU WILL RECEIVE A CALL BACK THE SAME DAY AS LONG AS YOU CALL BEFORE 4:00 PM   Do the following things EVERYDAY: Weigh yourself in the morning before breakfast. Write it down and keep it in a log. Take your medicines as prescribed Eat low salt foods--Limit salt (sodium) to 2000 mg per day.  Stay as active as you can everyday Limit all fluids for the day to less than 2 liters   At the [facility], you and your health needs are our priority. As part of our continuing mission to provide you with exceptional heart care, we have created designated Provider Care Teams. These Care Teams include your primary Cardiologist (physician) and Advanced Practice Providers (APPs- Physician Assistants and Nurse Practitioners) who all work together to provide you with the care you need, when you need it.   You may see any of the following providers on your designated Care Team at your next follow up: Dr Glori Bickers Dr Haynes Kerns, NP Lyda Jester, [place] Audry Riles, PharmD   Please be sure to bring in all your medications bottles to every appointment.

## 2021-01-13 NOTE — Progress Notes (Signed)
Nephrology: Luke Heinz, MD  Cardiology: Dr. [name] HF Cardiology: Dr. Aundra Dubin  85 y.o. with history of CAD, ischemic cardiomyopathy, BOOP, pauci-immune vasculitis with CKD stage 3b, and diffuse large B cell lymphoma was referred by Dr. [name] for evaluation of CHF.  Patient had CABG in 2002.  In later years, he had PCI to native LAD and to sequential SVG-PDA/PLV.  LIMA is known to be atretic and SVG-D and sequential SVG-OM1/OM2 known to be occluded.  Most recent echo in 10/21 showed EF 35-40%.  Patient was diagnosed with pauci-immune vasculitis with glomerulonephritis by biopsy in 2008.  He is followed by Dr. Marval Regal and currently is on Imuran.  CKD stage 3b currently. He has been diagnosed by BOOP and used to be followed by pulmonary (not seen recently).  He was diagnosed with diffuse large B cell lymphoma in 3/18 and had 6 inches of his colon resected.  He was treated with chemotherapy.    Medical management of his cardiomyopathy has been limited by CKD stage 3b.    He returns today for followup of CHF.  Weight is down 6 lbs.  He is sleeping better now that he is taking torsemide in the morning rather than at night.  He is short of breath after walking about 50 feet or walking up stairs.  No chest pain.  No lightheadedness.  BP is mildly elevated today.     Labs (12/21): K 4.5, creatinine 2.35, pro-BNP 1426 Labs (5/22): K 4.3, creatinine 2.8 with GFR 22 Labs (6/22): K 4.3, creatinine 2.65 with GFR 23  ECG (personally reviewed): NSR, PVC, nonspecific ST-T changes  PMH: 1. Type 2 diabetes 2. HTN 3. Diffuse large B cell lymphoma: Diagnosed 3/18, 6 inches of colon resected and treated with chemotherapy.  4. BOOP: Followed by pulmonary  5. GERD with Barrett's esophagus.  6. Glomerulonephritis with vasculitis: Pauci-immune vasculitis. Has had associated pulmonary hemorrhage.  - Prior treatment with Cytoxan and plasmapheresis, currently on Imuran.  - CKD stage 3b.  7. Chronic systolic  CHF: Ischemic cardiomyopathy.   - Echo (9/18): EF 35-40%.  - Echo (10/21) with EF 35-40%, mild LVH, severe LAE, normal RV 8. CAD: CABG x 6 in 1/02 with LIMA-LAD, SVG-D1, seq SVG-OM1/2, seq SVG-PDA/PLV.  - NSTEMI 3/14: BMS to seq SVG-PDA/PLV, LIMA atretic but good flow down LAD, SVG-OM1/2 and SVG-D occluded.  - Angina 11/18: PCI to 80% mid LAD stenosis, seq SVG-PDA/PLV remained patent, other grafts occluded (LIMA atretic).   Social History   Socioeconomic History   Marital status: Married    Spouse name: Not on file   Number of children: Not on file   Years of education: Not on file   Highest education level: Not on file  Occupational History   Occupation: [employer]  Tobacco Use   Smoking status: Never   Smokeless tobacco: Never  Vaping Use   Vaping Use: Never used  Substance and Sexual Activity   Alcohol use: No   Drug use: No   Sexual activity: Not on file  Other Topics Concern   Not on file  Social History Narrative   Regular exercise - yes      [facility] (07/12/16):   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  no pets currently. No bird or mold exposure.   Social Determinants of Health   Financial Resource Strain: Not on file  Food Insecurity: Not on file  Transportation Needs: Not on file  Physical Activity: Not on file  Stress:  Not on file  Social Connections: Not on file  Intimate Partner Violence: Not on file   Family History  Problem Relation Age of Onset   Heart disease Mother    Diabetes Mother    Prostate cancer Father    Depression Other    Diabetes Other    Prostate cancer Other    Colon polyps Neg Hx    Colon cancer Neg Hx    Rectal cancer Neg Hx    Stomach cancer Neg Hx    Esophageal cancer Neg Hx    ROS: All systems reviewed and negative except as per HPI.   Current Outpatient Medications  Medication Sig Dispense Refill   acetaminophen (TYLENOL) 500 MG tablet Take 500 mg by mouth daily as needed for moderate pain  or headache.      aspirin EC 81 MG EC tablet Take 1 tablet (81 mg total) by mouth daily. 30 tablet    azaTHIOprine (IMURAN) 50 MG tablet Take 100 mg by mouth daily.     clopidogrel (PLAVIX) 75 MG tablet TAKE 1 TABLET BY MOUTH  DAILY 90 tablet 1   dorzolamide-timolol (COSOPT) 22.3-6.8 MG/ML ophthalmic solution Place 1 drop into both eyes 2 (two) times daily.      empagliflozin (JARDIANCE) 10 MG TABS tablet Take 1 tablet (10 mg total) by mouth daily before breakfast. 30 tablet 11   finasteride (PROSCAR) 5 MG tablet Take 5 mg by mouth at bedtime.      levothyroxine (SYNTHROID, LEVOTHROID) 100 MCG tablet Take 100 mcg by mouth daily before breakfast.     Multiple Vitamin (MULTIVITAMIN) tablet Take 1 tablet by mouth daily.     nitroGLYCERIN (NITROSTAT) 0.4 MG SL tablet Place 1 tablet (0.4 mg total) under the tongue every 5 (five) minutes as needed for chest pain. 25 tablet 3   pantoprazole (PROTONIX) 40 MG tablet TAKE 1 TABLET BY MOUTH  DAILY 90 tablet 1   rosuvastatin (CRESTOR) 10 MG tablet Take 10 mg every morning by mouth.     tamsulosin (FLOMAX) 0.4 MG CAPS capsule Take 0.4 mg by mouth at bedtime.     torsemide (DEMADEX) 20 MG tablet Take 2 tablets (40 mg total) by mouth in the morning. 60 tablet 3   empagliflozin (JARDIANCE) 10 MG TABS tablet Take 1 tablet (10 mg total) by mouth daily before breakfast. 30 tablet 0   metoprolol succinate (TOPROL-XL) 50 MG 24 hr tablet Take 1 tablet (50 mg total) by mouth daily. 90 tablet 3   No current facility-administered medications for this encounter.   Facility-Administered Medications Ordered in Other Encounters  Medication Dose Route Frequency Provider Last Rate Last Admin   sodium chloride flush (NS) 0.9 % injection 10 mL  10 mL Intracatheter PRN Brunetta Genera, MD   10 mL at 08/24/16 1653   sodium chloride flush (NS) 0.9 % injection 10 mL  10 mL Intracatheter PRN Brunetta Genera, MD   10 mL at 10/07/16 1555   BP (!) 142/80   Pulse 87   Wt  99.2 kg (218 lb 9.6 oz)   SpO2 96%   BMI 29.65 kg/m  General: NAD Neck: JVP 7-8 cm, no thyromegaly or thyroid nodule.  Lungs: Clear to auscultation bilaterally with normal respiratory effort. CV: Nondisplaced PMI.  Heart regular S1/S2, no S3/S4, no murmur.  1+ ankle edema.  No carotid bruit.  Normal pedal pulses.  Abdomen: Soft, nontender, no hepatosplenomegaly, no distention.  Skin: Intact without lesions or rashes.  Neurologic: Alert  and oriented x 3.  Psych: Normal affect. Extremities: No clubbing or cyanosis.  HEENT: Normal.   Assessment/Plan: 1. Chronic systolic CHF: Ischemic cardiomyopathy. Echo in 10/21 with EF 35-40%, comparable to prior echo in 9/18.  CHF is complicated by CKD stage 3b which worsens fluid retention and also limits GDMT.  He is not significantly volume overloaded on exam today and weight is down 6 lbs. NYHA class III symptoms.   - Continue torsemide 40 mg daily, BMET/BNP today.  - Start Jardiance 10 mg daily, GFR ok for this on last BMET. BMET 10 days.  - Creatinine has been too high for spironolactone when last checked, will reassess for future use.  - Increase Toprol XL to 50 mg daily.  - No ARB/ARNI yet with CKD stage 3b.   - Not ICD candidate with age and EF > 35%, narrow QRS so would not benefit from CRT.  2. CAD: S/p CABG 1/02 with subsequent PCI to seq SVG-PDA/PLV and later to native LAD.  He denies chest pain.   - Continue ASA 81 daily.  - Continue Crestor 10 mg daily.  Check lipids today.  3. CKD stage 3b: From pauci-immune vasculitis with glomerulonephritis.  - Follow BMET closely with cardiac med titration.   Followup in 6 wks with APP.   Loralie Champagne 01/13/2021

## 2021-01-16 ENCOUNTER — Other Ambulatory Visit: Payer: Self-pay | Admitting: Cardiovascular Disease

## 2021-01-26 ENCOUNTER — Ambulatory Visit (HOSPITAL_COMMUNITY)
Admission: RE | Admit: 2021-01-26 | Discharge: 2021-01-26 | Disposition: A | Discharge status: Home / Self Care | Payer: Medicare Other | Source: Ambulatory Visit | Attending: Cardiology | Admitting: Cardiology

## 2021-01-26 ENCOUNTER — Other Ambulatory Visit: Payer: Self-pay

## 2021-01-26 DIAGNOSIS — I5022 Chronic systolic (congestive) heart failure: Secondary | ICD-10-CM | POA: Insufficient documentation

## 2021-01-26 LAB — BASIC METABOLIC PANEL
Anion gap: 12 (ref 5–15)
BUN: 40 mg/dL — ABNORMAL HIGH (ref 8–23)
CO2: 22 mmol/L (ref 22–32)
Calcium: 9 mg/dL (ref 8.9–10.3)
Chloride: 104 mmol/L (ref 98–111)
Creatinine, Ser: 2.67 mg/dL — ABNORMAL HIGH (ref 0.61–1.24)
GFR, Estimated: 23 mL/min — ABNORMAL LOW (ref >60)
Glucose, Bld: 121 mg/dL — ABNORMAL HIGH (ref 70–99)
Potassium: 3.8 mmol/L (ref 3.5–5.1)
Sodium: 138 mmol/L (ref 135–145)

## 2021-02-02 ENCOUNTER — Ambulatory Visit: Payer: Medicare Other | Admitting: Cardiovascular Disease

## 2021-02-02 ENCOUNTER — Other Ambulatory Visit (HOSPITAL_COMMUNITY): Payer: Self-pay | Admitting: Cardiology

## 2021-02-06 DIAGNOSIS — Z85828 Personal history of other malignant neoplasm of skin: Secondary | ICD-10-CM | POA: Diagnosis not present

## 2021-02-06 DIAGNOSIS — D0461 Carcinoma in situ of skin of right upper limb, including shoulder: Secondary | ICD-10-CM | POA: Diagnosis not present

## 2021-02-06 DIAGNOSIS — L570 Actinic keratosis: Secondary | ICD-10-CM | POA: Diagnosis not present

## 2021-02-06 DIAGNOSIS — X32XXXD Exposure to sunlight, subsequent encounter: Secondary | ICD-10-CM | POA: Diagnosis not present

## 2021-02-06 DIAGNOSIS — Z08 Encounter for follow-up examination after completed treatment for malignant neoplasm: Secondary | ICD-10-CM | POA: Diagnosis not present

## 2021-02-18 NOTE — Progress Notes (Signed)
Nephrology: Luke Heinz, MD  Cardiology: Dr. [name] HF Cardiology: Dr. Aundra Dubin  85 y.o. with history of CAD, ischemic cardiomyopathy, BOOP, pauci-immune vasculitis with CKD stage 3b, and diffuse large B cell lymphoma was referred by Dr. [name] for evaluation of CHF.  Patient had CABG in 2002.  In later years, he had PCI to native LAD and to sequential SVG-PDA/PLV.  LIMA is known to be atretic and SVG-D and sequential SVG-OM1/OM2 known to be occluded.  Most recent echo in 10/21 showed EF 35-40%.  Patient was diagnosed with pauci-immune vasculitis with glomerulonephritis by biopsy in 2008.  He is followed by Dr. Marval Regal and currently is on Imuran.  CKD stage 3b currently. He has been diagnosed by BOOP and used to be followed by pulmonary (not seen recently).  He was diagnosed with diffuse large B cell lymphoma in 3/18 and had 6 inches of his colon resected.  He was treated with chemotherapy.    Medical management of his cardiomyopathy has been limited by CKD stage 3b.    He returns today for followup of CHF.  Weight is down 6 lbs.  He is sleeping better now that he is taking torsemide in the morning rather than at night.  He is short of breath after walking about 50 feet or walking up stairs.  No chest pain.  No lightheadedness.  BP is mildly elevated today.     Today he returns for HF follow up. No significant exertional dyspnea walking on flat ground but will get short of breath if he walks up an incline or longer distances. Overall feeling fine. Denies CP, dizziness, palpitations, edema, or PND/Orthopnea. Appetite ok. No fever or chills. Weight at home 212 pounds. Taking all medications.   Labs (12/21): K 4.5, creatinine 2.35, pro-BNP 1426 Labs (5/22): K 4.3, creatinine 2.8 with GFR 22 Labs (6/22): K 4.3, creatinine 2.65 with GFR 23 Labs (9/22): K 3.8, creatinine 2.67 GFR 23, HDL 37, LDL 78  ECG (personally reviewed): none ordered today.  PMH: 1. Type 2 diabetes 2. HTN 3. Diffuse  large B cell lymphoma: Diagnosed 3/18, 6 inches of colon resected and treated with chemotherapy.  4. BOOP: Followed by pulmonary  5. GERD with Barrett's esophagus.  6. Glomerulonephritis with vasculitis: Pauci-immune vasculitis. Has had associated pulmonary hemorrhage.  - Prior treatment with Cytoxan and plasmapheresis, currently on Imuran.  - CKD stage 3b.  7. Chronic systolic CHF: Ischemic cardiomyopathy.   - Echo (9/18): EF 35-40%.  - Echo (10/21) with EF 35-40%, mild LVH, severe LAE, normal RV 8. CAD: CABG x 6 in 1/02 with LIMA-LAD, SVG-D1, seq SVG-OM1/2, seq SVG-PDA/PLV.  - NSTEMI 3/14: BMS to seq SVG-PDA/PLV, LIMA atretic but good flow down LAD, SVG-OM1/2 and SVG-D occluded.  - Angina 11/18: PCI to 80% mid LAD stenosis, seq SVG-PDA/PLV remained patent, other grafts occluded (LIMA atretic).   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  no pets currently. No bird or mold exposure.   Social Determinants of Health   Financial Resource Strain: Not on file  Food Insecurity: Not on file  Transportation Needs: Not on file  Physical Activity: Not on file  Stress: Not on file  Social Connections: Not on file  Intimate Partner Violence: Not on file   Family History  Problem Relation Age of Onset   Heart disease Mother    Diabetes Mother    Prostate cancer Father    Depression Other    Diabetes  Other    Prostate cancer Other    Colon polyps Neg Hx    Colon cancer Neg Hx    Rectal cancer Neg Hx    Stomach cancer Neg Hx    Esophageal cancer Neg Hx    ROS: All systems reviewed and negative except as per HPI.   Current Outpatient Medications  Medication Sig Dispense Refill   acetaminophen (TYLENOL) 500 MG tablet Take 500 mg by mouth daily as needed for moderate pain or headache.      aspirin EC 81 MG EC tablet Take 1 tablet (81 mg total) by mouth daily. 30 tablet    azaTHIOprine (IMURAN) 50 MG tablet Take 100 mg by mouth daily.     clopidogrel (PLAVIX) 75 MG tablet TAKE 1 TABLET BY MOUTH  DAILY 90 tablet 3   dorzolamide-timolol (COSOPT) 22.3-6.8 MG/ML ophthalmic solution Place 1 drop into both eyes 2 (two) times daily.      empagliflozin (JARDIANCE) 10 MG TABS tablet Take 1 tablet (10 mg total) by mouth daily before breakfast. 30 tablet 11   finasteride (PROSCAR) 5 MG tablet Take 5 mg by mouth at bedtime.      levothyroxine (SYNTHROID, LEVOTHROID) 100 MCG tablet Take 100 mcg by mouth daily before breakfast.     metoprolol succinate (TOPROL-XL) 50 MG 24 hr tablet Take 1 tablet (50 mg total) by mouth daily. 90 tablet 3   Multiple Vitamin (MULTIVITAMIN) tablet Take 1 tablet by mouth daily.     nitroGLYCERIN (NITROSTAT) 0.4 MG SL tablet Place 1 tablet (0.4 mg total) under the tongue every 5 (five) minutes as needed for chest pain. 25 tablet 3   pantoprazole (PROTONIX) 40 MG tablet TAKE 1 TABLET BY MOUTH  DAILY 90 tablet 3   rosuvastatin (CRESTOR) 10 MG tablet Take 10 mg every morning by mouth.     tamsulosin (FLOMAX) 0.4 MG CAPS capsule Take 0.4 mg by mouth at bedtime.     torsemide (DEMADEX) 20 MG tablet Take 2 tablets (40 mg total) by mouth in the morning. 60 tablet 3   No current facility-administered medications for this encounter.   Facility-Administered Medications Ordered in Other Encounters  Medication Dose Route Frequency Provider Last Rate Last Admin   sodium chloride  flush (NS) 0.9 % injection 10 mL  10 mL Intracatheter PRN Brunetta Genera, MD   10 mL at 08/24/16 1653   sodium chloride flush (NS) 0.9 % injection 10 mL  10 mL Intracatheter PRN Brunetta Genera, MD   10 mL at 10/07/16 1555   Wt Readings from Last 3 Encounters:  02/24/21 98.6 kg (217 lb 6.4 oz)  01/13/21 99.2 kg (218 lb 9.6 oz)  11/11/20 101.7 kg (224 lb 3.2 oz)   BP 130/78   Pulse 90   Wt 98.6 kg (217  lb 6.4 oz)   SpO2 99%   BMI 29.48 kg/m   General:  NAD. No resp difficulty, walked into clinic using cane, elderly HEENT: Normal Neck: Supple. No JVD. Carotids 2+ bilat; no bruits. No lymphadenopathy or thryomegaly appreciated. Cor: PMI nondisplaced. Regular rate & rhythm. No rubs, gallops or murmurs. Lungs: Clear Abdomen: Soft, nontender, nondistended. No hepatosplenomegaly. No bruits or masses. Good bowel sounds. Extremities: No cyanosis, clubbing, rash, edema Neuro: Alert & oriented x 3, cranial nerves grossly intact. Moves all 4 extremities w/o difficulty. Affect pleasant.  Assessment/Plan: 1. Chronic systolic CHF: Ischemic cardiomyopathy. Echo in 10/21 with EF 35-40%, comparable to prior echo in 9/18.  CHF is complicated by CKD stage 3b which worsens fluid retention and also limits GDMT.  He is not volume overloaded on exam today, weight down 1 lb.  NYHA class II-III symptoms.   - Continue torsemide 40 mg daily. - Continue Jardiance 10 mg daily. BMET today. - Continue Toprol XL 50 mg daily. Consider increasing next visit. Will hold off today as he just took his morning medications. - Creatinine has been too high for spironolactone when last checked, will reassess for future use.  - No ARB/ARNI yet with CKD stage 3b.   - Not ICD candidate with age and EF > 35%, narrow QRS so would not benefit from CRT.  2. CAD: S/p CABG 1/02 with subsequent PCI to seq SVG-PDA/PLV and later to native LAD.  He denies chest pain.   - Continue ASA 81 daily.  - Continue Crestor 10 mg daily.   Lipids ok (8/22).  3. CKD stage 3b: From pauci-immune vasculitis with glomerulonephritis.  - Follow BMET closely with cardiac med titration.  4. HTN: Mildly elevated today. Consider increasing beta blocker at next visit and/or addition of low-dose spiro.  Followup in 6-8 wks with APP. May be able to try low-dose spiro if renal function permits.  Luke Mcbride 02/24/2021

## 2021-02-24 ENCOUNTER — Other Ambulatory Visit: Payer: Self-pay

## 2021-02-24 ENCOUNTER — Ambulatory Visit (HOSPITAL_COMMUNITY)
Admission: RE | Admit: 2021-02-24 | Discharge: 2021-02-24 | Disposition: A | Discharge status: Home / Self Care | Payer: Medicare Other | Source: Ambulatory Visit | Attending: Family Medicine | Admitting: Family Medicine

## 2021-02-24 ENCOUNTER — Encounter (HOSPITAL_COMMUNITY): Payer: Self-pay

## 2021-02-24 VITALS — BP 130/78 | HR 90 | Wt 217.4 lb

## 2021-02-24 DIAGNOSIS — Z7982 Long term (current) use of aspirin: Secondary | ICD-10-CM | POA: Diagnosis not present

## 2021-02-24 DIAGNOSIS — Z8249 Family history of ischemic heart disease and other diseases of the circulatory system: Secondary | ICD-10-CM | POA: Insufficient documentation

## 2021-02-24 DIAGNOSIS — I5022 Chronic systolic (congestive) heart failure: Secondary | ICD-10-CM | POA: Insufficient documentation

## 2021-02-24 DIAGNOSIS — E1122 Type 2 diabetes mellitus with diabetic chronic kidney disease: Secondary | ICD-10-CM | POA: Diagnosis not present

## 2021-02-24 DIAGNOSIS — Z7984 Long term (current) use of oral hypoglycemic drugs: Secondary | ICD-10-CM | POA: Diagnosis not present

## 2021-02-24 DIAGNOSIS — Z951 Presence of aortocoronary bypass graft: Secondary | ICD-10-CM | POA: Diagnosis not present

## 2021-02-24 DIAGNOSIS — I130 Hypertensive heart and chronic kidney disease with heart failure and stage 1 through stage 4 chronic kidney disease, or unspecified chronic kidney disease: Secondary | ICD-10-CM | POA: Insufficient documentation

## 2021-02-24 DIAGNOSIS — Z7902 Long term (current) use of antithrombotics/antiplatelets: Secondary | ICD-10-CM | POA: Diagnosis not present

## 2021-02-24 DIAGNOSIS — I2510 Atherosclerotic heart disease of native coronary artery without angina pectoris: Secondary | ICD-10-CM | POA: Insufficient documentation

## 2021-02-24 DIAGNOSIS — Z79624 Long term (current) use of inhibitors of nucleotide synthesis: Secondary | ICD-10-CM | POA: Insufficient documentation

## 2021-02-24 DIAGNOSIS — R0602 Shortness of breath: Secondary | ICD-10-CM | POA: Diagnosis not present

## 2021-02-24 DIAGNOSIS — Z7901 Long term (current) use of anticoagulants: Secondary | ICD-10-CM | POA: Insufficient documentation

## 2021-02-24 DIAGNOSIS — I255 Ischemic cardiomyopathy: Secondary | ICD-10-CM | POA: Insufficient documentation

## 2021-02-24 DIAGNOSIS — I25118 Atherosclerotic heart disease of native coronary artery with other forms of angina pectoris: Secondary | ICD-10-CM

## 2021-02-24 DIAGNOSIS — Z79899 Other long term (current) drug therapy: Secondary | ICD-10-CM | POA: Insufficient documentation

## 2021-02-24 DIAGNOSIS — Z955 Presence of coronary angioplasty implant and graft: Secondary | ICD-10-CM | POA: Insufficient documentation

## 2021-02-24 DIAGNOSIS — N1832 Chronic kidney disease, stage 3b: Secondary | ICD-10-CM | POA: Insufficient documentation

## 2021-02-24 LAB — BASIC METABOLIC PANEL
Anion gap: 10 (ref 5–15)
BUN: 34 mg/dL — ABNORMAL HIGH (ref 8–23)
CO2: 27 mmol/L (ref 22–32)
Calcium: 8.8 mg/dL — ABNORMAL LOW (ref 8.9–10.3)
Chloride: 102 mmol/L (ref 98–111)
Creatinine, Ser: 2.65 mg/dL — ABNORMAL HIGH (ref 0.61–1.24)
GFR, Estimated: 23 mL/min — ABNORMAL LOW (ref >60)
Glucose, Bld: 98 mg/dL (ref 70–99)
Potassium: 4.1 mmol/L (ref 3.5–5.1)
Sodium: 139 mmol/L (ref 135–145)

## 2021-02-24 NOTE — Patient Instructions (Addendum)
Labs were done today, if any labs come back abnormal the clinic will call you   Your physician recommends that you schedule a follow-up appointment in: 8 weeks  At the [facility], you and your health needs are our priority. As part of our continuing mission to provide you with exceptional heart care, we have created designated Provider Care Teams. These Care Teams include your primary Cardiologist (physician) and Advanced Practice Providers (APPs- Physician Assistants and Nurse Practitioners) who all work together to provide you with the care you need, when you need it.   You may see any of the following providers on your designated Care Team at your next follow up: Dr Glori Bickers Dr Loralie Champagne Dr Patrice Paradise, NP Lyda Jester, [place] Ginnie Smart Audry Riles, PharmD   Please be sure to bring in all your medications bottles to every appointment.    If you have any questions or concerns before your next appointment please send [place] a message through [place] or call our office at [phone].    TO LEAVE A MESSAGE FOR THE NURSE SELECT OPTION 2, PLEASE LEAVE A MESSAGE INCLUDING: YOUR NAME DATE OF BIRTH CALL BACK NUMBER REASON FOR CALL**this is important as we prioritize the call backs  YOU WILL RECEIVE A CALL BACK THE SAME DAY AS LONG AS YOU CALL BEFORE 4:00 PM.

## 2021-03-12 ENCOUNTER — Other Ambulatory Visit: Payer: Self-pay

## 2021-03-12 DIAGNOSIS — C8338 Diffuse large B-cell lymphoma, lymph nodes of multiple sites: Secondary | ICD-10-CM

## 2021-03-13 ENCOUNTER — Other Ambulatory Visit: Payer: Self-pay

## 2021-03-13 ENCOUNTER — Inpatient Hospital Stay (HOSPITAL_BASED_OUTPATIENT_CLINIC_OR_DEPARTMENT_OTHER): Payer: Medicare Other | Admitting: Hematology

## 2021-03-13 ENCOUNTER — Inpatient Hospital Stay: Payer: Medicare Other | Attending: Hematology

## 2021-03-13 VITALS — BP 142/85 | HR 87 | Temp 97.7°F | Resp 18 | Ht 72.0 in | Wt 214.4 lb

## 2021-03-13 DIAGNOSIS — D649 Anemia, unspecified: Secondary | ICD-10-CM | POA: Insufficient documentation

## 2021-03-13 DIAGNOSIS — I252 Old myocardial infarction: Secondary | ICD-10-CM | POA: Insufficient documentation

## 2021-03-13 DIAGNOSIS — Z818 Family history of other mental and behavioral disorders: Secondary | ICD-10-CM | POA: Diagnosis not present

## 2021-03-13 DIAGNOSIS — Z8719 Personal history of other diseases of the digestive system: Secondary | ICD-10-CM | POA: Diagnosis not present

## 2021-03-13 DIAGNOSIS — Z8042 Family history of malignant neoplasm of prostate: Secondary | ICD-10-CM | POA: Insufficient documentation

## 2021-03-13 DIAGNOSIS — D84821 Immunodeficiency due to drugs: Secondary | ICD-10-CM | POA: Diagnosis not present

## 2021-03-13 DIAGNOSIS — M1711 Unilateral primary osteoarthritis, right knee: Secondary | ICD-10-CM | POA: Diagnosis not present

## 2021-03-13 DIAGNOSIS — I2510 Atherosclerotic heart disease of native coronary artery without angina pectoris: Secondary | ICD-10-CM | POA: Insufficient documentation

## 2021-03-13 DIAGNOSIS — Z79631 Long term (current) use of antimetabolite agent: Secondary | ICD-10-CM | POA: Insufficient documentation

## 2021-03-13 DIAGNOSIS — K439 Ventral hernia without obstruction or gangrene: Secondary | ICD-10-CM | POA: Insufficient documentation

## 2021-03-13 DIAGNOSIS — Z8249 Family history of ischemic heart disease and other diseases of the circulatory system: Secondary | ICD-10-CM | POA: Insufficient documentation

## 2021-03-13 DIAGNOSIS — C8338 Diffuse large B-cell lymphoma, lymph nodes of multiple sites: Secondary | ICD-10-CM | POA: Insufficient documentation

## 2021-03-13 DIAGNOSIS — Z79624 Long term (current) use of inhibitors of nucleotide synthesis: Secondary | ICD-10-CM | POA: Diagnosis not present

## 2021-03-13 DIAGNOSIS — Z9049 Acquired absence of other specified parts of digestive tract: Secondary | ICD-10-CM | POA: Insufficient documentation

## 2021-03-13 DIAGNOSIS — K219 Gastro-esophageal reflux disease without esophagitis: Secondary | ICD-10-CM | POA: Diagnosis not present

## 2021-03-13 DIAGNOSIS — I700 Atherosclerosis of aorta: Secondary | ICD-10-CM | POA: Diagnosis not present

## 2021-03-13 DIAGNOSIS — Z833 Family history of diabetes mellitus: Secondary | ICD-10-CM | POA: Diagnosis not present

## 2021-03-13 DIAGNOSIS — I130 Hypertensive heart and chronic kidney disease with heart failure and stage 1 through stage 4 chronic kidney disease, or unspecified chronic kidney disease: Secondary | ICD-10-CM | POA: Diagnosis not present

## 2021-03-13 DIAGNOSIS — E785 Hyperlipidemia, unspecified: Secondary | ICD-10-CM | POA: Diagnosis not present

## 2021-03-13 DIAGNOSIS — R7881 Bacteremia: Secondary | ICD-10-CM | POA: Diagnosis not present

## 2021-03-13 DIAGNOSIS — N183 Chronic kidney disease, stage 3 unspecified: Secondary | ICD-10-CM | POA: Insufficient documentation

## 2021-03-13 LAB — CMP (CANCER CENTER ONLY)
ALT: 16 U/L (ref 0–44)
AST: 18 U/L (ref 15–41)
Albumin: 3.6 g/dL (ref 3.5–5.0)
Alkaline Phosphatase: 62 U/L (ref 38–126)
Anion gap: 10 (ref 5–15)
BUN: 38 mg/dL — ABNORMAL HIGH (ref 8–23)
CO2: 27 mmol/L (ref 22–32)
Calcium: 9.1 mg/dL (ref 8.9–10.3)
Chloride: 105 mmol/L (ref 98–111)
Creatinine: 2.46 mg/dL — ABNORMAL HIGH (ref 0.61–1.24)
GFR, Estimated: 25 mL/min — ABNORMAL LOW (ref >60)
Glucose, Bld: 84 mg/dL (ref 70–99)
Potassium: 4.2 mmol/L (ref 3.5–5.1)
Sodium: 142 mmol/L (ref 135–145)
Total Bilirubin: 0.6 mg/dL (ref 0.3–1.2)
Total Protein: 7.3 g/dL (ref 6.5–8.1)

## 2021-03-13 LAB — CBC WITH DIFFERENTIAL (CANCER CENTER ONLY)
Abs Immature Granulocytes: 0.02 10*3/uL (ref 0.00–0.07)
Basophils Absolute: 0 10*3/uL (ref 0.0–0.1)
Basophils Relative: 1 %
Eosinophils Absolute: 0.1 10*3/uL (ref 0.0–0.5)
Eosinophils Relative: 2 %
HCT: 39.2 % (ref 39.0–52.0)
Hemoglobin: 12.9 g/dL — ABNORMAL LOW (ref 13.0–17.0)
Immature Granulocytes: 0 %
Lymphocytes Relative: 6 %
Lymphs Abs: 0.3 10*3/uL — ABNORMAL LOW (ref 0.7–4.0)
MCH: 34.2 pg — ABNORMAL HIGH (ref 26.0–34.0)
MCHC: 32.9 g/dL (ref 30.0–36.0)
MCV: 104 fL — ABNORMAL HIGH (ref 80.0–100.0)
Monocytes Absolute: 0.6 10*3/uL (ref 0.1–1.0)
Monocytes Relative: 11 %
Neutro Abs: 4.6 10*3/uL (ref 1.7–7.7)
Neutrophils Relative %: 80 %
Platelet Count: 167 10*3/uL (ref 150–400)
RBC: 3.77 MIL/uL — ABNORMAL LOW (ref 4.22–5.81)
RDW: 16.5 % — ABNORMAL HIGH (ref 11.5–15.5)
WBC Count: 5.7 10*3/uL (ref 4.0–10.5)
nRBC: 0 % (ref 0.0–0.2)

## 2021-03-13 LAB — LACTATE DEHYDROGENASE: LDH: 181 U/L (ref 98–192)

## 2021-03-16 ENCOUNTER — Telehealth: Payer: Self-pay | Admitting: Hematology

## 2021-03-16 NOTE — Telephone Encounter (Signed)
Left message with follow-up appointment per 10/28 los.

## 2021-03-19 ENCOUNTER — Encounter: Payer: Self-pay | Admitting: Hematology

## 2021-03-19 ENCOUNTER — Encounter (HOSPITAL_COMMUNITY): Payer: Self-pay | Admitting: *Deleted

## 2021-03-19 NOTE — Progress Notes (Signed)
HEMATOLOGY/ONCOLOGY CLINIC NOTE  Date of Service: .03/13/2021   Patient Care Team: Donato Heinz, MD as PCP - General (Nephrology) [name], MD as PCP - Cardiology (Cardiology) Brunetta Genera, MD as Consulting Physician (Hematology)  CHIEF COMPLAINTS/PURPOSE OF CONSULTATION:  F/u for Diffuse large B cell lymphoma of the small intestine  HISTORY OF PRESENTING ILLNESS:   Luke Mcbride is a wonderful 85 y.o. male who has been referred to [place] by Dr .Donato Heinz, MD and Mauricio Po MD for evaluation and management of newly diagnosed diffuse large B-cell lymphoma of the small bowel.  Patient has a history of multiple medical comorbidities as noted below including coronary artery disease status post PCI and CABG, chronic kidney disease stage III, Barrett's esophagus, hypertension, dyslipidemia, interstitial lung disease, GERD, vasculitis involving the lungs and kidneys on chronic Imuran therapy.  He was having upper abdominal discomfort for a few months with loss of about 15-20 pounds. He had a CT scan of the abdomen done by Dr. Havery Moros on 06/15/2016 which showed a 3.3 x 3.2 x 2.4 cm indeterminate lesion involving the left upper quadrant small bowel loops. No additional small bowel lesions noted. No ascites. No retroperitoneal mesenteric or pelvic lymphadenopathy noted.  Patient was seen by Dr. Serita Grammes and underwent a diagnostic laparoscopy with small bowel resection on 07/12/2016. Pathology showed that the lesion was consistent with diffuse large B-cell lymphoma 2 cm in length and 4 cm in width centrally ulcerated mass involving 100% of the bowel circumference. The tumor was about 1 cm thick invading through the entire wall and into the underlying mesenteric fat. The tumor also abuts and focally involve the serosa. No extramural satellite lymph nodes noted. Did not seem to involve the peri-intestinal lymph nodes. Tumor was noted to have a germinal center  phenotype.  Patient is healing well from surgery. Continues to be on Imuran 100 mg by mouth daily. Has not reported any other peripheral enlarged lymph nodes. He is starting to eat better.   INTERVAL HISTORY   Luke Mcbride is here for follow-up for his Diffuse large B cell lymphoma of the small intestine. The patient's last visit with [place] was on 09/12/2020.    Patient reports no acute new symptoms.  His pulmonary and renal vasculitis is well controlled with his current dose of Imuran. Continues to have intermittent skin lesions and is following with dermatology closely for recurrent nonmelanoma skin cancers.  No new lymphadenopathy.  No other acute new focal symptoms.  No fevers no chills no night sweats no unexpected weight loss.  No new shortness of breath or chest pain.  No change in bowel habits..  Lab results today 03/13/2021 of CBC w/diff stable with a hemoglobin of 12.9, WBC count of 5.7k and platelet count of 167k CMP shows chronic kidney disease with a creatinine of 2.46.  Otherwise unremarkable. LDH within normal limits at 181  Eating well, sleeping well.  No other acute new symptoms.  MEDICAL HISTORY:  Past Medical History:  Diagnosis Date   ALLERGIC RHINITIS    Anemia    hx   Barrett's esophagus    BOOP (bronchiolitis obliterans with organizing pneumonia) ([place])    a. s/p R VATS 2008.   CAD (coronary artery disease)    a. 05/2000: NSTEMI/CABG x 6: LIMA->LAD, VG->D1, VG->OM1->2, VG->PDA->RPL;  b. 07/2007 MV: high lat infarct, no ischemia, EF 47%;  c. Cath/PCI: LM nl, LAD20p, 7m, D1 nl, D2 60-70ost, D3 nl, LCX 70ost, 165m,  OM1/OM2 min irregs, RCA 30 diff, PDA 99, LIMA->LAD atretic, VG->D1 100, VG->OM1->2 100, VG->PDA->RPL 90p (4.0x23 Vision BMS);  c. 07/2012 Echo: EF 55%, gr1 DD.   Cataract    CHF (congestive heart failure) (HCC)    CKD (chronic kidney disease), stage III ([place])    a. renal bx 2008: GLN with vasculitis   Diffuse large B cell lymphoma ([place]) 08/07/2016    GERD (gastroesophageal reflux disease)    Glaucoma    a. Cannot see out of L eye.   Hematuria    Microscopic   Hyperglycemia    Patient reported while on prednisone, had to take insulin   Hyperlipemia    Hypertension    Hypothyroidism    ILD (interstitial lung disease) ([place])    Iritis    Local infection of skin and subcutaneous tissue    Membranoproliferative nephritis    Myocardial infarction ([place]) 2002   Neutropenia, drug-induced (HCC)    Recurrent boils    Residual foreign body in soft tissue    Vasculitis ([place])    a.  pauciimmune vasculitis with renal involvement and hx of transient hemoptysis in the past with associated BOOP, 2008 (renal bx 2008: GLN with vasculitis). b. History of treatment with 2 cycles of Cytoxan and pheresis. H/o hemoptysis and pulm hemorrhage with 2nd cycle of cytoxan.    SURGICAL HISTORY: Past Surgical History:  Procedure Laterality Date   BOWEL RESECTION N/A 07/12/2016   Procedure: SMALL BOWEL RESECTION;  Surgeon: Rolm Bookbinder, MD;  Location: [facility];  Service: General;  Laterality: N/A;   CATARACT EXTRACTION Bilateral    CORONARY ARTERY BYPASS GRAFT  2002   CORONARY STENT INTERVENTION N/A 03/31/2017   Procedure: CORONARY STENT INTERVENTION;  Surgeon: Martinique, Peter M, MD;  Location: [facility];  Service: Cardiovascular;  Laterality: N/A;   IR FLUORO GUIDE PORT INSERTION RIGHT  08/20/2016   IR REMOVAL TUN ACCESS W/ PORT W/O FL MOD SED  01/20/2017   IR US GUIDE VASC ACCESS RIGHT  08/20/2016   LAPAROSCOPY N/A 07/12/2016   Procedure: LAPAROSCOPY DIAGNOSTIC;  Surgeon: Rolm Bookbinder, MD;  Location: [facility];  Service: General;  Laterality: N/A;   LEFT HEART CATHETERIZATION WITH CORONARY/GRAFT ANGIOGRAM N/A 07/26/2012   Procedure: LEFT HEART CATHETERIZATION WITH Beatrix Fetters;  Surgeon: Wellington Hampshire, MD;  Location: [facility];  Service: Cardiovascular;  Laterality: N/A;   LUNG BIOPSY     PERCUTANEOUS CORONARY STENT INTERVENTION (PCI-S)   07/26/2012   Procedure: PERCUTANEOUS CORONARY STENT INTERVENTION (PCI-S);  Surgeon: Wellington Hampshire, MD;  Location: [facility];  Service: Cardiovascular;;   RENAL BIOPSY     RIGHT/LEFT HEART CATH AND CORONARY/GRAFT ANGIOGRAPHY N/A 03/23/2017   Procedure: RIGHT/LEFT HEART CATH AND CORONARY/GRAFT ANGIOGRAPHY;  Surgeon: Martinique, Peter M, MD;  Location: [facility];  Service: Cardiovascular;  Laterality: N/A;   TOE AMPUTATION  2009   hammer toe    SOCIAL HISTORY: Social History   Socioeconomic History   Marital status: Married    Spouse name: Not on file   Number of children: Not on file   Years of education: Not on file   Highest education level: Not on file  Occupational History   Occupation: [employer]  Tobacco Use   Smoking status: Never   Smokeless tobacco: Never  Vaping Use   Vaping Use: Never used  Substance and Sexual Activity   Alcohol use: No   Drug use: No   Sexual activity: Not on file  Other  Topics Concern   Not on file  Social History Narrative   Regular exercise - yes      [facility] (07/12/16):   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  no pets currently. No bird or mold exposure.   Social Determinants of Health   Financial Resource Strain: Not on file  Food Insecurity: Not on file  Transportation Needs: Not on file  Physical Activity: Not on file  Stress: Not on file  Social Connections: Not on file  Intimate Partner Violence: Not on file    FAMILY HISTORY: Family History  Problem Relation Age of Onset   Heart disease Mother    Diabetes Mother    Prostate cancer Father    Depression Other    Diabetes Other    Prostate cancer Other    Colon polyps Neg Hx    Colon cancer Neg Hx    Rectal cancer Neg Hx    Stomach cancer Neg Hx    Esophageal cancer Neg Hx     ALLERGIES:  has No Known Allergies.  MEDICATIONS:  Current Outpatient Medications  Medication Sig Dispense Refill   acetaminophen (TYLENOL) 500 MG  tablet Take 500 mg by mouth daily as needed for moderate pain or headache.      aspirin EC 81 MG EC tablet Take 1 tablet (81 mg total) by mouth daily. 30 tablet    azaTHIOprine (IMURAN) 50 MG tablet Take 100 mg by mouth daily.     clopidogrel (PLAVIX) 75 MG tablet TAKE 1 TABLET BY MOUTH  DAILY 90 tablet 3   dorzolamide-timolol (COSOPT) 22.3-6.8 MG/ML ophthalmic solution Place 1 drop into both eyes 2 (two) times daily.      empagliflozin (JARDIANCE) 10 MG TABS tablet Take 1 tablet (10 mg total) by mouth daily before breakfast. 30 tablet 11   finasteride (PROSCAR) 5 MG tablet Take 5 mg by mouth at bedtime.      levothyroxine (SYNTHROID, LEVOTHROID) 100 MCG tablet Take 100 mcg by mouth daily before breakfast.     metoprolol succinate (TOPROL-XL) 50 MG 24 hr tablet Take 1 tablet (50 mg total) by mouth daily. 90 tablet 3   Multiple Vitamin (MULTIVITAMIN) tablet Take 1 tablet by mouth daily.     nitroGLYCERIN (NITROSTAT) 0.4 MG SL tablet Place 1 tablet (0.4 mg total) under the tongue every 5 (five) minutes as needed for chest pain. 25 tablet 3   pantoprazole (PROTONIX) 40 MG tablet TAKE 1 TABLET BY MOUTH  DAILY 90 tablet 3   rosuvastatin (CRESTOR) 10 MG tablet Take 10 mg every morning by mouth.     tamsulosin (FLOMAX) 0.4 MG CAPS capsule Take 0.4 mg by mouth at bedtime.     torsemide (DEMADEX) 20 MG tablet Take 2 tablets (40 mg total) by mouth in the morning. 60 tablet 3   No current facility-administered medications for this visit.   Facility-Administered Medications Ordered in Other Visits  Medication Dose Route Frequency Provider Last Rate Last Admin   sodium chloride flush (NS) 0.9 % injection 10 mL  10 mL Intracatheter PRN Brunetta Genera, MD   10 mL at 08/24/16 1653   sodium chloride flush (NS) 0.9 % injection 10 mL  10 mL Intracatheter PRN Brunetta Genera, MD   10 mL at 10/07/16 1555    REVIEW OF SYSTEMS:  .10 Point review of Systems was done is negative except as noted  above.   PHYSICAL EXAMINATION: ECOG FS:2 - Symptomatic, <50% confined to  bed  Vitals:   03/13/21 1216  BP: (!) 142/85  Pulse: 87  Resp: 18  Temp: 97.7 F (36.5 C)  SpO2: 97%   Wt Readings from Last 3 Encounters:  03/13/21 214 lb 6.4 oz (97.3 kg)  02/24/21 217 lb 6.4 oz (98.6 kg)  01/13/21 218 lb 9.6 oz (99.2 kg)   Body mass index is 29.08 kg/m.   [name] GENERAL:alert, in no acute distress and comfortable SKIN: no acute rashes, no significant lesions EYES: conjunctiva are pink and non-injected, sclera anicteric OROPHARYNX: MMM, no exudates, no oropharyngeal erythema or ulceration NECK: supple, no JVD LYMPH:  no palpable lymphadenopathy in the cervical, axillary or inguinal regions LUNGS: clear to auscultation b/l with normal respiratory effort HEART: regular rate & rhythm ABDOMEN:  normoactive bowel sounds , non tender, not distended. Extremity: no pedal edema PSYCH: alert & oriented x 3 with fluent speech NEURO: no focal motor/sensory deficits   LABORATORY DATA:  I have reviewed the data as listed . CBC Latest Ref Rng & Units 03/13/2021 09/30/2020 09/12/2020  WBC 4.0 - 10.5 K/uL 5.7 6.4 6.2  Hemoglobin 13.0 - 17.0 g/dL 12.9(L) 13.2 12.6(L)  Hematocrit 39.0 - 52.0 % 39.2 40.2 39.1  Platelets 150 - 400 K/uL 167 179 161   . CMP Latest Ref Rng & Units 03/13/2021 02/24/2021 01/26/2021  Glucose 70 - 99 mg/dL 84 98 121(H)  BUN 8 - 23 mg/dL 38(H) 34(H) 40(H)  Creatinine 0.61 - 1.24 mg/dL 2.46(H) 2.65(H) 2.67(H)  Sodium 135 - 145 mmol/L 142 139 138  Potassium 3.5 - 5.1 mmol/L 4.2 4.1 3.8  Chloride 98 - 111 mmol/L 105 102 104  CO2 22 - 32 mmol/L 27 27 22   Calcium 8.9 - 10.3 mg/dL 9.1 8.8(L) 9.0  Total Protein 6.5 - 8.1 g/dL 7.3 - -  Total Bilirubin 0.3 - 1.2 mg/dL 0.6 - -  Alkaline Phos 38 - 126 U/L 62 - -  AST 15 - 41 U/L 18 - -  ALT 0 - 44 U/L 16 - -    Lab Results  Component Value Date   LDH 181 03/13/2021     RADIOGRAPHIC STUDIES: I have personally reviewed the  radiological images as listed and agreed with the findings in the report.  Nm Pet Image Restag (ps) Skull Base To Thigh  Result Date: 10/22/2016 CLINICAL DATA:  Subsequent treatment strategy for diffuse large B-cell non-Hodgkin's lymphoma. EXAM: NUCLEAR MEDICINE PET SKULL BASE TO THIGH TECHNIQUE: 11.15 mCi F-18 FDG was injected intravenously. Full-ring PET imaging was performed from the skull base to thigh after the radiotracer. CT data was obtained and used for attenuation correction and anatomic localization. FASTING BLOOD GLUCOSE:  Value: 102 mg/dl COMPARISON:  None. FINDINGS: NECK No hypermetabolic lymph nodes in the neck. CHEST The FDG avid right axillary lymph nodes and epicardial lymph nodes have resolved in the interval. The focal hypermetabolic activity in the left ventricular myocardium described on the previous study is unchanged. No new abnormalities in the chest. ABDOMEN/PELVIS Significant improvement in the abdomen. No definitive FDG avid disease remains in the liver or spleen. The adenopathy in the abdomen has resolved. Minimal uptake in the normal appearing left adrenal gland is of no significance. No abnormal uptake seen in the soft tissues of the pelvis. The omental nodularity seen previously has resolved. There is increased attenuation in the fat associated with the anterior ventral hernia with low level uptake, likely inflammatory. The maximum SUV is measured on image 161 is 3.7. SKELETON There has been  significant overall improvement in the bones as well. Uptake in the anterior left acetabulum has significantly diminished with a maximum SUV of 3.4 today versus 29.4 previously. The left iliac uptake seen previously is no longer significantly FDG avid. There is mild focal uptake in the posterior left iliac bone on series 4, image 166 which was not seen previously and demonstrates a maximum SUV of 4.26 today. No other bony abnormalities are identified. IMPRESSION: 1. Significant interval  improvement. No FDG avid disease remains in the soft tissues of the neck, chest, abdomen, or pelvis. Very mild uptake remains in the known left anterior acetabular lesion. Mild new uptake in the posterior left iliac bone is nonspecific but could represent mild involvement. Recommend attention on follow-up. 2. Increased attenuation and mild uptake in the fat of a lower midline ventral hernia is likely inflammatory. Recommend attention on follow-up. 3. No other interval change. Electronically Signed   By: Dorise Bullion III M.D   On: 10/22/2016 15:33   PET/CT 08/06/2016: IMPRESSION: 1. In addition to hypermetabolic nodal involvement of the right axilla, pericardial space, porta hepatis/mesenteric root, and retroperitoneum, there is extensive involvement of the liver as well as peritoneal involvement along the liver surface, splenic surface, and upper omentum. 2. Hypermetabolic bony lesions in the left pelvis compatible with malignant involvement. 3. There is a focus of hypermetabolic activity along the anterolateral wall of the left ventricle. Normally such findings turn out to simply be hypermetabolic myocardium. Strictly speaking given how focal this appears I cannot completely exclude the possibility of tumor involvement within or along the myocardium.     Electronically Signed   By: Van Clines M.D.   On: 08/06/2016 12:48    PET/CT 12/29/2016  IMPRESSION: 1. No evidence of residual or recurrent hypermetabolic lymphoma. 2.  Aortic Atherosclerosis (ICD10-I70.0). 3. Dilatation of the infrarenal aorta and pelvic vasculature, as detailed above.     Electronically Signed   By: Abigail Miyamoto M.D.   On: 12/29/2016 15:17    ASSESSMENT & PLAN:   85 y.o.  male with multiple medical co-morbidities including cardiac co-morbidities, IPF, vascular on chronic immunosupression with    1) Stage IV Diffuse large B-cell lymphoma involving the small intestine (germinal cell phenotype) with  complete thickness involvement of bowel and abutting serosa.- currently in remission.  Initial PET/CT scan showed extensive involvement with DLBCL, hypermetabolic nodal involvement of the right axilla, pericardial space, porta hepatis/mesenteric root, and retroperitoneum, there is extensive involvement of the liver as well as peritoneal involvement along the liver surface, splenic surface, and upper omentum. 2. Hypermetabolic bony lesions in the left pelvis compatible with malignant involvement. 3. There is a focus of hypermetabolic activity along the anterolateral wall of the left ventricle.   s/p 6 cycles of R-CEOP .   PET/CT scan on 10/22/2016 after 3 cycles of chemotherapy shows excellent response to treatment. PET/CT scan on 12/29/2016 after 6 cycles of treatment shows No evidence of residual or recurrent hypermetabolic lymphoma  A)Sh/oneutropenic fever and E.coli/pseudomonal bacteremia/HCAP after the first cycle of treatment.  Significant chemotherapy related thrombocytopenia - now resolved. B) h/o hospitalization for perforation diverticulitis - no issues currently  2) Chronic kidney disease stage III baseline creatinine about 2 to 2.5. - stable today 3) Coronary disease status post PCI and CABG - had a myocardial perfusion imaging scan on 06/30/2016 that showed a moderately decreased ejection fraction of 30-44%- following with cardiology for management. Was having some SOB/DOE and had cardiac cath with PCI to mid LAD  on11/15/2018. 4) History of pulmonary and renal vasculitis back on chronic Imuran per Dr Meredeth Ide 5) Neutropenia -resolved  6) Thrombocytopenia due to chemotherapy -resolved  7) Anemia - much improved hgb is up to 12.9 8) Arthritis, especially in right knee -Week of 09/18/17 he had fluid drawn form right knee and given cortisone shot by PCP -Encouraged him to be active with walking or PT   PLAN:  -Discussed pt labwork today, 03/13/2021; blood counts stable, chemistries stable,  LDH wnl -Advised pt that the Imuran can increase the risk of recurrent skin cancer and increases risk of immunosuppression related other malignancies including recurrent lymphomas. -No clinical or lab evidence of lymphoma recurrence at this time. -Advised patient that he is now 4 yrs from treatmenf for his DLBCL we shall switch to yearly oncology followups -Advised pt that we will continue with one more six month visit prior to switching to annual visits. -Continue f/u with Dermatologist. -Will see back in 12 months with labs.           FOLLOW UP: RTC with Dr Irene Limbo with labs in 12 months  . The total time spent in the appointment was 20 minutes and more than 50% was on counseling and direct patient cares.   All of the patient's questions were answered with apparent satisfaction. The patient knows to call the clinic with any problems, questions or concerns.  Sullivan Lone MD [place] AAHIVMS [facility] Hematology/Oncology Physician [facility]

## 2021-03-25 DIAGNOSIS — D0462 Carcinoma in situ of skin of left upper limb, including shoulder: Secondary | ICD-10-CM | POA: Diagnosis not present

## 2021-03-25 DIAGNOSIS — Z08 Encounter for follow-up examination after completed treatment for malignant neoplasm: Secondary | ICD-10-CM | POA: Diagnosis not present

## 2021-03-25 DIAGNOSIS — X32XXXD Exposure to sunlight, subsequent encounter: Secondary | ICD-10-CM | POA: Diagnosis not present

## 2021-03-25 DIAGNOSIS — L928 Other granulomatous disorders of the skin and subcutaneous tissue: Secondary | ICD-10-CM | POA: Diagnosis not present

## 2021-03-25 DIAGNOSIS — Z85828 Personal history of other malignant neoplasm of skin: Secondary | ICD-10-CM | POA: Diagnosis not present

## 2021-03-25 DIAGNOSIS — L570 Actinic keratosis: Secondary | ICD-10-CM | POA: Diagnosis not present

## 2021-04-06 DIAGNOSIS — Z9841 Cataract extraction status, right eye: Secondary | ICD-10-CM | POA: Diagnosis not present

## 2021-04-06 DIAGNOSIS — H401211 Low-tension glaucoma, right eye, mild stage: Secondary | ICD-10-CM | POA: Diagnosis not present

## 2021-04-06 DIAGNOSIS — Z9842 Cataract extraction status, left eye: Secondary | ICD-10-CM | POA: Diagnosis not present

## 2021-04-06 DIAGNOSIS — H5211 Myopia, right eye: Secondary | ICD-10-CM | POA: Diagnosis not present

## 2021-04-06 DIAGNOSIS — H44512 Absolute glaucoma, left eye: Secondary | ICD-10-CM | POA: Diagnosis not present

## 2021-04-17 DIAGNOSIS — Z23 Encounter for immunization: Secondary | ICD-10-CM | POA: Diagnosis not present

## 2021-04-17 DIAGNOSIS — E1122 Type 2 diabetes mellitus with diabetic chronic kidney disease: Secondary | ICD-10-CM | POA: Diagnosis not present

## 2021-04-17 DIAGNOSIS — R809 Proteinuria, unspecified: Secondary | ICD-10-CM | POA: Diagnosis not present

## 2021-04-17 DIAGNOSIS — N184 Chronic kidney disease, stage 4 (severe): Secondary | ICD-10-CM | POA: Diagnosis not present

## 2021-04-17 DIAGNOSIS — I129 Hypertensive chronic kidney disease with stage 1 through stage 4 chronic kidney disease, or unspecified chronic kidney disease: Secondary | ICD-10-CM | POA: Diagnosis not present

## 2021-04-17 DIAGNOSIS — E7800 Pure hypercholesterolemia, unspecified: Secondary | ICD-10-CM | POA: Diagnosis not present

## 2021-04-17 DIAGNOSIS — I5042 Chronic combined systolic (congestive) and diastolic (congestive) heart failure: Secondary | ICD-10-CM | POA: Diagnosis not present

## 2021-04-17 DIAGNOSIS — N058 Unspecified nephritic syndrome with other morphologic changes: Secondary | ICD-10-CM | POA: Diagnosis not present

## 2021-04-17 DIAGNOSIS — I2510 Atherosclerotic heart disease of native coronary artery without angina pectoris: Secondary | ICD-10-CM | POA: Diagnosis not present

## 2021-04-17 DIAGNOSIS — N400 Enlarged prostate without lower urinary tract symptoms: Secondary | ICD-10-CM | POA: Diagnosis not present

## 2021-04-17 DIAGNOSIS — N179 Acute kidney failure, unspecified: Secondary | ICD-10-CM | POA: Diagnosis not present

## 2021-04-17 DIAGNOSIS — N057 Unspecified nephritic syndrome with diffuse crescentic glomerulonephritis: Secondary | ICD-10-CM | POA: Diagnosis not present

## 2021-04-21 NOTE — Progress Notes (Signed)
Nephrology: Luke Heinz, MD  Cardiology: Dr. [name] HF Cardiology: Dr. Aundra Dubin  85 y.o. with history of CAD, ischemic cardiomyopathy, BOOP, pauci-immune vasculitis with CKD stage 3b, and diffuse large B cell lymphoma was referred by Dr. [name] for evaluation of CHF.  Patient had CABG in 2002.  In later years, he had PCI to native LAD and to sequential SVG-PDA/PLV.  LIMA is known to be atretic and SVG-D and sequential SVG-OM1/OM2 known to be occluded.  Most recent echo in 10/21 showed EF 35-40%.  Patient was diagnosed with pauci-immune vasculitis with glomerulonephritis by biopsy in 2008.  He is followed by Dr. Marval Regal and currently is on Imuran.  CKD stage 3b currently. He has been diagnosed by BOOP and used to be followed by pulmonary (not seen recently).  He was diagnosed with diffuse large B cell lymphoma in 3/18 and had 6 inches of his colon resected.  He was treated with chemotherapy.    Medical management of his cardiomyopathy has been limited by CKD stage 3b.   Today he returns for HF follow up. No significant exertional dyspnea walking on flat ground but will get short of breath if he walks up an incline or with steps. Overall feeling fine. Denies  CP, dizziness, palpitations, abnormal  bleeding, edema, or PND/Orthopnea. Appetite ok. No fever or chills. Weight at home 212-214 pounds. Taking all medications. BP normally 140/80 at home. Had labs last week at Nephrology visit.  ReDs: 24%  Labs (12/21): K 4.5, creatinine 2.35, pro-BNP 1426 Labs (5/22): K 4.3, creatinine 2.8 with GFR 22 Labs (6/22): K 4.3, creatinine 2.65 with GFR 23 Labs (9/22): K 3.8, creatinine 2.67 GFR 23, HDL 37, LDL 78 Labs (10/22): K 4.2, creatinine 2.46 GFR 25  ECG (personally reviewed): SR  PMH: 1. Type 2 diabetes 2. HTN 3. Diffuse large B cell lymphoma: Diagnosed 3/18, 6 inches of colon resected and treated with chemotherapy.  4. BOOP: Followed by pulmonary  5. GERD with Barrett's esophagus.  6.  Glomerulonephritis with vasculitis: Pauci-immune vasculitis. Has had associated pulmonary hemorrhage.  - Prior treatment with Cytoxan and plasmapheresis, currently on Imuran.  - CKD stage 3b.  7. Chronic systolic CHF: Ischemic cardiomyopathy.   - Echo (9/18): EF 35-40%.  - Echo (10/21) with EF 35-40%, mild LVH, severe LAE, normal RV 8. CAD: CABG x 6 in 1/02 with LIMA-LAD, SVG-D1, seq SVG-OM1/2, seq SVG-PDA/PLV.  - NSTEMI 3/14: BMS to seq SVG-PDA/PLV, LIMA atretic but good flow down LAD, SVG-OM1/2 and SVG-D occluded.  - Angina 11/18: PCI to 80% mid LAD stenosis, seq SVG-PDA/PLV remained patent, other grafts occluded (LIMA atretic).   Social History   Socioeconomic History   Marital status: Married    Spouse name: Not on file   Number of children: Not on file   Years of education: Not on file   Highest education level: Not on file  Occupational History   Occupation: [employer]  Tobacco Use   Smoking status: Never   Smokeless tobacco: Never  Vaping Use   Vaping Use: Never used  Substance and Sexual Activity   Alcohol use: No   Drug use: No   Sexual activity: Not on file  Other Topics Concern   Not on file  Social History Narrative   Regular exercise - yes      [facility] (07/12/16):   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  no pets currently. No bird or mold exposure.   Social Determinants of Health  Financial Resource Strain: Not on file  Food Insecurity: Not on file  Transportation Needs: Not on file  Physical Activity: Not on file  Stress: Not on file  Social Connections: Not on file  Intimate Partner Violence: Not on file   Family History  Problem Relation Age of Onset   Heart disease Mother    Diabetes Mother    Prostate cancer Father    Depression Other    Diabetes Other    Prostate cancer Other    Colon polyps Neg Hx    Colon cancer Neg Hx    Rectal cancer Neg Hx    Stomach cancer Neg Hx    Esophageal cancer Neg Hx    ROS:  All systems reviewed and negative except as per HPI.   Current Outpatient Medications  Medication Sig Dispense Refill   acetaminophen (TYLENOL) 500 MG tablet Take 500 mg by mouth daily as needed for moderate pain or headache.      aspirin EC 81 MG EC tablet Take 1 tablet (81 mg total) by mouth daily. 30 tablet    azaTHIOprine (IMURAN) 50 MG tablet Take 100 mg by mouth daily.     clopidogrel (PLAVIX) 75 MG tablet TAKE 1 TABLET BY MOUTH  DAILY 90 tablet 3   dorzolamide-timolol (COSOPT) 22.3-6.8 MG/ML ophthalmic solution Place 1 drop into both eyes 2 (two) times daily.      empagliflozin (JARDIANCE) 10 MG TABS tablet Take 1 tablet (10 mg total) by mouth daily before breakfast. 30 tablet 11   finasteride (PROSCAR) 5 MG tablet Take 5 mg by mouth at bedtime.      levothyroxine (SYNTHROID, LEVOTHROID) 100 MCG tablet Take 100 mcg by mouth daily before breakfast.     metoprolol succinate (TOPROL-XL) 50 MG 24 hr tablet Take 1 tablet (50 mg total) by mouth daily. 90 tablet 3   Multiple Vitamin (MULTIVITAMIN) tablet Take 1 tablet by mouth daily.     nitroGLYCERIN (NITROSTAT) 0.4 MG SL tablet Place 1 tablet (0.4 mg total) under the tongue every 5 (five) minutes as needed for chest pain. 25 tablet 3   pantoprazole (PROTONIX) 40 MG tablet TAKE 1 TABLET BY MOUTH  DAILY 90 tablet 3   rosuvastatin (CRESTOR) 10 MG tablet Take 10 mg every morning by mouth.     tamsulosin (FLOMAX) 0.4 MG CAPS capsule Take 0.4 mg by mouth at bedtime.     torsemide (DEMADEX) 20 MG tablet Take 2 tablets (40 mg total) by mouth in the morning. 60 tablet 3   No current facility-administered medications for this encounter.   Facility-Administered Medications Ordered in Other Encounters  Medication Dose Route Frequency Provider Last Rate Last Admin   sodium chloride flush (NS) 0.9 % injection 10 mL  10 mL Intracatheter PRN Brunetta Genera, MD   10 mL at 08/24/16 1653   sodium chloride flush (NS) 0.9 % injection 10 mL  10 mL  Intracatheter PRN Brunetta Genera, MD   10 mL at 10/07/16 1555   Wt Readings from Last 3 Encounters:  04/22/21 99.2 kg (218 lb 12.8 oz)  03/13/21 97.3 kg (214 lb 6.4 oz)  02/24/21 98.6 kg (217 lb 6.4 oz)   BP 132/82 (BP Location: Right Arm)   Pulse 81   Wt 99.2 kg (218 lb 12.8 oz)   SpO2 96%   BMI 29.67 kg/m   General:  NAD. No resp difficulty, elderly, walked into clinic. HEENT: Normal Neck: Supple. No JVD. Carotids 2+ bilat; no bruits. No  lymphadenopathy or thryomegaly appreciated. Cor: PMI nondisplaced. Regular rate & rhythm. No rubs, gallops or murmurs. Lungs: Clear Abdomen: Soft, nontender, nondistended. No hepatosplenomegaly. No bruits or masses. Good bowel sounds. Extremities: No cyanosis, clubbing, rash, edema Neuro: Alert & oriented x 3, cranial nerves grossly intact. Moves all 4 extremities w/o difficulty. Affect pleasant.  Assessment/Plan: 1. Chronic systolic CHF: Ischemic cardiomyopathy. Echo in 10/21 with EF 35-40%, comparable to prior echo in 9/18.  CHF is complicated by CKD stage 3b which worsens fluid retention and also limits GDMT.  He is not volume overloaded on exam today, ReDs 24%.  Stable NYHA class II symptoms.   - Continue torsemide 40 mg daily. - Continue Jardiance 10 mg daily. Request labs from nephrology visit last week. - Continue Toprol XL 50 mg daily. Consider increasing next visit. Will hold off today as he has not had his morning medications yet. - Creatinine has been too high for spironolactone when last checked, will reassess for future use.  - No ARB/ARNI yet with CKD stage 3b.   - Not ICD candidate with age and EF > 35%, narrow QRS so would not benefit from CRT.  - Update echo. 2. CAD: S/p CABG 1/02 with subsequent PCI to seq SVG-PDA/PLV and later to native LAD.  He denies chest pain.   - Continue ASA 81 daily.  - Continue Crestor 10 mg daily.  Lipids ok (8/22).  3. CKD stage 3b: From pauci-immune vasculitis with glomerulonephritis.  -  Follow BMET closely with cardiac med titration.  4. HTN: Better controlled today.  Followup with Dr. Aundra Dubin in 2 months + echo.  [place] FNP 04/22/2021

## 2021-04-22 ENCOUNTER — Other Ambulatory Visit: Payer: Self-pay

## 2021-04-22 ENCOUNTER — Encounter (HOSPITAL_COMMUNITY): Payer: Self-pay

## 2021-04-22 ENCOUNTER — Ambulatory Visit (HOSPITAL_COMMUNITY)
Admission: RE | Admit: 2021-04-22 | Discharge: 2021-04-22 | Disposition: A | Discharge status: Home / Self Care | Payer: Medicare Other | Source: Ambulatory Visit | Attending: Family Medicine | Admitting: Family Medicine

## 2021-04-22 VITALS — BP 132/82 | HR 81 | Wt 218.8 lb

## 2021-04-22 DIAGNOSIS — I2510 Atherosclerotic heart disease of native coronary artery without angina pectoris: Secondary | ICD-10-CM | POA: Diagnosis not present

## 2021-04-22 DIAGNOSIS — E7800 Pure hypercholesterolemia, unspecified: Secondary | ICD-10-CM

## 2021-04-22 DIAGNOSIS — I255 Ischemic cardiomyopathy: Secondary | ICD-10-CM | POA: Diagnosis not present

## 2021-04-22 DIAGNOSIS — R0602 Shortness of breath: Secondary | ICD-10-CM | POA: Insufficient documentation

## 2021-04-22 DIAGNOSIS — E1122 Type 2 diabetes mellitus with diabetic chronic kidney disease: Secondary | ICD-10-CM | POA: Diagnosis not present

## 2021-04-22 DIAGNOSIS — N1832 Chronic kidney disease, stage 3b: Secondary | ICD-10-CM | POA: Diagnosis not present

## 2021-04-22 DIAGNOSIS — Z7982 Long term (current) use of aspirin: Secondary | ICD-10-CM | POA: Insufficient documentation

## 2021-04-22 DIAGNOSIS — I25118 Atherosclerotic heart disease of native coronary artery with other forms of angina pectoris: Secondary | ICD-10-CM

## 2021-04-22 DIAGNOSIS — Z951 Presence of aortocoronary bypass graft: Secondary | ICD-10-CM

## 2021-04-22 DIAGNOSIS — I5022 Chronic systolic (congestive) heart failure: Secondary | ICD-10-CM | POA: Diagnosis not present

## 2021-04-22 DIAGNOSIS — Z7901 Long term (current) use of anticoagulants: Secondary | ICD-10-CM | POA: Diagnosis not present

## 2021-04-22 DIAGNOSIS — Z7984 Long term (current) use of oral hypoglycemic drugs: Secondary | ICD-10-CM | POA: Insufficient documentation

## 2021-04-22 DIAGNOSIS — Z955 Presence of coronary angioplasty implant and graft: Secondary | ICD-10-CM | POA: Diagnosis not present

## 2021-04-22 DIAGNOSIS — I10 Essential (primary) hypertension: Secondary | ICD-10-CM | POA: Diagnosis not present

## 2021-04-22 DIAGNOSIS — I130 Hypertensive heart and chronic kidney disease with heart failure and stage 1 through stage 4 chronic kidney disease, or unspecified chronic kidney disease: Secondary | ICD-10-CM | POA: Insufficient documentation

## 2021-04-22 NOTE — Addendum Note (Signed)
Encounter addended by: Payton Mccallum, RN on: 04/22/2021 11:32 AM  Actions taken: Order list changed, Diagnosis association updated, Clinical Note Signed

## 2021-04-22 NOTE — Progress Notes (Signed)
ReDS Vest / Clip - 04/22/21 1000       ReDS Vest / Clip   Station Marker C    Ruler Value 33.5    ReDS Value Range Low volume    ReDS Actual Value 24

## 2021-04-22 NOTE — Patient Instructions (Signed)
Thank you for coming in today  EKG was done today  Your physician recommends that you schedule a follow-up appointment in: 2 months with echocardiogram with Dr. Haroldine Laws  At the [facility], you and your health needs are our priority. As part of our continuing mission to provide you with exceptional heart care, we have created designated Provider Care Teams. These Care Teams include your primary Cardiologist (physician) and Advanced Practice Providers (APPs- Physician Assistants and Nurse Practitioners) who all work together to provide you with the care you need, when you need it.   You may see any of the following providers on your designated Care Team at your next follow up: Dr Glori Bickers Dr Haynes Kerns, NP Lyda Jester, [place] [facility] [place], [place] Audry Riles, PharmD   Please be sure to bring in all your medications bottles to every appointment.    If you have any questions or concerns before your next appointment please send [place] a message through [employer] or call our office at [phone].    TO LEAVE A MESSAGE FOR THE NURSE SELECT OPTION 2, PLEASE LEAVE A MESSAGE INCLUDING: YOUR NAME DATE OF BIRTH CALL BACK NUMBER REASON FOR CALL**this is important as we prioritize the call backs  YOU WILL RECEIVE A CALL BACK THE SAME DAY AS LONG AS YOU CALL BEFORE 4:00 PM

## 2021-05-08 ENCOUNTER — Other Ambulatory Visit (HOSPITAL_COMMUNITY): Payer: Self-pay | Admitting: Cardiology

## 2021-06-24 ENCOUNTER — Telehealth (HOSPITAL_COMMUNITY): Payer: Self-pay | Admitting: Surgery

## 2021-06-24 NOTE — Telephone Encounter (Signed)
Patient called regarding not "feeling well'.  He says that "he feels like his BP was high earlier" and that his "heart condition may be getting worse".  However also says when he check his BP it was "low-130/80".  He tells me that he has had a great deal of stress lately and has also been unable to sleep.  He asked to be seen in the clinic and I reminded him that he had an upcoming appt with Dr. Aundra Dubin March 10th.  I advised that I would check with the provider regarding his concerns.

## 2021-07-23 DIAGNOSIS — N057 Unspecified nephritic syndrome with diffuse crescentic glomerulonephritis: Secondary | ICD-10-CM | POA: Diagnosis not present

## 2021-07-23 DIAGNOSIS — I2510 Atherosclerotic heart disease of native coronary artery without angina pectoris: Secondary | ICD-10-CM | POA: Diagnosis not present

## 2021-07-23 DIAGNOSIS — I129 Hypertensive chronic kidney disease with stage 1 through stage 4 chronic kidney disease, or unspecified chronic kidney disease: Secondary | ICD-10-CM | POA: Diagnosis not present

## 2021-07-23 DIAGNOSIS — R809 Proteinuria, unspecified: Secondary | ICD-10-CM | POA: Diagnosis not present

## 2021-07-23 DIAGNOSIS — N400 Enlarged prostate without lower urinary tract symptoms: Secondary | ICD-10-CM | POA: Diagnosis not present

## 2021-07-23 DIAGNOSIS — N184 Chronic kidney disease, stage 4 (severe): Secondary | ICD-10-CM | POA: Diagnosis not present

## 2021-07-23 DIAGNOSIS — E1122 Type 2 diabetes mellitus with diabetic chronic kidney disease: Secondary | ICD-10-CM | POA: Diagnosis not present

## 2021-07-23 DIAGNOSIS — N179 Acute kidney failure, unspecified: Secondary | ICD-10-CM | POA: Diagnosis not present

## 2021-07-23 DIAGNOSIS — E7800 Pure hypercholesterolemia, unspecified: Secondary | ICD-10-CM | POA: Diagnosis not present

## 2021-07-23 DIAGNOSIS — I5042 Chronic combined systolic (congestive) and diastolic (congestive) heart failure: Secondary | ICD-10-CM | POA: Diagnosis not present

## 2021-07-23 DIAGNOSIS — N058 Unspecified nephritic syndrome with other morphologic changes: Secondary | ICD-10-CM | POA: Diagnosis not present

## 2021-07-23 DIAGNOSIS — G629 Polyneuropathy, unspecified: Secondary | ICD-10-CM | POA: Diagnosis not present

## 2021-07-24 ENCOUNTER — Ambulatory Visit (HOSPITAL_COMMUNITY)
Admission: RE | Admit: 2021-07-24 | Discharge: 2021-07-24 | Disposition: A | Discharge status: Home / Self Care | Payer: Medicare Other | Source: Ambulatory Visit | Attending: Cardiology | Admitting: Cardiology

## 2021-07-24 ENCOUNTER — Encounter (HOSPITAL_COMMUNITY): Payer: Self-pay | Admitting: Cardiology

## 2021-07-24 ENCOUNTER — Other Ambulatory Visit (HOSPITAL_COMMUNITY): Payer: Self-pay

## 2021-07-24 ENCOUNTER — Ambulatory Visit (HOSPITAL_BASED_OUTPATIENT_CLINIC_OR_DEPARTMENT_OTHER)
Admission: RE | Admit: 2021-07-24 | Discharge: 2021-07-24 | Disposition: A | Discharge status: Home / Self Care | Payer: Medicare Other | Source: Ambulatory Visit | Attending: Cardiology | Admitting: Cardiology

## 2021-07-24 ENCOUNTER — Other Ambulatory Visit: Payer: Self-pay

## 2021-07-24 VITALS — BP 138/80 | HR 81 | Wt 211.8 lb

## 2021-07-24 DIAGNOSIS — Z79899 Other long term (current) drug therapy: Secondary | ICD-10-CM | POA: Diagnosis not present

## 2021-07-24 DIAGNOSIS — N1832 Chronic kidney disease, stage 3b: Secondary | ICD-10-CM | POA: Insufficient documentation

## 2021-07-24 DIAGNOSIS — Z7982 Long term (current) use of aspirin: Secondary | ICD-10-CM | POA: Insufficient documentation

## 2021-07-24 DIAGNOSIS — I5022 Chronic systolic (congestive) heart failure: Secondary | ICD-10-CM | POA: Insufficient documentation

## 2021-07-24 DIAGNOSIS — I25118 Atherosclerotic heart disease of native coronary artery with other forms of angina pectoris: Secondary | ICD-10-CM

## 2021-07-24 DIAGNOSIS — Z7984 Long term (current) use of oral hypoglycemic drugs: Secondary | ICD-10-CM | POA: Insufficient documentation

## 2021-07-24 DIAGNOSIS — I255 Ischemic cardiomyopathy: Secondary | ICD-10-CM | POA: Insufficient documentation

## 2021-07-24 DIAGNOSIS — I2510 Atherosclerotic heart disease of native coronary artery without angina pectoris: Secondary | ICD-10-CM | POA: Diagnosis not present

## 2021-07-24 DIAGNOSIS — Z955 Presence of coronary angioplasty implant and graft: Secondary | ICD-10-CM | POA: Insufficient documentation

## 2021-07-24 DIAGNOSIS — Z951 Presence of aortocoronary bypass graft: Secondary | ICD-10-CM | POA: Diagnosis not present

## 2021-07-24 LAB — ECHOCARDIOGRAM COMPLETE
Calc EF: 36.2 %
S' Lateral: 4.2 cm
Single Plane A2C EF: 36.3 %
Single Plane A4C EF: 34.9 %

## 2021-07-24 MED ORDER — DAPAGLIFLOZIN PROPANEDIOL 10 MG PO TABS: 10.0000 mg | ORAL_TABLET | Freq: Every day | ORAL | 0 refills | Status: DC

## 2021-07-24 MED ORDER — EMPAGLIFLOZIN 10 MG PO TABS: 10.0000 mg | ORAL_TABLET | Freq: Every day | ORAL | 3 refills | Status: DC

## 2021-07-24 MED ORDER — METOPROLOL SUCCINATE ER 50 MG PO TB24: 75.0000 mg | ORAL_TABLET | Freq: Every day | ORAL | 5 refills | Status: DC

## 2021-07-24 NOTE — Progress Notes (Signed)
ReDS Vest / Clip - 07/24/21 1300   ? ?  ? ReDS Vest / Clip  ? Station Marker C   ? Ruler Value 35.5   ? ReDS Value Range Low volume   ? ReDS Actual Value 25   ? ?  ?  ? ?  ? ? ?

## 2021-07-24 NOTE — Patient Instructions (Signed)
Thank you for visit. ? ?Increase Toprol XL to 75 mg Daily. ? ?Your physician recommends that you schedule a follow-up appointment in: 3 months ? ?If you have any questions or concerns before your next appointment please send [place] a message through [place] or call our office at [phone].   ? ?TO LEAVE A MESSAGE FOR THE NURSE SELECT OPTION 2, PLEASE LEAVE A MESSAGE INCLUDING: ?YOUR NAME ?DATE OF BIRTH ?CALL BACK NUMBER ?REASON FOR CALL**this is important as we prioritize the call backs ? ?YOU WILL RECEIVE A CALL BACK THE SAME DAY AS LONG AS YOU CALL BEFORE 4:00 PM ? ?At the [facility], you and your health needs are our priority. As part of our continuing mission to provide you with exceptional heart care, we have created designated Provider Care Teams. These Care Teams include your primary Cardiologist (physician) and Advanced Practice Providers (APPs- Physician Assistants and Nurse Practitioners) who all work together to provide you with the care you need, when you need it.  ? ?You may see any of the following providers on your designated Care Team at your next follow up: ?Dr Glori Bickers ?Dr Loralie Champagne ?Darrick Grinder, NP ?Lyda Jester, PA ?Jessica Milford,NP ?Marlyce Huge, PA ?Audry Riles, PharmD ? ? ?Please be sure to bring in all your medications bottles to every appointment.  ? ? ?

## 2021-07-24 NOTE — Progress Notes (Signed)
?  Echocardiogram ?2D Echocardiogram has been performed. ? ?Luke Mcbride ?07/24/2021, 11:34 AM ?

## 2021-07-25 NOTE — Progress Notes (Signed)
Nephrology: Donato Heinz, MD  ?Cardiology: Dr. [name] ?HF Cardiology: Dr. Aundra Dubin ? ?86 y.o. with history of CAD, ischemic cardiomyopathy, BOOP, pauci-immune vasculitis with CKD stage 3b, and diffuse large B cell lymphoma was referred by Dr. [name] for evaluation of CHF.  Patient had CABG in 2002.  In later years, he had PCI to native LAD and to sequential SVG-PDA/PLV.  LIMA is known to be atretic and SVG-D and sequential SVG-OM1/OM2 known to be occluded.  Most recent echo in 10/21 showed EF 35-40%.  Patient was diagnosed with pauci-immune vasculitis with glomerulonephritis by biopsy in 2008.  He is followed by Dr. Marval Regal and currently is on Imuran.  CKD stage 3b currently. He has been diagnosed by BOOP and used to be followed by pulmonary (not seen recently).  He was diagnosed with diffuse large B cell lymphoma in 3/18 and had 6 inches of his colon resected.  He was treated with chemotherapy.   ? ?Medical management of his cardiomyopathy has been limited by CKD stage 3b.   ? ?Echo was done today and reviewed, EF 35%, global hypokinesis, mildly decreased RV systolic function.  ? ?He returns today for followup of CHF.  Weight is down 7 lbs.  He is generally fatigued, does not get much exercise.  He is short of breath walking up stairs and hills.  No dyspnea walking on flat ground.  No chest pain.  No lightheadedness.  No orthopnea/PND.    ? ?Labs (12/21): K 4.5, creatinine 2.35, pro-BNP 1426 ?Labs (5/22): K 4.3, creatinine 2.8 with GFR 22 ?Labs (6/22): K 4.3, creatinine 2.65 with GFR 23 ?Labs (8/22): LDL 78 ?Labs (12/22): K 4.3, creatinine 2.51 ? ?REDS clip 25% ? ?PMH: ?1. Type 2 diabetes ?2. HTN ?3. Diffuse large B cell lymphoma: Diagnosed 3/18, 6 inches of colon resected and treated with chemotherapy.  ?4. BOOP: Followed by pulmonary  ?5. GERD with Barrett's esophagus.  ?6. Glomerulonephritis with vasculitis: Pauci-immune vasculitis. Has had associated pulmonary hemorrhage.  ?- Prior treatment with Cytoxan  and plasmapheresis, currently on Imuran.  ?- CKD stage 3b.  ?7. Chronic systolic CHF: Ischemic cardiomyopathy.   ?- Echo (9/18): EF 35-40%.  ?- Echo (10/21) with EF 35-40%, mild LVH, severe LAE, normal RV ?- Echo (3/23): EF 35%, global hypokinesis, mildly decreased RV systolic function.  ?8. CAD: CABG x 6 in 1/02 with LIMA-LAD, SVG-D1, seq SVG-OM1/2, seq SVG-PDA/PLV.  ?- NSTEMI 3/14: BMS to seq SVG-PDA/PLV, LIMA atretic but good flow down LAD, SVG-OM1/2 and SVG-D occluded.  ?- Angina 11/18: PCI to 80% mid LAD stenosis, seq SVG-PDA/PLV remained patent, other grafts occluded (LIMA atretic).  ? ?Social History  ? ?Socioeconomic History  ? Marital status: Married  ?  Spouse name: Not on file  ? Number of children: Not on file  ? Years of education: Not on file  ? Highest education level: Not on file  ?Occupational History  ? Occupation: [employer]  ?Tobacco Use  ? Smoking status: Never  ? Smokeless tobacco: Never  ?Vaping Use  ? Vaping Use: Never used  ?Substance and Sexual Activity  ? Alcohol use: No  ? Drug use: No  ? Sexual activity: Not on file  ?Other Topics Concern  ? Not on file  ?Social History Narrative  ? Regular exercise - yes  ?   ? [facility] (07/12/16):  ? 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  no pets currently. No bird or mold exposure.  ? ?Social Determinants of Health  ? ?  Financial Resource Strain: Not on file  ?Food Insecurity: Not on file  ?Transportation Needs: Not on file  ?Physical Activity: Not on file  ?Stress: Not on file  ?Social Connections: Not on file  ?Intimate Partner Violence: Not on file  ? ?Family History  ?Problem Relation Age of Onset  ? Heart disease Mother   ? Diabetes Mother   ? Prostate cancer Father   ? Depression Other   ? Diabetes Other   ? Prostate cancer Other   ? Colon polyps Neg Hx   ? Colon cancer Neg Hx   ? Rectal cancer Neg Hx   ? Stomach cancer Neg Hx   ? Esophageal cancer Neg Hx   ? ?ROS: All systems reviewed and negative except as per  HPI.  ? ?Current Outpatient Medications  ?Medication Sig Dispense Refill  ? acetaminophen (TYLENOL) 500 MG tablet Take 500 mg by mouth daily as needed for moderate pain or headache.     ? aspirin EC 81 MG EC tablet Take 1 tablet (81 mg total) by mouth daily. 30 tablet   ? azaTHIOprine (IMURAN) 50 MG tablet Take 100 mg by mouth daily.    ? clopidogrel (PLAVIX) 75 MG tablet TAKE 1 TABLET BY MOUTH  DAILY 90 tablet 3  ? dorzolamide-timolol (COSOPT) 22.3-6.8 MG/ML ophthalmic solution Place 1 drop into both eyes 2 (two) times daily.     ? finasteride (PROSCAR) 5 MG tablet Take 5 mg by mouth at bedtime.     ? levothyroxine (SYNTHROID, LEVOTHROID) 100 MCG tablet Take 100 mcg by mouth daily before breakfast.    ? Multiple Vitamin (MULTIVITAMIN) tablet Take 1 tablet by mouth daily.    ? nitroGLYCERIN (NITROSTAT) 0.4 MG SL tablet Place 1 tablet (0.4 mg total) under the tongue every 5 (five) minutes as needed for chest pain. 25 tablet 3  ? pantoprazole (PROTONIX) 40 MG tablet TAKE 1 TABLET BY MOUTH  DAILY 90 tablet 3  ? rosuvastatin (CRESTOR) 10 MG tablet Take 10 mg every morning by mouth.    ? tamsulosin (FLOMAX) 0.4 MG CAPS capsule Take 0.4 mg by mouth at bedtime.    ? torsemide (DEMADEX) 20 MG tablet Take 2 tablets (40 mg total) by mouth in the morning. 60 tablet 3  ? empagliflozin (JARDIANCE) 10 MG TABS tablet Take 1 tablet (10 mg total) by mouth daily before breakfast. 90 tablet 3  ? metoprolol succinate (TOPROL-XL) 50 MG 24 hr tablet Take 1.5 tablets (75 mg total) by mouth daily. 90 tablet 5  ? ?No current facility-administered medications for this encounter.  ? ?Facility-Administered Medications Ordered in Other Encounters  ?Medication Dose Route Frequency Provider Last Rate Last Admin  ? sodium chloride flush (NS) 0.9 % injection 10 mL  10 mL Intracatheter PRN Brunetta Genera, MD   10 mL at 08/24/16 1653  ? sodium chloride flush (NS) 0.9 % injection 10 mL  10 mL Intracatheter PRN Brunetta Genera, MD   10 mL at  10/07/16 1555  ? ?BP 138/80   Pulse 81   Wt 96.1 kg (211 lb 12.8 oz)   SpO2 97%   BMI 28.73 kg/m?  ?General: NAD ?Neck: No JVD, no thyromegaly or thyroid nodule.  ?Lungs: Clear to auscultation bilaterally with normal respiratory effort. ?CV: Nondisplaced PMI.  Heart regular S1/S2, no S3/S4, no murmur.  1+ ankle edema.  No carotid bruit.  Normal pedal pulses.  ?Abdomen: Soft, nontender, no hepatosplenomegaly, no distention.  ?Skin: Intact without lesions or rashes.  ?  Neurologic: Alert and oriented x 3.  ?Psych: Normal affect. ?Extremities: No clubbing or cyanosis.  ?HEENT: Normal.  ? ?Assessment/Plan: ?1. Chronic systolic CHF: Ischemic cardiomyopathy. Echo in 10/21 with EF 35-40%, comparable to prior echo in 9/18.  Echo today with EF 35%, mildly decreased RV systolic function.  CHF is complicated by CKD stage 3b which worsens fluid retention and also limits GDMT.  He is not significantly volume overloaded on exam today and weight is down 7 lbs. NYHA class II-III symptoms.   ?- Continue torsemide 40 mg daily, will get BMET report done yesterday at nephrology office.  ?- Continue Jardiance 10 mg daily, GFR ok for this on last BMET.  ?- Creatinine has been too high for spironolactone when last checked, will reassess for future use.  ?- Increase Toprol XL to 75 mg daily.  ?- No ARB/ARNI yet with CKD stage 3b.   ?- Not ICD candidate with age, narrow QRS so would not benefit from CRT.  ?2. CAD: S/p CABG 1/02 with subsequent PCI to seq SVG-PDA/PLV and later to native LAD.  He denies chest pain.   ?- Continue ASA 81 daily.  ?- Continue Crestor 10 mg daily.  Lipids ok in 7/22.  ?3. CKD stage 3b: From pauci-immune vasculitis with glomerulonephritis.  ? ?Followup in 3 months with APP.  ? ?Loralie Champagne ?07/25/2021 ? ? ? ?

## 2021-07-27 ENCOUNTER — Telehealth (HOSPITAL_COMMUNITY): Payer: Self-pay | Admitting: Pharmacy Technician

## 2021-07-27 ENCOUNTER — Other Ambulatory Visit (HOSPITAL_COMMUNITY): Payer: Self-pay

## 2021-07-27 NOTE — Telephone Encounter (Signed)
Advanced Heart Failure Patient Advocate Encounter ? ?Called and spoke with patient regarding Jardiance co-pay. He states that the $400 was from December 31. I called and spoke with [facility] mail order, they stated a 90 day supply was sent to him on 01/03 for $23. I explained that he was likely in the donut hole at the end of the year, causing the copay to rise significantly. Advised him to call back when he refills the Jardiance if the copay is still high.  ? ?Charlann Boxer, CPhT ? ?

## 2021-08-04 DIAGNOSIS — H401211 Low-tension glaucoma, right eye, mild stage: Secondary | ICD-10-CM | POA: Diagnosis not present

## 2021-08-04 DIAGNOSIS — H401133 Primary open-angle glaucoma, bilateral, severe stage: Secondary | ICD-10-CM | POA: Diagnosis not present

## 2021-08-04 DIAGNOSIS — H40023 Open angle with borderline findings, high risk, bilateral: Secondary | ICD-10-CM | POA: Diagnosis not present

## 2021-08-04 DIAGNOSIS — H44512 Absolute glaucoma, left eye: Secondary | ICD-10-CM | POA: Diagnosis not present

## 2021-08-04 DIAGNOSIS — H401131 Primary open-angle glaucoma, bilateral, mild stage: Secondary | ICD-10-CM | POA: Diagnosis not present

## 2021-08-11 DIAGNOSIS — X32XXXD Exposure to sunlight, subsequent encounter: Secondary | ICD-10-CM | POA: Diagnosis not present

## 2021-08-11 DIAGNOSIS — L570 Actinic keratosis: Secondary | ICD-10-CM | POA: Diagnosis not present

## 2021-08-16 ENCOUNTER — Other Ambulatory Visit (HOSPITAL_COMMUNITY): Payer: Self-pay | Admitting: Cardiology

## 2021-10-20 DIAGNOSIS — N184 Chronic kidney disease, stage 4 (severe): Secondary | ICD-10-CM | POA: Diagnosis not present

## 2021-10-26 ENCOUNTER — Ambulatory Visit (HOSPITAL_COMMUNITY)
Admission: RE | Admit: 2021-10-26 | Discharge: 2021-10-26 | Disposition: A | Discharge status: Home / Self Care | Payer: Medicare Other | Source: Ambulatory Visit | Attending: Family Medicine | Admitting: Family Medicine

## 2021-10-26 ENCOUNTER — Encounter (HOSPITAL_COMMUNITY): Payer: Self-pay

## 2021-10-26 VITALS — BP 140/82 | HR 79 | Wt 217.6 lb

## 2021-10-26 DIAGNOSIS — Z7902 Long term (current) use of antithrombotics/antiplatelets: Secondary | ICD-10-CM | POA: Diagnosis not present

## 2021-10-26 DIAGNOSIS — Z7984 Long term (current) use of oral hypoglycemic drugs: Secondary | ICD-10-CM | POA: Insufficient documentation

## 2021-10-26 DIAGNOSIS — Z951 Presence of aortocoronary bypass graft: Secondary | ICD-10-CM | POA: Insufficient documentation

## 2021-10-26 DIAGNOSIS — I255 Ischemic cardiomyopathy: Secondary | ICD-10-CM | POA: Insufficient documentation

## 2021-10-26 DIAGNOSIS — I130 Hypertensive heart and chronic kidney disease with heart failure and stage 1 through stage 4 chronic kidney disease, or unspecified chronic kidney disease: Secondary | ICD-10-CM | POA: Insufficient documentation

## 2021-10-26 DIAGNOSIS — Z79899 Other long term (current) drug therapy: Secondary | ICD-10-CM | POA: Insufficient documentation

## 2021-10-26 DIAGNOSIS — N1832 Chronic kidney disease, stage 3b: Secondary | ICD-10-CM

## 2021-10-26 DIAGNOSIS — I2510 Atherosclerotic heart disease of native coronary artery without angina pectoris: Secondary | ICD-10-CM | POA: Insufficient documentation

## 2021-10-26 DIAGNOSIS — Z8572 Personal history of non-Hodgkin lymphomas: Secondary | ICD-10-CM | POA: Insufficient documentation

## 2021-10-26 DIAGNOSIS — Z7982 Long term (current) use of aspirin: Secondary | ICD-10-CM | POA: Insufficient documentation

## 2021-10-26 DIAGNOSIS — I5022 Chronic systolic (congestive) heart failure: Secondary | ICD-10-CM | POA: Insufficient documentation

## 2021-10-26 DIAGNOSIS — J8489 Other specified interstitial pulmonary diseases: Secondary | ICD-10-CM | POA: Insufficient documentation

## 2021-10-26 DIAGNOSIS — Z9221 Personal history of antineoplastic chemotherapy: Secondary | ICD-10-CM | POA: Diagnosis not present

## 2021-10-26 DIAGNOSIS — I25118 Atherosclerotic heart disease of native coronary artery with other forms of angina pectoris: Secondary | ICD-10-CM | POA: Diagnosis not present

## 2021-10-26 DIAGNOSIS — E1122 Type 2 diabetes mellitus with diabetic chronic kidney disease: Secondary | ICD-10-CM | POA: Insufficient documentation

## 2021-10-26 DIAGNOSIS — I776 Arteritis, unspecified: Secondary | ICD-10-CM | POA: Insufficient documentation

## 2021-10-26 NOTE — Progress Notes (Signed)
Nephrology: Luke Heinz, MD  Cardiology: Dr. [name] HF Cardiology: Dr. Aundra Dubin  86 y.o. with history of CAD, ischemic cardiomyopathy, BOOP, pauci-immune vasculitis with CKD stage 3b, and diffuse large B cell lymphoma was referred by Dr. [name] for evaluation of CHF.  Patient had CABG in 2002.  In later years, he had PCI to native LAD and to sequential SVG-PDA/PLV.  LIMA is known to be atretic and SVG-D and sequential SVG-OM1/OM2 known to be occluded.  Most recent echo in 10/21 showed EF 35-40%.  Patient was diagnosed with pauci-immune vasculitis with glomerulonephritis by biopsy in 2008.  He is followed by Dr. Marval Regal and currently is on Imuran.  CKD stage 3b currently. He has been diagnosed by BOOP and used to be followed by pulmonary (not seen recently).  He was diagnosed with diffuse large B cell lymphoma in 3/18 and had 6 inches of his colon resected.  He was treated with chemotherapy.    Medical management of his cardiomyopathy has been limited by CKD stage 3b.    Echo 3/23 EF 35%, global hypokinesis, mildly decreased RV systolic function.   Today he returns for HF follow up. Overall feeling fair. He has dyspnea walking on flat ground and remains generally. Denies CP, dizziness, edema, or PND/Orthopnea. Appetite ok. No fever or chills. Weight at home 212-215 pounds. Taking all medications. Has been drinking a lot of V8 and salting his tomato sandwiches.  ECG (personally reviewed): NSR 76 bpm  Labs (12/21): K 4.5, creatinine 2.35, pro-BNP 1426 Labs (5/22): K 4.3, creatinine 2.8 with GFR 22 Labs (6/22): K 4.3, creatinine 2.65 with GFR 23 Labs (8/22): LDL 78 Labs (12/22): K 4.3, creatinine 2.51 Labs (3/23): K 4.1, creatinine 2.57   PMH: 1. Type 2 diabetes 2. HTN 3. Diffuse large B cell lymphoma: Diagnosed 3/18, 6 inches of colon resected and treated with chemotherapy.  4. BOOP: Followed by pulmonary  5. GERD with Barrett's esophagus.  6. Glomerulonephritis with vasculitis:  Pauci-immune vasculitis. Has had associated pulmonary hemorrhage.  - Prior treatment with Cytoxan and plasmapheresis, currently on Imuran.  - CKD stage 3b.  7. Chronic systolic CHF: Ischemic cardiomyopathy.   - Echo (9/18): EF 35-40%.  - Echo (10/21) with EF 35-40%, mild LVH, severe LAE, normal RV - Echo (3/23): EF 35%, global hypokinesis, mildly decreased RV systolic function.  8. CAD: CABG x 6 in 1/02 with LIMA-LAD, SVG-D1, seq SVG-OM1/2, seq SVG-PDA/PLV.  - NSTEMI 3/14: BMS to seq SVG-PDA/PLV, LIMA atretic but good flow down LAD, SVG-OM1/2 and SVG-D occluded.  - Angina 11/18: PCI to 80% mid LAD stenosis, seq SVG-PDA/PLV remained patent, other grafts occluded (LIMA atretic).   Social History   Socioeconomic History   Marital status: Married    Spouse name: Not on file   Number of children: Not on file   Years of education: Not on file   Highest education level: Not on file  Occupational History   Occupation: [employer]  Tobacco Use   Smoking status: Never   Smokeless tobacco: Never  Vaping Use   Vaping Use: Never used  Substance and Sexual Activity   Alcohol use: No   Drug use: No   Sexual activity: Not on file  Other Topics Concern   Not on file  Social History Narrative   Regular exercise - yes      [facility] (07/12/16):   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  no pets currently. No bird or mold exposure.   Social  Determinants of Health   Financial Resource Strain: Not on file  Food Insecurity: Not on file  Transportation Needs: Not on file  Physical Activity: Not on file  Stress: Not on file  Social Connections: Not on file  Intimate Partner Violence: Not on file   Family History  Problem Relation Age of Onset   Heart disease Mother    Diabetes Mother    Prostate cancer Father    Depression Other    Diabetes Other    Prostate cancer Other    Colon polyps Neg Hx    Colon cancer Neg Hx    Rectal cancer Neg Hx    Stomach  cancer Neg Hx    Esophageal cancer Neg Hx    ROS: All systems reviewed and negative except as per HPI.   Current Outpatient Medications  Medication Sig Dispense Refill   acetaminophen (TYLENOL) 500 MG tablet Take 500 mg by mouth daily as needed for moderate pain or headache.      aspirin EC 81 MG EC tablet Take 1 tablet (81 mg total) by mouth daily. 30 tablet    azaTHIOprine (IMURAN) 50 MG tablet Take 100 mg by mouth daily.     clopidogrel (PLAVIX) 75 MG tablet TAKE 1 TABLET BY MOUTH  DAILY 90 tablet 3   dorzolamide-timolol (COSOPT) 22.3-6.8 MG/ML ophthalmic solution Place 1 drop into both eyes 2 (two) times daily.      empagliflozin (JARDIANCE) 10 MG TABS tablet Take 1 tablet (10 mg total) by mouth daily before breakfast. 90 tablet 3   finasteride (PROSCAR) 5 MG tablet Take 5 mg by mouth at bedtime.      levothyroxine (SYNTHROID, LEVOTHROID) 100 MCG tablet Take 100 mcg by mouth daily before breakfast.     metoprolol succinate (TOPROL-XL) 50 MG 24 hr tablet Take 1.5 tablets (75 mg total) by mouth daily. 90 tablet 5   Multiple Vitamin (MULTIVITAMIN) tablet Take 1 tablet by mouth daily.     nitroGLYCERIN (NITROSTAT) 0.4 MG SL tablet Place 1 tablet (0.4 mg total) under the tongue every 5 (five) minutes as needed for chest pain. 25 tablet 3   pantoprazole (PROTONIX) 40 MG tablet TAKE 1 TABLET BY MOUTH  DAILY 90 tablet 3   rosuvastatin (CRESTOR) 10 MG tablet Take 10 mg every morning by mouth.     tamsulosin (FLOMAX) 0.4 MG CAPS capsule Take 0.4 mg by mouth at bedtime.     torsemide (DEMADEX) 20 MG tablet TAKE 2 TABLETS BY MOUTH  DAILY 180 tablet 3   No current facility-administered medications for this encounter.   Facility-Administered Medications Ordered in Other Encounters  Medication Dose Route Frequency Provider Last Rate Last Admin   sodium chloride flush (NS) 0.9 % injection 10 mL  10 mL Intracatheter PRN Brunetta Genera, MD   10 mL at 08/24/16 1653   sodium chloride flush (NS) 0.9  % injection 10 mL  10 mL Intracatheter PRN Brunetta Genera, MD   10 mL at 10/07/16 1555   Wt Readings from Last 3 Encounters:  10/26/21 98.7 kg (217 lb 9.6 oz)  07/24/21 96.1 kg (211 lb 12.8 oz)  04/22/21 99.2 kg (218 lb 12.8 oz)   BP 140/82   Pulse 79   Wt 98.7 kg (217 lb 9.6 oz)   SpO2 95%   BMI 29.51 kg/m   Physical Exam: General:  NAD. No resp difficulty, elderly, walked into clinic. HEENT: Normal Neck: Supple. No JVD. Carotids 2+ bilat; no bruits. No lymphadenopathy  or thryomegaly appreciated. Cor: PMI nondisplaced. Regular rate & rhythm. No rubs, gallops or murmurs. Lungs: Clear Abdomen: Soft, nontender, nondistended. No hepatosplenomegaly. No bruits or masses. Good bowel sounds. Extremities: No cyanosis, clubbing, rash, 1+ BLE pre-tibial edema Neuro: Alert & oriented x 3, cranial nerves grossly intact. Moves all 4 extremities w/o difficulty. Affect pleasant.   Assessment/Plan: 1. Chronic systolic CHF: Ischemic cardiomyopathy. Echo in 10/21 with EF 35-40%, comparable to prior echo in 9/18.  Echo 3/23 with EF 35%, mildly decreased RV systolic function.  CHF is complicated by CKD stage 3b which worsens fluid retention and also limits GDMT.  He appears mildly volume up on exam today, weight up 6 lbs.  Worse recently NYHA class III symptoms.   - Increase torsemide to 60 mg daily alternating with 40 mg every other day. Request recent labs from Nephrology. Repeat BMET and BNP in 10-14 days. - I asked him to cut back on his salt intake, can try low-sodium V8. - Continue Jardiance 10 mg daily. - Continue Toprol XL 75 mg daily.  - Creatinine has been too high for spironolactone when last checked, will reassess for future use.  - No ARB/ARNI yet with CKD stage 3b.   - Not ICD candidate with age, narrow QRS so would not benefit from CRT.  2. CAD: S/p CABG 1/02 with subsequent PCI to seq SVG-PDA/PLV and later to native LAD.  He denies chest pain.   - Continue ASA 81 daily.  -  Continue Crestor 10 mg daily.  Lipids ok in 7/22.  Repeat lipids next visit. 3. CKD stage 3b: From pauci-immune vasculitis with glomerulonephritis.   Follow up in 3 months with Dr. Wynema Birch [facility] FNP-BC 10/26/2021

## 2021-10-26 NOTE — Patient Instructions (Addendum)
Thank you for coming in today  INCREASE Torsemide to 40 mg daily alternating with 60 mg daily   Your physician recommends that you return for lab work in:  10-14 days   Your physician recommends that you schedule a follow-up appointment in:  3-4 weeks in APP 3 months with Dr. Aundra Dubin    Do the following things EVERYDAY: Weigh yourself in the morning before breakfast. Write it down and keep it in a log. Take your medicines as prescribed Eat low salt foods--Limit salt (sodium) to 2000 mg per day.  Stay as active as you can everyday Limit all fluids for the day to less than 2 liters  At the [facility], you and your health needs are our priority. As part of our continuing mission to provide you with exceptional heart care, we have created designated Provider Care Teams. These Care Teams include your primary Cardiologist (physician) and Advanced Practice Providers (APPs- Physician Assistants and Nurse Practitioners) who all work together to provide you with the care you need, when you need it.   You may see any of the following providers on your designated Care Team at your next follow up: Dr Glori Bickers Dr Haynes Kerns, NP Lyda Jester, [place] [facility] [place], [place] Audry Riles, PharmD   Please be sure to bring in all your medications bottles to every appointment.   If you have any questions or concerns before your next appointment please send [place] a message through [place] or call our office at [phone].    TO LEAVE A MESSAGE FOR THE NURSE SELECT OPTION 2, PLEASE LEAVE A MESSAGE INCLUDING: YOUR NAME DATE OF BIRTH CALL BACK NUMBER REASON FOR CALL**this is important as we prioritize the call backs  YOU WILL RECEIVE A CALL BACK THE SAME DAY AS LONG AS YOU CALL BEFORE 4:00 PM

## 2021-10-27 DIAGNOSIS — N184 Chronic kidney disease, stage 4 (severe): Secondary | ICD-10-CM | POA: Diagnosis not present

## 2021-10-27 DIAGNOSIS — I5042 Chronic combined systolic (congestive) and diastolic (congestive) heart failure: Secondary | ICD-10-CM | POA: Diagnosis not present

## 2021-10-27 DIAGNOSIS — N058 Unspecified nephritic syndrome with other morphologic changes: Secondary | ICD-10-CM | POA: Diagnosis not present

## 2021-10-27 DIAGNOSIS — E7800 Pure hypercholesterolemia, unspecified: Secondary | ICD-10-CM | POA: Diagnosis not present

## 2021-10-27 DIAGNOSIS — R809 Proteinuria, unspecified: Secondary | ICD-10-CM | POA: Diagnosis not present

## 2021-10-27 DIAGNOSIS — I2510 Atherosclerotic heart disease of native coronary artery without angina pectoris: Secondary | ICD-10-CM | POA: Diagnosis not present

## 2021-10-27 DIAGNOSIS — G629 Polyneuropathy, unspecified: Secondary | ICD-10-CM | POA: Diagnosis not present

## 2021-10-27 DIAGNOSIS — N400 Enlarged prostate without lower urinary tract symptoms: Secondary | ICD-10-CM | POA: Diagnosis not present

## 2021-10-27 DIAGNOSIS — N057 Unspecified nephritic syndrome with diffuse crescentic glomerulonephritis: Secondary | ICD-10-CM | POA: Diagnosis not present

## 2021-10-27 DIAGNOSIS — E1122 Type 2 diabetes mellitus with diabetic chronic kidney disease: Secondary | ICD-10-CM | POA: Diagnosis not present

## 2021-10-27 DIAGNOSIS — I129 Hypertensive chronic kidney disease with stage 1 through stage 4 chronic kidney disease, or unspecified chronic kidney disease: Secondary | ICD-10-CM | POA: Diagnosis not present

## 2021-10-27 DIAGNOSIS — N179 Acute kidney failure, unspecified: Secondary | ICD-10-CM | POA: Diagnosis not present

## 2021-11-05 ENCOUNTER — Telehealth (HOSPITAL_COMMUNITY): Payer: Self-pay | Admitting: Cardiology

## 2021-11-05 ENCOUNTER — Ambulatory Visit (HOSPITAL_COMMUNITY)
Admission: RE | Admit: 2021-11-05 | Discharge: 2021-11-05 | Disposition: A | Discharge status: Home / Self Care | Payer: Medicare Other | Source: Ambulatory Visit | Attending: Internal Medicine | Admitting: Internal Medicine

## 2021-11-05 ENCOUNTER — Telehealth (HOSPITAL_COMMUNITY): Payer: Self-pay

## 2021-11-05 DIAGNOSIS — I5022 Chronic systolic (congestive) heart failure: Secondary | ICD-10-CM | POA: Diagnosis not present

## 2021-11-05 LAB — BASIC METABOLIC PANEL
Anion gap: 9 (ref 5–15)
BUN: 53 mg/dL — ABNORMAL HIGH (ref 8–23)
CO2: 26 mmol/L (ref 22–32)
Calcium: 8.9 mg/dL (ref 8.9–10.3)
Chloride: 102 mmol/L (ref 98–111)
Creatinine, Ser: 2.84 mg/dL — ABNORMAL HIGH (ref 0.61–1.24)
GFR, Estimated: 21 mL/min — ABNORMAL LOW (ref >60)
Glucose, Bld: 117 mg/dL — ABNORMAL HIGH (ref 70–99)
Potassium: 4.2 mmol/L (ref 3.5–5.1)
Sodium: 137 mmol/L (ref 135–145)

## 2021-11-05 MED ORDER — EMPAGLIFLOZIN 10 MG PO TABS: 10.0000 mg | ORAL_TABLET | Freq: Every day | ORAL | 3 refills | Status: DC

## 2021-11-05 NOTE — Telephone Encounter (Signed)
Pt request Jardiance  10 MG refill, please send script to [facility] home delivery, Thanks

## 2021-11-05 NOTE — Telephone Encounter (Addendum)
Pt aware, agreeable, and verbalized understanding  Labs scheduled   ----- Message from Larey Dresser, MD sent at 11/05/2021 12:53 PM EDT ----- Decrease torsemide back to 40 mg daily, bmet 10 days. Keep Na low in diet.

## 2021-11-06 DIAGNOSIS — D0462 Carcinoma in situ of skin of left upper limb, including shoulder: Secondary | ICD-10-CM | POA: Diagnosis not present

## 2021-11-06 DIAGNOSIS — L570 Actinic keratosis: Secondary | ICD-10-CM | POA: Diagnosis not present

## 2021-11-06 DIAGNOSIS — C4441 Basal cell carcinoma of skin of scalp and neck: Secondary | ICD-10-CM | POA: Diagnosis not present

## 2021-11-06 DIAGNOSIS — X32XXXD Exposure to sunlight, subsequent encounter: Secondary | ICD-10-CM | POA: Diagnosis not present

## 2021-11-06 NOTE — Telephone Encounter (Signed)
Rx sent yesterday

## 2021-11-16 ENCOUNTER — Telehealth (HOSPITAL_COMMUNITY): Payer: Self-pay | Admitting: Surgery

## 2021-11-16 ENCOUNTER — Ambulatory Visit (HOSPITAL_COMMUNITY)
Admission: RE | Admit: 2021-11-16 | Discharge: 2021-11-16 | Disposition: A | Discharge status: Home / Self Care | Payer: Medicare Other | Source: Ambulatory Visit | Attending: Cardiology | Admitting: Cardiology

## 2021-11-16 DIAGNOSIS — I5022 Chronic systolic (congestive) heart failure: Secondary | ICD-10-CM | POA: Insufficient documentation

## 2021-11-16 LAB — BASIC METABOLIC PANEL
Anion gap: 11 (ref 5–15)
BUN: 46 mg/dL — ABNORMAL HIGH (ref 8–23)
CO2: 25 mmol/L (ref 22–32)
Calcium: 8.8 mg/dL — ABNORMAL LOW (ref 8.9–10.3)
Chloride: 104 mmol/L (ref 98–111)
Creatinine, Ser: 2.88 mg/dL — ABNORMAL HIGH (ref 0.61–1.24)
GFR, Estimated: 21 mL/min — ABNORMAL LOW (ref >60)
Glucose, Bld: 132 mg/dL — ABNORMAL HIGH (ref 70–99)
Potassium: 4.6 mmol/L (ref 3.5–5.1)
Sodium: 140 mmol/L (ref 135–145)

## 2021-11-16 NOTE — Telephone Encounter (Signed)
I contacted patient to review results and recommendations per provider. He was grateful for the call.

## 2021-11-16 NOTE — Telephone Encounter (Signed)
-----   Message from Larey Dresser, MD sent at 11/16/2021 11:44 AM EDT ----- BUN lower, creatinine stable.  No changes for now.

## 2021-11-24 ENCOUNTER — Encounter (HOSPITAL_COMMUNITY): Payer: Self-pay

## 2021-11-24 ENCOUNTER — Ambulatory Visit (HOSPITAL_COMMUNITY)
Admission: RE | Admit: 2021-11-24 | Discharge: 2021-11-24 | Disposition: A | Discharge status: Home / Self Care | Payer: Medicare Other | Source: Ambulatory Visit | Attending: Family Medicine | Admitting: Family Medicine

## 2021-11-24 ENCOUNTER — Ambulatory Visit (HOSPITAL_COMMUNITY)
Admission: RE | Admit: 2021-11-24 | Discharge: 2021-11-24 | Disposition: A | Discharge status: Home / Self Care | Payer: Medicare Other | Source: Ambulatory Visit | Attending: Cardiology | Admitting: Cardiology

## 2021-11-24 VITALS — BP 122/70 | HR 72 | Wt 209.6 lb

## 2021-11-24 DIAGNOSIS — Z9221 Personal history of antineoplastic chemotherapy: Secondary | ICD-10-CM | POA: Diagnosis not present

## 2021-11-24 DIAGNOSIS — Z7982 Long term (current) use of aspirin: Secondary | ICD-10-CM | POA: Diagnosis not present

## 2021-11-24 DIAGNOSIS — Z79899 Other long term (current) drug therapy: Secondary | ICD-10-CM | POA: Insufficient documentation

## 2021-11-24 DIAGNOSIS — I130 Hypertensive heart and chronic kidney disease with heart failure and stage 1 through stage 4 chronic kidney disease, or unspecified chronic kidney disease: Secondary | ICD-10-CM | POA: Insufficient documentation

## 2021-11-24 DIAGNOSIS — R609 Edema, unspecified: Secondary | ICD-10-CM | POA: Insufficient documentation

## 2021-11-24 DIAGNOSIS — Z955 Presence of coronary angioplasty implant and graft: Secondary | ICD-10-CM | POA: Insufficient documentation

## 2021-11-24 DIAGNOSIS — I5022 Chronic systolic (congestive) heart failure: Secondary | ICD-10-CM | POA: Diagnosis not present

## 2021-11-24 DIAGNOSIS — Z7984 Long term (current) use of oral hypoglycemic drugs: Secondary | ICD-10-CM | POA: Diagnosis not present

## 2021-11-24 DIAGNOSIS — E1122 Type 2 diabetes mellitus with diabetic chronic kidney disease: Secondary | ICD-10-CM | POA: Diagnosis not present

## 2021-11-24 DIAGNOSIS — N059 Unspecified nephritic syndrome with unspecified morphologic changes: Secondary | ICD-10-CM | POA: Diagnosis not present

## 2021-11-24 DIAGNOSIS — I493 Ventricular premature depolarization: Secondary | ICD-10-CM

## 2021-11-24 DIAGNOSIS — I255 Ischemic cardiomyopathy: Secondary | ICD-10-CM | POA: Diagnosis not present

## 2021-11-24 DIAGNOSIS — Z8572 Personal history of non-Hodgkin lymphomas: Secondary | ICD-10-CM | POA: Insufficient documentation

## 2021-11-24 DIAGNOSIS — N1832 Chronic kidney disease, stage 3b: Secondary | ICD-10-CM | POA: Diagnosis not present

## 2021-11-24 DIAGNOSIS — I2510 Atherosclerotic heart disease of native coronary artery without angina pectoris: Secondary | ICD-10-CM | POA: Insufficient documentation

## 2021-11-24 DIAGNOSIS — I25118 Atherosclerotic heart disease of native coronary artery with other forms of angina pectoris: Secondary | ICD-10-CM | POA: Diagnosis not present

## 2021-11-24 LAB — BASIC METABOLIC PANEL
Anion gap: 14 (ref 5–15)
BUN: 61 mg/dL — ABNORMAL HIGH (ref 8–23)
CO2: 26 mmol/L (ref 22–32)
Calcium: 8.9 mg/dL (ref 8.9–10.3)
Chloride: 99 mmol/L (ref 98–111)
Creatinine, Ser: 2.98 mg/dL — ABNORMAL HIGH (ref 0.61–1.24)
GFR, Estimated: 20 mL/min — ABNORMAL LOW (ref >60)
Glucose, Bld: 111 mg/dL — ABNORMAL HIGH (ref 70–99)
Potassium: 3.7 mmol/L (ref 3.5–5.1)
Sodium: 139 mmol/L (ref 135–145)

## 2021-11-24 LAB — LIPID PANEL
Cholesterol: 153 mg/dL (ref 0–200)
HDL: 37 mg/dL — ABNORMAL LOW (ref >40)
LDL Cholesterol: 66 mg/dL (ref 0–99)
Total CHOL/HDL Ratio: 4.1 RATIO
Triglycerides: 250 mg/dL — ABNORMAL HIGH (ref <150)
VLDL: 50 mg/dL — ABNORMAL HIGH (ref 0–40)

## 2021-11-24 LAB — MAGNESIUM: Magnesium: 2.7 mg/dL — ABNORMAL HIGH (ref 1.7–2.4)

## 2021-11-24 MED ORDER — TORSEMIDE 20 MG PO TABS: ORAL_TABLET | ORAL | 3 refills | Status: DC

## 2021-11-24 MED ORDER — NITROGLYCERIN 0.4 MG SL SUBL: 0.4000 mg | SUBLINGUAL_TABLET | SUBLINGUAL | 3 refills | Status: DC | PRN

## 2021-11-24 NOTE — Patient Instructions (Signed)
CHANGE Torsemide to 40 mg daily alternating with 20 mg daily  Labs today We will only contact you if something comes back abnormal or we need to make some changes. Otherwise no news is good news!  Your provider has recommended that  you wear a Zio Patch for 14 days.  This monitor will record your heart rhythm for our review.  IF you have any symptoms while wearing the monitor please press the button.  If you have any issues with the patch or you notice a red or orange light on it please call the company at [phone].  Once you remove the patch please mail it back to the company as soon as possible so we can get the results.   Keep cardiology follow up as scheduled with Dr Aundra Dubin  Do the following things EVERYDAY: Weigh yourself in the morning before breakfast. Write it down and keep it in a log. Take your medicines as prescribed Eat low salt foods--Limit salt (sodium) to 2000 mg per day.  Stay as active as you can everyday Limit all fluids for the day to less than 2 liters   At the [facility], you and your health needs are our priority. As part of our continuing mission to provide you with exceptional heart care, we have created designated Provider Care Teams. These Care Teams include your primary Cardiologist (physician) and Advanced Practice Providers (APPs- Physician Assistants and Nurse Practitioners) who all work together to provide you with the care you need, when you need it.   You may see any of the following providers on your designated Care Team at your next follow up: Dr Glori Bickers Dr Haynes Kerns, NP Lyda Jester, [place], [place] Audry Riles, PharmD   Please be sure to bring in all your medications bottles to every appointment.

## 2021-11-24 NOTE — Progress Notes (Signed)
Nephrology: Donato Heinz, MD  Cardiology: Dr. [name] HF Cardiology: Dr. Aundra Dubin  86 y.o. with history of CAD, ischemic cardiomyopathy, BOOP, pauci-immune vasculitis with CKD stage 3b, and diffuse large B cell lymphoma was referred by Dr. [name] for evaluation of CHF.  Patient had CABG in 2002.  In later years, he had PCI to native LAD and to sequential SVG-PDA/PLV.  LIMA is known to be atretic and SVG-D and sequential SVG-OM1/OM2 known to be occluded.  Most recent echo in 10/21 showed EF 35-40%.  Patient was diagnosed with pauci-immune vasculitis with glomerulonephritis by biopsy in 2008.  He is followed by Dr. Marval Regal and currently is on Imuran.  CKD stage 3b currently. He has been diagnosed by BOOP and used to be followed by pulmonary (not seen recently).  He was diagnosed with diffuse large B cell lymphoma in 3/18 and had 6 inches of his colon resected.  He was treated with chemotherapy.    Medical management of his cardiomyopathy has been limited by CKD stage 3b.    Echo 3/23 EF 35%, global hypokinesis, mildly decreased RV systolic function.   Today he returns for HF follow up with his daughter. Not eating well, remains weak and wakes in the middle of the night, he attributes this to panic attacks. He took Nitro 2 nights in a row for difficulty breathing during the night, without relief. He is SOB with walking short distances. He has PND and orthopnea. Denies palpitations, CP, dizziness, or edema. Appetite poor. No fever or chills. Weight at home 214-215 pounds. Taking all medications.   ReDs: 27%  ECG (personally reviewed): NSR with frequent PVCs  Labs (12/21): K 4.5, creatinine 2.35, pro-BNP 1426 Labs (5/22): K 4.3, creatinine 2.8 with GFR 22 Labs (6/22): K 4.3, creatinine 2.65 with GFR 23 Labs (8/22): LDL 78 Labs (12/22): K 4.3, creatinine 2.51 Labs (3/23): K 4.1, creatinine 2.57 Labs (7/23): K 4.6, creatinine 2.88  PMH: 1. Type 2 diabetes 2. HTN 3. Diffuse large B cell  lymphoma: Diagnosed 3/18, 6 inches of colon resected and treated with chemotherapy.  4. BOOP: Followed by pulmonary  5. GERD with Barrett's esophagus.  6. Glomerulonephritis with vasculitis: Pauci-immune vasculitis. Has had associated pulmonary hemorrhage.  - Prior treatment with Cytoxan and plasmapheresis, currently on Imuran.  - CKD stage 3b.  7. Chronic systolic CHF: Ischemic cardiomyopathy.   - Echo (9/18): EF 35-40%.  - Echo (10/21) with EF 35-40%, mild LVH, severe LAE, normal RV - Echo (3/23): EF 35%, global hypokinesis, mildly decreased RV systolic function.  8. CAD: CABG x 6 in 1/02 with LIMA-LAD, SVG-D1, seq SVG-OM1/2, seq SVG-PDA/PLV.  - NSTEMI 3/14: BMS to seq SVG-PDA/PLV, LIMA atretic but good flow down LAD, SVG-OM1/2 and SVG-D occluded.  - Angina 11/18: PCI to 80% mid LAD stenosis, seq SVG-PDA/PLV remained patent, other grafts occluded (LIMA atretic).   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  no pets currently. No bird or mold exposure.   Social Determinants of Health   Financial Resource Strain: Not on file  Food Insecurity: Not on file  Transportation Needs: Not on file  Physical Activity: Not on file  Stress: Not on file  Social Connections: Not on file  Intimate Partner Violence: Not on file   Family History  Problem Relation Age of Onset   Heart disease Mother     Diabetes Mother    Prostate cancer Father    Depression Other    Diabetes Other    Prostate cancer Other    Colon polyps Neg Hx    Colon cancer Neg Hx    Rectal cancer Neg Hx    Stomach cancer Neg Hx    Esophageal cancer Neg Hx    ROS: All systems reviewed and negative except as per HPI.   Current Outpatient Medications  Medication Sig Dispense Refill   acetaminophen (TYLENOL) 500 MG tablet Take 500 mg by mouth daily as needed for moderate pain or headache.      aspirin EC 81 MG EC tablet Take 1 tablet (81 mg total) by mouth daily. 30 tablet    azaTHIOprine (IMURAN) 50 MG tablet Take 100 mg by mouth daily.     clopidogrel (PLAVIX) 75 MG tablet TAKE 1 TABLET BY MOUTH  DAILY 90 tablet 3   dorzolamide-timolol (COSOPT) 22.3-6.8 MG/ML ophthalmic solution Place 1 drop into both eyes 2 (two) times daily.      empagliflozin (JARDIANCE) 10 MG TABS tablet Take 1 tablet (10 mg total) by mouth daily before breakfast. 90 tablet 3   finasteride (PROSCAR) 5 MG tablet Take 5 mg by mouth at bedtime.      levothyroxine (SYNTHROID, LEVOTHROID) 100 MCG tablet Take 100 mcg by mouth daily before breakfast.     metoprolol succinate (TOPROL-XL) 50 MG 24 hr tablet Take 1.5 tablets (75 mg total) by mouth daily. 90 tablet 5   Multiple Vitamin (MULTIVITAMIN) tablet Take 1 tablet by mouth daily.     nitroGLYCERIN (NITROSTAT) 0.4 MG SL tablet Place 1 tablet (0.4 mg total) under the tongue every 5 (five) minutes as needed for chest pain. 25 tablet 3   pantoprazole (PROTONIX) 40 MG tablet TAKE 1 TABLET BY MOUTH  DAILY 90 tablet 3   rosuvastatin (CRESTOR) 10 MG tablet Take 10 mg every morning by mouth.     tamsulosin (FLOMAX) 0.4 MG CAPS capsule Take 0.4 mg by mouth at bedtime.     torsemide (DEMADEX) 20 MG tablet TAKE 2 TABLETS BY MOUTH  DAILY 180 tablet 3   No current facility-administered medications for this encounter.   Facility-Administered Medications Ordered in Other Encounters  Medication Dose Route  Frequency Provider Last Rate Last Admin   sodium chloride flush (NS) 0.9 % injection 10 mL  10 mL Intracatheter PRN Brunetta Genera, MD   10 mL at 08/24/16 1653   sodium chloride flush (NS) 0.9 % injection 10 mL  10 mL Intracatheter PRN Brunetta Genera, MD   10 mL at 10/07/16 1555   Wt Readings from Last 3 Encounters:  11/24/21 95.1 kg (209 lb 9.6 oz)  10/26/21 98.7 kg (217 lb 9.6 oz)  07/24/21 96.1 kg (211 lb 12.8 oz)   BP 122/70   Pulse 72   Wt 95.1 kg (209 lb 9.6 oz)  SpO2 95%   BMI 28.43 kg/m   Physical Exam: General:  NAD. No resp difficulty, walked into clinic. Weak-appearing HEENT: Normal Neck: Supple. No JVD. Carotids 2+ bilat; no bruits. No lymphadenopathy or thryomegaly appreciated. Cor: PMI nondisplaced. Regular rate & rhythm. No rubs, gallops or murmurs. Lungs: Clear Abdomen: Soft, nontender, nondistended. No hepatosplenomegaly. No bruits or masses. Good bowel sounds. Extremities: No cyanosis, clubbing, rash, edema Neuro: Alert & oriented x 3, cranial nerves grossly intact. Moves all 4 extremities w/o difficulty. Affect pleasant.  Assessment/Plan: 1. Chronic systolic CHF: Ischemic cardiomyopathy. Echo in 10/21 with EF 35-40%, comparable to prior echo in 9/18.  Echo 3/23 with EF 35%, mildly decreased RV systolic function.  CHF is complicated by CKD stage 3b which worsens fluid retention and also limits GDMT. NYHA III-IIIb symptoms, worse symptoms recently. Weight is down 8 lbs, he appears on the dry side, REDs 27%. - Decrease torsemide to 40 mg daily alternating with 20 mg every other day. BMET/BNP today. - Continue Jardiance 10 mg daily. - Continue Toprol XL 75 mg daily.  - Creatinine has been too high for spironolactone when last checked, will reassess for future use.  - No ARB/ARNI with CKD stage 3b.   - Not ICD candidate with age, narrow QRS so would not benefit from CRT.  2. CAD: S/p CABG 1/02 with subsequent PCI to seq SVG-PDA/PLV and later to native LAD.   He denies chest pain.   - Continue ASA 81 daily.  - Continue Crestor 10 mg daily. Check lipids today. 3. CKD stage 3b: From pauci-immune vasculitis with glomerulonephritis.  4. PVCs: ? If these are causing his night time symptoms. - Place Zio 14 day to quantify.  - Consider increase in Toprol if burden is high vs amiodarone.  Follow up in 2 months with Dr. Aundra Dubin, as scheduled.  Maricela Bo [facility] FNP-BC 11/24/2021

## 2021-11-24 NOTE — Progress Notes (Signed)
ReDS Vest / Clip - 11/24/21 1100       ReDS Vest / Clip   Station Marker C    Ruler Value 29    ReDS Value Range Low volume    ReDS Actual Value 27

## 2021-11-30 ENCOUNTER — Other Ambulatory Visit: Payer: Self-pay | Admitting: Cardiovascular Disease

## 2021-12-07 DIAGNOSIS — H401131 Primary open-angle glaucoma, bilateral, mild stage: Secondary | ICD-10-CM | POA: Diagnosis not present

## 2021-12-07 DIAGNOSIS — H40023 Open angle with borderline findings, high risk, bilateral: Secondary | ICD-10-CM | POA: Diagnosis not present

## 2021-12-07 DIAGNOSIS — H44512 Absolute glaucoma, left eye: Secondary | ICD-10-CM | POA: Diagnosis not present

## 2021-12-07 DIAGNOSIS — H401133 Primary open-angle glaucoma, bilateral, severe stage: Secondary | ICD-10-CM | POA: Diagnosis not present

## 2021-12-07 DIAGNOSIS — H401211 Low-tension glaucoma, right eye, mild stage: Secondary | ICD-10-CM | POA: Diagnosis not present

## 2021-12-08 DIAGNOSIS — I493 Ventricular premature depolarization: Secondary | ICD-10-CM | POA: Diagnosis not present

## 2021-12-11 DIAGNOSIS — C44319 Basal cell carcinoma of skin of other parts of face: Secondary | ICD-10-CM | POA: Diagnosis not present

## 2021-12-11 DIAGNOSIS — Z85828 Personal history of other malignant neoplasm of skin: Secondary | ICD-10-CM | POA: Diagnosis not present

## 2021-12-11 DIAGNOSIS — X32XXXD Exposure to sunlight, subsequent encounter: Secondary | ICD-10-CM | POA: Diagnosis not present

## 2021-12-11 DIAGNOSIS — Z08 Encounter for follow-up examination after completed treatment for malignant neoplasm: Secondary | ICD-10-CM | POA: Diagnosis not present

## 2021-12-11 DIAGNOSIS — C4441 Basal cell carcinoma of skin of scalp and neck: Secondary | ICD-10-CM | POA: Diagnosis not present

## 2021-12-11 DIAGNOSIS — L570 Actinic keratosis: Secondary | ICD-10-CM | POA: Diagnosis not present

## 2021-12-18 ENCOUNTER — Telehealth (HOSPITAL_COMMUNITY): Payer: Self-pay | Admitting: Surgery

## 2021-12-18 MED ORDER — AMIODARONE HCL 200 MG PO TABS: 200.0000 mg | ORAL_TABLET | Freq: Every day | ORAL | 6 refills | Status: DC

## 2021-12-18 MED ORDER — AMIODARONE HCL 200 MG PO TABS: 200.0000 mg | ORAL_TABLET | Freq: Two times a day (BID) | ORAL | 0 refills | Status: DC

## 2021-12-18 NOTE — Telephone Encounter (Signed)
-----   Message from Jerl Mina, RN sent at 12/18/2021 10:21 AM EDT -----  ----- Message ----- From: Larey Dresser, MD Sent: 12/13/2021   9:22 PM EDT To: Jerl Mina, RN; [facility] Triage Pool  Frequent PVCs and NSVT.  Would start amiodarone 200 mg bid x 10 days then 200 mg daily to try to suppress.

## 2021-12-18 NOTE — Telephone Encounter (Signed)
I called patient to review results and recommendations per provider.  Per his request I sent 10 day 200 mg BID prescription to [facility] and ongoing 200 mg daily prescription to [facility].  Medlist updated in [facility].

## 2021-12-24 ENCOUNTER — Other Ambulatory Visit: Payer: Self-pay | Admitting: Cardiovascular Disease

## 2021-12-24 NOTE — Telephone Encounter (Signed)
This is a CHF pt 

## 2021-12-30 ENCOUNTER — Other Ambulatory Visit (HOSPITAL_COMMUNITY): Payer: Self-pay | Admitting: Pharmacist

## 2021-12-30 MED ORDER — AMIODARONE HCL 200 MG PO TABS: 200.0000 mg | ORAL_TABLET | Freq: Every day | ORAL | 11 refills | Status: DC

## 2021-12-31 ENCOUNTER — Other Ambulatory Visit (HOSPITAL_COMMUNITY): Payer: Self-pay | Admitting: Pharmacist

## 2021-12-31 MED ORDER — AMIODARONE HCL 200 MG PO TABS
200.0000 mg | ORAL_TABLET | Freq: Every day | ORAL | 3 refills | Status: DC
Start: 2021-12-31 — End: 2022-11-23

## 2022-01-25 DIAGNOSIS — N184 Chronic kidney disease, stage 4 (severe): Secondary | ICD-10-CM | POA: Diagnosis not present

## 2022-01-27 DIAGNOSIS — N058 Unspecified nephritic syndrome with other morphologic changes: Secondary | ICD-10-CM | POA: Diagnosis not present

## 2022-01-27 DIAGNOSIS — N179 Acute kidney failure, unspecified: Secondary | ICD-10-CM | POA: Diagnosis not present

## 2022-01-27 DIAGNOSIS — R809 Proteinuria, unspecified: Secondary | ICD-10-CM | POA: Diagnosis not present

## 2022-01-27 DIAGNOSIS — N057 Unspecified nephritic syndrome with diffuse crescentic glomerulonephritis: Secondary | ICD-10-CM | POA: Diagnosis not present

## 2022-01-27 DIAGNOSIS — E7800 Pure hypercholesterolemia, unspecified: Secondary | ICD-10-CM | POA: Diagnosis not present

## 2022-01-27 DIAGNOSIS — I129 Hypertensive chronic kidney disease with stage 1 through stage 4 chronic kidney disease, or unspecified chronic kidney disease: Secondary | ICD-10-CM | POA: Diagnosis not present

## 2022-01-27 DIAGNOSIS — N400 Enlarged prostate without lower urinary tract symptoms: Secondary | ICD-10-CM | POA: Diagnosis not present

## 2022-01-27 DIAGNOSIS — I2510 Atherosclerotic heart disease of native coronary artery without angina pectoris: Secondary | ICD-10-CM | POA: Diagnosis not present

## 2022-01-27 DIAGNOSIS — N184 Chronic kidney disease, stage 4 (severe): Secondary | ICD-10-CM | POA: Diagnosis not present

## 2022-01-27 DIAGNOSIS — I5042 Chronic combined systolic (congestive) and diastolic (congestive) heart failure: Secondary | ICD-10-CM | POA: Diagnosis not present

## 2022-01-27 DIAGNOSIS — G629 Polyneuropathy, unspecified: Secondary | ICD-10-CM | POA: Diagnosis not present

## 2022-01-27 DIAGNOSIS — E1129 Type 2 diabetes mellitus with other diabetic kidney complication: Secondary | ICD-10-CM | POA: Diagnosis not present

## 2022-02-02 ENCOUNTER — Ambulatory Visit (HOSPITAL_COMMUNITY)
Admission: RE | Admit: 2022-02-02 | Discharge: 2022-02-02 | Disposition: A | Discharge status: Home / Self Care | Payer: Medicare Other | Source: Ambulatory Visit | Attending: Cardiology | Admitting: Cardiology

## 2022-02-02 ENCOUNTER — Other Ambulatory Visit (HOSPITAL_COMMUNITY): Payer: Self-pay

## 2022-02-02 ENCOUNTER — Encounter (HOSPITAL_COMMUNITY): Payer: Self-pay | Admitting: Cardiology

## 2022-02-02 VITALS — BP 128/78 | HR 64 | Wt 213.4 lb

## 2022-02-02 DIAGNOSIS — Z8249 Family history of ischemic heart disease and other diseases of the circulatory system: Secondary | ICD-10-CM | POA: Insufficient documentation

## 2022-02-02 DIAGNOSIS — R9431 Abnormal electrocardiogram [ECG] [EKG]: Secondary | ICD-10-CM | POA: Diagnosis not present

## 2022-02-02 DIAGNOSIS — I776 Arteritis, unspecified: Secondary | ICD-10-CM | POA: Insufficient documentation

## 2022-02-02 DIAGNOSIS — Z955 Presence of coronary angioplasty implant and graft: Secondary | ICD-10-CM | POA: Diagnosis not present

## 2022-02-02 DIAGNOSIS — I493 Ventricular premature depolarization: Secondary | ICD-10-CM | POA: Diagnosis not present

## 2022-02-02 DIAGNOSIS — I2510 Atherosclerotic heart disease of native coronary artery without angina pectoris: Secondary | ICD-10-CM | POA: Diagnosis not present

## 2022-02-02 DIAGNOSIS — K219 Gastro-esophageal reflux disease without esophagitis: Secondary | ICD-10-CM | POA: Diagnosis not present

## 2022-02-02 DIAGNOSIS — Z7982 Long term (current) use of aspirin: Secondary | ICD-10-CM | POA: Diagnosis not present

## 2022-02-02 DIAGNOSIS — Z79899 Other long term (current) drug therapy: Secondary | ICD-10-CM | POA: Insufficient documentation

## 2022-02-02 DIAGNOSIS — N1832 Chronic kidney disease, stage 3b: Secondary | ICD-10-CM | POA: Diagnosis not present

## 2022-02-02 DIAGNOSIS — Z79890 Hormone replacement therapy (postmenopausal): Secondary | ICD-10-CM | POA: Diagnosis not present

## 2022-02-02 DIAGNOSIS — I5022 Chronic systolic (congestive) heart failure: Secondary | ICD-10-CM | POA: Diagnosis not present

## 2022-02-02 DIAGNOSIS — Z7902 Long term (current) use of antithrombotics/antiplatelets: Secondary | ICD-10-CM | POA: Insufficient documentation

## 2022-02-02 DIAGNOSIS — I252 Old myocardial infarction: Secondary | ICD-10-CM | POA: Diagnosis not present

## 2022-02-02 DIAGNOSIS — Z951 Presence of aortocoronary bypass graft: Secondary | ICD-10-CM | POA: Diagnosis not present

## 2022-02-02 DIAGNOSIS — Z79631 Long term (current) use of antimetabolite agent: Secondary | ICD-10-CM | POA: Insufficient documentation

## 2022-02-02 DIAGNOSIS — R600 Localized edema: Secondary | ICD-10-CM | POA: Diagnosis not present

## 2022-02-02 DIAGNOSIS — I440 Atrioventricular block, first degree: Secondary | ICD-10-CM | POA: Insufficient documentation

## 2022-02-02 DIAGNOSIS — I255 Ischemic cardiomyopathy: Secondary | ICD-10-CM | POA: Insufficient documentation

## 2022-02-02 DIAGNOSIS — I130 Hypertensive heart and chronic kidney disease with heart failure and stage 1 through stage 4 chronic kidney disease, or unspecified chronic kidney disease: Secondary | ICD-10-CM | POA: Diagnosis not present

## 2022-02-02 DIAGNOSIS — E1122 Type 2 diabetes mellitus with diabetic chronic kidney disease: Secondary | ICD-10-CM | POA: Diagnosis not present

## 2022-02-02 DIAGNOSIS — Z9221 Personal history of antineoplastic chemotherapy: Secondary | ICD-10-CM | POA: Insufficient documentation

## 2022-02-02 DIAGNOSIS — Z8719 Personal history of other diseases of the digestive system: Secondary | ICD-10-CM | POA: Insufficient documentation

## 2022-02-02 DIAGNOSIS — Z79624 Long term (current) use of inhibitors of nucleotide synthesis: Secondary | ICD-10-CM | POA: Insufficient documentation

## 2022-02-02 DIAGNOSIS — Z7984 Long term (current) use of oral hypoglycemic drugs: Secondary | ICD-10-CM | POA: Diagnosis not present

## 2022-02-02 DIAGNOSIS — Z8572 Personal history of non-Hodgkin lymphomas: Secondary | ICD-10-CM | POA: Insufficient documentation

## 2022-02-02 LAB — COMPREHENSIVE METABOLIC PANEL
ALT: 16 U/L (ref 0–44)
AST: 20 U/L (ref 15–41)
Albumin: 3.6 g/dL (ref 3.5–5.0)
Alkaline Phosphatase: 46 U/L (ref 38–126)
Anion gap: 13 (ref 5–15)
BUN: 48 mg/dL — ABNORMAL HIGH (ref 8–23)
CO2: 21 mmol/L — ABNORMAL LOW (ref 22–32)
Calcium: 8.6 mg/dL — ABNORMAL LOW (ref 8.9–10.3)
Chloride: 104 mmol/L (ref 98–111)
Creatinine, Ser: 3.11 mg/dL — ABNORMAL HIGH (ref 0.61–1.24)
GFR, Estimated: 19 mL/min — ABNORMAL LOW (ref >60)
Glucose, Bld: 97 mg/dL (ref 70–99)
Potassium: 4.5 mmol/L (ref 3.5–5.1)
Sodium: 138 mmol/L (ref 135–145)
Total Bilirubin: 0.8 mg/dL (ref 0.3–1.2)
Total Protein: 6.9 g/dL (ref 6.5–8.1)

## 2022-02-02 LAB — TSH: TSH: 2.487 u[IU]/mL (ref 0.350–4.500)

## 2022-02-02 MED ORDER — VERQUVO 2.5 MG PO TABS: 2.5000 mg | ORAL_TABLET | Freq: Every day | ORAL | 11 refills | Status: DC

## 2022-02-02 MED ORDER — TORSEMIDE 20 MG PO TABS: 40.0000 mg | ORAL_TABLET | Freq: Every day | ORAL | 3 refills | Status: DC

## 2022-02-02 NOTE — Progress Notes (Signed)
Nephrology: Donato Heinz, MD  Cardiology: Dr. [name] HF Cardiology: Dr. Aundra Dubin  86 y.o. with history of CAD, ischemic cardiomyopathy, BOOP, pauci-immune vasculitis with CKD stage 3b, and diffuse large B cell lymphoma was referred by Dr. [name] for evaluation of CHF.  Patient had CABG in 2002.  In later years, he had PCI to native LAD and to sequential SVG-PDA/PLV.  LIMA is known to be atretic and SVG-D and sequential SVG-OM1/OM2 known to be occluded.  Most recent echo in 10/21 showed EF 35-40%.  Patient was diagnosed with pauci-immune vasculitis with glomerulonephritis by biopsy in 2008.  He is followed by Dr. Marval Regal and currently is on Imuran.  CKD stage 3b currently. He has been diagnosed by BOOP and used to be followed by pulmonary (not seen recently).  He was diagnosed with diffuse large B cell lymphoma in 3/18 and had 6 inches of his colon resected.  He was treated with chemotherapy.    Medical management of his cardiomyopathy has been limited by CKD stage 3b.    Echo 3/23 EF 35%, global hypokinesis, mildly decreased RV systolic function. Zio monitor in 7/23 showed 11.3% PVCs and 6 runs NSVT, amiodarone was started.    Today he returns for HF follow up with his daughter. He is short of breath after walking about 200-300 feet.  Generally does ok walking around the house.  He is short of breath with stairs and hills.  No chest pain, no NTG use.  Mild orthopnea.  No lightheadedness or palpitations.  Left lower leg is swollen > right lower leg.   ReDs: 36%  ECG (personally reviewed): NSR, 1st degree AVB, nonspecific T wave flattening  Labs (12/21): K 4.5, creatinine 2.35, pro-BNP 1426 Labs (5/22): K 4.3, creatinine 2.8 with GFR 22 Labs (6/22): K 4.3, creatinine 2.65 with GFR 23 Labs (8/22): LDL 78 Labs (12/22): K 4.3, creatinine 2.51 Labs (3/23): K 4.1, creatinine 2.57 Labs (7/23): K 4.6, creatinine 2.88 => 2.98, LDL 66, TGs 250  PMH: 1. Type 2 diabetes 2. HTN 3. Diffuse large B  cell lymphoma: Diagnosed 3/18, 6 inches of colon resected and treated with chemotherapy.  4. BOOP: Followed by pulmonary  5. GERD with Barrett's esophagus.  6. Glomerulonephritis with vasculitis: Pauci-immune vasculitis. Has had associated pulmonary hemorrhage.  - Prior treatment with Cytoxan and plasmapheresis, currently on Imuran.  - CKD stage 3b.  7. Chronic systolic CHF: Ischemic cardiomyopathy.   - Echo (9/18): EF 35-40%.  - Echo (10/21) with EF 35-40%, mild LVH, severe LAE, normal RV - Echo (3/23): EF 35%, global hypokinesis, mildly decreased RV systolic function.  8. CAD: CABG x 6 in 1/02 with LIMA-LAD, SVG-D1, seq SVG-OM1/2, seq SVG-PDA/PLV.  - NSTEMI 3/14: BMS to seq SVG-PDA/PLV, LIMA atretic but good flow down LAD, SVG-OM1/2 and SVG-D occluded.  - Angina 11/18: PCI to 80% mid LAD stenosis, seq SVG-PDA/PLV remained patent, other grafts occluded (LIMA atretic).  9. PVCs: Zio in 7/23 showed 11.3% PVCs and 6 runs NSVT.   Social History   Socioeconomic History   Marital status: Married    Spouse name: Not on file   Number of children: Not on file   Years of education: Not on file   Highest education level: Not on file  Occupational History   Occupation: [employer]  Tobacco Use   Smoking status: Never   Smokeless tobacco: Never  Vaping Use   Vaping Use: Never used  Substance and Sexual Activity   Alcohol use: No   Drug use:  No   Sexual activity: Not on file  Other Topics Concern   Not on file  Social History Narrative   Regular exercise - yes      [facility] (07/12/16):   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  no pets currently. No bird or mold exposure.   Social Determinants of Health   Financial Resource Strain: Not on file  Food Insecurity: Not on file  Transportation Needs: Not on file  Physical Activity: Not on file  Stress: Not on file  Social Connections: Not on file  Intimate Partner Violence: Not on file   Family History   Problem Relation Age of Onset   Heart disease Mother    Diabetes Mother    Prostate cancer Father    Depression Other    Diabetes Other    Prostate cancer Other    Colon polyps Neg Hx    Colon cancer Neg Hx    Rectal cancer Neg Hx    Stomach cancer Neg Hx    Esophageal cancer Neg Hx    ROS: All systems reviewed and negative except as per HPI.   Current Outpatient Medications  Medication Sig Dispense Refill   acetaminophen (TYLENOL) 500 MG tablet Take 500 mg by mouth daily as needed for moderate pain or headache.      amiodarone (PACERONE) 200 MG tablet Take 1 tablet (200 mg total) by mouth daily. 90 tablet 3   aspirin EC 81 MG EC tablet Take 1 tablet (81 mg total) by mouth daily. 30 tablet    azaTHIOprine (IMURAN) 50 MG tablet Take 100 mg by mouth daily.     clopidogrel (PLAVIX) 75 MG tablet TAKE 1 TABLET BY MOUTH  DAILY 90 tablet 3   dorzolamide-timolol (COSOPT) 22.3-6.8 MG/ML ophthalmic solution Place 1 drop into both eyes 2 (two) times daily.      empagliflozin (JARDIANCE) 10 MG TABS tablet Take 1 tablet (10 mg total) by mouth daily before breakfast. 90 tablet 3   finasteride (PROSCAR) 5 MG tablet Take 5 mg by mouth at bedtime.      levothyroxine (SYNTHROID, LEVOTHROID) 100 MCG tablet Take 100 mcg by mouth daily before breakfast.     metoprolol succinate (TOPROL-XL) 50 MG 24 hr tablet Take 1.5 tablets (75 mg total) by mouth daily. 90 tablet 5   Multiple Vitamin (MULTIVITAMIN) tablet Take 1 tablet by mouth daily.     nitroGLYCERIN (NITROSTAT) 0.4 MG SL tablet Place 1 tablet (0.4 mg total) under the tongue every 5 (five) minutes as needed for chest pain. 25 tablet 3   pantoprazole (PROTONIX) 40 MG tablet TAKE 1 TABLET BY MOUTH  DAILY 90 tablet 3   rosuvastatin (CRESTOR) 10 MG tablet Take 10 mg every morning by mouth.     tamsulosin (FLOMAX) 0.4 MG CAPS capsule Take 0.4 mg by mouth at bedtime.     Vericiguat (VERQUVO) 2.5 MG TABS Take 2.5 mg by mouth daily. 30 tablet 11   torsemide  (DEMADEX) 20 MG tablet Take 2 tablets (40 mg total) by mouth daily. 180 tablet 3   No current facility-administered medications for this encounter.   Facility-Administered Medications Ordered in Other Encounters  Medication Dose Route Frequency Provider Last Rate Last Admin   sodium chloride flush (NS) 0.9 % injection 10 mL  10 mL Intracatheter PRN Brunetta Genera, MD   10 mL at 08/24/16 1653   sodium chloride flush (NS) 0.9 % injection 10 mL  10 mL Intracatheter PRN  Brunetta Genera, MD   10 mL at 10/07/16 1555   Wt Readings from Last 3 Encounters:  02/02/22 96.8 kg (213 lb 6.4 oz)  11/24/21 95.1 kg (209 lb 9.6 oz)  10/26/21 98.7 kg (217 lb 9.6 oz)   BP 128/78   Pulse 64   Wt 96.8 kg (213 lb 6.4 oz)   SpO2 98%   BMI 28.94 kg/m  General: NAD Neck: JVP 8 cm, no thyromegaly or thyroid nodule.  Lungs: Clear to auscultation bilaterally with normal respiratory effort. CV: Nondisplaced PMI.  Heart regular S1/S2, no S3/S4, no murmur.  2+ edema 1/2 to knee on left leg, 1+ ankle edema on right.  No carotid bruit.  Normal pedal pulses.  Abdomen: Soft, nontender, no hepatosplenomegaly, no distention.  Skin: Intact without lesions or rashes.  Neurologic: Alert and oriented x 3.  Psych: Normal affect. Extremities: No clubbing or cyanosis.  HEENT: Normal.   Assessment/Plan: 1. Chronic systolic CHF: Ischemic cardiomyopathy. Echo in 10/21 with EF 35-40%, comparable to prior echo in 9/18.  Echo 3/23 with EF 35%, mildly decreased RV systolic function.  CHF is complicated by CKD stage 3b which worsens fluid retention and also limits GDMT. NYHA III symptoms with mild volume overload on exam and by REDS clip 36%.  - Increase torsemide to 40 mg daily with BMET/BNP today and BMET in 10 days. - Continue Jardiance 10 mg daily. - Continue Toprol XL 75 mg daily.  - Creatinine has been too high for spironolactone.  - Add Verquvo 2.5 mg daily, this should not appreciably interact with abnormal renal  function.  - No ARB/ARNI with CKD stage 3b.   - Not ICD candidate with age, narrow QRS so would not benefit from CRT.  2. CAD: S/p CABG 1/02 with subsequent PCI to seq SVG-PDA/PLV and later to native LAD.  He denies chest pain.   - Continue ASA 81 daily.  - Continue Crestor 10 mg daily. Good lipids in 7/23.  3. CKD stage 3b: From pauci-immune vasculitis with glomerulonephritis.  4. PVCs: Zio in 7/23 with 11% PVCs and 6 NSVT runs.  - Continue amiodarone 200 mg daily.  Check LFTs/TSH today, he will need regular eye exam. 5. Left lower leg asymmetric edema: I will arrange for lower extremity venous doppler to evaluate for DVT.   Follow up in 2 months with APP.  Loralie Champagne  02/02/2022

## 2022-02-02 NOTE — Patient Instructions (Signed)
Start Verquvo 2.'5mg'$  daily.  Labs done today, your results will be available in MyChart, we will contact you for abnormal readings.  Your provider has ordered a ultra sound of your leg.  You will be called to have this appointment scheduled.   Your physician recommends that you schedule a follow-up appointment in: 2 months   If you have any questions or concerns before your next appointment please send [place] a message through [place] or call our office at [phone].    TO LEAVE A MESSAGE FOR THE NURSE SELECT OPTION 2, PLEASE LEAVE A MESSAGE INCLUDING: YOUR NAME DATE OF BIRTH CALL BACK NUMBER REASON FOR CALL**this is important as we prioritize the call backs  YOU WILL RECEIVE A CALL BACK THE SAME DAY AS LONG AS YOU CALL BEFORE 4:00 PM  At the [facility], you and your health needs are our priority. As part of our continuing mission to provide you with exceptional heart care, we have created designated Provider Care Teams. These Care Teams include your primary Cardiologist (physician) and Advanced Practice Providers (APPs- Physician Assistants and Nurse Practitioners) who all work together to provide you with the care you need, when you need it.   You may see any of the following providers on your designated Care Team at your next follow up: Dr Glori Bickers Dr Loralie Champagne Dr. Roxana Hires, NP Lyda Jester, [place] [facility] [place], [place] Forestine Na, NP Audry Riles, PharmD   Please be sure to bring in all your medications bottles to every appointment.

## 2022-02-02 NOTE — Progress Notes (Signed)
ReDS Vest / Clip - 02/02/22 1100       ReDS Vest / Clip   Station Marker C    Ruler Value 29    ReDS Value Range Moderate volume overload    ReDS Actual Value 36

## 2022-02-03 ENCOUNTER — Telehealth (HOSPITAL_COMMUNITY): Payer: Self-pay

## 2022-02-03 DIAGNOSIS — I5022 Chronic systolic (congestive) heart failure: Secondary | ICD-10-CM

## 2022-02-03 NOTE — Telephone Encounter (Addendum)
  Pt aware, agreeable, and verbalized understanding   ----- Message from Larey Dresser, MD sent at 02/02/2022  4:22 PM EDT ----- BMET next week to make sure creatinine not trending up.

## 2022-02-04 ENCOUNTER — Other Ambulatory Visit (HOSPITAL_COMMUNITY): Payer: Self-pay

## 2022-02-04 ENCOUNTER — Encounter (HOSPITAL_COMMUNITY): Payer: Self-pay | Admitting: *Deleted

## 2022-02-04 ENCOUNTER — Telehealth: Payer: Self-pay | Admitting: Licensed Clinical Social Worker

## 2022-02-04 ENCOUNTER — Telehealth (HOSPITAL_COMMUNITY): Payer: Self-pay | Admitting: Pharmacy Technician

## 2022-02-04 ENCOUNTER — Encounter: Payer: Self-pay | Admitting: Hematology

## 2022-02-04 ENCOUNTER — Ambulatory Visit (HOSPITAL_COMMUNITY)
Admission: RE | Admit: 2022-02-04 | Discharge: 2022-02-04 | Disposition: A | Discharge status: Home / Self Care | Payer: Medicare Other | Source: Ambulatory Visit | Attending: Cardiovascular Disease | Admitting: Cardiovascular Disease

## 2022-02-04 DIAGNOSIS — R2242 Localized swelling, mass and lump, left lower limb: Secondary | ICD-10-CM

## 2022-02-04 DIAGNOSIS — I5022 Chronic systolic (congestive) heart failure: Secondary | ICD-10-CM | POA: Diagnosis not present

## 2022-02-04 MED ORDER — VERQUVO 2.5 MG PO TABS
2.5000 mg | ORAL_TABLET | Freq: Every day | ORAL | 3 refills | Status: DC
  Filled 2022-02-04: qty 90, 90d supply, fill #0

## 2022-02-04 MED ORDER — EMPAGLIFLOZIN 10 MG PO TABS
10.0000 mg | ORAL_TABLET | Freq: Every day | ORAL | 3 refills | Status: DC
  Filled 2022-02-04 – 2022-05-05 (×2): qty 90, 90d supply, fill #0
  Filled 2022-07-29: qty 90, 90d supply, fill #1
  Filled 2022-10-27: qty 90, 90d supply, fill #2

## 2022-02-04 NOTE — Telephone Encounter (Signed)
Advanced Heart Failure Patient Advocate Encounter  Prior Authorization for [place] has been approved.    PA# [id number]  Effective dates: 02/04/22 through 05/16/22  Patients co-pay is $171.83 (30 days), $514.83 (90 days)  Called and spoke with the patient. Sent 90 day RX request to [place] ([place]) to send to [employer] for mailing.   Charlann Boxer, CPhT

## 2022-02-04 NOTE — Telephone Encounter (Signed)
Patient Advocate Encounter   Received notification from [facility] that prior authorization for Luke Mcbride is required.   PA submitted on CoverMyMeds Key [id number] Status is pending   Will continue to follow.

## 2022-02-04 NOTE — Telephone Encounter (Signed)
Advanced Heart Failure Patient Advocate Encounter  The patient was approved for a [employer] grant that will help cover the cost of Verquvo, Jardiance. Total amount awarded, $10,000. Eligibility, 01/03/22 - 01/03/23.  ID [id number]  BIN [id number]  PCN [id number]  Group [id number]  Emailed a copy of the grant information to the patient. Entered billing information into [facility]. Sent 90 day RX request to [place] ([facility]) to send to [employer]. Of note, patient is over the income for patient assistance. Grants would be his only option, unless income decreases.  Charlann Boxer, CPhT

## 2022-02-04 NOTE — Progress Notes (Signed)
Heart and Vascular Care Navigation  02/04/2022  Luke Mcbride 1935-09-24 [id number]  Reason for Referral: Extra Help Patient is participating in a Managed Medicaid Plan: No, Traditional Medicare and [place] with patient by telephone for initial visit for Heart and Vascular Care Coordination.                                                                                                   Assessment:                                     LCSW spoke with pt via telephone today. Was able to reach him at cell phone [phone], pt shares his home phone usually is better [phone], I will make that now primary). LCSW introduced self, role, reason for call. Pt confirmed home address, lives with his wife. He does not currently have a PCP provider. We discussed importance of PCP for general management, offered to send information about [facility] for pt to connect with if interested. Pt denies any issues with housing or utility costs, affording or obtaining food, or transportation.   Pt shares that he does sometimes have higher costs of medications. We discussed he and his wife's current incomes and although I do think they are over the amount eligible for program I inquired if pt was interested in completed Extra Help program or referral to [facility] program. Pt declines at this time, he is okay with me sending information about that program to him and they can decide if they would like to utilize it for assistance.   He did have one question about a medication ordered by AHF team, [employer] had called him with issues. I have forwarded this to Wray Kearns, to see if she can further assist. No additional questions/concerns.   HRT/VAS Care Coordination     Patients Home Cardiology Office Heart Failure Clinic   Outpatient Care Team Social Worker   Social Worker Name: Valeda Malm, [place] [facility] [phone]   Living arrangements for the past 2 months Single  Family Home   Lives with: Spouse   Patient Current Insurance Coverage Traditional Medicare  AARP supplement   Patient Has Concern With Paying Medical Bills No   Does Patient Have Prescription Coverage? Yes   Home Assistive Devices/Equipment None   DME [place] History:                                                                             SDOH Screenings   Food Insecurity: No Food Insecurity (02/04/2022)  Housing: Low Risk  (02/04/2022)  Transportation Needs: No Transportation Needs (02/04/2022)  Utilities: Not At Risk (  02/04/2022)  Financial Resource Strain: Low Risk  (02/04/2022)  Tobacco Use: Low Risk  (02/02/2022)    SDOH Interventions: Financial Resources:  Financial Strain Interventions: Intervention Not Indicated  Food Insecurity:  Food Insecurity Interventions: Intervention Not Indicated  Housing Insecurity:  Housing Interventions: Intervention Not Indicated  Transportation:   Transportation Interventions: Intervention Not Indicated    Other Care Navigation Interventions:     Provided Pharmacy assistance resources  Mailed pt information about [facility] and Extra Help program   Follow-up plan:   LCSW mailed pt the following: my card, Tammy Sours- AHF LCSW's number, PCP list, SHIIP flyer and Extra Help information. I remain available should additional questions/concerns/assistance w/ Extra Help be shared.

## 2022-02-05 ENCOUNTER — Other Ambulatory Visit (HOSPITAL_COMMUNITY): Payer: Self-pay

## 2022-02-10 ENCOUNTER — Other Ambulatory Visit (HOSPITAL_COMMUNITY): Payer: Self-pay | Admitting: *Deleted

## 2022-02-10 ENCOUNTER — Ambulatory Visit (HOSPITAL_COMMUNITY)
Admission: RE | Admit: 2022-02-10 | Discharge: 2022-02-10 | Disposition: A | Discharge status: Home / Self Care | Payer: Medicare Other | Source: Ambulatory Visit | Attending: Internal Medicine | Admitting: Internal Medicine

## 2022-02-10 ENCOUNTER — Other Ambulatory Visit (HOSPITAL_COMMUNITY): Payer: Self-pay

## 2022-02-10 DIAGNOSIS — I5022 Chronic systolic (congestive) heart failure: Secondary | ICD-10-CM

## 2022-02-10 MED ORDER — METOPROLOL SUCCINATE ER 50 MG PO TB24
75.0000 mg | ORAL_TABLET | Freq: Every day | ORAL | 3 refills | Status: DC
  Filled 2022-02-10: qty 135, 90d supply, fill #0
  Filled 2022-05-13: qty 135, 90d supply, fill #1
  Filled 2022-08-06: qty 135, 90d supply, fill #2
  Filled 2022-11-04: qty 135, 90d supply, fill #3

## 2022-02-24 DIAGNOSIS — M47816 Spondylosis without myelopathy or radiculopathy, lumbar region: Secondary | ICD-10-CM | POA: Diagnosis not present

## 2022-03-09 DIAGNOSIS — M47816 Spondylosis without myelopathy or radiculopathy, lumbar region: Secondary | ICD-10-CM | POA: Diagnosis not present

## 2022-03-12 ENCOUNTER — Inpatient Hospital Stay: Payer: Medicare Other

## 2022-03-12 ENCOUNTER — Inpatient Hospital Stay: Payer: Medicare Other | Attending: Hematology | Admitting: Hematology

## 2022-03-12 VITALS — BP 114/75 | HR 77 | Temp 97.8°F | Resp 18 | Ht 72.0 in | Wt 211.2 lb

## 2022-03-12 DIAGNOSIS — I2510 Atherosclerotic heart disease of native coronary artery without angina pectoris: Secondary | ICD-10-CM | POA: Diagnosis not present

## 2022-03-12 DIAGNOSIS — I700 Atherosclerosis of aorta: Secondary | ICD-10-CM | POA: Insufficient documentation

## 2022-03-12 DIAGNOSIS — K219 Gastro-esophageal reflux disease without esophagitis: Secondary | ICD-10-CM | POA: Diagnosis not present

## 2022-03-12 DIAGNOSIS — Z8719 Personal history of other diseases of the digestive system: Secondary | ICD-10-CM | POA: Insufficient documentation

## 2022-03-12 DIAGNOSIS — Z8249 Family history of ischemic heart disease and other diseases of the circulatory system: Secondary | ICD-10-CM | POA: Diagnosis not present

## 2022-03-12 DIAGNOSIS — C8333 Diffuse large B-cell lymphoma, intra-abdominal lymph nodes: Secondary | ICD-10-CM | POA: Insufficient documentation

## 2022-03-12 DIAGNOSIS — C8338 Diffuse large B-cell lymphoma, lymph nodes of multiple sites: Secondary | ICD-10-CM

## 2022-03-12 DIAGNOSIS — Z7902 Long term (current) use of antithrombotics/antiplatelets: Secondary | ICD-10-CM | POA: Diagnosis not present

## 2022-03-12 DIAGNOSIS — E785 Hyperlipidemia, unspecified: Secondary | ICD-10-CM | POA: Insufficient documentation

## 2022-03-12 DIAGNOSIS — Z79631 Long term (current) use of antimetabolite agent: Secondary | ICD-10-CM | POA: Diagnosis not present

## 2022-03-12 DIAGNOSIS — Z79899 Other long term (current) drug therapy: Secondary | ICD-10-CM | POA: Diagnosis not present

## 2022-03-12 DIAGNOSIS — I130 Hypertensive heart and chronic kidney disease with heart failure and stage 1 through stage 4 chronic kidney disease, or unspecified chronic kidney disease: Secondary | ICD-10-CM | POA: Diagnosis not present

## 2022-03-12 DIAGNOSIS — Z833 Family history of diabetes mellitus: Secondary | ICD-10-CM | POA: Diagnosis not present

## 2022-03-12 DIAGNOSIS — N183 Chronic kidney disease, stage 3 unspecified: Secondary | ICD-10-CM | POA: Diagnosis not present

## 2022-03-12 DIAGNOSIS — Z818 Family history of other mental and behavioral disorders: Secondary | ICD-10-CM | POA: Diagnosis not present

## 2022-03-12 DIAGNOSIS — Z8042 Family history of malignant neoplasm of prostate: Secondary | ICD-10-CM | POA: Diagnosis not present

## 2022-03-12 DIAGNOSIS — Z79624 Long term (current) use of inhibitors of nucleotide synthesis: Secondary | ICD-10-CM | POA: Diagnosis not present

## 2022-03-12 DIAGNOSIS — I509 Heart failure, unspecified: Secondary | ICD-10-CM | POA: Diagnosis not present

## 2022-03-12 DIAGNOSIS — Z9049 Acquired absence of other specified parts of digestive tract: Secondary | ICD-10-CM | POA: Insufficient documentation

## 2022-03-12 LAB — LACTATE DEHYDROGENASE: LDH: 168 U/L (ref 98–192)

## 2022-03-12 LAB — CBC WITH DIFFERENTIAL (CANCER CENTER ONLY)
Abs Immature Granulocytes: 0.01 10*3/uL (ref 0.00–0.07)
Basophils Absolute: 0 10*3/uL (ref 0.0–0.1)
Basophils Relative: 1 %
Eosinophils Absolute: 0.1 10*3/uL (ref 0.0–0.5)
Eosinophils Relative: 2 %
HCT: 41.3 % (ref 39.0–52.0)
Hemoglobin: 13.5 g/dL (ref 13.0–17.0)
Immature Granulocytes: 0 %
Lymphocytes Relative: 6 %
Lymphs Abs: 0.3 10*3/uL — ABNORMAL LOW (ref 0.7–4.0)
MCH: 35.3 pg — ABNORMAL HIGH (ref 26.0–34.0)
MCHC: 32.7 g/dL (ref 30.0–36.0)
MCV: 108.1 fL — ABNORMAL HIGH (ref 80.0–100.0)
Monocytes Absolute: 0.7 10*3/uL (ref 0.1–1.0)
Monocytes Relative: 13 %
Neutro Abs: 4.2 10*3/uL (ref 1.7–7.7)
Neutrophils Relative %: 78 %
Platelet Count: 156 10*3/uL (ref 150–400)
RBC: 3.82 MIL/uL — ABNORMAL LOW (ref 4.22–5.81)
RDW: 15.7 % — ABNORMAL HIGH (ref 11.5–15.5)
WBC Count: 5.3 10*3/uL (ref 4.0–10.5)
nRBC: 0 % (ref 0.0–0.2)

## 2022-03-12 LAB — CMP (CANCER CENTER ONLY)
ALT: 15 U/L (ref 0–44)
AST: 17 U/L (ref 15–41)
Albumin: 4 g/dL (ref 3.5–5.0)
Alkaline Phosphatase: 53 U/L (ref 38–126)
Anion gap: 9 (ref 5–15)
BUN: 52 mg/dL — ABNORMAL HIGH (ref 8–23)
CO2: 29 mmol/L (ref 22–32)
Calcium: 9 mg/dL (ref 8.9–10.3)
Chloride: 103 mmol/L (ref 98–111)
Creatinine: 3.08 mg/dL (ref 0.61–1.24)
GFR, Estimated: 19 mL/min — ABNORMAL LOW (ref >60)
Glucose, Bld: 75 mg/dL (ref 70–99)
Potassium: 4.5 mmol/L (ref 3.5–5.1)
Sodium: 141 mmol/L (ref 135–145)
Total Bilirubin: 0.6 mg/dL (ref 0.3–1.2)
Total Protein: 7.4 g/dL (ref 6.5–8.1)

## 2022-03-19 ENCOUNTER — Encounter (HOSPITAL_COMMUNITY): Payer: Self-pay | Admitting: *Deleted

## 2022-03-19 ENCOUNTER — Encounter: Payer: Self-pay | Admitting: Hematology

## 2022-03-19 NOTE — Progress Notes (Signed)
HEMATOLOGY/ONCOLOGY CLINIC NOTE  Date of Service: .03/12/2022   Patient Care Team: Donato Heinz, MD as PCP - General (Nephrology) [name], MD as PCP - Cardiology (Cardiology) Brunetta Genera, MD as Consulting Physician (Hematology)  CHIEF COMPLAINTS/PURPOSE OF CONSULTATION:  F/u for Diffuse large B cell lymphoma of the small intestine  HISTORY OF PRESENTING ILLNESS:   Luke Mcbride is a wonderful 86 y.o. male who has been referred to [place] by Dr .Donato Heinz, MD and Mauricio Po MD for evaluation and management of newly diagnosed diffuse large B-cell lymphoma of the small bowel.  Patient has a history of multiple medical comorbidities as noted below including coronary artery disease status post PCI and CABG, chronic kidney disease stage III, Barrett's esophagus, hypertension, dyslipidemia, interstitial lung disease, GERD, vasculitis involving the lungs and kidneys on chronic Imuran therapy.  He was having upper abdominal discomfort for a few months with loss of about 15-20 pounds. He had a CT scan of the abdomen done by Dr. Havery Moros on 06/15/2016 which showed a 3.3 x 3.2 x 2.4 cm indeterminate lesion involving the left upper quadrant small bowel loops. No additional small bowel lesions noted. No ascites. No retroperitoneal mesenteric or pelvic lymphadenopathy noted.  Patient was seen by Dr. Serita Grammes and underwent a diagnostic laparoscopy with small bowel resection on 07/12/2016. Pathology showed that the lesion was consistent with diffuse large B-cell lymphoma 2 cm in length and 4 cm in width centrally ulcerated mass involving 100% of the bowel circumference. The tumor was about 1 cm thick invading through the entire wall and into the underlying mesenteric fat. The tumor also abuts and focally involve the serosa. No extramural satellite lymph nodes noted. Did not seem to involve the peri-intestinal lymph nodes. Tumor was noted to have a germinal center  phenotype.  Patient is healing well from surgery. Continues to be on Imuran 100 mg by mouth daily. Has not reported any other peripheral enlarged lymph nodes. He is starting to eat better.   INTERVAL HISTORY   Luke Mcbride is here for follow-up for his Diffuse large B cell lymphoma of the small intestine. His last clinic visit with [place] was 1 year ago. He notes no new lumps or bumps.  No fevers no chills no night sweats.  No unexpected weight loss. No new abdominal pain or distention. No new shortness of breath or chest pain.[name] Continues to follow with dermatology for recurrent skin cancers. Continues to follow with Dr. Genevie Ann for management of his pulmonary and renal vasculitis.  Continues to be on Imuran. Labs done today were discussed in detail with the patient.[name]  MEDICAL HISTORY:  Past Medical History:  Diagnosis Date   ALLERGIC RHINITIS    Anemia    hx   Barrett's esophagus    BOOP (bronchiolitis obliterans with organizing pneumonia) ([place])    a. s/p R VATS 2008.   CAD (coronary artery disease)    a. 05/2000: NSTEMI/CABG x 6: LIMA->LAD, VG->D1, VG->OM1->2, VG->PDA->RPL;  b. 07/2007 MV: high lat infarct, no ischemia, EF 47%;  c. Cath/PCI: LM nl, LAD20p, 72m D1 nl, D2 60-70ost, D3 nl, LCX 70ost, 101mOM1/OM2 min irregs, RCA 30 diff, PDA 99, LIMA->LAD atretic, VG->D1 100, VG->OM1->2 100, VG->PDA->RPL 90p (4.0x23 Vision BMS);  c. 07/2012 Echo: EF 55%, gr1 DD.   Cataract    CHF (congestive heart failure) (H[facility])    CKD (chronic kidney disease), [place]   a. renal bx 2008: GLN with vasculitis  Diffuse large B cell lymphoma ([place]) 08/07/2016   GERD (gastroesophageal reflux disease)    Glaucoma    a. Cannot see out of L eye.   Hematuria    Microscopic   Hyperglycemia    Patient reported while on prednisone, had to take insulin   Hyperlipemia    Hypertension    Hypothyroidism    ILD (interstitial lung disease) ([place])    Iritis    Local infection of skin and subcutaneous  tissue    Membranoproliferative nephritis    Myocardial infarction ([place]) 2002   Neutropenia, drug-induced (HCC)    Recurrent boils    Residual foreign body in soft tissue    Vasculitis ([place])    a.  pauciimmune vasculitis with renal involvement and hx of transient hemoptysis in the past with associated BOOP, 2008 (renal bx 2008: GLN with vasculitis). b. History of treatment with 2 cycles of Cytoxan and pheresis. H/o hemoptysis and pulm hemorrhage with 2nd cycle of cytoxan.    SURGICAL HISTORY: Past Surgical History:  Procedure Laterality Date   BOWEL RESECTION N/A 07/12/2016   Procedure: SMALL BOWEL RESECTION;  Surgeon: Rolm Bookbinder, MD;  Location: [facility];  Service: General;  Laterality: N/A;   CATARACT EXTRACTION Bilateral    CORONARY ARTERY BYPASS GRAFT  2002   CORONARY STENT INTERVENTION N/A 03/31/2017   Procedure: CORONARY STENT INTERVENTION;  Surgeon: Martinique, Peter M, MD;  Location: [facility];  Service: Cardiovascular;  Laterality: N/A;   IR FLUORO GUIDE PORT INSERTION RIGHT  08/20/2016   IR REMOVAL TUN ACCESS W/ PORT W/O FL MOD SED  01/20/2017   IR US GUIDE VASC ACCESS RIGHT  08/20/2016   LAPAROSCOPY N/A 07/12/2016   Procedure: LAPAROSCOPY DIAGNOSTIC;  Surgeon: Rolm Bookbinder, MD;  Location: [facility];  Service: General;  Laterality: N/A;   LEFT HEART CATHETERIZATION WITH CORONARY/GRAFT ANGIOGRAM N/A 07/26/2012   Procedure: LEFT HEART CATHETERIZATION WITH Beatrix Fetters;  Surgeon: Wellington Hampshire, MD;  Location: [facility];  Service: Cardiovascular;  Laterality: N/A;   LUNG BIOPSY     PERCUTANEOUS CORONARY STENT INTERVENTION (PCI-S)  07/26/2012   Procedure: PERCUTANEOUS CORONARY STENT INTERVENTION (PCI-S);  Surgeon: Wellington Hampshire, MD;  Location: [facility] C[facility];  Service: Cardiovascular;;   RENAL BIOPSY     RIGHT/LEFT HEART CATH AND CORONARY/GRAFT ANGIOGRAPHY N/A 03/23/2017   Procedure: RIGHT/LEFT HEART CATH AND CORONARY/GRAFT ANGIOGRAPHY;  Surgeon: Martinique, Peter M,  MD;  Location: [facility];  Service: Cardiovascular;  Laterality: N/A;   TOE AMPUTATION  2009   hammer toe    SOCIAL HISTORY: Social History   Socioeconomic History   Marital status: Married    Spouse name: Not on file   Number of children: Not on file   Years of education: Not on file   Highest education level: Not on file  Occupational History   Occupation: [employer]  Tobacco Use   Smoking status: Never   Smokeless tobacco: Never  Vaping Use   Vaping Use: Never used  Substance and Sexual Activity   Alcohol use: No   Drug use: No   Sexual activity: Not on file  Other Topics Concern   Not on file  Social History Narrative   Regular exercise - yes      [facility] (07/12/16):   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  no pets currently. No bird or mold exposure.   Social Determinants of Health   Financial Resource Strain: Not on file  Food Insecurity: Not on file  Transportation Needs: Not on file  Physical Activity: Not on file  Stress: Not on file  Social Connections: Not on file  Intimate Partner Violence: Not on file    FAMILY HISTORY: Family History  Problem Relation Age of Onset   Heart disease Mother    Diabetes Mother    Prostate cancer Father    Depression Other    Diabetes Other    Prostate cancer Other    Colon polyps Neg Hx    Colon cancer Neg Hx    Rectal cancer Neg Hx    Stomach cancer Neg Hx    Esophageal cancer Neg Hx     ALLERGIES:  has No Known Allergies.  MEDICATIONS:  Current Outpatient Medications  Medication Sig Dispense Refill   acetaminophen (TYLENOL) 500 MG tablet Take 500 mg by mouth daily as needed for moderate pain or headache.      aspirin EC 81 MG EC tablet Take 1 tablet (81 mg total) by mouth daily. 30 tablet    azaTHIOprine (IMURAN) 50 MG tablet Take 100 mg by mouth daily.     clopidogrel (PLAVIX) 75 MG tablet TAKE 1 TABLET BY MOUTH  DAILY 90 tablet 3   dorzolamide-timolol (COSOPT)  22.3-6.8 MG/ML ophthalmic solution Place 1 drop into both eyes 2 (two) times daily.      empagliflozin (JARDIANCE) 10 MG TABS tablet Take 1 tablet (10 mg total) by mouth daily before breakfast. 30 tablet 11   finasteride (PROSCAR) 5 MG tablet Take 5 mg by mouth at bedtime.      levothyroxine (SYNTHROID, LEVOTHROID) 100 MCG tablet Take 100 mcg by mouth daily before breakfast.     metoprolol succinate (TOPROL-XL) 50 MG 24 hr tablet Take 1 tablet (50 mg total) by mouth daily. 90 tablet 3   Multiple Vitamin (MULTIVITAMIN) tablet Take 1 tablet by mouth daily.     nitroGLYCERIN (NITROSTAT) 0.4 MG SL tablet Place 1 tablet (0.4 mg total) under the tongue every 5 (five) minutes as needed for chest pain. 25 tablet 3   pantoprazole (PROTONIX) 40 MG tablet TAKE 1 TABLET BY MOUTH  DAILY 90 tablet 3   rosuvastatin (CRESTOR) 10 MG tablet Take 10 mg every morning by mouth.     tamsulosin (FLOMAX) 0.4 MG CAPS capsule Take 0.4 mg by mouth at bedtime.     torsemide (DEMADEX) 20 MG tablet Take 2 tablets (40 mg total) by mouth in the morning. 60 tablet 3   No current facility-administered medications for this visit.   Facility-Administered Medications Ordered in Other Visits  Medication Dose Route Frequency Provider Last Rate Last Admin   sodium chloride flush (NS) 0.9 % injection 10 mL  10 mL Intracatheter PRN Brunetta Genera, MD   10 mL at 08/24/16 1653   sodium chloride flush (NS) 0.9 % injection 10 mL  10 mL Intracatheter PRN Brunetta Genera, MD   10 mL at 10/07/16 1555    REVIEW OF SYSTEMS:  10 Point review of Systems was done is negative except as noted above.   PHYSICAL EXAMINATION: ECOG FS:2 - Symptomatic, <50% confined to bed  Vitals:   03/13/21 1216  BP: (!) 142/85  Pulse: 87  Resp: 18  Temp: 97.7 F (36.5 C)  SpO2: 97%   Wt Readings from Last 3 Encounters:  03/13/21 214 lb 6.4 oz (97.3 kg)  02/24/21 217 lb 6.4 oz (98.6 kg)  01/13/21 218 lb 9.6 oz (99.2 kg)   Body mass  index  is 29.08 kg/m.   [name] NAD GENERAL:alert, in no acute distress and comfortable SKIN: no acute rashes, no significant lesions EYES: conjunctiva are pink and non-injected, sclera anicteric OROPHARYNX: MMM, no exudates, no oropharyngeal erythema or ulceration NECK: supple, no JVD LYMPH:  no palpable lymphadenopathy in the cervical, axillary or inguinal regions LUNGS: clear to auscultation b/l with normal respiratory effort HEART: regular rate & rhythm ABDOMEN:  normoactive bowel sounds , non tender, not distended. Extremity: no pedal edema PSYCH: alert & oriented x 3 with fluent speech NEURO: no focal motor/sensory deficits   LABORATORY DATA:  I have reviewed the data as listed .    Latest Ref Rng & Units 03/12/2022   11:31 AM 03/13/2021   11:54 AM 09/30/2020    3:30 PM  CBC  WBC 4.0 - 10.5 K/uL 5.3  5.7  6.4   Hemoglobin 13.0 - 17.0 g/dL 13.5  12.9  13.2   Hematocrit 39.0 - 52.0 % 41.3  39.2  40.2   Platelets 150 - 400 K/uL 156  167  179    .    Latest Ref Rng & Units 03/12/2022   11:31 AM 02/02/2022   11:25 AM 11/24/2021   11:14 AM  CMP  Glucose 70 - 99 mg/dL 75  97  111   BUN 8 - 23 mg/dL 52  48  61   Creatinine 0.61 - 1.24 mg/dL 3.08  3.11  2.98   Sodium 135 - 145 mmol/L 141  138  139   Potassium 3.5 - 5.1 mmol/L 4.5  4.5  3.7   Chloride 98 - 111 mmol/L 103  104  99   CO2 22 - 32 mmol/L '29  21  26   '$ Calcium 8.9 - 10.3 mg/dL 9.0  8.6  8.9   Total Protein 6.5 - 8.1 g/dL 7.4  6.9    Total Bilirubin 0.3 - 1.2 mg/dL 0.6  0.8    Alkaline Phos 38 - 126 U/L 53  46    AST 15 - 41 U/L 17  20    ALT 0 - 44 U/L 15  16      Lab Results  Component Value Date   LDH 168 03/12/2022     RADIOGRAPHIC STUDIES: I have personally reviewed the radiological images as listed and agreed with the findings in the report.  Nm Pet Image Restag (ps) Skull Base To Thigh  Result Date: 10/22/2016 CLINICAL DATA:  Subsequent treatment strategy for diffuse large B-cell non-Hodgkin's lymphoma.  EXAM: NUCLEAR MEDICINE PET SKULL BASE TO THIGH TECHNIQUE: 11.15 mCi F-18 FDG was injected intravenously. Full-ring PET imaging was performed from the skull base to thigh after the radiotracer. CT data was obtained and used for attenuation correction and anatomic localization. FASTING BLOOD GLUCOSE:  Value: 102 mg/dl COMPARISON:  None. FINDINGS: NECK No hypermetabolic lymph nodes in the neck. CHEST The FDG avid right axillary lymph nodes and epicardial lymph nodes have resolved in the interval. The focal hypermetabolic activity in the left ventricular myocardium described on the previous study is unchanged. No new abnormalities in the chest. ABDOMEN/PELVIS Significant improvement in the abdomen. No definitive FDG avid disease remains in the liver or spleen. The adenopathy in the abdomen has resolved. Minimal uptake in the normal appearing left adrenal gland is of no significance. No abnormal uptake seen in the soft tissues of the pelvis. The omental nodularity seen previously has resolved. There is increased attenuation in the fat associated with the anterior ventral hernia with  low level uptake, likely inflammatory. The maximum SUV is measured on image 161 is 3.7. SKELETON There has been significant overall improvement in the bones as well. Uptake in the anterior left acetabulum has significantly diminished with a maximum SUV of 3.4 today versus 29.4 previously. The left iliac uptake seen previously is no longer significantly FDG avid. There is mild focal uptake in the posterior left iliac bone on series 4, image 166 which was not seen previously and demonstrates a maximum SUV of 4.26 today. No other bony abnormalities are identified. IMPRESSION: 1. Significant interval improvement. No FDG avid disease remains in the soft tissues of the neck, chest, abdomen, or pelvis. Very mild uptake remains in the known left anterior acetabular lesion. Mild new uptake in the posterior left iliac bone is nonspecific but could  represent mild involvement. Recommend attention on follow-up. 2. Increased attenuation and mild uptake in the fat of a lower midline ventral hernia is likely inflammatory. Recommend attention on follow-up. 3. No other interval change. Electronically Signed   By: Dorise Bullion III M.D   On: 10/22/2016 15:33   PET/CT 08/06/2016: IMPRESSION: 1. In addition to hypermetabolic nodal involvement of the right axilla, pericardial space, porta hepatis/mesenteric root, and retroperitoneum, there is extensive involvement of the liver as well as peritoneal involvement along the liver surface, splenic surface, and upper omentum. 2. Hypermetabolic bony lesions in the left pelvis compatible with malignant involvement. 3. There is a focus of hypermetabolic activity along the anterolateral wall of the left ventricle. Normally such findings turn out to simply be hypermetabolic myocardium. Strictly speaking given how focal this appears I cannot completely exclude the possibility of tumor involvement within or along the myocardium.     Electronically Signed   By: Van Clines M.D.   On: 08/06/2016 12:48    PET/CT 12/29/2016  IMPRESSION: 1. No evidence of residual or recurrent hypermetabolic lymphoma. 2.  Aortic Atherosclerosis (ICD10-I70.0). 3. Dilatation of the infrarenal aorta and pelvic vasculature, as detailed above.     Electronically Signed   By: Abigail Miyamoto M.D.   On: 12/29/2016 15:17    ASSESSMENT & PLAN:   86 y.o.  male with multiple medical co-morbidities including cardiac co-morbidities, IPF, vascular on chronic immunosupression with    1) Stage IV Diffuse large B-cell lymphoma involving the small intestine (germinal cell phenotype) with complete thickness involvement of bowel and abutting serosa.- currently in remission.  Initial PET/CT scan showed extensive involvement with DLBCL, hypermetabolic nodal involvement of the right axilla, pericardial space, porta  hepatis/mesenteric root, and retroperitoneum, there is extensive involvement of the liver as well as peritoneal involvement along the liver surface, splenic surface, and upper omentum. 2. Hypermetabolic bony lesions in the left pelvis compatible with malignant involvement. 3. There is a focus of hypermetabolic activity along the anterolateral wall of the left ventricle.   s/p 6 cycles of R-CEOP .   PET/CT scan on 10/22/2016 after 3 cycles of chemotherapy shows excellent response to treatment. PET/CT scan on 12/29/2016 after 6 cycles of treatment shows No evidence of residual or recurrent hypermetabolic lymphoma  A)Sh/oneutropenic fever and E.coli/pseudomonal bacteremia/HCAP after the first cycle of treatment.  Significant chemotherapy related thrombocytopenia - now resolved. B) h/o hospitalization for perforation diverticulitis - no issues currently  2) Chronic kidney disease stage III baseline creatinine about 2 to 2.5. - stable today 3) Coronary disease status post PCI and CABG - had a myocardial perfusion imaging scan on 06/30/2016 that showed a moderately decreased ejection fraction of  30-44%- following with cardiology for management. Was having some SOB/DOE and had cardiac cath with PCI to mid LAD on11/15/2018. 4) History of pulmonary and renal vasculitis back on chronic Imuran per Dr Meredeth Ide 5) Neutropenia -resolved  6) Thrombocytopenia due to chemotherapy -resolved  7) Anemia - much improved hgb is up to 13.5  PLAN:  -Discussed patient's labs from today which showed normal CBC CMP with chronic kidney disease creatinine 3.08. LDH within normal limits at 168 Patient has no clinical or lab evidence of recurrent large B-cell lymphoma at this time. No indication for additional treatment of the patient's lymphoma at this time.  We shall see him back in 1 year for continued surveillance. -Continue f/u with Dermatologist for history of recurrent cutaneous skin cancers for continued  monitoring. Follow-up with nephrology for continued evaluation and management of pulmonary and renal vasculitis. -Will see back in 12 months with labs.           FOLLOW UP: RTC with Dr Irene Limbo with labs in 12 months  The total time spent in the appointment was 20 minutes*.  All of the patient's questions were answered with apparent satisfaction. The patient knows to call the clinic with any problems, questions or concerns.   Sullivan Lone MD MS AAHIVMS [facility] Hematology/Oncology Physician [facility]  .*Total Encounter Time as defined by the Centers for Medicare and Medicaid Services includes, in addition to the face-to-face time of a patient visit (documented in the note above) non-face-to-face time: obtaining and reviewing outside history, ordering and reviewing medications, tests or procedures, care coordination (communications with other health care professionals or caregivers) and documentation in the medical record.

## 2022-03-25 DIAGNOSIS — M47816 Spondylosis without myelopathy or radiculopathy, lumbar region: Secondary | ICD-10-CM | POA: Diagnosis not present

## 2022-04-05 ENCOUNTER — Ambulatory Visit (HOSPITAL_COMMUNITY)
Admission: RE | Admit: 2022-04-05 | Discharge: 2022-04-05 | Disposition: A | Discharge status: Home / Self Care | Payer: Medicare Other | Source: Ambulatory Visit | Attending: Family Medicine | Admitting: Family Medicine

## 2022-04-05 ENCOUNTER — Encounter (HOSPITAL_COMMUNITY): Payer: Self-pay

## 2022-04-05 VITALS — BP 112/72 | HR 74 | Wt 216.2 lb

## 2022-04-05 DIAGNOSIS — I252 Old myocardial infarction: Secondary | ICD-10-CM | POA: Insufficient documentation

## 2022-04-05 DIAGNOSIS — I255 Ischemic cardiomyopathy: Secondary | ICD-10-CM | POA: Insufficient documentation

## 2022-04-05 DIAGNOSIS — I25118 Atherosclerotic heart disease of native coronary artery with other forms of angina pectoris: Secondary | ICD-10-CM

## 2022-04-05 DIAGNOSIS — I5022 Chronic systolic (congestive) heart failure: Secondary | ICD-10-CM | POA: Diagnosis not present

## 2022-04-05 DIAGNOSIS — Z8572 Personal history of non-Hodgkin lymphomas: Secondary | ICD-10-CM | POA: Insufficient documentation

## 2022-04-05 DIAGNOSIS — E1122 Type 2 diabetes mellitus with diabetic chronic kidney disease: Secondary | ICD-10-CM | POA: Insufficient documentation

## 2022-04-05 DIAGNOSIS — I493 Ventricular premature depolarization: Secondary | ICD-10-CM | POA: Diagnosis not present

## 2022-04-05 DIAGNOSIS — N1832 Chronic kidney disease, stage 3b: Secondary | ICD-10-CM | POA: Diagnosis not present

## 2022-04-05 DIAGNOSIS — Z7982 Long term (current) use of aspirin: Secondary | ICD-10-CM | POA: Insufficient documentation

## 2022-04-05 DIAGNOSIS — Z951 Presence of aortocoronary bypass graft: Secondary | ICD-10-CM | POA: Insufficient documentation

## 2022-04-05 DIAGNOSIS — Z79899 Other long term (current) drug therapy: Secondary | ICD-10-CM | POA: Diagnosis not present

## 2022-04-05 DIAGNOSIS — I130 Hypertensive heart and chronic kidney disease with heart failure and stage 1 through stage 4 chronic kidney disease, or unspecified chronic kidney disease: Secondary | ICD-10-CM | POA: Insufficient documentation

## 2022-04-05 DIAGNOSIS — I2510 Atherosclerotic heart disease of native coronary artery without angina pectoris: Secondary | ICD-10-CM | POA: Insufficient documentation

## 2022-04-05 DIAGNOSIS — Z955 Presence of coronary angioplasty implant and graft: Secondary | ICD-10-CM | POA: Insufficient documentation

## 2022-04-05 DIAGNOSIS — Z7984 Long term (current) use of oral hypoglycemic drugs: Secondary | ICD-10-CM | POA: Diagnosis not present

## 2022-04-05 LAB — BRAIN NATRIURETIC PEPTIDE: B Natriuretic Peptide: 220.1 pg/mL — ABNORMAL HIGH (ref 0.0–100.0)

## 2022-04-05 LAB — BASIC METABOLIC PANEL
Anion gap: 11 (ref 5–15)
BUN: 48 mg/dL — ABNORMAL HIGH (ref 8–23)
CO2: 23 mmol/L (ref 22–32)
Calcium: 8.3 mg/dL — ABNORMAL LOW (ref 8.9–10.3)
Chloride: 106 mmol/L (ref 98–111)
Creatinine, Ser: 3.04 mg/dL — ABNORMAL HIGH (ref 0.61–1.24)
GFR, Estimated: 20 mL/min — ABNORMAL LOW (ref >60)
Glucose, Bld: 103 mg/dL — ABNORMAL HIGH (ref 70–99)
Potassium: 4.4 mmol/L (ref 3.5–5.1)
Sodium: 140 mmol/L (ref 135–145)

## 2022-04-05 MED ORDER — TORSEMIDE 20 MG PO TABS: 60.0000 mg | ORAL_TABLET | Freq: Every day | ORAL | 3 refills | Status: DC

## 2022-04-05 NOTE — Progress Notes (Signed)
Nephrology: Patient, No Pcp Per  Cardiology: Dr. [name] HF Cardiology: Dr. Aundra Dubin  86 y.o. with history of CAD, ischemic cardiomyopathy, BOOP, pauci-immune vasculitis with CKD stage 3b, and diffuse large B cell lymphoma was referred by Dr. [name] for evaluation of CHF.  Patient had CABG in 2002.  In later years, he had PCI to native LAD and to sequential SVG-PDA/PLV.  LIMA is known to be atretic and SVG-D and sequential SVG-OM1/OM2 known to be occluded.  Most recent echo in 10/21 showed EF 35-40%.  Patient was diagnosed with pauci-immune vasculitis with glomerulonephritis by biopsy in 2008.  He is followed by Dr. Marval Regal and currently is on Imuran.  CKD stage 3b currently. He has been diagnosed by BOOP and used to be followed by pulmonary (not seen recently).  He was diagnosed with diffuse large B cell lymphoma in 3/18 and had 6 inches of his colon resected.  He was treated with chemotherapy.    Medical management of his cardiomyopathy has been limited by CKD stage 3b.    Echo 3/23 EF 35%, global hypokinesis, mildly decreased RV systolic function. Zio monitor in 7/23 showed 11.3% PVCs and 6 runs NSVT, amiodarone was started.    Follow up 9/23, NYHA III and mild volume overload. Torsemide increased to 40 daily and Verquvo 2.5 mg added.  Today he returns for HF follow up with his daughter. Overall feeling "about the same". He is short of breath after walking short distances on flat ground. He gets around his house OK if he takes his time. Generally remains fatigued. He did not feel well after taking Verquvo and stopped. Denies palpitations, CP, dizziness, edema, or PND/Orthopnea. Appetite ok. No fever or chills. Weight at home 212-215 pounds. Follows with Ortho for chronic lower back pain.  ReDs: 37%  ECG (personally reviewed): none ordered today.  Labs (12/21): K 4.5, creatinine 2.35, pro-BNP 1426 Labs (5/22): K 4.3, creatinine 2.8 with GFR 22 Labs (6/22): K 4.3, creatinine 2.65 with GFR  23 Labs (8/22): LDL 78 Labs (12/22): K 4.3, creatinine 2.51 Labs (3/23): K 4.1, creatinine 2.57 Labs (7/23): K 4.6, creatinine 2.88 => 2.98, LDL 66, TGs 250 Labs (9/23): normal LFTs, normal TSH Labs (10/23): K 4.5, creatinine 3.08  PMH: 1. Type 2 diabetes 2. HTN 3. Diffuse large B cell lymphoma: Diagnosed 3/18, 6 inches of colon resected and treated with chemotherapy.  4. BOOP: Followed by pulmonary  5. GERD with Barrett's esophagus.  6. Glomerulonephritis with vasculitis: Pauci-immune vasculitis. Has had associated pulmonary hemorrhage.  - Prior treatment with Cytoxan and plasmapheresis, currently on Imuran.  - CKD stage 3b.  7. Chronic systolic CHF: Ischemic cardiomyopathy.   - Echo (9/18): EF 35-40%.  - Echo (10/21) with EF 35-40%, mild LVH, severe LAE, normal RV - Echo (3/23): EF 35%, global hypokinesis, mildly decreased RV systolic function.  8. CAD: CABG x 6 in 1/02 with LIMA-LAD, SVG-D1, seq SVG-OM1/2, seq SVG-PDA/PLV.  - NSTEMI 3/14: BMS to seq SVG-PDA/PLV, LIMA atretic but good flow down LAD, SVG-OM1/2 and SVG-D occluded.  - Angina 11/18: PCI to 80% mid LAD stenosis, seq SVG-PDA/PLV remained patent, other grafts occluded (LIMA atretic).  9. PVCs: Zio in 7/23 showed 11.3% PVCs and 6 runs NSVT.   Social History   Socioeconomic History   Marital status: Married    Spouse name: Not on file   Number of children: Not on file   Years of education: Not on file   Highest education level: Not on file  Occupational History  Occupation: [employer]  Tobacco Use   Smoking status: Never   Smokeless tobacco: Never  Vaping Use   Vaping Use: Never used  Substance and Sexual Activity   Alcohol use: No   Drug use: No   Sexual activity: Not on file  Other Topics Concern   Not on file  Social History Narrative   Regular exercise - yes      [facility] (07/12/16):   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  no pets currently. No bird or mold exposure.    Social Determinants of Health   Financial Resource Strain: Low Risk  (02/04/2022)   Overall Financial Resource Strain (CARDIA)    Difficulty of Paying Living Expenses: Not very hard  Food Insecurity: No Food Insecurity (02/04/2022)   Hunger Vital Sign    Worried About Running Out of Food in the Last Year: Never true    Ran Out of Food in the Last Year: Never true  Transportation Needs: No Transportation Needs (02/04/2022)   PRAPARE - Hydrologist (Medical): No    Lack of Transportation (Non-Medical): No  Physical Activity: Not on file  Stress: Not on file  Social Connections: Not on file  Intimate Partner Violence: Not on file   Family History  Problem Relation Age of Onset   Heart disease Mother    Diabetes Mother    Prostate cancer Father    Depression Other    Diabetes Other    Prostate cancer Other    Colon polyps Neg Hx    Colon cancer Neg Hx    Rectal cancer Neg Hx    Stomach cancer Neg Hx    Esophageal cancer Neg Hx    ROS: All systems reviewed and negative except as per HPI.   Current Outpatient Medications  Medication Sig Dispense Refill   acetaminophen (TYLENOL) 500 MG tablet Take 500 mg by mouth daily as needed for moderate pain or headache.      amiodarone (PACERONE) 200 MG tablet Take 1 tablet (200 mg total) by mouth daily. 90 tablet 3   aspirin EC 81 MG EC tablet Take 1 tablet (81 mg total) by mouth daily. 30 tablet    azaTHIOprine (IMURAN) 50 MG tablet Take 100 mg by mouth daily.     clopidogrel (PLAVIX) 75 MG tablet TAKE 1 TABLET BY MOUTH  DAILY 90 tablet 3   dorzolamide-timolol (COSOPT) 22.3-6.8 MG/ML ophthalmic solution Place 1 drop into both eyes 2 (two) times daily.      empagliflozin (JARDIANCE) 10 MG TABS tablet Take 1 tablet (10 mg total) by mouth daily before breakfast. 90 tablet 3   finasteride (PROSCAR) 5 MG tablet Take 5 mg by mouth at bedtime.      levothyroxine (SYNTHROID, LEVOTHROID) 100 MCG tablet Take 100 mcg by  mouth daily before breakfast.     metoprolol succinate (TOPROL-XL) 50 MG 24 hr tablet Take 1.5 tablets (75 mg total) by mouth daily. 135 tablet 3   Multiple Vitamin (MULTIVITAMIN) tablet Take 1 tablet by mouth daily.     nitroGLYCERIN (NITROSTAT) 0.4 MG SL tablet Place 1 tablet (0.4 mg total) under the tongue every 5 (five) minutes as needed for chest pain. 25 tablet 3   pantoprazole (PROTONIX) 40 MG tablet TAKE 1 TABLET BY MOUTH  DAILY 90 tablet 3   rosuvastatin (CRESTOR) 10 MG tablet Take 10 mg every morning by mouth.     tamsulosin (FLOMAX) 0.4 MG CAPS capsule  Take 0.4 mg by mouth at bedtime.     torsemide (DEMADEX) 20 MG tablet Take 2 tablets (40 mg total) by mouth daily. 180 tablet 3   Vericiguat (VERQUVO) 2.5 MG TABS Take 2.5 mg by mouth daily. (Patient not taking: Reported on 04/05/2022) 90 tablet 3   No current facility-administered medications for this encounter.   Facility-Administered Medications Ordered in Other Encounters  Medication Dose Route Frequency Provider Last Rate Last Admin   sodium chloride flush (NS) 0.9 % injection 10 mL  10 mL Intracatheter PRN Brunetta Genera, MD   10 mL at 08/24/16 1653   sodium chloride flush (NS) 0.9 % injection 10 mL  10 mL Intracatheter PRN Brunetta Genera, MD   10 mL at 10/07/16 1555   Wt Readings from Last 3 Encounters:  04/05/22 98.1 kg (216 lb 3.2 oz)  03/12/22 95.8 kg (211 lb 3.2 oz)  02/02/22 96.8 kg (213 lb 6.4 oz)   BP 112/72   Pulse 74   Wt 98.1 kg (216 lb 3.2 oz)   SpO2 99%   BMI 29.32 kg/m  Physical Exam General:  NAD. No resp difficulty, walked into clinic, chronically-ill appearing HEENT: Normal Neck: Supple. JVP 8-9. Carotids 2+ bilat; no bruits. No lymphadenopathy or thryomegaly appreciated. Cor: PMI nondisplaced. Regular rate & rhythm. No rubs, gallops or murmurs. Lungs: Diminished in bases. Abdomen: Soft, nontender, nondistended. No hepatosplenomegaly. No bruits or masses. Good bowel sounds. Extremities:  No cyanosis, clubbing, rash, 1+ RLE edema Neuro: Alert & oriented x 3, cranial nerves grossly intact. Moves all 4 extremities w/o difficulty. Affect pleasant.  Assessment/Plan: 1. Chronic systolic CHF: Ischemic cardiomyopathy. Echo in 10/21 with EF 35-40%, comparable to prior echo in 9/18.  Echo 3/23 with EF 35%, mildly decreased RV systolic function.  CHF is complicated by CKD stage 3b which worsens fluid retention and also limits GDMT. NYHA III symptoms with mild volume overload on exam and by REDS clip 37%.  - Increase torsemide to 80 mg daily x 3 days, then switch to 60 mg daily thereafter. BMET/BNP today, repeat BMET in 10 days. - Continue Jardiance 10 mg daily. - Continue Toprol XL 75 mg daily.  - He did not tolerate Verquvo (no specific complaint, "did not feel well on it.") - No ARB/ARNI with CKD stage 3b.   - Creatinine has been too high for spironolactone.  - Not ICD candidate with age, narrow QRS so would not benefit from CRT.  2. CAD: S/p CABG 1/02 with subsequent PCI to seq SVG-PDA/PLV and later to native LAD.  He denies chest pain.   - Continue ASA 81 daily.  - Continue Crestor 10 mg daily. Good lipids in 7/23.  3. CKD stage 3b: From pauci-immune vasculitis with glomerulonephritis. BMET today. 4. PVCs: Zio in 7/23 with 11% PVCs and 6 NSVT runs.  - Continue amiodarone 200 mg daily.  LFTs and TSH ok 9/23, he will need regular eye exam. 5. Left lower leg asymmetric edema: lower extremity venous doppler 9/23 negative for DVT.  Follow up in 4-6 weeks with APP to recheck fluid, and 3 months with Dr. Aundra Dubin.  Maricela Bo [facility] FNP-BC 04/05/2022

## 2022-04-05 NOTE — Patient Instructions (Signed)
Thank you for coming in today  Labs were done today, if any labs are abnormal the clinic will call you No news is good news  STOP Verquvo  INCREASE Torsemide 80 mg daily 4 tablets for 3 days only, then 60 mg 3 tablets daily   Your physician recommends that you return for lab work in: 1 week for BMET  Your physician recommends that you schedule a follow-up appointment in:  4-6 weeks in clinic 3 months with Dr. Aundra Dubin    Do the following things EVERYDAY: Weigh yourself in the morning before breakfast. Write it down and keep it in a log. Take your medicines as prescribed Eat low salt foods--Limit salt (sodium) to 2000 mg per day.  Stay as active as you can everyday Limit all fluids for the day to less than 2 liters  At the [facility], you and your health needs are our priority. As part of our continuing mission to provide you with exceptional heart care, we have created designated Provider Care Teams. These Care Teams include your primary Cardiologist (physician) and Advanced Practice Providers (APPs- Physician Assistants and Nurse Practitioners) who all work together to provide you with the care you need, when you need it.   You may see any of the following providers on your designated Care Team at your next follow up: Dr Glori Bickers Dr Loralie Champagne Dr. Roxana Hires, NP Lyda Jester, [place] [facility] [place], [place] Forestine Na, NP Audry Riles, PharmD   Please be sure to bring in all your medications bottles to every appointment.   If you have any questions or concerns before your next appointment please send [place] a message through [place] or call our office at [phone].    TO LEAVE A MESSAGE FOR THE NURSE SELECT OPTION 2, PLEASE LEAVE A MESSAGE INCLUDING: YOUR NAME DATE OF BIRTH CALL BACK NUMBER REASON FOR CALL**this is important as we prioritize the call backs  YOU WILL RECEIVE A CALL BACK THE SAME DAY AS LONG AS YOU  CALL BEFORE 4:00 PM

## 2022-04-05 NOTE — Progress Notes (Signed)
ReDS Vest / Clip - 04/05/22 1100       ReDS Vest / Clip   Station Marker C    Ruler Value 25    ReDS Value Range Moderate volume overload    ReDS Actual Value 37    Anatomical Comments sitting

## 2022-04-13 DIAGNOSIS — H44512 Absolute glaucoma, left eye: Secondary | ICD-10-CM | POA: Diagnosis not present

## 2022-04-13 DIAGNOSIS — Z9841 Cataract extraction status, right eye: Secondary | ICD-10-CM | POA: Diagnosis not present

## 2022-04-13 DIAGNOSIS — H5211 Myopia, right eye: Secondary | ICD-10-CM | POA: Diagnosis not present

## 2022-04-13 DIAGNOSIS — Z9842 Cataract extraction status, left eye: Secondary | ICD-10-CM | POA: Diagnosis not present

## 2022-04-13 DIAGNOSIS — H401211 Low-tension glaucoma, right eye, mild stage: Secondary | ICD-10-CM | POA: Diagnosis not present

## 2022-04-15 ENCOUNTER — Ambulatory Visit (HOSPITAL_COMMUNITY)
Admission: RE | Admit: 2022-04-15 | Discharge: 2022-04-15 | Disposition: A | Discharge status: Home / Self Care | Payer: Medicare Other | Source: Ambulatory Visit | Attending: Cardiology | Admitting: Cardiology

## 2022-04-15 DIAGNOSIS — Z01812 Encounter for preprocedural laboratory examination: Secondary | ICD-10-CM | POA: Insufficient documentation

## 2022-04-15 DIAGNOSIS — I5022 Chronic systolic (congestive) heart failure: Secondary | ICD-10-CM | POA: Diagnosis not present

## 2022-04-15 LAB — BASIC METABOLIC PANEL
Anion gap: 10 (ref 5–15)
BUN: 50 mg/dL — ABNORMAL HIGH (ref 8–23)
CO2: 26 mmol/L (ref 22–32)
Calcium: 8.7 mg/dL — ABNORMAL LOW (ref 8.9–10.3)
Chloride: 105 mmol/L (ref 98–111)
Creatinine, Ser: 3.47 mg/dL — ABNORMAL HIGH (ref 0.61–1.24)
GFR, Estimated: 17 mL/min — ABNORMAL LOW (ref >60)
Glucose, Bld: 91 mg/dL (ref 70–99)
Potassium: 4.7 mmol/L (ref 3.5–5.1)
Sodium: 141 mmol/L (ref 135–145)

## 2022-04-16 DIAGNOSIS — M47816 Spondylosis without myelopathy or radiculopathy, lumbar region: Secondary | ICD-10-CM | POA: Diagnosis not present

## 2022-04-16 DIAGNOSIS — M47896 Other spondylosis, lumbar region: Secondary | ICD-10-CM | POA: Diagnosis not present

## 2022-04-20 ENCOUNTER — Telehealth (HOSPITAL_COMMUNITY): Payer: Self-pay | Admitting: *Deleted

## 2022-04-20 DIAGNOSIS — I5022 Chronic systolic (congestive) heart failure: Secondary | ICD-10-CM

## 2022-04-20 NOTE — Telephone Encounter (Signed)
Called patient per Luke Katz, NP with lab results and following instructions: 1. Kidney function declining.  2. Hold torsemide x 1 day, then resume at 40 mg daily alternating with 60 mg every other day.  3. Repeat labs on Monday, 05/03/22 at 11:00. Order placed.   Pt verbalized understanding of same.

## 2022-04-29 DIAGNOSIS — N179 Acute kidney failure, unspecified: Secondary | ICD-10-CM | POA: Diagnosis not present

## 2022-04-29 DIAGNOSIS — E7800 Pure hypercholesterolemia, unspecified: Secondary | ICD-10-CM | POA: Diagnosis not present

## 2022-04-29 DIAGNOSIS — R809 Proteinuria, unspecified: Secondary | ICD-10-CM | POA: Diagnosis not present

## 2022-04-29 DIAGNOSIS — N400 Enlarged prostate without lower urinary tract symptoms: Secondary | ICD-10-CM | POA: Diagnosis not present

## 2022-04-29 DIAGNOSIS — I2510 Atherosclerotic heart disease of native coronary artery without angina pectoris: Secondary | ICD-10-CM | POA: Diagnosis not present

## 2022-04-29 DIAGNOSIS — I129 Hypertensive chronic kidney disease with stage 1 through stage 4 chronic kidney disease, or unspecified chronic kidney disease: Secondary | ICD-10-CM | POA: Diagnosis not present

## 2022-04-29 DIAGNOSIS — N058 Unspecified nephritic syndrome with other morphologic changes: Secondary | ICD-10-CM | POA: Diagnosis not present

## 2022-04-29 DIAGNOSIS — N184 Chronic kidney disease, stage 4 (severe): Secondary | ICD-10-CM | POA: Diagnosis not present

## 2022-04-29 DIAGNOSIS — E1129 Type 2 diabetes mellitus with other diabetic kidney complication: Secondary | ICD-10-CM | POA: Diagnosis not present

## 2022-04-29 DIAGNOSIS — N057 Unspecified nephritic syndrome with diffuse crescentic glomerulonephritis: Secondary | ICD-10-CM | POA: Diagnosis not present

## 2022-04-29 DIAGNOSIS — I5042 Chronic combined systolic (congestive) and diastolic (congestive) heart failure: Secondary | ICD-10-CM | POA: Diagnosis not present

## 2022-04-29 DIAGNOSIS — J4481 Bronchiolitis obliterans and bronchiolitis obliterans syndrome: Secondary | ICD-10-CM | POA: Diagnosis not present

## 2022-04-30 DIAGNOSIS — L570 Actinic keratosis: Secondary | ICD-10-CM | POA: Diagnosis not present

## 2022-04-30 DIAGNOSIS — Z08 Encounter for follow-up examination after completed treatment for malignant neoplasm: Secondary | ICD-10-CM | POA: Diagnosis not present

## 2022-04-30 DIAGNOSIS — C44319 Basal cell carcinoma of skin of other parts of face: Secondary | ICD-10-CM | POA: Diagnosis not present

## 2022-04-30 DIAGNOSIS — I872 Venous insufficiency (chronic) (peripheral): Secondary | ICD-10-CM | POA: Diagnosis not present

## 2022-04-30 DIAGNOSIS — X32XXXD Exposure to sunlight, subsequent encounter: Secondary | ICD-10-CM | POA: Diagnosis not present

## 2022-04-30 DIAGNOSIS — L820 Inflamed seborrheic keratosis: Secondary | ICD-10-CM | POA: Diagnosis not present

## 2022-04-30 DIAGNOSIS — Z85828 Personal history of other malignant neoplasm of skin: Secondary | ICD-10-CM | POA: Diagnosis not present

## 2022-05-03 ENCOUNTER — Other Ambulatory Visit (HOSPITAL_COMMUNITY): Payer: Medicare Other

## 2022-05-05 ENCOUNTER — Other Ambulatory Visit (HOSPITAL_COMMUNITY): Payer: Self-pay

## 2022-05-05 NOTE — Progress Notes (Signed)
Nephrology: Patient, No Pcp Per  Cardiology: Dr. [name] HF Cardiology: Dr. Aundra Dubin  86 y.o. with history of CAD, ischemic cardiomyopathy, BOOP, pauci-immune vasculitis with CKD stage 3b, and diffuse large B cell lymphoma was referred by Dr. [name] for evaluation of CHF.  Patient had CABG in 2002.  In later years, he had PCI to native LAD and to sequential SVG-PDA/PLV.  LIMA is known to be atretic and SVG-D and sequential SVG-OM1/OM2 known to be occluded.  Most recent echo in 10/21 showed EF 35-40%.  Patient was diagnosed with pauci-immune vasculitis with glomerulonephritis by biopsy in 2008.  He is followed by Dr. Marval Regal and currently is on Imuran.  CKD stage 3b currently. He has been diagnosed by BOOP and used to be followed by pulmonary (not seen recently).  He was diagnosed with diffuse large B cell lymphoma in 3/18 and had 6 inches of his colon resected.  He was treated with chemotherapy.    Medical management of his cardiomyopathy has been limited by CKD stage 3b.    Echo 3/23 EF 35%, global hypokinesis, mildly decreased RV systolic function. Zio monitor in 7/23 showed 11.3% PVCs and 6 runs NSVT, amiodarone was started.    Follow up 9/23, NYHA III and mild volume overload. Torsemide increased to 40 daily and Verquvo 2.5 mg added.  Today he returns for HF follow up with his daughter. Overall feeling fair. He tripped and fell 2 weeks ago, did not hit his head.  He remains short of breath walking short distances on flat ground. Denies palpitations, abnormal bleeding, CP, dizziness, edema, or PND/Orthopnea. Appetite ok. No fever or chills. Weight at home 212-215 pounds. Taking all medications. Saw his nephrologist 2 weeks ago, repeat labs showed SCr improved to 2.98 and K 4.6  on torsemide 40 mg daily.   ReDs: 37%-->29% today.  ECG (personally reviewed): none ordered today.  Labs (12/21): K 4.5, creatinine 2.35, pro-BNP 1426 Labs (5/22): K 4.3, creatinine 2.8 with GFR 22 Labs (6/22): K 4.3,  creatinine 2.65 with GFR 23 Labs (8/22): LDL 78 Labs (12/22): K 4.3, creatinine 2.51 Labs (3/23): K 4.1, creatinine 2.57 Labs (7/23): K 4.6, creatinine 2.88 => 2.98, LDL 66, TGs 250 Labs (9/23): normal LFTs, normal TSH Labs (10/23): K 4.5, creatinine 3.08 Labs (11/23): K 4.7, creatinine 3.47 Labs (12/23): K 4.6, creatinine 2.98  PMH: 1. Type 2 diabetes 2. HTN 3. Diffuse large B cell lymphoma: Diagnosed 3/18, 6 inches of colon resected and treated with chemotherapy.  4. BOOP: Followed by pulmonary  5. GERD with Barrett's esophagus.  6. Glomerulonephritis with vasculitis: Pauci-immune vasculitis. Has had associated pulmonary hemorrhage.  - Prior treatment with Cytoxan and plasmapheresis, currently on Imuran.  - CKD stage 3b.  7. Chronic systolic CHF: Ischemic cardiomyopathy.   - Echo (9/18): EF 35-40%.  - Echo (10/21) with EF 35-40%, mild LVH, severe LAE, normal RV - Echo (3/23): EF 35%, global hypokinesis, mildly decreased RV systolic function.  8. CAD: CABG x 6 in 1/02 with LIMA-LAD, SVG-D1, seq SVG-OM1/2, seq SVG-PDA/PLV.  - NSTEMI 3/14: BMS to seq SVG-PDA/PLV, LIMA atretic but good flow down LAD, SVG-OM1/2 and SVG-D occluded.  - Angina 11/18: PCI to 80% mid LAD stenosis, seq SVG-PDA/PLV remained patent, other grafts occluded (LIMA atretic).  9. PVCs: Zio in 7/23 showed 11.3% PVCs and 6 runs NSVT.   Social History   Socioeconomic History   Marital status: Married    Spouse name: Not on file   Number of children: Not on file  Years of education: Not on file   Highest education level: Not on file  Occupational History   Occupation: [employer]  Tobacco Use   Smoking status: Never   Smokeless tobacco: Never  Vaping Use   Vaping Use: Never used  Substance and Sexual Activity   Alcohol use: No   Drug use: No   Sexual activity: Not on file  Other Topics Concern   Not on file  Social History Narrative   Regular exercise - yes      [facility] (07/12/16):   Lives  with his wife. Currently works  Air traffic controller.  no pets currently. No bird or mold exposure.   Social Determinants of Health   Financial Resource Strain: Low Risk  (02/04/2022)   Overall Financial Resource Strain (CARDIA)    Difficulty of Paying Living Expenses: Not very hard  Food Insecurity: No Food Insecurity (02/04/2022)   Hunger Vital Sign    Worried About Running Out of Food in the Last Year: Never true    Ran Out of Food in the Last Year: Never true  Transportation Needs: No Transportation Needs (02/04/2022)   PRAPARE - Hydrologist (Medical): No    Lack of Transportation (Non-Medical): No  Physical Activity: Not on file  Stress: Not on file  Social Connections: Not on file  Intimate Partner Violence: Not on file   Family History  Problem Relation Age of Onset   Heart disease Mother    Diabetes Mother    Prostate cancer Father    Depression Other    Diabetes Other    Prostate cancer Other    Colon polyps Neg Hx    Colon cancer Neg Hx    Rectal cancer Neg Hx    Stomach cancer Neg Hx    Esophageal cancer Neg Hx    ROS: All systems reviewed and negative except as per HPI.   Current Outpatient Medications  Medication Sig Dispense Refill   acetaminophen (TYLENOL) 500 MG tablet Take 500 mg by mouth daily as needed for moderate pain or headache.      amiodarone (PACERONE) 200 MG tablet Take 1 tablet (200 mg total) by mouth daily. 90 tablet 3   aspirin EC 81 MG EC tablet Take 1 tablet (81 mg total) by mouth daily. 30 tablet    azaTHIOprine (IMURAN) 50 MG tablet Take 100 mg by mouth daily.     clopidogrel (PLAVIX) 75 MG tablet TAKE 1 TABLET BY MOUTH  DAILY 90 tablet 3   dorzolamide-timolol (COSOPT) 22.3-6.8 MG/ML ophthalmic solution Place 1 drop into both eyes 2 (two) times daily.      empagliflozin (JARDIANCE) 10 MG TABS tablet Take 1 tablet (10 mg total) by mouth daily before breakfast. 90 tablet 3   finasteride (PROSCAR) 5 MG tablet Take  5 mg by mouth at bedtime.      levothyroxine (SYNTHROID, LEVOTHROID) 100 MCG tablet Take 100 mcg by mouth daily before breakfast.     metoprolol succinate (TOPROL-XL) 50 MG 24 hr tablet Take 1.5 tablets (75 mg total) by mouth daily. 135 tablet 3   Multiple Vitamin (MULTIVITAMIN) tablet Take 1 tablet by mouth daily.     pantoprazole (PROTONIX) 40 MG tablet TAKE 1 TABLET BY MOUTH  DAILY 90 tablet 3   rosuvastatin (CRESTOR) 10 MG tablet Take 10 mg every morning by mouth.     tamsulosin (FLOMAX) 0.4 MG CAPS capsule Take 0.4 mg by mouth at bedtime.  torsemide (DEMADEX) 20 MG tablet Take 40 mg by mouth daily.     nitroGLYCERIN (NITROSTAT) 0.4 MG SL tablet Place 1 tablet (0.4 mg total) under the tongue every 5 (five) minutes as needed for chest pain. (Patient not taking: Reported on 05/18/2022) 25 tablet 3   Vericiguat (VERQUVO) 2.5 MG TABS Take 2.5 mg by mouth daily. (Patient not taking: Reported on 04/05/2022) 90 tablet 3   No current facility-administered medications for this encounter.   Facility-Administered Medications Ordered in Other Encounters  Medication Dose Route Frequency Provider Last Rate Last Admin   sodium chloride flush (NS) 0.9 % injection 10 mL  10 mL Intracatheter PRN Brunetta Genera, MD   10 mL at 08/24/16 1653   sodium chloride flush (NS) 0.9 % injection 10 mL  10 mL Intracatheter PRN Brunetta Genera, MD   10 mL at 10/07/16 1555   Wt Readings from Last 3 Encounters:  05/18/22 98 kg (216 lb)  04/05/22 98.1 kg (216 lb 3.2 oz)  03/12/22 95.8 kg (211 lb 3.2 oz)   BP 128/80   Pulse 62   Wt 98 kg (216 lb)   SpO2 97%   BMI 29.29 kg/m   Physical Exam General:  NAD. No resp difficulty, chronically-ill appearing, walked into clinic with cane. HEENT: Normal Neck: Supple. No JVD. Carotids 2+ bilat; no bruits. No lymphadenopathy or thryomegaly appreciated. Cor: PMI nondisplaced. Regular rate & rhythm. No rubs, gallops or murmurs. Lungs: Clear Abdomen: Soft,  nontender, nondistended. No hepatosplenomegaly. No bruits or masses. Good bowel sounds. Extremities: No cyanosis, clubbing, rash, edema Neuro: Alert & oriented x 3, cranial nerves grossly intact. Moves all 4 extremities w/o difficulty. Affect pleasant.  Assessment/Plan: 1. Chronic systolic CHF: Ischemic cardiomyopathy. Echo in 10/21 with EF 35-40%, comparable to prior echo in 9/18.  Echo 3/23 with EF 35%, mildly decreased RV systolic function.  CHF is complicated by CKD stage 3b which worsens fluid retention and also limits GDMT. NYHA III symptoms, he is not volume overloaded on exam, ReDs 29%.   - Continue torsemide 40 mg daily. Recent labs at Nephrology visit reviewed and OK. - Continue Jardiance 10 mg daily. - Continue Toprol XL 75 mg daily.  - He did not tolerate Verquvo (no specific complaint, "did not feel well on it.") - No ARB/ARNI with CKD stage 3b.   - Creatinine has been too high for spironolactone.  - Not ICD candidate with age, narrow QRS so would not benefit from CRT.  2. CAD: S/p CABG 1/02 with subsequent PCI to seq SVG-PDA/PLV and later to native LAD.  He denies chest pain.   - Continue ASA 81 daily.  - Continue Crestor 10 mg daily. Good lipids in 7/23.  3. CKD stage 3b: From pauci-immune vasculitis with glomerulonephritis.  - He is followed by [facility], Dr. Arty Baumgartner 4. PVCs: Zio in 7/23 with 11% PVCs and 6 NSVT runs.  - Continue amiodarone 200 mg daily.  LFTs and TSH ok 9/23, he will need regular eye exam. 5. Left lower leg asymmetric edema: lower extremity venous doppler 9/23 negative for DVT.  Follow up in 2 months with Dr. Aundra Dubin.  Maricela Bo [facility] FNP-BC 05/18/2022

## 2022-05-13 ENCOUNTER — Other Ambulatory Visit (HOSPITAL_COMMUNITY): Payer: Self-pay

## 2022-05-18 ENCOUNTER — Ambulatory Visit (HOSPITAL_COMMUNITY)
Admission: RE | Admit: 2022-05-18 | Discharge: 2022-05-18 | Disposition: A | Discharge status: Home / Self Care | Payer: Medicare Other | Source: Ambulatory Visit | Attending: Family Medicine | Admitting: Family Medicine

## 2022-05-18 ENCOUNTER — Encounter (HOSPITAL_COMMUNITY): Payer: Self-pay

## 2022-05-18 VITALS — BP 128/80 | HR 62 | Wt 216.0 lb

## 2022-05-18 DIAGNOSIS — Z7984 Long term (current) use of oral hypoglycemic drugs: Secondary | ICD-10-CM | POA: Diagnosis not present

## 2022-05-18 DIAGNOSIS — I493 Ventricular premature depolarization: Secondary | ICD-10-CM | POA: Insufficient documentation

## 2022-05-18 DIAGNOSIS — R609 Edema, unspecified: Secondary | ICD-10-CM | POA: Diagnosis not present

## 2022-05-18 DIAGNOSIS — Z79899 Other long term (current) drug therapy: Secondary | ICD-10-CM | POA: Diagnosis not present

## 2022-05-18 DIAGNOSIS — I2510 Atherosclerotic heart disease of native coronary artery without angina pectoris: Secondary | ICD-10-CM | POA: Diagnosis not present

## 2022-05-18 DIAGNOSIS — R0602 Shortness of breath: Secondary | ICD-10-CM | POA: Insufficient documentation

## 2022-05-18 DIAGNOSIS — E1122 Type 2 diabetes mellitus with diabetic chronic kidney disease: Secondary | ICD-10-CM | POA: Insufficient documentation

## 2022-05-18 DIAGNOSIS — Z951 Presence of aortocoronary bypass graft: Secondary | ICD-10-CM | POA: Diagnosis not present

## 2022-05-18 DIAGNOSIS — I5022 Chronic systolic (congestive) heart failure: Secondary | ICD-10-CM | POA: Diagnosis not present

## 2022-05-18 DIAGNOSIS — I130 Hypertensive heart and chronic kidney disease with heart failure and stage 1 through stage 4 chronic kidney disease, or unspecified chronic kidney disease: Secondary | ICD-10-CM | POA: Diagnosis not present

## 2022-05-18 DIAGNOSIS — I255 Ischemic cardiomyopathy: Secondary | ICD-10-CM | POA: Insufficient documentation

## 2022-05-18 DIAGNOSIS — Z955 Presence of coronary angioplasty implant and graft: Secondary | ICD-10-CM | POA: Diagnosis not present

## 2022-05-18 DIAGNOSIS — N1832 Chronic kidney disease, stage 3b: Secondary | ICD-10-CM | POA: Insufficient documentation

## 2022-05-18 DIAGNOSIS — Z7982 Long term (current) use of aspirin: Secondary | ICD-10-CM | POA: Diagnosis not present

## 2022-05-18 DIAGNOSIS — I25118 Atherosclerotic heart disease of native coronary artery with other forms of angina pectoris: Secondary | ICD-10-CM

## 2022-05-18 NOTE — Progress Notes (Signed)
ReDS Vest / Clip - 05/18/22 1150       ReDS Vest / Clip   Station Marker C    Ruler Value 31    ReDS Value Range Low volume    ReDS Actual Value 29    Anatomical Comments sitting

## 2022-05-18 NOTE — Patient Instructions (Addendum)
Thank you for coming in today   No Labs today   Your physician recommends that you schedule a follow-up appointment in:  2 months with Dr. Aundra Dubin    Do the following things EVERYDAY: Weigh yourself in the morning before breakfast. Write it down and keep it in a log. Take your medicines as prescribed Eat low salt foods--Limit salt (sodium) to 2000 mg per day.  Stay as active as you can everyday Limit all fluids for the day to less than 2 liters  At the [facility], you and your health needs are our priority. As part of our continuing mission to provide you with exceptional heart care, we have created designated Provider Care Teams. These Care Teams include your primary Cardiologist (physician) and Advanced Practice Providers (APPs- Physician Assistants and Nurse Practitioners) who all work together to provide you with the care you need, when you need it.   You may see any of the following providers on your designated Care Team at your next follow up: Dr Glori Bickers Dr Loralie Champagne Dr. Roxana Hires, NP Lyda Jester, [place] [facility] [place], [place] Forestine Na, NP Audry Riles, PharmD   Please be sure to bring in all your medications bottles to every appointment.   If you have any questions or concerns before your next appointment please send [place] a message through [place] or call our office at [phone].    TO LEAVE A MESSAGE FOR THE NURSE SELECT OPTION 2, PLEASE LEAVE A MESSAGE INCLUDING: YOUR NAME DATE OF BIRTH CALL BACK NUMBER REASON FOR CALL**this is important as we prioritize the call backs  YOU WILL RECEIVE A CALL BACK THE SAME DAY AS LONG AS YOU CALL BEFORE 4:00 PM

## 2022-05-25 ENCOUNTER — Encounter (HOSPITAL_COMMUNITY): Payer: Self-pay | Admitting: Emergency Medicine

## 2022-05-25 ENCOUNTER — Emergency Department (HOSPITAL_COMMUNITY)
Admission: EM | Admit: 2022-05-25 | Discharge: 2022-05-25 | Disposition: A | Discharge status: Home / Self Care | Payer: Medicare Other | Attending: Emergency Medicine | Admitting: Emergency Medicine

## 2022-05-25 ENCOUNTER — Other Ambulatory Visit: Payer: Self-pay

## 2022-05-25 ENCOUNTER — Emergency Department (HOSPITAL_COMMUNITY): Payer: Medicare Other

## 2022-05-25 DIAGNOSIS — N289 Disorder of kidney and ureter, unspecified: Secondary | ICD-10-CM | POA: Diagnosis not present

## 2022-05-25 DIAGNOSIS — Z951 Presence of aortocoronary bypass graft: Secondary | ICD-10-CM | POA: Diagnosis not present

## 2022-05-25 DIAGNOSIS — Z7982 Long term (current) use of aspirin: Secondary | ICD-10-CM | POA: Insufficient documentation

## 2022-05-25 DIAGNOSIS — R079 Chest pain, unspecified: Secondary | ICD-10-CM

## 2022-05-25 DIAGNOSIS — I509 Heart failure, unspecified: Secondary | ICD-10-CM | POA: Insufficient documentation

## 2022-05-25 DIAGNOSIS — R0981 Nasal congestion: Secondary | ICD-10-CM | POA: Diagnosis not present

## 2022-05-25 DIAGNOSIS — Z7902 Long term (current) use of antithrombotics/antiplatelets: Secondary | ICD-10-CM | POA: Diagnosis not present

## 2022-05-25 DIAGNOSIS — R0789 Other chest pain: Secondary | ICD-10-CM | POA: Diagnosis not present

## 2022-05-25 DIAGNOSIS — Z1152 Encounter for screening for COVID-19: Secondary | ICD-10-CM | POA: Insufficient documentation

## 2022-05-25 DIAGNOSIS — I2510 Atherosclerotic heart disease of native coronary artery without angina pectoris: Secondary | ICD-10-CM | POA: Insufficient documentation

## 2022-05-25 DIAGNOSIS — R0602 Shortness of breath: Secondary | ICD-10-CM | POA: Diagnosis not present

## 2022-05-25 DIAGNOSIS — R531 Weakness: Secondary | ICD-10-CM | POA: Diagnosis not present

## 2022-05-25 LAB — CBC WITH DIFFERENTIAL/PLATELET
Abs Immature Granulocytes: 0.02 10*3/uL (ref 0.00–0.07)
Basophils Absolute: 0 10*3/uL (ref 0.0–0.1)
Basophils Relative: 0 %
Eosinophils Absolute: 0 10*3/uL (ref 0.0–0.5)
Eosinophils Relative: 1 %
HCT: 37 % — ABNORMAL LOW (ref 39.0–52.0)
Hemoglobin: 12.1 g/dL — ABNORMAL LOW (ref 13.0–17.0)
Immature Granulocytes: 0 %
Lymphocytes Relative: 5 %
Lymphs Abs: 0.3 10*3/uL — ABNORMAL LOW (ref 0.7–4.0)
MCH: 35.7 pg — ABNORMAL HIGH (ref 26.0–34.0)
MCHC: 32.7 g/dL (ref 30.0–36.0)
MCV: 109.1 fL — ABNORMAL HIGH (ref 80.0–100.0)
Monocytes Absolute: 0.5 10*3/uL (ref 0.1–1.0)
Monocytes Relative: 11 %
Neutro Abs: 4.1 10*3/uL (ref 1.7–7.7)
Neutrophils Relative %: 83 %
Platelets: 140 10*3/uL — ABNORMAL LOW (ref 150–400)
RBC: 3.39 MIL/uL — ABNORMAL LOW (ref 4.22–5.81)
RDW: 15.9 % — ABNORMAL HIGH (ref 11.5–15.5)
WBC: 4.9 10*3/uL (ref 4.0–10.5)
nRBC: 0 % (ref 0.0–0.2)

## 2022-05-25 LAB — COMPREHENSIVE METABOLIC PANEL
ALT: 20 U/L (ref 0–44)
AST: 30 U/L (ref 15–41)
Albumin: 3.3 g/dL — ABNORMAL LOW (ref 3.5–5.0)
Alkaline Phosphatase: 58 U/L (ref 38–126)
Anion gap: 15 (ref 5–15)
BUN: 60 mg/dL — ABNORMAL HIGH (ref 8–23)
CO2: 21 mmol/L — ABNORMAL LOW (ref 22–32)
Calcium: 8 mg/dL — ABNORMAL LOW (ref 8.9–10.3)
Chloride: 101 mmol/L (ref 98–111)
Creatinine, Ser: 3.47 mg/dL — ABNORMAL HIGH (ref 0.61–1.24)
GFR, Estimated: 17 mL/min — ABNORMAL LOW (ref >60)
Glucose, Bld: 119 mg/dL — ABNORMAL HIGH (ref 70–99)
Potassium: 3.7 mmol/L (ref 3.5–5.1)
Sodium: 137 mmol/L (ref 135–145)
Total Bilirubin: 0.8 mg/dL (ref 0.3–1.2)
Total Protein: 6.8 g/dL (ref 6.5–8.1)

## 2022-05-25 LAB — TROPONIN I (HIGH SENSITIVITY)
Troponin I (High Sensitivity): 27 ng/L — ABNORMAL HIGH (ref <18)
Troponin I (High Sensitivity): 25 ng/L — ABNORMAL HIGH (ref <18)

## 2022-05-25 LAB — RESP PANEL BY RT-PCR (RSV, FLU A&B, COVID)  RVPGX2
Influenza A by PCR: NEGATIVE
Influenza B by PCR: NEGATIVE
Resp Syncytial Virus by PCR: NEGATIVE
SARS Coronavirus 2 by RT PCR: NEGATIVE

## 2022-05-25 LAB — BRAIN NATRIURETIC PEPTIDE: B Natriuretic Peptide: 134.6 pg/mL — ABNORMAL HIGH (ref 0.0–100.0)

## 2022-05-25 MED ORDER — OXYMETAZOLINE HCL 0.05 % NA SOLN
1.0000 | Freq: Once | NASAL | Status: AC
  Administered 2022-05-25: 1 via NASAL
  Filled 2022-05-25: qty 30

## 2022-05-25 MED ORDER — DOXYCYCLINE HYCLATE 100 MG PO TABS
100.0000 mg | ORAL_TABLET | Freq: Once | ORAL | Status: AC
  Administered 2022-05-25: 100 mg via ORAL
  Filled 2022-05-25: qty 1

## 2022-05-25 MED ORDER — DOXYCYCLINE HYCLATE 100 MG PO CAPS: 100.0000 mg | ORAL_CAPSULE | Freq: Two times a day (BID) | ORAL | 0 refills | Status: DC

## 2022-05-25 NOTE — Discharge Instructions (Addendum)
Take doxycycline twice daily for a week for sinus congestion   Use afrin three times daily as needed   Your heart enzymes are stable right now   Follow up with cardiology   Return to ER if you have worse congestion, fever, chest pain, trouble breathing

## 2022-05-25 NOTE — ED Triage Notes (Signed)
Pt c/o weakness, shob, and chest pain that radiates into left shoulder starting today. Pt states he took 1 nitro at approx 1500 and it helped with the pain.

## 2022-05-25 NOTE — ED Notes (Signed)
RN alerted by PA that pt was dizzy in room. Pt stated he took another nitro. BP showed 63/50. RN and PA at bedside. IV access initiated and pt taken back to room by primary RN.

## 2022-05-25 NOTE — ED Provider Triage Note (Addendum)
Emergency Medicine Provider Triage Evaluation Note  Luke Mcbride , a 87 y.o. male  was evaluated in triage.  Pt complains of dizziness.  Pt just took a nitroglycerin for chest pain .  Review of Systems  Positive: weakness Negative: Fever   Physical Exam  BP 93/63   Pulse 63   Temp 97.6 F (36.4 C) (Oral)   Resp 16   Ht 6' (1.829 m)   Wt 96.2 kg   SpO2 97%   BMI 28.75 kg/m  Gen:   Awake, no distress   Resp:  Normal effort  MSK:   Moves extremities without difficulty  Other:    Medical Decision Making  Medically screening exam initiated at 5:28 PM.  Appropriate orders placed.  Luke Mcbride was informed that the remainder of the evaluation will be completed by another provider, this initial triage assessment does not replace that evaluation, and the importance of remaining in the ED until their evaluation is complete.    Pt's blood pressure 63/50.  Iv started.  Pt moved to a room    Fransico Meadow, PA-C 05/25/22 [place], [place] 05/25/22 1729

## 2022-05-25 NOTE — ED Provider Notes (Signed)
[facility] EMERGENCY DEPARTMENT Provider Note   CSN: [id number] Arrival date & time: 05/25/22  1643     History  Chief Complaint  Patient presents with   Chest Pain   Shortness of Breath    Luke Mcbride is a 87 y.o. male history of heart failure with a EF of 35%, CABG, stents here presenting with sinus congestion and shortness of breath and chest pain.  Patient has been having sinus congestion for the last several days.  He states that he felt very congested and has some subjective shortness of breath.  Patient states that since this afternoon, he had worsening shortness of breath and has some pain radiates to the left shoulder.  Patient took 1 nitro prior to arrival and took another 1 in triage and blood pressure went down to the 60s.  Patient states that his chest pain has resolved.  States that his leg swelling is stable.  The history is provided by the patient.       Home Medications Prior to Admission medications   Medication Sig Start Date End Date Taking? Authorizing Provider  acetaminophen (TYLENOL) 500 MG tablet Take 500 mg by mouth daily as needed for moderate pain or headache.     [provider]  amiodarone (PACERONE) 200 MG tablet Take 1 tablet (200 mg total) by mouth daily. 12/31/21   Larey Dresser, MD  aspirin EC 81 MG EC tablet Take 1 tablet (81 mg total) by mouth daily. 07/29/12   Theora Gianotti, NP  azaTHIOprine (IMURAN) 50 MG tablet Take 100 mg by mouth daily.    [provider]  clopidogrel (PLAVIX) 75 MG tablet TAKE 1 TABLET BY MOUTH  DAILY 12/24/21   Larey Dresser, MD  dorzolamide-timolol (COSOPT) 22.3-6.8 MG/ML ophthalmic solution Place 1 drop into both eyes 2 (two) times daily.     [provider]  empagliflozin (JARDIANCE) 10 MG TABS tablet Take 1 tablet (10 mg total) by mouth daily before breakfast. 02/04/22   Larey Dresser, MD  finasteride (PROSCAR) 5 MG tablet Take 5 mg by mouth at bedtime.      [provider]  levothyroxine (SYNTHROID, LEVOTHROID) 100 MCG tablet Take 100 mcg by mouth daily before breakfast.    [provider]  metoprolol succinate (TOPROL-XL) 50 MG 24 hr tablet Take 1.5 tablets (75 mg total) by mouth daily. 02/10/22   Larey Dresser, MD  Multiple Vitamin (MULTIVITAMIN) tablet Take 1 tablet by mouth daily.    [provider]  nitroGLYCERIN (NITROSTAT) 0.4 MG SL tablet Place 1 tablet (0.4 mg total) under the tongue every 5 (five) minutes as needed for chest pain. Patient not taking: Reported on 05/18/2022 11/24/21   Rafael Bihari, FNP  pantoprazole (PROTONIX) 40 MG tablet TAKE 1 TABLET BY MOUTH  DAILY 11/30/21   [name], MD  rosuvastatin (CRESTOR) 10 MG tablet Take 10 mg every morning by mouth. 03/22/17   [provider]  tamsulosin (FLOMAX) 0.4 MG CAPS capsule Take 0.4 mg by mouth at bedtime.    [provider]  torsemide (DEMADEX) 20 MG tablet Take 40 mg by mouth daily.    [provider]  Vericiguat (VERQUVO) 2.5 MG TABS Take 2.5 mg by mouth daily. Patient not taking: Reported on 04/05/2022 02/04/22   Larey Dresser, MD      Allergies    Patient has no known allergies.    Review of Systems   Review of Systems  Respiratory:  Positive for shortness of breath.   Cardiovascular:  Positive for chest pain.  All other systems reviewed and are negative.   Physical Exam Updated Vital Signs BP 131/63   Pulse (!) 53   Temp 97.6 F (36.4 C) (Oral)   Resp 11   Ht 6' (1.829 m)   Wt 96.2 kg   SpO2 98%   BMI 28.75 kg/m  Physical Exam Vitals and nursing note reviewed.  Constitutional:      Comments: Chronically ill and congested  HENT:     Head: Normocephalic.  Eyes:     Extraocular Movements: Extraocular movements intact.     Pupils: Pupils are equal, round, and reactive to light.  Cardiovascular:     Rate and Rhythm: Normal rate and regular rhythm.     Heart sounds: Normal heart sounds.   Pulmonary:     Comments: Tachypneic and diminished bilaterally. Abdominal:     General: Bowel sounds are normal.     Palpations: Abdomen is soft.  Musculoskeletal:     Cervical back: Normal range of motion and neck supple.     Comments: Trace edema bilaterally   Skin:    General: Skin is warm.     Capillary Refill: Capillary refill takes less than 2 seconds.  Neurological:     General: No focal deficit present.     Mental Status: He is oriented to person, place, and time.  Psychiatric:        Mood and Affect: Mood normal.        Behavior: Behavior normal.     ED Results / Procedures / Treatments   Labs (all labs ordered are listed, but only abnormal results are displayed) Labs Reviewed  RESP PANEL BY RT-PCR (RSV, FLU A&B, COVID)  [id number]  CBC WITH DIFFERENTIAL/PLATELET  COMPREHENSIVE METABOLIC PANEL  BRAIN NATRIURETIC PEPTIDE  TROPONIN I (HIGH SENSITIVITY)    EKG EKG Interpretation  Date/Time:  Tuesday May 25 2022 16:59:07 EST Ventricular Rate:  63 PR Interval:    QRS Duration: 114 QT Interval:  478 QTC Calculation: 489 R Axis:   64 Text Interpretation: Normal sinus rhythm ST & T wave abnormality, consider inferior ischemia Prolonged QT Abnormal ECG When compared with ECG of 02-Feb-2022 11:06, No significant change since last tracing Confirmed by Wandra Arthurs ([id number]) on 05/25/2022 5:45:16 PM  Radiology DG Chest Port 1 View  Result Date: 05/25/2022 CLINICAL DATA:  Weakness and shortness of breath EXAM: PORTABLE CHEST 1 VIEW COMPARISON:  01/29/2018 FINDINGS: Prior CABG. Borderline enlargement of the cardiopericardial silhouette. Of the lungs appear clear. No blunting of the costophrenic angles. IMPRESSION: 1. Borderline enlargement of the cardiopericardial silhouette, without edema. 2. Prior CABG. Electronically Signed   By: Van Clines M.D.   On: 05/25/2022 17:58    Procedures Procedures    Medications Ordered in ED Medications  oxymetazoline (AFRIN) 0.05  % nasal spray 1 spray (has no administration in time range)    ED Course/ Medical Decision Making/ A&P                           Medical Decision Making Luke Mcbride is a 87 y.o. male here presenting with chest pain and shortness of breath and sinus congestion.  I think he likely has viral syndrome.  Given his history of CAD and CABG and stents and heart failure, will need to rule out heart failure exacerbation versus ACS.  Plan to get CBC and  CMP and troponin and BNP and chest x-ray and COVID and flu and RSV test.  10:39 PM Patient's flu and RSV are negative.  Troponin was 27 and went down to 25.  His creatinine is baseline at 3.5.  I do not think he has ACS or unstable angina.  I think he likely has viral syndrome versus early sinusitis.  He states that he gets recurrent sinusitis a lot.  Given his kidney function, will will give him empiric course of doxycycline.  Problems Addressed: Renal insufficiency: chronic illness or injury Sinus congestion: acute illness or injury  Amount and/or Complexity of Data Reviewed Labs: ordered. Decision-making details documented in ED Course. Radiology: ordered and independent interpretation performed. Decision-making details documented in ED Course. ECG/medicine tests: ordered and independent interpretation performed. Decision-making details documented in ED Course.  Risk OTC drugs. Prescription drug management.    Final Clinical Impression(s) / ED Diagnoses Final diagnoses:  None    Rx / DC Orders ED Discharge Orders     None         Drenda Freeze, MD 05/25/22 2240

## 2022-06-08 ENCOUNTER — Other Ambulatory Visit (HOSPITAL_COMMUNITY): Payer: Self-pay | Admitting: Cardiology

## 2022-06-30 DIAGNOSIS — L570 Actinic keratosis: Secondary | ICD-10-CM | POA: Diagnosis not present

## 2022-06-30 DIAGNOSIS — X32XXXD Exposure to sunlight, subsequent encounter: Secondary | ICD-10-CM | POA: Diagnosis not present

## 2022-06-30 DIAGNOSIS — S80812A Abrasion, left lower leg, initial encounter: Secondary | ICD-10-CM | POA: Diagnosis not present

## 2022-06-30 DIAGNOSIS — D045 Carcinoma in situ of skin of trunk: Secondary | ICD-10-CM | POA: Diagnosis not present

## 2022-07-16 ENCOUNTER — Encounter (HOSPITAL_COMMUNITY): Payer: Self-pay | Admitting: Cardiology

## 2022-07-16 ENCOUNTER — Ambulatory Visit (HOSPITAL_COMMUNITY)
Admission: RE | Admit: 2022-07-16 | Discharge: 2022-07-16 | Disposition: A | Discharge status: Home / Self Care | Payer: Medicare Other | Source: Ambulatory Visit | Attending: Cardiology | Admitting: Cardiology

## 2022-07-16 VITALS — BP 116/74 | HR 64 | Wt 216.0 lb

## 2022-07-16 DIAGNOSIS — I776 Arteritis, unspecified: Secondary | ICD-10-CM | POA: Insufficient documentation

## 2022-07-16 DIAGNOSIS — I493 Ventricular premature depolarization: Secondary | ICD-10-CM | POA: Insufficient documentation

## 2022-07-16 DIAGNOSIS — I255 Ischemic cardiomyopathy: Secondary | ICD-10-CM | POA: Diagnosis not present

## 2022-07-16 DIAGNOSIS — N184 Chronic kidney disease, stage 4 (severe): Secondary | ICD-10-CM | POA: Insufficient documentation

## 2022-07-16 DIAGNOSIS — N059 Unspecified nephritic syndrome with unspecified morphologic changes: Secondary | ICD-10-CM | POA: Diagnosis not present

## 2022-07-16 DIAGNOSIS — I739 Peripheral vascular disease, unspecified: Secondary | ICD-10-CM | POA: Insufficient documentation

## 2022-07-16 DIAGNOSIS — E1122 Type 2 diabetes mellitus with diabetic chronic kidney disease: Secondary | ICD-10-CM | POA: Insufficient documentation

## 2022-07-16 DIAGNOSIS — I130 Hypertensive heart and chronic kidney disease with heart failure and stage 1 through stage 4 chronic kidney disease, or unspecified chronic kidney disease: Secondary | ICD-10-CM | POA: Insufficient documentation

## 2022-07-16 DIAGNOSIS — Z7984 Long term (current) use of oral hypoglycemic drugs: Secondary | ICD-10-CM | POA: Insufficient documentation

## 2022-07-16 DIAGNOSIS — I2510 Atherosclerotic heart disease of native coronary artery without angina pectoris: Secondary | ICD-10-CM | POA: Insufficient documentation

## 2022-07-16 DIAGNOSIS — Z79899 Other long term (current) drug therapy: Secondary | ICD-10-CM | POA: Diagnosis not present

## 2022-07-16 DIAGNOSIS — Z951 Presence of aortocoronary bypass graft: Secondary | ICD-10-CM | POA: Insufficient documentation

## 2022-07-16 DIAGNOSIS — I5022 Chronic systolic (congestive) heart failure: Secondary | ICD-10-CM | POA: Insufficient documentation

## 2022-07-16 DIAGNOSIS — Z955 Presence of coronary angioplasty implant and graft: Secondary | ICD-10-CM | POA: Insufficient documentation

## 2022-07-16 LAB — TSH: TSH: 2.575 u[IU]/mL (ref 0.350–4.500)

## 2022-07-16 LAB — IRON AND TIBC
Iron: 85 ug/dL (ref 45–182)
Saturation Ratios: 29 % (ref 17.9–39.5)
TIBC: 293 ug/dL (ref 250–450)
UIBC: 208 ug/dL

## 2022-07-16 LAB — COMPREHENSIVE METABOLIC PANEL
ALT: 16 U/L (ref 0–44)
AST: 21 U/L (ref 15–41)
Albumin: 3.5 g/dL (ref 3.5–5.0)
Alkaline Phosphatase: 50 U/L (ref 38–126)
Anion gap: 11 (ref 5–15)
BUN: 52 mg/dL — ABNORMAL HIGH (ref 8–23)
CO2: 26 mmol/L (ref 22–32)
Calcium: 8.8 mg/dL — ABNORMAL LOW (ref 8.9–10.3)
Chloride: 102 mmol/L (ref 98–111)
Creatinine, Ser: 3.24 mg/dL — ABNORMAL HIGH (ref 0.61–1.24)
GFR, Estimated: 18 mL/min — ABNORMAL LOW (ref >60)
Glucose, Bld: 97 mg/dL (ref 70–99)
Potassium: 4.4 mmol/L (ref 3.5–5.1)
Sodium: 139 mmol/L (ref 135–145)
Total Bilirubin: 0.6 mg/dL (ref 0.3–1.2)
Total Protein: 6.7 g/dL (ref 6.5–8.1)

## 2022-07-16 LAB — CBC
HCT: 37.6 % — ABNORMAL LOW (ref 39.0–52.0)
Hemoglobin: 12.1 g/dL — ABNORMAL LOW (ref 13.0–17.0)
MCH: 35.9 pg — ABNORMAL HIGH (ref 26.0–34.0)
MCHC: 32.2 g/dL (ref 30.0–36.0)
MCV: 111.6 fL — ABNORMAL HIGH (ref 80.0–100.0)
Platelets: 162 10*3/uL (ref 150–400)
RBC: 3.37 MIL/uL — ABNORMAL LOW (ref 4.22–5.81)
RDW: 16.2 % — ABNORMAL HIGH (ref 11.5–15.5)
WBC: 5.1 10*3/uL (ref 4.0–10.5)
nRBC: 0 % (ref 0.0–0.2)

## 2022-07-16 LAB — FERRITIN: Ferritin: 108 ng/mL (ref 24–336)

## 2022-07-16 MED ORDER — TORSEMIDE 20 MG PO TABS: ORAL_TABLET | ORAL | 3 refills | Status: DC

## 2022-07-16 NOTE — Patient Instructions (Signed)
INCREASE Torsemide to 60 mg daily, alternating with 40 mg daily.  Labs done today, your results will be available in MyChart, we will contact you for abnormal readings.  Repeat blood work in 10 days.  Your physician recommends that you schedule a follow-up appointment as scheduled.  If you have any questions or concerns before your next appointment please send [place] a message through [place] or call our office at [phone].    TO LEAVE A MESSAGE FOR THE NURSE SELECT OPTION 2, PLEASE LEAVE A MESSAGE INCLUDING: YOUR NAME DATE OF BIRTH CALL BACK NUMBER REASON FOR CALL**this is important as we prioritize the call backs  YOU WILL RECEIVE A CALL BACK THE SAME DAY AS LONG AS YOU CALL BEFORE 4:00 PM  At the [facility], you and your health needs are our priority. As part of our continuing mission to provide you with exceptional heart care, we have created designated Provider Care Teams. These Care Teams include your primary Cardiologist (physician) and Advanced Practice Providers (APPs- Physician Assistants and Nurse Practitioners) who all work together to provide you with the care you need, when you need it.   You may see any of the following providers on your designated Care Team at your next follow up: Dr Glori Bickers Dr Loralie Champagne Dr. Roxana Hires, NP Lyda Jester, [place] [facility] [place], [place] Forestine Na, NP Audry Riles, PharmD   Please be sure to bring in all your medications bottles to every appointment.    Thank you for choosing [facility]

## 2022-07-16 NOTE — Progress Notes (Signed)
ReDS Vest / Clip - 07/16/22 1100       ReDS Vest / Clip   Station Marker C    Ruler Value 29.5    ReDS Value Range Low volume    ReDS Actual Value 30

## 2022-07-18 NOTE — Progress Notes (Signed)
Nephrology: Patient, No Pcp Per  Cardiology: Dr. [name] HF Cardiology: Dr. Aundra Dubin  87 y.o. with history of CAD, ischemic cardiomyopathy, BOOP, pauci-immune vasculitis with CKD stage 3b, and diffuse large B cell lymphoma was referred by Dr. [name] for evaluation of CHF.  Patient had CABG in 2002.  In later years, he had PCI to native LAD and to sequential SVG-PDA/PLV.  LIMA is known to be atretic and SVG-D and sequential SVG-OM1/OM2 known to be occluded.  Most recent echo in 10/21 showed EF 35-40%.  Patient was diagnosed with pauci-immune vasculitis with glomerulonephritis by biopsy in 2008.  He is followed by Dr. Marval Regal and currently is on Imuran.  CKD stage 3b currently. He has been diagnosed by BOOP and used to be followed by pulmonary (not seen recently).  He was diagnosed with diffuse large B cell lymphoma in 3/18 and had 6 inches of his colon resected.  He was treated with chemotherapy.    Medical management of his cardiomyopathy has been limited by CKD stage 3b.    Echo 3/23 EF 35%, global hypokinesis, mildly decreased RV systolic function. Zio monitor in 7/23 showed 11.3% PVCs and 6 runs NSVT, amiodarone was started.    Today he returns for HF follow up with his daughter. Weight is stable. He has been more short of breath recently with exertion.  He is short of breath walking up a few stairs or walking around outside. Generally does ok in the house.  No chest pain.  Has 2 pillow orthopnea, no PND.  Per his daughter, his toes are dusky.  He has numb/tingly feet from peripheral neuropathy.    ECG (personally reviewed): NSR, low voltage Ps, LVH, inferior Qs  Labs (12/21): K 4.5, creatinine 2.35, pro-BNP 1426 Labs (5/22): K 4.3, creatinine 2.8 with GFR 22 Labs (6/22): K 4.3, creatinine 2.65 with GFR 23 Labs (8/22): LDL 78 Labs (12/22): K 4.3, creatinine 2.51 Labs (3/23): K 4.1, creatinine 2.57 Labs (7/23): K 4.6, creatinine 2.88 => 2.98, LDL 66, TGs 250 Labs (9/23): normal LFTs, normal  TSH Labs (10/23): K 4.5, creatinine 3.08 Labs (11/23): K 4.7, creatinine 3.47 Labs (12/23): K 4.6, creatinine 2.98 Labs (1/24): LFTs normal, K 3.7, creatinine 3.47  PMH: 1. Type 2 diabetes with peripheral neuropathy 2. HTN 3. Diffuse large B cell lymphoma: Diagnosed 3/18, 6 inches of colon resected and treated with chemotherapy.  4. BOOP: Followed by pulmonary  5. GERD with Barrett's esophagus.  6. Glomerulonephritis with vasculitis: Pauci-immune vasculitis. Has had associated pulmonary hemorrhage.  - Prior treatment with Cytoxan and plasmapheresis, currently on Imuran.  - CKD stage 4 7. Chronic systolic CHF: Ischemic cardiomyopathy.   - Echo (9/18): EF 35-40%.  - Echo (10/21) with EF 35-40%, mild LVH, severe LAE, normal RV - Echo (3/23): EF 35%, global hypokinesis, mildly decreased RV systolic function.  8. CAD: CABG x 6 in 1/02 with LIMA-LAD, SVG-D1, seq SVG-OM1/2, seq SVG-PDA/PLV.  - NSTEMI 3/14: BMS to seq SVG-PDA/PLV, LIMA atretic but good flow down LAD, SVG-OM1/2 and SVG-D occluded.  - Angina 11/18: PCI to 80% mid LAD stenosis, seq SVG-PDA/PLV remained patent, other grafts occluded (LIMA atretic).  9. PVCs: Zio in 7/23 showed 11.3% PVCs and 6 runs NSVT.   Social History   Socioeconomic History   Marital status: Married    Spouse name: Not on file   Number of children: Not on file   Years of education: Not on file   Highest education level: Not on file  Occupational History  Occupation: [employer]  Tobacco Use   Smoking status: Never   Smokeless tobacco: Never  Vaping Use   Vaping Use: Never used  Substance and Sexual Activity   Alcohol use: No   Drug use: No   Sexual activity: Not on file  Other Topics Concern   Not on file  Social History Narrative   Regular exercise - yes      [facility] (07/12/16):   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  no pets currently. No bird or mold exposure.   Social Determinants of Health   Financial  Resource Strain: Low Risk  (02/04/2022)   Overall Financial Resource Strain (CARDIA)    Difficulty of Paying Living Expenses: Not very hard  Food Insecurity: No Food Insecurity (02/04/2022)   Hunger Vital Sign    Worried About Running Out of Food in the Last Year: Never true    Ran Out of Food in the Last Year: Never true  Transportation Needs: No Transportation Needs (02/04/2022)   PRAPARE - Hydrologist (Medical): No    Lack of Transportation (Non-Medical): No  Physical Activity: Not on file  Stress: Not on file  Social Connections: Not on file  Intimate Partner Violence: Not on file   Family History  Problem Relation Age of Onset   Heart disease Mother    Diabetes Mother    Prostate cancer Father    Depression Other    Diabetes Other    Prostate cancer Other    Colon polyps Neg Hx    Colon cancer Neg Hx    Rectal cancer Neg Hx    Stomach cancer Neg Hx    Esophageal cancer Neg Hx    ROS: All systems reviewed and negative except as per HPI.   Current Outpatient Medications  Medication Sig Dispense Refill   acetaminophen (TYLENOL) 500 MG tablet Take 500 mg by mouth daily as needed for moderate pain or headache.      amiodarone (PACERONE) 200 MG tablet Take 1 tablet (200 mg total) by mouth daily. 90 tablet 3   azaTHIOprine (IMURAN) 50 MG tablet Take 100 mg by mouth daily.     clopidogrel (PLAVIX) 75 MG tablet TAKE 1 TABLET BY MOUTH  DAILY 90 tablet 3   dorzolamide-timolol (COSOPT) 22.3-6.8 MG/ML ophthalmic solution Place 1 drop into both eyes 2 (two) times daily.      empagliflozin (JARDIANCE) 10 MG TABS tablet Take 1 tablet (10 mg total) by mouth daily before breakfast. 90 tablet 3   finasteride (PROSCAR) 5 MG tablet Take 5 mg by mouth at bedtime.      levothyroxine (SYNTHROID, LEVOTHROID) 100 MCG tablet Take 100 mcg by mouth daily before breakfast.     metoprolol succinate (TOPROL-XL) 50 MG 24 hr tablet Take 1.5 tablets (75 mg total) by mouth daily.  135 tablet 3   Multiple Vitamin (MULTIVITAMIN) tablet Take 1 tablet by mouth daily.     nitroGLYCERIN (NITROSTAT) 0.4 MG SL tablet Place 1 tablet (0.4 mg total) under the tongue every 5 (five) minutes as needed for chest pain. 25 tablet 3   pantoprazole (PROTONIX) 40 MG tablet TAKE 1 TABLET BY MOUTH  DAILY 90 tablet 3   rosuvastatin (CRESTOR) 10 MG tablet Take 10 mg every morning by mouth.     tamsulosin (FLOMAX) 0.4 MG CAPS capsule Take 0.4 mg by mouth at bedtime.     torsemide (DEMADEX) 20 MG tablet 60 mg daily, alternating  with 40 mg daily. 200 tablet 3   No current facility-administered medications for this encounter.   Facility-Administered Medications Ordered in Other Encounters  Medication Dose Route Frequency Provider Last Rate Last Admin   sodium chloride flush (NS) 0.9 % injection 10 mL  10 mL Intracatheter PRN Brunetta Genera, MD   10 mL at 08/24/16 1653   sodium chloride flush (NS) 0.9 % injection 10 mL  10 mL Intracatheter PRN Brunetta Genera, MD   10 mL at 10/07/16 1555   Wt Readings from Last 3 Encounters:  07/16/22 98 kg (216 lb)  05/25/22 96.2 kg (212 lb)  05/18/22 98 kg (216 lb)   BP 116/74   Pulse 64   Wt 98 kg (216 lb)   SpO2 97%   BMI 29.29 kg/m  General: NAD Neck: JVP 8-9 cm with HJR, no thyromegaly or thyroid nodule.  Lungs: Clear to auscultation bilaterally with normal respiratory effort. CV: Nondisplaced PMI.  Heart regular S1/S2, no S3/S4, no murmur.  2+ edema 3/4 to knees.  No carotid bruit.  Unable to palpate pedal pulses.  Abdomen: Soft, nontender, no hepatosplenomegaly, no distention.  Skin: Intact without lesions or rashes.  Neurologic: Alert and oriented x 3.  Psych: Normal affect. Extremities: No clubbing or cyanosis.  HEENT: Normal.   Assessment/Plan: 1. Chronic systolic CHF: Ischemic cardiomyopathy. Echo in 10/21 with EF 35-40%, comparable to prior echo in 9/18.  Echo 3/23 with EF 35%, mildly decreased RV systolic function.  CHF is  complicated by CKD stage 4 which worsens fluid retention and also limits GDMT. NYHA III symptoms, I suspect mild volume overload on exam though creatinine also rising.   - Gentle increase of torsemide to 60 daily alternating with 40 daily.  BMET/BNP today and BMET in 10 days.  - Continue Jardiance 10 mg daily. - Continue Toprol XL 75 mg daily.  - He did not tolerate Verquvo (no specific complaint, "did not feel well on it.") - No ARB/ARNI/spironolactone with CKD stage 4.   - Not ICD candidate with age, narrow QRS so would not benefit from CRT.  2. CAD: S/p CABG 1/02 with subsequent PCI to seq SVG-PDA/PLV and later to native LAD.  He denies chest pain.   - Continue ASA 81 daily.  - Continue Crestor 10 mg daily. Good lipids in 7/23.  3. CKD stage 4: From pauci-immune vasculitis with glomerulonephritis.  - He is followed by Dr. Arty Baumgartner 4. PVCs: Zio in 7/23 with 11% PVCs and 6 NSVT runs.  - Continue amiodarone 200 mg daily.  Check LFTs and TSH, he will need regular eye exam. 5. PAD: Dusky toes and difficult to palpate pedal pulses.  - I will arrange for peripheral arterial dopplers.   Followup in 3 wks with APP to reassess volume and creatinine.   Loralie Champagne  07/18/2022

## 2022-07-23 ENCOUNTER — Other Ambulatory Visit (HOSPITAL_COMMUNITY): Payer: Self-pay | Admitting: Cardiology

## 2022-07-23 DIAGNOSIS — I739 Peripheral vascular disease, unspecified: Secondary | ICD-10-CM

## 2022-07-26 ENCOUNTER — Ambulatory Visit (HOSPITAL_COMMUNITY)
Admission: RE | Admit: 2022-07-26 | Discharge: 2022-07-26 | Disposition: A | Discharge status: Home / Self Care | Payer: Medicare Other | Source: Ambulatory Visit | Attending: Cardiology | Admitting: Cardiology

## 2022-07-26 DIAGNOSIS — I5022 Chronic systolic (congestive) heart failure: Secondary | ICD-10-CM | POA: Diagnosis not present

## 2022-07-26 LAB — BASIC METABOLIC PANEL
Anion gap: 15 (ref 5–15)
BUN: 53 mg/dL — ABNORMAL HIGH (ref 8–23)
CO2: 22 mmol/L (ref 22–32)
Calcium: 8.7 mg/dL — ABNORMAL LOW (ref 8.9–10.3)
Chloride: 102 mmol/L (ref 98–111)
Creatinine, Ser: 3.3 mg/dL — ABNORMAL HIGH (ref 0.61–1.24)
GFR, Estimated: 18 mL/min — ABNORMAL LOW (ref >60)
Glucose, Bld: 107 mg/dL — ABNORMAL HIGH (ref 70–99)
Potassium: 4.3 mmol/L (ref 3.5–5.1)
Sodium: 139 mmol/L (ref 135–145)

## 2022-07-28 DIAGNOSIS — J4481 Bronchiolitis obliterans and bronchiolitis obliterans syndrome: Secondary | ICD-10-CM | POA: Diagnosis not present

## 2022-07-28 DIAGNOSIS — N179 Acute kidney failure, unspecified: Secondary | ICD-10-CM | POA: Diagnosis not present

## 2022-07-28 DIAGNOSIS — I5042 Chronic combined systolic (congestive) and diastolic (congestive) heart failure: Secondary | ICD-10-CM | POA: Diagnosis not present

## 2022-07-28 DIAGNOSIS — I2510 Atherosclerotic heart disease of native coronary artery without angina pectoris: Secondary | ICD-10-CM | POA: Diagnosis not present

## 2022-07-28 DIAGNOSIS — R809 Proteinuria, unspecified: Secondary | ICD-10-CM | POA: Diagnosis not present

## 2022-07-28 DIAGNOSIS — N400 Enlarged prostate without lower urinary tract symptoms: Secondary | ICD-10-CM | POA: Diagnosis not present

## 2022-07-28 DIAGNOSIS — N057 Unspecified nephritic syndrome with diffuse crescentic glomerulonephritis: Secondary | ICD-10-CM | POA: Diagnosis not present

## 2022-07-28 DIAGNOSIS — N058 Unspecified nephritic syndrome with other morphologic changes: Secondary | ICD-10-CM | POA: Diagnosis not present

## 2022-07-28 DIAGNOSIS — E1129 Type 2 diabetes mellitus with other diabetic kidney complication: Secondary | ICD-10-CM | POA: Diagnosis not present

## 2022-07-28 DIAGNOSIS — N184 Chronic kidney disease, stage 4 (severe): Secondary | ICD-10-CM | POA: Diagnosis not present

## 2022-07-28 DIAGNOSIS — E7800 Pure hypercholesterolemia, unspecified: Secondary | ICD-10-CM | POA: Diagnosis not present

## 2022-07-28 DIAGNOSIS — I129 Hypertensive chronic kidney disease with stage 1 through stage 4 chronic kidney disease, or unspecified chronic kidney disease: Secondary | ICD-10-CM | POA: Diagnosis not present

## 2022-07-29 ENCOUNTER — Other Ambulatory Visit (HOSPITAL_COMMUNITY): Payer: Self-pay

## 2022-08-02 ENCOUNTER — Ambulatory Visit (HOSPITAL_COMMUNITY)
Admission: RE | Admit: 2022-08-02 | Discharge: 2022-08-02 | Disposition: A | Discharge status: Home / Self Care | Payer: Medicare Other | Source: Ambulatory Visit | Attending: Internal Medicine | Admitting: Internal Medicine

## 2022-08-02 DIAGNOSIS — I739 Peripheral vascular disease, unspecified: Secondary | ICD-10-CM | POA: Diagnosis not present

## 2022-08-03 DIAGNOSIS — H401211 Low-tension glaucoma, right eye, mild stage: Secondary | ICD-10-CM | POA: Diagnosis not present

## 2022-08-03 DIAGNOSIS — H44512 Absolute glaucoma, left eye: Secondary | ICD-10-CM | POA: Diagnosis not present

## 2022-08-03 LAB — VAS US ABI WITH/WO TBI
Left ABI: 1.01
Right ABI: 1

## 2022-08-05 ENCOUNTER — Encounter (HOSPITAL_COMMUNITY): Payer: Self-pay

## 2022-08-05 ENCOUNTER — Ambulatory Visit (HOSPITAL_COMMUNITY)
Admission: RE | Admit: 2022-08-05 | Discharge: 2022-08-05 | Disposition: A | Discharge status: Home / Self Care | Payer: Medicare Other | Source: Ambulatory Visit | Attending: Cardiology | Admitting: Cardiology

## 2022-08-05 VITALS — BP 116/68 | HR 59 | Wt 213.4 lb

## 2022-08-05 DIAGNOSIS — I2510 Atherosclerotic heart disease of native coronary artery without angina pectoris: Secondary | ICD-10-CM | POA: Insufficient documentation

## 2022-08-05 DIAGNOSIS — Z951 Presence of aortocoronary bypass graft: Secondary | ICD-10-CM | POA: Insufficient documentation

## 2022-08-05 DIAGNOSIS — Z79899 Other long term (current) drug therapy: Secondary | ICD-10-CM | POA: Diagnosis not present

## 2022-08-05 DIAGNOSIS — I5022 Chronic systolic (congestive) heart failure: Secondary | ICD-10-CM | POA: Insufficient documentation

## 2022-08-05 DIAGNOSIS — I739 Peripheral vascular disease, unspecified: Secondary | ICD-10-CM | POA: Diagnosis not present

## 2022-08-05 DIAGNOSIS — Z955 Presence of coronary angioplasty implant and graft: Secondary | ICD-10-CM | POA: Diagnosis not present

## 2022-08-05 DIAGNOSIS — Z7984 Long term (current) use of oral hypoglycemic drugs: Secondary | ICD-10-CM | POA: Insufficient documentation

## 2022-08-05 DIAGNOSIS — E1122 Type 2 diabetes mellitus with diabetic chronic kidney disease: Secondary | ICD-10-CM | POA: Diagnosis not present

## 2022-08-05 DIAGNOSIS — Z5181 Encounter for therapeutic drug level monitoring: Secondary | ICD-10-CM

## 2022-08-05 DIAGNOSIS — I130 Hypertensive heart and chronic kidney disease with heart failure and stage 1 through stage 4 chronic kidney disease, or unspecified chronic kidney disease: Secondary | ICD-10-CM | POA: Insufficient documentation

## 2022-08-05 DIAGNOSIS — N184 Chronic kidney disease, stage 4 (severe): Secondary | ICD-10-CM | POA: Insufficient documentation

## 2022-08-05 DIAGNOSIS — I255 Ischemic cardiomyopathy: Secondary | ICD-10-CM | POA: Insufficient documentation

## 2022-08-05 DIAGNOSIS — I493 Ventricular premature depolarization: Secondary | ICD-10-CM | POA: Insufficient documentation

## 2022-08-05 NOTE — Progress Notes (Signed)
ReDS Vest / Clip - 08/05/22 1400       ReDS Vest / Clip   Station Marker C    Ruler Value 26    ReDS Value Range Low volume    ReDS Actual Value 32

## 2022-08-05 NOTE — Progress Notes (Signed)
Nephrology: Patient, No Pcp Per  Cardiology: Dr. [name] HF Cardiology: Dr. Aundra Dubin  87 y.o. with history of CAD, ischemic cardiomyopathy, BOOP, pauci-immune vasculitis with CKD stage 3b, and diffuse large B cell lymphoma was referred by Dr. [name] for evaluation of CHF.  Patient had CABG in 2002.  In later years, he had PCI to native LAD and to sequential SVG-PDA/PLV.  LIMA is known to be atretic and SVG-D and sequential SVG-OM1/OM2 known to be occluded.  Most recent echo in 10/21 showed EF 35-40%.  Patient was diagnosed with pauci-immune vasculitis with glomerulonephritis by biopsy in 2008.  He is followed by Dr. Marval Regal and currently is on Imuran.  CKD stage 3b currently. He has been diagnosed by BOOP and used to be followed by pulmonary (not seen recently).  He was diagnosed with diffuse large B cell lymphoma in 3/18 and had 6 inches of his colon resected.  He was treated with chemotherapy.    Medical management of his cardiomyopathy has been limited by CKD stage 3b.    Echo 3/23 EF 35%, global hypokinesis, mildly decreased RV systolic function. Zio monitor in 7/23 showed 11.3% PVCs and 6 runs NSVT, amiodarone was started.    Seen earlier this month in clinic and was noted to be more SOB w/ exertion. + 2 pillow orthopnea. No PND. Noted to be fluid overloaded on exam and torsemide was increased to to 60 daily alternating with 40 daily. He was also ordered to get LE arterial dopplers given concern for PAD and symptoms of claudication. This showed abnormal TBIs but ABIs normal (Rt 1.00, Lt 1.01). Medical management recommended.   He now presents back for reassessment of volume status. Here w/ daughter. Says LEE has improved but still SOB w/ certain activities. He does ok ambulating around his house but SOB walking longer distances and up inclines. Wt down 3 lb from previous visit. ReDs 32%. No chest pain. HR and BP stable. He is on chronic amiodarone for PVCs. He has seen nephrology since his last OV  here and renal fx stable.    ECG: not performed  Labs (12/21): K 4.5, creatinine 2.35, pro-BNP 1426 Labs (5/22): K 4.3, creatinine 2.8 with GFR 22 Labs (6/22): K 4.3, creatinine 2.65 with GFR 23 Labs (8/22): LDL 78 Labs (12/22): K 4.3, creatinine 2.51 Labs (3/23): K 4.1, creatinine 2.57 Labs (7/23): K 4.6, creatinine 2.88 => 2.98, LDL 66, TGs 250 Labs (9/23): normal LFTs, normal TSH Labs (10/23): K 4.5, creatinine 3.08 Labs (11/23): K 4.7, creatinine 3.47 Labs (12/23): K 4.6, creatinine 2.98 Labs (3/24): LFTs normal, TSH normal, K 3.7, creatinine 3.47   PMH: 1. Type 2 diabetes with peripheral neuropathy 2. HTN 3. Diffuse large B cell lymphoma: Diagnosed 3/18, 6 inches of colon resected and treated with chemotherapy.  4. BOOP: Followed by pulmonary  5. GERD with Barrett's esophagus.  6. Glomerulonephritis with vasculitis: Pauci-immune vasculitis. Has had associated pulmonary hemorrhage.  - Prior treatment with Cytoxan and plasmapheresis, currently on Imuran.  - CKD stage 4 7. Chronic systolic CHF: Ischemic cardiomyopathy.   - Echo (9/18): EF 35-40%.  - Echo (10/21) with EF 35-40%, mild LVH, severe LAE, normal RV - Echo (3/23): EF 35%, global hypokinesis, mildly decreased RV systolic function.  8. CAD: CABG x 6 in 1/02 with LIMA-LAD, SVG-D1, seq SVG-OM1/2, seq SVG-PDA/PLV.  - NSTEMI 3/14: BMS to seq SVG-PDA/PLV, LIMA atretic but good flow down LAD, SVG-OM1/2 and SVG-D occluded.  - Angina 11/18: PCI to 80% mid LAD stenosis,  seq SVG-PDA/PLV remained patent, other grafts occluded (LIMA atretic).  9. PVCs: Zio in 7/23 showed 11.3% PVCs and 6 runs NSVT.   Social History   Socioeconomic History   Marital status: Married    Spouse name: Not on file   Number of children: Not on file   Years of education: Not on file   Highest education level: Not on file  Occupational History   Occupation: [employer]  Tobacco Use   Smoking status: Never   Smokeless tobacco: Never  Vaping Use    Vaping Use: Never used  Substance and Sexual Activity   Alcohol use: No   Drug use: No   Sexual activity: Not on file  Other Topics Concern   Not on file  Social History Narrative   Regular exercise - yes      [facility] (07/12/16):   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  no pets currently. No bird or mold exposure.   Social Determinants of Health   Financial Resource Strain: Low Risk  (02/04/2022)   Overall Financial Resource Strain (CARDIA)    Difficulty of Paying Living Expenses: Not very hard  Food Insecurity: No Food Insecurity (02/04/2022)   Hunger Vital Sign    Worried About Running Out of Food in the Last Year: Never true    Ran Out of Food in the Last Year: Never true  Transportation Needs: No Transportation Needs (02/04/2022)   PRAPARE - Hydrologist (Medical): No    Lack of Transportation (Non-Medical): No  Physical Activity: Not on file  Stress: Not on file  Social Connections: Not on file  Intimate Partner Violence: Not on file   Family History  Problem Relation Age of Onset   Heart disease Mother    Diabetes Mother    Prostate cancer Father    Depression Other    Diabetes Other    Prostate cancer Other    Colon polyps Neg Hx    Colon cancer Neg Hx    Rectal cancer Neg Hx    Stomach cancer Neg Hx    Esophageal cancer Neg Hx    ROS: All systems reviewed and negative except as per HPI.   Current Outpatient Medications  Medication Sig Dispense Refill   acetaminophen (TYLENOL) 500 MG tablet Take 500 mg by mouth daily as needed for moderate pain or headache.      amiodarone (PACERONE) 200 MG tablet Take 1 tablet (200 mg total) by mouth daily. 90 tablet 3   azaTHIOprine (IMURAN) 50 MG tablet Take 100 mg by mouth daily.     clopidogrel (PLAVIX) 75 MG tablet TAKE 1 TABLET BY MOUTH  DAILY 90 tablet 3   dorzolamide-timolol (COSOPT) 22.3-6.8 MG/ML ophthalmic solution Place 1 drop into both eyes 2 (two)  times daily.      empagliflozin (JARDIANCE) 10 MG TABS tablet Take 1 tablet (10 mg total) by mouth daily before breakfast. 90 tablet 3   finasteride (PROSCAR) 5 MG tablet Take 5 mg by mouth at bedtime.      levothyroxine (SYNTHROID, LEVOTHROID) 100 MCG tablet Take 100 mcg by mouth daily before breakfast.     metoprolol succinate (TOPROL-XL) 50 MG 24 hr tablet Take 1.5 tablets (75 mg total) by mouth daily. 135 tablet 3   Multiple Vitamin (MULTIVITAMIN) tablet Take 1 tablet by mouth daily.     nitroGLYCERIN (NITROSTAT) 0.4 MG SL tablet Place 1 tablet (0.4 mg total) under the tongue every  5 (five) minutes as needed for chest pain. 25 tablet 3   pantoprazole (PROTONIX) 40 MG tablet TAKE 1 TABLET BY MOUTH  DAILY 90 tablet 3   rosuvastatin (CRESTOR) 10 MG tablet Take 10 mg every morning by mouth.     tamsulosin (FLOMAX) 0.4 MG CAPS capsule Take 0.4 mg by mouth at bedtime.     torsemide (DEMADEX) 20 MG tablet 60 mg daily, alternating with 40 mg daily. 200 tablet 3   No current facility-administered medications for this encounter.   Facility-Administered Medications Ordered in Other Encounters  Medication Dose Route Frequency Provider Last Rate Last Admin   sodium chloride flush (NS) 0.9 % injection 10 mL  10 mL Intracatheter PRN Brunetta Genera, MD   10 mL at 08/24/16 1653   sodium chloride flush (NS) 0.9 % injection 10 mL  10 mL Intracatheter PRN Brunetta Genera, MD   10 mL at 10/07/16 1555   Wt Readings from Last 3 Encounters:  08/05/22 96.8 kg (213 lb 6.4 oz)  07/16/22 98 kg (216 lb)  05/25/22 96.2 kg (212 lb)   BP 116/68   Pulse (!) 59   Wt 96.8 kg (213 lb 6.4 oz)   SpO2 96%   BMI 28.94 kg/m  PHYSICAL EXAM: ReDs 32%  General:  Well appearing. No respiratory difficulty HEENT: normal Neck: supple. no JVD. Carotids 2+ bilat; no bruits. No lymphadenopathy or thyromegaly appreciated. Cor: PMI nondisplaced. Regular rate & rhythm. No rubs, gallops or murmurs. Lungs:  clear Abdomen: soft, nontender, nondistended. No hepatosplenomegaly. No bruits or masses. Good bowel sounds. Extremities: no cyanosis, clubbing, rash, edema Neuro: alert & oriented x 3, cranial nerves grossly intact. moves all 4 extremities w/o difficulty. Affect pleasant.    Assessment/Plan: 1. Chronic systolic CHF: Ischemic cardiomyopathy. Echo in 10/21 with EF 35-40%, comparable to prior echo in 9/18.  Echo 3/23 with EF 35%, mildly decreased RV systolic function.  CHF is complicated by CKD stage 4 which worsens fluid retention and also limits GDMT. Chronic stable NYHA III symptoms, confounded by deconditioning. Physical activity overall limited by leg discomfort/neuropathy. ReDs normal at 32%.   - Continue torsemide 60 daily alternating with 40 daily. Labs followed by nephrology and recently stable  - Continue Jardiance 10 mg daily. - Continue Toprol XL 75 mg daily.  - He did not tolerate Verquvo (no specific complaint, "did not feel well on it.") - No ARB/ARNI/spironolactone with CKD stage 4.   - Not ICD candidate with age, narrow QRS so would not benefit from CRT.  2. CAD: S/p CABG 1/02 with subsequent PCI to seq SVG-PDA/PLV and later to native LAD. Stable w/o CP - Continue ASA 81 daily.  - Continue Crestor 10 mg daily. Good lipids in 7/23.  3. CKD stage 4: From pauci-immune vasculitis with glomerulonephritis. B/l SCr ~3.3 - He is followed by Dr. Arty Baumgartner 4. PVCs: Zio in 7/23 with 11% PVCs and 6 NSVT runs.  - Continue amiodarone 200 mg daily.  Recent LFTs and TSH WNL. He will need regular eye exam. Given dyspnea and normal volume status, will check PFTs w/ DLCO (will need annually going forward)  5. PAD: recent peripheral arterial dopplers 3/18 demonstrated abnormal TBIs but ABIs normal (Rt 1.00, Lt 1.01).   - Medical management.   F/u w/ Dr. Aundra Dubin in 3-4 months  Lyda Jester, PA-C  08/05/2022

## 2022-08-05 NOTE — Patient Instructions (Signed)
It was great to see you today! No medication changes are needed at this time.  Your physician has recommended that you have a pulmonary function test. Pulmonary Function Tests are a group of tests that measure how well air moves in and out of your lungs.   Your physician recommends that you schedule a follow-up appointment in: 3-4 months with Dr Aundra Dubin   Do the following things EVERYDAY: Weigh yourself in the morning before breakfast. Write it down and keep it in a log. Take your medicines as prescribed Eat low salt foods--Limit salt (sodium) to 2000 mg per day.  Stay as active as you can everyday Limit all fluids for the day to less than 2 liters   At the [facility], you and your health needs are our priority. As part of our continuing mission to provide you with exceptional heart care, we have created designated Provider Care Teams. These Care Teams include your primary Cardiologist (physician) and Advanced Practice Providers (APPs- Physician Assistants and Nurse Practitioners) who all work together to provide you with the care you need, when you need it.   You may see any of the following providers on your designated Care Team at your next follow up: Dr Glori Bickers Dr Loralie Champagne Dr. Roxana Hires, NP Lyda Jester, [place] [facility] [place], [place] Forestine Na, NP Audry Riles, PharmD   Please be sure to bring in all your medications bottles to every appointment.    Thank you for choosing [facility]  If you have any questions or concerns before your next appointment please send [place] a message through [facility] or call our office at [phone].    TO LEAVE A MESSAGE FOR THE NURSE SELECT OPTION 2, PLEASE LEAVE A MESSAGE INCLUDING: YOUR NAME DATE OF BIRTH CALL BACK NUMBER REASON FOR CALL**this is important as we prioritize the call backs  YOU WILL RECEIVE A CALL BACK THE SAME DAY AS LONG  AS YOU CALL BEFORE 4:00 PM

## 2022-08-06 ENCOUNTER — Other Ambulatory Visit (HOSPITAL_COMMUNITY): Payer: Self-pay

## 2022-08-09 ENCOUNTER — Other Ambulatory Visit: Payer: Self-pay

## 2022-08-09 ENCOUNTER — Encounter (HOSPITAL_COMMUNITY): Payer: Medicare Other

## 2022-08-11 ENCOUNTER — Ambulatory Visit (HOSPITAL_COMMUNITY)
Admission: RE | Admit: 2022-08-11 | Discharge: 2022-08-11 | Disposition: A | Discharge status: Home / Self Care | Payer: Medicare Other | Source: Ambulatory Visit | Attending: Cardiology | Admitting: Cardiology

## 2022-08-11 DIAGNOSIS — Z79899 Other long term (current) drug therapy: Secondary | ICD-10-CM | POA: Diagnosis not present

## 2022-08-11 DIAGNOSIS — Z5181 Encounter for therapeutic drug level monitoring: Secondary | ICD-10-CM | POA: Diagnosis not present

## 2022-08-11 LAB — PULMONARY FUNCTION TEST
DL/VA % pred: 76 %
DL/VA: 2.91 ml/min/mmHg/L
DLCO cor % pred: 62 %
DLCO cor: 15.91 ml/min/mmHg
DLCO unc % pred: 57 %
DLCO unc: 14.67 ml/min/mmHg
FEF 25-75 Post: 3.23 L/sec
FEF 25-75 Pre: 3.02 L/sec
FEF2575-%Change-Post: 6 %
FEF2575-%Pred-Post: 164 %
FEF2575-%Pred-Pre: 153 %
FEV1-%Change-Post: 1 %
FEV1-%Pred-Post: 98 %
FEV1-%Pred-Pre: 97 %
FEV1-Post: 2.92 L
FEV1-Pre: 2.88 L
FEV1FVC-%Change-Post: 2 %
FEV1FVC-%Pred-Pre: 112 %
FEV6-%Change-Post: 0 %
FEV6-%Pred-Post: 91 %
FEV6-%Pred-Pre: 91 %
FEV6-Post: 3.57 L
FEV6-Pre: 3.57 L
FEV6FVC-%Change-Post: 1 %
FEV6FVC-%Pred-Post: 107 %
FEV6FVC-%Pred-Pre: 105 %
FVC-%Change-Post: -1 %
FVC-%Pred-Post: 85 %
FVC-%Pred-Pre: 86 %
FVC-Post: 3.57 L
FVC-Pre: 3.62 L
Post FEV1/FVC ratio: 82 %
Post FEV6/FVC ratio: 100 %
Pre FEV1/FVC ratio: 80 %
Pre FEV6/FVC Ratio: 99 %
RV % pred: 87 %
RV: 2.49 L
TLC % pred: 83 %
TLC: 6.21 L

## 2022-08-11 MED ORDER — ALBUTEROL SULFATE (2.5 MG/3ML) 0.083% IN NEBU
2.5000 mg | INHALATION_SOLUTION | Freq: Once | RESPIRATORY_TRACT | Status: AC
  Administered 2022-08-11: 2.5 mg via RESPIRATORY_TRACT

## 2022-08-31 DIAGNOSIS — C44629 Squamous cell carcinoma of skin of left upper limb, including shoulder: Secondary | ICD-10-CM | POA: Diagnosis not present

## 2022-08-31 DIAGNOSIS — Z08 Encounter for follow-up examination after completed treatment for malignant neoplasm: Secondary | ICD-10-CM | POA: Diagnosis not present

## 2022-08-31 DIAGNOSIS — Z85828 Personal history of other malignant neoplasm of skin: Secondary | ICD-10-CM | POA: Diagnosis not present

## 2022-10-27 ENCOUNTER — Other Ambulatory Visit: Payer: Self-pay

## 2022-10-28 ENCOUNTER — Other Ambulatory Visit: Payer: Self-pay

## 2022-10-29 DIAGNOSIS — N179 Acute kidney failure, unspecified: Secondary | ICD-10-CM | POA: Diagnosis not present

## 2022-10-29 DIAGNOSIS — I5042 Chronic combined systolic (congestive) and diastolic (congestive) heart failure: Secondary | ICD-10-CM | POA: Diagnosis not present

## 2022-10-29 DIAGNOSIS — E1122 Type 2 diabetes mellitus with diabetic chronic kidney disease: Secondary | ICD-10-CM | POA: Diagnosis not present

## 2022-10-29 DIAGNOSIS — I2510 Atherosclerotic heart disease of native coronary artery without angina pectoris: Secondary | ICD-10-CM | POA: Diagnosis not present

## 2022-10-29 DIAGNOSIS — N184 Chronic kidney disease, stage 4 (severe): Secondary | ICD-10-CM | POA: Diagnosis not present

## 2022-10-29 DIAGNOSIS — R059 Cough, unspecified: Secondary | ICD-10-CM | POA: Diagnosis not present

## 2022-10-29 DIAGNOSIS — R809 Proteinuria, unspecified: Secondary | ICD-10-CM | POA: Diagnosis not present

## 2022-10-29 DIAGNOSIS — N057 Unspecified nephritic syndrome with diffuse crescentic glomerulonephritis: Secondary | ICD-10-CM | POA: Diagnosis not present

## 2022-10-29 DIAGNOSIS — I129 Hypertensive chronic kidney disease with stage 1 through stage 4 chronic kidney disease, or unspecified chronic kidney disease: Secondary | ICD-10-CM | POA: Diagnosis not present

## 2022-10-29 DIAGNOSIS — E7800 Pure hypercholesterolemia, unspecified: Secondary | ICD-10-CM | POA: Diagnosis not present

## 2022-10-29 DIAGNOSIS — N058 Unspecified nephritic syndrome with other morphologic changes: Secondary | ICD-10-CM | POA: Diagnosis not present

## 2022-10-29 DIAGNOSIS — N400 Enlarged prostate without lower urinary tract symptoms: Secondary | ICD-10-CM | POA: Diagnosis not present

## 2022-11-02 DIAGNOSIS — Z08 Encounter for follow-up examination after completed treatment for malignant neoplasm: Secondary | ICD-10-CM | POA: Diagnosis not present

## 2022-11-02 DIAGNOSIS — L570 Actinic keratosis: Secondary | ICD-10-CM | POA: Diagnosis not present

## 2022-11-02 DIAGNOSIS — X32XXXD Exposure to sunlight, subsequent encounter: Secondary | ICD-10-CM | POA: Diagnosis not present

## 2022-11-02 DIAGNOSIS — D044 Carcinoma in situ of skin of scalp and neck: Secondary | ICD-10-CM | POA: Diagnosis not present

## 2022-11-02 DIAGNOSIS — Z85828 Personal history of other malignant neoplasm of skin: Secondary | ICD-10-CM | POA: Diagnosis not present

## 2022-11-04 ENCOUNTER — Other Ambulatory Visit: Payer: Self-pay

## 2022-11-20 ENCOUNTER — Encounter (HOSPITAL_COMMUNITY): Payer: Self-pay | Admitting: *Deleted

## 2022-11-20 ENCOUNTER — Encounter: Payer: Self-pay | Admitting: Hematology

## 2022-11-20 ENCOUNTER — Emergency Department (HOSPITAL_BASED_OUTPATIENT_CLINIC_OR_DEPARTMENT_OTHER): Payer: Medicare Other

## 2022-11-20 ENCOUNTER — Encounter (HOSPITAL_BASED_OUTPATIENT_CLINIC_OR_DEPARTMENT_OTHER): Payer: Self-pay

## 2022-11-20 ENCOUNTER — Inpatient Hospital Stay (HOSPITAL_BASED_OUTPATIENT_CLINIC_OR_DEPARTMENT_OTHER)
Admission: EM | Admit: 2022-11-20 | Discharge: 2022-11-23 | DRG: 291 | Disposition: A | Discharge status: Hospice | Payer: Medicare Other | Attending: Internal Medicine | Admitting: Internal Medicine

## 2022-11-20 ENCOUNTER — Other Ambulatory Visit: Payer: Self-pay

## 2022-11-20 DIAGNOSIS — I2489 Other forms of acute ischemic heart disease: Secondary | ICD-10-CM | POA: Diagnosis present

## 2022-11-20 DIAGNOSIS — I25118 Atherosclerotic heart disease of native coronary artery with other forms of angina pectoris: Secondary | ICD-10-CM | POA: Diagnosis not present

## 2022-11-20 DIAGNOSIS — E1169 Type 2 diabetes mellitus with other specified complication: Secondary | ICD-10-CM | POA: Diagnosis not present

## 2022-11-20 DIAGNOSIS — R531 Weakness: Secondary | ICD-10-CM | POA: Diagnosis not present

## 2022-11-20 DIAGNOSIS — K2270 Barrett's esophagus without dysplasia: Secondary | ICD-10-CM | POA: Diagnosis present

## 2022-11-20 DIAGNOSIS — Z515 Encounter for palliative care: Secondary | ICD-10-CM | POA: Diagnosis not present

## 2022-11-20 DIAGNOSIS — I776 Arteritis, unspecified: Secondary | ICD-10-CM

## 2022-11-20 DIAGNOSIS — Z7401 Bed confinement status: Secondary | ICD-10-CM | POA: Diagnosis not present

## 2022-11-20 DIAGNOSIS — E1142 Type 2 diabetes mellitus with diabetic polyneuropathy: Secondary | ICD-10-CM | POA: Diagnosis present

## 2022-11-20 DIAGNOSIS — I509 Heart failure, unspecified: Secondary | ICD-10-CM | POA: Diagnosis present

## 2022-11-20 DIAGNOSIS — W19XXXA Unspecified fall, initial encounter: Secondary | ICD-10-CM | POA: Diagnosis not present

## 2022-11-20 DIAGNOSIS — Z7984 Long term (current) use of oral hypoglycemic drugs: Secondary | ICD-10-CM

## 2022-11-20 DIAGNOSIS — I255 Ischemic cardiomyopathy: Secondary | ICD-10-CM | POA: Diagnosis present

## 2022-11-20 DIAGNOSIS — M25511 Pain in right shoulder: Secondary | ICD-10-CM | POA: Diagnosis not present

## 2022-11-20 DIAGNOSIS — D649 Anemia, unspecified: Secondary | ICD-10-CM | POA: Diagnosis not present

## 2022-11-20 DIAGNOSIS — I5084 End stage heart failure: Secondary | ICD-10-CM | POA: Diagnosis present

## 2022-11-20 DIAGNOSIS — E1122 Type 2 diabetes mellitus with diabetic chronic kidney disease: Secondary | ICD-10-CM | POA: Diagnosis present

## 2022-11-20 DIAGNOSIS — J90 Pleural effusion, not elsewhere classified: Secondary | ICD-10-CM | POA: Diagnosis not present

## 2022-11-20 DIAGNOSIS — E785 Hyperlipidemia, unspecified: Secondary | ICD-10-CM | POA: Diagnosis present

## 2022-11-20 DIAGNOSIS — Z79899 Other long term (current) drug therapy: Secondary | ICD-10-CM | POA: Diagnosis not present

## 2022-11-20 DIAGNOSIS — Z1152 Encounter for screening for COVID-19: Secondary | ICD-10-CM | POA: Diagnosis not present

## 2022-11-20 DIAGNOSIS — N179 Acute kidney failure, unspecified: Secondary | ICD-10-CM | POA: Diagnosis not present

## 2022-11-20 DIAGNOSIS — Z79890 Hormone replacement therapy (postmenopausal): Secondary | ICD-10-CM

## 2022-11-20 DIAGNOSIS — I214 Non-ST elevation (NSTEMI) myocardial infarction: Secondary | ICD-10-CM | POA: Diagnosis not present

## 2022-11-20 DIAGNOSIS — Z66 Do not resuscitate: Secondary | ICD-10-CM | POA: Diagnosis not present

## 2022-11-20 DIAGNOSIS — S4291XA Fracture of right shoulder girdle, part unspecified, initial encounter for closed fracture: Secondary | ICD-10-CM | POA: Diagnosis not present

## 2022-11-20 DIAGNOSIS — Z7902 Long term (current) use of antithrombotics/antiplatelets: Secondary | ICD-10-CM

## 2022-11-20 DIAGNOSIS — Z9841 Cataract extraction status, right eye: Secondary | ICD-10-CM

## 2022-11-20 DIAGNOSIS — J849 Interstitial pulmonary disease, unspecified: Secondary | ICD-10-CM

## 2022-11-20 DIAGNOSIS — N183 Chronic kidney disease, stage 3 unspecified: Secondary | ICD-10-CM | POA: Diagnosis not present

## 2022-11-20 DIAGNOSIS — N184 Chronic kidney disease, stage 4 (severe): Secondary | ICD-10-CM | POA: Diagnosis not present

## 2022-11-20 DIAGNOSIS — Z833 Family history of diabetes mellitus: Secondary | ICD-10-CM

## 2022-11-20 DIAGNOSIS — I472 Ventricular tachycardia, unspecified: Secondary | ICD-10-CM | POA: Diagnosis present

## 2022-11-20 DIAGNOSIS — I440 Atrioventricular block, first degree: Secondary | ICD-10-CM | POA: Diagnosis present

## 2022-11-20 DIAGNOSIS — Z9221 Personal history of antineoplastic chemotherapy: Secondary | ICD-10-CM

## 2022-11-20 DIAGNOSIS — Z818 Family history of other mental and behavioral disorders: Secondary | ICD-10-CM

## 2022-11-20 DIAGNOSIS — I517 Cardiomegaly: Secondary | ICD-10-CM | POA: Diagnosis not present

## 2022-11-20 DIAGNOSIS — Z8042 Family history of malignant neoplasm of prostate: Secondary | ICD-10-CM

## 2022-11-20 DIAGNOSIS — Z8249 Family history of ischemic heart disease and other diseases of the circulatory system: Secondary | ICD-10-CM

## 2022-11-20 DIAGNOSIS — N400 Enlarged prostate without lower urinary tract symptoms: Secondary | ICD-10-CM | POA: Diagnosis present

## 2022-11-20 DIAGNOSIS — R7989 Other specified abnormal findings of blood chemistry: Secondary | ICD-10-CM

## 2022-11-20 DIAGNOSIS — R0602 Shortness of breath: Secondary | ICD-10-CM | POA: Diagnosis not present

## 2022-11-20 DIAGNOSIS — I5043 Acute on chronic combined systolic (congestive) and diastolic (congestive) heart failure: Secondary | ICD-10-CM | POA: Diagnosis not present

## 2022-11-20 DIAGNOSIS — Z9842 Cataract extraction status, left eye: Secondary | ICD-10-CM

## 2022-11-20 DIAGNOSIS — F0390 Unspecified dementia without behavioral disturbance: Secondary | ICD-10-CM | POA: Diagnosis present

## 2022-11-20 DIAGNOSIS — Z951 Presence of aortocoronary bypass graft: Secondary | ICD-10-CM

## 2022-11-20 DIAGNOSIS — K219 Gastro-esophageal reflux disease without esophagitis: Secondary | ICD-10-CM | POA: Diagnosis present

## 2022-11-20 DIAGNOSIS — I7789 Other specified disorders of arteries and arterioles: Secondary | ICD-10-CM | POA: Diagnosis present

## 2022-11-20 DIAGNOSIS — E039 Hypothyroidism, unspecified: Secondary | ICD-10-CM | POA: Diagnosis present

## 2022-11-20 DIAGNOSIS — R918 Other nonspecific abnormal finding of lung field: Secondary | ICD-10-CM | POA: Diagnosis not present

## 2022-11-20 DIAGNOSIS — Z89429 Acquired absence of other toe(s), unspecified side: Secondary | ICD-10-CM

## 2022-11-20 DIAGNOSIS — R5381 Other malaise: Secondary | ICD-10-CM | POA: Diagnosis present

## 2022-11-20 DIAGNOSIS — R778 Other specified abnormalities of plasma proteins: Secondary | ICD-10-CM | POA: Diagnosis not present

## 2022-11-20 DIAGNOSIS — Z7189 Other specified counseling: Secondary | ICD-10-CM | POA: Diagnosis not present

## 2022-11-20 DIAGNOSIS — I10 Essential (primary) hypertension: Secondary | ICD-10-CM | POA: Diagnosis not present

## 2022-11-20 DIAGNOSIS — E1151 Type 2 diabetes mellitus with diabetic peripheral angiopathy without gangrene: Secondary | ICD-10-CM | POA: Diagnosis present

## 2022-11-20 DIAGNOSIS — J8489 Other specified interstitial pulmonary diseases: Secondary | ICD-10-CM | POA: Diagnosis present

## 2022-11-20 DIAGNOSIS — E038 Other specified hypothyroidism: Secondary | ICD-10-CM | POA: Diagnosis not present

## 2022-11-20 DIAGNOSIS — I447 Left bundle-branch block, unspecified: Secondary | ICD-10-CM | POA: Diagnosis present

## 2022-11-20 DIAGNOSIS — I5023 Acute on chronic systolic (congestive) heart failure: Secondary | ICD-10-CM | POA: Diagnosis not present

## 2022-11-20 DIAGNOSIS — Z955 Presence of coronary angioplasty implant and graft: Secondary | ICD-10-CM

## 2022-11-20 DIAGNOSIS — N189 Chronic kidney disease, unspecified: Secondary | ICD-10-CM | POA: Diagnosis not present

## 2022-11-20 DIAGNOSIS — I130 Hypertensive heart and chronic kidney disease with heart failure and stage 1 through stage 4 chronic kidney disease, or unspecified chronic kidney disease: Secondary | ICD-10-CM | POA: Diagnosis not present

## 2022-11-20 DIAGNOSIS — I959 Hypotension, unspecified: Secondary | ICD-10-CM | POA: Diagnosis not present

## 2022-11-20 DIAGNOSIS — I252 Old myocardial infarction: Secondary | ICD-10-CM

## 2022-11-20 DIAGNOSIS — I2510 Atherosclerotic heart disease of native coronary artery without angina pectoris: Secondary | ICD-10-CM | POA: Diagnosis present

## 2022-11-20 DIAGNOSIS — Z9049 Acquired absence of other specified parts of digestive tract: Secondary | ICD-10-CM

## 2022-11-20 DIAGNOSIS — I5021 Acute systolic (congestive) heart failure: Secondary | ICD-10-CM | POA: Diagnosis not present

## 2022-11-20 LAB — CBC WITH DIFFERENTIAL/PLATELET
Abs Immature Granulocytes: 0.04 10*3/uL (ref 0.00–0.07)
Basophils Absolute: 0 10*3/uL (ref 0.0–0.1)
Basophils Relative: 1 %
Eosinophils Absolute: 0.1 10*3/uL (ref 0.0–0.5)
Eosinophils Relative: 1 %
HCT: 36.3 % — ABNORMAL LOW (ref 39.0–52.0)
Hemoglobin: 11.8 g/dL — ABNORMAL LOW (ref 13.0–17.0)
Immature Granulocytes: 1 %
Lymphocytes Relative: 4 %
Lymphs Abs: 0.3 10*3/uL — ABNORMAL LOW (ref 0.7–4.0)
MCH: 35.6 pg — ABNORMAL HIGH (ref 26.0–34.0)
MCHC: 32.5 g/dL (ref 30.0–36.0)
MCV: 109.7 fL — ABNORMAL HIGH (ref 80.0–100.0)
Monocytes Absolute: 0.5 10*3/uL (ref 0.1–1.0)
Monocytes Relative: 9 %
Neutro Abs: 5.4 10*3/uL (ref 1.7–7.7)
Neutrophils Relative %: 84 %
Platelets: 160 10*3/uL (ref 150–400)
RBC: 3.31 MIL/uL — ABNORMAL LOW (ref 4.22–5.81)
RDW: 16.4 % — ABNORMAL HIGH (ref 11.5–15.5)
WBC: 6.4 10*3/uL (ref 4.0–10.5)
nRBC: 0 % (ref 0.0–0.2)

## 2022-11-20 LAB — COMPREHENSIVE METABOLIC PANEL
ALT: 23 U/L (ref 0–44)
AST: 22 U/L (ref 15–41)
Albumin: 4.1 g/dL (ref 3.5–5.0)
Alkaline Phosphatase: 49 U/L (ref 38–126)
Anion gap: 14 (ref 5–15)
BUN: 74 mg/dL — ABNORMAL HIGH (ref 8–23)
CO2: 26 mmol/L (ref 22–32)
Calcium: 9.2 mg/dL (ref 8.9–10.3)
Chloride: 97 mmol/L — ABNORMAL LOW (ref 98–111)
Creatinine, Ser: 4.2 mg/dL — ABNORMAL HIGH (ref 0.61–1.24)
GFR, Estimated: 13 mL/min — ABNORMAL LOW (ref >60)
Glucose, Bld: 141 mg/dL — ABNORMAL HIGH (ref 70–99)
Potassium: 4.7 mmol/L (ref 3.5–5.1)
Sodium: 137 mmol/L (ref 135–145)
Total Bilirubin: 0.8 mg/dL (ref 0.3–1.2)
Total Protein: 7.1 g/dL (ref 6.5–8.1)

## 2022-11-20 LAB — RESP PANEL BY RT-PCR (RSV, FLU A&B, COVID)  RVPGX2
Influenza A by PCR: NEGATIVE
Influenza B by PCR: NEGATIVE
Resp Syncytial Virus by PCR: NEGATIVE
SARS Coronavirus 2 by RT PCR: NEGATIVE

## 2022-11-20 LAB — TROPONIN I (HIGH SENSITIVITY)
Troponin I (High Sensitivity): 774 ng/L (ref <18)
Troponin I (High Sensitivity): 680 ng/L (ref <18)

## 2022-11-20 LAB — BRAIN NATRIURETIC PEPTIDE: B Natriuretic Peptide: 1144.7 pg/mL — ABNORMAL HIGH (ref 0.0–100.0)

## 2022-11-20 MED ORDER — FUROSEMIDE 10 MG/ML IJ SOLN: 40.0000 mg | Freq: Once | INTRAMUSCULAR | Status: DC

## 2022-11-20 MED ORDER — FUROSEMIDE 10 MG/ML IJ SOLN
80.0000 mg | Freq: Once | INTRAMUSCULAR | Status: AC
  Administered 2022-11-20: 80 mg via INTRAVENOUS
  Filled 2022-11-20: qty 8

## 2022-11-20 NOTE — ED Provider Notes (Signed)
[place] EMERGENCY DEPARTMENT AT [facility] Provider Note   CSN: [id number] Arrival date & time: 11/20/22  1931     History  Chief Complaint  Patient presents with   Shortness of Breath    Luke Mcbride is a 87 y.o. male.  Patient with h/o CAD, ischemic cardiomyopathy, BOOP, pauci-immune vasculitis with CKD stage 3b, and diffuse large B cell lymphoma CABG in 2002, cardiac stents --presents to the emergency department today for evaluation of shortness of breath and cough started about 2 days ago.  Patient has had increasing shortness of breath especially worse with activities.  Although, he admits to not being very physically active.  He has lower extremity swelling that is at baseline and is not particularly worse.  No fevers.  He has had some chest pressure which occurs randomly and is resolved with taking a few deep breaths.  He has been taking his diuretics as prescribed but does not feel that he has been urinating quite as much.  No abdominal pains.      Home Medications Prior to Admission medications   Medication Sig Start Date End Date Taking? Authorizing Provider  acetaminophen (TYLENOL) 500 MG tablet Take 500 mg by mouth daily as needed for moderate pain or headache.     [provider]  amiodarone (PACERONE) 200 MG tablet Take 1 tablet (200 mg total) by mouth daily. 12/31/21   Laurey Morale, MD  azaTHIOprine (IMURAN) 50 MG tablet Take 100 mg by mouth daily.    [provider]  clopidogrel (PLAVIX) 75 MG tablet TAKE 1 TABLET BY MOUTH  DAILY 12/24/21   Laurey Morale, MD  dorzolamide-timolol (COSOPT) 22.3-6.8 MG/ML ophthalmic solution Place 1 drop into both eyes 2 (two) times daily.     [provider]  empagliflozin (JARDIANCE) 10 MG TABS tablet Take 1 tablet (10 mg total) by mouth daily before breakfast. 02/04/22   Laurey Morale, MD  finasteride (PROSCAR) 5 MG tablet Take 5 mg by mouth at bedtime.     [provider]   levothyroxine (SYNTHROID, LEVOTHROID) 100 MCG tablet Take 100 mcg by mouth daily before breakfast.    [provider]  metoprolol succinate (TOPROL-XL) 50 MG 24 hr tablet Take 1.5 tablets (75 mg total) by mouth daily. 02/10/22   Laurey Morale, MD  Multiple Vitamin (MULTIVITAMIN) tablet Take 1 tablet by mouth daily.    [provider]  nitroGLYCERIN (NITROSTAT) 0.4 MG SL tablet Place 1 tablet (0.4 mg total) under the tongue every 5 (five) minutes as needed for chest pain. 11/24/21   Jacklynn Ganong, FNP  pantoprazole (PROTONIX) 40 MG tablet TAKE 1 TABLET BY MOUTH  DAILY 11/30/21   Wendall Stade, MD  rosuvastatin (CRESTOR) 10 MG tablet Take 10 mg every morning by mouth. 03/22/17   [provider]  tamsulosin (FLOMAX) 0.4 MG CAPS capsule Take 0.4 mg by mouth at bedtime.    [provider]  torsemide (DEMADEX) 20 MG tablet 60 mg daily, alternating with 40 mg daily. 07/16/22   Laurey Morale, MD      Allergies    Patient has no known allergies.    Review of Systems   Review of Systems  Physical Exam Updated Vital Signs BP 109/81   Pulse 70   Temp 97.8 F (36.6 C)   Resp 20   Ht 6' (1.829 m)   Wt 90.7 kg   SpO2 95%   BMI 27.12 kg/m   Physical  Exam Vitals and nursing note reviewed.  Constitutional:      Appearance: He is well-developed. He is not diaphoretic.  HENT:     Head: Normocephalic and atraumatic.     Mouth/Throat:     Mouth: Mucous membranes are not dry.  Eyes:     Conjunctiva/sclera: Conjunctivae normal.  Neck:     Vascular: Normal carotid pulses. No carotid bruit or JVD.     Trachea: Trachea normal. No tracheal deviation.  Cardiovascular:     Rate and Rhythm: Normal rate and regular rhythm.     Pulses: No decreased pulses.          Radial pulses are 2+ on the right side and 2+ on the left side.     Heart sounds: Normal heart sounds, S1 normal and S2 normal. Heart sounds not distant. No murmur heard. Pulmonary:     Effort:  Pulmonary effort is normal. No respiratory distress.     Breath sounds: Normal breath sounds. No wheezing, rhonchi or rales.  Chest:     Chest wall: No tenderness.  Abdominal:     General: Bowel sounds are normal.     Palpations: Abdomen is soft.     Tenderness: There is no abdominal tenderness. There is no guarding or rebound.  Musculoskeletal:     Cervical back: Normal range of motion and neck supple. No muscular tenderness.     Right lower leg: Edema present.     Left lower leg: Edema present.  Skin:    General: Skin is warm and dry.     Coloration: Skin is not pale.  Neurological:     Mental Status: He is alert. Mental status is at baseline.  Psychiatric:        Mood and Affect: Mood normal.    ED Results / Procedures / Treatments   Labs (all labs ordered are listed, but only abnormal results are displayed) Labs Reviewed  CBC WITH DIFFERENTIAL/PLATELET - Abnormal; Notable for the following components:      Result Value   RBC 3.31 (*)    Hemoglobin 11.8 (*)    HCT 36.3 (*)    MCV 109.7 (*)    MCH 35.6 (*)    RDW 16.4 (*)    Lymphs Abs 0.3 (*)    All other components within normal limits  COMPREHENSIVE METABOLIC PANEL - Abnormal; Notable for the following components:   Chloride 97 (*)    Glucose, Bld 141 (*)    BUN 74 (*)    Creatinine, Ser 4.20 (*)    GFR, Estimated 13 (*)    All other components within normal limits  BRAIN NATRIURETIC PEPTIDE - Abnormal; Notable for the following components:   B Natriuretic Peptide 1,144.7 (*)    All other components within normal limits  TROPONIN I (HIGH SENSITIVITY) - Abnormal; Notable for the following components:   Troponin I (High Sensitivity) 774 (*)    All other components within normal limits  RESP PANEL BY RT-PCR (RSV, FLU A&B, COVID)  [id number]    EKG EKG Interpretation Date/Time:  Saturday November 20 2022 19:44:17 EDT Ventricular Rate:  76 PR Interval:  332 QRS Duration:  142 QT Interval:  244 QTC Calculation: 274 R  Axis:   31  Text Interpretation: Sinus rhythm with 1st degree A-V block Left bundle branch block Abnormal ECG When compared with ECG of 16-Jul-2022 11:26, Sinus rhythm has replaced Junctional rhythm Left bundle branch block is now Present Criteria for Inferior infarct are no longer  Present Confirmed by Virgina Norfolk [phone]) on 11/20/2022 7:57:29 PM  Radiology DG Chest Portable 1 View  Result Date: 11/20/2022 CLINICAL DATA:  Cardiomegaly. EXAM: PORTABLE CHEST 1 VIEW COMPARISON:  05/25/2022 FINDINGS: Cardiomegaly status post median sternotomy and CABG. Mild diffuse interstitial opacity. Small, layering pleural effusions. The visualized skeletal structures are unremarkable. IMPRESSION: Cardiomegaly with mild diffuse interstitial opacity and small, layering pleural effusions, consistent with edema. No focal airspace opacity. Electronically Signed   By: Jearld Lesch M.D.   On: 11/20/2022 20:50    Procedures Procedures    Medications Ordered in ED Medications  furosemide (LASIX) injection 80 mg (80 mg Intravenous Given 11/20/22 2132)    ED Course/ Medical Decision Making/ A&P    Patient seen and examined. History obtained directly from patient and family member at bedside.  I reviewed patient's recent outpatient cardiology notes.   Labs/EKG: Lab workup was ordered in triage personally reviewed and interpreted including: CBC with differential with normal white blood cell count, hemoglobin slightly low at 11.8, macrocytic; CMP creatinine above baseline at 4.2 with a BUN of 74, glucose 141; BNP markedly elevated at 1144; troponin elevated at 774.  Respiratory panel negative.  EKG with apparent new left bundle branch block.  Imaging: Personally reviewed and interpreted, agree chest x-ray with small layering pleural effusions  Medications/Fluids: Ordered: Lasix 40 mg.   Most recent vital signs reviewed and are as follows: BP 109/81   Pulse 70   Temp 97.8 F (36.6 C)   Resp 20   Ht 6' (1.829 m)    Wt 90.7 kg   SpO2 95%   BMI 27.12 kg/m   Initial impression: Likely congestive heart failure exacerbation, EKG with new left bundle branch block, elevated troponin clinical history not overly suggestive of acute ischemia, however cannot entirely rule out.  This may be related to underlying CKD and heart failure exacerbation.  Patient will need admission.  Will discuss with cardiology.  9:39 PM  I spoke with on-call cardiology Dr. Jayme Cloud with cardiology. Reccs 80mg  IV lasix, no anticoagulation at this time given the elevated troponin is most likely related to CHF/CKD.  I have consulted with Dr. Julian Reil of [facility] hospitalist to accept patient for admission.  Currently awaiting second troponin.  Plan: Admission to the hospital.  11:24 PM 2nd troponin slightly improved, 680.   CRITICAL CARE Performed by: Renne Crigler PA-C Total critical care time: 33 minutes Critical care time was exclusive of separately billable procedures and treating other patients. Critical care was necessary to treat or prevent imminent or life-threatening deterioration. Critical care was time spent personally by me on the following activities: development of treatment plan with patient and/or surrogate as well as nursing, discussions with consultants, evaluation of patient's response to treatment, examination of patient, obtaining history from patient or surrogate, ordering and performing treatments and interventions, ordering and review of laboratory studies, ordering and review of radiographic studies, pulse oximetry and re-evaluation of patient's condition.                             Medical Decision Making Amount and/or Complexity of Data Reviewed Labs: ordered. Radiology: ordered.  Risk Prescription drug management. Decision regarding hospitalization.           Final Clinical Impression(s) / ED Diagnoses Final diagnoses:  Acute on chronic congestive heart failure, unspecified heart failure  type (HCC)  Elevated troponin  Acute renal failure superimposed on chronic kidney disease, unspecified acute  renal failure type, unspecified CKD stage [facility])    Rx / DC Orders ED Discharge Orders     None         Renne Crigler, PA-C 11/20/22 2324    Virgina Norfolk, DO 11/21/22 1549

## 2022-11-20 NOTE — ED Triage Notes (Signed)
POV from home, A&O x 4, GCS 15, BIB wheelchair  C/o sob and chest pain that started 2 days ago, productive cough developed as well, pt sts worse with laying flat and chest pain is mostly when trying to take deep breath.

## 2022-11-20 NOTE — ED Notes (Signed)
Josh, PA aware of troponin 680.

## 2022-11-20 NOTE — ED Notes (Signed)
Josh,PA aware of elevated troponin of 774.

## 2022-11-21 DIAGNOSIS — E785 Hyperlipidemia, unspecified: Secondary | ICD-10-CM

## 2022-11-21 DIAGNOSIS — I130 Hypertensive heart and chronic kidney disease with heart failure and stage 1 through stage 4 chronic kidney disease, or unspecified chronic kidney disease: Secondary | ICD-10-CM | POA: Diagnosis present

## 2022-11-21 DIAGNOSIS — D649 Anemia, unspecified: Secondary | ICD-10-CM | POA: Diagnosis not present

## 2022-11-21 DIAGNOSIS — N184 Chronic kidney disease, stage 4 (severe): Secondary | ICD-10-CM

## 2022-11-21 DIAGNOSIS — Z7401 Bed confinement status: Secondary | ICD-10-CM | POA: Diagnosis not present

## 2022-11-21 DIAGNOSIS — Z7189 Other specified counseling: Secondary | ICD-10-CM | POA: Diagnosis not present

## 2022-11-21 DIAGNOSIS — J8489 Other specified interstitial pulmonary diseases: Secondary | ICD-10-CM | POA: Diagnosis present

## 2022-11-21 DIAGNOSIS — E1169 Type 2 diabetes mellitus with other specified complication: Secondary | ICD-10-CM

## 2022-11-21 DIAGNOSIS — N400 Enlarged prostate without lower urinary tract symptoms: Secondary | ICD-10-CM | POA: Diagnosis present

## 2022-11-21 DIAGNOSIS — I255 Ischemic cardiomyopathy: Secondary | ICD-10-CM | POA: Diagnosis present

## 2022-11-21 DIAGNOSIS — I776 Arteritis, unspecified: Secondary | ICD-10-CM

## 2022-11-21 DIAGNOSIS — I10 Essential (primary) hypertension: Secondary | ICD-10-CM

## 2022-11-21 DIAGNOSIS — I5021 Acute systolic (congestive) heart failure: Secondary | ICD-10-CM | POA: Diagnosis not present

## 2022-11-21 DIAGNOSIS — I25118 Atherosclerotic heart disease of native coronary artery with other forms of angina pectoris: Secondary | ICD-10-CM | POA: Diagnosis not present

## 2022-11-21 DIAGNOSIS — Z9221 Personal history of antineoplastic chemotherapy: Secondary | ICD-10-CM | POA: Diagnosis not present

## 2022-11-21 DIAGNOSIS — N183 Chronic kidney disease, stage 3 unspecified: Secondary | ICD-10-CM | POA: Diagnosis not present

## 2022-11-21 DIAGNOSIS — I214 Non-ST elevation (NSTEMI) myocardial infarction: Secondary | ICD-10-CM | POA: Diagnosis not present

## 2022-11-21 DIAGNOSIS — I959 Hypotension, unspecified: Secondary | ICD-10-CM | POA: Diagnosis not present

## 2022-11-21 DIAGNOSIS — I5023 Acute on chronic systolic (congestive) heart failure: Secondary | ICD-10-CM

## 2022-11-21 DIAGNOSIS — E1142 Type 2 diabetes mellitus with diabetic polyneuropathy: Secondary | ICD-10-CM | POA: Diagnosis present

## 2022-11-21 DIAGNOSIS — E1122 Type 2 diabetes mellitus with diabetic chronic kidney disease: Secondary | ICD-10-CM | POA: Diagnosis present

## 2022-11-21 DIAGNOSIS — R531 Weakness: Secondary | ICD-10-CM | POA: Diagnosis not present

## 2022-11-21 DIAGNOSIS — I472 Ventricular tachycardia, unspecified: Secondary | ICD-10-CM | POA: Diagnosis present

## 2022-11-21 DIAGNOSIS — I5043 Acute on chronic combined systolic (congestive) and diastolic (congestive) heart failure: Secondary | ICD-10-CM | POA: Diagnosis not present

## 2022-11-21 DIAGNOSIS — E039 Hypothyroidism, unspecified: Secondary | ICD-10-CM | POA: Diagnosis present

## 2022-11-21 DIAGNOSIS — J849 Interstitial pulmonary disease, unspecified: Secondary | ICD-10-CM

## 2022-11-21 DIAGNOSIS — W19XXXA Unspecified fall, initial encounter: Secondary | ICD-10-CM | POA: Diagnosis not present

## 2022-11-21 DIAGNOSIS — N179 Acute kidney failure, unspecified: Secondary | ICD-10-CM | POA: Diagnosis present

## 2022-11-21 DIAGNOSIS — R0602 Shortness of breath: Secondary | ICD-10-CM | POA: Diagnosis not present

## 2022-11-21 DIAGNOSIS — Z79890 Hormone replacement therapy (postmenopausal): Secondary | ICD-10-CM | POA: Diagnosis not present

## 2022-11-21 DIAGNOSIS — I509 Heart failure, unspecified: Secondary | ICD-10-CM | POA: Diagnosis present

## 2022-11-21 DIAGNOSIS — I5084 End stage heart failure: Secondary | ICD-10-CM | POA: Diagnosis present

## 2022-11-21 DIAGNOSIS — Z79899 Other long term (current) drug therapy: Secondary | ICD-10-CM | POA: Diagnosis not present

## 2022-11-21 DIAGNOSIS — E038 Other specified hypothyroidism: Secondary | ICD-10-CM | POA: Diagnosis not present

## 2022-11-21 DIAGNOSIS — Z515 Encounter for palliative care: Secondary | ICD-10-CM | POA: Diagnosis not present

## 2022-11-21 DIAGNOSIS — E1151 Type 2 diabetes mellitus with diabetic peripheral angiopathy without gangrene: Secondary | ICD-10-CM | POA: Diagnosis present

## 2022-11-21 DIAGNOSIS — M25511 Pain in right shoulder: Secondary | ICD-10-CM | POA: Diagnosis not present

## 2022-11-21 DIAGNOSIS — F0390 Unspecified dementia without behavioral disturbance: Secondary | ICD-10-CM | POA: Diagnosis present

## 2022-11-21 DIAGNOSIS — S4291XA Fracture of right shoulder girdle, part unspecified, initial encounter for closed fracture: Secondary | ICD-10-CM | POA: Diagnosis not present

## 2022-11-21 DIAGNOSIS — Z66 Do not resuscitate: Secondary | ICD-10-CM | POA: Diagnosis not present

## 2022-11-21 DIAGNOSIS — I2489 Other forms of acute ischemic heart disease: Secondary | ICD-10-CM | POA: Diagnosis present

## 2022-11-21 DIAGNOSIS — Z1152 Encounter for screening for COVID-19: Secondary | ICD-10-CM | POA: Diagnosis not present

## 2022-11-21 LAB — SODIUM, URINE, RANDOM: Sodium, Ur: 28 mmol/L

## 2022-11-21 LAB — GLUCOSE, CAPILLARY
Glucose-Capillary: 107 mg/dL — ABNORMAL HIGH (ref 70–99)
Glucose-Capillary: 129 mg/dL — ABNORMAL HIGH (ref 70–99)
Glucose-Capillary: 113 mg/dL — ABNORMAL HIGH (ref 70–99)

## 2022-11-21 LAB — LACTIC ACID, PLASMA: Lactic Acid, Venous: 1.9 mmol/L (ref 0.5–1.9)

## 2022-11-21 LAB — URINALYSIS, ROUTINE W REFLEX MICROSCOPIC
Bilirubin Urine: NEGATIVE
Glucose, UA: 150 mg/dL — AB
Hgb urine dipstick: NEGATIVE
Ketones, ur: NEGATIVE mg/dL
Leukocytes,Ua: NEGATIVE
Nitrite: NEGATIVE
Protein, ur: NEGATIVE mg/dL
Specific Gravity, Urine: 1.009 (ref 1.005–1.030)
pH: 6 (ref 5.0–8.0)

## 2022-11-21 LAB — CREATININE, URINE, RANDOM: Creatinine, Urine: 53 mg/dL

## 2022-11-21 LAB — MRSA NEXT GEN BY PCR, NASAL: MRSA by PCR Next Gen: NOT DETECTED

## 2022-11-21 MED ORDER — ROSUVASTATIN CALCIUM 5 MG PO TABS
10.0000 mg | ORAL_TABLET | Freq: Every day | ORAL | Status: DC
  Administered 2022-11-21 – 2022-11-23 (×3): 10 mg via ORAL
  Filled 2022-11-21 (×3): qty 2

## 2022-11-21 MED ORDER — ENOXAPARIN SODIUM 30 MG/0.3ML IJ SOSY
30.0000 mg | PREFILLED_SYRINGE | INTRAMUSCULAR | Status: DC
  Administered 2022-11-21 – 2022-11-23 (×3): 30 mg via SUBCUTANEOUS
  Filled 2022-11-21 (×3): qty 0.3

## 2022-11-21 MED ORDER — ENOXAPARIN SODIUM 40 MG/0.4ML IJ SOSY: 40.0000 mg | PREFILLED_SYRINGE | INTRAMUSCULAR | Status: DC

## 2022-11-21 MED ORDER — DORZOLAMIDE HCL-TIMOLOL MAL 2-0.5 % OP SOLN
1.0000 [drp] | Freq: Two times a day (BID) | OPHTHALMIC | Status: DC
  Administered 2022-11-21 – 2022-11-23 (×5): 1 [drp] via OPHTHALMIC
  Filled 2022-11-21: qty 10

## 2022-11-21 MED ORDER — ONDANSETRON HCL 4 MG PO TABS: 4.0000 mg | ORAL_TABLET | Freq: Four times a day (QID) | ORAL | Status: DC | PRN

## 2022-11-21 MED ORDER — CLOPIDOGREL BISULFATE 75 MG PO TABS
75.0000 mg | ORAL_TABLET | Freq: Every day | ORAL | Status: DC
  Administered 2022-11-21 – 2022-11-23 (×3): 75 mg via ORAL
  Filled 2022-11-21 (×3): qty 1

## 2022-11-21 MED ORDER — FINASTERIDE 5 MG PO TABS
5.0000 mg | ORAL_TABLET | Freq: Every day | ORAL | Status: DC
  Administered 2022-11-21 – 2022-11-22 (×2): 5 mg via ORAL
  Filled 2022-11-21 (×2): qty 1

## 2022-11-21 MED ORDER — PANTOPRAZOLE SODIUM 40 MG PO TBEC
40.0000 mg | DELAYED_RELEASE_TABLET | Freq: Every day | ORAL | Status: DC
  Administered 2022-11-21 – 2022-11-23 (×3): 40 mg via ORAL
  Filled 2022-11-21 (×3): qty 1

## 2022-11-21 MED ORDER — MIDODRINE HCL 5 MG PO TABS
5.0000 mg | ORAL_TABLET | Freq: Three times a day (TID) | ORAL | Status: DC
  Administered 2022-11-21 – 2022-11-22 (×3): 5 mg via ORAL
  Filled 2022-11-21 (×3): qty 1

## 2022-11-21 MED ORDER — METOPROLOL SUCCINATE ER 50 MG PO TB24: 75.0000 mg | ORAL_TABLET | Freq: Every day | ORAL | Status: DC

## 2022-11-21 MED ORDER — LEVOTHYROXINE SODIUM 100 MCG PO TABS
100.0000 ug | ORAL_TABLET | Freq: Every day | ORAL | Status: DC
  Administered 2022-11-21 – 2022-11-23 (×3): 100 ug via ORAL
  Filled 2022-11-21 (×3): qty 1

## 2022-11-21 MED ORDER — FUROSEMIDE 10 MG/ML IJ SOLN
80.0000 mg | Freq: Two times a day (BID) | INTRAMUSCULAR | Status: DC
  Administered 2022-11-21 – 2022-11-22 (×3): 80 mg via INTRAVENOUS
  Filled 2022-11-21 (×3): qty 8

## 2022-11-21 MED ORDER — AZATHIOPRINE 50 MG PO TABS
100.0000 mg | ORAL_TABLET | Freq: Every day | ORAL | Status: DC
  Administered 2022-11-21 – 2022-11-23 (×3): 100 mg via ORAL
  Filled 2022-11-21 (×3): qty 2

## 2022-11-21 MED ORDER — NITROGLYCERIN 0.4 MG SL SUBL: 0.4000 mg | SUBLINGUAL_TABLET | SUBLINGUAL | Status: DC | PRN

## 2022-11-21 MED ORDER — AMIODARONE HCL 200 MG PO TABS
200.0000 mg | ORAL_TABLET | Freq: Every day | ORAL | Status: DC
  Administered 2022-11-21 – 2022-11-23 (×3): 200 mg via ORAL
  Filled 2022-11-21 (×3): qty 1

## 2022-11-21 MED ORDER — ADULT MULTIVITAMIN W/MINERALS CH
1.0000 | ORAL_TABLET | Freq: Every day | ORAL | Status: DC
  Administered 2022-11-21 – 2022-11-23 (×3): 1 via ORAL
  Filled 2022-11-21 (×3): qty 1

## 2022-11-21 MED ORDER — ACETAMINOPHEN 650 MG RE SUPP: 650.0000 mg | Freq: Four times a day (QID) | RECTAL | Status: DC | PRN

## 2022-11-21 MED ORDER — EMPAGLIFLOZIN 10 MG PO TABS: 10.0000 mg | ORAL_TABLET | Freq: Every day | ORAL | Status: DC

## 2022-11-21 MED ORDER — INSULIN ASPART 100 UNIT/ML IJ SOLN
0.0000 [IU] | Freq: Three times a day (TID) | INTRAMUSCULAR | Status: DC
  Administered 2022-11-22 – 2022-11-23 (×2): 2 [IU] via SUBCUTANEOUS
  Administered 2022-11-23: 1 [IU] via SUBCUTANEOUS

## 2022-11-21 MED ORDER — ORAL CARE MOUTH RINSE: 15.0000 mL | OROMUCOSAL | Status: DC | PRN

## 2022-11-21 MED ORDER — MIDODRINE HCL 5 MG PO TABS: 5.0000 mg | ORAL_TABLET | Freq: Two times a day (BID) | ORAL | Status: DC

## 2022-11-21 MED ORDER — ACETAMINOPHEN 325 MG PO TABS
650.0000 mg | ORAL_TABLET | Freq: Four times a day (QID) | ORAL | Status: DC | PRN
  Administered 2022-11-23 (×2): 650 mg via ORAL
  Filled 2022-11-21 (×2): qty 2

## 2022-11-21 MED ORDER — METOPROLOL SUCCINATE ER 25 MG PO TB24
25.0000 mg | ORAL_TABLET | Freq: Every day | ORAL | Status: DC
  Administered 2022-11-21: 25 mg via ORAL
  Filled 2022-11-21: qty 1

## 2022-11-21 MED ORDER — ONDANSETRON HCL 4 MG/2ML IJ SOLN
4.0000 mg | Freq: Four times a day (QID) | INTRAMUSCULAR | Status: DC | PRN
  Administered 2022-11-21: 4 mg via INTRAVENOUS
  Filled 2022-11-21: qty 2

## 2022-11-21 MED ORDER — TAMSULOSIN HCL 0.4 MG PO CAPS
0.4000 mg | ORAL_CAPSULE | Freq: Every day | ORAL | Status: DC
  Administered 2022-11-21 – 2022-11-22 (×2): 0.4 mg via ORAL
  Filled 2022-11-21 (×2): qty 1

## 2022-11-21 NOTE — Assessment & Plan Note (Signed)
Reduced GFR with hypervolemia.  Plan to continue diuresis with loop diuretics. Follow up renal function in am, avoid hypotension or nephrotoxic medications.

## 2022-11-21 NOTE — Progress Notes (Signed)
    Advance Care Planning  Reason for Advance Care Planning Conversation: Acute hospitalization for decompensated CHF    I discussed with patient about advance care planning. Specifically, we discussed whether patient would desire cardiopulmonary resuscitation (CPR) in the event of acute cardiopulmonary arrest. We also discussed whether endotracheal intubation and temporary ventilator life support would be desired in the event of acute cardio- or pulmonary decompensation.   We have reviewed that he is re-admitted for acute on chronic systolic heart failure, diuresis is limited by low normal BP requiring midodrine as well as AKI on CKD IV. He was deemed not a candidate for dialysis by nephrology. He was felt not candidate for ICD and questionable benefit from CRT by AHF team.  We discussed the options of temporary measures including inotropic support and central line placement, if he become further hypotensive requiring higher level of care.  We have discussed the fact that he has poor long-term prognosis and these temporary measures may not lead to significant quality of life change.  We have discussed the possibility of prolonged suffering from these extreme supportive measures and would wish to avoid them if he prefers.  Patient has a clear understanding of his current circumstance.  Patient wishes to have more time to think over this topic, as this is the first time he had to think about it.   CODE STATUS remains full code at this time, according to patient's wish.  Informed patient to alert medical team if he changes his mind.   Consider official palliative care consult Monday 11/22/22 with continued goals of care discussion.     Time spent today in ACP discussion was 30 mins  Cyndi Bender

## 2022-11-21 NOTE — ED Notes (Signed)
Daughter called for update of room

## 2022-11-21 NOTE — ED Notes (Signed)
Patient assisted back to bed. Repositioned. Call bell in reach

## 2022-11-21 NOTE — Progress Notes (Signed)
Mobility Specialist Progress Note   11/21/22 1021  Mobility  Activity Ambulated with assistance to bathroom  Level of Assistance Minimal assist, patient does 75% or more  Assistive Device Front wheel walker  Distance Ambulated (ft) 12 ft  Activity Response Tolerated well  Mobility Referral Yes  $Mobility charge 1 Mobility  Mobility Specialist Start Time (ACUTE ONLY) 1015  Mobility Specialist Stop Time (ACUTE ONLY) 1022  Mobility Specialist Time Calculation (min) (ACUTE ONLY) 7 min   Pt found in bed requesting assistance to BR. Able to get EOB w/ stand by assist but requiring MinA to stand from an elevated surface d/t general weakness. Transferred to BR w/o fault, notified NT of pt's position.   Frederico Hamman Mobility Specialist Please contact via SecureChat or  Rehab office at [phone]

## 2022-11-21 NOTE — Assessment & Plan Note (Signed)
Continue with azathioprine.  No signs of acute exacerbation.

## 2022-11-21 NOTE — Assessment & Plan Note (Signed)
Add insulin sliding scale for glucose cover and monitoring.  Continue with statin therapy.

## 2022-11-21 NOTE — Assessment & Plan Note (Signed)
No signs of acute coronary syndrome.  Plan to continue clopidogrel and statin.

## 2022-11-21 NOTE — Consult Note (Signed)
Reason for Consult: AKI/CKD stage IV Referring Physician: Shirlee Latch, MD  Luke Mcbride is an 87 y.o. male with an extensive past medical history significant for pauci immune vasculitis (pulmonary-renal syndrome diagnosed in 2008 treated with cytoxan then had relapse with pulmonary hemorrhage treated with plasmapheresis, and maintained on imuran), CAD, ischemic cardiomyopathy (EF 35%, decreased RV systolic function), BOOP, diffuse large B-cell lymphoma in 2018 (s/p colon resection and chemotherapy), and CKD stage IV who presented to [facility] ED on 11/20/22 with a 2 day history of increasing SOB, DOE, orthopnea, and chest pressure.  In the ED, Temp 97.8, Bp 109/81, HR 70, SpO2 95%.  Labs notable for Hgb 11.8, BUN 74, Cr 4.2, BNP 1145, troponin I 774.  ECG with new LBBB.  CXR with diffuse interstitial edema and layering pleural effusions.  He is being admitted for acute on chronic systolic CHF.  We were consulted due to the development of AKI/CKD stage IV.  The trend in Scr is seen below.  He denies any N/V/D, dysgeusia, anorexia but does have malaise and fatigue.  He is well known to me since his initial diagnosis of pauci-immune vasculitis.  He has had progressive CKD to stage IV since developing cardiorenal syndrome.  We have discussed RRT and are both in agreement not to pursue any form of RRT given his advanced age, multiple co-morbidities, and his poor functional and nutritional status.  Nor would he tolerate it well given his declining cardiac function.   Trend in Creatinine:  Creatinine, Ser  Date/Time Value Ref Range Status  11/20/2022 07:41 PM 4.20 (H) 0.61 - 1.24 mg/dL Final  16/02/9603 54:09 AM 3.34 (H)    07/26/2022 12:05 PM 3.30 (H) 0.61 - 1.24 mg/dL Final  81/19/1478 29:56 AM 3.24 (H) 0.61 - 1.24 mg/dL Final  21/30/8657 84:69 PM 3.47 (H) 0.61 - 1.24 mg/dL Final  62/95/2841 32:44 AM 3.47 (H) 0.61 - 1.24 mg/dL Final  05/19/7251 66:44 AM 3.04 (H) 0.61 - 1.24 mg/dL Final  03/47/4259 56:38 AM 3.08  (HH) 0.61 - 1.24 mg/dL Final  75/64/3329 51:88 AM 3.11 (H) 0.61 - 1.24 mg/dL Final  41/66/0630 16:01 AM 2.98 (H) 0.61 - 1.24 mg/dL Final  09/32/3557 32:20 AM 2.88 (H) 0.61 - 1.24 mg/dL Final  25/42/7062 37:62 AM 2.84 (H) 0.61 - 1.24 mg/dL Final  83/15/1761 60:73 AM 2.65 (H) 0.61 - 1.24 mg/dL Final  71/10/2692 85:46 AM 2.67 (H) 0.61 - 1.24 mg/dL Final  27/07/5007 38:18 PM 2.60 (H) 0.61 - 1.24 mg/dL Final  29/93/7169 67:89 PM 2.89 (H) 0.61 - 1.24 mg/dL Final  38/02/1750 02:58 PM 2.80 (H) 0.61 - 1.24 mg/dL Final  52/77/8242 35:36 AM 2.69 (H) 0.61 - 1.24 mg/dL Final  14/43/1540 08:67 AM 2.35 (H) 0.76 - 1.27 mg/dL Final  61/95/0932 67:12 AM 2.09 (H) 0.76 - 1.27 mg/dL Final  45/80/9983 38:25 AM 2.27 (H) 0.76 - 1.27 mg/dL Final  05/39/7673 41:93 AM 2.38 (H) 0.61 - 1.24 mg/dL Final  79/06/4095 35:32 AM 2.21 (H) 0.61 - 1.24 mg/dL Final  99/24/2683 41:96 AM 2.24 (H) 0.70 - 1.30 mg/dL Final  22/29/7989 21:19 AM 1.86 (H) 0.61 - 1.24 mg/dL Final  41/74/0814 48:18 AM 1.78 (H) 0.61 - 1.24 mg/dL Final  56/31/4970 26:37 PM 1.82 (H) 0.61 - 1.24 mg/dL Final  85/88/5027 74:12 AM 1.75 (H) 0.76 - 1.27 mg/dL Final  87/86/7672 09:47 AM 2.11 (H) 0.61 - 1.24 mg/dL Final  09/62/8366 29:47 AM 1.95 (H) 0.76 - 1.27 mg/dL Final  65/46/5035 46:56 AM 2.00 (H) 0.76 -  1.27 mg/dL Final  78/29/5621 30:86 AM 2.16 (H) 0.76 - 1.27 mg/dL Final  57/84/6962 95:28 AM 1.88 (H) 0.61 - 1.24 mg/dL Final  41/32/4401 02:72 AM 1.76 (H) 0.61 - 1.24 mg/dL Final  53/66/4403 47:42 AM 1.82 (H) 0.61 - 1.24 mg/dL Final  59/56/3875 64:33 AM 1.63 (H) 0.61 - 1.24 mg/dL Final  29/51/8841 66:06 AM 1.64 (H) 0.61 - 1.24 mg/dL Final  30/16/0109 32:35 AM 1.70 (H) 0.61 - 1.24 mg/dL Final  57/32/2025 42:70 AM 1.74 (H) 0.61 - 1.24 mg/dL Final  62/37/6283 15:17 AM 2.00 (H) 0.61 - 1.24 mg/dL Final  61/60/7371 06:26 AM 1.66 (H) 0.61 - 1.24 mg/dL Final  94/85/4627 03:50 AM 1.66 (H) 0.61 - 1.24 mg/dL Final  09/38/1829 93:71 PM 2.07 (H) 0.61 - 1.24 mg/dL  Final  69/67/8938 10:17 AM 2.07 (H) 0.61 - 1.24 mg/dL Final  51/06/5850 77:82 AM 1.92 (H) 0.61 - 1.24 mg/dL Final  42/35/3614 43:15 AM 2.16 (H) 0.61 - 1.24 mg/dL Final  40/12/6759 95:09 AM 1.94 (H) 0.61 - 1.24 mg/dL Final  32/67/1245 80:99 AM 2.03 (H) 0.61 - 1.24 mg/dL Final  83/38/2505 39:76 AM 1.96 (H) 0.61 - 1.24 mg/dL Final  73/41/9379 02:40 PM 1.80 (H) 0.50 - 1.35 mg/dL Final  97/35/3299 24:26 AM 2.0 (H) 0.4 - 1.5 mg/dL Final  83/41/9622 29:79 AM 1.96 (H) 0.50 - 1.35 mg/dL Final  89/21/1941 74:08 AM 1.96 (H) 0.50 - 1.35 mg/dL Final  14/48/1856 31:49 AM 1.72 (H) 0.50 - 1.35 mg/dL Final   Creatinine  Date/Time Value Ref Range Status  03/13/2021 11:54 AM 2.46 (H) 0.61 - 1.24 mg/dL Final  70/26/3785 88:50 AM 2.66 (H) 0.61 - 1.24 mg/dL Final  27/74/1287 86:76 AM 2.38 (H) 0.61 - 1.24 mg/dL Final  72/01/4708 62:83 AM 2.41 (H) 0.61 - 1.24 mg/dL Final  66/29/4765 46:50 AM 2.22 (H) 0.61 - 1.24 mg/dL Final  35/46/5681 27:51 AM 2.09 (H) 0.61 - 1.24 mg/dL Final  70/05/7492 49:67 AM 1.97 (H) 0.61 - 1.24 mg/dL Final  59/16/3846 65:99 AM 2.10 (H) 0.61 - 1.24 mg/dL Final  35/70/1779 39:03 AM 3.18 (HH) 0.61 - 1.24 mg/dL Final  00/92/3300 76:22 AM 2.41 (H) 0.70 - 1.30 mg/dL Final  63/33/5456 25:63 AM 2.1 (H) 0.7 - 1.3 mg/dL Final  89/37/3428 76:81 AM 1.9 (H) 0.7 - 1.3 mg/dL Final  15/72/6203 55:97 AM 1.7 (H) 0.7 - 1.3 mg/dL Final  41/63/8453 64:68 AM 1.7 (H) 0.7 - 1.3 mg/dL Final  08/05/2246 25:00 AM 1.9 (H) 0.7 - 1.3 mg/dL Final  37/08/8887 16:94 AM 1.8 (H) 0.7 - 1.3 mg/dL Final  50/38/8828 00:34 PM 1.8 (H) 0.7 - 1.3 mg/dL Final  91/79/1505 69:79 PM 1.8 (H) 0.7 - 1.3 mg/dL Final  48/05/6551 74:82 AM 1.7 (H) 0.7 - 1.3 mg/dL Final  70/78/6754 49:20 AM 1.9 (H) 0.7 - 1.3 mg/dL Final  02/20/1218 75:88 AM 2.0 (H) 0.7 - 1.3 mg/dL Final  32/54/9826 41:58 PM 2.2 (H) 0.7 - 1.3 mg/dL Final  30/94/0768 08:81 PM 2.1 (H) 0.7 - 1.3 mg/dL Final    PMH:   Past Medical History:  Diagnosis Date   ALLERGIC  RHINITIS    Anemia    hx   Barrett's esophagus    BOOP (bronchiolitis obliterans with organizing pneumonia) (HCC)    a. s/p R VATS 2008.   CAD (coronary artery disease)    a. 05/2000: NSTEMI/CABG x 6: LIMA->LAD, VG->D1, VG->OM1->2, VG->PDA->RPL;  b. 07/2007 MV: high lat infarct, no ischemia, EF 47%;  c. Cath/PCI: LM nl,  LAD20p, 10m, D1 nl, D2 60-70ost, D3 nl, LCX 70ost, 159m, OM1/OM2 min irregs, RCA 30 diff, PDA 99, LIMA->LAD atretic, VG->D1 100, VG->OM1->2 100, VG->PDA->RPL 90p (4.0x23 Vision BMS);  c. 07/2012 Echo: EF 55%, gr1 DD.   Cataract    CHF (congestive heart failure) (HCC)    CKD (chronic kidney disease), stage III (HCC)    a. renal bx 2008: GLN with vasculitis   Diffuse large B cell lymphoma (HCC) 08/07/2016   GERD (gastroesophageal reflux disease)    Glaucoma    a. Cannot see out of L eye.   Hematuria    Microscopic   Hyperglycemia    Patient reported while on prednisone, had to take insulin   Hyperlipemia    Hypertension    Hypothyroidism    ILD (interstitial lung disease) (HCC)    Iritis    Local infection of skin and subcutaneous tissue    Membranoproliferative nephritis    Myocardial infarction (HCC) 2002   Neutropenia, drug-induced (HCC)    Recurrent boils    Residual foreign body in soft tissue    Vasculitis (HCC)    a.  pauciimmune vasculitis with renal involvement and hx of transient hemoptysis in the past with associated BOOP, 2008 (renal bx 2008: GLN with vasculitis). b. History of treatment with 2 cycles of Cytoxan and pheresis. H/o hemoptysis and pulm hemorrhage with 2nd cycle of cytoxan.    PSH:   Past Surgical History:  Procedure Laterality Date   BOWEL RESECTION N/A 07/12/2016   Procedure: SMALL BOWEL RESECTION;  Surgeon: Emelia Loron, MD;  Location: [facility] OR;  Service: General;  Laterality: N/A;   CATARACT EXTRACTION Bilateral    CORONARY ARTERY BYPASS GRAFT  2002   CORONARY STENT INTERVENTION N/A 03/31/2017   Procedure: CORONARY STENT INTERVENTION;   Surgeon: Swaziland, Peter M, MD;  Location: [facility] INVASIVE CV LAB;  Service: Cardiovascular;  Laterality: N/A;   IR FLUORO GUIDE PORT INSERTION RIGHT  08/20/2016   IR REMOVAL TUN ACCESS W/ PORT W/O FL MOD SED  01/20/2017   IR US GUIDE VASC ACCESS RIGHT  08/20/2016   LAPAROSCOPY N/A 07/12/2016   Procedure: LAPAROSCOPY DIAGNOSTIC;  Surgeon: Emelia Loron, MD;  Location: [facility] OR;  Service: General;  Laterality: N/A;   LEFT HEART CATHETERIZATION WITH CORONARY/GRAFT ANGIOGRAM N/A 07/26/2012   Procedure: LEFT HEART CATHETERIZATION WITH Isabel Caprice;  Surgeon: Iran Ouch, MD;  Location: [facility] CATH LAB;  Service: Cardiovascular;  Laterality: N/A;   LUNG BIOPSY     PERCUTANEOUS CORONARY STENT INTERVENTION (PCI-S)  07/26/2012   Procedure: PERCUTANEOUS CORONARY STENT INTERVENTION (PCI-S);  Surgeon: Iran Ouch, MD;  Location: [facility] CATH LAB;  Service: Cardiovascular;;   RENAL BIOPSY     RIGHT/LEFT HEART CATH AND CORONARY/GRAFT ANGIOGRAPHY N/A 03/23/2017   Procedure: RIGHT/LEFT HEART CATH AND CORONARY/GRAFT ANGIOGRAPHY;  Surgeon: Swaziland, Peter M, MD;  Location: [facility] INVASIVE CV LAB;  Service: Cardiovascular;  Laterality: N/A;   TOE AMPUTATION  2009   hammer toe    Allergies: No Known Allergies  Medications:   Prior to Admission medications   Medication Sig Start Date End Date Taking? Authorizing Provider  acetaminophen (TYLENOL) 500 MG tablet Take 500 mg by mouth daily as needed for moderate pain or headache.     [provider]  amiodarone (PACERONE) 200 MG tablet Take 1 tablet (200 mg total) by mouth daily. 12/31/21   Laurey Morale, MD  azaTHIOprine (IMURAN) 50 MG tablet Take 100 mg by mouth daily.    [provider]  clopidogrel (PLAVIX) 75 MG tablet TAKE 1 TABLET BY MOUTH  DAILY 12/24/21   Laurey Morale, MD  dorzolamide-timolol (COSOPT) 22.3-6.8 MG/ML ophthalmic solution Place 1 drop into both eyes 2 (two) times daily.     [provider]  empagliflozin  (JARDIANCE) 10 MG TABS tablet Take 1 tablet (10 mg total) by mouth daily before breakfast. 02/04/22   Laurey Morale, MD  finasteride (PROSCAR) 5 MG tablet Take 5 mg by mouth at bedtime.     [provider]  levothyroxine (SYNTHROID, LEVOTHROID) 100 MCG tablet Take 100 mcg by mouth daily before breakfast.    [provider]  metoprolol succinate (TOPROL-XL) 50 MG 24 hr tablet Take 1.5 tablets (75 mg total) by mouth daily. 02/10/22   Laurey Morale, MD  Multiple Vitamin (MULTIVITAMIN) tablet Take 1 tablet by mouth daily.    [provider]  nitroGLYCERIN (NITROSTAT) 0.4 MG SL tablet Place 1 tablet (0.4 mg total) under the tongue every 5 (five) minutes as needed for chest pain. 11/24/21   Jacklynn Ganong, FNP  pantoprazole (PROTONIX) 40 MG tablet TAKE 1 TABLET BY MOUTH  DAILY 11/30/21   Wendall Stade, MD  rosuvastatin (CRESTOR) 10 MG tablet Take 10 mg every morning by mouth. 03/22/17   [provider]  tamsulosin (FLOMAX) 0.4 MG CAPS capsule Take 0.4 mg by mouth at bedtime.    [provider]  torsemide (DEMADEX) 20 MG tablet 60 mg daily, alternating with 40 mg daily. 07/16/22   Laurey Morale, MD    Inpatient medications:  amiodarone  200 mg Oral Daily   azaTHIOprine  100 mg Oral Daily   clopidogrel  75 mg Oral Daily   dorzolamide-timolol  1 drop Both Eyes BID   [START ON 11/22/2022] empagliflozin  10 mg Oral QAC breakfast   enoxaparin (LOVENOX) injection  30 mg Subcutaneous Q24H   finasteride  5 mg Oral QHS   furosemide  80 mg Intravenous BID   levothyroxine  100 mcg Oral Q0600   metoprolol succinate  25 mg Oral Daily   midodrine  5 mg Oral TID WC   multivitamin with minerals  1 tablet Oral Daily   pantoprazole  40 mg Oral Daily   rosuvastatin  10 mg Oral Daily   tamsulosin  0.4 mg Oral QHS    Discontinued Meds:   Medications Discontinued During This Encounter  Medication Reason   furosemide (LASIX) injection 40 mg    enoxaparin  (LOVENOX) injection 40 mg    metoprolol succinate (TOPROL-XL) 24 hr tablet 75 mg    midodrine (PROAMATINE) tablet 5 mg     Social History:  reports that he has never smoked. He has never used smokeless tobacco. He reports that he does not drink alcohol and does not use drugs.  Family History:   Family History  Problem Relation Age of Onset   Heart disease Mother    Diabetes Mother    Prostate cancer Father    Depression Other    Diabetes Other    Prostate cancer Other    Colon polyps Neg Hx    Colon cancer Neg Hx    Rectal cancer Neg Hx    Stomach cancer Neg Hx    Esophageal cancer Neg Hx     Pertinent items are noted in HPI. Weight change:   Intake/Output Summary (Last 24 hours) at 11/21/2022 1329 Last data filed at 11/21/2022 1034 Gross per 24 hour  Intake 200 ml  Output 575 ml  Net -375 ml   BP 92/66 (BP Location: Left Arm)   Pulse 77   Temp (!) 97.5 F (36.4 C) (Oral)   Resp 17   Ht 6\' 1"  (1.854 m)   Wt 96.4 kg   SpO2 96%   BMI 28.04 kg/m  Vitals:   11/21/22 0500 11/21/22 0641 11/21/22 0720 11/21/22 1232  BP: 99/76 (!) 93/53  92/66  Pulse: 71 77  77  Resp: 18 (!) 30  17  Temp: 98 F (36.7 C) (!) 97.5 F (36.4 C)  (!) 97.5 F (36.4 C)  TempSrc: Oral Oral  Oral  SpO2: 100% 93% 96%   Weight:  96.4 kg    Height:  6\' 1"  (1.854 m)       General appearance: fatigued and no distress Head: atraumatic, large scab from liquid nitrogen on top of his scalp Eyes: negative findings: lids and lashes normal, conjunctivae and sclerae normal, and corneas clear Resp: rales bilaterally Cardio: regular rate and rhythm and no rub GI: soft, non-tender; bowel sounds normal; no masses,  no organomegaly Extremities: edema 2+ ankle edema L>R  Labs: Basic Metabolic Panel: Recent Labs  Lab 11/20/22 1941  NA 137  K 4.7  CL 97*  CO2 26  GLUCOSE 141*  BUN 74*  CREATININE 4.20*  ALBUMIN 4.1  CALCIUM 9.2   Liver Function Tests: Recent Labs  Lab 11/20/22 1941  AST 22   ALT 23  ALKPHOS 49  BILITOT 0.8  PROT 7.1  ALBUMIN 4.1   No results for input(s): "LIPASE", "AMYLASE" in the last 168 hours. No results for input(s): "AMMONIA" in the last 168 hours. CBC: Recent Labs  Lab 11/20/22 1941  WBC 6.4  NEUTROABS 5.4  HGB 11.8*  HCT 36.3*  MCV 109.7*  PLT 160   PT/INR: @LABRCNTIP (inr:5) Cardiac Enzymes: )No results for input(s): "CKTOTAL", "CKMB", "CKMBINDEX", "TROPONINI" in the last 168 hours. CBG: Recent Labs  Lab 11/21/22 1203  GLUCAP 107*    Iron Studies: No results for input(s): "IRON", "TIBC", "TRANSFERRIN", "FERRITIN" in the last 168 hours.  Xrays/Other Studies: DG Chest Portable 1 View  Result Date: 11/20/2022 CLINICAL DATA:  Cardiomegaly. EXAM: PORTABLE CHEST 1 VIEW COMPARISON:  05/25/2022 FINDINGS: Cardiomegaly status post median sternotomy and CABG. Mild diffuse interstitial opacity. Small, layering pleural effusions. The visualized skeletal structures are unremarkable. IMPRESSION: Cardiomegaly with mild diffuse interstitial opacity and small, layering pleural effusions, consistent with edema. No focal airspace opacity. Electronically Signed   By: Jearld Lesch M.D.   On: 11/20/2022 20:50     Assessment/Plan:  AKI/CKD stage IV - in setting of acute on chronic systolic CHF c/w cardiorenal syndrome.  Agree with IV lasix but would hold jardiance and metoprolol given hypotension.  Agree with midodrine.  We have discussed RRT and he and his family are in agreement not to pursue any form of dialysis and I concur with their decision.  Will need to manage this medically and consult Palliative care if his condition deteriorates further.    Avoid nephrotoxic medications including NSAIDs and iodinated intravenous contrast exposure unless the latter is absolutely indicated.  Preferred narcotic agents for pain control are hydromorphone, fentanyl, and methadone. Morphine should not be used. Avoid Baclofen and avoid oral sodium phosphate and magnesium  citrate based laxatives / bowel preps. Continue strict Input and Output monitoring. Will monitor the patient closely with you and intervene or adjust therapy as indicated by changes in clinical status/labs  Acute on chronic systolic CHF/ICMP -  seen by cardiology and AHF team.  To repeat ECHO and start IV Lasix and follow.  Hypotension - ?cardiogenic shock.  Defer to AHF team regarding pressors or ionotropes. Pauci-immune vasculitis - continue with imuran for now and follow. CAD - elevated troponin but improving.  Follow with diuresis.  Cardiology following. Disposition - poor overall prognosis.  He has had progressive CKD to stage IV since developing cardiorenal syndrome.  We have discussed RRT and are both in agreement not to pursue any form of RRT given his advanced age, multiple co-morbidities, and his poor functional and nutritional status.  Nor would he tolerate it well given his declining cardiac function.   Would recommend palliative care consult to help set goals/limits of care.     Julien Nordmann Draden Cottingham 11/21/2022, 1:29 PM

## 2022-11-21 NOTE — Assessment & Plan Note (Signed)
Continue with supplemental 02 per [place]. No clinical signs of exacerbation.

## 2022-11-21 NOTE — Assessment & Plan Note (Signed)
Continue with levothyroxine  

## 2022-11-21 NOTE — H&P (Addendum)
History and Physical    Patient: Luke Mcbride YQM:[id number] DOB: [birth year] DOA: 11/20/2022 DOS: the patient was seen and examined on 11/21/2022 PCP: Patient, No Pcp Per  Patient coming from: Home  Chief Complaint:  Chief Complaint  Patient presents with   Shortness of Breath   HPI: Luke Mcbride is a 87 y.o. male with medical history significant of coronary artery disease, heart failure, CKD, hypertension, hyperlipidemia, ILD and history of vasculitis who presented with dyspnea.  He reports being at his baseline until 07/04, when he started to experience dyspnea on exertion. Progressive and worsening symptoms, associated with lower extremity edema, PND and orthopnea.  No improving or worsening factors, dyspnea became severe in intensity.  Because persistent and severe symptoms he came to the ED for further evaluation.    Patient denies dietary indiscretions or non compliance with his medications.  Denies weight gain.  He has maintained same dose of diuretic at home.   Review of Systems: As mentioned in the history of present illness. All other systems reviewed and are negative. Past Medical History:  Diagnosis Date   ALLERGIC RHINITIS    Anemia    hx   Barrett's esophagus    BOOP (bronchiolitis obliterans with organizing pneumonia) (HCC)    a. s/p R VATS 2008.   CAD (coronary artery disease)    a. 05/2000: NSTEMI/CABG x 6: LIMA->LAD, VG->D1, VG->OM1->2, VG->PDA->RPL;  b. 07/2007 MV: high lat infarct, no ischemia, EF 47%;  c. Cath/PCI: LM nl, LAD20p, 2m, D1 nl, D2 60-70ost, D3 nl, LCX 70ost, 121m, OM1/OM2 min irregs, RCA 30 diff, PDA 99, LIMA->LAD atretic, VG->D1 100, VG->OM1->2 100, VG->PDA->RPL 90p (4.0x23 Vision BMS);  c. 07/2012 Echo: EF 55%, gr1 DD.   Cataract    CHF (congestive heart failure) (HCC)    CKD (chronic kidney disease), stage III (HCC)    a. renal bx 2008: GLN with vasculitis   Diffuse large B cell lymphoma (HCC) 08/07/2016   GERD (gastroesophageal reflux  disease)    Glaucoma    a. Cannot see out of L eye.   Hematuria    Microscopic   Hyperglycemia    Patient reported while on prednisone, had to take insulin   Hyperlipemia    Hypertension    Hypothyroidism    ILD (interstitial lung disease) (HCC)    Iritis    Local infection of skin and subcutaneous tissue    Membranoproliferative nephritis    Myocardial infarction (HCC) 2002   Neutropenia, drug-induced (HCC)    Recurrent boils    Residual foreign body in soft tissue    Vasculitis (HCC)    a.  pauciimmune vasculitis with renal involvement and hx of transient hemoptysis in the past with associated BOOP, 2008 (renal bx 2008: GLN with vasculitis). b. History of treatment with 2 cycles of Cytoxan and pheresis. H/o hemoptysis and pulm hemorrhage with 2nd cycle of cytoxan.   Past Surgical History:  Procedure Laterality Date   BOWEL RESECTION N/A 07/12/2016   Procedure: SMALL BOWEL RESECTION;  Surgeon: Emelia Loron, MD;  Location: [facility] OR;  Service: General;  Laterality: N/A;   CATARACT EXTRACTION Bilateral    CORONARY ARTERY BYPASS GRAFT  2002   CORONARY STENT INTERVENTION N/A 03/31/2017   Procedure: CORONARY STENT INTERVENTION;  Surgeon: Swaziland, Peter M, MD;  Location: [facility] INVASIVE CV LAB;  Service: Cardiovascular;  Laterality: N/A;   IR FLUORO GUIDE PORT INSERTION RIGHT  08/20/2016   IR REMOVAL TUN ACCESS W/ PORT W/O FL MOD SED  01/20/2017   IR US GUIDE VASC ACCESS RIGHT  08/20/2016   LAPAROSCOPY N/A 07/12/2016   Procedure: LAPAROSCOPY DIAGNOSTIC;  Surgeon: Emelia Loron, MD;  Location: [facility] OR;  Service: General;  Laterality: N/A;   LEFT HEART CATHETERIZATION WITH CORONARY/GRAFT ANGIOGRAM N/A 07/26/2012   Procedure: LEFT HEART CATHETERIZATION WITH Isabel Caprice;  Surgeon: Iran Ouch, MD;  Location: [facility] CATH LAB;  Service: Cardiovascular;  Laterality: N/A;   LUNG BIOPSY     PERCUTANEOUS CORONARY STENT INTERVENTION (PCI-S)  07/26/2012   Procedure: PERCUTANEOUS CORONARY STENT  INTERVENTION (PCI-S);  Surgeon: Iran Ouch, MD;  Location: [facility] CATH LAB;  Service: Cardiovascular;;   RENAL BIOPSY     RIGHT/LEFT HEART CATH AND CORONARY/GRAFT ANGIOGRAPHY N/A 03/23/2017   Procedure: RIGHT/LEFT HEART CATH AND CORONARY/GRAFT ANGIOGRAPHY;  Surgeon: Swaziland, Peter M, MD;  Location: [facility] INVASIVE CV LAB;  Service: Cardiovascular;  Laterality: N/A;   TOE AMPUTATION  2009   hammer toe   Social History:  reports that he has never smoked. He has never used smokeless tobacco. He reports that he does not drink alcohol and does not use drugs.  No Known Allergies  Family History  Problem Relation Age of Onset   Heart disease Mother    Diabetes Mother    Prostate cancer Father    Depression Other    Diabetes Other    Prostate cancer Other    Colon polyps Neg Hx    Colon cancer Neg Hx    Rectal cancer Neg Hx    Stomach cancer Neg Hx    Esophageal cancer Neg Hx     Prior to Admission medications   Medication Sig Start Date End Date Taking? Authorizing Provider  acetaminophen (TYLENOL) 500 MG tablet Take 500 mg by mouth daily as needed for moderate pain or headache.     [provider]  amiodarone (PACERONE) 200 MG tablet Take 1 tablet (200 mg total) by mouth daily. 12/31/21   Laurey Morale, MD  azaTHIOprine (IMURAN) 50 MG tablet Take 100 mg by mouth daily.    [provider]  clopidogrel (PLAVIX) 75 MG tablet TAKE 1 TABLET BY MOUTH  DAILY 12/24/21   Laurey Morale, MD  dorzolamide-timolol (COSOPT) 22.3-6.8 MG/ML ophthalmic solution Place 1 drop into both eyes 2 (two) times daily.     [provider]  empagliflozin (JARDIANCE) 10 MG TABS tablet Take 1 tablet (10 mg total) by mouth daily before breakfast. 02/04/22   Laurey Morale, MD  finasteride (PROSCAR) 5 MG tablet Take 5 mg by mouth at bedtime.     [provider]  levothyroxine (SYNTHROID, LEVOTHROID) 100 MCG tablet Take 100 mcg by mouth daily before breakfast.    [provider]  metoprolol succinate (TOPROL-XL) 50 MG 24 hr tablet Take 1.5 tablets (75 mg total) by mouth daily. 02/10/22   Laurey Morale, MD  Multiple Vitamin (MULTIVITAMIN) tablet Take 1 tablet by mouth daily.    [provider]  nitroGLYCERIN (NITROSTAT) 0.4 MG SL tablet Place 1 tablet (0.4 mg total) under the tongue every 5 (five) minutes as needed for chest pain. 11/24/21   Jacklynn Ganong, FNP  pantoprazole (PROTONIX) 40 MG tablet TAKE 1 TABLET BY MOUTH  DAILY 11/30/21   Wendall Stade, MD  rosuvastatin (CRESTOR) 10 MG tablet Take 10 mg every morning by mouth. 03/22/17   [provider]  tamsulosin (FLOMAX) 0.4 MG CAPS capsule Take 0.4 mg by mouth at bedtime.    [provider]  torsemide (DEMADEX) 20 MG tablet 60 mg daily, alternating with 40 mg daily. 07/16/22   Laurey Morale, MD    Physical Exam: Vitals:   11/21/22 0500 11/21/22 0641 11/21/22 0720 11/21/22 1232  BP: 99/76 (!) 93/53  92/66  Pulse: 71 77  77  Resp: 18 (!) 30  17  Temp: 98 F (36.7 C) (!) 97.5 F (36.4 C)  (!) 97.5 F (36.4 C)  TempSrc: Oral Oral  Oral  SpO2: 100% 93% 96%   Weight:  96.4 kg    Height:  6\' 1"  (1.854 m)     Neurology awake and alert ENT with mild pallor Cardiovascular with S1 and S2 present and regular with no gallops or rubs, positive murmur systolic at the right lower sternal border Positive JVD Positive lower extremity edema ++ pitting Respiratory with rales bilaterally with no wheezing or rhonchi Abdomen with no distention, non tender Data Reviewed:   Na 137, K 4,7 Cl 97, bicarbonate 26, glucose 141, bun 74 cr 4,20 BNP 1,144 High sensitive troponin 774 and 680  Wbc 6,4 hgb 11,8 plt 160 Sars covid 19 negative   Chest radiograph with right rotation, cardiomegaly and bilateral hilar vascular congestion, small bilateral pleural effusions. Sternotomy wires in place.  EKG 76 bpm, normal axis, left bundle branch block, sinus rhythm with first degree AV block, no  significant ST segment or T wave changes.   Assessment and Plan: * Acute on chronic systolic CHF (congestive heart failure) (HCC) Echocardiogram with reduced LV systolic function to 35%. Global hypokinesis, mild LVH. RV systolic function with mild reduction, LA and RA with mild dilatation, no significant valvular disease.   Plan to continue diuresis with furosemide 80 mg IV q12 hrs. Midodrine for blood pressure support. B blockade with metoprolol 25 mg daily (succinate).  Limited pharmacologic options due to hypotension and low GFR.   Patient with history of non sustained VT and frequent PVC. Continue with amiodarone 200  mg daily.   CAD (coronary artery disease) No signs of acute coronary syndrome.  Plan to continue clopidogrel and statin.   CKD (chronic kidney disease) stage 4, GFR 15-29 ml/min (HCC) Reduced GFR with hypervolemia.  Plan to continue diuresis with loop diuretics. Follow up renal function in am, avoid hypotension or nephrotoxic medications.   ILD (interstitial lung disease) (HCC) Continue with supplemental 02 per [place]. No clinical signs of exacerbation.   Hypothyroidism Continue with levothyroxine.   Type 2 diabetes mellitus with hyperlipidemia (HCC) Add insulin sliding scale for glucose cover and monitoring.  Continue with statin therapy.  Vasculitis (HCC) Continue with azathioprine.  No signs of acute exacerbation.       Advance Care Planning:   Code Status: Full Code   Consults: cardiology and nephrology   Family Communication: no family at the bedside   Severity of Illness: The appropriate patient status for this patient is INPATIENT. Inpatient status is judged to be reasonable and necessary in order to provide the required intensity of service to ensure the patient's safety. The patient's presenting symptoms, physical exam findings, and initial radiographic and laboratory data in the context of their chronic comorbidities is felt to place them at  high risk for further clinical deterioration. Furthermore, it is not anticipated that the patient will be medically stable for discharge from the hospital within 2 midnights of admission.   * I certify that at the point of admission it is my clinical judgment that the patient will require inpatient hospital care  spanning beyond 2 midnights from the point of admission due to high intensity of service, high risk for further deterioration and high frequency of surveillance required.*  Author: Coralie Keens, MD 11/21/2022 3:29 PM  For on call review [email or web address].

## 2022-11-21 NOTE — Assessment & Plan Note (Addendum)
Echocardiogram with reduced LV systolic function to 35%. Global hypokinesis, mild LVH. RV systolic function with mild reduction, LA and RA with mild dilatation, no significant valvular disease.   Plan to continue diuresis with furosemide 80 mg IV q12 hrs. Midodrine for blood pressure support. B blockade with metoprolol 25 mg daily (succinate).  Limited pharmacologic options due to hypotension and low GFR.   Patient with history of non sustained VT and frequent PVC. Continue with amiodarone 200  mg daily.

## 2022-11-21 NOTE — Consult Note (Signed)
Advanced Heart Failure Team Consult Note   Primary Physician: Patient, No Pcp Per PCP-Cardiologist:  Charlton Haws, MD  Reason for Consultation: CHF exacerbation  HPI:    Luke Mcbride is seen today for evaluation of CHF at the request of Dr. Diona Browner.   87 y.o. with history of CAD, ischemic cardiomyopathy, BOOP, pauci-immune vasculitis with CKD stage 4, and diffuse large B cell lymphoma.  Patient had CABG in 2002.  In later years, he had PCI to native LAD and to sequential SVG-PDA/PLV.  LIMA is known to be atretic and SVG-D and sequential SVG-OM1/OM2 known to be occluded.  Echo in 10/21 showed EF 35-40%.  Patient was diagnosed with pauci-immune vasculitis with glomerulonephritis by biopsy in 2008.  He is followed by Dr. Arrie Aran and currently is on Imuran.  CKD stage 3b at baseline. He has been diagnosed by BOOP and used to be followed by pulmonary (not seen recently).  He was diagnosed with diffuse large B cell lymphoma in 3/18 and had 6 inches of his colon resected.  He was treated with chemotherapy.     Medical management of his cardiomyopathy has been limited by CKD stage 4.     Echo 3/23 EF 35%, global hypokinesis, mildly decreased RV systolic function. Zio monitor in 7/23 showed 11.3% PVCs and 6 runs NSVT, amiodarone was started.    At baseline, patient has NYHA class III symptoms with dyspnea with any moderate exertion.  He is not very active at baseline.  Since around 7/4, patient reports worsening dyspnea to the point where he has been short of breath with any exertion.  Rare atypical chest pain. +Orthopnea.  No fever/chills. He came to the ER, noted to have creatinine increased 3.3 => 4.2.  BNP elevated from his baseline.  CXR with pulmonary edema.  HS-TnI 774 => 680. Influenza and COVID-19 negative. SBP 80s-100s. He seems to follow a fairly high sodium diet at home, eats what he wants (bacon, etc).   Review of Systems: All systems reviewed and negative except as per HPI.    Home Medications Prior to Admission medications   Medication Sig Start Date End Date Taking? Authorizing Provider  acetaminophen (TYLENOL) 500 MG tablet Take 500 mg by mouth daily as needed for moderate pain or headache.     [provider]  amiodarone (PACERONE) 200 MG tablet Take 1 tablet (200 mg total) by mouth daily. 12/31/21   Laurey Morale, MD  azaTHIOprine (IMURAN) 50 MG tablet Take 100 mg by mouth daily.    [provider]  clopidogrel (PLAVIX) 75 MG tablet TAKE 1 TABLET BY MOUTH  DAILY 12/24/21   Laurey Morale, MD  dorzolamide-timolol (COSOPT) 22.3-6.8 MG/ML ophthalmic solution Place 1 drop into both eyes 2 (two) times daily.     [provider]  empagliflozin (JARDIANCE) 10 MG TABS tablet Take 1 tablet (10 mg total) by mouth daily before breakfast. 02/04/22   Laurey Morale, MD  finasteride (PROSCAR) 5 MG tablet Take 5 mg by mouth at bedtime.     [provider]  levothyroxine (SYNTHROID, LEVOTHROID) 100 MCG tablet Take 100 mcg by mouth daily before breakfast.    [provider]  metoprolol succinate (TOPROL-XL) 50 MG 24 hr tablet Take 1.5 tablets (75 mg total) by mouth daily. 02/10/22   Laurey Morale, MD  Multiple Vitamin (MULTIVITAMIN) tablet Take 1 tablet by mouth daily.    [provider]  nitroGLYCERIN (NITROSTAT) 0.4 MG SL tablet Place 1  tablet (0.4 mg total) under the tongue every 5 (five) minutes as needed for chest pain. 11/24/21   Jacklynn Ganong, FNP  pantoprazole (PROTONIX) 40 MG tablet TAKE 1 TABLET BY MOUTH  DAILY 11/30/21   Wendall Stade, MD  rosuvastatin (CRESTOR) 10 MG tablet Take 10 mg every morning by mouth. 03/22/17   [provider]  tamsulosin (FLOMAX) 0.4 MG CAPS capsule Take 0.4 mg by mouth at bedtime.    [provider]  torsemide (DEMADEX) 20 MG tablet 60 mg daily, alternating with 40 mg daily. 07/16/22   Laurey Morale, MD    Past Medical History: 1. Type 2 diabetes with  peripheral neuropathy 2. HTN 3. Diffuse large B cell lymphoma: Diagnosed 3/18, 6 inches of colon resected and treated with chemotherapy.  4. BOOP: Followed by pulmonary  5. GERD with Barrett's esophagus.  6. Glomerulonephritis with vasculitis: Pauci-immune vasculitis. Has had associated pulmonary hemorrhage.  - Prior treatment with Cytoxan and plasmapheresis, currently on Imuran.  - CKD stage 4 7. Chronic systolic CHF: Ischemic cardiomyopathy.   - Echo (9/18): EF 35-40%.  - Echo (10/21) with EF 35-40%, mild LVH, severe LAE, normal RV - Echo (3/23): EF 35%, global hypokinesis, mildly decreased RV systolic function.  8. CAD: CABG x 6 in 1/02 with LIMA-LAD, SVG-D1, seq SVG-OM1/2, seq SVG-PDA/PLV.  - NSTEMI 3/14: BMS to seq SVG-PDA/PLV, LIMA atretic but good flow down LAD, SVG-OM1/2 and SVG-D occluded.  - Angina 11/18: PCI to 80% mid LAD stenosis, seq SVG-PDA/PLV remained patent, other grafts occluded (LIMA atretic).  9. PVCs: Zio in 7/23 showed 11.3% PVCs and 6 runs NSVT. 10. PAD: Peripheral arterial evaluation in 3/24 with abnormal TBIs but normal ABIs.    Past Surgical History: Past Surgical History:  Procedure Laterality Date   BOWEL RESECTION N/A 07/12/2016   Procedure: SMALL BOWEL RESECTION;  Surgeon: Emelia Loron, MD;  Location: [facility] OR;  Service: General;  Laterality: N/A;   CATARACT EXTRACTION Bilateral    CORONARY ARTERY BYPASS GRAFT  2002   CORONARY STENT INTERVENTION N/A 03/31/2017   Procedure: CORONARY STENT INTERVENTION;  Surgeon: Swaziland, Peter M, MD;  Location: [facility] INVASIVE CV LAB;  Service: Cardiovascular;  Laterality: N/A;   IR FLUORO GUIDE PORT INSERTION RIGHT  08/20/2016   IR REMOVAL TUN ACCESS W/ PORT W/O FL MOD SED  01/20/2017   IR US GUIDE VASC ACCESS RIGHT  08/20/2016   LAPAROSCOPY N/A 07/12/2016   Procedure: LAPAROSCOPY DIAGNOSTIC;  Surgeon: Emelia Loron, MD;  Location: [facility] OR;  Service: General;  Laterality: N/A;   LEFT HEART CATHETERIZATION WITH CORONARY/GRAFT  ANGIOGRAM N/A 07/26/2012   Procedure: LEFT HEART CATHETERIZATION WITH Isabel Caprice;  Surgeon: Iran Ouch, MD;  Location: [facility] CATH LAB;  Service: Cardiovascular;  Laterality: N/A;   LUNG BIOPSY     PERCUTANEOUS CORONARY STENT INTERVENTION (PCI-S)  07/26/2012   Procedure: PERCUTANEOUS CORONARY STENT INTERVENTION (PCI-S);  Surgeon: Iran Ouch, MD;  Location: [facility] CATH LAB;  Service: Cardiovascular;;   RENAL BIOPSY     RIGHT/LEFT HEART CATH AND CORONARY/GRAFT ANGIOGRAPHY N/A 03/23/2017   Procedure: RIGHT/LEFT HEART CATH AND CORONARY/GRAFT ANGIOGRAPHY;  Surgeon: Swaziland, Peter M, MD;  Location: [facility] INVASIVE CV LAB;  Service: Cardiovascular;  Laterality: N/A;   TOE AMPUTATION  2009   hammer toe    Family History: Family History  Problem Relation Age of Onset   Heart disease Mother    Diabetes Mother    Prostate cancer Father    Depression Other  Diabetes Other    Prostate cancer Other    Colon polyps Neg Hx    Colon cancer Neg Hx    Rectal cancer Neg Hx    Stomach cancer Neg Hx    Esophageal cancer Neg Hx     Social History: Social History   Socioeconomic History   Marital status: Married    Spouse name: Not on file   Number of children: Not on file   Years of education: Not on file   Highest education level: Not on file  Occupational History   Occupation: [employer]  Tobacco Use   Smoking status: Never   Smokeless tobacco: Never  Vaping Use   Vaping Use: Never used  Substance and Sexual Activity   Alcohol use: No   Drug use: No   Sexual activity: Not on file  Other Topics Concern   Not on file  Social History Narrative   Regular exercise - yes      [facility] (07/12/16):   Lives with his wife. Currently works  Acupuncturist.  no pets currently. No bird or mold exposure.   Social Determinants of Health   Financial Resource Strain: Low Risk  (02/04/2022)   Overall Financial Resource Strain (CARDIA)    Difficulty of Paying Living  Expenses: Not very hard  Food Insecurity: No Food Insecurity (02/04/2022)   Hunger Vital Sign    Worried About Running Out of Food in the Last Year: Never true    Ran Out of Food in the Last Year: Never true  Transportation Needs: No Transportation Needs (02/04/2022)   PRAPARE - Administrator, Civil Service (Medical): No    Lack of Transportation (Non-Medical): No  Physical Activity: Not on file  Stress: Not on file  Social Connections: Not on file    Allergies:  No Known Allergies  Objective:    Vital Signs:   Temp:  [97.5 F (36.4 C)-98 F (36.7 C)] 97.5 F (36.4 C) (07/07 0641) Pulse Rate:  [67-82] 77 (07/07 0641) Resp:  [10-30] 30 (07/07 0641) BP: (91-111)/(53-89) 93/53 (07/07 0641) SpO2:  [93 %-100 %] 96 % (07/07 0720) Weight:  [90.7 kg-96.4 kg] 96.4 kg (07/07 0641)    Weight change: Filed Weights   11/20/22 1938 11/21/22 0641  Weight: 90.7 kg 96.4 kg    Intake/Output:   Intake/Output Summary (Last 24 hours) at 11/21/2022 1056 Last data filed at 11/21/2022 1034 Gross per 24 hour  Intake 200 ml  Output 575 ml  Net -375 ml      Physical Exam    General:  Well appearing. No resp difficulty HEENT: normal Neck: supple. JVP 12 cm. Carotids 2+ bilat; no bruits. No lymphadenopathy or thyromegaly appreciated. Cor: PMI nondisplaced. Regular rate & rhythm. No rubs, gallops or murmurs. Lungs: clear Abdomen: soft, nontender, nondistended. No hepatosplenomegaly. No bruits or masses. Good bowel sounds. Extremities: no cyanosis, clubbing, rash. 1+ ankle edema.  Neuro: alert & orientedx3, cranial nerves grossly intact. moves all 4 extremities w/o difficulty. Affect pleasant   Telemetry   NSR 80s (personally reviewed)  EKG    NSR, LBBB 142 msec, 1st degree AVB.  Personally reviewed  Labs   Basic Metabolic Panel: Recent Labs  Lab 11/20/22 1941  NA 137  K 4.7  CL 97*  CO2 26  GLUCOSE 141*  BUN 74*  CREATININE 4.20*  CALCIUM 9.2    Liver  Function Tests: Recent Labs  Lab 11/20/22 1941  AST 22  ALT 23  ALKPHOS 49  BILITOT 0.8  PROT 7.1  ALBUMIN 4.1   No results for input(s): "LIPASE", "AMYLASE" in the last 168 hours. No results for input(s): "AMMONIA" in the last 168 hours.  CBC: Recent Labs  Lab 11/20/22 1941  WBC 6.4  NEUTROABS 5.4  HGB 11.8*  HCT 36.3*  MCV 109.7*  PLT 160    Cardiac Enzymes: No results for input(s): "CKTOTAL", "CKMB", "CKMBINDEX", "TROPONINI" in the last 168 hours.  BNP: BNP (last 3 results) Recent Labs    04/05/22 1115 05/25/22 1752 11/20/22 1941  BNP 220.1* 134.6* 1,144.7*    ProBNP (last 3 results) No results for input(s): "PROBNP" in the last 8760 hours.   CBG: No results for input(s): "GLUCAP" in the last 168 hours.  Coagulation Studies: No results for input(s): "LABPROT", "INR" in the last 72 hours.   Imaging   DG Chest Portable 1 View  Result Date: 11/20/2022 CLINICAL DATA:  Cardiomegaly. EXAM: PORTABLE CHEST 1 VIEW COMPARISON:  05/25/2022 FINDINGS: Cardiomegaly status post median sternotomy and CABG. Mild diffuse interstitial opacity. Small, layering pleural effusions. The visualized skeletal structures are unremarkable. IMPRESSION: Cardiomegaly with mild diffuse interstitial opacity and small, layering pleural effusions, consistent with edema. No focal airspace opacity. Electronically Signed   By: Jearld Lesch M.D.   On: 11/20/2022 20:50     Medications:     Current Medications:  amiodarone  200 mg Oral Daily   azaTHIOprine  100 mg Oral Daily   clopidogrel  75 mg Oral Daily   dorzolamide-timolol  1 drop Both Eyes BID   [START ON 11/22/2022] empagliflozin  10 mg Oral QAC breakfast   enoxaparin (LOVENOX) injection  30 mg Subcutaneous Q24H   finasteride  5 mg Oral QHS   furosemide  80 mg Intravenous BID   levothyroxine  100 mcg Oral Q0600   metoprolol succinate  25 mg Oral Daily   midodrine  5 mg Oral TID WC   multivitamin with minerals  1 tablet Oral  Daily   pantoprazole  40 mg Oral Daily   rosuvastatin  10 mg Oral Daily   tamsulosin  0.4 mg Oral QHS    Infusions:    Assessment/Plan   1. Acute on chronic systolic CHF: Ischemic cardiomyopathy.  Last echo 3/23 with EF 35%, mildly decreased RV systolic function.  CHF has been complicated by CKD stage 4 which limits GDMT. He was admitted with worsened creatinine to 4.2 and NYHA class IIIb symptoms.  He is volume overloaded on exam.  SBP soft, running 80s-100s.  Lactate normal at 1.9 this morning. - Lasix 80 mg IV bid.  - Agree with low dose midodrine for now, 5 mg tid.   - Continue Jardiance 10 mg daily. - Decrease Toprol XL 75 mg => 25 daily.  - He did not tolerate Verquvo (no specific complaint, "did not feel well on it.") - No ARB/ARNI/spironolactone with CKD stage 4.   - Needs echo, will arrange.  - Not ICD candidate with age. Has new LBBB with QRS 142, doubt he would benefit significantly from CRT at this point.  - Not candidate for advanced therapies with age and fragility.  2. CAD: S/p CABG 1/02 with subsequent PCI to seq SVG-PDA/PLV and later to native LAD.  He has rare atypical chest pain.  HS-TnI mildly elevated with no trend (774 => 680).  Suspect demand ischemia with volume overload +AKI.    - Continue Plavix 75 daily.  - Continue Crestor 10 mg daily.  3. AKI  on CKD stage 4: From pauci-immune vasculitis with glomerulonephritis. Creatinine up to 4.2 from prior 3.3 in setting of volume overload.  - Will try to diurese as above.  - Think dialysis would be difficult for him with age, low BP, and poor functional status.  - Will ask Dr. Arrie Aran to see (his nephrologist).  4. PVCs: Zio in 7/23 with 11% PVCs and 6 NSVT runs.  - Continue amiodarone 200 mg daily.   5. PAD: 3/24 evaluation with abnormal TBIs, normal ABIs.   Will try to diurese him, but long-term prognosis poor if renal function continues to worsen.  As above, I do not think he would do well with HD.   Length of  Stay: 0  Marca Ancona, MD  11/21/2022, 10:56 AM  Advanced Heart Failure Team Pager [phone] (M-F; 7a - 5p)  Please contact [facility] for night-coverage after hours (4p -7a ) and weekends on [email or web address]

## 2022-11-21 NOTE — Consult Note (Addendum)
Cardiology Consultation   Patient ID: AKEIM REFUERZO MRN: [id number]; DOB: [birth year]  Admit date: 11/20/2022 Date of Consult: 11/21/2022  PCP:  Patient, No Pcp Per   [facility] Providers Cardiologist:  Charlton Haws, MD   {   Patient Profile:   Luke Mcbride is a 87 y.o. 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  History of Present Illness:   [name] with above PMH presented to ER 11/20/22 with SOB and cough over the past 2 days. He endorses SOB with exertion and at rest. He felt he can't get enough air in.  He felt his chronic leg edema does not seem to be changed significantly. He recalls having some random onset of chest pressure with SOB that resolves with taking a deep breath. He denied any significant chest pain, dizziness, syncope. He felt his urine output is decreasing and is complaint with his diuretic at home with torsemide 60/40mg  alternating. He denied any change of his diet, salt or fluid intake.   Admission diagnostic so far revealed elevated BUN 74, creatinine 4.2, eGFR 13.  BNP elevated 1144.  High sensitive troponin 774 > 680.  CBC differential with hemoglobin 11.8, lymphopenia.  Respiratory viral panel negative. Chest x-ray revealed cardiomegaly with mild diffuse interstitial opacity and small layering pleural effusions consistent with edema. EKG showed sinus rhythm, first degree AVB, new LBBB. He was concerned for decompensated CHF, admitted to hospital medicine, cardiology is consulted  today for further input.   Per chart review, she has multivessel CAD, underwent CABG in 2002 with LIMA-LAD, SVG-D1, seq SVG-OM1/2, seq SVG-PDA/PLV.  He suffered non-STEMI 2014 required BMS to seq SVG-PDA/PLV, LIMA atretic but good flow down LAD, SVG-OM1/2 and SVG-D were occluded.  He suffered unstable angina 03/2017, underwent staged successful DES to mid LAD, patent SVG to PDA/PLOM, patent stent in proximal SVG, occluded SVG to first diagonal, occluded SVG to OM, atretic LIMA to LAD.  Most recent echocardiogram was done on 07/24/2021 revealing LVEF 35%, global hypokinesis, mild LVH, grade 1 DD, mildly reduced RV, mild LAE and RAE, mild MR, mild dilatation of ascending aorta 37 mm (No significant change comparing to echo from 03/04/2020).  ZIO monitor from 11/2021 revealing 11.3% PVCs and 6 runs of NSVT.  He was started on amiodarone therapy.  He follows advanced heart failure Dr. Shirlee Latch, last seen in the office 08/05/2022, remains on GDMT with torsemide 60 mg alternating with 40 mg daily, Jardiance 10 mg daily, Toprol-XL 75 mg daily.  He did not tolerate Verquvo.  He has CKD stage IV secondary to renal vasculitis, which is limiting GDMT for CHF.  He was felt not a good ICD candidate due to age and unlikely benefit from CRT with narrowed QRS.   In addition, he was diagnosed with diffuse large B-cell lymphoma of small bowel and underwent colon resection and chemotherapy in 2008, is in current remission per oncology Dr. Candise Che office note on 03/12/2022.  Reportedly he was diagnosed with BOOP but not currently following a pulmonologist.  He is followed by Dr. Arrie Aran for pauci-immune vasculitis with glomerulonephritis and remains on chronic Imuran.   Past Medical History:  Diagnosis Date   ALLERGIC RHINITIS    Anemia    hx   Barrett's esophagus    BOOP (bronchiolitis obliterans with organizing pneumonia) (HCC)    a. s/p R VATS 2008.   CAD (coronary artery disease)    a. 05/2000: NSTEMI/CABG x 6: LIMA->LAD,  VG->D1, VG->OM1->2, VG->PDA->RPL;  b. 07/2007 MV: high lat infarct, no ischemia, EF 47%;  c. Cath/PCI: LM nl, LAD20p, 17m, D1 nl, D2 60-70ost, D3 nl, LCX 70ost, 156m, OM1/OM2 min irregs, RCA 30 diff, PDA 99, LIMA->LAD atretic, VG->D1 100, VG->OM1->2 100, VG->PDA->RPL 90p (4.0x23 Vision BMS);  c. 07/2012 Echo: EF 55%, gr1 DD.   Cataract    CHF (congestive heart failure) (HCC)    CKD (chronic kidney disease), stage III (HCC)    a. renal bx 2008: GLN with vasculitis   Diffuse large B cell lymphoma (HCC) 08/07/2016   GERD (gastroesophageal reflux disease)    Glaucoma    a. Cannot see out of L eye.   Hematuria    Microscopic   Hyperglycemia    Patient reported while on prednisone, had to take insulin   Hyperlipemia    Hypertension    Hypothyroidism    ILD (interstitial lung disease) (HCC)    Iritis    Local infection of skin and subcutaneous tissue    Membranoproliferative nephritis    Myocardial infarction (HCC) 2002   Neutropenia, drug-induced (HCC)    Recurrent boils    Residual foreign body in soft tissue    Vasculitis (HCC)    a.  pauciimmune vasculitis with renal involvement and hx of transient hemoptysis in the past with associated BOOP, 2008 (renal bx 2008: GLN with vasculitis). b. History of treatment with 2 cycles of Cytoxan and pheresis. H/o hemoptysis and pulm hemorrhage with 2nd cycle of cytoxan.    Past Surgical History:  Procedure Laterality Date   BOWEL RESECTION N/A 07/12/2016   Procedure: SMALL BOWEL RESECTION;  Surgeon: Emelia Loron, MD;  Location: [facility] OR;  Service: General;  Laterality: N/A;   CATARACT EXTRACTION Bilateral    CORONARY ARTERY BYPASS GRAFT  2002   CORONARY STENT INTERVENTION N/A 03/31/2017   Procedure: CORONARY STENT INTERVENTION;  Surgeon: Swaziland, Peter M, MD;  Location: [facility] INVASIVE CV LAB;  Service: Cardiovascular;  Laterality: N/A;   IR FLUORO GUIDE PORT INSERTION RIGHT  08/20/2016   IR REMOVAL TUN ACCESS W/ PORT W/O FL MOD SED  01/20/2017   IR US  GUIDE VASC ACCESS RIGHT  08/20/2016   LAPAROSCOPY N/A 07/12/2016   Procedure: LAPAROSCOPY DIAGNOSTIC;  Surgeon: Emelia Loron, MD;  Location: [facility] OR;  Service: General;  Laterality: N/A;   LEFT HEART CATHETERIZATION WITH CORONARY/GRAFT ANGIOGRAM N/A 07/26/2012   Procedure: LEFT HEART CATHETERIZATION WITH Isabel Caprice;  Surgeon: Iran Ouch, MD;  Location: [facility] CATH LAB;  Service: Cardiovascular;  Laterality: N/A;   LUNG BIOPSY     PERCUTANEOUS CORONARY STENT INTERVENTION (PCI-S)  07/26/2012   Procedure: PERCUTANEOUS CORONARY STENT INTERVENTION (PCI-S);  Surgeon: Iran Ouch, MD;  Location: [facility] CATH LAB;  Service: Cardiovascular;;   RENAL BIOPSY     RIGHT/LEFT HEART CATH AND CORONARY/GRAFT ANGIOGRAPHY N/A 03/23/2017   Procedure: RIGHT/LEFT HEART CATH AND CORONARY/GRAFT ANGIOGRAPHY;  Surgeon: Swaziland, Peter M, MD;  Location: [facility] INVASIVE CV LAB;  Service: Cardiovascular;  Laterality: N/A;   TOE AMPUTATION  2009   hammer toe  Home Medications:  Prior to Admission medications   Medication Sig Start Date End Date Taking? Authorizing Provider  acetaminophen (TYLENOL) 500 MG tablet Take 500 mg by mouth daily as needed for moderate pain or headache.     [provider]  amiodarone (PACERONE) 200 MG tablet Take 1 tablet (200 mg total) by mouth daily. 12/31/21   Laurey Morale, MD  azaTHIOprine (IMURAN) 50 MG tablet Take 100 mg by mouth daily.    [provider]  clopidogrel (PLAVIX) 75 MG tablet TAKE 1 TABLET BY MOUTH  DAILY 12/24/21   Laurey Morale, MD  dorzolamide-timolol (COSOPT) 22.3-6.8 MG/ML ophthalmic solution Place 1 drop into both eyes 2 (two) times daily.     [provider]  empagliflozin (JARDIANCE) 10 MG TABS tablet Take 1 tablet (10 mg total) by mouth daily before breakfast. 02/04/22   Laurey Morale, MD  finasteride (PROSCAR) 5 MG tablet Take 5 mg by mouth at bedtime.     [provider]  levothyroxine (SYNTHROID, LEVOTHROID) 100  MCG tablet Take 100 mcg by mouth daily before breakfast.    [provider]  metoprolol succinate (TOPROL-XL) 50 MG 24 hr tablet Take 1.5 tablets (75 mg total) by mouth daily. 02/10/22   Laurey Morale, MD  Multiple Vitamin (MULTIVITAMIN) tablet Take 1 tablet by mouth daily.    [provider]  nitroGLYCERIN (NITROSTAT) 0.4 MG SL tablet Place 1 tablet (0.4 mg total) under the tongue every 5 (five) minutes as needed for chest pain. 11/24/21   Jacklynn Ganong, FNP  pantoprazole (PROTONIX) 40 MG tablet TAKE 1 TABLET BY MOUTH  DAILY 11/30/21   Wendall Stade, MD  rosuvastatin (CRESTOR) 10 MG tablet Take 10 mg every morning by mouth. 03/22/17   [provider]  tamsulosin (FLOMAX) 0.4 MG CAPS capsule Take 0.4 mg by mouth at bedtime.    [provider]  torsemide (DEMADEX) 20 MG tablet 60 mg daily, alternating with 40 mg daily. 07/16/22   Laurey Morale, MD    Inpatient Medications: Scheduled Meds:  amiodarone  200 mg Oral Daily   azaTHIOprine  100 mg Oral Daily   clopidogrel  75 mg Oral Daily   dorzolamide-timolol  1 drop Both Eyes BID   [START ON 11/22/2022] empagliflozin  10 mg Oral QAC breakfast   enoxaparin (LOVENOX) injection  30 mg Subcutaneous Q24H   finasteride  5 mg Oral QHS   furosemide  80 mg Intravenous BID   levothyroxine  100 mcg Oral Q0600   metoprolol succinate  25 mg Oral Daily   midodrine  5 mg Oral BID WC   multivitamin with minerals  1 tablet Oral Daily   pantoprazole  40 mg Oral Daily   rosuvastatin  10 mg Oral Daily   tamsulosin  0.4 mg Oral QHS   Continuous Infusions:  PRN Meds: acetaminophen **OR** acetaminophen, nitroGLYCERIN, ondansetron **OR** ondansetron (ZOFRAN) IV, mouth rinse  Allergies:   No Known Allergies  Social History:   Social History   Socioeconomic History   Marital status: Married    Spouse name: Not on file   Number of children: Not on file   Years of education: Not on file   Highest education level:  Not on file  Occupational History   Occupation: [employer]  Tobacco Use   Smoking status: Never   Smokeless tobacco: Never  Vaping Use   Vaping Use: Never used  Substance and Sexual Activity   Alcohol use: No  Drug use: No   Sexual activity: Not on file  Other Topics Concern   Not on file  Social History Narrative   Regular exercise - yes      [facility] (07/12/16):   Lives with his wife. Currently works  Acupuncturist.  no pets currently. No bird or mold exposure.   Social Determinants of Health   Financial Resource Strain: Low Risk  (02/04/2022)   Overall Financial Resource Strain (CARDIA)    Difficulty of Paying Living Expenses: Not very hard  Food Insecurity: No Food Insecurity (02/04/2022)   Hunger Vital Sign    Worried About Running Out of Food in the Last Year: Never true    Ran Out of Food in the Last Year: Never true  Transportation Needs: No Transportation Needs (02/04/2022)   PRAPARE - Administrator, Civil Service (Medical): No    Lack of Transportation (Non-Medical): No  Physical Activity: Not on file  Stress: Not on file  Social Connections: Not on file  Intimate Partner Violence: Not on file    Family History:    Family History  Problem Relation Age of Onset   Heart disease Mother    Diabetes Mother    Prostate cancer Father    Depression Other    Diabetes Other    Prostate cancer Other    Colon polyps Neg Hx    Colon cancer Neg Hx    Rectal cancer Neg Hx    Stomach cancer Neg Hx    Esophageal cancer Neg Hx      ROS:  Constitutional: Denied fever, chills, malaise, night sweats Eyes: Denied vision change or loss Ears/Nose/Mouth/Throat: Denied ear ache, sore throat, coughing, sinus pain Cardiovascular: see HPI  Respiratory:see HPI  Gastrointestinal: Denied nausea, vomiting, abdominal pain, diarrhea Genital/Urinary: Denied dysuria, hematuria, urinary frequency/urgency Musculoskeletal: Denied muscle ache, joint pain,  weakness Skin: Denied rash, wound Neuro: Denied headache, dizziness, syncope Psych: Denied history of depression/anxiety  Endocrine: history of diabetes   Physical Exam/Data:   Vitals:   11/21/22 0400 11/21/22 0500 11/21/22 0641 11/21/22 0720  BP: 105/89 99/76 (!) 93/53   Pulse: 75 71 77   Resp: 10 18 (!) 30   Temp:  98 F (36.7 C) (!) 97.5 F (36.4 C)   TempSrc:  Oral Oral   SpO2: 94% 100% 93% 96%  Weight:   96.4 kg   Height:   6\' 1"  (1.854 m)     Intake/Output Summary (Last 24 hours) at 11/21/2022 0921 Last data filed at 11/21/2022 0700 Gross per 24 hour  Intake --  Output 575 ml  Net -575 ml      11/21/2022    6:41 AM 11/20/2022    7:38 PM 08/05/2022    1:35 PM  Last 3 Weights  Weight (lbs) 212 lb 8.4 oz 200 lb 213 lb 6.4 oz  Weight (kg) 96.4 kg 90.719 kg 96.798 kg     Body mass index is 28.04 kg/m.   Vitals:  Vitals:   11/21/22 0641 11/21/22 0720  BP: (!) 93/53   Pulse: 77   Resp: (!) 30   Temp: (!) 97.5 F (36.4 C)   SpO2: 93% 96%   General Appearance: In no apparent distress, laying in bed, chronic ill appearing  HEENT: Frontal scalp with skin cancer lesion removal scar  Neck: Supple, trachea midline, JVD elevated to upper neck with HOB 30 degree  Cardiovascular: Regular rate and rhythm, normal S1-S2,  no murmur Respiratory: Resting breathing  unlabored, lungs sounds decreased at base bilaterally, fine crackles, no use of accessory muscles. On room air.   Gastrointestinal: Bowel sounds positive, abdomen soft, non-tender, non-distended.  Extremities: Able to move all extremities in bed without difficulty, 2+ pitting edema of BLE Musculoskeletal: Normal muscle bulk and tone Skin: Intact, warm, dry. petechiae noted in both arms  Neurologic: Alert, oriented to person, place and time. Fluent speech, no cognitive deficit, no gross focal neuro deficit Psychiatric: Normal affect. Mood is appropriate.     EKG:  The EKG was personally reviewed and demonstrates:     EKG from 11/20/2022 revealed sinus rhythm, first-degree AV block, new LBBB  Telemetry:  Telemetry was personally reviewed and demonstrates:    Sinus rhythm , occasional PVCs   Relevant CV Studies:   Event monitor 12/08/21:  1. Predominant NSR 2. 6 runs of NSVT, longest 19 beats.  3. 11.3% PVCs  Echo from 07/24/21:  1. Left ventricular ejection fraction, by estimation, is 35%. The left  ventricle has moderately decreased function. The left ventricle  demonstrates global hypokinesis. There is mild left ventricular  hypertrophy. Left ventricular diastolic parameters are   consistent with Grade I diastolic dysfunction (impaired relaxation).   2. Right ventricular systolic function is mildly reduced. The right  ventricular size is normal. Tricuspid regurgitation signal is inadequate  for assessing PA pressure.   3. Left atrial size was mildly dilated.   4. Right atrial size was mildly dilated.   5. The mitral valve is normal in structure. Mild mitral valve  regurgitation. No evidence of mitral stenosis.   6. The aortic valve is tricuspid. There is mild calcification of the  aortic valve. Aortic valve regurgitation is not visualized. No aortic  stenosis is present.   7. Aortic dilatation noted. There is mild dilatation of the ascending  aorta, measuring 37 mm.   8. The inferior vena cava is normal in size with greater than 50%  respiratory variability, suggesting right atrial pressure of 3 mmHg.    R/L heart cath 03/23/2017 showed:  LV end diastolic pressure is normal. Prox LAD to Mid LAD lesion is 90% stenosed. Ost 1st Diag lesion is 95% stenosed. Ost Cx to Prox Cx lesion is 80% stenosed. Ost 2nd Mrg to 2nd Mrg lesion is 85% stenosed. Ost RPDA to RPDA lesion is 100% stenosed. Post Atrio lesion is 80% stenosed. Origin to Prox Graft lesion before RPDA is 25% stenosed. Origin to Prox Graft lesion is 100% stenosed. Origin lesion is 100% stenosed. Prox Graft to Mid Graft lesion  is 100% stenosed.   1. Severe 3 vessel obstructive CAD. 2. Patent SVG to PDA/PLOM. Stent in proximal SVG is patent 3. Occluded SVG to the first diagonal. The first diagonal is small in caliber 4. Occluded SVG to OM 5. Atretic LIMA to the LAD 6. Normal LVEDP 7. Normal right heart pressures.    Plan: The culprit for this patient's symptoms is the new high grade stenosis in the mid LAD which was not present previously. I would recommend PCI of the mid LAD with stenting. Given his renal insufficiency this would be best done as a staged procedure. Will hydrate for 6 hours today. Repeat renal panel in 48 hours. If renal function is stable will plan to return next week for PCI.   Laboratory Data:  High Sensitivity Troponin:   Recent Labs  Lab 11/20/22 1941 11/20/22 2135  TROPONINIHS 774* 680*     Chemistry Recent Labs  Lab 11/20/22 1941  NA 137  K 4.7  CL 97*  CO2 26  GLUCOSE 141*  BUN 74*  CREATININE 4.20*  CALCIUM 9.2  GFRNONAA 13*  ANIONGAP 14    Recent Labs  Lab 11/20/22 1941  PROT 7.1  ALBUMIN 4.1  AST 22  ALT 23  ALKPHOS 49  BILITOT 0.8    Hematology Recent Labs  Lab 11/20/22 1941  WBC 6.4  RBC 3.31*  HGB 11.8*  HCT 36.3*  MCV 109.7*  MCH 35.6*  MCHC 32.5  RDW 16.4*  PLT 160    BNP Recent Labs  Lab 11/20/22 1941  BNP 1,144.7*     Radiology/Studies:  DG Chest Portable 1 View  Result Date: 11/20/2022 CLINICAL DATA:  Cardiomegaly. EXAM: PORTABLE CHEST 1 VIEW COMPARISON:  05/25/2022 FINDINGS: Cardiomegaly status post median sternotomy and CABG. Mild diffuse interstitial opacity. Small, layering pleural effusions. The visualized skeletal structures are unremarkable. IMPRESSION: Cardiomegaly with mild diffuse interstitial opacity and small, layering pleural effusions, consistent with edema. No focal airspace opacity. Electronically Signed   By: Jearld Lesch M.D.   On: 11/20/2022 20:50     Assessment and Plan:   Acute on chronic systolic and  diastolic heart failure -Presented with cough, shortness of breath on exertion and at rest, intermittent chest pressure over the past 2 days -BNP elevated 1144, high sensitive troponin 774 >680, elevated creatinine 4.2, BUN 74 ,and eGFR 13 -Chest x-ray concerning for pulmonary edema -EKG revealed sinus rhythm, first-degree AV block, new LBBB -Most recent echo from 07/24/2021 with LVEF 35%, grade 1 DD, mildly reduced RV, mild LAE and RAE, mild MR, mild dilatation of ascending aorta 37 mm -Admission weight 212.52 pounds today, weight was 213 pounds on 08/05/2022 office visit, not significantly changed -Clinically he appears hypervolemic/not low output, s/p IV Lasix 80mg  x1 at ED, recommend start IV Lasix 80mg  BID, hold PTA torsemide 60/40mg  alternating dosing, please track strict I&O, daily weight  -Will check lactic acid, update Echo - GDMT: on PTA torsemide 60 mg alternating 40 mg daily, Toprol XL of 75 mg daily, Jardiance 10 mg daily; historically not tolerating Verquvo and felt not candidate for ICD/CRT; recommend reduce Toprol XL to 25mg  daily, hold torsemide and jardiance; renal disease remains limiting  - messaged AHF team, may request consult if indicated   NSTEMI CAD with hx of CABG 2002 and PCIs last on 2018  -Patient has multivessel CAD required CABG and PCI's in the past (see above history summary) -He had reported intermittent chest pressure with SOB at random onset that improves with taking deep breath, atypical -High sensitive troponin 774>680, in the setting of decompensated heart failure and AKI on CKD stage IV, possibly demand ischemia/decreased renal clearance -He is not a candidate for invasive cardiac catheterization due to deterioration of renal function, recommend medical therapy for CAD: Continue PTA Plavix 75 mg daily, reduce Toprol XL 75 >>25 mg daily, continue PTA Crestor 10 mg daily; no chest pain at this time, may add Imdur if BP allows later   AKI on CKD IV  Pauci-immune  vasculitis with glomerulonephritis  - Cr 3.3 on 07/26/22, now 4.2 POA, reports low UOP, consider urine study and renal ultrasound and nephrology consult   PVCs/NSVT - continue suppressive therapy with amiodarone, TSH WNL 07/16/22, LFTs WNL 11/20/22  HTN - BP low normal currently, will add midodrine 5mg  BID for support, reduce Toprol XL 25mg  (stop if needed), diuresis as above, trend BP   BOOP Hx of diffuse B cell lymphoma of small bowel  Barrett's  esophagus/GERD HTN DM  BPH HLD - per primary team    Risk Assessment/Risk Scores:    TIMI Risk Score for Unstable Angina or Non-ST Elevation MI:   The patient's TIMI risk score is 4, which indicates a 20% risk of all cause mortality, new or recurrent myocardial infarction or need for urgent revascularization in the next 14 days.{  New York Heart Association (NYHA) Functional Class NYHA Class IV     For questions or updates, please contact [facility] Please consult [email or web address] for contact info under    Signed, Cyndi Bender, NP  11/21/2022 9:21 AM    Attending note:  Patient seen and examined.  I reviewed his records and discussed the case with Ms. Dion Body NP, I agree with her above findings.  Also spoke to Dr. Shirlee Latch.  Mr. Frechette presents with recent worsening shortness of breath, orthopnea and PND, cough over the last few days despite no change in his baseline cardiac regimen or fluid/sodium intake.  Has felt full in his chest with the symptoms but not necessarily anginal.  No fevers or chills.  Reports that his leg edema has been actually somewhat better over the last few weeks.  On examination he is in no acute distress.  Recent systolics running 80s to 90s however presenting in the low 100s.  He is in sinus rhythm by telemetry with heart rate in the 70s and afebrile.  Lungs exhibit decreased breath sounds at the bases with scattered mild crackles.  Cardiac exam with RRR, indistinct PMI and soft systolic murmur.  He does have  2+ peripheral edema.  Skin is warm and dry.  Pertinent lab work includes potassium 4.7, BUN 74, creatinine 4.2, normal LFTs, BNP 1145, high-sensitivity troponin I levels in the 600-700 range, hemoglobin 11.8, no leukocytosis, respiratory panel negative.  Chest x-ray reports cardiomegaly with interstitial edema and small pleural effusions.  No obvious infiltrates.  ECG shows sinus rhythm with prolonged PR interval and left bundle branch block.  Medically complex patient presenting with acute on chronic HFrEF, LVEF 35% as of March 2023, acute on chronic renal failure with CKD stage IV at baseline and present creatinine up to 4.2, known multivessel cardiovascular disease with high-sensitivity troponin I elevation suggesting demand ischemia.  GDMT has been limited as reviewed in Dr. Alford Highland notes.  Plan at this time is to transition to IV Lasix 80 mg twice daily for now, hold Jardiance, and reduce Toprol-XL to 25 mg daily.  Also plan to start midodrine 5 mg twice daily to support blood pressure during efforts at diuresis.  He is not a good candidate for IV inotropes or invasive cardiac evaluation in light of his multiple comorbidities.  Advanced heart failure team will continue to round and make further recommendations.  Jonelle Sidle, M.D., F.A.C.C.

## 2022-11-22 ENCOUNTER — Inpatient Hospital Stay (HOSPITAL_COMMUNITY): Payer: Medicare Other

## 2022-11-22 DIAGNOSIS — I130 Hypertensive heart and chronic kidney disease with heart failure and stage 1 through stage 4 chronic kidney disease, or unspecified chronic kidney disease: Secondary | ICD-10-CM

## 2022-11-22 DIAGNOSIS — I5021 Acute systolic (congestive) heart failure: Secondary | ICD-10-CM

## 2022-11-22 DIAGNOSIS — I5023 Acute on chronic systolic (congestive) heart failure: Secondary | ICD-10-CM | POA: Diagnosis not present

## 2022-11-22 DIAGNOSIS — Z7189 Other specified counseling: Secondary | ICD-10-CM

## 2022-11-22 LAB — ECHOCARDIOGRAM COMPLETE
AR max vel: 2.36 cm2
AV Peak grad: 3 mmHg
Ao pk vel: 0.87 m/s
Area-P 1/2: 4.06 cm2
Est EF: 20
Height: 73 in
MV M vel: 4.01 m/s
MV Peak grad: 64.3 mmHg
S' Lateral: 6.1 cm
Weight: 3348.8 oz

## 2022-11-22 LAB — BASIC METABOLIC PANEL
Anion gap: 14 (ref 5–15)
BUN: 76 mg/dL — ABNORMAL HIGH (ref 8–23)
CO2: 23 mmol/L (ref 22–32)
Calcium: 8.1 mg/dL — ABNORMAL LOW (ref 8.9–10.3)
Chloride: 99 mmol/L (ref 98–111)
Creatinine, Ser: 4.41 mg/dL — ABNORMAL HIGH (ref 0.61–1.24)
GFR, Estimated: 12 mL/min — ABNORMAL LOW (ref >60)
Glucose, Bld: 104 mg/dL — ABNORMAL HIGH (ref 70–99)
Potassium: 4 mmol/L (ref 3.5–5.1)
Sodium: 136 mmol/L (ref 135–145)

## 2022-11-22 LAB — CBC
HCT: 34 % — ABNORMAL LOW (ref 39.0–52.0)
Hemoglobin: 11.2 g/dL — ABNORMAL LOW (ref 13.0–17.0)
MCH: 36.1 pg — ABNORMAL HIGH (ref 26.0–34.0)
MCHC: 32.9 g/dL (ref 30.0–36.0)
MCV: 109.7 fL — ABNORMAL HIGH (ref 80.0–100.0)
Platelets: 143 10*3/uL — ABNORMAL LOW (ref 150–400)
RBC: 3.1 MIL/uL — ABNORMAL LOW (ref 4.22–5.81)
RDW: 16.6 % — ABNORMAL HIGH (ref 11.5–15.5)
WBC: 6 10*3/uL (ref 4.0–10.5)
nRBC: 0 % (ref 0.0–0.2)

## 2022-11-22 LAB — GLUCOSE, CAPILLARY
Glucose-Capillary: 97 mg/dL (ref 70–99)
Glucose-Capillary: 71 mg/dL (ref 70–99)
Glucose-Capillary: 154 mg/dL — ABNORMAL HIGH (ref 70–99)
Glucose-Capillary: 90 mg/dL (ref 70–99)
Glucose-Capillary: 141 mg/dL — ABNORMAL HIGH (ref 70–99)

## 2022-11-22 MED ORDER — MIDODRINE HCL 5 MG PO TABS
7.5000 mg | ORAL_TABLET | Freq: Three times a day (TID) | ORAL | Status: DC
  Administered 2022-11-22 – 2022-11-23 (×5): 7.5 mg via ORAL
  Filled 2022-11-22 (×5): qty 2

## 2022-11-22 MED ORDER — FUROSEMIDE 10 MG/ML IJ SOLN
120.0000 mg | Freq: Two times a day (BID) | INTRAVENOUS | Status: DC
  Administered 2022-11-22: 120 mg via INTRAVENOUS
  Filled 2022-11-22 (×2): qty 12
  Filled 2022-11-22: qty 10

## 2022-11-22 NOTE — Progress Notes (Signed)
Echocardiogram 2D Echocardiogram has been performed.  Lucendia Herrlich 11/22/2022, 2:34 PM

## 2022-11-22 NOTE — Progress Notes (Addendum)
Advanced Heart Failure Rounding Note  PCP-Cardiologist: Charlton Haws, MD   Subjective:   Only UOP documented but suspect it was more per patient report. Down 3lbs.   Feels better today, daughters at bedside, was able to rest last night despite getting up to use bathroom several times.   Objective:   Weight Range: 94.9 kg Body mass index is 27.61 kg/m.   Vital Signs:   Temp:  [97.5 F (36.4 C)-98.1 F (36.7 C)] 97.7 F (36.5 C) (07/08 0750) Pulse Rate:  [63-85] 85 (07/08 0750) Resp:  [15-18] 18 (07/08 0748) BP: (79-100)/(59-75) 88/62 (07/08 0750) SpO2:  [92 %-96 %] 92 % (07/08 0750) Weight:  [94.9 kg] 94.9 kg (07/08 0345)   Weight change: Filed Weights   11/20/22 1938 11/21/22 0641 11/22/22 0345  Weight: 90.7 kg 96.4 kg 94.9 kg   Intake/Output:   Intake/Output Summary (Last 24 hours) at 11/22/2022 0800 Last data filed at 11/22/2022 0615 Gross per 24 hour  Intake 440 ml  Output 400 ml  Net 40 ml    Physical Exam  General:  chronically ill appearing.  No respiratory difficulty HEENT: normal. +2L [place] Neck: supple. JVD ~10 cm. Carotids 2+ bilat; no bruits. No lymphadenopathy or thyromegaly appreciated. Cor: PMI nondisplaced. Regular rate & rhythm. No rubs, gallops or murmurs. Lungs: crackles at bases Abdomen: soft, nontender, nondistended. No hepatosplenomegaly. No bruits or masses. Good bowel sounds. Extremities: no cyanosis, clubbing, rash, +2 BLE edema. Cool extremeties  Neuro: alert & oriented x 3, cranial nerves grossly intact. moves all 4 extremities w/o difficulty. Affect pleasant.  Telemetry   NSR 80s  EKG    No new EKG to review  Labs    CBC Recent Labs    11/20/22 1941 11/22/22 0009  WBC 6.4 6.0  NEUTROABS 5.4  --   HGB 11.8* 11.2*  HCT 36.3* 34.0*  MCV 109.7* 109.7*  PLT 160 143*   Basic Metabolic Panel Recent Labs    16/10/96 1941 11/22/22 0009  NA 137 136  K 4.7 4.0  CL 97* 99  CO2 26 23  GLUCOSE 141* 104*  BUN 74* 76*   CREATININE 4.20* 4.41*  CALCIUM 9.2 8.1*   Liver Function Tests Recent Labs    11/20/22 1941  AST 22  ALT 23  ALKPHOS 49  BILITOT 0.8  PROT 7.1  ALBUMIN 4.1   No results for input(s): "LIPASE", "AMYLASE" in the last 72 hours. Cardiac Enzymes No results for input(s): "CKTOTAL", "CKMB", "CKMBINDEX", "TROPONINI" in the last 72 hours.  BNP: BNP (last 3 results) Recent Labs    04/05/22 1115 05/25/22 1752 11/20/22 1941  BNP 220.1* 134.6* 1,144.7*    ProBNP (last 3 results) No results for input(s): "PROBNP" in the last 8760 hours.   D-Dimer No results for input(s): "DDIMER" in the last 72 hours. Hemoglobin A1C No results for input(s): "HGBA1C" in the last 72 hours. Fasting Lipid Panel No results for input(s): "CHOL", "HDL", "LDLCALC", "TRIG", "CHOLHDL", "LDLDIRECT" in the last 72 hours. Thyroid Function Tests No results for input(s): "TSH", "T4TOTAL", "T3FREE", "THYROIDAB" in the last 72 hours.  Invalid input(s): "FREET3"  Other results:   Imaging    No results found.   Medications:     Scheduled Medications:  amiodarone  200 mg Oral Daily   azaTHIOprine  100 mg Oral Daily   clopidogrel  75 mg Oral Daily   dorzolamide-timolol  1 drop Both Eyes BID   enoxaparin (LOVENOX) injection  30 mg Subcutaneous Q24H  finasteride  5 mg Oral QHS   furosemide  80 mg Intravenous BID   insulin aspart  0-9 Units Subcutaneous TID WC   levothyroxine  100 mcg Oral Q0600   metoprolol succinate  25 mg Oral Daily   midodrine  5 mg Oral TID WC   multivitamin with minerals  1 tablet Oral Daily   pantoprazole  40 mg Oral Daily   rosuvastatin  10 mg Oral Daily   tamsulosin  0.4 mg Oral QHS    Infusions:   PRN Medications: acetaminophen **OR** acetaminophen, nitroGLYCERIN, ondansetron **OR** ondansetron (ZOFRAN) IV, mouth rinse  Patient Profile   Luke Mcbride is a 87 y.o. male with history of CAD, ischemic cardiomyopathy, BOOP, pauci-immune vasculitis with CKD stage  4, and diffuse large B cell lymphoma. AHF team to see for CHF exacerbation.   Assessment/Plan  1. Acute on chronic systolic CHF: Ischemic cardiomyopathy.  Last echo 3/23 with EF 35%, mildly decreased RV systolic function.  CHF has been complicated by CKD stage 4 which limits GDMT. He was admitted with worsened creatinine to 4.2 and NYHA class IIIb symptoms.  He is volume overloaded on exam.  SBP soft, running 80s-100s.  Lactate normal at 1.9. - Lasix increased today 80>120 mg BID per nephrology. ReDS clip 51%, suspect falsely elevated with lung disease. Weight down 3lbs, only documented.  - Increase midodrine 5>7.5  mg tid.   - Hold Jardiance 10 mg daily per nephrology recs. - Stop Toprol XL 25mg  daily per nephrology recs - He did not tolerate Verquvo (no specific complaint, "did not feel well on it.") - No ARB/ARNI/spironolactone with CKD stage 4.   - echo ordered.  - consider PICC for co-ox / CVP - place UNNA boots - Not ICD candidate with age. Has new LBBB with QRS 142, doubt he would benefit significantly from CRT at this point.  - Not candidate for advanced therapies with age and fragility.  - order placed for strict I&O and daily weights 2. CAD: S/p CABG 1/02 with subsequent PCI to seq SVG-PDA/PLV and later to native LAD.  He has rare atypical chest pain.  HS-TnI mildly elevated with no trend (774 => 680).  Suspect demand ischemia with volume overload +AKI.    - Continue Plavix 75 daily.  - Continue Crestor 10 mg daily.  3. AKI on CKD stage 4: From pauci-immune vasculitis with glomerulonephritis. Creatinine up to 4.4 today in setting of volume overload.  - Will try to diurese as above. May need inotropic support.  - Think dialysis would be difficult for him with age, low BP, and poor functional status.  - Dr. Arrie Aran following 4. PVCs: Zio in 7/23 with 11% PVCs and 6 NSVT runs.  - Continue amiodarone 200 mg daily.   5. PAD: 3/24 evaluation with abnormal TBIs, normal ABIs.  6.  GOC - limited options at this point. Suspect he would not tolerate HD. Per nephrology and patient no plants for RRT.  - consider palliative care consult   Length of Stay: 1  Alen Bleacher, NP  11/22/2022, 8:00 AM  Advanced Heart Failure Team Pager [phone] (M-F; 7a - 5p)  Please contact [facility] for night-coverage after hours (5p -7a ) and weekends on [email or web address]

## 2022-11-22 NOTE — Progress Notes (Signed)
REDS Clip  READING= 51%  Thank you for allowing pharmacy to participate in this patient's care,  Sherron Monday, PharmD, BCCCP Clinical Pharmacist  Phone: [phone] 11/22/2022 8:54 AM  Please check AMION for all [facility] phone numbers After 10:00 PM, call Main Pharmacy [phone]

## 2022-11-22 NOTE — Progress Notes (Signed)
[facility] Progress Note   Subjective:   Seen in room with 2 daughters present.  Orthopnea continues, poor sleep up in recliner.   Eating little.  No confusion.   I/Os yest 0.44 / 0.4L.   Objective Vitals:   11/21/22 2346 11/22/22 0345 11/22/22 0748 11/22/22 0750  BP: 100/72 97/60 (!) 79/59 (!) 88/62  Pulse: 72 63 82 85  Resp: 15 17 18    Temp: 98.1 F (36.7 C) 97.6 F (36.4 C) 97.7 F (36.5 C) 97.7 F (36.5 C)  TempSrc: Oral Oral Oral Oral  SpO2: 94% 96% 96% 92%  Weight:  94.9 kg    Height:       Physical Exam General: chronically ill appearing, in no distress Heart: HR and BP noted, no rub on exam Lungs: normal WOB with conversation, dec BS bases Abdomen: soft Extremities: 2+ ankle and pretibial edema Neuro: awake and alert conversant  Additional Objective Labs: Basic Metabolic Panel: Recent Labs  Lab 11/20/22 1941 11/22/22 0009  NA 137 136  K 4.7 4.0  CL 97* 99  CO2 26 23  GLUCOSE 141* 104*  BUN 74* 76*  CREATININE 4.20* 4.41*  CALCIUM 9.2 8.1*   Liver Function Tests: Recent Labs  Lab 11/20/22 1941  AST 22  ALT 23  ALKPHOS 49  BILITOT 0.8  PROT 7.1  ALBUMIN 4.1   No results for input(s): "LIPASE", "AMYLASE" in the last 168 hours. CBC: Recent Labs  Lab 11/20/22 1941 11/22/22 0009  WBC 6.4 6.0  NEUTROABS 5.4  --   HGB 11.8* 11.2*  HCT 36.3* 34.0*  MCV 109.7* 109.7*  PLT 160 143*   Blood Culture    Component Value Date/Time   SDES URINE, RANDOM 09/01/2016 2314   SPECREQUEST NONE 09/01/2016 2314   CULT MULTIPLE SPECIES PRESENT, SUGGEST RECOLLECTION (A) 09/01/2016 2314   REPTSTATUS 09/03/2016 FINAL 09/01/2016 2314    Cardiac Enzymes: No results for input(s): "CKTOTAL", "CKMB", "CKMBINDEX", "TROPONINI" in the last 168 hours. CBG: Recent Labs  Lab 11/21/22 1203 11/21/22 1702 11/21/22 2132 11/22/22 0602  GLUCAP 107* 129* 113* 71   Iron Studies: No results for input(s): "IRON", "TIBC", "TRANSFERRIN", "FERRITIN" in  the last 72 hours. @lablastinr3 @ Studies/Results: DG Chest Portable 1 View  Result Date: 11/20/2022 CLINICAL DATA:  Cardiomegaly. EXAM: PORTABLE CHEST 1 VIEW COMPARISON:  05/25/2022 FINDINGS: Cardiomegaly status post median sternotomy and CABG. Mild diffuse interstitial opacity. Small, layering pleural effusions. The visualized skeletal structures are unremarkable. IMPRESSION: Cardiomegaly with mild diffuse interstitial opacity and small, layering pleural effusions, consistent with edema. No focal airspace opacity. Electronically Signed   By: Jearld Lesch M.D.   On: 11/20/2022 20:50   Medications:   amiodarone  200 mg Oral Daily   azaTHIOprine  100 mg Oral Daily   clopidogrel  75 mg Oral Daily   dorzolamide-timolol  1 drop Both Eyes BID   enoxaparin (LOVENOX) injection  30 mg Subcutaneous Q24H   finasteride  5 mg Oral QHS   furosemide  80 mg Intravenous BID   insulin aspart  0-9 Units Subcutaneous TID WC   levothyroxine  100 mcg Oral Q0600   metoprolol succinate  25 mg Oral Daily   midodrine  5 mg Oral TID WC   multivitamin with minerals  1 tablet Oral Daily   pantoprazole  40 mg Oral Daily   rosuvastatin  10 mg Oral Daily   tamsulosin  0.4 mg Oral QHS     Assessment/Plan:  AKI/CKD stage IV - in setting of  acute on chronic systolic CHF c/w cardiorenal syndrome.  Agree with IV lasix (inc dose today) and midodrine.  Dr. Arrie Aran (his primary neph) has discussed RRT and he and his family are in agreement not to pursue any form of dialysis - confirmed 7/8.  Will need to manage this medically and agree with Palliative care consultation today.  Avoid nephrotoxic medications including NSAIDs and iodinated intravenous contrast exposure unless the latter is absolutely indicated.  Preferred narcotic agents for pain control are hydromorphone, fentanyl, and methadone. Morphine should not be used. Avoid Baclofen and avoid oral sodium phosphate and magnesium citrate based laxatives / bowel preps.  Continue strict Input and Output monitoring. Will monitor the patient closely with you and intervene or adjust therapy as indicated by changes in clinical status/labs  Acute on chronic systolic CHF/ICMP - seen by cardiology and AHF team.  Remains overloaded - lasix 80 IV given ~6a, poor response so redose 120 IV now and cont BID. Hypotension - on midodrine.  F/b CHF team.  Pauci-immune vasculitis - continue with imuran for now and follow. CAD - elevated troponin but improving.  Cardiology following. Disposition - poor overall prognosis.  He has had progressive CKD to stage IV since developing cardiorenal syndrome.  Dr. Arrie Aran discussed RRT and are both in agreement not to pursue any form of RRT given his advanced age, multiple co-morbidities, and his poor functional and nutritional status.  Nor would he tolerate it well given his declining cardiac function.   Would recommend palliative care consult to help set goals/limits of care.    Estill Bakes MD 11/22/2022, 7:59 AM  [facility] Pager: [phone]

## 2022-11-22 NOTE — Progress Notes (Signed)
Mobility Specialist Progress Note:    11/22/22 1625  Mobility  Activity Refused mobility   Pt refused mobility d/t fatigue. Will f/u as able.    Leory Plowman  Mobility Specialist Please contact via [employer] office at [phone]

## 2022-11-22 NOTE — Progress Notes (Signed)
Orthopedic Tech Progress Note Patient Details:  NICKLUS DOCKENDORF 1935-11-15 [id number]  Applied with daughter at bedside   Ortho Devices Type of Ortho Device: Ace wrap, Unna boot Ortho Device/Splint Location: BLE Ortho Device/Splint Interventions: Ordered, Application   Post Interventions Patient Tolerated: Well Instructions Provided: Care of device  Donald Pore 11/22/2022, 5:14 PM

## 2022-11-22 NOTE — Progress Notes (Signed)
PROGRESS NOTE    LESLIE DESCHENES  ZOX:[id number] DOB: 1936/02/17 DOA: 11/20/2022 PCP: Patient, No Pcp Per   Brief Narrative:    KEIGHAN SCHOENBECK is a 87 y.o. male with medical history significant of coronary artery disease, heart failure, CKD, hypertension, hyperlipidemia, ILD and history of vasculitis who presented with dyspnea.  He reports being at his baseline until 07/04, when he started to experience dyspnea on exertion.  He has been admitted with acute on chronic systolic CHF exacerbation in the setting of CKD stage IV.  Heart failure team as well as nephrology recommending palliative evaluation due to poor prognosis.  He continues to be aggressively diuresed per nephrology/heart failure team.  Assessment & Plan:   Principal Problem:   Acute on chronic systolic CHF (congestive heart failure) (HCC) Active Problems:   CAD (coronary artery disease)   CKD (chronic kidney disease) stage 4, GFR 15-29 ml/min (HCC)   ILD (interstitial lung disease) (HCC)   Hypothyroidism   Type 2 diabetes mellitus with hyperlipidemia (HCC)   Vasculitis (HCC)  Assessment and Plan:  Acute on chronic systolic CHF (congestive heart failure) (HCC) Echocardiogram with reduced LV systolic function to 35%. Global hypokinesis, mild LVH. RV systolic function with mild reduction, LA and RA with mild dilatation, no significant valvular disease.    Plan to continue diuresis with furosemide 120 mg IV q12 hrs. Midodrine for blood pressure support.  Increased per cardiology to 7.5 mg 3 times daily Holding Jardiance and Toprol-XL Echo ordered and pending   Patient with history of non sustained VT and frequent PVC. Continue with amiodarone 200  mg daily.  Currently not a candidate for ICD per cardiology   CAD (coronary artery disease) No signs of acute coronary syndrome.  Plan to continue clopidogrel and statin.    CKD (chronic kidney disease) stage 4, GFR 15-29 ml/min (HCC) Reduced GFR with hypervolemia.    Plan to continue diuresis with loop diuretics. Follow up renal function in am, avoid hypotension or nephrotoxic medications.  Not a candidate for RRT per nephrology   ILD (interstitial lung disease) (HCC) Continue with supplemental 02 per . No clinical signs of exacerbation.    Hypothyroidism Continue with levothyroxine.    Type 2 diabetes mellitus with hyperlipidemia (HCC) Add insulin sliding scale for glucose cover and monitoring.  Continue with statin therapy.   Vasculitis (HCC) Continue with azathioprine.  No signs of acute exacerbation.      DVT prophylaxis: Lovenox Code Status: Full Family Communication: Daughter at bedside 7/8 Disposition Plan: Continue ongoing diuresis/palliative consultation Status is: Inpatient Remains inpatient appropriate because: Need for IV medications.  Consultants:  Nephrology Cardiology Palliative care  Procedures:  None  Antimicrobials:  None   Subjective: Patient seen and evaluated today with no new acute complaints or concerns. No acute concerns or events noted overnight.  Currently using restroom and daughter present in room and is primary historian.  Not much urine output recorded overnight.  Objective: Vitals:   11/21/22 1900 11/21/22 1936 11/21/22 2346 11/22/22 0345  BP: 96/75 99/74 100/72 97/60  Pulse: 76 74 72 63  Resp:  18 15 17   Temp:  97.9 F (36.6 C) 98.1 F (36.7 C) 97.6 F (36.4 C)  TempSrc:  Oral Oral Oral  SpO2: 95% 96% 94% 96%  Weight:    94.9 kg  Height:        Intake/Output Summary (Last 24 hours) at 11/22/2022 8119 Last data filed at 11/22/2022 0615 Gross per 24 hour  Intake 440  ml  Output 450 ml  Net -10 ml   Filed Weights   11/20/22 1938 11/21/22 0641 11/22/22 0345  Weight: 90.7 kg 96.4 kg 94.9 kg    Examination:  General exam: Appears calm and comfortable  Respiratory system: Clear to auscultation. Respiratory effort normal. Cardiovascular system: S1 & S2 heard, RRR.  Gastrointestinal  system: Abdomen is soft Central nervous system: Alert and awake Extremities: No edema Skin: No significant lesions noted Psychiatry: Flat affect.    Data Reviewed: I have personally reviewed following labs and imaging studies  CBC: Recent Labs  Lab 11/20/22 1941 11/22/22 0009  WBC 6.4 6.0  NEUTROABS 5.4  --   HGB 11.8* 11.2*  HCT 36.3* 34.0*  MCV 109.7* 109.7*  PLT 160 143*   Basic Metabolic Panel: Recent Labs  Lab 11/20/22 1941 11/22/22 0009  NA 137 136  K 4.7 4.0  CL 97* 99  CO2 26 23  GLUCOSE 141* 104*  BUN 74* 76*  CREATININE 4.20* 4.41*  CALCIUM 9.2 8.1*   GFR: Estimated Creatinine Clearance: 13.8 mL/min (A) (by C-G formula based on SCr of 4.41 mg/dL (H)). Liver Function Tests: Recent Labs  Lab 11/20/22 1941  AST 22  ALT 23  ALKPHOS 49  BILITOT 0.8  PROT 7.1  ALBUMIN 4.1   No results for input(s): "LIPASE", "AMYLASE" in the last 168 hours. No results for input(s): "AMMONIA" in the last 168 hours. Coagulation Profile: No results for input(s): "INR", "PROTIME" in the last 168 hours. Cardiac Enzymes: No results for input(s): "CKTOTAL", "CKMB", "CKMBINDEX", "TROPONINI" in the last 168 hours. BNP (last 3 results) No results for input(s): "PROBNP" in the last 8760 hours. HbA1C: No results for input(s): "HGBA1C" in the last 72 hours. CBG: Recent Labs  Lab 11/21/22 1203 11/21/22 1702 11/21/22 2132 11/22/22 0602  GLUCAP 107* 129* 113* 71   Lipid Profile: No results for input(s): "CHOL", "HDL", "LDLCALC", "TRIG", "CHOLHDL", "LDLDIRECT" in the last 72 hours. Thyroid Function Tests: No results for input(s): "TSH", "T4TOTAL", "FREET4", "T3FREE", "THYROIDAB" in the last 72 hours. Anemia Panel: No results for input(s): "VITAMINB12", "FOLATE", "FERRITIN", "TIBC", "IRON", "RETICCTPCT" in the last 72 hours. Sepsis Labs: Recent Labs  Lab 11/21/22 1018  LATICACIDVEN 1.9    Recent Results (from the past 240 hour(s))  Resp panel by RT-PCR (RSV, Flu  A&B, Covid) Anterior Nasal Swab     Status: None   Collection Time: 11/20/22  7:41 PM   Specimen: Anterior Nasal Swab  Result Value Ref Range Status   SARS Coronavirus 2 by RT PCR NEGATIVE NEGATIVE Final    Comment: (NOTE) SARS-CoV-2 target nucleic acids are NOT DETECTED.  The SARS-CoV-2 RNA is generally detectable in upper respiratory specimens during the acute phase of infection. The lowest concentration of SARS-CoV-2 viral copies this assay can detect is 138 copies/mL. A negative result does not preclude SARS-Cov-2 infection and should not be used as the sole basis for treatment or other patient management decisions. A negative result may occur with  improper specimen collection/handling, submission of specimen other than nasopharyngeal swab, presence of viral mutation(s) within the areas targeted by this assay, and inadequate number of viral copies(<138 copies/mL). A negative result must be combined with clinical observations, patient history, and epidemiological information. The expected result is Negative.  Fact Sheet for Patients:  BloggerCourse.com  Fact Sheet for Healthcare Providers:  SeriousBroker.it  This test is no t yet approved or cleared by the [place] FDA and  has been authorized for detection and/or diagnosis  of SARS-CoV-2 by FDA under an Emergency Use Authorization (EUA). This EUA will remain  in effect (meaning this test can be used) for the duration of the COVID-19 declaration under Section 564(b)(1) of the Act, 21 U.S.C.section 360bbb-3(b)(1), unless the authorization is terminated  or revoked sooner.       Influenza A by PCR NEGATIVE NEGATIVE Final   Influenza B by PCR NEGATIVE NEGATIVE Final    Comment: (NOTE) The Xpert Xpress SARS-CoV-2/FLU/RSV plus assay is intended as an aid in the diagnosis of influenza from Nasopharyngeal swab specimens and should not be used as a sole basis for treatment.  Nasal washings and aspirates are unacceptable for Xpert Xpress SARS-CoV-2/FLU/RSV testing.  Fact Sheet for Patients: BloggerCourse.com  Fact Sheet for Healthcare Providers: SeriousBroker.it  This test is not yet approved or cleared by the [place] FDA and has been authorized for detection and/or diagnosis of SARS-CoV-2 by FDA under an Emergency Use Authorization (EUA). This EUA will remain in effect (meaning this test can be used) for the duration of the COVID-19 declaration under Section 564(b)(1) of the Act, 21 U.S.C. section 360bbb-3(b)(1), unless the authorization is terminated or revoked.     Resp Syncytial Virus by PCR NEGATIVE NEGATIVE Final    Comment: (NOTE) Fact Sheet for Patients: BloggerCourse.com  Fact Sheet for Healthcare Providers: SeriousBroker.it  This test is not yet approved or cleared by the [place] FDA and has been authorized for detection and/or diagnosis of SARS-CoV-2 by FDA under an Emergency Use Authorization (EUA). This EUA will remain in effect (meaning this test can be used) for the duration of the COVID-19 declaration under Section 564(b)(1) of the Act, 21 U.S.C. section 360bbb-3(b)(1), unless the authorization is terminated or revoked.  Performed at [facility], [address] [place], [place] [address]   MRSA Next Gen by PCR, Nasal     Status: None   Collection Time: 11/21/22  7:00 AM   Specimen: Nasal Mucosa; Nasal Swab  Result Value Ref Range Status   MRSA by PCR Next Gen NOT DETECTED NOT DETECTED Final    Comment: (NOTE) The GeneXpert MRSA Assay (FDA approved for NASAL specimens only), is one component of a comprehensive MRSA colonization surveillance program. It is not intended to diagnose MRSA infection nor to guide or monitor treatment for MRSA infections. Test performance is not FDA approved in patients  less than 28 years old. Performed at [facility], [address], [place], [place] [address]          Radiology Studies: DG Chest Portable 1 View  Result Date: 11/20/2022 CLINICAL DATA:  Cardiomegaly. EXAM: PORTABLE CHEST 1 VIEW COMPARISON:  05/25/2022 FINDINGS: Cardiomegaly status post median sternotomy and CABG. Mild diffuse interstitial opacity. Small, layering pleural effusions. The visualized skeletal structures are unremarkable. IMPRESSION: Cardiomegaly with mild diffuse interstitial opacity and small, layering pleural effusions, consistent with edema. No focal airspace opacity. Electronically Signed   By: Jearld Lesch M.D.   On: 11/20/2022 20:50        Scheduled Meds:  amiodarone  200 mg Oral Daily   azaTHIOprine  100 mg Oral Daily   clopidogrel  75 mg Oral Daily   dorzolamide-timolol  1 drop Both Eyes BID   enoxaparin (LOVENOX) injection  30 mg Subcutaneous Q24H   finasteride  5 mg Oral QHS   furosemide  80 mg Intravenous BID   insulin aspart  0-9 Units Subcutaneous TID WC   levothyroxine  100 mcg Oral Q0600   metoprolol  succinate  25 mg Oral Daily   midodrine  5 mg Oral TID WC   multivitamin with minerals  1 tablet Oral Daily   pantoprazole  40 mg Oral Daily   rosuvastatin  10 mg Oral Daily   tamsulosin  0.4 mg Oral QHS     LOS: 1 day    Time spent: 35 minutes    Myrl Lazarus Hoover Brunette, DO [facility]  If 7PM-7AM, please contact night-coverage [email or web address] 11/22/2022, 6:52 AM

## 2022-11-22 NOTE — Consult Note (Signed)
Consultation Note Date: 11/22/2022   Patient Name: Luke Mcbride  DOB: 1935/09/20  MRN: [id number]  Age / Sex: 87 y.o., male  PCP: Patient, No Pcp Per Referring Physician: Erick Blinks, DO  Reason for Consultation:  goals of care; advancing CKD/HF and not candidate for RRT and ICD. Poor long term prognosis  HPI/Patient Profile: 87 y.o. male  with past medical history of CAD, heart failure, HTN, HLD, ILD, vasculitis admitted on 11/20/2022 with shortness of breath. Admitted and being treated for acute on chronic heart failure exacerbation (diuresing), acute on chronic kidney injury (Cr trending up- 4.41 today)- not a candidate for RRT, cardiorenal syndrome. Palliative medicine consulted for GOC.  Primary Decision Maker PATIENT - his daughter Luke Mcbride is HCPOA  Discussion: I have reviewed medical records including Care Everywhere, progress notes from this and prior admissions, labs and imaging, discussed with RN.  Met in patient's room with patient and daughter- Luke Mcbride.   On evaluation patient is awake, alert, difficult to engage in discussion.  I introduced Palliative Medicine as specialized medical care for people living with serious illness. It focuses on providing relief from the symptoms and stress of a serious illness. The goal is to improve quality of life for both the patient and the family.  We discussed a brief life review of the patient. He is from [place]. 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 Married- has 3 children.    We discussed patient's current illness and what it means in the larger context of patient's on-going co-morbidities.  Natural disease trajectory and expectations at EOL were discussed.  I attempted to elicit values and goals of care important to the patient.  He worries about dying and leaving his spouse. She has likely dementia. Family  is working on a plan to ensure she is cared for.   The difference between aggressive medical intervention and comfort care was discussed.   Advance directives and concepts specific to code status were discussed. Encouraged patient/family to consider DNR/DNI status understanding evidenced based poor outcomes in similar hospitalized patients, as the cause of the arrest is likely associated with chronic/terminal disease rather than a reversible acute cardio-pulmonary event. Patient and daughter agreed with DNR status.   Hospice philosophy and services were discussed. Hard choices book was provided.  Questions and concerns were addressed. The family was encouraged to call with questions or concerns.   SUMMARY OF RECOMMENDATIONS -Acute on chronic heart failure exacerbation complicated by acute on chronic renal failure - cardiorenal syndrome- GOC determined to be to maximize this hospitalization and discharge home with hospice services -Code status- changed to DNR    Code Status/Advance Care Planning: DNR   Prognosis:   < 6 weeks likely if renal function fails to improve  Discharge Planning: Home with Hospice  Primary Diagnoses: Present on Admission:  Hypothyroidism  CKD (chronic kidney disease) stage 4, GFR 15-29 ml/min (HCC)  CAD (coronary artery disease)    Physical Exam Vitals and nursing note reviewed.  Constitutional:  General: He is not in acute distress.    Appearance: He is ill-appearing.  Cardiovascular:     Rate and Rhythm: Normal rate.  Pulmonary:     Effort: Pulmonary effort is normal.  Neurological:     Mental Status: He is alert.     Vital Signs: BP (!) 88/62 (BP Location: Right Arm)   Pulse 85   Temp 97.7 F (36.5 C) (Oral)   Resp 18   Ht 6\' 1"  (1.854 m)   Wt 94.9 kg   SpO2 92%   BMI 27.61 kg/m  Pain Scale: 0-10   Pain Score: 0-No pain   SpO2: SpO2: 92 % O2 Device:SpO2: 92 % O2 Flow Rate: .O2 Flow Rate (L/min): 3 L/min  IO: Intake/output  summary:  Intake/Output Summary (Last 24 hours) at 11/22/2022 1041 Last data filed at 11/22/2022 0800 Gross per 24 hour  Intake 480 ml  Output 400 ml  Net 80 ml    LBM:   Baseline Weight: Weight: 90.7 kg Most recent weight: Weight: 94.9 kg       Thank you for this consult. Palliative medicine will continue to follow and assist as needed.   Greater than 50%  of this time was spent counseling and coordinating care related to the above assessment and plan.  Signed by: Ocie Bob, AGNP-C Palliative Medicine    Please contact Palliative Medicine Team phone at [phone] for questions and concerns.  For individual provider: See Loretha Stapler

## 2022-11-22 NOTE — Progress Notes (Signed)
Ortho Tech called about new order for [employer].

## 2022-11-23 ENCOUNTER — Inpatient Hospital Stay (HOSPITAL_COMMUNITY): Payer: Medicare Other

## 2022-11-23 DIAGNOSIS — J849 Interstitial pulmonary disease, unspecified: Secondary | ICD-10-CM | POA: Diagnosis not present

## 2022-11-23 DIAGNOSIS — Z7189 Other specified counseling: Secondary | ICD-10-CM | POA: Diagnosis not present

## 2022-11-23 DIAGNOSIS — R0602 Shortness of breath: Secondary | ICD-10-CM

## 2022-11-23 DIAGNOSIS — I5023 Acute on chronic systolic (congestive) heart failure: Secondary | ICD-10-CM | POA: Diagnosis not present

## 2022-11-23 LAB — BASIC METABOLIC PANEL
Anion gap: 13 (ref 5–15)
BUN: 89 mg/dL — ABNORMAL HIGH (ref 8–23)
CO2: 24 mmol/L (ref 22–32)
Calcium: 8.4 mg/dL — ABNORMAL LOW (ref 8.9–10.3)
Chloride: 98 mmol/L (ref 98–111)
Creatinine, Ser: 4.78 mg/dL — ABNORMAL HIGH (ref 0.61–1.24)
GFR, Estimated: 11 mL/min — ABNORMAL LOW (ref >60)
Glucose, Bld: 165 mg/dL — ABNORMAL HIGH (ref 70–99)
Potassium: 4.1 mmol/L (ref 3.5–5.1)
Sodium: 135 mmol/L (ref 135–145)

## 2022-11-23 LAB — GLUCOSE, CAPILLARY
Glucose-Capillary: 120 mg/dL — ABNORMAL HIGH (ref 70–99)
Glucose-Capillary: 147 mg/dL — ABNORMAL HIGH (ref 70–99)
Glucose-Capillary: 166 mg/dL — ABNORMAL HIGH (ref 70–99)

## 2022-11-23 LAB — MAGNESIUM: Magnesium: 2.8 mg/dL — ABNORMAL HIGH (ref 1.7–2.4)

## 2022-11-23 LAB — HEMOGLOBIN A1C
Hgb A1c MFr Bld: 5.7 % — ABNORMAL HIGH (ref 4.8–5.6)
Mean Plasma Glucose: 117 mg/dL

## 2022-11-23 LAB — CBC
HCT: 33.7 % — ABNORMAL LOW (ref 39.0–52.0)
Hemoglobin: 10.9 g/dL — ABNORMAL LOW (ref 13.0–17.0)
MCH: 36 pg — ABNORMAL HIGH (ref 26.0–34.0)
MCHC: 32.3 g/dL (ref 30.0–36.0)
MCV: 111.2 fL — ABNORMAL HIGH (ref 80.0–100.0)
Platelets: 142 10*3/uL — ABNORMAL LOW (ref 150–400)
RBC: 3.03 MIL/uL — ABNORMAL LOW (ref 4.22–5.81)
RDW: 16.4 % — ABNORMAL HIGH (ref 11.5–15.5)
WBC: 6.1 10*3/uL (ref 4.0–10.5)
nRBC: 0.3 % — ABNORMAL HIGH (ref 0.0–0.2)

## 2022-11-23 MED ORDER — LORAZEPAM 2 MG/ML PO CONC: 0.6000 mg | ORAL | 0 refills | Status: DC | PRN

## 2022-11-23 MED ORDER — FUROSEMIDE 10 MG/ML IJ SOLN
160.0000 mg | Freq: Two times a day (BID) | INTRAVENOUS | Status: DC
  Filled 2022-11-23: qty 16

## 2022-11-23 MED ORDER — LORAZEPAM 2 MG/ML PO CONC: 2.0000 mg | ORAL | Status: DC | PRN

## 2022-11-23 MED ORDER — MORPHINE SULFATE 20 MG/5ML PO SOLN: 2.5000 mg | ORAL | 0 refills | Status: DC | PRN

## 2022-11-23 MED ORDER — LORAZEPAM 1 MG PO TABS: 2.0000 mg | ORAL_TABLET | ORAL | Status: DC | PRN

## 2022-11-23 MED ORDER — OXYCODONE HCL 5 MG PO TABS: 5.0000 mg | ORAL_TABLET | ORAL | Status: DC | PRN

## 2022-11-23 MED ORDER — FUROSEMIDE 10 MG/ML IJ SOLN
160.0000 mg | Freq: Two times a day (BID) | INTRAVENOUS | Status: DC
  Administered 2022-11-23: 160 mg via INTRAVENOUS
  Filled 2022-11-23 (×2): qty 16
  Filled 2022-11-23: qty 10

## 2022-11-23 MED ORDER — MELATONIN 3 MG PO TABS
3.0000 mg | ORAL_TABLET | Freq: Every evening | ORAL | Status: DC | PRN
  Administered 2022-11-23: 3 mg via ORAL
  Filled 2022-11-23: qty 1

## 2022-11-23 MED ORDER — METOLAZONE 2.5 MG PO TABS
10.0000 mg | ORAL_TABLET | Freq: Once | ORAL | Status: AC
  Administered 2022-11-23: 10 mg via ORAL
  Filled 2022-11-23: qty 4

## 2022-11-23 MED ORDER — SENNA 8.6 MG PO TABS: 2.0000 | ORAL_TABLET | Freq: Every day | ORAL | Status: DC

## 2022-11-23 MED ORDER — OXYCODONE HCL 20 MG/ML PO CONC: 6.0000 mg | ORAL | 0 refills | Status: DC | PRN

## 2022-11-23 MED ORDER — SENNA 8.6 MG PO TABS: 2.0000 | ORAL_TABLET | Freq: Every day | ORAL | 0 refills | Status: DC

## 2022-11-23 MED ORDER — OXYCODONE HCL 20 MG/ML PO CONC: 5.0000 mg | ORAL | Status: DC | PRN

## 2022-11-23 NOTE — Progress Notes (Signed)
[facility] Progress Note   Subjective:   Had a fall yesterday and injured shoulder, X ray being done now.  Cont to weaken.  Met with palliative - discharge w hospice ASAP.   Objective Vitals:   11/22/22 2030 11/22/22 2300 11/23/22 0500 11/23/22 0713  BP: 98/75 97/67  (!) 89/66  Pulse: 79 76 65 76  Resp: 15 16    Temp: 97.8 F (36.6 C) 97.6 F (36.4 C)  98.4 F (36.9 C)  TempSrc: Oral Oral  Oral  SpO2: 98% 96% 95%   Weight:   92.3 kg   Height:       Physical Exam General: chronically ill appearing, in no distress Heart: HR and BP noted Lungs: normal WOB  Abdomen: soft Extremities: 2+ ankle and pretibial edema Neuro: awake and alert in chair  Additional Objective Labs: Basic Metabolic Panel: Recent Labs  Lab 11/20/22 1941 11/22/22 0009 11/23/22 0010  NA 137 136 135  K 4.7 4.0 4.1  CL 97* 99 98  CO2 26 23 24   GLUCOSE 141* 104* 165*  BUN 74* 76* 89*  CREATININE 4.20* 4.41* 4.78*  CALCIUM 9.2 8.1* 8.4*    Liver Function Tests: Recent Labs  Lab 11/20/22 1941  AST 22  ALT 23  ALKPHOS 49  BILITOT 0.8  PROT 7.1  ALBUMIN 4.1    No results for input(s): "LIPASE", "AMYLASE" in the last 168 hours. CBC: Recent Labs  Lab 11/20/22 1941 11/22/22 0009 11/23/22 0010  WBC 6.4 6.0 6.1  NEUTROABS 5.4  --   --   HGB 11.8* 11.2* 10.9*  HCT 36.3* 34.0* 33.7*  MCV 109.7* 109.7* 111.2*  PLT 160 143* 142*    Blood Culture    Component Value Date/Time   SDES URINE, RANDOM 09/01/2016 2314   SPECREQUEST NONE 09/01/2016 2314   CULT MULTIPLE SPECIES PRESENT, SUGGEST RECOLLECTION (A) 09/01/2016 2314   REPTSTATUS 09/03/2016 FINAL 09/01/2016 2314    Cardiac Enzymes: No results for input(s): "CKTOTAL", "CKMB", "CKMBINDEX", "TROPONINI" in the last 168 hours. CBG: Recent Labs  Lab 11/22/22 0602 11/22/22 1114 11/22/22 1605 11/22/22 2132 11/23/22 0603  GLUCAP 71 154* 90 141* 120*    Iron Studies: No results for input(s): "IRON", "TIBC",  "TRANSFERRIN", "FERRITIN" in the last 72 hours. @lablastinr3 @ Studies/Results: ECHOCARDIOGRAM COMPLETE  Result Date: 11/22/2022    ECHOCARDIOGRAM REPORT   Patient Name:   Luke Mcbride Date of Exam: 11/22/2022 Medical Rec #:  [id number]       Height:       73.0 in Accession #:    [id number]      Weight:       209.3 lb Date of Birth:  1935-07-24        BSA:          2.194 m Patient Age:    87 years        BP:           93/65 mmHg Patient Gender: M               HR:           79 bpm. Exam Location:  Inpatient Procedure: 2D Echo, Cardiac Doppler and Color Doppler Indications:    CHF-Acute Systolic I50.21  History:        Patient has prior history of Echocardiogram examinations, most                 recent 07/24/2021. CHF, Previous Myocardial Infarction, Angina  and CAD, Signs/Symptoms:Dyspnea; Risk Factors:Hypertension,                 Diabetes and Dyslipidemia. CKD, stage 4.  Sonographer:    Lucendia Herrlich Referring Phys: [id number] Cyndi Bender IMPRESSIONS  1. Left ventricular ejection fraction, by estimation, is 20%. The left ventricle has severely decreased function. The left ventricle demonstrates global hypokinesis. The left ventricular internal cavity size was moderately dilated. Left ventricular diastolic parameters are consistent with Grade II diastolic dysfunction (pseudonormalization).  2. Right ventricular systolic function is moderately reduced. The right ventricular size is moderately enlarged. There is moderately elevated pulmonary artery systolic pressure. The estimated right ventricular systolic pressure is 55.4 mmHg.  3. Left atrial size was moderately dilated.  4. Right atrial size was moderately dilated.  5. The mitral valve is abnormal. Severe central mitral valve regurgitation, likely functional MR with dilated LV and LA. No evidence of mitral stenosis.  6. Tricuspid valve regurgitation is mild to moderate.  7. The aortic valve is tricuspid. There is mild calcification of the aortic  valve. Aortic valve regurgitation is not visualized. No aortic stenosis is present.  8. The inferior vena cava is dilated in size with <50% respiratory variability, suggesting right atrial pressure of 15 mmHg. FINDINGS  Left Ventricle: Left ventricular ejection fraction, by estimation, is 20%. The left ventricle has severely decreased function. The left ventricle demonstrates global hypokinesis. The left ventricular internal cavity size was moderately dilated. There is  no left ventricular hypertrophy. Left ventricular diastolic parameters are consistent with Grade II diastolic dysfunction (pseudonormalization). Right Ventricle: The right ventricular size is moderately enlarged. No increase in right ventricular wall thickness. Right ventricular systolic function is moderately reduced. There is moderately elevated pulmonary artery systolic pressure. The tricuspid  regurgitant velocity is 3.18 m/s, and with an assumed right atrial pressure of 15 mmHg, the estimated right ventricular systolic pressure is 55.4 mmHg. Left Atrium: Left atrial size was moderately dilated. Right Atrium: Right atrial size was moderately dilated. Pericardium: There is no evidence of pericardial effusion. Mitral Valve: The mitral valve is abnormal. There is mild calcification of the mitral valve leaflet(s). Severe mitral valve regurgitation. No evidence of mitral valve stenosis. Tricuspid Valve: The tricuspid valve is normal in structure. Tricuspid valve regurgitation is mild to moderate. Aortic Valve: The aortic valve is tricuspid. There is mild calcification of the aortic valve. Aortic valve regurgitation is not visualized. No aortic stenosis is present. Aortic valve peak gradient measures 3.0 mmHg. Pulmonic Valve: The pulmonic valve was normal in structure. Pulmonic valve regurgitation is trivial. Aorta: The aortic root is normal in size and structure. Venous: The inferior vena cava is dilated in size with less than 50% respiratory  variability, suggesting right atrial pressure of 15 mmHg. IAS/Shunts: No atrial level shunt detected by color flow Doppler.  LEFT VENTRICLE PLAX 2D LVIDd:         6.50 cm   Diastology LVIDs:         6.10 cm   LV e' medial:    4.45 cm/s LV PW:         0.70 cm   LV E/e' medial:  19.4 LV IVS:        0.80 cm   LV e' lateral:   5.57 cm/s LVOT diam:     2.00 cm   LV E/e' lateral: 15.5 LV SV:         30 LV SV Index:   14 LVOT Area:     3.14 cm  RIGHT VENTRICLE            IVC RV S prime:     7.80 cm/s  IVC diam: 2.70 cm TAPSE (M-mode): 1.6 cm LEFT ATRIUM              Index        RIGHT ATRIUM           Index LA diam:        5.70 cm  2.60 cm/m   RA Area:     24.70 cm LA Vol (A2C):   77.0 ml  35.08 ml/m  RA Volume:   85.60 ml  39.02 ml/m LA Vol (A4C):   113.0 ml 51.51 ml/m LA Biplane Vol: 102.0 ml 46.50 ml/m  AORTIC VALVE                 PULMONIC VALVE AV Area (Vmax): 2.36 cm     PR End Diast Vel: 3.99 msec AV Vmax:        86.90 cm/s AV Peak Grad:   3.0 mmHg LVOT Vmax:      65.23 cm/s LVOT Vmean:     39.133 cm/s LVOT VTI:       0.097 m  AORTA Ao Root diam: 3.20 cm Ao Asc diam:  3.60 cm MITRAL VALVE               TRICUSPID VALVE MV Area (PHT): 4.06 cm    TR Peak grad:   40.4 mmHg MV Decel Time: 187 msec    TR Vmax:        318.00 cm/s MR Peak grad: 64.3 mmHg MR Vmax:      401.00 cm/s  SHUNTS MV E velocity: 86.30 cm/s  Systemic VTI:  0.10 m MV A velocity: 52.20 cm/s  Systemic Diam: 2.00 cm MV E/A ratio:  1.65 Luke McleanMD Electronically signed by Wilfred Lacy Signature Date/Time: 11/22/2022/5:37:50 PM    Final    Medications:  furosemide      amiodarone  200 mg Oral Daily   azaTHIOprine  100 mg Oral Daily   clopidogrel  75 mg Oral Daily   dorzolamide-timolol  1 drop Both Eyes BID   enoxaparin (LOVENOX) injection  30 mg Subcutaneous Q24H   finasteride  5 mg Oral QHS   insulin aspart  0-9 Units Subcutaneous TID WC   levothyroxine  100 mcg Oral Q0600   midodrine  7.5 mg Oral TID WC   multivitamin with  minerals  1 tablet Oral Daily   pantoprazole  40 mg Oral Daily   rosuvastatin  10 mg Oral Daily   tamsulosin  0.4 mg Oral QHS     Assessment/Plan:  AKI/CKD stage IV - in setting of acute on chronic systolic CHF c/w cardiorenal syndrome.  Agree with IV lasix (inc dose today) and midodrine.  Dr. Arrie Aran (his primary neph) has discussed RRT and he and his family are in agreement not to pursue any form of dialysis - confirmed 7/8.  Now plans for discharge with hospice, appreciate palliative care.    Avoid nephrotoxic medications including NSAIDs and iodinated intravenous contrast exposure unless the latter is absolutely indicated.  Preferred narcotic agents for pain control are hydromorphone, fentanyl, and methadone. Morphine should not be used. Avoid Baclofen and avoid oral sodium phosphate and magnesium citrate based laxatives / bowel preps. Continue strict Input and Output monitoring. Will monitor the patient closely with you and intervene or adjust therapy as indicated by changes in clinical status/labs  Acute on  chronic systolic CHF/ICMP - seen by cardiology and AHF team.  Remains overloaded - will give max doses of diuretics today lasix 160 BID + metolazone 10 given prominent symptom is orthopnea and remains oliguric.    Hypotension - on midodrine.  F/b CHF team.  Pauci-immune vasculitis - would d/c imuran at d/c to hospice. CAD - elevated troponin but improving.  Cardiology following. Disposition - poor overall prognosis.  He has had progressive CKD to stage IV since developing cardiorenal syndrome.  Dr. Arrie Aran discussed RRT and are both in agreement not to pursue any form of RRT given his advanced age, multiple co-morbidities, and his poor functional and nutritional status.  Nor would he tolerate it well given his declining cardiac function.   Pall care has seen and pt to discharge with hospice.  I will let Dr. Arrie Aran know.   Please call me if I can further assist.   Estill Bakes  MD 11/23/2022, 8:21 AM  [facility] Pager: [phone]

## 2022-11-23 NOTE — Progress Notes (Signed)
Mobility Specialist Progress Note:   11/23/22 1100  Mobility  Activity Refused mobility  Mobility Specialist Start Time (ACUTE ONLY) 1113  Mobility Specialist Stop Time (ACUTE ONLY) 1116  Mobility Specialist Time Calculation (min) (ACUTE ONLY) 3 min   Pt refused mobility d/t fatigue. Will f/u as able.    D'Vante Earlene Plater Mobility Specialist Please contact via Special educational needs teacher or Rehab office at [phone]

## 2022-11-23 NOTE — Progress Notes (Signed)
PROGRESS NOTE    Luke Mcbride  ZOX:[id number] DOB: Nov 19, 1935 DOA: 11/20/2022 PCP: Patient, No Pcp Per   Brief Narrative:    Luke Mcbride is a 87 y.o. male with medical history significant of coronary artery disease, heart failure, CKD, hypertension, hyperlipidemia, ILD and history of vasculitis who presented with dyspnea.  He reports being at his baseline until 07/04, when he started to experience dyspnea on exertion.  He has been admitted with acute on chronic systolic CHF exacerbation in the setting of CKD stage IV.  Heart failure team as well as nephrology recommending palliative evaluation due to poor prognosis.  He continues to be aggressively diuresed per nephrology/heart failure team. He has agreed to going home with hospice and is awaiting equipment delivery.  Assessment & Plan:   Principal Problem:   Acute on chronic systolic CHF (congestive heart failure) (HCC) Active Problems:   CAD (coronary artery disease)   CKD (chronic kidney disease) stage 4, GFR 15-29 ml/min (HCC)   ILD (interstitial lung disease) (HCC)   Hypothyroidism   Type 2 diabetes mellitus with hyperlipidemia (HCC)   Vasculitis (HCC)  Assessment and Plan:  Acute on chronic systolic CHF (congestive heart failure) (HCC) Echocardiogram with reduced LV systolic function to 35%. Global hypokinesis, mild LVH. RV systolic function with mild reduction, LA and RA with mild dilatation, no significant valvular disease.    Plan to continue diuresis with furosemide 160 mg IV q12 hrs. Midodrine for blood pressure support.  Increased per cardiology to 7.5 mg 3 times daily Metolazone per Nephrology today Holding Jardiance and Toprol-XL Echo ordered and pending   Patient with history of non sustained VT and frequent PVC. Continue with amiodarone 200  mg daily.  Currently not a candidate for ICD per cardiology   CAD (coronary artery disease) No signs of acute coronary syndrome.  Plan to continue clopidogrel and  statin.    CKD (chronic kidney disease) stage 4, GFR 15-29 ml/min (HCC) Reduced GFR with hypervolemia.   Plan to continue diuresis with loop diuretics. Follow up renal function in am, avoid hypotension or nephrotoxic medications.  Not a candidate for RRT per nephrology   ILD (interstitial lung disease) (HCC) Continue with supplemental 02 per [place]. No clinical signs of exacerbation.    Hypothyroidism Continue with levothyroxine.    Type 2 diabetes mellitus with hyperlipidemia (HCC) Add insulin sliding scale for glucose cover and monitoring.  Continue with statin therapy.   Vasculitis (HCC) Continue with azathioprine.  No signs of acute exacerbation.      DVT prophylaxis: Lovenox Code Status: Full Family Communication: Daughter at bedside 7/9 Disposition Plan: Continue ongoing diuresis/palliative consultation Status is: Inpatient Remains inpatient appropriate because: Need for IV medications.  Consultants:  Nephrology Cardiology Palliative care  Procedures:  None  Antimicrobials:  None   Subjective: Patient seen and evaluated today with no new acute complaints or concerns. No acute concerns or events noted overnight. Noted to have a fall to shoulder last night and no acute findings noted. Not much urine output noted. He is awaiting equipment delivery so he can go home with hospice.  Objective: Vitals:   11/22/22 2300 11/23/22 0500 11/23/22 0713 11/23/22 1204  BP: 97/67  (!) 89/66 97/70  Pulse: 76 65 76 84  Resp: 16     Temp: 97.6 F (36.4 C)  98.4 F (36.9 C) 98 F (36.7 C)  TempSrc: Oral  Oral Oral  SpO2: 96% 95%    Weight:  92.3 kg  Height:        Intake/Output Summary (Last 24 hours) at 11/23/2022 1428 Last data filed at 11/23/2022 1417 Gross per 24 hour  Intake 368.06 ml  Output 800 ml  Net -431.94 ml   Filed Weights   11/21/22 0641 11/22/22 0345 11/23/22 0500  Weight: 96.4 kg 94.9 kg 92.3 kg    Examination:  General exam: Appears calm and  comfortable  Respiratory system: Clear to auscultation. Respiratory effort normal. [place] Cardiovascular system: S1 & S2 heard, RRR.  Gastrointestinal system: Abdomen is soft Central nervous system: Alert and awake Extremities: No edema Skin: No significant lesions noted Psychiatry: Flat affect.    Data Reviewed: I have personally reviewed following labs and imaging studies  CBC: Recent Labs  Lab 11/20/22 1941 11/22/22 0009 11/23/22 0010  WBC 6.4 6.0 6.1  NEUTROABS 5.4  --   --   HGB 11.8* 11.2* 10.9*  HCT 36.3* 34.0* 33.7*  MCV 109.7* 109.7* 111.2*  PLT 160 143* 142*   Basic Metabolic Panel: Recent Labs  Lab 11/20/22 1941 11/22/22 0009 11/23/22 0010  NA 137 136 135  K 4.7 4.0 4.1  CL 97* 99 98  CO2 26 23 24   GLUCOSE 141* 104* 165*  BUN 74* 76* 89*  CREATININE 4.20* 4.41* 4.78*  CALCIUM 9.2 8.1* 8.4*  MG  --   --  2.8*   GFR: Estimated Creatinine Clearance: 12.8 mL/min (A) (by C-G formula based on SCr of 4.78 mg/dL (H)). Liver Function Tests: Recent Labs  Lab 11/20/22 1941  AST 22  ALT 23  ALKPHOS 49  BILITOT 0.8  PROT 7.1  ALBUMIN 4.1   No results for input(s): "LIPASE", "AMYLASE" in the last 168 hours. No results for input(s): "AMMONIA" in the last 168 hours. Coagulation Profile: No results for input(s): "INR", "PROTIME" in the last 168 hours. Cardiac Enzymes: No results for input(s): "CKTOTAL", "CKMB", "CKMBINDEX", "TROPONINI" in the last 168 hours. BNP (last 3 results) No results for input(s): "PROBNP" in the last 8760 hours. HbA1C: Recent Labs    11/21/22 1741  HGBA1C 5.7*   CBG: Recent Labs  Lab 11/22/22 1114 11/22/22 1605 11/22/22 2132 11/23/22 0603 11/23/22 1203  GLUCAP 154* 90 141* 120* 147*   Lipid Profile: No results for input(s): "CHOL", "HDL", "LDLCALC", "TRIG", "CHOLHDL", "LDLDIRECT" in the last 72 hours. Thyroid Function Tests: No results for input(s): "TSH", "T4TOTAL", "FREET4", "T3FREE", "THYROIDAB" in the last 72  hours. Anemia Panel: No results for input(s): "VITAMINB12", "FOLATE", "FERRITIN", "TIBC", "IRON", "RETICCTPCT" in the last 72 hours. Sepsis Labs: Recent Labs  Lab 11/21/22 1018  LATICACIDVEN 1.9    Recent Results (from the past 240 hour(s))  Resp panel by RT-PCR (RSV, Flu A&B, Covid) Anterior Nasal Swab     Status: None   Collection Time: 11/20/22  7:41 PM   Specimen: Anterior Nasal Swab  Result Value Ref Range Status   SARS Coronavirus 2 by RT PCR NEGATIVE NEGATIVE Final    Comment: (NOTE) SARS-CoV-2 target nucleic acids are NOT DETECTED.  The SARS-CoV-2 RNA is generally detectable in upper respiratory specimens during the acute phase of infection. The lowest concentration of SARS-CoV-2 viral copies this assay can detect is 138 copies/mL. A negative result does not preclude SARS-Cov-2 infection and should not be used as the sole basis for treatment or other patient management decisions. A negative result may occur with  improper specimen collection/handling, submission of specimen other than nasopharyngeal swab, presence of viral mutation(s) within the areas targeted by this assay, and  inadequate number of viral copies(<138 copies/mL). A negative result must be combined with clinical observations, patient history, and epidemiological information. The expected result is Negative.  Fact Sheet for Patients:  BloggerCourse.com  Fact Sheet for Healthcare Providers:  SeriousBroker.it  This test is no t yet approved or cleared by the [place] FDA and  has been authorized for detection and/or diagnosis of SARS-CoV-2 by FDA under an Emergency Use Authorization (EUA). This EUA will remain  in effect (meaning this test can be used) for the duration of the COVID-19 declaration under Section 564(b)(1) of the Act, 21 U.S.C.section 360bbb-3(b)(1), unless the authorization is terminated  or revoked sooner.       Influenza A by PCR  NEGATIVE NEGATIVE Final   Influenza B by PCR NEGATIVE NEGATIVE Final    Comment: (NOTE) The Xpert Xpress SARS-CoV-2/FLU/RSV plus assay is intended as an aid in the diagnosis of influenza from Nasopharyngeal swab specimens and should not be used as a sole basis for treatment. Nasal washings and aspirates are unacceptable for Xpert Xpress SARS-CoV-2/FLU/RSV testing.  Fact Sheet for Patients: BloggerCourse.com  Fact Sheet for Healthcare Providers: SeriousBroker.it  This test is not yet approved or cleared by the [place] FDA and has been authorized for detection and/or diagnosis of SARS-CoV-2 by FDA under an Emergency Use Authorization (EUA). This EUA will remain in effect (meaning this test can be used) for the duration of the COVID-19 declaration under Section 564(b)(1) of the Act, 21 U.S.C. section 360bbb-3(b)(1), unless the authorization is terminated or revoked.     Resp Syncytial Virus by PCR NEGATIVE NEGATIVE Final    Comment: (NOTE) Fact Sheet for Patients: BloggerCourse.com  Fact Sheet for Healthcare Providers: SeriousBroker.it  This test is not yet approved or cleared by the [place] FDA and has been authorized for detection and/or diagnosis of SARS-CoV-2 by FDA under an Emergency Use Authorization (EUA). This EUA will remain in effect (meaning this test can be used) for the duration of the COVID-19 declaration under Section 564(b)(1) of the Act, 21 U.S.C. section 360bbb-3(b)(1), unless the authorization is terminated or revoked.  Performed at [facility], [address], [place], [place] [address]   MRSA Next Gen by PCR, Nasal     Status: None   Collection Time: 11/21/22  7:00 AM   Specimen: Nasal Mucosa; Nasal Swab  Result Value Ref Range Status   MRSA by PCR Next Gen NOT DETECTED NOT DETECTED Final    Comment: (NOTE) The GeneXpert  MRSA Assay (FDA approved for NASAL specimens only), is one component of a comprehensive MRSA colonization surveillance program. It is not intended to diagnose MRSA infection nor to guide or monitor treatment for MRSA infections. Test performance is not FDA approved in patients less than 68 years old. Performed at [facility], [address] [place], [place] [address]          Radiology Studies: DG Shoulder Right Port  Result Date: 11/23/2022 CLINICAL DATA:  Fall causing right shoulder pain. History of B-cell lymphoma EXAM: RIGHT SHOULDER - 1 VIEW COMPARISON:  Chest radiograph 11/20/2022 FINDINGS: Prior CABG. Atherosclerotic calcification of the aortic arch. Cervical spondylosis. Mild spurring at the [facility] joint. Taking into account the obliquity of the transscapular view, glenohumeral alignment appears normal. Faint bony density tracking over the inferior margin of the right glenoid neck appears to have been present on 05/25/2022 and accordingly is not thought to be an indicator of acute fracture, assessment of this region mildly complicated by  overlying snaps and ECG leads. Suspected subsegmental atelectasis or scarring in the right mid lung. IMPRESSION: 1. No acute fracture or dislocation is identified. 2. Mild spurring at the [facility] joint. 3. Cervical spondylosis. 4. Prior CABG. 5. Suspected subsegmental atelectasis or scarring in the right mid lung. Electronically Signed   By: Gaylyn Rong M.D.   On: 11/23/2022 12:07   ECHOCARDIOGRAM COMPLETE  Result Date: 11/22/2022    ECHOCARDIOGRAM REPORT   Patient Name:   CONLEY DELISLE Date of Exam: 11/22/2022 Medical Rec #:  [id number]       Height:       73.0 in Accession #:    [id number]      Weight:       209.3 lb Date of Birth:  [birth year]        BSA:          2.194 m Patient Age:    87 years        BP:           93/65 mmHg Patient Gender: M               HR:           79 bpm. Exam Location:  Inpatient Procedure: 2D Echo, Cardiac Doppler and  Color Doppler Indications:    CHF-Acute Systolic I50.21  History:        Patient has prior history of Echocardiogram examinations, most                 recent 07/24/2021. CHF, Previous Myocardial Infarction, Angina                 and CAD, Signs/Symptoms:Dyspnea; Risk Factors:Hypertension,                 Diabetes and Dyslipidemia. CKD, stage 4.  Sonographer:    Lucendia Herrlich Referring Phys: [id number] Cyndi Bender IMPRESSIONS  1. Left ventricular ejection fraction, by estimation, is 20%. The left ventricle has severely decreased function. The left ventricle demonstrates global hypokinesis. The left ventricular internal cavity size was moderately dilated. Left ventricular diastolic parameters are consistent with Grade II diastolic dysfunction (pseudonormalization).  2. Right ventricular systolic function is moderately reduced. The right ventricular size is moderately enlarged. There is moderately elevated pulmonary artery systolic pressure. The estimated right ventricular systolic pressure is 55.4 mmHg.  3. Left atrial size was moderately dilated.  4. Right atrial size was moderately dilated.  5. The mitral valve is abnormal. Severe central mitral valve regurgitation, likely functional MR with dilated LV and LA. No evidence of mitral stenosis.  6. Tricuspid valve regurgitation is mild to moderate.  7. The aortic valve is tricuspid. There is mild calcification of the aortic valve. Aortic valve regurgitation is not visualized. No aortic stenosis is present.  8. The inferior vena cava is dilated in size with <50% respiratory variability, suggesting right atrial pressure of 15 mmHg. FINDINGS  Left Ventricle: Left ventricular ejection fraction, by estimation, is 20%. The left ventricle has severely decreased function. The left ventricle demonstrates global hypokinesis. The left ventricular internal cavity size was moderately dilated. There is  no left ventricular hypertrophy. Left ventricular diastolic parameters are  consistent with Grade II diastolic dysfunction (pseudonormalization). Right Ventricle: The right ventricular size is moderately enlarged. No increase in right ventricular wall thickness. Right ventricular systolic function is moderately reduced. There is moderately elevated pulmonary artery systolic pressure. The tricuspid  regurgitant velocity is 3.18 m/s, and with an assumed right atrial pressure of  15 mmHg, the estimated right ventricular systolic pressure is 55.4 mmHg. Left Atrium: Left atrial size was moderately dilated. Right Atrium: Right atrial size was moderately dilated. Pericardium: There is no evidence of pericardial effusion. Mitral Valve: The mitral valve is abnormal. There is mild calcification of the mitral valve leaflet(s). Severe mitral valve regurgitation. No evidence of mitral valve stenosis. Tricuspid Valve: The tricuspid valve is normal in structure. Tricuspid valve regurgitation is mild to moderate. Aortic Valve: The aortic valve is tricuspid. There is mild calcification of the aortic valve. Aortic valve regurgitation is not visualized. No aortic stenosis is present. Aortic valve peak gradient measures 3.0 mmHg. Pulmonic Valve: The pulmonic valve was normal in structure. Pulmonic valve regurgitation is trivial. Aorta: The aortic root is normal in size and structure. Venous: The inferior vena cava is dilated in size with less than 50% respiratory variability, suggesting right atrial pressure of 15 mmHg. IAS/Shunts: No atrial level shunt detected by color flow Doppler.  LEFT VENTRICLE PLAX 2D LVIDd:         6.50 cm   Diastology LVIDs:         6.10 cm   LV e' medial:    4.45 cm/s LV PW:         0.70 cm   LV E/e' medial:  19.4 LV IVS:        0.80 cm   LV e' lateral:   5.57 cm/s LVOT diam:     2.00 cm   LV E/e' lateral: 15.5 LV SV:         30 LV SV Index:   14 LVOT Area:     3.14 cm  RIGHT VENTRICLE            IVC RV S prime:     7.80 cm/s  IVC diam: 2.70 cm TAPSE (M-mode): 1.6 cm LEFT ATRIUM               Index        RIGHT ATRIUM           Index LA diam:        5.70 cm  2.60 cm/m   RA Area:     24.70 cm LA Vol (A2C):   77.0 ml  35.08 ml/m  RA Volume:   85.60 ml  39.02 ml/m LA Vol (A4C):   113.0 ml 51.51 ml/m LA Biplane Vol: 102.0 ml 46.50 ml/m  AORTIC VALVE                 PULMONIC VALVE AV Area (Vmax): 2.36 cm     PR End Diast Vel: 3.99 msec AV Vmax:        86.90 cm/s AV Peak Grad:   3.0 mmHg LVOT Vmax:      65.23 cm/s LVOT Vmean:     39.133 cm/s LVOT VTI:       0.097 m  AORTA Ao Root diam: 3.20 cm Ao Asc diam:  3.60 cm MITRAL VALVE               TRICUSPID VALVE MV Area (PHT): 4.06 cm    TR Peak grad:   40.4 mmHg MV Decel Time: 187 msec    TR Vmax:        318.00 cm/s MR Peak grad: 64.3 mmHg MR Vmax:      401.00 cm/s  SHUNTS MV E velocity: 86.30 cm/s  Systemic VTI:  0.10 m MV A velocity: 52.20 cm/s  Systemic Diam: 2.00 cm MV E/A ratio:  1.65 Dalton McleanMD Electronically signed by Wilfred Lacy Signature Date/Time: 11/22/2022/5:37:50 PM    Final         Scheduled Meds:  amiodarone  200 mg Oral Daily   azaTHIOprine  100 mg Oral Daily   clopidogrel  75 mg Oral Daily   dorzolamide-timolol  1 drop Both Eyes BID   enoxaparin (LOVENOX) injection  30 mg Subcutaneous Q24H   finasteride  5 mg Oral QHS   insulin aspart  0-9 Units Subcutaneous TID WC   levothyroxine  100 mcg Oral Q0600   midodrine  7.5 mg Oral TID WC   multivitamin with minerals  1 tablet Oral Daily   pantoprazole  40 mg Oral Daily   rosuvastatin  10 mg Oral Daily   senna  2 tablet Oral QHS   tamsulosin  0.4 mg Oral QHS     LOS: 2 days    Time spent: 35 minutes    Anayelli Lai Hoover Brunette, DO [facility]  If 7PM-7AM, please contact night-coverage [email or web address] 11/23/2022, 2:28 PM

## 2022-11-23 NOTE — Discharge Summary (Signed)
Physician Discharge Summary  Luke Mcbride ZOX:[id number] DOB: Dec 28, 1935 DOA: 11/20/2022  PCP: Patient, No Pcp Per  Admit date: 11/20/2022  Discharge date: 11/23/2022  Admitted From:Home  Disposition:  Home with hospice  Recommendations for Outpatient Follow-up:  Follow up with home hospice agency  Home Health:With hospice  Equipment/Devices:Hospital bed  Discharge Condition:Stable  CODE STATUS: DNR  Diet recommendation: Heart Healthy  Brief/Interim Summary:  Luke Mcbride is a 87 y.o. male with medical history significant of coronary artery disease, heart failure, CKD, hypertension, hyperlipidemia, ILD and history of vasculitis who presented with dyspnea.  He reports being at his baseline until 07/04, when he started to experience dyspnea on exertion.  He has been admitted with acute on chronic systolic CHF exacerbation in the setting of CKD stage IV.  Heart failure team as well as nephrology recommending palliative evaluation due to poor prognosis.  He was aggressively diuresed per nephrology/heart failure team, but continued to remain symptomatic. He was seen by palliative is now agreeable to home hospice care. No other acute events or concerns noted this admission.  Discharge Diagnoses:  Principal Problem:   Acute on chronic systolic CHF (congestive heart failure) (HCC) Active Problems:   CAD (coronary artery disease)   CKD (chronic kidney disease) stage 4, GFR 15-29 ml/min (HCC)   ILD (interstitial lung disease) (HCC)   Hypothyroidism   Type 2 diabetes mellitus with hyperlipidemia (HCC)   Vasculitis (HCC)  Principal discharge diagnosis: Acute on chronic systolic CHF exacerbation with CKD IV.  Discharge Instructions  Discharge Instructions     Diet - low sodium heart healthy   Complete by: As directed    Diet - low sodium heart healthy   Complete by: As directed    Diet - low sodium heart healthy   Complete by: As directed    Increase activity slowly   Complete  by: As directed    Increase activity slowly   Complete by: As directed    Increase activity slowly   Complete by: As directed       Allergies as of 11/23/2022   No Known Allergies      Medication List     STOP taking these medications    acetaminophen 500 MG tablet Commonly known as: TYLENOL   amiodarone 200 MG tablet Commonly known as: PACERONE   azaTHIOprine 50 MG tablet Commonly known as: IMURAN   clopidogrel 75 MG tablet Commonly known as: PLAVIX   Jardiance 10 MG Tabs tablet Generic drug: empagliflozin   metoprolol succinate 50 MG 24 hr tablet Commonly known as: TOPROL-XL   multivitamin tablet   nitroGLYCERIN 0.4 MG SL tablet Commonly known as: NITROSTAT   torsemide 20 MG tablet Commonly known as: DEMADEX       TAKE these medications    betamethasone dipropionate 0.05 % cream Apply 1 Application topically daily as needed (Irritation).   dorzolamide-timolol 2-0.5 % ophthalmic solution Commonly known as: COSOPT Place 1 drop into both eyes 2 (two) times daily.   finasteride 5 MG tablet Commonly known as: PROSCAR Take 5 mg by mouth at bedtime.   levothyroxine 100 MCG tablet Commonly known as: SYNTHROID Take 100 mcg by mouth daily before breakfast.   LORazepam 2 MG/ML concentrated solution Commonly known as: ATIVAN Take 0.3 mLs (0.6 mg total) by mouth every 4 (four) hours as needed for anxiety.   oxyCODONE 20 MG/ML concentrated solution Commonly known as: ROXICODONE INTENSOL Take 0.3 mLs (6 mg total) by mouth every 2 (two) hours as  needed for moderate pain, severe pain or breakthrough pain (SOB).   pantoprazole 40 MG tablet Commonly known as: PROTONIX TAKE 1 TABLET BY MOUTH  DAILY   rosuvastatin 10 MG tablet Commonly known as: CRESTOR Take 10 mg every morning by mouth.   senna 8.6 MG Tabs tablet Commonly known as: SENOKOT Take 2 tablets (17.2 mg total) by mouth at bedtime.   tamsulosin 0.4 MG Caps capsule Commonly known as:  FLOMAX Take 0.4 mg by mouth at bedtime.               Durable Medical Equipment  (From admission, onward)           Start     Ordered   11/23/22 0908  For home use only DME Hospital bed  Once       Question Answer Comment  Length of Need Lifetime   The above medical condition requires: Patient requires the ability to reposition frequently   Head must be elevated greater than: 30 degrees   Bed type Semi-electric      11/23/22 0908            Follow-up Information     [facility] Follow up.   Why: Home Hospice RN will call to set up admission visit Contact information: Address: [address] [place], [place] [address] Phone: [phone]               No Known Allergies  Consultations: Cardiology Nephrology Palliative   Procedures/Studies: DG Shoulder Right Port  Result Date: 11/23/2022 CLINICAL DATA:  Fall causing right shoulder pain. History of B-cell lymphoma EXAM: RIGHT SHOULDER - 1 VIEW COMPARISON:  Chest radiograph 11/20/2022 FINDINGS: Prior CABG. Atherosclerotic calcification of the aortic arch. Cervical spondylosis. Mild spurring at the [facility] joint. Taking into account the obliquity of the transscapular view, glenohumeral alignment appears normal. Faint bony density tracking over the inferior margin of the right glenoid neck appears to have been present on 05/25/2022 and accordingly is not thought to be an indicator of acute fracture, assessment of this region mildly complicated by overlying snaps and ECG leads. Suspected subsegmental atelectasis or scarring in the right mid lung. IMPRESSION: 1. No acute fracture or dislocation is identified. 2. Mild spurring at the [facility] joint. 3. Cervical spondylosis. 4. Prior CABG. 5. Suspected subsegmental atelectasis or scarring in the right mid lung. Electronically Signed   By: Gaylyn Rong M.D.   On: 11/23/2022 12:07   ECHOCARDIOGRAM COMPLETE  Result Date: 11/22/2022    ECHOCARDIOGRAM REPORT   Patient  Name:   Luke Mcbride Date of Exam: 11/22/2022 Medical Rec #:  [id number]       Height:       73.0 in Accession #:    [id number]      Weight:       209.3 lb Date of Birth:  Jul 24, 1935        BSA:          2.194 m Patient Age:    87 years        BP:           93/65 mmHg Patient Gender: M               HR:           79 bpm. Exam Location:  Inpatient Procedure: 2D Echo, Cardiac Doppler and Color Doppler Indications:    CHF-Acute Systolic I50.21  History:        Patient has prior history of  Echocardiogram examinations, most                 recent 07/24/2021. CHF, Previous Myocardial Infarction, Angina                 and CAD, Signs/Symptoms:Dyspnea; Risk Factors:Hypertension,                 Diabetes and Dyslipidemia. CKD, stage 4.  Sonographer:    Lucendia Herrlich Referring Phys: [id number] Cyndi Bender IMPRESSIONS  1. Left ventricular ejection fraction, by estimation, is 20%. The left ventricle has severely decreased function. The left ventricle demonstrates global hypokinesis. The left ventricular internal cavity size was moderately dilated. Left ventricular diastolic parameters are consistent with Grade II diastolic dysfunction (pseudonormalization).  2. Right ventricular systolic function is moderately reduced. The right ventricular size is moderately enlarged. There is moderately elevated pulmonary artery systolic pressure. The estimated right ventricular systolic pressure is 55.4 mmHg.  3. Left atrial size was moderately dilated.  4. Right atrial size was moderately dilated.  5. The mitral valve is abnormal. Severe central mitral valve regurgitation, likely functional MR with dilated LV and LA. No evidence of mitral stenosis.  6. Tricuspid valve regurgitation is mild to moderate.  7. The aortic valve is tricuspid. There is mild calcification of the aortic valve. Aortic valve regurgitation is not visualized. No aortic stenosis is present.  8. The inferior vena cava is dilated in size with <50% respiratory variability,  suggesting right atrial pressure of 15 mmHg. FINDINGS  Left Ventricle: Left ventricular ejection fraction, by estimation, is 20%. The left ventricle has severely decreased function. The left ventricle demonstrates global hypokinesis. The left ventricular internal cavity size was moderately dilated. There is  no left ventricular hypertrophy. Left ventricular diastolic parameters are consistent with Grade II diastolic dysfunction (pseudonormalization). Right Ventricle: The right ventricular size is moderately enlarged. No increase in right ventricular wall thickness. Right ventricular systolic function is moderately reduced. There is moderately elevated pulmonary artery systolic pressure. The tricuspid  regurgitant velocity is 3.18 m/s, and with an assumed right atrial pressure of 15 mmHg, the estimated right ventricular systolic pressure is 55.4 mmHg. Left Atrium: Left atrial size was moderately dilated. Right Atrium: Right atrial size was moderately dilated. Pericardium: There is no evidence of pericardial effusion. Mitral Valve: The mitral valve is abnormal. There is mild calcification of the mitral valve leaflet(s). Severe mitral valve regurgitation. No evidence of mitral valve stenosis. Tricuspid Valve: The tricuspid valve is normal in structure. Tricuspid valve regurgitation is mild to moderate. Aortic Valve: The aortic valve is tricuspid. There is mild calcification of the aortic valve. Aortic valve regurgitation is not visualized. No aortic stenosis is present. Aortic valve peak gradient measures 3.0 mmHg. Pulmonic Valve: The pulmonic valve was normal in structure. Pulmonic valve regurgitation is trivial. Aorta: The aortic root is normal in size and structure. Venous: The inferior vena cava is dilated in size with less than 50% respiratory variability, suggesting right atrial pressure of 15 mmHg. IAS/Shunts: No atrial level shunt detected by color flow Doppler.  LEFT VENTRICLE PLAX 2D LVIDd:         6.50 cm    Diastology LVIDs:         6.10 cm   LV e' medial:    4.45 cm/s LV PW:         0.70 cm   LV E/e' medial:  19.4 LV IVS:        0.80 cm   LV e' lateral:  5.57 cm/s LVOT diam:     2.00 cm   LV E/e' lateral: 15.5 LV SV:         30 LV SV Index:   14 LVOT Area:     3.14 cm  RIGHT VENTRICLE            IVC RV S prime:     7.80 cm/s  IVC diam: 2.70 cm TAPSE (M-mode): 1.6 cm LEFT ATRIUM              Index        RIGHT ATRIUM           Index LA diam:        5.70 cm  2.60 cm/m   RA Area:     24.70 cm LA Vol (A2C):   77.0 ml  35.08 ml/m  RA Volume:   85.60 ml  39.02 ml/m LA Vol (A4C):   113.0 ml 51.51 ml/m LA Biplane Vol: 102.0 ml 46.50 ml/m  AORTIC VALVE                 PULMONIC VALVE AV Area (Vmax): 2.36 cm     PR End Diast Vel: 3.99 msec AV Vmax:        86.90 cm/s AV Peak Grad:   3.0 mmHg LVOT Vmax:      65.23 cm/s LVOT Vmean:     39.133 cm/s LVOT VTI:       0.097 m  AORTA Ao Root diam: 3.20 cm Ao Asc diam:  3.60 cm MITRAL VALVE               TRICUSPID VALVE MV Area (PHT): 4.06 cm    TR Peak grad:   40.4 mmHg MV Decel Time: 187 msec    TR Vmax:        318.00 cm/s MR Peak grad: 64.3 mmHg MR Vmax:      401.00 cm/s  SHUNTS MV E velocity: 86.30 cm/s  Systemic VTI:  0.10 m MV A velocity: 52.20 cm/s  Systemic Diam: 2.00 cm MV E/A ratio:  1.65 Dalton McleanMD Electronically signed by Wilfred Lacy Signature Date/Time: 11/22/2022/5:37:50 PM    Final    DG Chest Portable 1 View  Result Date: 11/20/2022 CLINICAL DATA:  Cardiomegaly. EXAM: PORTABLE CHEST 1 VIEW COMPARISON:  05/25/2022 FINDINGS: Cardiomegaly status post median sternotomy and CABG. Mild diffuse interstitial opacity. Small, layering pleural effusions. The visualized skeletal structures are unremarkable. IMPRESSION: Cardiomegaly with mild diffuse interstitial opacity and small, layering pleural effusions, consistent with edema. No focal airspace opacity. Electronically Signed   By: Jearld Lesch M.D.   On: 11/20/2022 20:50     Discharge Exam: Vitals:    11/23/22 0713 11/23/22 1204  BP: (!) 89/66 97/70  Pulse: 76 84  Resp:    Temp: 98.4 F (36.9 C) 98 F (36.7 C)  SpO2:     Vitals:   11/22/22 2300 11/23/22 0500 11/23/22 0713 11/23/22 1204  BP: 97/67  (!) 89/66 97/70  Pulse: 76 65 76 84  Resp: 16     Temp: 97.6 F (36.4 C)  98.4 F (36.9 C) 98 F (36.7 C)  TempSrc: Oral  Oral Oral  SpO2: 96% 95%    Weight:  92.3 kg    Height:        General: Pt is alert, awake, not in acute distress Cardiovascular: RRR, S1/S2 +, no rubs, no gallops Respiratory: CTA bilaterally, no wheezing, no rhonchi, [place] Abdominal: Soft, NT, ND, bowel sounds +  Extremities: no edema, no cyanosis    The results of significant diagnostics from this hospitalization (including imaging, microbiology, ancillary and laboratory) are listed below for reference.     Microbiology: Recent Results (from the past 240 hour(s))  Resp panel by RT-PCR (RSV, Flu A&B, Covid) Anterior Nasal Swab     Status: None   Collection Time: 11/20/22  7:41 PM   Specimen: Anterior Nasal Swab  Result Value Ref Range Status   SARS Coronavirus 2 by RT PCR NEGATIVE NEGATIVE Final    Comment: (NOTE) SARS-CoV-2 target nucleic acids are NOT DETECTED.  The SARS-CoV-2 RNA is generally detectable in upper respiratory specimens during the acute phase of infection. The lowest concentration of SARS-CoV-2 viral copies this assay can detect is 138 copies/mL. A negative result does not preclude SARS-Cov-2 infection and should not be used as the sole basis for treatment or other patient management decisions. A negative result may occur with  improper specimen collection/handling, submission of specimen other than nasopharyngeal swab, presence of viral mutation(s) within the areas targeted by this assay, and inadequate number of viral copies(<138 copies/mL). A negative result must be combined with clinical observations, patient history, and epidemiological information. The expected result is  Negative.  Fact Sheet for Patients:  BloggerCourse.com  Fact Sheet for Healthcare Providers:  SeriousBroker.it  This test is no t yet approved or cleared by the [place] FDA and  has been authorized for detection and/or diagnosis of SARS-CoV-2 by FDA under an Emergency Use Authorization (EUA). This EUA will remain  in effect (meaning this test can be used) for the duration of the COVID-19 declaration under Section 564(b)(1) of the Act, 21 U.S.C.section 360bbb-3(b)(1), unless the authorization is terminated  or revoked sooner.       Influenza A by PCR NEGATIVE NEGATIVE Final   Influenza B by PCR NEGATIVE NEGATIVE Final    Comment: (NOTE) The Xpert Xpress SARS-CoV-2/FLU/RSV plus assay is intended as an aid in the diagnosis of influenza from Nasopharyngeal swab specimens and should not be used as a sole basis for treatment. Nasal washings and aspirates are unacceptable for Xpert Xpress SARS-CoV-2/FLU/RSV testing.  Fact Sheet for Patients: BloggerCourse.com  Fact Sheet for Healthcare Providers: SeriousBroker.it  This test is not yet approved or cleared by the [place] FDA and has been authorized for detection and/or diagnosis of SARS-CoV-2 by FDA under an Emergency Use Authorization (EUA). This EUA will remain in effect (meaning this test can be used) for the duration of the COVID-19 declaration under Section 564(b)(1) of the Act, 21 U.S.C. section 360bbb-3(b)(1), unless the authorization is terminated or revoked.     Resp Syncytial Virus by PCR NEGATIVE NEGATIVE Final    Comment: (NOTE) Fact Sheet for Patients: BloggerCourse.com  Fact Sheet for Healthcare Providers: SeriousBroker.it  This test is not yet approved or cleared by the [place] FDA and has been authorized for detection and/or diagnosis of  SARS-CoV-2 by FDA under an Emergency Use Authorization (EUA). This EUA will remain in effect (meaning this test can be used) for the duration of the COVID-19 declaration under Section 564(b)(1) of the Act, 21 U.S.C. section 360bbb-3(b)(1), unless the authorization is terminated or revoked.  Performed at [facility], [address] [place], [place] [address]   MRSA Next Gen by PCR, Nasal     Status: None   Collection Time: 11/21/22  7:00 AM   Specimen: Nasal Mucosa; Nasal Swab  Result Value Ref Range Status   MRSA by PCR Next Gen  NOT DETECTED NOT DETECTED Final    Comment: (NOTE) The GeneXpert MRSA Assay (FDA approved for NASAL specimens only), is one component of a comprehensive MRSA colonization surveillance program. It is not intended to diagnose MRSA infection nor to guide or monitor treatment for MRSA infections. Test performance is not FDA approved in patients less than 40 years old. Performed at [facility], [address], [place], [place] [address]      Labs: BNP (last 3 results) Recent Labs    04/05/22 1115 05/25/22 1752 11/20/22 1941  BNP 220.1* 134.6* 1,144.7*   Basic Metabolic Panel: Recent Labs  Lab 11/20/22 1941 11/22/22 0009 11/23/22 0010  NA 137 136 135  K 4.7 4.0 4.1  CL 97* 99 98  CO2 26 23 24   GLUCOSE 141* 104* 165*  BUN 74* 76* 89*  CREATININE 4.20* 4.41* 4.78*  CALCIUM 9.2 8.1* 8.4*  MG  --   --  2.8*   Liver Function Tests: Recent Labs  Lab 11/20/22 1941  AST 22  ALT 23  ALKPHOS 49  BILITOT 0.8  PROT 7.1  ALBUMIN 4.1   No results for input(s): "LIPASE", "AMYLASE" in the last 168 hours. No results for input(s): "AMMONIA" in the last 168 hours. CBC: Recent Labs  Lab 11/20/22 1941 11/22/22 0009 11/23/22 0010  WBC 6.4 6.0 6.1  NEUTROABS 5.4  --   --   HGB 11.8* 11.2* 10.9*  HCT 36.3* 34.0* 33.7*  MCV 109.7* 109.7* 111.2*  PLT 160 143* 142*   Cardiac Enzymes: No results for input(s): "CKTOTAL",  "CKMB", "CKMBINDEX", "TROPONINI" in the last 168 hours. BNP: Invalid input(s): "POCBNP" CBG: Recent Labs  Lab 11/22/22 1114 11/22/22 1605 11/22/22 2132 11/23/22 0603 11/23/22 1203  GLUCAP 154* 90 141* 120* 147*   D-Dimer No results for input(s): "DDIMER" in the last 72 hours. Hgb A1c Recent Labs    11/21/22 1741  HGBA1C 5.7*   Lipid Profile No results for input(s): "CHOL", "HDL", "LDLCALC", "TRIG", "CHOLHDL", "LDLDIRECT" in the last 72 hours. Thyroid function studies No results for input(s): "TSH", "T4TOTAL", "T3FREE", "THYROIDAB" in the last 72 hours.  Invalid input(s): "FREET3" Anemia work up No results for input(s): "VITAMINB12", "FOLATE", "FERRITIN", "TIBC", "IRON", "RETICCTPCT" in the last 72 hours. Urinalysis    Component Value Date/Time   COLORURINE YELLOW 11/21/2022 1550   APPEARANCEUR CLEAR 11/21/2022 1550   LABSPEC 1.009 11/21/2022 1550   PHURINE 6.0 11/21/2022 1550   GLUCOSEU 150 (A) 11/21/2022 1550   GLUCOSEU NEGATIVE 11/03/2007 1116   HGBUR NEGATIVE 11/21/2022 1550   BILIRUBINUR NEGATIVE 11/21/2022 1550   KETONESUR NEGATIVE 11/21/2022 1550   PROTEINUR NEGATIVE 11/21/2022 1550   UROBILINOGEN 0.2 07/17/2014 1255   NITRITE NEGATIVE 11/21/2022 1550   LEUKOCYTESUR NEGATIVE 11/21/2022 1550   Sepsis Labs Recent Labs  Lab 11/20/22 1941 11/22/22 0009 11/23/22 0010  WBC 6.4 6.0 6.1   Microbiology Recent Results (from the past 240 hour(s))  Resp panel by RT-PCR (RSV, Flu A&B, Covid) Anterior Nasal Swab     Status: None   Collection Time: 11/20/22  7:41 PM   Specimen: Anterior Nasal Swab  Result Value Ref Range Status   SARS Coronavirus 2 by RT PCR NEGATIVE NEGATIVE Final    Comment: (NOTE) SARS-CoV-2 target nucleic acids are NOT DETECTED.  The SARS-CoV-2 RNA is generally detectable in upper respiratory specimens during the acute phase of infection. The lowest concentration of SARS-CoV-2 viral copies this assay can detect is 138 copies/mL. A  negative result does not preclude  SARS-Cov-2 infection and should not be used as the sole basis for treatment or other patient management decisions. A negative result may occur with  improper specimen collection/handling, submission of specimen other than nasopharyngeal swab, presence of viral mutation(s) within the areas targeted by this assay, and inadequate number of viral copies(<138 copies/mL). A negative result must be combined with clinical observations, patient history, and epidemiological information. The expected result is Negative.  Fact Sheet for Patients:  BloggerCourse.com  Fact Sheet for Healthcare Providers:  SeriousBroker.it  This test is no t yet approved or cleared by the [place] FDA and  has been authorized for detection and/or diagnosis of SARS-CoV-2 by FDA under an Emergency Use Authorization (EUA). This EUA will remain  in effect (meaning this test can be used) for the duration of the COVID-19 declaration under Section 564(b)(1) of the Act, 21 U.S.C.section 360bbb-3(b)(1), unless the authorization is terminated  or revoked sooner.       Influenza A by PCR NEGATIVE NEGATIVE Final   Influenza B by PCR NEGATIVE NEGATIVE Final    Comment: (NOTE) The Xpert Xpress SARS-CoV-2/FLU/RSV plus assay is intended as an aid in the diagnosis of influenza from Nasopharyngeal swab specimens and should not be used as a sole basis for treatment. Nasal washings and aspirates are unacceptable for Xpert Xpress SARS-CoV-2/FLU/RSV testing.  Fact Sheet for Patients: BloggerCourse.com  Fact Sheet for Healthcare Providers: SeriousBroker.it  This test is not yet approved or cleared by the [place] FDA and has been authorized for detection and/or diagnosis of SARS-CoV-2 by FDA under an Emergency Use Authorization (EUA). This EUA will remain in effect (meaning this test can  be used) for the duration of the COVID-19 declaration under Section 564(b)(1) of the Act, 21 U.S.C. section 360bbb-3(b)(1), unless the authorization is terminated or revoked.     Resp Syncytial Virus by PCR NEGATIVE NEGATIVE Final    Comment: (NOTE) Fact Sheet for Patients: BloggerCourse.com  Fact Sheet for Healthcare Providers: SeriousBroker.it  This test is not yet approved or cleared by the [place] FDA and has been authorized for detection and/or diagnosis of SARS-CoV-2 by FDA under an Emergency Use Authorization (EUA). This EUA will remain in effect (meaning this test can be used) for the duration of the COVID-19 declaration under Section 564(b)(1) of the Act, 21 U.S.C. section 360bbb-3(b)(1), unless the authorization is terminated or revoked.  Performed at [facility], [address], [place], [place] [address]   MRSA Next Gen by PCR, Nasal     Status: None   Collection Time: 11/21/22  7:00 AM   Specimen: Nasal Mucosa; Nasal Swab  Result Value Ref Range Status   MRSA by PCR Next Gen NOT DETECTED NOT DETECTED Final    Comment: (NOTE) The GeneXpert MRSA Assay (FDA approved for NASAL specimens only), is one component of a comprehensive MRSA colonization surveillance program. It is not intended to diagnose MRSA infection nor to guide or monitor treatment for MRSA infections. Test performance is not FDA approved in patients less than 46 years old. Performed at [facility], [address], [place], [place] [address]      Time coordinating discharge: 35 minutes  SIGNED:   Erick Blinks, DO [facility] 11/23/2022, 3:16 PM  If 7PM-7AM, please contact night-coverage [email or web address]

## 2022-11-23 NOTE — TOC Initial Note (Signed)
Transition of Care [facility]) - Initial/Assessment Note    Patient Details  Name: Luke Mcbride MRN: [id number] Date of Birth: June 30, 1935  Transition of Care [facility]) CM/SW Contact:    Harriet Masson, RN Phone Number: 11/23/2022, 9:26 AM  Clinical Narrative:                 Spoke to patient and daughter, Carollee Herter, at bedside regarding home hospice. Choice offered and Daugther deferred to this RNCM to find highly rated agency.  Notified Victorino Dike with [facility] that Carollee Herter would like to learn more about home hospice.  Victorino Dike will call [facility].  TOC following.   Expected Discharge Plan: Home w Hospice Care Barriers to Discharge: Continued Medical Work up   Patient Goals and CMS Choice            Expected Discharge Plan and Services         Expected Discharge Date: 11/23/22                                    Prior Living Arrangements/Services                       Activities of Daily Living      Permission Sought/Granted                  Emotional Assessment              Admission diagnosis:  CHF (congestive heart failure) (HCC) [I50.9] Elevated troponin [R79.89] Acute on chronic congestive heart failure, unspecified heart failure type (HCC) [I50.9] Acute renal failure superimposed on chronic kidney disease, unspecified acute renal failure type, unspecified CKD stage (HCC) [N17.9, N18.9] Heart failure (HCC) [I50.9] Patient Active Problem List   Diagnosis Date Noted   Heart failure (HCC) 11/21/2022   ILD (interstitial lung disease) (HCC) 11/21/2022   Type 2 diabetes mellitus with hyperlipidemia (HCC) 11/21/2022   Acute on chronic systolic CHF (congestive heart failure) (HCC) 11/20/2022   Acute on chronic heart failure (HCC) 01/29/2018   Weakness    CAD (coronary artery disease) 03/30/2017   Dyspnea on effort    CAD (coronary artery disease) of bypass graft 03/23/2017   Angina pectoris (HCC) 03/23/2017   Immunosuppressed due to  chemotherapy [facility])    Acute diverticulitis 10/30/2016   Diverticulitis of colon with perforation 10/30/2016   Counseling regarding advanced care planning and goals of care 09/16/2016   Port catheter in place 09/15/2016   Acute lower UTI 09/02/2016   Pancytopenia (HCC) 09/02/2016   Neutropenia with fever (HCC) 09/02/2016   Sepsis (HCC) 09/02/2016   Chemotherapy-induced neutropenia (HCC)    Diffuse large B cell lymphoma (HCC) 08/07/2016   S/P small bowel resection 07/12/2016   Spinal stenosis 07/08/2015   CKD (chronic kidney disease) stage 4, GFR 15-29 ml/min (HCC) 07/29/2012   NSTEMI (non-ST elevated myocardial infarction) (HCC) 07/29/2012   Vasculitis (HCC)    ALLERGIC RHINITIS 07/16/2010   HYPERCHOLESTEROLEMIA 12/04/2008   ANEMIA 12/04/2008   IRITIS 12/04/2008   RENAL INSUFFICIENCY 12/04/2008   Coronary atherosclerosis 07/23/2008   GERD 04/15/2008   Barrett's esophagus 04/15/2008   HYPERLIPIDEMIA 07/20/2007   Essential hypertension, benign 05/23/2007   FOREIGN BODY, SOFT TISSUE, RESIDUAL 04/24/2007   INTERSTITIAL LUNG DISEASE 02/05/2007   RESTLESS LEG SYNDROME, SEVERE 01/26/2007   NEPHRITIS, MEMBRANOPROLIFERATIVE 01/26/2007   Hypothyroidism 11/30/2006   DIABETES MELLITUS, TYPE II 11/30/2006   Myocardial  infarction (HCC) 05/17/2000   PCP:  Patient, No Pcp Per Pharmacy:   [facility] [phone] [place], [place] - [address] [place] [address] Phone: [phone] Fax: [phone]  [facility]) - [place], [place] - [address] [facility] [address] [place] [address] [place] [address] Phone: [phone] Fax: [phone]  [facility] - [place], [place] - [address] [place] [address] Phone: [phone] Fax: [phone]  [facility] [address] [place] [address] Phone: [phone] Fax: [phone]     Social  Determinants of Health (SDOH) Social History: SDOH Screenings   Food Insecurity: No Food Insecurity (02/04/2022)  Housing: Low Risk  (02/04/2022)  Transportation Needs: No Transportation Needs (02/04/2022)  Utilities: Not At Risk (02/04/2022)  Financial Resource Strain: Low Risk  (02/04/2022)  Tobacco Use: Low Risk  (11/20/2022)   SDOH Interventions:     Readmission Risk Interventions     No data to display

## 2022-11-23 NOTE — Progress Notes (Signed)
Advanced Heart Failure Rounding Note  PCP-Cardiologist: Charlton Haws, MD   Subjective:    Interval Hx  Repeat TTE yesterday w/ LVEF 15%, severe MR and RV dysfunction.  Renal fx worsening, SCr continues to rise 4.41>>4.78. BUN 89. Only 250 cc in UOP despite high dose IV diuretics yesterday    Palliative care team met w/ patient and family. Made DNR. Plan is for home hospice   Didn't sleep well last night. SOB. Couldn't get comfortable. Feels slightly better this morning sitting up in chair. On 3L [place]. O2 sats 96%. Daughters present at bedside.   Objective:   Weight Range: 92.3 kg Body mass index is 26.85 kg/m.   Vital Signs:   Temp:  [97.6 F (36.4 C)-98.4 F (36.9 C)] 98.4 F (36.9 C) (07/09 0713) Pulse Rate:  [65-85] 76 (07/09 0713) Resp:  [15-20] 16 (07/08 2300) BP: (79-98)/(59-75) 89/66 (07/09 0713) SpO2:  [92 %-98 %] 95 % (07/09 0500) Weight:  [92.3 kg] 92.3 kg (07/09 0500)   Weight change: Filed Weights   11/21/22 0641 11/22/22 0345 11/23/22 0500  Weight: 96.4 kg 94.9 kg 92.3 kg   Intake/Output:   Intake/Output Summary (Last 24 hours) at 11/23/2022 0735 Last data filed at 11/23/2022 0350 Gross per 24 hour  Intake 782.06 ml  Output 250 ml  Net 532.06 ml    Physical Exam   General:  elderly, fatigued and chronically ill appearing. No respiratory difficulty HEENT: normal Neck: supple. JVD elevated to jaw. Carotids 2+ bilat; no bruits. No lymphadenopathy or thyromegaly appreciated. Cor: PMI nondisplaced. Regular rate & rhythm. 3/6 MR murmur  Lungs: decreased BS at the bases, faint bibasilar crackles  Abdomen: soft, nontender, nondistended. No hepatosplenomegaly. No bruits or masses. Good bowel sounds. Extremities: no cyanosis, clubbing, rash, 2+ b/l Le edema + unna boots  Neuro: alert & oriented x 3, cranial nerves grossly intact. moves all 4 extremities w/o difficulty. Affect pleasant.  Telemetry   NSR 80s  EKG    No new EKG to review  Labs     CBC Recent Labs    11/20/22 1941 11/22/22 0009 11/23/22 0010  WBC 6.4 6.0 6.1  NEUTROABS 5.4  --   --   HGB 11.8* 11.2* 10.9*  HCT 36.3* 34.0* 33.7*  MCV 109.7* 109.7* 111.2*  PLT 160 143* 142*   Basic Metabolic Panel Recent Labs    16/10/96 0009 11/23/22 0010  NA 136 135  K 4.0 4.1  CL 99 98  CO2 23 24  GLUCOSE 104* 165*  BUN 76* 89*  CREATININE 4.41* 4.78*  CALCIUM 8.1* 8.4*  MG  --  2.8*   Liver Function Tests Recent Labs    11/20/22 1941  AST 22  ALT 23  ALKPHOS 49  BILITOT 0.8  PROT 7.1  ALBUMIN 4.1   No results for input(s): "LIPASE", "AMYLASE" in the last 72 hours. Cardiac Enzymes No results for input(s): "CKTOTAL", "CKMB", "CKMBINDEX", "TROPONINI" in the last 72 hours.  BNP: BNP (last 3 results) Recent Labs    04/05/22 1115 05/25/22 1752 11/20/22 1941  BNP 220.1* 134.6* 1,144.7*    ProBNP (last 3 results) No results for input(s): "PROBNP" in the last 8760 hours.   D-Dimer No results for input(s): "DDIMER" in the last 72 hours. Hemoglobin A1C Recent Labs    11/21/22 1741  HGBA1C 5.7*   Fasting Lipid Panel No results for input(s): "CHOL", "HDL", "LDLCALC", "TRIG", "CHOLHDL", "LDLDIRECT" in the last 72 hours. Thyroid Function Tests No results for input(s): "  TSH", "T4TOTAL", "T3FREE", "THYROIDAB" in the last 72 hours.  Invalid input(s): "FREET3"  Other results:   Imaging    ECHOCARDIOGRAM COMPLETE  Result Date: 11/22/2022    ECHOCARDIOGRAM REPORT   Patient Name:   Luke Mcbride Date of Exam: 11/22/2022 Medical Rec #:  [id number]       Height:       73.0 in Accession #:    [id number]      Weight:       209.3 lb Date of Birth:  [birth year]        BSA:          2.194 m Patient Age:    87 years        BP:           93/65 mmHg Patient Gender: M               HR:           79 bpm. Exam Location:  Inpatient Procedure: 2D Echo, Cardiac Doppler and Color Doppler Indications:    CHF-Acute Systolic I50.21  History:        Patient has prior  history of Echocardiogram examinations, most                 recent 07/24/2021. CHF, Previous Myocardial Infarction, Angina                 and CAD, Signs/Symptoms:Dyspnea; Risk Factors:Hypertension,                 Diabetes and Dyslipidemia. CKD, stage 4.  Sonographer:    Lucendia Herrlich Referring Phys: [id number] Cyndi Bender IMPRESSIONS  1. Left ventricular ejection fraction, by estimation, is 20%. The left ventricle has severely decreased function. The left ventricle demonstrates global hypokinesis. The left ventricular internal cavity size was moderately dilated. Left ventricular diastolic parameters are consistent with Grade II diastolic dysfunction (pseudonormalization).  2. Right ventricular systolic function is moderately reduced. The right ventricular size is moderately enlarged. There is moderately elevated pulmonary artery systolic pressure. The estimated right ventricular systolic pressure is 55.4 mmHg.  3. Left atrial size was moderately dilated.  4. Right atrial size was moderately dilated.  5. The mitral valve is abnormal. Severe central mitral valve regurgitation, likely functional MR with dilated LV and LA. No evidence of mitral stenosis.  6. Tricuspid valve regurgitation is mild to moderate.  7. The aortic valve is tricuspid. There is mild calcification of the aortic valve. Aortic valve regurgitation is not visualized. No aortic stenosis is present.  8. The inferior vena cava is dilated in size with <50% respiratory variability, suggesting right atrial pressure of 15 mmHg. FINDINGS  Left Ventricle: Left ventricular ejection fraction, by estimation, is 20%. The left ventricle has severely decreased function. The left ventricle demonstrates global hypokinesis. The left ventricular internal cavity size was moderately dilated. There is  no left ventricular hypertrophy. Left ventricular diastolic parameters are consistent with Grade II diastolic dysfunction (pseudonormalization). Right Ventricle: The right  ventricular size is moderately enlarged. No increase in right ventricular wall thickness. Right ventricular systolic function is moderately reduced. There is moderately elevated pulmonary artery systolic pressure. The tricuspid  regurgitant velocity is 3.18 m/s, and with an assumed right atrial pressure of 15 mmHg, the estimated right ventricular systolic pressure is 55.4 mmHg. Left Atrium: Left atrial size was moderately dilated. Right Atrium: Right atrial size was moderately dilated. Pericardium: There is no evidence of pericardial effusion. Mitral Valve: The mitral valve is abnormal.  There is mild calcification of the mitral valve leaflet(s). Severe mitral valve regurgitation. No evidence of mitral valve stenosis. Tricuspid Valve: The tricuspid valve is normal in structure. Tricuspid valve regurgitation is mild to moderate. Aortic Valve: The aortic valve is tricuspid. There is mild calcification of the aortic valve. Aortic valve regurgitation is not visualized. No aortic stenosis is present. Aortic valve peak gradient measures 3.0 mmHg. Pulmonic Valve: The pulmonic valve was normal in structure. Pulmonic valve regurgitation is trivial. Aorta: The aortic root is normal in size and structure. Venous: The inferior vena cava is dilated in size with less than 50% respiratory variability, suggesting right atrial pressure of 15 mmHg. IAS/Shunts: No atrial level shunt detected by color flow Doppler.  LEFT VENTRICLE PLAX 2D LVIDd:         6.50 cm   Diastology LVIDs:         6.10 cm   LV e' medial:    4.45 cm/s LV PW:         0.70 cm   LV E/e' medial:  19.4 LV IVS:        0.80 cm   LV e' lateral:   5.57 cm/s LVOT diam:     2.00 cm   LV E/e' lateral: 15.5 LV SV:         30 LV SV Index:   14 LVOT Area:     3.14 cm  RIGHT VENTRICLE            IVC RV S prime:     7.80 cm/s  IVC diam: 2.70 cm TAPSE (M-mode): 1.6 cm LEFT ATRIUM              Index        RIGHT ATRIUM           Index LA diam:        5.70 cm  2.60 cm/m   RA Area:      24.70 cm LA Vol (A2C):   77.0 ml  35.08 ml/m  RA Volume:   85.60 ml  39.02 ml/m LA Vol (A4C):   113.0 ml 51.51 ml/m LA Biplane Vol: 102.0 ml 46.50 ml/m  AORTIC VALVE                 PULMONIC VALVE AV Area (Vmax): 2.36 cm     PR End Diast Vel: 3.99 msec AV Vmax:        86.90 cm/s AV Peak Grad:   3.0 mmHg LVOT Vmax:      65.23 cm/s LVOT Vmean:     39.133 cm/s LVOT VTI:       0.097 m  AORTA Ao Root diam: 3.20 cm Ao Asc diam:  3.60 cm MITRAL VALVE               TRICUSPID VALVE MV Area (PHT): 4.06 cm    TR Peak grad:   40.4 mmHg MV Decel Time: 187 msec    TR Vmax:        318.00 cm/s MR Peak grad: 64.3 mmHg MR Vmax:      401.00 cm/s  SHUNTS MV E velocity: 86.30 cm/s  Systemic VTI:  0.10 m MV A velocity: 52.20 cm/s  Systemic Diam: 2.00 cm MV E/A ratio:  1.65 Dalton McleanMD Electronically signed by Wilfred Lacy Signature Date/Time: 11/22/2022/5:37:50 PM    Final      Medications:     Scheduled Medications:  amiodarone  200 mg Oral Daily   azaTHIOprine  100 mg  Oral Daily   clopidogrel  75 mg Oral Daily   dorzolamide-timolol  1 drop Both Eyes BID   enoxaparin (LOVENOX) injection  30 mg Subcutaneous Q24H   finasteride  5 mg Oral QHS   insulin aspart  0-9 Units Subcutaneous TID WC   levothyroxine  100 mcg Oral Q0600   midodrine  7.5 mg Oral TID WC   multivitamin with minerals  1 tablet Oral Daily   pantoprazole  40 mg Oral Daily   rosuvastatin  10 mg Oral Daily   tamsulosin  0.4 mg Oral QHS    Infusions:  furosemide Stopped (11/22/22 1850)    PRN Medications: acetaminophen **OR** acetaminophen, melatonin, nitroGLYCERIN, ondansetron **OR** ondansetron (ZOFRAN) IV, mouth rinse  Patient Profile   Luke Mcbride is a 87 y.o. male with history of CAD, ischemic cardiomyopathy, BOOP, pauci-immune vasculitis with CKD stage 4, and diffuse large B cell lymphoma. AHF team to see for CHF exacerbation.   Assessment/Plan  1. Acute on chronic systolic CHF: Ischemic cardiomyopathy.  Echo 3/23  with EF 35%, mildly decreased RV systolic function.  CHF has been complicated by CKD stage 4 which limits GDMT. He was admitted with worsened creatinine to 4.2 and NYHA class IIIb symptoms. Echo this admit w/ LVEF 15%, severe MR and RV dysfunction. He is end-stage HF w/ progressive cardiorenal syndrome. He is unfortunately not a candidate for advanced therapies due to renal failure, age and comorbid conditions. We have been unsuccessful w/ diuresis. He remains markedly volume overloaded w/ NYHA Class IV symptoms. Palliative care team now following. Pt has opted to transition to home hospice care. Now DNR.    - Plan to d/c w/ home hospice support. Palliative care team to arrange  2. CAD: S/p CABG 1/02 with subsequent PCI to seq SVG-PDA/PLV and later to native LAD.  He has rare atypical chest pain.  HS-TnI mildly elevated with no trend (774 => 680).  Suspect demand ischemia with volume overload +AKI.    - Continue Plavix 75 daily.  - Continue Crestor 10 mg daily.  3. AKI on CKD stage 4: From pauci-immune vasculitis with glomerulonephritis, now c/b cardiorenal syndrome. Creatinine up to 4.8 today  - Think dialysis would be difficult for him with age, low BP, and poor functional status.  - Dr. Arrie Aran following - Now planing for hospice  4. PVCs: Zio in 7/23 with 11% PVCs and 6 NSVT runs.  - Continue amiodarone 200 mg daily.   5. PAD: 3/24 evaluation with abnormal TBIs, normal ABIs.  6. GOC - limited options at this point. Suspect he would not tolerate HD. Per nephrology and patient no plants for RRT.  - planning home w/ hospice per above    Length of Stay: 2  Robbie Lis, PA-C  11/23/2022, 7:35 AM  Advanced Heart Failure Team Pager [phone] (M-F; 7a - 5p)  Please contact [facility] for night-coverage after hours (5p -7a ) and weekends on [email or web address]

## 2022-11-23 NOTE — Plan of Care (Signed)

## 2022-11-23 NOTE — Progress Notes (Signed)
Daily Progress Note   Patient Name: Luke Mcbride       Date: 11/23/2022 DOB: 14-Jan-1936  Age: 87 y.o. MRN#: [id number] Attending Physician: Erick Blinks, DO Primary Care Physician: Patient, No Pcp Per Admit Date: 11/20/2022  Reason for Consultation/Follow-up: Establishing goals of care  Patient Profile/HPI:  87 y.o. male  with past medical history of CAD, heart failure, HTN, HLD, ILD, vasculitis admitted on 11/20/2022 with shortness of breath. Admitted and being treated for acute on chronic heart failure exacerbation (diuresing), acute on chronic kidney injury (Cr trending up- 4.41 today)- not a candidate for RRT, cardiorenal syndrome. Palliative medicine consulted for GOC.   Subjective: Chart reviewed- Cr is continuing to rise. Dr. Glenna Fellows discussed this with patient.  Met at bedside with patient and his two daughters. They are preparing to go home with hospice.  We discussed symptom management. I reviewed use of oxycodone liquid and lorazepam for symptoms. Also discussed bowel regimen for opioid constipation prophylaxis.   Review of Systems  Constitutional:  Positive for malaise/fatigue.  Respiratory:  Positive for shortness of breath.      Physical Exam Vitals and nursing note reviewed.  Constitutional:      Appearance: He is ill-appearing.  Pulmonary:     Effort: Pulmonary effort is normal.  Neurological:     Mental Status: He is oriented to person, place, and time.             Vital Signs: BP (!) 89/66 (BP Location: Left Arm)   Pulse 76   Temp 98.4 F (36.9 C) (Oral)   Resp 16   Ht 6\' 1"  (1.854 m)   Wt 92.3 kg Comment: Simultaneous filing. User may not have seen previous data.  SpO2 95%   BMI 26.85 kg/m  SpO2: SpO2: 95 % O2 Device: O2 Device: Nasal Cannula O2 Flow  Rate: O2 Flow Rate (L/min): 3 L/min  Intake/output summary:  Intake/Output Summary (Last 24 hours) at 11/23/2022 1037 Last data filed at 11/23/2022 0919 Gross per 24 hour  Intake 542.06 ml  Output 300 ml  Net 242.06 ml   LBM:   Baseline Weight: Weight: 90.7 kg Most recent weight: Weight: 92.3 kg (Simultaneous filing. User may not have seen previous data.)       Palliative Assessment/Data: PPS: 40%  Patient Active Problem List   Diagnosis Date Noted   Heart failure (HCC) 11/21/2022   ILD (interstitial lung disease) (HCC) 11/21/2022   Type 2 diabetes mellitus with hyperlipidemia (HCC) 11/21/2022   Acute on chronic systolic CHF (congestive heart failure) (HCC) 11/20/2022   Acute on chronic heart failure (HCC) 01/29/2018   Weakness    CAD (coronary artery disease) 03/30/2017   Dyspnea on effort    CAD (coronary artery disease) of bypass graft 03/23/2017   Angina pectoris (HCC) 03/23/2017   Immunosuppressed due to chemotherapy [facility])    Acute diverticulitis 10/30/2016   Diverticulitis of colon with perforation 10/30/2016   Counseling regarding advanced care planning and goals of care 09/16/2016   Port catheter in place 09/15/2016   Acute lower UTI 09/02/2016   Pancytopenia (HCC) 09/02/2016   Neutropenia with fever (HCC) 09/02/2016   Sepsis (HCC) 09/02/2016   Chemotherapy-induced neutropenia (HCC)    Diffuse large B cell lymphoma (HCC) 08/07/2016   S/P small bowel resection 07/12/2016   Spinal stenosis 07/08/2015   CKD (chronic kidney disease) stage 4, GFR 15-29 ml/min (HCC) 07/29/2012   NSTEMI (non-ST elevated myocardial infarction) (HCC) 07/29/2012   Vasculitis (HCC)    ALLERGIC RHINITIS 07/16/2010   HYPERCHOLESTEROLEMIA 12/04/2008   ANEMIA 12/04/2008   IRITIS 12/04/2008   RENAL INSUFFICIENCY 12/04/2008   Coronary atherosclerosis 07/23/2008   GERD 04/15/2008   Barrett's esophagus 04/15/2008   HYPERLIPIDEMIA 07/20/2007   Essential hypertension, benign 05/23/2007    FOREIGN BODY, SOFT TISSUE, RESIDUAL 04/24/2007   INTERSTITIAL LUNG DISEASE 02/05/2007   RESTLESS LEG SYNDROME, SEVERE 01/26/2007   NEPHRITIS, MEMBRANOPROLIFERATIVE 01/26/2007   Hypothyroidism 11/30/2006   DIABETES MELLITUS, TYPE II 11/30/2006   Myocardial infarction [facility]) 05/17/2000    Palliative Care Assessment & Plan    Assessment/Recommendations/Plan  Plan for home with hospice On discharge, would recommend scripts for: - Oxycodone Concentrate 20mg /20ml: 5mg  (0.68ml) sublingual every 1 hour as needed for pain or shortness of breath: Disp 30ml - Lorazepam 2mg /ml concentrated solution: 1mg  (0.21ml) sublingual every 4 hours as needed for anxiety: Disp 30ml - Haldol 2mg /ml solution: 0.5mg  (0.60ml) sublingual every 4 hours as needed for agitation or nausea: Disp 30ml  Senna 2 po nightly   Code Status: DNR  Prognosis:  < 4 weeks  Discharge Planning: Home with Hospice  Care plan was discussed with patient, family and care team  Thank you for allowing the Palliative Medicine Team to assist in the care of this patient.  Greater than 50%  of this time was spent counseling and coordinating care related to the above assessment and plan.  Ocie Bob, AGNP-C Palliative Medicine   Please contact Palliative Medicine Team phone at [phone] for questions and concerns.       On discharge, would recommend scripts for: - Oxycodone Concentrate 20mg /73ml: 5mg  (0.71ml) sublingual every 1 hour as needed for pain or shortness of breath: Disp 30ml - Lorazepam 2mg /ml concentrated solution: 1mg  (0.25ml) sublingual every 4 hours as needed for anxiety: Disp 30ml - Haldol 2mg /ml solution: 0.5mg  (0.46ml) sublingual every 4 hours as needed for agitation or nausea: Disp 30ml  med

## 2022-11-23 NOTE — TOC Initial Note (Addendum)
Transition of Care [facility]) - Initial/Assessment Note    Patient Details  Name: Luke Mcbride MRN: [id number] Date of Birth: [birth year]  Transition of Care [facility]) CM/SW Contact:    Elliot Cousin, RN Phone Number: [phone] 11/23/2022, 12:34 PM  Clinical Narrative:   HF TOC CM spoke to pt's dtr, Susie and states they are waiting on DME from [facility]. Will need ambulance transportation home. DNR form and PTAR paperwork on chart.    404 pm Contacted PTAR. DME has been delivered to home.                Expected Discharge Plan: Home w Hospice Care Barriers to Discharge: No Barriers Identified   Patient Goals and CMS Choice Patient states their goals for this hospitalization and ongoing recovery are:: wants to be comfortable CMS Medicare.gov Compare Post Acute Care list provided to:: Patient Represenative (must comment) Choice offered to / list presented to : Adult Children      Expected Discharge Plan and Services   Discharge Planning Services: CM Consult Post Acute Care Choice: Hospice Living arrangements for the past 2 months: Single Family Home Expected Discharge Date: 11/23/22                         HH Arranged: RN HH Agency:  ([facility]) Date [facility] Agency Contacted: 11/23/22 Time HH Agency Contacted: [phone] Representative spoke with at [facility] Agency: Victorino Dike  Prior Living Arrangements/Services Living arrangements for the past 2 months: Single Family Home   Patient language and need for interpreter reviewed:: Yes Do you feel safe going back to the place where you live?: Yes      Need for Family Participation in Patient Care: Yes (Comment) Care giver support system in place?: Yes (comment)   Criminal Activity/Legal Involvement Pertinent to Current Situation/Hospitalization: No - Comment as needed  Activities of Daily Living      Permission Sought/Granted Permission sought to share information with : Case Manager, Family Supports,  PCP Permission granted to share information with : Yes, Verbal Permission Granted  Share Information with NAME: [name]  Permission granted to share info w AGENCY: Home Hospice  Permission granted to share info w Relationship: daughter  Permission granted to share info w Contact Information: [phone]  Emotional Assessment           Psych Involvement: No (comment)  Admission diagnosis:  CHF (congestive heart failure) (HCC) [I50.9] Elevated troponin [R79.89] Acute on chronic congestive heart failure, unspecified heart failure type (HCC) [I50.9] Acute renal failure superimposed on chronic kidney disease, unspecified acute renal failure type, unspecified CKD stage (HCC) [N17.9, N18.9] Heart failure (HCC) [I50.9] Patient Active Problem List   Diagnosis Date Noted   Heart failure (HCC) 11/21/2022   ILD (interstitial lung disease) (HCC) 11/21/2022   Type 2 diabetes mellitus with hyperlipidemia (HCC) 11/21/2022   Acute on chronic systolic CHF (congestive heart failure) (HCC) 11/20/2022   Acute on chronic heart failure (HCC) 01/29/2018   Weakness    CAD (coronary artery disease) 03/30/2017   Dyspnea on effort    CAD (coronary artery disease) of bypass graft 03/23/2017   Angina pectoris (HCC) 03/23/2017   Immunosuppressed due to chemotherapy [facility])    Acute diverticulitis 10/30/2016   Diverticulitis of colon with perforation 10/30/2016   Counseling regarding advanced care planning and goals of care 09/16/2016   Port catheter in place 09/15/2016   Acute lower UTI 09/02/2016   Pancytopenia (HCC)  09/02/2016   Neutropenia with fever (HCC) 09/02/2016   Sepsis (HCC) 09/02/2016   Chemotherapy-induced neutropenia (HCC)    Diffuse large B cell lymphoma (HCC) 08/07/2016   S/P small bowel resection 07/12/2016   Spinal stenosis 07/08/2015   CKD (chronic kidney disease) stage 4, GFR 15-29 ml/min (HCC) 07/29/2012   NSTEMI (non-ST elevated myocardial infarction) (HCC) 07/29/2012    Vasculitis (HCC)    ALLERGIC RHINITIS 07/16/2010   HYPERCHOLESTEROLEMIA 12/04/2008   ANEMIA 12/04/2008   IRITIS 12/04/2008   RENAL INSUFFICIENCY 12/04/2008   Coronary atherosclerosis 07/23/2008   GERD 04/15/2008   Barrett's esophagus 04/15/2008   HYPERLIPIDEMIA 07/20/2007   Essential hypertension, benign 05/23/2007   FOREIGN BODY, SOFT TISSUE, RESIDUAL 04/24/2007   INTERSTITIAL LUNG DISEASE 02/05/2007   RESTLESS LEG SYNDROME, SEVERE 01/26/2007   NEPHRITIS, MEMBRANOPROLIFERATIVE 01/26/2007   Hypothyroidism 11/30/2006   DIABETES MELLITUS, TYPE II 11/30/2006   Myocardial infarction (HCC) 05/17/2000   PCP:  Patient, No Pcp Per Pharmacy:   [id number] Judithann Sheen, [place] - [address] [place] [address] Phone: [phone] Fax: [phone]  [facility] - [place], [place] - [address] [place] [address] Phone: [phone] Fax: [phone]  [facility] - [place] - [address] [place]  [address] Phone: [phone] Fax: [phone]  [place] [facility] [address] [place] [address] Phone: [phone] Fax: [phone]     Social Determinants of Health (SDOH) Social History: SDOH Screenings   Food Insecurity: No Food Insecurity (02/04/2022)  Housing: Low Risk  (02/04/2022)  Transportation Needs: No Transportation Needs (02/04/2022)  Utilities: Not At Risk (02/04/2022)  Financial Resource Strain: Low Risk  (02/04/2022)  Tobacco Use: Low Risk  (11/20/2022)   SDOH Interventions:     Readmission Risk Interventions     No data to display

## 2022-11-23 NOTE — Progress Notes (Signed)
Pt daughter relayed to me that she and the NT was helping pt stand with the walker to urinate and he fell onto the soft part of the bed- not the rails- and did not hit the floor- but now his right shoulder is bothering him a lot. I paged Dr. Julian Reil with this information and gave pt Tylenol and a hot pack to place on the area-  Shoulder x-ray was ordered.

## 2022-11-23 NOTE — Progress Notes (Signed)
Pharmacist Heart Failure Core Measure Documentation  Assessment: Luke Mcbride has an EF documented as 20% on 11/22/22 by Dr. Shirlee Latch.  Rationale: Heart failure patients with left ventricular systolic dysfunction (LVSD) and an EF < 40% should be prescribed an angiotensin converting enzyme inhibitor (ACEI) or angiotensin receptor blocker (ARB) at discharge unless a contraindication is documented in the medical record.  This patient is not currently on an ACEI or ARB for HF.  This note is being placed in the record in order to provide documentation that a contraindication to the use of these agents is present for this encounter.  ACE Inhibitor or Angiotensin Receptor Blocker is contraindicated (specify all that apply)  []   ACEI allergy AND ARB allergy []   Angioedema []   Moderate or severe aortic stenosis []   Hyperkalemia []   Hypotension []   Renal artery stenosis [x]   Worsening renal function, preexisting renal disease or dysfunction   Lennie Muckle, PharmD PGY1 Acute Care Resident 11/23/2022 11:48 AM

## 2022-11-24 DIAGNOSIS — E785 Hyperlipidemia, unspecified: Secondary | ICD-10-CM | POA: Diagnosis not present

## 2022-11-24 DIAGNOSIS — N189 Chronic kidney disease, unspecified: Secondary | ICD-10-CM | POA: Diagnosis not present

## 2022-11-24 DIAGNOSIS — Z9861 Coronary angioplasty status: Secondary | ICD-10-CM | POA: Diagnosis not present

## 2022-11-24 DIAGNOSIS — Z515 Encounter for palliative care: Secondary | ICD-10-CM | POA: Diagnosis not present

## 2022-11-24 DIAGNOSIS — Z66 Do not resuscitate: Secondary | ICD-10-CM | POA: Diagnosis not present

## 2022-11-24 DIAGNOSIS — Z9049 Acquired absence of other specified parts of digestive tract: Secondary | ICD-10-CM | POA: Diagnosis not present

## 2022-11-24 DIAGNOSIS — I5033 Acute on chronic diastolic (congestive) heart failure: Secondary | ICD-10-CM | POA: Diagnosis not present

## 2022-11-24 DIAGNOSIS — L950 Livedoid vasculitis: Secondary | ICD-10-CM | POA: Diagnosis not present

## 2022-11-24 DIAGNOSIS — I130 Hypertensive heart and chronic kidney disease with heart failure and stage 1 through stage 4 chronic kidney disease, or unspecified chronic kidney disease: Secondary | ICD-10-CM | POA: Diagnosis not present

## 2022-11-25 DIAGNOSIS — Z9861 Coronary angioplasty status: Secondary | ICD-10-CM | POA: Diagnosis not present

## 2022-11-25 DIAGNOSIS — I5033 Acute on chronic diastolic (congestive) heart failure: Secondary | ICD-10-CM | POA: Diagnosis not present

## 2022-11-25 DIAGNOSIS — N189 Chronic kidney disease, unspecified: Secondary | ICD-10-CM | POA: Diagnosis not present

## 2022-11-25 DIAGNOSIS — E785 Hyperlipidemia, unspecified: Secondary | ICD-10-CM | POA: Diagnosis not present

## 2022-11-25 DIAGNOSIS — Z9049 Acquired absence of other specified parts of digestive tract: Secondary | ICD-10-CM | POA: Diagnosis not present

## 2022-11-25 DIAGNOSIS — I130 Hypertensive heart and chronic kidney disease with heart failure and stage 1 through stage 4 chronic kidney disease, or unspecified chronic kidney disease: Secondary | ICD-10-CM | POA: Diagnosis not present

## 2022-11-26 DIAGNOSIS — Z9049 Acquired absence of other specified parts of digestive tract: Secondary | ICD-10-CM | POA: Diagnosis not present

## 2022-11-26 DIAGNOSIS — N189 Chronic kidney disease, unspecified: Secondary | ICD-10-CM | POA: Diagnosis not present

## 2022-11-26 DIAGNOSIS — I5033 Acute on chronic diastolic (congestive) heart failure: Secondary | ICD-10-CM | POA: Diagnosis not present

## 2022-11-26 DIAGNOSIS — Z9861 Coronary angioplasty status: Secondary | ICD-10-CM | POA: Diagnosis not present

## 2022-11-26 DIAGNOSIS — E785 Hyperlipidemia, unspecified: Secondary | ICD-10-CM | POA: Diagnosis not present

## 2022-11-26 DIAGNOSIS — I130 Hypertensive heart and chronic kidney disease with heart failure and stage 1 through stage 4 chronic kidney disease, or unspecified chronic kidney disease: Secondary | ICD-10-CM | POA: Diagnosis not present

## 2022-11-27 DIAGNOSIS — E785 Hyperlipidemia, unspecified: Secondary | ICD-10-CM | POA: Diagnosis not present

## 2022-11-27 DIAGNOSIS — Z9861 Coronary angioplasty status: Secondary | ICD-10-CM | POA: Diagnosis not present

## 2022-11-27 DIAGNOSIS — I130 Hypertensive heart and chronic kidney disease with heart failure and stage 1 through stage 4 chronic kidney disease, or unspecified chronic kidney disease: Secondary | ICD-10-CM | POA: Diagnosis not present

## 2022-11-27 DIAGNOSIS — I5033 Acute on chronic diastolic (congestive) heart failure: Secondary | ICD-10-CM | POA: Diagnosis not present

## 2022-11-27 DIAGNOSIS — N189 Chronic kidney disease, unspecified: Secondary | ICD-10-CM | POA: Diagnosis not present

## 2022-11-27 DIAGNOSIS — Z9049 Acquired absence of other specified parts of digestive tract: Secondary | ICD-10-CM | POA: Diagnosis not present

## 2022-12-06 ENCOUNTER — Telehealth (HOSPITAL_COMMUNITY): Payer: Self-pay | Admitting: Cardiology

## 2022-12-06 NOTE — Telephone Encounter (Signed)
Patients wife called to report pt passed away last week. Chart updated and message to provider as Lorain Childes

## 2022-12-16 DEATH — deceased

## 2022-12-17 ENCOUNTER — Encounter (HOSPITAL_COMMUNITY): Payer: Medicare Other | Admitting: Cardiology

## 2022-12-19 ENCOUNTER — Other Ambulatory Visit: Payer: Self-pay | Admitting: Cardiovascular Disease

## 2023-03-14 ENCOUNTER — Ambulatory Visit: Payer: Medicare Other | Admitting: Hematology

## 2023-03-14 ENCOUNTER — Other Ambulatory Visit: Payer: Medicare Other
